# Patient Record
Sex: Female | Born: 1935 | Race: White | Hispanic: No | Smoking: Never smoker
Health system: Southern US, Community
[De-identification: ages and names within clinical notes are randomized; demographics above are authoritative.]

## PROBLEM LIST (undated history)

## (undated) DIAGNOSIS — Z87442 Personal history of urinary calculi: Secondary | ICD-10-CM

## (undated) DIAGNOSIS — R29898 Other symptoms and signs involving the musculoskeletal system: Secondary | ICD-10-CM

## (undated) DIAGNOSIS — H9193 Unspecified hearing loss, bilateral: Secondary | ICD-10-CM

## (undated) DIAGNOSIS — R2681 Unsteadiness on feet: Secondary | ICD-10-CM

## (undated) DIAGNOSIS — I639 Cerebral infarction, unspecified: Secondary | ICD-10-CM

## (undated) DIAGNOSIS — C44621 Squamous cell carcinoma of skin of unspecified upper limb, including shoulder: Secondary | ICD-10-CM

## (undated) DIAGNOSIS — S22070A Wedge compression fracture of T9-T10 vertebra, initial encounter for closed fracture: Secondary | ICD-10-CM

## (undated) DIAGNOSIS — I2510 Atherosclerotic heart disease of native coronary artery without angina pectoris: Secondary | ICD-10-CM

## (undated) DIAGNOSIS — K219 Gastro-esophageal reflux disease without esophagitis: Secondary | ICD-10-CM

## (undated) DIAGNOSIS — S72001A Fracture of unspecified part of neck of right femur, initial encounter for closed fracture: Secondary | ICD-10-CM

## (undated) DIAGNOSIS — M48061 Spinal stenosis, lumbar region without neurogenic claudication: Secondary | ICD-10-CM

## (undated) DIAGNOSIS — J309 Allergic rhinitis, unspecified: Secondary | ICD-10-CM

## (undated) DIAGNOSIS — N189 Chronic kidney disease, unspecified: Secondary | ICD-10-CM

## (undated) DIAGNOSIS — H353190 Nonexudative age-related macular degeneration, unspecified eye, stage unspecified: Secondary | ICD-10-CM

## (undated) DIAGNOSIS — I5030 Unspecified diastolic (congestive) heart failure: Secondary | ICD-10-CM

## (undated) DIAGNOSIS — R0689 Other abnormalities of breathing: Secondary | ICD-10-CM

## (undated) DIAGNOSIS — E785 Hyperlipidemia, unspecified: Secondary | ICD-10-CM

## (undated) DIAGNOSIS — Z8719 Personal history of other diseases of the digestive system: Secondary | ICD-10-CM

## (undated) DIAGNOSIS — R55 Syncope and collapse: Secondary | ICD-10-CM

## (undated) DIAGNOSIS — G250 Essential tremor: Secondary | ICD-10-CM

## (undated) DIAGNOSIS — Z8619 Personal history of other infectious and parasitic diseases: Secondary | ICD-10-CM

## (undated) DIAGNOSIS — B0230 Zoster ocular disease, unspecified: Secondary | ICD-10-CM

## (undated) DIAGNOSIS — I10 Essential (primary) hypertension: Secondary | ICD-10-CM

## (undated) HISTORY — DX: Other abnormalities of breathing: R06.89

## (undated) HISTORY — DX: Atherosclerotic heart disease of native coronary artery without angina pectoris: I25.10

## (undated) HISTORY — DX: Squamous cell carcinoma of skin of unspecified upper limb, including shoulder: C44.621

## (undated) HISTORY — DX: Gastro-esophageal reflux disease without esophagitis: K21.9

## (undated) HISTORY — PX: CORNEAL TRANSPLANT: SHX108

## (undated) HISTORY — DX: Essential (primary) hypertension: I10

## (undated) HISTORY — DX: Wedge compression fracture of t9-t10 vertebra, initial encounter for closed fracture: S22.070A

## (undated) HISTORY — DX: Personal history of urinary calculi: Z87.442

## (undated) HISTORY — DX: Fracture of unspecified part of neck of right femur, initial encounter for closed fracture: S72.001A

## (undated) HISTORY — DX: Essential tremor: G25.0

## (undated) HISTORY — DX: Unspecified diastolic (congestive) heart failure: I50.30

## (undated) HISTORY — DX: Syncope and collapse: R55

## (undated) HISTORY — DX: Cerebral infarction, unspecified: I63.9

## (undated) HISTORY — DX: Unsteadiness on feet: R26.81

## (undated) HISTORY — DX: Unspecified hearing loss, bilateral: H91.93

## (undated) HISTORY — DX: Hyperlipidemia, unspecified: E78.5

## (undated) HISTORY — DX: Allergic rhinitis, unspecified: J30.9

## (undated) HISTORY — DX: Nonexudative age-related macular degeneration, unspecified eye, stage unspecified: H35.3190

## (undated) HISTORY — DX: Spinal stenosis, lumbar region without neurogenic claudication: M48.061

## (undated) HISTORY — DX: Other symptoms and signs involving the musculoskeletal system: R29.898

## (undated) HISTORY — PX: CATARACT EXTRACTION: SUR2

## (undated) HISTORY — DX: Chronic kidney disease, unspecified: N18.9

## (undated) HISTORY — DX: Personal history of other infectious and parasitic diseases: Z86.19

## (undated) HISTORY — DX: Personal history of other diseases of the digestive system: Z87.19

## (undated) HISTORY — DX: Zoster ocular disease, unspecified: B02.30

---
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
## 1983-02-02 HISTORY — PX: CHOLECYSTECTOMY: SHX55

## 1997-12-23 ENCOUNTER — Other Ambulatory Visit: Admission: RE | Admit: 1997-12-23 | Discharge: 1997-12-23 | Payer: Self-pay | Admitting: Gynecology

## 1999-01-19 ENCOUNTER — Other Ambulatory Visit: Admission: RE | Admit: 1999-01-19 | Discharge: 1999-01-19 | Payer: Self-pay | Admitting: Gynecology

## 1999-01-27 ENCOUNTER — Encounter: Admission: RE | Admit: 1999-01-27 | Discharge: 1999-01-27 | Payer: Self-pay | Admitting: Gynecology

## 1999-01-27 ENCOUNTER — Encounter: Payer: Self-pay | Admitting: Gynecology

## 1999-02-02 HISTORY — PX: BREAST BIOPSY: SHX20

## 1999-03-09 ENCOUNTER — Encounter (INDEPENDENT_AMBULATORY_CARE_PROVIDER_SITE_OTHER): Payer: Self-pay | Admitting: Specialist

## 1999-03-09 ENCOUNTER — Ambulatory Visit (HOSPITAL_COMMUNITY): Admission: RE | Admit: 1999-03-09 | Discharge: 1999-03-09 | Payer: Self-pay | Admitting: Surgery

## 1999-03-09 ENCOUNTER — Encounter: Payer: Self-pay | Admitting: Surgery

## 2000-02-02 HISTORY — PX: CERVICAL FUSION: SHX112

## 2000-02-08 ENCOUNTER — Other Ambulatory Visit: Admission: RE | Admit: 2000-02-08 | Discharge: 2000-02-08 | Payer: Self-pay | Admitting: Gynecology

## 2000-04-22 ENCOUNTER — Encounter: Admission: RE | Admit: 2000-04-22 | Discharge: 2000-04-22 | Payer: Self-pay | Admitting: Gynecology

## 2000-04-22 ENCOUNTER — Encounter: Payer: Self-pay | Admitting: Gynecology

## 2000-08-25 ENCOUNTER — Encounter: Payer: Self-pay | Admitting: Neurosurgery

## 2000-08-25 ENCOUNTER — Inpatient Hospital Stay (HOSPITAL_COMMUNITY): Admission: RE | Admit: 2000-08-25 | Discharge: 2000-08-27 | Payer: Self-pay | Admitting: Neurosurgery

## 2000-09-01 ENCOUNTER — Encounter: Admission: RE | Admit: 2000-09-01 | Discharge: 2000-09-01 | Payer: Self-pay | Admitting: Neurosurgery

## 2000-09-01 ENCOUNTER — Encounter: Payer: Self-pay | Admitting: Neurosurgery

## 2001-02-15 ENCOUNTER — Other Ambulatory Visit: Admission: RE | Admit: 2001-02-15 | Discharge: 2001-02-15 | Payer: Self-pay | Admitting: Gynecology

## 2001-05-15 ENCOUNTER — Encounter: Admission: RE | Admit: 2001-05-15 | Discharge: 2001-05-15 | Payer: Self-pay | Admitting: Gynecology

## 2001-05-15 ENCOUNTER — Encounter: Payer: Self-pay | Admitting: Gynecology

## 2001-07-25 ENCOUNTER — Encounter: Payer: Self-pay | Admitting: Gynecology

## 2001-07-25 ENCOUNTER — Encounter: Admission: RE | Admit: 2001-07-25 | Discharge: 2001-07-25 | Payer: Self-pay | Admitting: Gynecology

## 2002-02-01 DIAGNOSIS — K219 Gastro-esophageal reflux disease without esophagitis: Secondary | ICD-10-CM

## 2002-02-01 HISTORY — PX: CORONARY ARTERY BYPASS GRAFT: SHX141

## 2002-02-01 HISTORY — DX: Gastro-esophageal reflux disease without esophagitis: K21.9

## 2002-03-12 ENCOUNTER — Encounter: Admission: RE | Admit: 2002-03-12 | Discharge: 2002-03-12 | Payer: Self-pay | Admitting: Gynecology

## 2002-03-12 ENCOUNTER — Encounter: Payer: Self-pay | Admitting: Gynecology

## 2002-05-02 ENCOUNTER — Ambulatory Visit (HOSPITAL_COMMUNITY): Admission: RE | Admit: 2002-05-02 | Discharge: 2002-05-02 | Payer: Self-pay | Admitting: Cardiology

## 2002-05-21 ENCOUNTER — Inpatient Hospital Stay (HOSPITAL_COMMUNITY): Admission: EM | Admit: 2002-05-21 | Discharge: 2002-05-22 | Payer: Self-pay | Admitting: Emergency Medicine

## 2002-05-21 ENCOUNTER — Encounter: Payer: Self-pay | Admitting: Cardiology

## 2002-08-20 ENCOUNTER — Encounter (HOSPITAL_COMMUNITY): Admission: RE | Admit: 2002-08-20 | Discharge: 2002-11-18 | Payer: Self-pay | Admitting: Cardiology

## 2002-09-12 ENCOUNTER — Encounter: Payer: Self-pay | Admitting: Endocrinology

## 2002-09-12 ENCOUNTER — Encounter: Admission: RE | Admit: 2002-09-12 | Discharge: 2002-09-12 | Payer: Self-pay | Admitting: Endocrinology

## 2003-01-04 ENCOUNTER — Ambulatory Visit (HOSPITAL_COMMUNITY): Admission: RE | Admit: 2003-01-04 | Discharge: 2003-01-05 | Payer: Self-pay | Admitting: Cardiology

## 2003-02-02 DIAGNOSIS — R55 Syncope and collapse: Secondary | ICD-10-CM

## 2003-02-02 HISTORY — PX: OTHER SURGICAL HISTORY: SHX169

## 2003-02-02 HISTORY — DX: Syncope and collapse: R55

## 2003-02-02 HISTORY — PX: PERCUTANEOUS CORONARY STENT INTERVENTION (PCI-S): SHX6016

## 2003-02-28 ENCOUNTER — Other Ambulatory Visit: Admission: RE | Admit: 2003-02-28 | Discharge: 2003-02-28 | Payer: Self-pay | Admitting: Gynecology

## 2003-03-01 ENCOUNTER — Ambulatory Visit (HOSPITAL_COMMUNITY): Admission: RE | Admit: 2003-03-01 | Discharge: 2003-03-01 | Payer: Self-pay | Admitting: Cardiology

## 2003-03-02 ENCOUNTER — Inpatient Hospital Stay (HOSPITAL_COMMUNITY): Admission: EM | Admit: 2003-03-02 | Discharge: 2003-03-05 | Payer: Self-pay | Admitting: Emergency Medicine

## 2003-03-13 ENCOUNTER — Encounter: Admission: RE | Admit: 2003-03-13 | Discharge: 2003-03-13 | Payer: Self-pay | Admitting: Gynecology

## 2003-04-04 ENCOUNTER — Encounter: Admission: RE | Admit: 2003-04-04 | Discharge: 2003-05-16 | Payer: Self-pay | Admitting: Neurology

## 2003-08-09 ENCOUNTER — Emergency Department (HOSPITAL_COMMUNITY): Admission: EM | Admit: 2003-08-09 | Discharge: 2003-08-09 | Payer: Self-pay | Admitting: Family Medicine

## 2003-10-10 ENCOUNTER — Emergency Department (HOSPITAL_COMMUNITY): Admission: EM | Admit: 2003-10-10 | Discharge: 2003-10-10 | Payer: Self-pay | Admitting: Family Medicine

## 2004-01-02 ENCOUNTER — Encounter: Admission: RE | Admit: 2004-01-02 | Discharge: 2004-01-02 | Payer: Self-pay | Admitting: Cardiology

## 2004-01-08 ENCOUNTER — Ambulatory Visit (HOSPITAL_COMMUNITY): Admission: RE | Admit: 2004-01-08 | Discharge: 2004-01-09 | Payer: Self-pay | Admitting: Cardiology

## 2004-02-20 ENCOUNTER — Emergency Department (HOSPITAL_COMMUNITY): Admission: EM | Admit: 2004-02-20 | Discharge: 2004-02-21 | Payer: Self-pay

## 2004-03-04 ENCOUNTER — Encounter (HOSPITAL_COMMUNITY): Admission: RE | Admit: 2004-03-04 | Discharge: 2004-06-02 | Payer: Self-pay | Admitting: Cardiology

## 2004-05-19 ENCOUNTER — Encounter: Admission: RE | Admit: 2004-05-19 | Discharge: 2004-05-19 | Payer: Self-pay | Admitting: Gynecology

## 2004-06-03 ENCOUNTER — Encounter (HOSPITAL_COMMUNITY): Admission: RE | Admit: 2004-06-03 | Discharge: 2004-09-01 | Payer: Self-pay | Admitting: Cardiology

## 2004-06-15 ENCOUNTER — Inpatient Hospital Stay (HOSPITAL_COMMUNITY): Admission: EM | Admit: 2004-06-15 | Discharge: 2004-06-16 | Payer: Self-pay | Admitting: Emergency Medicine

## 2004-09-02 ENCOUNTER — Encounter (HOSPITAL_COMMUNITY): Admission: RE | Admit: 2004-09-02 | Discharge: 2004-12-01 | Payer: Self-pay | Admitting: Cardiology

## 2004-10-11 ENCOUNTER — Emergency Department (HOSPITAL_COMMUNITY): Admission: EM | Admit: 2004-10-11 | Discharge: 2004-10-11 | Payer: Self-pay | Admitting: Family Medicine

## 2004-12-02 ENCOUNTER — Encounter (HOSPITAL_COMMUNITY): Admission: RE | Admit: 2004-12-02 | Discharge: 2005-03-02 | Payer: Self-pay | Admitting: Cardiology

## 2005-06-03 ENCOUNTER — Encounter: Admission: RE | Admit: 2005-06-03 | Discharge: 2005-06-03 | Payer: Self-pay | Admitting: Endocrinology

## 2005-09-11 ENCOUNTER — Emergency Department (HOSPITAL_COMMUNITY): Admission: EM | Admit: 2005-09-11 | Discharge: 2005-09-11 | Payer: Self-pay | Admitting: Family Medicine

## 2006-03-28 ENCOUNTER — Encounter: Admission: RE | Admit: 2006-03-28 | Discharge: 2006-03-28 | Payer: Self-pay | Admitting: Endocrinology

## 2006-04-11 ENCOUNTER — Encounter (INDEPENDENT_AMBULATORY_CARE_PROVIDER_SITE_OTHER): Payer: Self-pay | Admitting: *Deleted

## 2006-04-11 ENCOUNTER — Ambulatory Visit (HOSPITAL_BASED_OUTPATIENT_CLINIC_OR_DEPARTMENT_OTHER): Admission: RE | Admit: 2006-04-11 | Discharge: 2006-04-11 | Payer: Self-pay | Admitting: Surgery

## 2006-07-26 ENCOUNTER — Encounter: Admission: RE | Admit: 2006-07-26 | Discharge: 2006-07-26 | Payer: Self-pay | Admitting: Obstetrics and Gynecology

## 2006-09-07 ENCOUNTER — Encounter: Admission: RE | Admit: 2006-09-07 | Discharge: 2006-09-07 | Payer: Self-pay | Admitting: Neurosurgery

## 2006-10-02 ENCOUNTER — Encounter: Admission: RE | Admit: 2006-10-02 | Discharge: 2006-10-02 | Payer: Self-pay | Admitting: Neurosurgery

## 2007-02-02 DIAGNOSIS — B0230 Zoster ocular disease, unspecified: Secondary | ICD-10-CM

## 2007-02-02 HISTORY — DX: Zoster ocular disease, unspecified: B02.30

## 2007-02-02 HISTORY — PX: CARDIAC CATHETERIZATION: SHX172

## 2007-08-14 ENCOUNTER — Encounter: Admission: RE | Admit: 2007-08-14 | Discharge: 2007-08-14 | Payer: Self-pay | Admitting: Endocrinology

## 2007-08-22 ENCOUNTER — Encounter: Admission: RE | Admit: 2007-08-22 | Discharge: 2007-08-22 | Payer: Self-pay | Admitting: Endocrinology

## 2007-10-23 ENCOUNTER — Encounter: Payer: Self-pay | Admitting: Cardiovascular Disease

## 2008-02-02 HISTORY — PX: LUMBAR SPINE SURGERY: SHX701

## 2008-02-02 HISTORY — PX: CARDIOVASCULAR STRESS TEST: SHX262

## 2008-03-11 ENCOUNTER — Encounter: Admission: RE | Admit: 2008-03-11 | Discharge: 2008-03-11 | Payer: Self-pay | Admitting: Neurosurgery

## 2008-05-24 ENCOUNTER — Inpatient Hospital Stay (HOSPITAL_COMMUNITY): Admission: RE | Admit: 2008-05-24 | Discharge: 2008-05-28 | Payer: Self-pay | Admitting: Neurosurgery

## 2008-09-07 ENCOUNTER — Emergency Department: Payer: Self-pay | Admitting: Unknown Physician Specialty

## 2009-02-01 DIAGNOSIS — R0689 Other abnormalities of breathing: Secondary | ICD-10-CM

## 2009-02-01 HISTORY — DX: Other abnormalities of breathing: R06.89

## 2009-05-05 LAB — VITAMIN D 25 HYDROXY (VIT D DEFICIENCY, FRACTURES): Vit D, 25-Hydroxy: 39.1

## 2009-07-04 ENCOUNTER — Encounter: Admission: RE | Admit: 2009-07-04 | Discharge: 2009-07-04 | Payer: Self-pay | Admitting: Endocrinology

## 2009-09-10 ENCOUNTER — Encounter: Payer: Self-pay | Admitting: Cardiovascular Disease

## 2009-09-11 ENCOUNTER — Ambulatory Visit (HOSPITAL_COMMUNITY): Admission: RE | Admit: 2009-09-11 | Discharge: 2009-09-11 | Payer: Self-pay | Admitting: Cardiology

## 2009-09-11 ENCOUNTER — Ambulatory Visit: Payer: Self-pay | Admitting: Cardiology

## 2009-09-15 ENCOUNTER — Telehealth (INDEPENDENT_AMBULATORY_CARE_PROVIDER_SITE_OTHER): Payer: Self-pay | Admitting: *Deleted

## 2009-09-16 ENCOUNTER — Ambulatory Visit: Payer: Self-pay

## 2009-09-16 ENCOUNTER — Encounter: Payer: Self-pay | Admitting: Internal Medicine

## 2009-09-16 ENCOUNTER — Encounter (HOSPITAL_COMMUNITY): Admission: RE | Admit: 2009-09-16 | Discharge: 2009-10-20 | Payer: Self-pay | Admitting: Cardiology

## 2009-09-16 ENCOUNTER — Ambulatory Visit: Payer: Self-pay | Admitting: Internal Medicine

## 2009-09-16 ENCOUNTER — Encounter: Payer: Self-pay | Admitting: Cardiovascular Disease

## 2009-09-23 ENCOUNTER — Ambulatory Visit: Payer: Self-pay | Admitting: Cardiology

## 2009-10-03 ENCOUNTER — Ambulatory Visit: Payer: Self-pay | Admitting: Emergency Medicine

## 2009-10-03 DIAGNOSIS — E1121 Type 2 diabetes mellitus with diabetic nephropathy: Secondary | ICD-10-CM

## 2009-10-03 DIAGNOSIS — R0602 Shortness of breath: Secondary | ICD-10-CM

## 2009-10-03 DIAGNOSIS — I10 Essential (primary) hypertension: Secondary | ICD-10-CM | POA: Insufficient documentation

## 2009-10-09 ENCOUNTER — Encounter: Payer: Self-pay | Admitting: Emergency Medicine

## 2009-10-09 ENCOUNTER — Ambulatory Visit (HOSPITAL_COMMUNITY): Admission: RE | Admit: 2009-10-09 | Discharge: 2009-10-09 | Payer: Self-pay | Admitting: Emergency Medicine

## 2009-10-10 ENCOUNTER — Telehealth (INDEPENDENT_AMBULATORY_CARE_PROVIDER_SITE_OTHER): Payer: Self-pay | Admitting: *Deleted

## 2009-10-30 ENCOUNTER — Ambulatory Visit: Payer: Self-pay | Admitting: Emergency Medicine

## 2009-10-30 DIAGNOSIS — Z8673 Personal history of transient ischemic attack (TIA), and cerebral infarction without residual deficits: Secondary | ICD-10-CM

## 2009-11-03 ENCOUNTER — Encounter: Payer: Self-pay | Admitting: Emergency Medicine

## 2009-11-03 ENCOUNTER — Ambulatory Visit: Payer: Self-pay | Admitting: Cardiology

## 2009-11-03 LAB — HEMOGLOBIN A1C: Hgb A1c MFr Bld: 6.7 % — AB (ref 4.0–6.0)

## 2009-11-10 ENCOUNTER — Telehealth (INDEPENDENT_AMBULATORY_CARE_PROVIDER_SITE_OTHER): Payer: Self-pay | Admitting: *Deleted

## 2009-11-24 ENCOUNTER — Telehealth (INDEPENDENT_AMBULATORY_CARE_PROVIDER_SITE_OTHER): Payer: Self-pay | Admitting: *Deleted

## 2009-11-25 ENCOUNTER — Encounter: Payer: Self-pay | Admitting: Emergency Medicine

## 2009-12-03 ENCOUNTER — Encounter: Payer: Self-pay | Admitting: Emergency Medicine

## 2009-12-10 ENCOUNTER — Ambulatory Visit: Payer: Self-pay | Admitting: Cardiology

## 2009-12-10 ENCOUNTER — Encounter: Payer: Self-pay | Admitting: Cardiovascular Disease

## 2009-12-11 ENCOUNTER — Ambulatory Visit: Payer: Self-pay | Admitting: Emergency Medicine

## 2009-12-11 DIAGNOSIS — R0902 Hypoxemia: Secondary | ICD-10-CM

## 2009-12-16 ENCOUNTER — Encounter: Payer: Self-pay | Admitting: Emergency Medicine

## 2009-12-17 ENCOUNTER — Telehealth: Payer: Self-pay | Admitting: Emergency Medicine

## 2010-01-02 ENCOUNTER — Telehealth (INDEPENDENT_AMBULATORY_CARE_PROVIDER_SITE_OTHER): Payer: Self-pay | Admitting: *Deleted

## 2010-01-07 ENCOUNTER — Encounter: Payer: Self-pay | Admitting: Emergency Medicine

## 2010-01-07 LAB — CBC AND DIFFERENTIAL
Hemoglobin: 12 g/dL (ref 12.0–16.0)
MCV: 94.1
Platelets: 255 10*3/uL
WBC: 6.9

## 2010-01-07 LAB — COMPREHENSIVE METABOLIC PANEL
AST: 15 U/L
Albumin: 4
Alkaline Phosphatase: 40 U/L
Glucose: 92
Potassium, serum: 4
Total bilirubin, fluid: 0.6

## 2010-01-07 LAB — LIPID PANEL
Direct LDL: 55
HDL: 70 mg/dL (ref 35–70)

## 2010-01-29 ENCOUNTER — Telehealth (INDEPENDENT_AMBULATORY_CARE_PROVIDER_SITE_OTHER): Payer: Self-pay | Admitting: *Deleted

## 2010-02-10 ENCOUNTER — Encounter: Payer: Self-pay | Admitting: Emergency Medicine

## 2010-03-02 ENCOUNTER — Ambulatory Visit
Admission: RE | Admit: 2010-03-02 | Discharge: 2010-03-02 | Payer: Self-pay | Source: Home / Self Care | Attending: Cardiovascular Disease | Admitting: Cardiovascular Disease

## 2010-03-02 ENCOUNTER — Encounter: Payer: Self-pay | Admitting: Cardiovascular Disease

## 2010-03-02 DIAGNOSIS — E785 Hyperlipidemia, unspecified: Secondary | ICD-10-CM | POA: Insufficient documentation

## 2010-03-03 ENCOUNTER — Telehealth: Payer: Self-pay | Admitting: Emergency Medicine

## 2010-03-03 NOTE — Progress Notes (Signed)
Summary: liquid O2 rx  Phone Note From Other Clinic Call back at [phone]   Caller: casey[facility] Call For: byrum Summary of Call: Needs rx for liquid O2. Initial call taken by: Darletta Moll,  November 10, 2009 10:41 AM  Follow-up for Phone Call        called and spoke with [facility] from [facility]. They are requesting a rx from RB for Liquid o2 and portable o2, and nasal canulas.  Also requests liter flow documented in rx.  RB sent order to [facility] 10/30/2009 but this just stated for o2 set up.  Will forward message to RB to address.  Aundra Millet Reynolds LPN  November 10, 2009 10:59 AM   Additional Follow-up for Phone Call Additional follow up Details #1::        Order for liquid, portable O2 sent. Leslye Peer MD  November 11, 2009 12:48 PM  Additional Follow-up by: Leslye Peer MD,  November 11, 2009 12:48 PM    Additional Follow-up for Phone Call Additional follow up Details #2::    Order faxed to [facility] .Kandice Hams [facility]  November 11, 2009 2:17 PM  Follow-up by: Kandice Hams CMA,  November 11, 2009 2:17 PM

## 2010-03-03 NOTE — Progress Notes (Signed)
Summary: ONO results from [facility]--  Phone Note Call from Patient Call back at [facility] Phone [phone]   Caller: Patient Call For: Edder Bellanca Summary of Call: ONO results Initial call taken by: Tivis Ringer, CNA,  December 17, 2009 10:55 AM  Follow-up for Phone Call        Do we have these results available? Leslye Peer MD  December 18, 2009 12:56 PM   Pt has changed DME companies and is now with [facility]. I spoke with them and they are faxing ONO results to Dr. Delton Coombes.Michel Bickers [facility]  December 18, 2009 2:13 PM  Results receivedand placed in RB look at folder. Follow-up by: Michel Bickers CMA,  December 18, 2009 2:53 PM

## 2010-03-03 NOTE — Progress Notes (Signed)
Summary: appt w/ RB  Phone Note Call from Patient Call back at Home Phone [phone]   Caller: Patient Call For: byrum Summary of Call: pt called to make a f/u for results. i gave her next avail appt of 9/29. she wants to make sure that's not "too far out" for a f/u.  Initial call taken by: Tivis Ringer, CNA,  October 10, 2009 3:48 PM  Follow-up for Phone Call        Per last OV note from 9.2.11 with RB, instructions were for pt to f/u first available after tests to discuss results.    Called, spoke with pt.  Informed her RB is out of the office for the next 2 wks and returns the week of Sept 26 and per instructions he said f/u first available so this will not be too long to wait.  She verbalized understanding.   Follow-up by: Gweneth Dimitri RN,  October 10, 2009 3:54 PM

## 2010-03-03 NOTE — Progress Notes (Signed)
Summary: nuc pre procedure  Phone Note Outgoing Call   Call placed by: CHasspacher,RN Call placed to: Patient Action Taken: Phone Call Completed Reason for Call: Confirm/change Appt Summary of Call: .nuccall     Nuclear Med Background Indications for Stress Test: Evaluation for Ischemia, Graft Patency, Stent Patency   History: CABG, Heart Catheterization, Myocardial Perfusion Study, Stents  History Comments: 04 CABG x2 04 & 05 Stents int,lad,diag 06 Cath patent stents & patent SVG-Diag 03/10 MPS normal  Symptoms: Chest Pain, SOB    Nuclear Pre-Procedure Cardiac Risk Factors: Hypertension, IDDM Type 1, IDDM Type 2, Lipids

## 2010-03-03 NOTE — Letter (Signed)
Summary: Veverly Fells Altheimer MD  Veverly Fells Altheimer MD   Imported By: Lester Bloomfield 11/28/2009 07:37:24  _____________________________________________________________________  External Attachment:    Type:   Image     Comment:   External Document

## 2010-03-03 NOTE — Assessment & Plan Note (Signed)
Summary: dyspnea   Visit Type:  Follow-up Copy to:  Dr. Deborah Chalk Primary Provider/Referring Provider:  Dr. Joyce Gross  CC:  Followup dyspnea.  Pt states that her breathing is about the same- no better or worse.  No new complaints today.  Currently taking round of factive.Marland Kitchen  History of Present Illness: 77 yo woman, never smoker, CAD/CABG, HTN, DM, CVA, OA. Followed by Dr Deborah Chalk for her CAD, HTN.    Referred for evaluation of recent worsening in dyspnea. Has been evaluated with Lexiscan, showed mild progression of her CAD (no intervention recommended), also PFT's restriction vs mixed disease. In retrospect she reports slow progressive SOB, worse with exertion. Also talking. Sleeps sitting up because she feels a pressure in her chest when supine. Gets waked up at night. She snores some. Having hoarse voice, dyspnea with talking. Has nasal gtt, recent rx for sinusitis.   ROV 10/30/09 -- returns for dyspnea with restriction/mixed disease. CXR without infiltrates last visit. MIP and MEP performed, both decreased (40 - 46% pred). Her breathing is unchanged since last visit, doesn't believe she has the energy to do the work she wants to do. This seems to be related to weakness as opposed to overt dyspnea. FEV1 65% predicted.   Current Medications (verified): 1)  Fortamet 1000 Mg Xr24h-Tab (Metformin Hcl) .... Once Daily After Dinner 2)  Januvia 100 Mg Tabs (Sitagliptin Phosphate) .... Once Daily in Am 3)  Plavix 75 Mg Tabs (Clopidogrel Bisulfate) .... Once Daily 4)  Aciphex 20 Mg Tbec (Rabeprazole Sodium) .... Once Daily 5)  Lexapro 20 Mg Tabs (Escitalopram Oxalate) .... Once Daily 6)  Ranexa 1000 Mg Xr12h-Tab (Ranolazine) .Marland Kitchen.. 1 Two Times A Day 7)  Lipitor 20 Mg Tabs (Atorvastatin Calcium) .... Once Daily 8)  Zetia 10 Mg Tabs (Ezetimibe) .... Once Daily 9)  Antara 130 Mg Caps (Fenofibrate Micronized) .... Once Daily 10)  Lantus 100 Unit/ml Soln (Insulin Glargine) .... 20 Units in The Am 11)  Bayer  Aspirin 325 Mg Tabs (Aspirin) .Marland Kitchen.. 1 Once Daily 12)  Mirtazapine 15 Mg Tabs (Mirtazapine) .... Once Daily 13)  Vitamin D (Ergocalciferol) 50000 Unit Caps (Ergocalciferol) .... Once A Week 14)  Nitrostat 0.4 Mg Subl (Nitroglycerin) .... As Needed 15)  Isosorbide Mononitrate Cr 60 Mg Xr24h-Tab (Isosorbide Mononitrate) .Marland Kitchen.. 1 1/2 Two Times A Day 16)  Multivitamins  Tabs (Multiple Vitamin) .Marland Kitchen.. 1 Once Daily 17)  Flonase 50 Mcg/act Susp (Fluticasone Propionate) .... 2 Sprays Each Nostril Once Daily  Allergies (verified): 1)  ! Codeine 2)  ! * Actos 3)  ! Morphine  Vital Signs:  Patient profile:   77 year old female Weight:      156.50 pounds O2 Sat:      93 % on Room air Temp:     98.3 degrees F oral Pulse rate:   85 / minute BP sitting:   110 / 66  (left arm)  Vitals Entered By: Vernie Murders (October 30, 2009 3:10 PM)  O2 Flow:  Room air  Serial Vital Signs/Assessments:  Comments: 3:58 PM Ambulatory Pulse Oximetry  Resting; HR__83___    02 Sat__93%ra___  Lap1 (185 feet)   HR__93___   02 Sat__88%ra___ Lap2 (185 feet)   HR_____   02 Sat_____    Lap3 (185 feet)   HR_____   02 Sat_____  ___Test Completed without Difficulty _x__Test Stopped due to: decreased o2 sat- sat dropped to 88%ra and increased to 99% after being placed on 2lpm   By: Vernie Murders  Physical Exam  General:  pleasant overwt elderly woman, hoarse voice Head:  normocephalic and atraumatic Eyes:  conjunctiva and sclera clear Nose:  no deformity, discharge, inflammation, or lesions Mouth:  no deformity or lesions Neck:  no masses, thyromegaly, or abnormal cervical nodes, no stridor Lungs:  B basilar insp crackles Heart:  regular rate and rhythm, S1, S2 without murmurs, rubs, gallops, or clicks Abdomen:  not examined Msk:  no deformity or scoliosis noted with normal posture Extremities:  trace to 1+ LE edema Neurologic:  non-focal Skin:  intact without lesions or rashes Psych:  alert and  cooperative; normal mood and affect; normal attention span and concentration   Impression & Recommendations:  Problem # 1:  DYSPNEA (ICD-786.05)  Etiology unclear, CXR clear but PFTs suggest mixed restriction (? due to truncal obesity) and obstruction. MIP and MEP are effort dependent, but could consider neuromuscular weakness.  - walking oximetry today - trial SABA to see if she benefits - rov 6 weeks  Orders: Est. Patient Level IV ([address]109) Prescription Created Electronically [phone]) DME Referral (DME)  Problem # 2:  HYPERTENSION (ICD-401.9)  Problem # 3:  CAD (ICD-414.00)  Her updated medication list for this problem includes:    Plavix 75 Mg Tabs (Clopidogrel bisulfate) ..... Once daily    Ranexa 1000 Mg Xr12h-tab (Ranolazine) .Marland Kitchen... 1 two times a day    Bayer Aspirin 325 Mg Tabs (Aspirin) .Marland Kitchen... 1 once daily    Nitrostat 0.4 Mg Subl (Nitroglycerin) .Marland Kitchen... As needed    Isosorbide Mononitrate Cr 60 Mg Xr24h-tab (Isosorbide mononitrate) .Marland Kitchen... 1 1/2 two times a day  Problem # 4:  CVA (ICD-434.91)  Her updated medication list for this problem includes:    Plavix 75 Mg Tabs (Clopidogrel bisulfate) ..... Once daily    Bayer Aspirin 325 Mg Tabs (Aspirin) .Marland Kitchen... 1 once daily  Medications Added to Medication List This Visit: 1)  Ranexa 1000 Mg Xr12h-tab (Ranolazine) .Marland Kitchen.. 1 two times a day 2)  Bayer Aspirin 325 Mg Tabs (Aspirin) .Marland Kitchen.. 1 once daily 3)  Isosorbide Mononitrate Cr 60 Mg Xr24h-tab (Isosorbide mononitrate) .Marland Kitchen.. 1 1/2 two times a day 4)  Multivitamins Tabs (Multiple vitamin) .Marland Kitchen.. 1 once daily 5)  Flonase 50 Mcg/act Susp (Fluticasone propionate) .... 2 sprays each nostril once daily 6)  Ventolin Hfa 108 (90 Base) Mcg/act Aers (Albuterol sulfate) .... 2 puffs q4h as needed sob    Patient Instructions: 1)  Walking oximetry today showed that you may benefit from oxygen when you exert yourself.  2)  We will try an inhaled medication to see if this helps your breathing. Use  Ventolin 2 puffs up to every 4 hours if needed for shortness of breath.  3)  Follow up with Dr Delton Coombes in 6 weeks to review your symptoms Prescriptions: VENTOLIN HFA 108 (90 BASE) MCG/ACT AERS (ALBUTEROL SULFATE) 2 puffs q4h as needed SOB  #1 x 5   Entered and Authorized by:   Leslye Peer MD   Signed by:   Leslye Peer MD on 10/30/2009   Method used:   Electronically to        [id number] (retail)       [address]       [place], [place]  [address]       Ph: [phone]       Fax: [phone]   RxID:   [id number]

## 2010-03-03 NOTE — Assessment & Plan Note (Signed)
Summary: dyspnea   Visit Type:  Initial Consult Copy to:  Dr. Deborah Chalk  CC:  Pulmonary consult fo abnormal PFT's..  History of Present Illness: 77 yo woman, never smoker, CAD/CABG, HTN, DM, CVA, OA. Followed by Dr Deborah Chalk for her CAD, HTN.    Referred for evaluation of recent worsening in dyspnea. Has been evaluated with Lexiscan, showed mild progression of her CAD (no intervention recommended), also PFT's as below. In retrospect she reports slow progressive SOB, worse with exertion. Also talking. Sleeps sitting up because she feels a pressure in her chest when supine. Gets waked up at night. She snores some. Having hoarse voice, dyspnea with talking. Has nasal gtt, recent rx for sinusitis. No CXR yet.   Preventive Screening-Counseling & Management  Alcohol-Tobacco     Smoking Status: never  Allergies (verified): 1)  ! Codeine 2)  ! * Actos 3)  ! Morphine  Past History:  Past Medical History: Hypertension Diabetes, Type 2 Allergy problems sinus problems high cholesterol  Past Surgical History: Cholecystectomy discotomy cervical inffusion Heart Bypass Stents--2 3 cornial transplants  Family History: Heart disease--father Colon cancer--mother bladder cancer--brother  Social History: Widowed Patient never smoked.  children 2 Native of [place], has worked in setting exposed to 2nd hand smoke  Smoking Status:  never  Vital Signs:  Patient profile:   77 year old female Height:      64 inches Weight:      150 pounds O2 Sat:      90 % on Room air Temp:     97.6 degrees F oral Pulse rate:   83 / minute BP sitting:   124 / 80  (right arm) Cuff size:   regular  Vitals Entered By: Carver Fila (October 03, 2009 3:37 PM)  O2 Flow:  Room air CC: Pulmonary consult fo abnormal PFT's. Is Patient Diabetic? Yes Comments meds and allergies updated Phone number updated Carver Fila  October 03, 2009 3:43 PM    Physical Exam  General:  pleasant overwt elderly woman,  hoarse voice Head:  normocephalic and atraumatic Eyes:  conjunctiva and sclera clear Nose:  no deformity, discharge, inflammation, or lesions Mouth:  no deformity or lesions Neck:  no masses, thyromegaly, or abnormal cervical nodes, no stridor Lungs:  B basilar insp crackles Heart:  regular rate and rhythm, S1, S2 without murmurs, rubs, gallops, or clicks Abdomen:  not examined Msk:  no deformity or scoliosis noted with normal posture Extremities:  trace to 1+ LE edema Neurologic:  non-focal Skin:  intact without lesions or rashes Psych:  alert and cooperative; normal mood and affect; normal attention span and concentration   Pulmonary Function Test Date: 09/11/2009 Height (in.): 64 Gender: Female  Pre-Spirometry FVC    Value: 1.56 L/min   Pred: 2.64 L/min     % Pred: 59 % FEV1    Value: 1.28 L     Pred: 1.96 L     % Pred: 65 % FEV1/FVC  Value: 82 %     Pred: 74 %     % Pred: 111 % FEF 25-75  Value: 1.33 L/min   Pred: 1.44 L/min     % Pred: 92 %  Impression & Recommendations:  Problem # 1:  DYSPNEA (ICD-786.05) PFT's limited study but show restriction +/- superimposed obstruction. ? etiology - ILD, CHF, resp musc weakness. No reason for her to be obstructed - non-smoker. ? UA disease a contributor, but symptoms pre-date her voice complaints.  - CXR now,  r/o CHF, elevated HD, ILD, etc.  - Mip/MEP - rov to review  Medications Added to Medication List This Visit: 1)  Fortamet 1000 Mg Xr24h-tab (Metformin hcl) .... Once daily after dinner 2)  Januvia 100 Mg Tabs (Sitagliptin phosphate) .... Once daily in am 3)  Plavix 75 Mg Tabs (Clopidogrel bisulfate) .... Once daily 4)  Aciphex 20 Mg Tbec (Rabeprazole sodium) .... Once daily 5)  Lexapro 20 Mg Tabs (Escitalopram oxalate) .... Once daily 6)  Ranexa 1000 Mg Xr12h-tab (Ranolazine) .... Once daily 7)  Lipitor 20 Mg Tabs (Atorvastatin calcium) .... Once daily 8)  Zetia 10 Mg Tabs (Ezetimibe) .... Once daily 9)  Antara 130 Mg Caps  (Fenofibrate micronized) .... Once daily 10)  Lantus 100 Unit/ml Soln (Insulin glargine) .... 20 units in the am 11)  Aspirin 81 Mg Tabs (Aspirin) .... Once daily 12)  Mirtazapine 15 Mg Tabs (Mirtazapine) .... Once daily 13)  Vitamin D (ergocalciferol) 50000 Unit Caps (Ergocalciferol) .... Once a week 14)  Nitrostat 0.4 Mg Subl (Nitroglycerin) .... As needed 15)  Isosorbide Mononitrate Cr 60 Mg Xr24h-tab (Isosorbide mononitrate) .Marland Kitchen.. 1 1/2 once daily  Other Orders: Consultation Level IV (99244) T-2 View CXR (71020TC)  Patient Instructions: 1)  We will perform CXR today 2)  We will perform maximal inspiratory and expiratory pressures at [facility] 3)  Follow with Dr Delton Coombes next available after your testing to review.    Immunization History:  Influenza Immunization History:    Influenza:  historical (11/01/2008)  Pneumovax Immunization History:    Pneumovax:  historical (11/02/2007)   Appended Document: Orders Update    Clinical Lists Changes  Orders: Added new Referral order of Pulmonary Referral (Pulmonary) - Signed

## 2010-03-03 NOTE — Assessment & Plan Note (Signed)
Summary: Cardiology Nuclear Testing  Nuclear Med Background Indications for Stress Test: Evaluation for Ischemia, Graft Patency, Stent Patency   History: CABG, Heart Catheterization, Myocardial Perfusion Study, Stents  History Comments: 04 CABG x2 04 & 05 Stents int,lad,diag 06 Cath patent stents & patent SVG-Diag 03/10 MPS normal  Symptoms: Chest Pain, Chest Tightness, Chest Tightness with Exertion, Dizziness, DOE, Fatigue, Fatigue with Exertion, Light-Headedness, Nausea, Near Syncope, SOB  Symptoms Comments: Radiates between shoulderblades.   Nuclear Pre-Procedure Cardiac Risk Factors: CVA, Family History - CAD, Hypertension, IDDM Type 2, Lipids Caffeine/Decaff Intake: 1pm NPO After: 10:30 PM Lungs: Clear IV 0.9% NS with Angio Cath: 22g     IV Site: R hand IV Started by: Darrick Penna Cup Size 38DD     Height (in): 64 Weight (lb): 150 BMI: 25.84  Nuclear Med Study 1 or 2 day study:  1 day     Stress Test Type:  lexiscan Low Level Reading MD:  Arvilla Meres, MD     Referring MD:  Delfin Edis Resting Radionuclide:  Technetium 34m Tetrofosmin     Resting Radionuclide Dose:  10.6 mCi  Stress Radionuclide:  Technetium 7m Tetrofosmin     Stress Radionuclide Dose:  33 mCi   Stress Protocol      Max HR:  108 bpm     Predicted Max HR:  141 bpm  Max Systolic BP: 162 mm Hg     Percent Max HR:  76.60 %Rate Pressure Product:  [address]109  Lexiscan: 0.4 mg   Stress Test Technologist:  Irean Hong RN     Nuclear Technologist:  Domenic Polite CNMT  Rest Procedure  Myocardial perfusion imaging was performed at rest 45 minutes following the intravenous administration of Myoview Technetium 74m Tetrofosmin.  Stress Procedure  The patient received IV Lexiscan 0.4 mg over 15-seconds with concurrent low level exercise and then myoview was injected at 30-seconds while the patient continued walking one more minute.  There were no significant changes with lexiscan (baseline T wave  changes).  Quantitative spect images were obtained after a 45 minute delay.  QPS Raw Data Images:  Normal; no motion artifact; normal heart/lung ratio. Stress Images:  There is normal uptake in all areas. Rest Images:  Decreased uptake in the inferior wall and apex. Subtraction (SDS):  No evidence of ischemia. Transient Ischemic Dilatation:  1.22  (Normal <1.22)  Lung/Heart Ratio:  .25  (Normal <0.45)  Quantitative Gated Spect Images QGS EDV:  57 ml QGS ESV:  14 ml QGS EF:  76 % QGS cine images:  Normal  Findings Normal nuclear study      Overall Impression  Exercise Capacity: Lexiscan study with no exercise. Clinical Symptoms: Nausea, headache. ECG Impression: Baseline: NSR; No significant ST segment change with Lexiscan. Overall Impression: Normal stress nuclear study.

## 2010-03-03 NOTE — Assessment & Plan Note (Signed)
Summary: dyspnea, hypoxemia   Visit Type:  Follow-up Copy to:  Dr. Deborah Chalk Primary Provider/Referring Provider:  Dr. Joyce Gross  CC:  Dyspnea follow-up...the patient says her breathing and memory are better since using oxygen...no complaints today.  History of Present Illness: 77 yo woman, never smoker, CAD/CABG, HTN, DM, CVA, OA. Followed by Dr Deborah Chalk for her CAD, HTN.    Referred for evaluation of recent worsening in dyspnea. Has been evaluated with Lexiscan, showed mild progression of her CAD (no intervention recommended), also PFT's restriction vs mixed disease. In retrospect she reports slow progressive SOB, worse with exertion. Also talking. Sleeps sitting up because she feels a pressure in her chest when supine. Gets waked up at night. She snores some. Having hoarse voice, dyspnea with talking. Has nasal gtt, recent rx for sinusitis.   ROV 10/30/09 -- returns for dyspnea with restriction/mixed disease. CXR without infiltrates last visit. MIP and MEP performed, both decreased (40 - 46% pred). Her breathing is unchanged since last visit, doesn't believe she has the energy to do the work she wants to do. This seems to be related to weakness as opposed to overt dyspnea. FEV1 65% predicted.   ROV 12/11/09 -- follows up today for her dyspnea. PFT with mixed disease. We started O2 last time and she feels better. We also did trial of SABA to see if it helped - no real difference with the inhaler.   Current Medications (verified): 1)  Fortamet 1000 Mg Xr24h-Tab (Metformin Hcl) .... Once Daily After Dinner 2)  Januvia 100 Mg Tabs (Sitagliptin Phosphate) .... Once Daily in Am 3)  Plavix 75 Mg Tabs (Clopidogrel Bisulfate) .... Once Daily 4)  Aciphex 20 Mg Tbec (Rabeprazole Sodium) .... Once Daily 5)  Lexapro 20 Mg Tabs (Escitalopram Oxalate) .... Once Daily 6)  Ranexa 1000 Mg Xr12h-Tab (Ranolazine) .Marland Kitchen.. 1 Two Times A Day 7)  Lipitor 20 Mg Tabs (Atorvastatin Calcium) .... Once Daily 8)  Zetia 10  Mg Tabs (Ezetimibe) .... Once Daily 9)  Antara 130 Mg Caps (Fenofibrate Micronized) .... Once Daily 10)  Lantus 100 Unit/ml Soln (Insulin Glargine) .... 20 Units in The Am 11)  Bayer Aspirin 325 Mg Tabs (Aspirin) .Marland Kitchen.. 1 Once Daily 12)  Mirtazapine 15 Mg Tabs (Mirtazapine) .... Once Daily 13)  Vitamin D (Ergocalciferol) 50000 Unit Caps (Ergocalciferol) .... Once A Week 14)  Nitrostat 0.4 Mg Subl (Nitroglycerin) .... As Needed 15)  Isosorbide Mononitrate Cr 60 Mg Xr24h-Tab (Isosorbide Mononitrate) .Marland Kitchen.. 1 1/2 Two Times A Day 16)  Multivitamins  Tabs (Multiple Vitamin) .Marland Kitchen.. 1 Once Daily 17)  Flonase 50 Mcg/act Susp (Fluticasone Propionate) .... 2 Sprays Each Nostril Once Daily 18)  Ventolin Hfa 108 (90 Base) Mcg/act Aers (Albuterol Sulfate) .... 2 Puffs Q4h As Needed Sob 19)  Lunesta 1 Mg Tabs (Eszopiclone) .Marland Kitchen.. 1 By Mouth At Bedtime 20)  Prednisolone Sodium Phosphate 1 % Soln (Prednisolone Sodium Phosphate) .Marland Kitchen.. 1 Drop in Each Eye Two Times A Day 21)  Stool Softener 100 Mg Caps (Docusate Sodium) .... As Needed  Allergies (verified): 1)  ! Codeine 2)  ! * Actos 3)  ! Morphine  Vital Signs:  Patient profile:   77 year old female Height:      64 inches (162.56 cm) Weight:      157.50 pounds (71.59 kg) BMI:     27.13 O2 Sat:      97 % on 2 L/min pulsing Temp:     98.0 degrees F (36.67 degrees C) oral Pulse rate:  72 / minute BP sitting:   126 / 80  (left arm) Cuff size:   regular  Vitals Entered By: Michel Bickers CMA (December 11, 2009 2:13 PM)  O2 Sat at Rest %:  97 O2 Flow:  2 L/min pulsing CC: Dyspnea follow-up...the patient says her breathing and memory are better since using oxygen...no complaints today Comments Medications reviewed with patient Michel Bickers CMA  December 11, 2009 2:14 PM   Physical Exam  General:  pleasant overwt elderly woman, hoarse voice Head:  normocephalic and atraumatic Eyes:  conjunctiva and sclera clear Nose:  no deformity, discharge, inflammation,  or lesions Mouth:  no deformity or lesions Neck:  no masses, thyromegaly, or abnormal cervical nodes, no stridor Lungs:  soft B basilar insp crackles Heart:  regular rate and rhythm, S1, S2 without murmurs, rubs, gallops, or clicks Abdomen:  not examined Msk:  no deformity or scoliosis noted with normal posture Extremities:  trace to 1+ LE edema Neurologic:  non-focal Skin:  intact without lesions or rashes Psych:  alert and cooperative; normal mood and affect; normal attention span and concentration   Impression & Recommendations:  Problem # 1:  HYPOXEMIA (ICD-799.02)  With mixed disease, did not seem to benefit from SABA but definitely notices diference w O2 - continue O2 and get ONO - stop SABA - ROV 6 mo  Orders: Est. Patient Level IV (04540)  Medications Added to Medication List This Visit: 1)  Lunesta 1 Mg Tabs (Eszopiclone) .Marland Kitchen.. 1 by mouth at bedtime 2)  Prednisolone Sodium Phosphate 1 % Soln (Prednisolone sodium phosphate) .Marland Kitchen.. 1 drop in each eye two times a day 3)  Stool Softener 100 Mg Caps (Docusate sodium) .... As needed  Patient Instructions: 1)  Continue to wear your oxygen at all times.  2)  Stop using the albuterol inhaler.  3)  Follow up with Dr Delton Coombes in 6 months or as needed.    Immunization History:  Influenza Immunization History:    Influenza:  historical (10/12/2009)

## 2010-03-03 NOTE — Letter (Signed)
Summary: CMN/[facility]  CMN/[facility]   Imported By: Lester Bristol 12/10/2009 08:33:08  _____________________________________________________________________  External Attachment:    Type:   Image     Comment:   External Document

## 2010-03-03 NOTE — Progress Notes (Signed)
Summary: status ONO/ forms//FYI Lawson Fiscal  Phone Note From Other Clinic   Caller: harriett w/ [facility] Call For: byrum Summary of Call: checking on status of patients ONO forms faxed recently. call harriett at [phone] x [phone] Initial call taken by: Tivis Ringer, CNA,  January 02, 2010 10:41 AM  Follow-up for Phone Call        I looked for the forms in [place] office adn in [facility] look-at but could not locate them. I will forward to Alida to investigate. Carron Curie CMA  January 02, 2010 12:04 PM   Additional Follow-up for Phone Call Additional follow up Details #1::        Spoke with Lawson Fiscal per 12/17/09 OV note, she has in Dr Delton Coombes look at; he has not looked at yet, will notify [facility] @ [facility] .Kandice Hams CMA  January 05, 2010 8:50 AM

## 2010-03-03 NOTE — Progress Notes (Signed)
Summary:  over nite oximetry  Phone Note From Other Clinic Call back at [phone]   Caller: Patient Call For: byrum Caller: [facility] Call For: byrum Summary of Call: have questions about over nite oximetry. Initial call taken by: Rickard Patience,  November 24, 2009 10:09 AM  Follow-up for Phone Call        [facility]C [facility], was on hold for 4 minuites. WCB. Carron Curie CMA  November 24, 2009 12:07 PM   Additional Follow-up for Phone Call Additional follow up Details #1::        Spoke with Jonette Eva and advised that they need to do ONO for our records and not just for ins purposes. Additional Follow-up by: Vernie Murders,  November 24, 2009 1:03 PM

## 2010-03-04 DIAGNOSIS — I5030 Unspecified diastolic (congestive) heart failure: Secondary | ICD-10-CM

## 2010-03-04 HISTORY — DX: Unspecified diastolic (congestive) heart failure: I50.30

## 2010-03-05 NOTE — Procedures (Signed)
Summary: [employer]  [employer]   Imported By: Lester Chamisal 01/16/2010 12:16:29  _____________________________________________________________________  External Attachment:    Type:   Image     Comment:   External Document

## 2010-03-05 NOTE — Letter (Signed)
Summary: Veverly Fells Altheimer MD  Veverly Fells Altheimer MD   Imported By: Lester Noxon 01/16/2010 12:53:15  _____________________________________________________________________  External Attachment:    Type:   Image     Comment:   External Document

## 2010-03-05 NOTE — Progress Notes (Signed)
Summary: waiting on CMN//FYI   [facility] WILL REFAX TO YOUR ATTN  Phone Note From Other Clinic   Caller: HARRIETT W/ [facility] Call For: BYRUM Summary of Call: still waiting on CMN re: ONO. from last month. call [phone]. Tivis Ringer, CNA  January 29, 2010 2:05 PM Initial call taken by: Tivis Ringer, CNA,  January 29, 2010 2:06 PM  Follow-up for Phone Call        [facility] Yetta Barre RN  January 29, 2010 3:53 PM  Lawson Fiscal, per phone note from 01/02/10, this was in RB's look at.  Do you know if he has addressed this yet?  Follow-up by: Gweneth Dimitri RN,  January 29, 2010 3:54 PM  Additional Follow-up for Phone Call Additional follow up Details #1::        All CMN's are sent through Alida. I do not have any CMN's in RB's look at. Alida,  can you help locate or find out what they are needing? Thanks!Michel Bickers CMA  January 29, 2010 4:25 PM  Harriett w/ [facility] returned call from [facility]. [phone] x [phone]. Tivis Ringer, CNA  January 30, 2010 12:06 PM    Additional Follow-up for Phone Call Additional follow up Details #2::    I do not have, this was addressed on 01/05/10 (phone note  12/17/09 and 01/02/10)) Patient ONO was in RB look at  at that time. Per Lawson Fiscal they may have to refax because she has  not seen it.,  Will notify Harriet @ [facility]  ([facility])to refax to ATTN: LORI  TO TRIAGE FAX Follow-up by: Kandice Hams CMA,  January 30, 2010 8:48 AM  Additional Follow-up for Phone Call Additional follow up Details #3:: Details for Additional Follow-up Action Taken: Berton Mount at [facility] stated that matter is resolved and that she has faxed CMN to [place].   Spoke with [facility], Efraim Kaufmann (Berton Mount out of office until 10) in ref to this, they need a revised cmn signed  they are the company ordering the liquid 02 previous order  10/30/09 was under DRS ,  received a call from [facility]  to [facility] for liquid 02.  CMN redone  under [facility] to Dr byrum for  signaature.  Informed Efraim Kaufmann will fax as soon as we get signature .Kandice Hams [facility]  February 10, 2010 9:41 AM  Additional Follow-up by: Leonette Monarch,  January 30, 2010 10:17 AM  I spoke with Harriett @ [facility], CMN forms received but I do not have titration on O2 that was requested by Dr. Delton Coombes on 11/10/2009 and will need this to answer questions on CMN. Harriett is checking into this and will call me back.Michel Bickers [facility]  January 30, 2010 11:39 AM   Blinda Leatherwood, were you able to find out anything regarding the the titration study on oxygen for this patient? .They have never returned my phone calls.Michel Bickers CMA  February 09, 2010 5:03 PM    CMN filled out and to  Dr Delton Coombes folder for signature  see above note .Kandice Hams CMA  February 10, 2010 9:51 AM

## 2010-03-05 NOTE — Letter (Signed)
Summary: CMN for Oxygen/[facility]  CMN for Oxygen/[facility]   Imported By: Sherian Rein 02/17/2010 10:25:18  _____________________________________________________________________  External Attachment:    Type:   Image     Comment:   External Document

## 2010-03-05 NOTE — Letter (Signed)
Summary: CMN/[facility]  CMN/[facility]   Imported By: Lester Exeter 01/16/2010 12:18:17  _____________________________________________________________________  External Attachment:    Type:   Image     Comment:   External Document

## 2010-03-11 NOTE — Assessment & Plan Note (Signed)
Summary: NEW PT/SAB   Visit Type:  Follow-up Referring Provider:  Dr. Deborah Chalk Primary Provider:  Dr. Joyce Gross  CC:  c/o SOB. denies chest pain and palpitations..  History of Present Illness: Christine Blanchard is a very pleasant 77 year old woman with history of coronary artery disease, bypass surgery in 2004, hyperlipidemia, PCI 6 months after her bypass, repeat PCI one year later, diabetes, hypertension, spinal stenosis who currently lives in a nursing home in [place], who has chronic unsteady gait per her daughter with recent worsening of her shortness of breath over the past year and now on chronic oxygen who presents to establish care. Previously she was seen by Dr. Laverda Sorenson in [place].  She reports that she  was started 6 months ago  on oxygenand she has gone from oxygen with exertion to now wearing oxygen 24 hours a day. Notes from pulmonary indicates she has mixed lung disease. She has no smoking history. She denies having any recent echocardiogram though stress test from August 2011 shows no ischemia with normal LV function. she has not had any further chest pain. Her usual anginal symptoms include shortness of breath and back pain radiating around to her front. She has not had any of the back pain. She uses a walker and is able to make it  300 feet to her lunchroom.  she does report an episode last night where she got up to go to her bedroom and her legs were very weak and gave out underneath her. She tried to get up though her arms then seemed very weak and shaky. She had to crawl across the floor to her bedroom. Symptoms later resolved. She does report leg weakness secondary to her spinal stenosis though she is uncertain if this was the cause of her gait unsteadiness.  EKG shows normal sinus rhythm with rate 73 beats per minute, no significant ST-T wave changes, left axis deviation  Current Medications (verified): 1)  Fortamet 1000 Mg Xr24h-Tab (Metformin Hcl) .... Once Daily After  Dinner 2)  Januvia 100 Mg Tabs (Sitagliptin Phosphate) .... Once Daily in Am 3)  Plavix 75 Mg Tabs (Clopidogrel Bisulfate) .... Once Daily 4)  Aciphex 20 Mg Tbec (Rabeprazole Sodium) .... Once Daily 5)  Lexapro 20 Mg Tabs (Escitalopram Oxalate) .... Once Daily 6)  Ranexa 1000 Mg Xr12h-Tab (Ranolazine) .Marland Kitchen.. 1 Two Times A Day 7)  Lipitor 20 Mg Tabs (Atorvastatin Calcium) .... Once Daily 8)  Zetia 10 Mg Tabs (Ezetimibe) .... Once Daily 9)  Antara 130 Mg Caps (Fenofibrate Micronized) .... Once Daily 10)  Lantus 100 Unit/ml Soln (Insulin Glargine) .... 20 Units in The Am 11)  Bayer Aspirin 325 Mg Tabs (Aspirin) .Marland Kitchen.. 1 Once Daily 12)  Mirtazapine 15 Mg Tabs (Mirtazapine) .... Once Daily 13)  Vitamin D (Ergocalciferol) 50000 Unit Caps (Ergocalciferol) .... Once A Week 14)  Nitrostat 0.4 Mg Subl (Nitroglycerin) .... As Needed 15)  Isosorbide Mononitrate Cr 60 Mg Xr24h-Tab (Isosorbide Mononitrate) .Marland Kitchen.. 1 1/2 Two Times A Day 16)  Multivitamins  Tabs (Multiple Vitamin) .Marland Kitchen.. 1 Once Daily 17)  Flonase 50 Mcg/act Susp (Fluticasone Propionate) .... 2 Sprays Each Nostril Once Daily 18)  Lunesta 1 Mg Tabs (Eszopiclone) .Marland Kitchen.. 1 By Mouth At Bedtime 19)  Prednisolone Sodium Phosphate 1 % Soln (Prednisolone Sodium Phosphate) .Marland Kitchen.. 1 Drop in Each Eye Two Times A Day 20)  Stool Softener 100 Mg Caps (Docusate Sodium) .... As Needed  Allergies (verified): 1)  ! Codeine 2)  ! * Actos 3)  ! Morphine  Past  History:  Past Medical History: Last updated: 10/03/2009 Hypertension Diabetes, Type 2 Allergy problems sinus problems high cholesterol  Past Surgical History: Last updated: 10/03/2009 Cholecystectomy discotomy cervical inffusion Heart Bypass Stents--2 3 cornial transplants  Family History: Last updated: 10/03/2009 Heart disease--father Colon cancer--mother bladder cancer--brother  Social History: Last updated: 10/03/2009 Widowed Patient never smoked.  children 2 Native of [place], has worked  in setting exposed to 2nd hand smoke  Risk Factors: Smoking Status: never (10/03/2009)  Review of Systems       The patient complains of dyspnea on exertion.  The patient denies fever, weight loss, weight gain, vision loss, decreased hearing, hoarseness, chest pain, syncope, peripheral edema, prolonged cough, abdominal pain, incontinence, muscle weakness, depression, and enlarged lymph nodes.    Vital Signs:  Patient profile:   77 year old female Height:      64 inches Weight:      155.50 pounds BMI:     26.79 Pulse rate:   73 / minute BP sitting:   146 / 68  (left arm) Cuff size:   regular  Vitals Entered By: Lysbeth Galas CMA (March 02, 2010 2:25 PM)  Physical Exam  General:  Well developed, well nourished, in no acute distress. Head:  normocephalic and atraumatic Neck:  Neck supple, no JVD. No masses, thyromegaly or abnormal cervical nodes. Lungs:  Clear bilaterally to auscultation and percussion. Heart:  Non-displaced PMI, chest non-tender; regular rate and rhythm, S1, S2 with I/VI SEM RSB,  no rubs or gallops. Carotid upstroke normal, no bruit. Pedals normal pulses. No edema, no varicosities. Abdomen:  Bowel sounds positive; abdomen soft and non-tender without masses Msk:  Back normal, normal gait. Muscle strength and tone normal. Pulses:  pulses normal in all 4 extremities Extremities:  No clubbing or cyanosis. Neurologic:  Alert and oriented x 3. Skin:  Intact without lesions or rashes. Psych:  Normal affect.   Impression & Recommendations:  Problem # 1:  HYPOXEMIA (ICD-799.02) etiology of her hypoxia is uncertain. Pulmonary clinical exam is essentially benign. We have ordered an echocardiogram to evaluate cardiac function, valvular disease, right side pressures to exclude pulmonary hypertension.  Problem # 2:  CAD (ICD-414.00) Notes indicate a normal stress test in August 2011. No further testing will be performed at this time. Her shortness of breath is stable. If  this gets worse, we could perform a cardiac catheterization.  Her updated medication list for this problem includes:    Plavix 75 Mg Tabs (Clopidogrel bisulfate) ..... Once daily    Ranexa 1000 Mg Xr12h-tab (Ranolazine) .Marland Kitchen... 1 two times a day    Bayer Aspirin 325 Mg Tabs (Aspirin) .Marland Kitchen... 1 once daily    Nitrostat 0.4 Mg Subl (Nitroglycerin) .Marland Kitchen... As needed    Isosorbide Mononitrate Cr 60 Mg Xr24h-tab (Isosorbide mononitrate) .Marland Kitchen... 1 1/2 two times a day  Orders: EKG w/ Interpretation (93000) Echocardiogram (Echo)  Problem # 3:  HYPERTENSION (ICD-401.9) Blood pressure is relatively well controlled on her current medications.  Her updated medication list for this problem includes:    Bayer Aspirin 325 Mg Tabs (Aspirin) .Marland Kitchen... 1 once daily  Problem # 4:  HYPERLIPIDEMIA-MIXED (ICD-272.4) We will try to obtain her most recent lipid panels for our records.  Her updated medication list for this problem includes:    Lipitor 20 Mg Tabs (Atorvastatin calcium) ..... Once daily    Zetia 10 Mg Tabs (Ezetimibe) ..... Once daily    Antara 130 Mg Caps (Fenofibrate micronized) ..... Once daily  Patient Instructions:  1)  Your physician recommends that you schedule a follow-up appointment in: 6 months 2)  Your physician recommends that you continue on your current medications as directed. Please refer to the Current Medication list given to you today. 3)  Your physician has requested that you have an echocardiogram.  Echocardiography is a painless test that uses sound waves to create images of your heart. It provides your doctor with information about the size and shape of your heart and how well your heart's chambers and valves are working.  This procedure takes approximately one hour. There are no restrictions for this procedure.

## 2010-03-11 NOTE — Progress Notes (Signed)
Summary: O2-order sent  Phone Note Call from Patient Call back at Home Phone [phone]   Caller: Patient Call For: byrum Summary of Call: pt wants to go to a "pajama party"  on 2[place]18- to the beach with her lady friends. she has questions re: O2.  Initial call taken by: Tivis Ringer, CNA,  March 03, 2010 10:44 AM  Follow-up for Phone Call        Spoke with pt.  She is requesting a new portable o2 system.  She already has "the one that fits in her pocket book", but is concerned about running out of o2 too fast. Wants to know what RB would reccomend. Pls advise thanks Follow-up by: Vernie Murders,  March 03, 2010 12:52 PM  Additional Follow-up for Phone Call Additional follow up Details #1::        We can ask DME company to speak w her and try to meet whatever needs she has to travel there - may want portable concentrator? large tanks? I will send order for them to eval Leslye Peer MD  March 04, 2010 2:52 PM  Additional Follow-up by: Leslye Peer MD,  March 04, 2010 2:52 PM    Additional Follow-up for Phone Call Additional follow up Details #2::    ATC x 2- line was busy, WCB Vernie Murders  March 04, 2010 2:57 PM  pt aware order placed.Carron Curie CMA  March 04, 2010 3:07 PM

## 2010-03-11 NOTE — Letter (Signed)
Summary: Medical Record Release  Medical Record Release   Imported By: Harlon Flor 03/03/2010 12:47:37  _____________________________________________________________________  External Attachment:    Type:   Image     Comment:   External Document

## 2010-03-16 ENCOUNTER — Telehealth: Payer: Self-pay | Admitting: Cardiovascular Disease

## 2010-03-18 ENCOUNTER — Other Ambulatory Visit: Payer: Self-pay | Admitting: Dermatology

## 2010-03-25 NOTE — Progress Notes (Signed)
Summary: RX  Phone Note Refill Request Call back at Home Phone [phone] Message from:  Patient on March 16, 2010 9:27 AM  Refills Requested: Medication #1:  RANEXA 1000 MG XR12H-TAB 1 two times a day   Notes: [facility] [address] Initial call taken by: Harlon Flor,  March 16, 2010 9:28 AM    Prescriptions: RANEXA 1000 MG XR12H-TAB (RANOLAZINE) 1 two times a day  #60 x 3   Entered by:   Lysbeth Galas CMA   Authorized by:   Dossie Arbour MD   Signed by:   Lysbeth Galas CMA on 03/16/2010   Method used:   [facility]ronically to        [facility]  [address] [id number]       [address]       [place], [place]  [address]       Ph: [phone]       Fax: [phone]   RxID:   [id number]

## 2010-03-31 ENCOUNTER — Other Ambulatory Visit: Payer: Self-pay | Admitting: Cardiovascular Disease

## 2010-03-31 ENCOUNTER — Other Ambulatory Visit: Payer: Self-pay

## 2010-03-31 ENCOUNTER — Other Ambulatory Visit (INDEPENDENT_AMBULATORY_CARE_PROVIDER_SITE_OTHER): Payer: Medicare Other

## 2010-03-31 DIAGNOSIS — R011 Cardiac murmur, unspecified: Secondary | ICD-10-CM

## 2010-03-31 NOTE — Letter (Signed)
Summary: Patient Receipt of NPP  Patient Receipt of NPP   Imported By: Marylou Mccoy 03/26/2010 14:34:04  _____________________________________________________________________  External Attachment:    Type:   Image     Comment:   External Document

## 2010-04-02 ENCOUNTER — Telehealth: Payer: Self-pay | Admitting: Cardiovascular Disease

## 2010-04-07 ENCOUNTER — Encounter: Payer: Self-pay | Admitting: Internal Medicine

## 2010-04-09 NOTE — Progress Notes (Signed)
Summary: ECHO results  Phone Note Call from Patient Call back at Home Phone [phone]   Caller: SELF Call For: [facility] Summary of Call: Pt is calling for ECHO results. Initial call taken by: Harlon Flor,  April 02, 2010 9:56 AM

## 2010-04-09 NOTE — Letter (Signed)
Summary: [facility] Progress Note   [facility] Progress Note   Imported By: Roderic Ovens 03/30/2010 16:23:34  _____________________________________________________________________  External Attachment:    Type:   Image     Comment:   External Document

## 2010-04-17 ENCOUNTER — Ambulatory Visit: Payer: Medicare Other | Admitting: Cardiovascular Disease

## 2010-04-21 ENCOUNTER — Ambulatory Visit (INDEPENDENT_AMBULATORY_CARE_PROVIDER_SITE_OTHER): Payer: Medicare Other | Admitting: Cardiovascular Disease

## 2010-04-21 ENCOUNTER — Encounter: Payer: Self-pay | Admitting: Cardiovascular Disease

## 2010-04-21 VITALS — BP 106/68 | HR 86 | Ht 64.0 in | Wt 150.1 lb

## 2010-04-21 DIAGNOSIS — E785 Hyperlipidemia, unspecified: Secondary | ICD-10-CM

## 2010-04-21 DIAGNOSIS — I2510 Atherosclerotic heart disease of native coronary artery without angina pectoris: Secondary | ICD-10-CM

## 2010-04-21 DIAGNOSIS — I10 Essential (primary) hypertension: Secondary | ICD-10-CM

## 2010-04-21 DIAGNOSIS — R0602 Shortness of breath: Secondary | ICD-10-CM

## 2010-04-21 NOTE — Assessment & Plan Note (Signed)
Notes indicate a normal stress test in August 2011. No further testing will be performed at this time. Her shortness of breath is stable. If this gets worse, we could perform a cardiac catheterization.

## 2010-04-21 NOTE — Assessment & Plan Note (Signed)
Etiology of her shortness of breath is still uncertain. No pulmonary hypertension, normal systolic function. Likely noncardiac. We'll defer back to pulmonary.

## 2010-04-21 NOTE — Progress Notes (Signed)
   Patient ID: Christine Blanchard, female    DOB: 24-Jun-1928, 77 y.o.   MRN: [id number]  HPI Christine Blanchard is a very pleasant 77 year old woman with history of coronary artery disease, bypass surgery in 2004, hyperlipidemia, PCI 6 months after her bypass, repeat PCI one year later, diabetes, hypertension, spinal stenosis who currently lives in a nursing home in [place], who has chronic unsteady gait per her daughter with recent worsening of her shortness of breath over the past year and now on chronic oxygen who presents 77 routine followup.  She continues to require increasing amounts of oxygen. She is very short of breath without her nasal cannula oxygen with exertion. She denies any chest pain or lower extremity edema. She denies any significant cough. She believes that one of her medications is causing her shortness of breath. She does have regular followup with [facility]. Her usual angina includes back pain radiating around to her front. No recent back pain.  Echocardiogram performed February 2012 was essentially normal with mild TR, normal right ventricular systolic pressure  Stress test in August 2011 was normal with no ischemia, normal LV function.  EKG shows normal sinus rhythm with rate 73 beats per minute, no significant ST-T wave changes, left axis deviation   Review of Systems  Constitutional: Positive for activity change, appetite change and fatigue. Negative for fever and unexpected weight change.  HENT: Negative.   Eyes: Negative.   Respiratory: Positive for shortness of breath. Negative for apnea, cough, choking, chest tightness, wheezing and stridor.   Cardiovascular: Negative.   Gastrointestinal: Negative.   Musculoskeletal: Negative.   Skin: Negative.   Neurological: Negative.   Hematological: Negative.   Psychiatric/Behavioral: Negative.       Physical Exam  Nursing note and vitals reviewed. Constitutional: She is oriented to person, place, and time. She  appears well-developed and well-nourished.  HENT:  Head: Normocephalic.  Nose: Nose normal.  Mouth/Throat: Oropharynx is clear and moist.  Eyes: Conjunctivae are normal. Pupils are equal, round, and reactive to light.  Neck: Normal range of motion. Neck supple. No JVD present.  Cardiovascular: Normal rate, regular rhythm, normal heart sounds and intact distal pulses.  Exam reveals no gallop and no friction rub.   No murmur heard. Pulmonary/Chest: Effort normal. No respiratory distress. She has no wheezes. She has no rales. She exhibits no tenderness.       Rales at the bases R>L  Abdominal: Soft. Bowel sounds are normal. She exhibits no distension. There is no tenderness.  Musculoskeletal: Normal range of motion. She exhibits no edema and no tenderness.  Lymphadenopathy:    She has no cervical adenopathy.  Neurological: She is alert and oriented to person, place, and time. Coordination normal.  Skin: Skin is warm and dry. No rash noted. No erythema.  Psychiatric: She has a normal mood and affect. Her behavior is normal. Judgment and thought content normal.     Assessment and Plan

## 2010-04-21 NOTE — Assessment & Plan Note (Signed)
Continue current medication regimen. No changes made.

## 2010-04-21 NOTE — Assessment & Plan Note (Signed)
Blood pressure is well controlled on today's visit. No changes made to the medications. 

## 2010-04-21 NOTE — Patient Instructions (Addendum)
You will follow up with Dr. Mariah Milling in 6 months. We will call you with an appt at a later time.  Continue your current medications.

## 2010-04-24 ENCOUNTER — Encounter: Payer: Self-pay | Admitting: Internal Medicine

## 2010-04-24 ENCOUNTER — Ambulatory Visit (INDEPENDENT_AMBULATORY_CARE_PROVIDER_SITE_OTHER): Payer: Medicare Other | Admitting: Internal Medicine

## 2010-04-24 DIAGNOSIS — R0602 Shortness of breath: Secondary | ICD-10-CM

## 2010-04-24 MED ORDER — DOXYCYCLINE HYCLATE 100 MG PO CAPS: 100.0000 mg | ORAL_CAPSULE | Freq: Two times a day (BID) | ORAL | Status: AC

## 2010-04-24 MED ORDER — MOMETASONE FURO-FORMOTEROL FUM 100-5 MCG/ACT IN AERO: INHALATION_SPRAY | RESPIRATORY_TRACT | Status: DC

## 2010-04-24 NOTE — Patient Instructions (Signed)
Dulera 100 Take 2 puffs first thing in am and then another 2 puffs about 12 hours later.     Work on inhaler technique:  relax and gently blow all the way out then take a nice smooth deep breath back in, triggering the inhaler at same time you start breathing in.  Hold for up to 5 seconds if you can.  Rinse and gargle with water when done  Doxycycline 100 mg one twice daily before eat with glass of water x 7 days.     For cough, mucinex dm 2 every 12 hours  Return in one week to see Tammy our NP to make sure you're better and use dulera more effectively and if not she can try you on a different way of giving you this medication.

## 2010-04-24 NOTE — Progress Notes (Signed)
  Subjective:    Patient ID: Christine Blanchard, female    DOB: Apr 13, 1928, 77 y.o.   MRN: [id number]  HPI  Primary Provider/Referring Provider: Dr. Joyce Gross   CC: Dyspnea follow-up...the patient says her breathing and memory are better since using oxygen...no complaints today.   History of Present Illness:  77 yo woman, never smoker, CAD/CABG, HTN, DM, CVA, OA. Followed by Dr Deborah Chalk for her CAD, HTN.  Referred for evaluation of recent worsening in dyspnea. Has been evaluated with Lexiscan, showed mild progression of her CAD (no intervention recommended), also PFT's restriction vs mixed disease. In retrospect she reports slow progressive SOB, worse with exertion. Also talking. Sleeps sitting up because she feels a pressure in her chest when supine. Gets waked up at night. She snores some. Having hoarse voice, dyspnea with talking. Has nasal gtt, recent rx for sinusitis.  ROV 10/30/09 -- returns for dyspnea with restriction/mixed disease. CXR without infiltrates last visit. MIP and MEP performed, both decreased (40 - 46% pred). Her breathing is unchanged since last visit, doesn't believe she has the energy to do the work she wants to do. This seems to be related to weakness as opposed to overt dyspnea. FEV1 65% predicted.  ROV 12/11/09 -- follows up today for her dyspnea. PFT with mixed disease. We started O2 last time and she feels better. We also did trial of SABA to see if it helped - no real difference with the inhaler.  04/24/2010 ov cc acutely worse cough x 1 week productive minimal cream mucus and doe but not at rest, cough worse day than night.  .  Pt denies any significant sore throat, dysphagia, itching, sneezing,  nasal congestion or excess/ purulent secretions,  fever, chills, sweats, unintended wt loss, pleuritic or exertional cp, hempoptysis, orthopnea pnd or leg swelling.    Also denies any obvious fluctuation of symptoms with weather or environmental changes or other aggravating or  alleviating factors.         Review of Systems     Objective:   Physical Exam    Physical Exam  General: pleasant overwt elderly woman, hoarse voice  Head: normocephalic and atraumatic  Eyes: conjunctiva and sclera clear  Nose: no deformity, discharge, inflammation, or lesions  Mouth: no deformity or lesions  Neck: no masses, thyromegaly, or abnormal cervical nodes, no stridor  Lungs: soft B basilar insp crackles  Heart: regular rate and rhythm, S1, S2 without murmurs, rubs, gallops, or clicks  Abdomen: not examined  Msk: no deformity or scoliosis noted with normal posture  Extremities: trace to 1+ LE edema  Neurologic: non-focal  Skin: intact without lesions or rashes  Psych: alert and cooperative; normal mood and affect; normal attention span and concentratio    Assessment & Plan:

## 2010-04-25 ENCOUNTER — Encounter: Payer: Self-pay | Admitting: Internal Medicine

## 2010-04-25 NOTE — Assessment & Plan Note (Addendum)
Acute worsening in setting of discolored mucus is c/w asthmatic bronchitis component or certainly some airways issue that has been chronically difficult to manage.  The differential diagnosis of difficult to control airways disorders is extensive with no quick and easy answers but easy to remember because it consists of 12 A's,  Two Bs and one C: 1. Adherence, always a challenge and the leading suspect  > she is on a very complex regimen 2. Acid reflux disease, with the greater proportion of pulmonary patients with no overt heartburn symptoms, and no easy way to treat non-acid reflux 3. Ace inhibitor use not an issue here 4. Active sinus dz, best addressed by a sinus ct 5. Active smoking,  Usually sureptitious in this setting 6. Allergic diseases, usually with a hx dating back to childhood and more of a challenge in younger patients 7. Aspiration, a perennial problem in the elderly or other patients at risk 8. Allergic Bronchopulmonary Aspergillosis, associated with IgE's in the thousands 9. Alpha one Antitrypsin deficiency, a must screen in patients with chronic airflow obstruction syndromes out of proportion to smoking history. 10. Adverse effect of inhalers, especially DPI's and especially with poor inhaler technique 11 Anxiety, always a diagnosis of exclusion 12 Acquaintance of or Actual healthcare provider ( which can be very challenging) Two B's Bronchiectasis and Beta blocker effects One C Congestive heart failure > cards says no   In this case Adherence is the biggest issue and starts with  inability to use HFA effectively and also  understand that SABA treats the symptoms but doesn't get to the underlying problem (inflammation).  I used  the analogy of putting steroid cream on a rash to help explain the meaning of topical therapy and the need to get the drug to the target tissue.  .    The proper method of use, as well as anticipated side effects, of this metered-dose inhaler are  discussed and demonstrated to the patient. This improved to about 50%  But is not consistent.    If not improved at next ov, consider change to brovana/perfomist and budesonide trial to see if it better addresses both her short and longterm issues

## 2010-04-28 ENCOUNTER — Encounter: Payer: Self-pay | Admitting: Family Medicine

## 2010-04-28 ENCOUNTER — Ambulatory Visit (INDEPENDENT_AMBULATORY_CARE_PROVIDER_SITE_OTHER): Payer: Medicare Other | Admitting: Family Medicine

## 2010-04-28 DIAGNOSIS — I10 Essential (primary) hypertension: Secondary | ICD-10-CM

## 2010-04-28 DIAGNOSIS — E119 Type 2 diabetes mellitus without complications: Secondary | ICD-10-CM

## 2010-04-28 DIAGNOSIS — K5732 Diverticulitis of large intestine without perforation or abscess without bleeding: Secondary | ICD-10-CM

## 2010-04-28 DIAGNOSIS — R0902 Hypoxemia: Secondary | ICD-10-CM

## 2010-04-28 DIAGNOSIS — E785 Hyperlipidemia, unspecified: Secondary | ICD-10-CM

## 2010-04-28 DIAGNOSIS — I2510 Atherosclerotic heart disease of native coronary artery without angina pectoris: Secondary | ICD-10-CM

## 2010-04-28 DIAGNOSIS — K5792 Diverticulitis of intestine, part unspecified, without perforation or abscess without bleeding: Secondary | ICD-10-CM

## 2010-04-28 DIAGNOSIS — R0602 Shortness of breath: Secondary | ICD-10-CM

## 2010-04-28 DIAGNOSIS — I6350 Cerebral infarction due to unspecified occlusion or stenosis of unspecified cerebral artery: Secondary | ICD-10-CM

## 2010-04-28 NOTE — Assessment & Plan Note (Addendum)
Managed by endo, Dr. Leslie Dales. Await records and review.  Anticipate good control, goal would be around 7% a1c given age.Marland Kitchen

## 2010-04-28 NOTE — Assessment & Plan Note (Signed)
Elevated today, but first time meeting new doc.   Check next visit.

## 2010-04-28 NOTE — Assessment & Plan Note (Signed)
Good control per report.  Continue for now.  Await records.

## 2010-04-28 NOTE — Assessment & Plan Note (Addendum)
Continue ASA 325, plavix.  Has been on for years. Also on imdur and ranexa. Not needing nitro. Stress test August 2011 no ischemia, normal LV function.

## 2010-04-28 NOTE — Assessment & Plan Note (Signed)
Continue ASA 325, plavix.  Has been on for years.

## 2010-04-28 NOTE — Assessment & Plan Note (Addendum)
To f/u with pulm tomorrow for recheck.  Unsure etiology of dyspnea currently.  Denies h/o asthma, COPD, smoking.  Will continue to follow with them, appreciate their care. Echocardiogram performed February 2012 mild TR, normal right ventricular systolic pressure

## 2010-04-28 NOTE — Patient Instructions (Addendum)
I will await blood work and records from Dr. Leslie Dales and review. Keep appointment tomorrow with pulmonology. Return in 2-3 months for a physical. Great to meet you today.  Call us with questions.

## 2010-04-28 NOTE — Progress Notes (Signed)
Subjective:    Patient ID: Christine Blanchard, female    DOB: 1929-01-06, 77 y.o.   MRN: [id number]  HPI CC: new patient establish  No concerns today.  Previously saw Dr. Leslie Dales for DM.  Moved to [place] from [place], rec get primary care doc closer to home.  Referred by Dr. Mariah Milling.  Previously saw Dr. Deborah Chalk.    Cough and breathing issues - Placed on doxycycline and tessalon perls last week by pulm for breathing exac (sees Dr. Delton Coombes).  Not placed on steroids.  Started on [place].  Never smoked, no significant 2nd hand exposure.  Unclear where breathing difficulty has come from, f/u with pulm.  Recently noted hypoxia, needing suppl O2.  To return tomorrow for recheck and for further teaching on dulera use.  Uses O2 at 2L [place] daily.  No fevers/chills.  + productive cough but per patient improving compared to last week.  On weekly Vit D for 5 years now.  CAD - Plavix and ASA 325mg  daily since 2007.  S/p stents and CABG 1994  Had blood work done March 9th by Dr. Leslie Dales.  Good report, chol 140.  A1c last was ok per pt.  Awaiting records, should be faxed to me soon.  Preventative: No colonsocopy, not recommended (h/o diverticulitis). utd flu, pna, shingles, pap, mammo per pt.  Medications and allergies reviewed and updated as above. PMHx, SHx, SurgHx reviewed and updated as above.  Review of Systems  Constitutional: Positive for fatigue. Negative for fever, chills, activity change, appetite change and unexpected weight change.  HENT: Negative for hearing loss and neck pain.   Eyes: Negative for redness and visual disturbance.  Respiratory: Positive for cough and shortness of breath. Negative for wheezing.   Cardiovascular: Negative for chest pain, palpitations and leg swelling.  Gastrointestinal: Negative for nausea, vomiting, abdominal pain, diarrhea, constipation, blood in stool and abdominal distention.  Genitourinary: Negative for hematuria and difficulty urinating.  Musculoskeletal:  Positive for gait problem. Negative for myalgias and back pain.  Skin: Negative for rash.  Neurological: Negative for dizziness, seizures, syncope and headaches.  Psychiatric/Behavioral: Negative for dysphoric mood. The patient is not nervous/anxious.   o/w per HPI    Objective:   Physical Exam  Nursing note and vitals reviewed. Constitutional: She is oriented to person, place, and time. She appears well-developed and well-nourished. No distress.       On O2 by   HENT:  Head: Normocephalic and atraumatic.  Right Ear: External ear normal.  Left Ear: External ear normal.  Nose: Nose normal.  Mouth/Throat: Oropharynx is clear and moist.  Eyes: Conjunctivae and EOM are normal. Pupils are equal, round, and reactive to light.  Neck: Normal range of motion. Neck supple. No thyromegaly present.  Cardiovascular: Normal rate, regular rhythm, normal heart sounds and intact distal pulses.   Pulses:      Radial pulses are 2+ on the right side, and 2+ on the left side.  Pulmonary/Chest: Effort normal. Not tachypneic. No respiratory distress. She has no decreased breath sounds. She has no wheezes. She has no rhonchi. She has rales in the right lower field.       Crackles bibasilarly, L>R, minimal rales R.  Abdominal: Soft. Bowel sounds are normal. She exhibits no distension and no mass. There is no tenderness. There is no rebound and no guarding.  Musculoskeletal: Normal range of motion.  Lymphadenopathy:    She has no cervical adenopathy.  Neurological: She is alert and oriented to person, place, and  time.       CN grossly intact, station and gait intact  Skin: Skin is warm and dry.  Psychiatric: She has a normal mood and affect. Her behavior is normal. Judgment and thought content normal.      Assessment & Plan:

## 2010-05-04 ENCOUNTER — Encounter: Payer: Self-pay | Admitting: Emergency Medicine

## 2010-05-06 ENCOUNTER — Ambulatory Visit (INDEPENDENT_AMBULATORY_CARE_PROVIDER_SITE_OTHER): Payer: Medicare Other | Admitting: Adult Health

## 2010-05-06 ENCOUNTER — Encounter: Payer: Self-pay | Admitting: Adult Health

## 2010-05-06 VITALS — BP 116/70 | HR 84 | Temp 96.7°F | Wt 157.0 lb

## 2010-05-06 DIAGNOSIS — J45909 Unspecified asthma, uncomplicated: Secondary | ICD-10-CM

## 2010-05-06 MED ORDER — MOMETASONE FURO-FORMOTEROL FUM 100-5 MCG/ACT IN AERO: INHALATION_SPRAY | RESPIRATORY_TRACT | Status: DC

## 2010-05-06 NOTE — Progress Notes (Signed)
Subjective:    Patient ID: Christine Blanchard, female    DOB: 1928-06-06, 77 y.o.   MRN: [id number]  HPI 77 yo woman, never smoker, CAD/CABG, HTN, DM, CVA, OA. Followed by Dr Deborah Chalk for her CAD, HTN.  Referred for evaluation of recent worsening in dyspnea. Has been evaluated with Lexiscan, showed mild progression of her CAD (no intervention recommended), also PFT's restriction vs mixed disease. In retrospect she reports slow progressive SOB, worse with exertion. Also talking. Sleeps sitting up because she feels a pressure in her chest when supine. Gets waked up at night. She snores some. Having hoarse voice, dyspnea with talking. Has nasal gtt, recent rx for sinusitis.   ROV 10/30/09 -- returns for dyspnea with restriction/mixed disease. CXR without infiltrates last visit. MIP and MEP performed, both decreased (40 - 46% pred). Her breathing is unchanged since last visit, doesn't believe she has the energy to do the work she wants to do. This seems to be related to weakness as opposed to overt dyspnea. FEV1 65% predicted.   ROV 12/11/09 -- follows up today for her dyspnea. PFT with mixed disease. We started O2 last time and she feels better. We also did trial of SABA to see if it helped - no real difference with the inhaler.   04/24/2010 ov cc acutely worse cough x 1 week productive minimal cream mucus and doe but not at rest, cough worse day than night. Tx Doxy x 7 d, and Dulera   05/06/2010 Follow up OV--Returns for 2 week follow up. Last visit she had flare of cough/congestion. Tx for bronchitis with Doxycycline. She was given Elwin Sleight twice daily. She is feeling better with decreased dyspnea.  Still has some cough with white/cream mucus. Decreased cough though. Does not feel as bad. Feels Elwin Sleight is helping.  Energy level is slightly better. Appetitie is a little better.    Review of Systems Constitutional:   No  weight loss, night sweats,  Fevers, chills, fatigue, or  lassitude.  HEENT:   No  headaches,  Difficulty swallowing,  Tooth/dental problems, or  Sore throat,                No sneezing, itching, ear ache, nasal congestion, post nasal drip,   CV:  No chest pain,  Orthopnea, PND, swelling in lower extremities, anasarca, dizziness, palpitations, syncope.   GI  No heartburn, indigestion, abdominal pain, nausea, vomiting, diarrhea, change in bowel habits, loss of appetite, bloody stools.   Resp:    No wheezing.  No chest wall deformity  Skin: no rash or lesions.  GU: no dysuria, change in color of urine, no urgency or frequency.  No flank pain, no hematuria   MS:  No joint pain or swelling.  No decreased range of motion.  No back pain.  Psych:  No change in mood or affect. No depression or anxiety.  No memory loss.   Objective:   Physical Exam GEN: A/Ox3; pleasant , NAD, well nourished   HEENT:  [place]/AT,  EACs-clear, TMs-wnl, NOSE-clear, THROAT-clear, no lesions, no postnasal drip or exudate noted.   NECK:  Supple w/ fair ROM; no JVD; normal carotid impulses w/o bruits; no thyromegaly or nodules palpated; no lymphadenopathy.  RESP  Clear  P & A; w/o, wheezes/ rales/ or rhonchi.no accessory muscle use, no dullness to percussion  CARD:  RRR, no m/r/g  , no peripheral edema, pulses intact, no cyanosis or clubbing.  GI:   Soft & nt; nml bowel sounds; no organomegaly  or masses detected.  Musco: Warm bil, no deformities or joint swelling noted.   Neuro: alert, no focal deficits noted.    Skin: Warm, no lesions or rashes          Assessment & Plan:

## 2010-05-06 NOTE — Assessment & Plan Note (Signed)
Exacerbation -now resolving- she is improved  Plan  Contnue on Dulera 2 puffs Twice daily   May use Mucinex DM Twice daily  As needed

## 2010-05-06 NOTE — Patient Instructions (Signed)
Continue on Dulera 2 puffs Twice daily  -brush/rinse/gargle after use.  Mucinex DM Twice daily  As needed  Cough/congestion  follow up Dr. Delton Coombes in 4-6 weeks and As needed   Please contact office for sooner follow up if symptoms do not improve or worsen or seek emergency care

## 2010-05-13 LAB — GLUCOSE, CAPILLARY
Glucose-Capillary: 101 mg/dL — ABNORMAL HIGH (ref 70–99)
Glucose-Capillary: 106 mg/dL — ABNORMAL HIGH (ref 70–99)
Glucose-Capillary: 111 mg/dL — ABNORMAL HIGH (ref 70–99)
Glucose-Capillary: 116 mg/dL — ABNORMAL HIGH (ref 70–99)
Glucose-Capillary: 121 mg/dL — ABNORMAL HIGH (ref 70–99)
Glucose-Capillary: 122 mg/dL — ABNORMAL HIGH (ref 70–99)
Glucose-Capillary: 137 mg/dL — ABNORMAL HIGH (ref 70–99)
Glucose-Capillary: 142 mg/dL — ABNORMAL HIGH (ref 70–99)
Glucose-Capillary: 150 mg/dL — ABNORMAL HIGH (ref 70–99)
Glucose-Capillary: 173 mg/dL — ABNORMAL HIGH (ref 70–99)
Glucose-Capillary: 86 mg/dL (ref 70–99)

## 2010-05-13 LAB — PROTIME-INR
INR: 1 (ref 0.00–1.49)
Prothrombin Time: 13.4 seconds (ref 11.6–15.2)

## 2010-05-13 LAB — URINALYSIS, ROUTINE W REFLEX MICROSCOPIC
Protein, ur: NEGATIVE mg/dL
Specific Gravity, Urine: 1.021 (ref 1.005–1.030)
Urobilinogen, UA: 1 mg/dL (ref 0.0–1.0)

## 2010-05-13 LAB — COMPREHENSIVE METABOLIC PANEL
Albumin: 3.7 g/dL (ref 3.5–5.2)
BUN: 10 mg/dL (ref 6–23)
Calcium: 9.5 mg/dL (ref 8.4–10.5)
Creatinine, Ser: 0.87 mg/dL (ref 0.4–1.2)
Glucose, Bld: 99 mg/dL (ref 70–99)
Total Protein: 6.3 g/dL (ref 6.0–8.3)

## 2010-05-13 LAB — DIFFERENTIAL
Basophils Relative: 0 % (ref 0–1)
Lymphocytes Relative: 27 % (ref 12–46)
Lymphs Abs: 2.1 10*3/uL (ref 0.7–4.0)
Monocytes Relative: 14 % — ABNORMAL HIGH (ref 3–12)
Neutro Abs: 4.5 10*3/uL (ref 1.7–7.7)
Neutrophils Relative %: 57 % (ref 43–77)

## 2010-05-13 LAB — URINE MICROSCOPIC-ADD ON

## 2010-05-13 LAB — CBC
HCT: 40.6 % (ref 36.0–46.0)
Hemoglobin: 13.7 g/dL (ref 12.0–15.0)
MCHC: 33.7 g/dL (ref 30.0–36.0)
Platelets: 237 10*3/uL (ref 150–400)
RDW: 13.4 % (ref 11.5–15.5)

## 2010-05-19 ENCOUNTER — Telehealth: Payer: Self-pay | Admitting: Emergency Medicine

## 2010-05-19 NOTE — Telephone Encounter (Signed)
Called and spoke with pt.  Pt states she saw MW on 04-24-2010 and was started on Dulera.  (Also recently saw TP on 05-06-2010)  Pt states she received a letter from [facility] indicating a potential drug interaction between [facility] and Ranexa.  Pt states [facility] refused to refill her Elwin Sleight because of this.  Therefore pt is requesting the doctor's rec.  RB not in office this week.  Therefore will forward message to TP to address.  Pt is ok to wait until tomorrow for a call back.

## 2010-05-20 NOTE — Telephone Encounter (Signed)
Not sure why they can not be used together Please send to Dr. Sherene Sires  For his suggestions.  Ranexa is a cardiac drug for angina. Had not heard  That Christine Blanchard was an absolute  contraindicator for Ranexa

## 2010-05-20 NOTE — Telephone Encounter (Signed)
Dr. Wert please advise thanks 

## 2010-05-20 NOTE — Telephone Encounter (Signed)
Tolerating dulera just fine but not sure it's helping cough or breathing so ok for now to just take the sample she has 2 puffs every 12 hours as needed until she regroups with Dr Delton Coombes

## 2010-05-24 ENCOUNTER — Encounter: Payer: Self-pay | Admitting: Family Medicine

## 2010-05-26 ENCOUNTER — Encounter: Payer: Self-pay | Admitting: Family Medicine

## 2010-05-27 ENCOUNTER — Ambulatory Visit: Payer: Self-pay | Admitting: Internal Medicine

## 2010-06-03 ENCOUNTER — Telehealth: Payer: Self-pay | Admitting: Emergency Medicine

## 2010-06-03 NOTE — Telephone Encounter (Signed)
Spoke w/ pt and she states someone from [employer] [facility] will be contacting us about her portable oxygen and getting her changed to one that runs off battery. Pt states they will contact is in a couple of days. Will sign off message await [facility] call

## 2010-06-08 ENCOUNTER — Telehealth: Payer: Self-pay | Admitting: Emergency Medicine

## 2010-06-08 NOTE — Telephone Encounter (Signed)
Will forward to Lawson Fiscal to see if she has received fax. Please advise. Thanks  Carver Fila, CMA

## 2010-06-09 ENCOUNTER — Telehealth: Payer: Self-pay | Admitting: Emergency Medicine

## 2010-06-09 NOTE — Telephone Encounter (Signed)
lmomtcb x1 

## 2010-06-10 NOTE — Telephone Encounter (Signed)
Spoke w/ [facility] and they are going to refax order attn: Lawson Fiscal please advise. Thanks  Carver Fila, CMA

## 2010-06-11 ENCOUNTER — Ambulatory Visit (INDEPENDENT_AMBULATORY_CARE_PROVIDER_SITE_OTHER): Payer: Medicare Other | Admitting: Emergency Medicine

## 2010-06-11 ENCOUNTER — Telehealth: Payer: Self-pay | Admitting: Emergency Medicine

## 2010-06-11 ENCOUNTER — Encounter: Payer: Self-pay | Admitting: Emergency Medicine

## 2010-06-11 DIAGNOSIS — J302 Other seasonal allergic rhinitis: Secondary | ICD-10-CM | POA: Insufficient documentation

## 2010-06-11 DIAGNOSIS — J309 Allergic rhinitis, unspecified: Secondary | ICD-10-CM

## 2010-06-11 DIAGNOSIS — R0602 Shortness of breath: Secondary | ICD-10-CM

## 2010-06-11 MED ORDER — LORATADINE 10 MG PO TABS: 10.0000 mg | ORAL_TABLET | Freq: Every day | ORAL | Status: DC

## 2010-06-11 MED ORDER — ALBUTEROL 90 MCG/ACT IN AERS: 2.0000 | INHALATION_SPRAY | Freq: Four times a day (QID) | RESPIRATORY_TRACT | Status: DC | PRN

## 2010-06-11 NOTE — Patient Instructions (Addendum)
We will write a script for a portable oxygen concentrator Stop dulera Start  Ventolin 2 puffs as needed, up to every four hours for shortness of breath. We will teach you to use a spacer.  Continue your fluticasone nasal spray twice a day Start loratadine 10mg  (Claritin) daily Follow up with Dr Delton Coombes in 3 months or sooner if needed

## 2010-06-11 NOTE — Assessment & Plan Note (Signed)
Will add loratadine to fluticasone for her allergies

## 2010-06-11 NOTE — Telephone Encounter (Signed)
Form completed at pt OV today and faxed back to [phone].

## 2010-06-11 NOTE — Telephone Encounter (Signed)
Form was received and RB has this. He will complete form and we will fax back when it is ready. LM for Johnnette Barrios so she is aware. Will fax back as soon as form is ready.

## 2010-06-11 NOTE — Telephone Encounter (Signed)
Form completed and faxed back to [facility] at the number provided on the form [phone].

## 2010-06-11 NOTE — Assessment & Plan Note (Signed)
We will retry SABA with a spacer D/c dulera Continue O2 and gave script for portable concentrator Treat allergies

## 2010-06-11 NOTE — Progress Notes (Signed)
  Subjective:    Patient ID: Christine Blanchard, female    DOB: [birth year], 77 y.o.   MRN: [id number]  HPI 77 yo woman, never smoker, CAD/CABG, HTN, DM, CVA, OA. Followed by Dr Deborah Chalk for her CAD, HTN.  Initial eval 10/03/09 for slow progressive worsening dyspnea. Was evaluated with Lexiscan, showed mild progression of her CAD (no intervention recommended), also PFT's restriction vs mixed disease.  Sleeps sitting up because she feels a pressure in her chest when supine. Gets waked up at night. She snores some. Having hoarse voice, dyspnea with talking. Has nasal gtt, recent rx for sinusitis.   ROV 10/30/09 -- returns for dyspnea with restriction/mixed disease. CXR without infiltrates last visit. MIP and MEP performed, both decreased (40 - 46% pred). Her breathing is unchanged since last visit, doesn't believe she has the energy to do the work she wants to do. This seems to be related to weakness as opposed to overt dyspnea. FEV1 65% predicted.   ROV 12/11/09 -- follows up today for her dyspnea. PFT with mixed disease. We started O2 last time and she feels better. We also did trial of SABA to see if it helped - no real difference with the inhaler.   04/24/2010 ov cc acutely worse cough x 1 week productive minimal cream mucus and doe but not at rest, cough worse day than night.  Tx Doxy x 7 d, and Dulera   05/06/2010 Follow up OV--Returns for 2 week follow up. Last visit she had flare of cough/congestion. Tx for bronchitis with Doxycycline. She was given Elwin Sleight twice daily. She is feeling better with decreased dyspnea. Still has some cough with white/cream mucus. Decreased cough though. Does not feel as bad. Feels Elwin Sleight is helping. Energy level is slightly better. Appetitie is a little better.   06/11/10 -- follow up visit, mixed disease on PFT. As above, was seen for acute visit, complaining of cough + mucous prod + congestion in throat and chest. Was given doxy and then on Dulera - she thought the [employer]  might have helped her but she isn;t sure. She has been on and off of it, used it for increased congestion. She doesn't have a SABA at this time. Dr Sherene Sires documented that her HFA technique is poor, may explain her poor response to the med back in 11/2009. Still with cough, productive of colored mucous occasionally but not always - better than before treated with doxycycline. She has sx consistent with nasal allergies as well.    Review of Systems As per HPI    Objective:   Physical Exam Gen: Pleasant, well-nourished, in no distress,  normal affect, on O2  ENT: No lesions,  mouth clear,  oropharynx clear, no postnasal drip  Neck: No JVD, no TMG, no carotid bruits  Lungs: No use of accessory muscles, no dullness to percussion, clear without rales or rhonchi  Cardiovascular: RRR, heart sounds normal, no murmur or gallops, no peripheral edema  Musculoskeletal: No deformities, no cyanosis or clubbing  Neuro: alert, non focal  Skin: Warm, no lesions or rashes        Assessment & Plan:  DYSPNEA We will retry SABA with a spacer D/c dulera Continue O2 and gave script for portable concentrator Treat allergies  Allergic rhinitis, seasonal Will add loratadine to fluticasone for her allergies

## 2010-06-11 NOTE — Telephone Encounter (Signed)
Lawson Fiscal please advise if you received fax from [facility] as they were faxing it to triage number [phone] attn. Lori. Thanks  Carver Fila, CMA

## 2010-06-16 NOTE — Op Note (Signed)
NAMETHEADORA, NOYES                ACCOUNT NO.:  [id number]   MEDICAL RECORD NO.:  [id number]          PATIENT TYPE:  INP   LOCATION:  3041                         FACILITY:  [facility]   PHYSICIAN:  Payton Doughty, M.D.      DATE OF BIRTH:  10-07-1928   DATE OF PROCEDURE:  05/24/2008  DATE OF DISCHARGE:                               OPERATIVE REPORT   PREOPERATIVE DIAGNOSIS:  Spondylosis and spinal stenosis at L2-3 and L3-  4.   POSTOPERATIVE DIAGNOSIS:  Spondylosis and spinal stenosis at L2-3 and L3-  4.   OPERATIVE PROCEDURE:  Left-sided L2-3 and L3-4 laminotomy and  foraminotomy with undercutting and removal of the ligamentum flavum on  the right side.   SURGEON:  Payton Doughty, MD, Neurosurgery.   ANESTHESIA:  General endotracheal.   PREPARATION:  Prepped and draped with alcohol wipe.   COMPLICATIONS:  None.   NURSE ASSISTANT:  Bedelia Person, MD   DOCTOR ASSISTANT:  Hilda Lias, MD.   BODY OF TEXT:  This is a 77 year old lady with neurogenic claudication  secondary to spondylosis, taken to the operating room, smoothly  anesthetized and intubated, placed prone on the operating table.  Following shave, prep, and drape in the usual sterile fashion, skin was  infiltrated with 1% lidocaine with 1:100,000 epinephrine.  Skin was  incised from the bottom of L1 to the bottom of L3 and the lamina of L2  and L3 were exposed on the left side in the subperiosteal plane.  Intraoperative x-ray confirmed the correctness of level.  Having  confirmed the correctness of level, the high-speed drill was used to  drill out the left side of the lamina of L2 and L3 and to undercut the  spinous process over to the right side of spinal canal.  The ligamentum  flavum and the remaining bone on the left side were removed and then  reaching across the top of the dura to the other side the ligamentum  flavum from the right side was also removed.  This resulted in immediate  decompression of the spinal canal  particularly at L2-3 where the  ligament was quite robust and thick.  The spinal canal was carefully  palpated with the hook and found to be wide open from both sides.  Depo-  Medrol soaked fat was placed in the laminotomy defects.  Successive  layers of 0 Vicryl, 2-0 Vicryl, and 3-0 nylon were used to close.  Betadine and Telfa dressing was applied and made occlusive with OpSite.  The patient returned to the recovery room in good condition.           ______________________________  Payton Doughty, M.D.    MWR/MEDQ  D:  05/24/2008  T:  05/25/2008  Job:  [id number]

## 2010-06-16 NOTE — H&P (Signed)
Christine Blanchard, Christine Blanchard                ACCOUNT NO.:  [id number]   MEDICAL RECORD NO.:  [id number]          PATIENT TYPE:  INP   LOCATION:  3041                         FACILITY:  [facility]   PHYSICIAN:  Payton Doughty, M.D.      DATE OF BIRTH:  10/23/1928   DATE OF ADMISSION:  05/24/2008  DATE OF DISCHARGE:                              HISTORY & PHYSICAL   ADMITTING DIAGNOSIS:  Spinal stenosis at L2-L3, L3-L4 with neurogenic  claudication.   BODY OF TEXT:  This is a 77 year old right-handed white lady, who has  had increasing pain in her back and down her lower extremities.  She has  a bilateral lower extremity weakness when she has been up from bed.  An  MRI showed spinal stenosis at L2-L3 and L3-L4 and is admitted now for  decompressive laminectomy and foraminotomy.   MEDICAL HISTORY:  Remarkable for coronary artery disease.  She has had a  bypass and stent.   MEDICATIONS:  She is on Isordil and Plavix, Ranexa, Aciphex, Lexapro,  Januvia, Fortamet, Altace, Lipitor, Zetia, mirtazapine, Oxytrol, and  insulin.  She gets nauseated with codeine.   SOCIAL HISTORY:  She does not smoke.  Does not drink.  She is retired,  but very active, takes college courses at [employer].   FAMILY HISTORY:  Mother died of colon cancer, father died of diabetes  and heart disease.   REVIEW OF SYSTEMS:  Remarkable for glasses, cataract, chest pain,  hypertension, hypercholesteremia, shortness of breath, incontinence, arm  weakness, leg weakness, back pain, difficulty with coordination,  diabetes.  She had a transfusion with a bypass.   HEENT:  Within normal limits.  NECK:  She has a well healed scar with good range of motion.  CHEST:  Clear.  CARDIAC:  Regular rate and rhythm.  ABDOMEN:  Nontender.  No hepatosplenomegaly.  EXTREMITIES:  No clubbing or cyanosis.  Peripheral pulses are good.  GU:  Deferred.  NEUROLOGIC:  She is awake, alert, and oriented.  Pupils equal, round,  and reactive to light.  Remainder of  the cranial nerves are intact.  Motor exam shows 5/5 strength throughout the upper and lower extremities  until she gets up and walks about and then she is subjectively weak in  her legs, feels like they are giving out on her.  Lower extremities,  reflexes are absent.   MRI results has been reviewed above.   CLINICAL IMPRESSION:  Neurogenic claudication secondary to lumbar  spondylosis.   PLAN:  The plan is for going from the left side to undercut the spinous  process, remove all ligamentum flavum, but leave all the right-sided and  most of the left side bone.  The risks and benefits have been discussed  with her, and she wished to proceed.           ______________________________  Payton Doughty, M.D.     MWR/MEDQ  D:  05/24/2008  T:  05/25/2008  Job:  [id number]

## 2010-06-16 NOTE — Discharge Summary (Signed)
Christine, Blanchard                ACCOUNT NO.:  [id number]   MEDICAL RECORD NO.:  [id number]          PATIENT TYPE:  INP   LOCATION:  3041                         FACILITY:  [facility]   PHYSICIAN:  Payton Doughty, M.D.      DATE OF BIRTH:  [birth year]   DATE OF ADMISSION:  05/24/2008  DATE OF DISCHARGE:  05/28/2008                               DISCHARGE SUMMARY   ADMITTING DIAGNOSIS:  Spondylosis L2-3, L3-4.   DISCHARGE DIAGNOSIS:  Spondylosis L2-3, L3-4.   PROCEDURE:  L2-3, L3-4 laminotomy.   SURGEON:  Payton Doughty, MD, Neurosurgery.   ANESTHESIA:  General endotracheal.   COMPLICATIONS:  None.   DISCHARGE STATUS:  Alive and well by the techs.   A 77 year old right-handed white lady whose history and physical is  recounted in the chart.  She has had a history of neurogenic  claudication increased over the past year, spinal stenosis at 2-3 and 3-  4, is admitted for decompression.  Medical history is remarkable for  coronary artery disease, diabetes.  Minoxidil, Plavix, Ranexa, Aciphex,  Lexapro, Januvia, Fortamet, Altace, Lipitor, Zetia, mirtazapine,  Oxytrol, and insulin.  General exam is intact.  Neurologically, she was  intact until she had been up and about and she had weakness of the legs  and a classic pattern of neurogenic claudication.  She was admitted  after ascertainment of normal laboratory values and underwent a left-  sided decompression at 2-3 and 3-4 region across the dorsal part of the  thecal sac to remove the right-sided ligamentum flavum.  Postoperatively, she has done extremely well.  Her strength is full.  She had a day or two to recover from her incisional back pain.  Foley  was removed and she is eating and voiding normally now.  Strength is  full.  Incision is dry and well healing.  She is being discharged home  to the care of her family on Darvocet for pain.  Her followup will be in  the [facility] offices in about 10 days for suture removal.      ______________________________  Payton Doughty, M.D.     MWR/MEDQ  D:  05/28/2008  T:  05/28/2008  Job:  [id number]

## 2010-06-19 NOTE — Discharge Summary (Signed)
Christine Blanchard, Christine Blanchard                          ACCOUNT NO.:  [id number]   MEDICAL RECORD NO.:  [id number]                   PATIENT TYPE:  INP   LOCATION:  3732                                 FACILITY:  [facility]   PHYSICIAN:  Colleen Can. Deborah Chalk, M.D.            DATE OF BIRTH:  Feb 11, 1928   DATE OF ADMISSION:  05/21/2002  DATE OF DISCHARGE:  05/22/2002                                 DISCHARGE SUMMARY   PRIMARY DISCHARGE DIAGNOSIS:  Recurrent  angina with need for coronary  artery bypass grafting.   SECONDARY DISCHARGE DIAGNOSES:  1. Atherosclerotic cardiovascular disease with previous cardiac     catheterization on May 02, 2002, demonstrating normal right heart     pressures, normal left ventricular function, moderately severe     atherosclerosis predominantly in the left anterior descending system with     failure of medical therapy.  2. Hypertensive heart disease.  3. Diabetes since August of 2003.  4. Dyslipidemia.  5. History of menopause.  6. Stress incontinence.  7. Anxiety.  8. Macular degeneration.  9. Mild depression.  10.      History of osteoarthritis.  11.      History of benign essential tremor.   HISTORY OF PRESENT ILLNESS:  The patient is a 77 year old female who has  recently had cardiac catheterization due to intermittent episodes of chest  pain as well as spells of weakness.  We initially tried medical therapy,  however, she has continued to have multiple bouts of chest pain with  basically minimal exertion.  She has had no symptoms at rest.  She was seen  in the office on May 21, 2002, and was referred for admission for  stabilization of symptoms.   Please see the dictated history and physical for further patient  presentation and profile.   LABORATORY DATA ON ADMISSION:  Hematocrit 37, white count 8, platelets 154.  Chemistries were normal.  Glucose slightly elevated at 116.  B peptide 32.5.  Cardiac enzymes negative.   Chest x-ray is pending  final dictation.   HOSPITAL COURSE:  The patient was admitted.  She was placed on IV  nitroglycerin as well as IV heparin.  Her Plavix has been placed on hold.  Arrangements have been made for her to be transferred to [facility] where she wishes to undergo coronary artery bypass grafting per Dr.  Romona Curls.  She has continued to have recurrent mild episodes of chest pain  requiring increase in IV nitroglycerin as well as sublingual nitrate  therapy.  Cardiac enzymes have remained negative.  At this point in time the  patient is felt to be a stable candidate for transfer to [facility] for upcoming coronary artery bypass grafting.   CONDITION ON TRANSFER:  Stable.   SPECIAL INSTRUCTIONS:  The patient will be advised to follow up in our  office in approximately two to three weeks following  bypass surgery.  Otherwise care at this time will be deferred to Dr. Romona Curls at [facility].     Juanell Fairly C. Earl Gala, N.P.                 Colleen Can. Deborah Chalk, M.D.    LCO/MEDQ  D:  05/22/2002  T:  05/22/2002  Job:  [id number]   cc:   Veverly Fells. Altheimer, M.D.  [address]  [place]  [place] [address]  Fax: [phone]   Demetrios Loll, M.D.  [facility]  [place], [place]

## 2010-06-19 NOTE — Op Note (Signed)
[place]  Patient:    Christine Blanchard, Christine Blanchard                         MRN: [id number] Proc. Date: 08/25/00 Adm. Date:  08/25/00 Attending:  Payton Doughty, M.D.                           Operative Report  PREOPERATIVE DIAGNOSIS:  Herniated disk at C6-C7 on the right with a right C7 radiculopathy.  POSTOPERATIVE DIAGNOSIS:  Herniated disk at C6-C7 on the right with a right C7 radiculopathy.  OPERATION PERFORMED:  Anterior cervical diskectomy and fusion at C6-C7 with tether plate.  SURGEON:  Payton Doughty, M.D.  ANESTHESIA:  General endotracheal.  PREP:  Sterile Betadine prep and scrub with alcohol wipe.  COMPLICATIONS:  None.  ASSISTANT:  Tanya Nones. Jeral Fruit, M.D.  DESCRIPTION OF PROCEDURE:  The patient is a 77 year old right-handed white female with right C7 radiculopathy and herniated disk at 6-7.  The patient was taken to the operating room, smoothly anesthetized and intubated, and placed supine on the operating table in halter head traction with the neck extended. Following shave, prep and drape in the usual sterile fashion, the skin was incised in the midline to the medial border of the sternocleidomastoid muscle on the left side.  The platysma was identified, elevated, divided and undermined.  The sternocleidomastoid was identified and medial dissection revealed the carotid artery retracted laterally to the left, trachea and esophagus retracted laterally to the right, exposing the bones of the anterior cervical spine.  A marker was placed and intraoperative x-ray was obtained to confirm correctness of the level.  Having confirmed correctness of the level, diskectomy was carried out first under gross observation and then using the operating microscope.  The operating microscope was brought in and microdissection technique was used to remove the remaining disk and dissect the anterior epidural space removing the posterior longitudinal ligament.   In the right neural foramen, the nerve root was identified as well as herniated disk compressing the root.  This was removed without difficulty.  Following complete decompression of the nerve root, the wound was irrigated and hemostasis assured.  A 7 mm bone graft was fashioned from patellar allograft and tapped into place.  A 12 mm tether plate was then placed with 12 mm screws, two in C6 and two in C7.  Intraoperative x-ray showed good placement of screws, plate and bone graft.  The wound was once again irrigated and hemostasis assured.  The platysma was reapproximated with 3-0 Vicryl in interrupted fashion.  Subcutaneous tissues were reapproximated with 3-0 Vicryl in interrupted fashion.  Skin was closed with 4-0 Vicryl in a running subcuticular fashion.  Benzoin and Steri-Strips were placed and made occlusive with Telfa and OpSite.  The patient then returned to the recovery room in good condition. DD:  08/25/00 TD:  08/26/00 Job: [id number]

## 2010-06-19 NOTE — Discharge Summary (Signed)
Christine Blanchard, Christine Blanchard                ACCOUNT NO.:  [id number]   MEDICAL RECORD NO.:  [id number]          PATIENT TYPE:  OIB   LOCATION:  6527                         FACILITY:  [facility]   PHYSICIAN:  Colleen Can. Deborah Chalk, M.D.DATE OF BIRTH:  Jun 16, 1928   DATE OF ADMISSION:  01/08/2004  DATE OF DISCHARGE:  01/09/2004                                 DISCHARGE SUMMARY   PRIMARY DISCHARGE DIAGNOSIS:  Abnormal stress Cardiolite study with  recurrent episodes of exertional angina with now status post elective  cardiac catheterization as well as stent placement to a bifurcating left  anterior descending lesion with a 2.5 x 23 mm stent.   SECONDARY DISCHARGE DIAGNOSES:  1.  Known atherosclerotic cardiovascular disease.  Previous history of      bypass grafting in April 2004 with left internal mammary artery to the      left anterior descending and a saphenous vein graft to the diagonal.      She has had previous stent placement to the intermediate in December of      2004.  2.  Stable angina.  3.  Hypertension.  4.  Diabetes currently uncontrolled.  5.  Hyperlipidemia.  6.  Chronic anxiety.  7.  Osteoarthritis.  8.  Macular degeneration.  9.  Stress incontinence.  10. Previous stroke following catheterization of January 2005.  11. Recent fall with a negative CT scan, negative Holter monitor.   HISTORY OF PRESENT ILLNESS:  The patient is a 77 -year-old female who has  multiple medical problems.  She presents for elective repeat cardiac  catheterization.  She has had a longstanding history of ischemic heart  disease and has had bypass grafting in April 2004.  She had to undergo  repeat catheterization in December of 2004 for continued bouts of angina.  She subsequently had a stent placed to the intermediate.  She had a follow  up catheterization in January of 2005 which was complicated by  postprocedural stroke.  She has basically resolved from that standpoint.  She presents with recurrent  episodes of angina that are exertional in  nature.  She has had an abnormal stress Cardiolite study performed.  There  has also been concern for the possibility for congestive heart failure;  however, BNP has been normal and 2-D echocardiogram has basically been  satisfactory.  She subsequently was referred for repeat catheterization.   Please see the dictated History and Physical for further patient  presentation and profile.   LABORATORY DATA:  On admission her chemistries were normal except for a  glucose of 129.  PT and PTT were unremarkable.  BNP was 48.  CBC was  basically normal.   HOSPITAL COURSE:  The patient was admitted electively.  She underwent  cardiac catheterization on January 08, 2004.  That procedure was tolerated  well without any known complications.  Please see the full dictated report  for final findings.  Subsequent stent placement was performed to the LAD at  a bifurcating lesion.  The lesion was predilated with a 2.0 x 20 mm Quantum  Maverick balloon.  The ostium of the  diagonal had a 2.5 x 10 mm cutting  balloon angioplasty performed.  A subsequent stent, 2.5 x 23 mm CYPHER, was  placed in the proximal LAD with overall satisfactory result obtained.  Postprocedure she was transferred to 6500 and today, on January 09, 2004,  she is doing well without complaints.  Her a.m. labs are currently pending  but overall she is felt to be a stable candidate for discharge today.   DISCHARGE CONDITION:  Stable.   DISCHARGE MEDICATIONS:  She will resume all of her previous medicines that  she was taking before with no changes made at this time; however, we will  hold off on restarting her Fortamet until Saturday based on follow up  chemistry studies.  She will continue with her combination of aspirin and  Plavix indefinitely.   DISCHARGE ACTIVITIES:  Is to be progressive.   WOUND CARE:  She is to place a Band-Aid if needed with Neosporin to the  puncture site.  We will  have her see Dr. Roger Shelter on Friday, December  16th, at 9 a.m., certainly sooner if problems arise.       LC/MEDQ  D:  01/09/2004  T:  01/09/2004  Job:  [id number]   cc:   Veverly Fells. Altheimer, M.D.  [address], [address]  [place]  [place] [address]  Fax: [phone]

## 2010-06-19 NOTE — Cardiovascular Report (Signed)
NAME:  Christine Blanchard, KEREKES                          ACCOUNT NO.:  [id number]   MEDICAL RECORD NO.:  [id number]                   PATIENT TYPE:  OIB   LOCATION:  2852                                 FACILITY:  [facility]   PHYSICIAN:  Colleen Can. Deborah Chalk, M.D.            DATE OF BIRTH:  08/01/1928   DATE OF PROCEDURE:  DATE OF DISCHARGE:                              CARDIAC CATHETERIZATION   INDICATIONS:  The patient has diabetes by notes in August of 2003, with a  history of hypercholesterolemia and hypertension.  She was referred for  evaluation of intermittent chest discomfort, weakness, and shortness of  breath.  She had congestive heart failure related to Actos use.   PROCEDURE:  Left and right heart catheterization with selective coronary  angiography and left ventricular angiography.   SITE OF ENTRY:  Percutaneous right femoral artery, percutaneous right  femoral vein.   CATHETER:  A 7-French Swan-Ganz thermodilution catheter, 6-French 4-curved  Judkins right and left coronary catheters, 6-French pigtail  ventriculographic catheter.   CONTRAST:  Pure Omnipaque.   MEDICATIONS GIVEN PRIOR TO PROCEDURE:  Valium 10 mg p.o.   MEDICATIONS GIVEN DURING THE PROCEDURE:  Versed 2 mg IV.   COMMENTS:  The patient tolerated the procedure well.   HEMODYNAMIC DATA:  The right atrial pressure was A-wave of 5, V-wave of 3,  mean of 2.  RV was 20/1-3.  Pulmonary artery was 17/4-11.  Pulmonary  capillary wedge pressure A of 9, V of 7, mean of 6.  LV was 109/1-6.  Aortic  pressure was 114/59-84.   The aortic sat was 91%.  The pulmonary artery sat was 63%.  Fick cardiac  output was 3.5 with a cardiac index of 2.1.  Thermodilution cardiac output  was 4.8 with a cardiac index of 2.8.   The left ventricular angiogram was performed in the RAO position and  revealed overall cardiac size and silhouette were normal.  The global  ejection fraction was 60%.  Regional wall motion was normal.   CORONARY  ARTERIES:  The coronary arteries arise and distribute normally.  1. Left main coronary artery is normal.  2. Left anterior descending:  The left anterior descending is approximately     a 2.5 to 3-mm vessel proximally.  At the level of the first diagonal     vessel, the left anterior descending essentially bifurcates.  There is at     least 50% narrowing at this segment of bifurcation and then the area of     atherosclerosis continues on into the structural left anterior descending     for approximately 2 to 3 cm.  Just past the first septal perforating     branch,  this 50% narrowing is reduced to approximately 70 to 80%     narrowing.  This appears to be the most significant of the lesions.  The     distal left anterior descending is 2  mm in diameter.  The diagonal vessel     has disease in its origin.  It continues on to bifurcate and one of these     branches of bifurcation has a 50 to 70% narrowing.  3. Intermediate:  The intermediate is a small vessel.  It has diffuse     disease with a 70 to 80% focal narrowing.  It is a small vessel, being     less than 2 mm in diameter.  4. The left circumflex is a moderate-size to large vessel.  It has a large     posterolateral branch.  It has a 40 to 50% narrowing before the distal     bifurcations.  Otherwise, the vessel has irregularities.  5. Right coronary artery:  The right coronary artery is a dominant vessel.     There is a 40 to 50% narrowing proximally.  There is a posterior     descending vessel and posterolateral branch with irregularities, but no     significant disease.   OVERALL IMPRESSION:  1. Normal right heart pressures.  2. Normal left ventricular function.  3. Moderately severe atherosclerosis predominantly in the left anterior     descending system with segmental 70 to 80% narrowings in the left     anterior descending, post first septal perforator branch with 50 to 60%     narrowings at the bifurcation of the left  anterior descending, and a     large first diagonal system, and 70+% narrowing in a bifurcation branch     of this diagonal system, with 70 to 80% stenosis in a small intermediate     coronary.  4. Mild to moderate coronary atherosclerosis in the right coronary artery     and left circumflex sytem.   DISCUSSION:  At this point in time, I think the most appropriate action is  to try to manage the patient medically.  She is not a good candidate for  percutaneous intervention with the small vessels and bifurcation lesions.  She may eventually be a candidate for bypass surgery versus higher risk  angioplasty that would have an increased risk of restenosis.                                               Colleen Can. Deborah Chalk, M.D.    SNT/MEDQ  D:  05/02/2002  T:  05/02/2002  Job:  [id number]   cc:   Veverly Fells. Altheimer, M.D.  [address], [address]  [place]  [place] [address]  Fax: [phone]   Nichola Sizer  [facility]

## 2010-06-19 NOTE — Cardiovascular Report (Signed)
Christine Blanchard, Christine Blanchard                          ACCOUNT NO.:  [id number]   MEDICAL RECORD NO.:  [id number]                   PATIENT TYPE:  OIB   LOCATION:  6526                                 FACILITY:  [facility]   PHYSICIAN:  Colleen Can. Deborah Chalk, M.D.            DATE OF BIRTH:  July 31, 1928   DATE OF PROCEDURE:  01/04/2003  DATE OF DISCHARGE:                              CARDIAC CATHETERIZATION   HISTORY:  Christine Blanchard is a 77 year old female with previous coronary artery  bypass grafting at [facility] for persistent anginal pain.  She had a  saphenous vein graft to the diagonal system and a left internal mammary  graft to the LAD.  There was a small intermediate vessel that was left  unbypassed.  She was referred for catheterization after having recurrent  anginal pain.   PROCEDURE:  Left heart catheterization with selective coronary angiography,  left ventricular angiography, saphenous vein graft angiography x1,  angiography to the left internal mammary artery, stent placement to the  intermediate coronary.   TYPE AND SITE OF ENTRY:  Percutaneous, right femoral artery.   CATHETERS:  6-French 4 curved Judkins right and left coronary catheters, 6-  [place] internal mammary graft catheter, 6-French no-torque right coronary  catheter, 6-French JL4 guide, high-torque floppy guidewire, 2.0 x 20 mm  Maverick and subsequently a 2.5 x 23 mm Cypher stent.   CONTRAST MATERIAL:  Omnipaque.   MEDICATIONS GIVEN PRIOR TO THE PROCEDURE:  Valium 10 mg p.o.   MEDICATIONS GIVEN DURING THE PROCEDURE:  Versed 2 mg IV, heparin,  Integrilin, and IV nitroglycerin.   COMMENTS:  The patient tolerated the procedure well.   HEMODYNAMIC DATA:  1. The aortic pressure was 119/64.  2. LV was 129/6-17.  3. There was no aortic valve gradient present on pullback.   ANGIOGRAPHIC DATA:  1. Left main coronary artery is long and normal.  2. Left circumflex has irregularities, but no significant focal disease.  3. Intermediate coronary:  There is a moderate sized intermediate coronary.     Appeared to be 2.0 mm in size initially, but with 70-80% narrowing.  4. Left anterior descending has severe stenosis at the bifurcation of the     first diagonal vessel and continuation of the left anterior descending.     There is bidirectional filling of the diagonal vessel in particular and     to some extent into the left anterior descending proper.  5. Right coronary artery is a dominant vessel.  There are scattered     irregularities and small nicks of atherosclerosis noted but overall the     vessel is free of significant disease.  6. Saphenous vein graft to the diagonal vessel has some irregularity of the     vein.  There is a large valve in the mid portion of the body of the     graft.  The insertion site is satisfactory and there is  good distal     runoff.  7. Left internal mammary artery graft to the LAD is somewhat atretic but it     has a nice insertion site.  It is relatively small in diameter, but     appears to have satisfactory flow.   LEFT VENTRICULOGRAM:  Left ventricular angiogram is performed in the RAO  position.  Overall cardiac size and silhouette are normal.  Global left  ventricular function is normal.   ANGIOPLASTY PROCEDURE:  We turned our attention to the intermediate  coronary.  Using a JL4 guide and a high-torque floppy guidewire we were able  to position the wire across the stenosis.  We pre dilated with a 2.0 x 20 mm  Maverick balloon and then positioned a 2.5 x 23 mm Cypher stent across the  stenosis.  This was inflated to a maximum of 13 atmospheres.  The final  angiographic result was excellent with a smooth contour of the vessel and  excellent distal flow.   OVERALL IMPRESSION:  1. Normal left ventricular function.  2. Severe stenosis in the intermediate coronary artery with satisfactory     stent placement.  3. Severe stenosis in the left anterior descending with  patent saphenous     vein graft to the diagonal with patent left internal mammary to the     distal left anterior descending.  4. Mild irregularities of the left circumflex and right coronary arteries.   DISCUSSION:  Hopefully, this will leave Christine Blanchard angina free.  It is felt  that her left internal mammary artery graft will mature and be a larger  vessel in the future.                                               Colleen Can. Deborah Chalk, M.D.    SNT/MEDQ  D:  01/04/2003  T:  01/05/2003  Job:  [id number]

## 2010-06-19 NOTE — H&P (Signed)
NAME:  Christine, Blanchard                          ACCOUNT NO.:  [id number]   MEDICAL RECORD NO.:  [id number]                   PATIENT TYPE:  INP   LOCATION:  3001                                 FACILITY:  [facility]   PHYSICIAN:  Marlan Palau, M.D.               DATE OF BIRTH:  07-16-1928   DATE OF ADMISSION:  03/02/2003  DATE OF DISCHARGE:                                HISTORY & PHYSICAL   HISTORY OF PRESENT ILLNESS:  Christine Blanchard is a 77 year old right-handed  white female born 10-Aug-1928, with a history of coronary artery disease.  This patient has had a CABG procedure in April 2004. She claims she had a  carotid Doppler study that was unremarkable at that time. The patient had a  coronary artery stenting procedure done in or around the 3rd of December  2004. The patient returns for diagnostic catheterization of the heart that  was done yesterday. The patient apparently did well following  catheterization, was able to use both upper extremities well to feed herself  after the procedure. The patient however noted that within three hours after  the catheterization she began to have some clumsiness involving the right  hand with numb, tingly sensations involving that hand and some paresthesias  of the right face, some word finding  problems that developed. The patient  denied headache or visual field deficits. Maybe some gait instability was  noted. The patient initially thought that this was related to the Valium  that was given for the procedure. The patient, however, continued to have  her deficits overnight and into the morning today.  The patient was brought  in through the emergency room for an evaluation. CAT scan of the brain was  relatively unremarkable. Neurology was asked to see this patient for further  evaluation.   PAST MEDICAL HISTORY:  1. History of new onset right hand numbness, clumsiness. Rule out     subcortical left brain stroke.  2. Coronary artery disease,  status post catheterization on March 01, 2003.  3. Stent procedure around the 3rd of December 2004.  4. History of CABG procedure in April 2004.  5. Diabetes.  6. Hypertension.  7. Hypercholesterolemia.  8. Cervical spine surgery in the past by Dr. Wynonia Hazard.  9. Gallbladder surgery.  10.      Appendectomy.  11.      Diverticulosis.   MEDICATIONS:  1. Lipitor 10 mg daily.  2. Zetia 10 mg a day.  3. Glucophage, which he is not currently taking, 1000 mg a day.  4. Plavix 75 mg daily.  5. Toprol XL 25 mg a day.  6. Altace 5 mg a day.  7. Lexapro 10 mg a day.  8. Indapamide 1.25 mg a day.  9. Levsinex 0.375 mg a day.  10.      Sonata if needed for sleep.   ALLERGIES:  CODEINE.  She does not currently smoke or drink.   SOCIAL HISTORY:  This patient lives in the [place] area.  She is a widow. She has two children, both alive and well. The patient has  one son and one daughter.   FAMILY MEDICAL HISTORY:  Mother died with colon cancer. Father died with  hardening of the arteries. The patient has one sister with diabetes.  She  has a brother who died at age 61 with cancer and another brother died  following a motor vehicle accident at age 84.   REVIEW OF SYSTEMS:  Notable for no fever or chills. The patient has no  visual field changes, double vision, trouble swallowing, has had some word  finding problems, and paresthesias right face. Denies neck pain. Has had  some recent chest pains. Denies shortness of breath. Has had some slight  nausea today. No vomiting. No trouble controlling the bowel or bladder.  Slight gait imbalance is noted.   PHYSICAL EXAMINATION:  VITAL SIGNS: Blood pressure 109/72, heart rate 76,  respiratory rate 20, temperature afebrile.  GENERAL: This patient is a fairly well-developed white female who is alert  and cooperative at the time of examination.  HEENT: Head is atraumatic.  Pupils equal, round, and reactive to light. Disks are  flat bilaterally.  NECK: Supple. No carotid bruits noted.  RESPIRATORY: Clear.  CARDIAC: Regular rate and rhythm with no obvious murmurs or rubs noted.  ABDOMEN: Positive bowel sounds with no organomegaly or tenderness noted.  EXTREMITIES: Without significant edema.  NEUROLOGIC: Cranial nerves as above. Facial symmetry is present. The patient  notes some slight decrease in pinprick sensation in the right face compared  to the left. Has good strength to facial muscles and muscles of head, trunk,  and shoulder shrug bilaterally.  Again, visual field testing and double  simultaneous stimulation is symmetric and normal. Speech is well enunciated  and not aphasic.  Motor testing reveals trace weakness in the intrinsic  muscles of the right hand. The patient otherwise has good strength in all  fours. Clumsiness is noted with rapid alternating movements on both upper  extremities, but the right more so than the left.  The patient has fair heel-  to-shin and is able to ambulate without assistance. Poor tandem gait is  noted. Romberg is negative. No pronator drift is seen. Deep tendon reflexes  are relatively symmetric. Normal toes neutral bilaterally.   CT scan of the head again is unremarkable. Blood work is pending.   IMPRESSION:  New onset right upper extremity clumsiness paresthesias. Rule  out subcortical left brain cerebrovascular event.  The patient has minimal  deficits at this point, but reports new problems since catheterization  yesterday. The deficits did not occur during the catheterization but several  hours later.  Need to rule out intra- or extracranial stenosis of vessels  supplying the left brain, rule out dissection following catheterization.  Will see further workup at this point.   PLAN:  1. The patient will be admitted for evaluation.  2. MRI of the brain.  3. MRA of the intracranial and extracranial vessels.  4. Admission blood work.  5. Continue Plavix. 6. The  patient may require outpatient occupational therapy.  7. If the MRI studies are relatively unremarkable the patient may be     discharged on the Plavix.  Marlan Palau, M.D.    CKW/MEDQ  D:  03/02/2003  T:  03/02/2003  Job:  [id number]   cc:   Lyn Records III, M.D.  [address]  [place]  [place] [address]  Fax: [phone]   Veverly Fells. Altheimer, M.D.  [address]  [place]  [place] [address]  Fax: [phone]   [facility]  [address]

## 2010-06-19 NOTE — Cardiovascular Report (Signed)
Christine Blanchard, Christine Blanchard                ACCOUNT NO.:  [id number]   MEDICAL RECORD NO.:  [id number]          PATIENT TYPE:  INP   LOCATION:  4729                         FACILITY:  [facility]   PHYSICIAN:  Colleen Can. Deborah Chalk, M.D.DATE OF BIRTH:  [birth year]   DATE OF PROCEDURE:  06/16/2004  DATE OF DISCHARGE:                              CARDIAC CATHETERIZATION   HISTORY:  [name] is a 77 year old female with known coronary disease with  stent placed in the left anterior descending  in December 2005.  She  presents with recurrent chest pain and is referred for catheterization.   PROCEDURE:  Left heart catheterization with selective coronary angiography,  left ventriculography.   TYPE AND SITE OF ENTRY:  Percutaneous right femoral artery with Angioseal.   CATHETER:  6 French 4 curved Judkins right and left coronary catheters, 6  French pigtail ventriculographic catheter.   CONTRAST MATERIAL:  Omnipaque.   MEDICATIONS GIVEN PRIOR TO THE PROCEDURE:  Valium 5 mg p.o.   MEDICATIONS GIVEN DURING THE PROCEDURE:  Versed 2 mg IV, Ancef 1 gram IV.   COMMENTS:  The patient tolerated the procedure well.   HEMODYNAMIC DATA:  The aortic pressure was 137/69, LV pressure 138/10-22,  there is no aortic valve gradient noted on pull back.   ANGIOGRAPHIC DATA:  1. Left main coronary artery is normal.   Left circumflex had mild irregularities.  There was a large bifurcating  obtuse marginal vessel.   1. Left anterior descending was widely patent with a satisfactory stent      placement and satisfactory distal run off.  There were mild      irregularities distally.  There was adequate flow and competitive flow      into the diagonal system.   1. The intermediate coronary is patent, the stent is satisfactory with      adequate flow, there were  mild irregularities distally.   1. Right coronary artery has a 40% narrowing proximally and 20% in the mid      portion.  The distal run off is excellent.   1. Saphenous vein graft to the diagonal vessel has a valve in the body of      the graft.  However, the flow into the diagonal system and retrograde      flow into the bifurcating branch of the diagonal system is all      satisfactory with satisfactory flow.   1. 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   1. Left ventricular angiogram was performed in the RAO position.  The      overall cardiac size and silhouette are normal.  The global ejection      fraction is 60%.  IMPRESSION:  1. Normal left ventricular function.  2. Persistent patency of the stents in the left anterior descending  and      intermediate coronary with saphenous vein graft patent into the      bifurcating diagonal vessel with minimal atherosclerosis in the right      coronary artery and left circumflex systems.     DISCUSSION:  These findings would all suggest that [name] has an excellent  prognosis.  Hopefully, she will become asymptomatic.      SNT/MEDQ  D:  06/16/2004  T:  06/16/2004  Job:  [id number]   cc:   Veverly Fells. Altheimer, M.D.  [address], [address]  [place]  [place] [address]  Fax: [phone]

## 2010-06-19 NOTE — H&P (Signed)
NAME:  Christine Blanchard, Christine Blanchard                         ACCOUNT NO.:  [id number]   MEDICAL RECORD NO.:  [id number]                   PATIENT TYPE:  OIB   LOCATION:                                       FACILITY:  [facility]   PHYSICIAN:  Colleen Can. Deborah Chalk, M.D.            DATE OF BIRTH:  Mar 11, 1928   DATE OF ADMISSION:  01/04/2003  DATE OF DISCHARGE:                                HISTORY & PHYSICAL   CHIEF COMPLAINT:  Recurrent chest pain.   HISTORY OF PRESENT ILLNESS:  Christine Blanchard is a 77 year old white female who has  multiple medical problems.  She has had coronary artery bypass grafting x2  (off pump) at [facility] in April of 2004.  She initially did  well; however, has now begun to have recurrent episodes of chest pain,  requiring nitroglycerin.  She has pain between her shoulder blades which is  identical to her previous chest pain syndrome.  She has had fatigue as well.  She has not had shortness of breath.  She has had repeat stress testing  which was felt to be stable, however, clinically she has continued to have  episodes of intermittent chest discomfort.  She is now referred for elective  cardiac catheterization.   ALLERGIES:  CODEINE and ACTOS.   CURRENT MEDICATIONS:  1. Metformin ER 1000 mg a day.  2. Altace 5 daily.  3. Zetia 10 mg a day.  4. Aspirin daily.  5. Toprol XL 25 half tablet daily.  6. Estrace cream three times a week.  7. Diapamide 1.25 daily.  8. Remeron 15 mg half tablet daily.  9. Lipitor 10 daily.  10.      Lexapro 10 daily.   PAST MEDICAL HISTORY:  1. Atherosclerotic cardiovascular disease with original cardiac     catheterization in March of 2004; subsequent coronary artery bypass     grafting x2 (off pump) at [facility] with left internal mammary     artery to the LAD and the vein graft to the first diagonal.  2. Hypertension.  3. Diabetes.  4. Hyperlipidemia.  5. Stress incontinence.  6. Diverticulosis.  7. Anxiety.  8. Macular  degeneration.  9. Osteoarthritis.  10.      History of cervical surgery in July of 2002.   FAMILY HISTORY:  Both of her parents are deceased.  Father died at 40 with  heart disease and hypertension.  Mother died at 7 with hypertension and  colon cancer.   SOCIAL HISTORY:  She is a widow.  She lives at home alone.  She has had no  tobacco use and rare alcohol use.   REVIEW OF SYMPTOMS:  Basically as noted above and is otherwise unremarkable.   PHYSICAL EXAMINATION:  GENERAL APPEARANCE:  She is somewhat anxious but  currently in no acute distress.  VITAL SIGNS:  Blood pressure 134/82 sitting, 136/86 standing.  Heart rate 64  and  regular, respiratory rate 18, she is afebrile.  SKIN:  Warm and dry.  Color is unremarkable.  HEENT:  Unremarkable.  LUNGS:  Basically clear.  CARDIOVASCULAR:  Regular rhythm.  ABDOMEN:  Soft, positive bowel sounds, nontender.  EXTREMITIES:  No edema.  NEUROLOGIC:  Intact.  There are no gross focal deficits.   PERTINENT LABORATORY DATA:  Pending.   IMPRESSION:  1. Recurrent bouts of chest pain, questionable angina equivalent.  2. Recent coronary artery bypass grafting x2 (off pump) at [facility] in April of 2004.  3. Diabetes.  4. Hypertension.  5. Hyperlipidemia.  6. Anxiety and depression.   PLAN:  Will proceed on with repeat arteriograms.  The procedure has been  reviewed in full detail and she is willing to proceed on Friday, January 04, 2003.      Juanell Fairly C. Earl Gala, N.P.                 Colleen Can. Deborah Chalk, M.D.    LCO/MEDQ  D:  12/31/2002  T:  12/31/2002  Job:  [id number]   cc:   Veverly Fells. Altheimer, M.D.  [address], [address]  [place]  [place] [address]  Fax: [phone]

## 2010-06-19 NOTE — Discharge Summary (Signed)
NAMESYBIL, SHRADER                          ACCOUNT NO.:  [id number]   MEDICAL RECORD NO.:  [id number]                   PATIENT TYPE:  OIB   LOCATION:  2894                                 FACILITY:  [facility]   PHYSICIAN:  Colleen Can. Deborah Chalk, M.D.            DATE OF BIRTH:  1928-11-05   DATE OF ADMISSION:  01/04/2003  DATE OF DISCHARGE:                                 DISCHARGE SUMMARY   PRIMARY DISCHARGE DIAGNOSIS:  Recurrent episodes of chest pain responsive to  nitroglycerin with subsequent elective cardiac catheterization and stent  placement to the intermediate coronary vessel (2.5 x23 mm CYPHER stent).   SECONDARY DISCHARGE DIAGNOSES:  1. Previous coronary artery bypass grafting (off pump), in April 2004, with     left internal mammary to the LAD and vein graft to the first diagonal.  2. Hypertension.  3. Diabetes mellitus.  4. Hyperlipidemia.  5. Chronic anxiety.   HISTORY OF PRESENT ILLNESS:  The patient is a 77 year old female who has had  previous and recent coronary artery bypass grafting off pump  x2 at [facility] in April of 2004.  Initially, she had done well.  However, the  last several weeks, she has begun having recurrent episodes of chest pain  requiring nitroglycerin.  She is subsequently referred for cardiac  catheterization.   Please see the dictated history and physical for further patient  presentation and profile.   LABORATORY DATA:  CBC was normal.  PT and PTT were unremarkable.  Chemistries were satisfactory except for a glucose of 112.   HOSPITAL COURSE:  The patient was admitted electively in order to undergo  coronary angiography.  That procedure was performed without any known  complications.  LV function was normal.  Left main was normal.  The left  circumflex had irregularity.  There was a 70-80% sequential narrowing in the  intermediate, and subsequent PCI was performed with stent placement.  The  LAD had a 90% narrowing after the first  septal perforating branch with a  small but patent left internal mammary distally.  The first diagonal has a  70 to 80% narrowing with patent saphenous vein graft.  Post-procedure, she  was transferred to 6500 and plans are now made for her to be discharged in  the morning if deemed stable on the a.m. rounds.   DISCHARGE MEDICATIONS:  She will resume all of her previous medicines except  for the metformin which we will hold off until next week.  She is to have  laboratory work checked on Monday.  We will add back Plavix 75 mg  a day for the next six months.  Her activities should be light for the next  7-10 days.  She is to place an ice pack as needed to the groin.  Otherwise,  we will plan on seeing her in the office in two to three weeks, and  certainly sooner if problems arise.  Juanell Fairly C. Earl Gala, N.P.                 Colleen Can. Deborah Chalk, M.D.    LCO/MEDQ  D:  01/04/2003  T:  01/05/2003  Job:  [id number]   cc:   Veverly Fells. Altheimer, M.D.  [address]  [place]  [place] [address]  Fax: [phone]

## 2010-06-19 NOTE — Discharge Summary (Signed)
NAMEFLOREE, ZUNIGA                ACCOUNT NO.:  [id number]   MEDICAL RECORD NO.:  [id number]          PATIENT TYPE:  INP   LOCATION:  4729                         FACILITY:  [facility]   PHYSICIAN:  Colleen Can. Deborah Chalk, M.D.DATE OF BIRTH:  [birth year]   DATE OF ADMISSION:  06/15/2004  DATE OF DISCHARGE:  06/16/2004                                 DISCHARGE SUMMARY   PRIMARY DISCHARGE DIAGNOSIS:  Chest pain with subsequent negative cardiac  enzymes, with subsequent elective cardiac catheterization showing normal  left ventricular function, persistent patency of the stents previously  placed in the left anterior descending and intermediate coronary, with  saphenous vein graft patency into the bifurcating diagonal vessel, with  minimal atherosclerosis in the right coronary artery as well as left  circumflex systems.  These findings would suggest that Mrs. Ragan has an  excellent prognosis and she will continue with medical management.   SECONDARY DISCHARGE DIAGNOSES:  1. Extensive history of atherosclerotic cardiovascular disease.  She has      had previous off-pump coronary artery bypass grafting in April 2004      with left internal mammary to the left anterior descending and a vein      graft to the diagonal.  She does have a known atretic left internal      mammary graft.  2. Previous stent placement to the intermediate in December of 2004.  3. Previous catheterization, January of 2005, which results in a post-      procedural stroke.  4. History of stent placement to a bifurcating left anterior descending      lesion along with angioplasty in December of 2005.  5. Longstanding diabetes.  6. Orthostatic hypotension.  7. Hyperlipidemia.  8. Chronic anxiety.  9. Osteoarthritis.     HISTORY OF PRESENT ILLNESS:  The patient is a 77 year old female who has  multiple medical problems.  She presents with an episode of back pain, which  is her angina equivalent; it actually awoke her at  approximately 5:30 in the  morning.  She took nitroglycerin x3 with minimal relief.  She was instructed  to go to the emergency room, where she was subsequently seen and evaluated,  and admitted for further evaluation.   Please see dictated history and physical for further patient presentation  and profile.   LABORATORY DATA:  Cardiac markers were negative.  BUN and creatinine were  normal.  CBC was normal.   Twelve-lead electrocardiogram showed anterior T wave changes.   Chest x-ray was unremarkable.   HOSPITAL COURSE:  The patient was admitted electively.  She was placed on IV  nitroglycerin and IV heparin.  We proceeded on with catheterization the  following day; that procedure was tolerated well without any known  complications; the results are as noted above.  She will continue with  medical management.  Post procedure, she was transferred back to 4700 and  once her bedrest was complete and she was able to be up and ambulatory  without problem, she was felt to be a satisfactory candidate for discharge.   DISCHARGE CONDITION:  Stable.  DISCHARGE MEDICINES:  She will resume all of her previous medicines which  include:  1. Lipitor 10 mg daily.  2. Lexapro 10 mg a day.  3. Toprol-XL 50 mg daily .  4. Plavix 75 mg daily.  5. Zetia 10 mg daily.  6. Imdur 60 mg daily.  7. Aciphex daily.   She is not to restart her Fortamet until after blood work is drawn in our  office on Thursday, Jun 18, 2004.   FOLLOWUP:  We will see her back in the office in 1-2 weeks, certainly sooner  if problems arise.   WOUND CARE:  Groin management was given per written instructions and she is  to call for any problems in the interim.      LC/MEDQ  D:  06/17/2004  T:  06/17/2004  Job:  [id number]   cc:   Veverly Fells. Altheimer, M.D.  [address], [address]  [place]  [place] [address]  Fax: [phone]

## 2010-06-19 NOTE — H&P (Signed)
NAME:  Christine Blanchard, Christine Blanchard                         ACCOUNT NO.:  [id number]   MEDICAL RECORD NO.:  [id number]                   PATIENT TYPE:  OIB   LOCATION:                                       FACILITY:  [facility]   PHYSICIAN:  Colleen Can. Deborah Chalk, M.D.            DATE OF BIRTH:  02/24/1928   DATE OF ADMISSION:  03/01/2003  DATE OF DISCHARGE:                                HISTORY & PHYSICAL   CHIEF COMPLAINT:  Recurrent angina.   HISTORY OF PRESENT ILLNESS:  Christine Blanchard is a 77 year old white female who has  known coronary disease.  She had previous coronary artery bypass grafting  (ALF pump) in 4/04.  She had repeat cardiac catheterization due to  recurrence of the again six weeks ago and had stent placement to the  intermediate.  She continues to have ongoing bouts of chest pain that are  responsive to nitroglycerin.  She is now admitted for a repeat cardiac  catheterization.   PAST MEDICAL HISTORY:  1. Atherosclerotic cardiovascular disease with subsequent coronary artery     bypass grafting (ALF pump), 4/04, with left internal mammary to the LAD     with vein graft to the first diagonal.  Last catheterization was in 12/04     with stent placement to the intermediate (2.5 x 23-mm Cypher stent).  2. Hypertension.  3. Diabetes.  4. Hyperlipidemia.  5. Chronic anxiety.  6. Osteoarthritis.  7. Macular degeneration.  8. Stress incontinence.  9. History of cervical surgery in 7/02.   ALLERGIES:  CODEINE and ACTOS.   CURRENT MEDICINES:  1. Plavix 75 mg a day.  2. Imdur 60 mg a day.  3. Aciphex p.r.n..  4. Metformin 1000 mg a day.  5. Zetia 10 mg a day.  6. Altace 5 a day.  7. Aspirin daily.  8. Toprol XL 25 mg 1/2 tablet a day.  9. Remeron 15 mg daily.  10.      Indapamide 1.25 daily.  11.      Lipitor 10 a day.  12.      Lexapro 10 a day.   FAMILY HISTORY:  Unchanged.   SOCIAL HISTORY:  Unchanged.   REVIEW OF SYSTEMS:  As noted above and otherwise unremarkable.   PHYSICAL EXAMINATION:  GENERAL:  She is an anxious white female in no acute  distress.  VITAL SIGNS:  Blood pressure 110/66 sitting, 110/70 standing, heart rate 68  and regular, respirations 18.  She is afebrile.  SKIN:  Warm and dry.  Color is unremarkable.  LUNGS:  Basically clear.  HEART:  Regular rhythm.  ABDOMEN:  Nontender with positive bowel sounds and without masses.  EXTREMITIES:  Without edema.  NEUROLOGIC:  Intact.  There are no gross focal deficits.   PERTINENT LABORATORY DATA:  Pending.   OVERALL IMPRESSION:  1. Recurrent episodes of angina in the setting of known coronary disease  with previous stent placement six weeks go to the intermediate coronary     artery and previous history of bypass grafting (ALF pump) in 4/04.  2. Diabetes  3. Hypertension.  4. Hyperlipidemia   PLAN:  Will proceed on with repeat cardiac catheterization.  The procedure  has been reviewed in full detail, and she is willing to proceed on 03/01/03.      Juanell Fairly C. Earl Gala, N.P.                 Colleen Can. Deborah Chalk, M.D.    LCO/MEDQ  D:  02/18/2003  T:  02/18/2003  Job:  [id number]   cc:   Veverly Fells. Altheimer, M.D.  [address], [address]  [place]  [place] [address]  Fax: [phone]

## 2010-06-19 NOTE — Cardiovascular Report (Signed)
NAMEMAKAYLEE, SPIELBERG                ACCOUNT NO.:  [id number]   MEDICAL RECORD NO.:  [id number]          PATIENT TYPE:  OIB   LOCATION:  6527                         FACILITY:  [facility]   PHYSICIAN:  Colleen Can. Deborah Chalk, M.D.DATE OF BIRTH:  Apr 12, 1928   DATE OF PROCEDURE:  DATE OF DISCHARGE:  01/09/2004                              CARDIAC CATHETERIZATION   PROCEDURE:  Left heart catheterization with selective coronary angiography,  left ventricular angiography, stent placement in the left anterior  descending with angioplasty of the second diagonal vessel.   TYPE AND SITE OF ENTRY:  Percutaneous, right femoral artery with Angio-Seal.   CATHETERS:  6 French 4 curved Judkins right and left coronary catheters; 6  French pigtail ventriculographic catheter; internal mammary graft catheter;  left coronary bypass graft catheter.  For the intervention, we used an Engineer, manufacturing systems, 6 [place] FL-4 guide, initially a 2.0 x 20-mm  Quantum Maverick balloon, then a 2.5 x 23-mm Cypher, and subsequently a 3.0  x 8-mm Quantum Maverick balloon for post dilation.  On the first diagonal  vessel, we used a 2.0 x 20 Quantum Maverick, 2.5 x 10-mm cutting balloon and  then returned after the stent was placed for a 2.0 x 20-mm Maverick balloon  dilating it through the stent.  We did use a double-wire technique with two  Clinical cytogeneticist wires.   MEDICATIONS GIVEN DURING THE PROCEDURE:  Heparin, integrilin.   MEDICATIONS GIVEN PRIOR TO PROCEDURE:  Valium 5 mg p.o.   HEMODYNAMIC DATA:  1.  The aortic pressure was 135/62.  2.  LV was 125/0/16.  3.  There was no aortic valve gradient noted on pullback.   ANGIOGRAPHIC DATA:  1.  The left main coronary artery is normal.  2.  Left anterior descending:  Left anterior descending has a segmental      stenosis at the level of the first septal perforating branch and largest      diagonal vessel.  This large diagonal vessel bifurcates and is grafted   distally.  There is a severe stenosis at the ostium of the diagonal and      on a continuation branch that is not bypassed.  There is at least a      moderately severe narrowing.  The continuation of the left anterior      descending at this point in time is associated with at least a 70-80%      stenosis at the bifurcation of the diagonal as well as a 60-70% stenosis      beyond the septal perforating branch.  3.  Left circumflex:  Left circumflex is reasonably large vessel with      irregularities.  There is a posterior lateral branch without severe      obstructive disease, but there are moderate irregularities present.  4.  Intermediate:  There is a moderate size intermediate vessel that is      stented proximally.  It is widely patent with excellent distal flow.  5.      Right coronary artery:  The right coronary artery is a dominant  vessel.      It has irregularities with 20-30% narrowing proximally and a 20-30%      narrowing near the acute margin.  5.  Saphenous vein graft to the diagonal vessel is patent.  There is a large      valve in this graft.  The graft itself is somewhat oversized for the      vessel, but has good flow and good distal runoff.  6.  Left internal mammary graft to the LAD is patent proximally, but is      atretic distally.  Although is a flush injection, we really could not      see opacification of the vessels in a meaningful fashion distally.  It      was a satisfactory opacification.   Left ventricular angiogram was performed in  RAO projection.  Overall  cardiac size and silhouette are normal.  Global ejection fraction is 60%.  Regional wall motion is normal.   ANGIOPLASTY PROCEDURE:  We used a JL-4 guide throughout the case. Double  guide wire techniques were used with two Asahi Prowater wires passed into  the diagonal and into the continuation of the left anterior descending.  Initially, we dilated the diagonal vessel with 2.0 Maverick and then  dilated  the left anterior descending.  We returned after that with the 2.5 x 10-mm  cutting balloon into the diagonal vessel.  This was inflated to a maximum of  9 atmospheres.  We then returned with a 2.5 x 23-mm Cypher stent and  inflated it to a maximum of 10 atmospheres.  We then used a 3.0 x 8-mm  Quantum Maverick to predilate the proximal portion of this stent to ensure  adequate seating of the proximal left anterior descending which was much  larger than the distal left anterior descending.  We were able to maintain  the adequate flow in the diagonal vessel. We then went back through the  stent and dilated the stent struts with a 2.0 x 20-mm Quantum Maverick  balloon and the final angiographic result was felt to be excellent.   OVERALL IMPRESSION:  1.  Severe stenosis in the left anterior descending and diagonal system with      successful angioplasty with stent placed in the left anterior      descending.  2.  Atretic left internal mammary artery to the distal left anterior      descending.  3.  Mild coronary atherosclerosis otherwise.   DISCUSSION:  It is felt that this would address potential ischemic areas and  overall is felt to be a very satisfactory result.  It would be possible to  consider angioplasty of the diagonal vessel if needed, but that diagonal  does have satisfactory vein graft distally.       SNT/MEDQ  D:  01/10/2004  T:  01/11/2004  Job:  [id number]   cc:   Veverly Fells. Altheimer, M.D.  [address]  [place]  [place] [address]  Fax: [phone]

## 2010-06-19 NOTE — Op Note (Signed)
[facility]  Patient:    Christine Blanchard                        MRN: [id number] Proc. Date: 03/09/99 Adm. Date:  [id number] Attending:  Katha Cabal CC:         Veverly Fells. Altheimer, M.D.                           Operative Report  INDICATIONS:                  Christine Blanchard is a 77 year old lady who on recent mammogram and ultrasound was found to have some new microcalcifications in the upper outer quadrant of the left breast.  These were felt to have increased and it was felt that they should be removed.  SURGEON:                      Thornton Park. Daphine Deutscher, M.D.  ANESTHESIA:                   LMA.  DESCRIPTION OF PROCEDURE:     After [name], M.D. had performed his hysteroscopy, I prepped the left breast with Betadine and draped sterilely after trimming the wire that had been previously inserted in radiology, back to about two inches in length.  A curvilinear incision was made in the left upper outer quadrant after prepping and draping with Betadine.  I carried this down into the subcutaneous tissue.  I fixed the needle with a figure-of-eight suture of 4-0 Vicryl.  I used scissors to cut a cyllindrical biopsy trying to follow along the wire along its trajectory.  It went very deep, probably at least 9 cm deep.  I followed this down and tried to resect the tip and did so.  As I encountered some nodularity in the very depths of the breast, I tried to get around that.  This as a very fatty breast and some margins may not be as accurate as the actual surgical margins, but I got around and got the tip out.  This was sent for specimen mammogram.  While waiting, there was some breast tissue anterior to the wire, which I resected and sent for permanent sections.  The wound was irrigated.  There was some bleeding deep that was presented when her pressure came back up and this was then identified and controlled with a figure-of-eight  suture of 4-0 Vicryl. The wound was then irrigated with some lidocaine as well as some saline.  It was closed with 4-0 Vicryl subcutaneously and subcuticularly with Benzoin and Steri-Strips on the skin.  The patient will be given Tylox to take for pain and will be followed up in the office in two weeks. DD:  03/09/99 TD:  03/09/99 Job: [id number]

## 2010-06-19 NOTE — H&P (Signed)
[facility]  Patient:    Christine Blanchard, Christine Blanchard                         MRN: [id number] Adm. Date:  08/25/00 Attending:  Payton Doughty, M.D.                         History and Physical  ADMITTING DIAGNOSIS:  Herniated disk C6-7 on the right.  SERVICE:  Neurosurgery.  HISTORY OF PRESENT ILLNESS:  This is a 77 year old right-handed white lady who sneezed last Thursday, had the onset of neck and shoulder pain that has worsened over the past few days.  MRI shows a disk 6-7 to the right, was referred to me.  Left side is not affected.  She does not have a Lhermittes.  MEDICAL HISTORY:  Remarkable for hypertension and essential tremors.  MEDICATIONS: 1. Monopril 20 mg a day. 2. Prempro 0.625/2.5 mg a day. 3. Levsinex 0.375 mg a day. 4. Remeron 15 mg a day for essential tremor.  She uses Celebrex and Ultram on a p.r.n. basis.  PAST SURGICAL HISTORY:  She has had a cholecystectomy and appendectomy in the past.  ALLERGIES:  She is sensitive CODEINE drug products insofar as they cause her nausea.  FAMILY HISTORY:  Noncontributory.  REVIEW OF SYSTEMS:  Remarkable for neck pain, shoulder and arm pain.  SOCIAL HISTORY:  She does not smoke and does not drink.  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  PHYSICAL EXAMINATION:  HEENT:  Within normal limits.  NECK:  She has limited range of motion of her neck.  Reproduces her right shoulder and arm pain with turning her head toward the right.  Lying down and extending her neck also reproduces it.  CHEST:  Occasional wheeze.  CARDIAC:  Regular rate and rhythm with a 1/6 holosystolic murmur.  ABDOMEN:  Nontender, no hepatosplenomegaly.  EXTREMITIES:  Without clubbing or cyanosis.  GENITOURINARY:  Deferred.  PERIPHERAL PULSES:  Good.  NEUROLOGIC:  She is awake, alert, and oriented.  Her cranial nerves are intact.  Motor exam shows 5/5 strength throughout the upper extremities  save for the right triceps which is 4/5 at best.  She does not describe a sensory deficit.  There is tingling in her right C7 distribution.  Reflexes are 2 at the biceps, absent at the right triceps, 2 at the left, 1 at the brachioradialis bilaterally.  Lower extremity reflexes are nonpathologic.  LABORATORY DATA:  She comes accompanied with an MRI that demonstrates a disk at 6-7 eccentric to the right with compression right C7 neural foramen.  CLINICAL IMPRESSION:  Herniated disk C6-C7 with a right C7 radiculopathy.  PLAN:  Anterior cervical diskectomy and fusion.  We discussed epidural steroids but she wanted to proceed with the diskectomy; I think it is perfectly reasonable to do.  I told her she could get on the plane to [place] in a week.DD:  08/25/00 TD:  08/26/00 Job: [id number]

## 2010-06-19 NOTE — Discharge Summary (Signed)
NAME:  Christine Blanchard, Christine Blanchard                          ACCOUNT NO.:  [id number]   MEDICAL RECORD NO.:  [id number]                   PATIENT TYPE:  INP   LOCATION:  3001                                 FACILITY:  [facility]   PHYSICIAN:  Pramod P. Pearlean Brownie, MD                 DATE OF BIRTH:  04/09/1928   DATE OF ADMISSION:  03/02/2003  DATE OF DISCHARGE:  03/05/2003                                 DISCHARGE SUMMARY   ADMISSION DIAGNOSIS:  Stroke.   DISCHARGE DIAGNOSES:  1. Left frontal subcortical white matter infarction from arterial     embolization from cardiac catheterization.  2. Coronary artery disease.  3. Diabetes.  4. Hypertension.  5. Hyperlipidemia.   HISTORY OF PRESENT ILLNESS:  Kindly see Dr. Anne Hahn' excellent history and  physical dated March 02, 2003, for details. The patient is a 77 year old  right-handed lady who underwent right upper extremity clumsiness and  paresthesias following diagnostic cardiac catheterization on March 01, 2003. The patient did not notice her symptoms immediately after the  procedure, but an hour or so later, she noticed the right leg was heavy and  she had some difficulty  using her right hand. Her symptoms persisted and  she came to the hospital the next day. A noncontrast CT scan of the head was  performed which did not show any acute findings. She was admitted to the  stroke service for risk stratification workup.   HOSPITAL COURSE:  An MRI scan of the brain was subsequently obtained on  March 03, 2003, which showed a small area of acute infarction in the left  posterior frontal cortex and subcortical white matter. Age appropriate  atrophy and small vessel changes were also seen. An MRA of the intracranial  circulation revealed a focal branch occlusion of the left middle cerebral  artery in the posterior frontal cortex, which would explain her infarction.  There were mild, diffuse intracranial atherosclerotic changes involving  distal portions  of both middle cerebral and posterior cerebral arteries.  There was mild focal stenosis of the left internal __________ artery and the  siphon segment. An MRA of the neck revealed no significant carotid or  vertebral basilar disease.   The patient was continued on Plavix for secondary stroke prevention.  Telemetry monitoring did not reveal any cardiac arrhythmias. The laboratory  studies  revealed unremarkable blood chemistries except for minimally  elevated glucose of 210. The WBC count was unremarkable. Liver enzymes were  normal. Homocysteine was normal at 12.60.   The patient was seen in consultation by occupational therapy and physical  therapy and was felt safe to be discharged on March 05, 2003, with  arrangements from home health physical therapy and occupational therapy. She  was also seen in consultation  by cardiology, Dr. Deborah Chalk.   On the day of discharge the patient had minimum weakness of the right hand  intrinsic muscles. She was  able to bend her fingers, but had a weak grip.  She was asked to follow up with her primary care physician, Dr. Leslie Dales,  as needed and Dr. Deborah Chalk as needed. She will followup with Dr. Pearlean Brownie in the  office in 2 months.   DISCHARGE MEDICATIONS:  1. Plavix 75 mg every day.  2. Zetia 10 mg every day.  3. Zocor 20 mg every day.  4. Toprol XL 25 mg every day.  5. __________ 5 mg every day.  6. Lexapro 10 mg every day.  7. Indapamide 1.25 mg daily.  8. Hyoscyamine 0.375 mg daily.  9. Imdur 60 mg b.i.d.                                                Pramod P. Pearlean Brownie, MD    PPS/MEDQ  D:  03/05/2003  T:  03/05/2003  Job:  [id number]   cc:   Veverly Fells. Altheimer, M.D.  [address], [address]  [place]  [place] [address]  Fax: [phone]   Colleen Can. Deborah Chalk, M.D.  Fax: [phone]

## 2010-06-19 NOTE — H&P (Signed)
NAMEAUBRY, Christine Blanchard                          ACCOUNT NO.:  [id number]   MEDICAL RECORD NO.:  [id number]                   PATIENT TYPE:  INP   LOCATION:  1825                                 FACILITY:  [facility]   PHYSICIAN:  Colleen Can. Deborah Chalk, M.D.            DATE OF BIRTH:  1928-10-21   DATE OF ADMISSION:  DATE OF DISCHARGE:                                HISTORY & PHYSICAL   CHIEF COMPLAINT:  Recurrent angina.   HISTORY OF PRESENT ILLNESS:  The patient is a 77 year old white female who  has had recently documented significant LAD disease. She underwent cardiac  catheterization due to episodes of intermittent chest discomfort, spells of  weakness as well as shortness of breath. She underwent coronary angiography  on May 02, 2002. At that time angiography demonstrated normal right heart  pressures, normal left ventricular function, moderately severe  atherosclerosis, predominantly in the LAD system with segmental 70% to 80%  narrowing to the LAD, first septal perforator branch with a 50% to 60%  narrowings at the bifurcation of the LAD and a large first diagonal system,  and a 70+ percent narrowing of the bifurcation branch of this diagonal  system with a 70% to 80% stenosis and a small intermediate coronary. There  was mild to moderate coronary disease in the right coronary as well as the  circumflex.   At that point in time there were initial attempts to manage the patient  medically. She was placed on beta blocker therapy as well as Plavix. She has  been seen in the office on May 15, 2002, and has had recurrent bouts of  angina. Her medicines have been increased, however, the patient has  continued to have significant episodes of angina with just minimal activity,  i.e., even combing her hair.   She presented to the office on May 21, 2002, with multiple episodes of  angina, basically occurring  with just minimal activity. She has had no  symptoms at rest. She did wake  up this morning with shortness of breath. She  took 5 nitroglycerin this past Saturday, 3 on Sunday. In light of  her  ongoing symptoms and failure on medical therapy, she is now referred for  elective admission. She has had her cine angiograms taken to [facility] for consideration for coronary artery bypass surgery.   PAST MEDICAL HISTORY:  1. Atherosclerotic coronary artery disease with recent cardiac     catheterization with results as described above.  2. Hypertension.  3. Diabetes since August 2003.  4. Dyslipidemia, currently on Lipitor and Zetia.  5. Hypertensive heart disease.  6. History of menopause.  7. Stress incontinence with a history of cystocele and rectocele.  8. History of diverticulosis.  9. Macular degeneration.  10.      History of anxiety.  11.      Mild depression.  12.      History of  benign essential tremor.  13.      History of osteoarthritis.  14.      Previous surgery for ruptured cervical disk in July 2002.   ALLERGIES:  ACTOS CAUSES SHORTNESS OF BREATH AND WEAKNESS AS WELL AS  INTOLERANCE TO CODEINE.   CURRENT MEDICATIONS:  1. Lexapro 10 mg a day.  2. Lipitor 10 mg a day.  3. Monopril 20 mg a day.  4. Levsinex 0.375 daily.  5. Remeron 15 mg 1/2 tablet daily.  6. Depamide 1.25 daily.  7. Estrace cream three times a week.  8. Imdur 60 mg a day.  9. Toprol  XL 50 mg a day.  10.      Plavix 75 mg a day.  11.      Aspirin daily.  12.      She was most recently was added Zetia 10 mg.   FAMILY HISTORY:  Unchanged.   SOCIAL HISTORY:  Unchanged.   REVIEW OF SYSTEMS:  As noted above and is otherwise unremarkable.   PHYSICAL EXAMINATION:  GENERAL:  She is very pleasant, somewhat anxious.  VITAL SIGNS:  Blood pressure 130/60 sitting, 120/80 standing, heart rate 76  and regular, respirations 18, afebrile.  SKIN:  Warm and dry. Color is unremarkable.  NECK:  Supple, no JVD.  LUNGS:  Basically clear.  CARDIAC:  Regular rhythm without  murmur.  ABDOMEN:  Soft, positive bowel sounds, nontender with no organomegaly.  EXTREMITIES:  Without edema yet somewhat fleshy.  NEUROLOGIC:  Intact, no gross focal deficits.   LABORATORY DATA:  Pending.   IMPRESSION:  1. Multiple bouts of unstable angina in a setting  of known coronary artery     disease with failure of outpatient medical therapy.  2. Hypertension.  3. Hyperlipidemia.  4. Postmenopausal.  5. Depression/anxiety.   PLAN:  Will proceed on with admission to the hospital to try to stabilize  the patient medically. We will need to make a referral to [facility]  for her to undergo coronary artery bypass grafting.     Juanell Fairly C. Earl Gala, N.P.                 Colleen Can. Deborah Chalk, M.D.    LCO/MEDQ  D:  05/21/2002  T:  05/21/2002  Job:  [id number]   cc:   Veverly Fells. Altheimer, M.D.  [address], [address]  [place]  [place] [address]  Fax: [phone]   Dr. Bo Merino [facility]   Strawberry. Deborah Chalk, M.D.  [address], [address]  [place]  [place] [address]  Fax: [phone]

## 2010-06-19 NOTE — H&P (Signed)
NAMEJONNE, ROTE                ACCOUNT NO.:  [id number]   MEDICAL RECORD NO.:  [id number]          PATIENT TYPE:  OIB   LOCATION:                               FACILITY:  [facility]   PHYSICIAN:  Colleen Can. Deborah Chalk, M.D.DATE OF BIRTH:  1928/08/29   DATE OF ADMISSION:  01/08/2004  DATE OF DISCHARGE:                                HISTORY & PHYSICAL   CHIEF COMPLAINT:  Shortness of breath and recurrent angina.   HISTORY OF PRESENT ILLNESS:  Mrs. Cowher is a 77 year old white female who has  multiple medical problems. She presents for repeat elective cardiac  catheterization.  She has a long history of ischemic heart disease with  previous coronary artery bypass grafting performed at [facility].  She has had residual angina and has had stent placement  thereafter.  She had a followup catheterization due to recurrent chest pain  in January 2005 and unfortunately suffered a cerebrovascular accident at  that point in time.  She has basically done well from the stroke standpoint  and has recovered quite nicely.  Unfortunately, she continues to have  episodes of angina and shortness of breath.  Her discomfort is primarily  exertional in nature.  On Thanksgiving, she had more shortness of breath and  was concerned for the possibility of congestive heart failure. She has had  repeat 2-D echocardiogram as well as lab work which showed a normal BNP.   Unfortunately, she suffered a fall approximately one week ago when she was  getting up to go to the bathroom and fell and hit her head.  It is unsure if  she actually suffered a syncopal episode; however, she has had a CT scan  which was basically unremarkable.  She has also had recent Cardiolite study  performed on December 06, 2003.  This showed an ejection fraction of 81%.  The EKG was abnormal, but there was no frank ischemia noted with the  Cardiolite imaging.  In light of her ongoing symptomatology, she is now  referred for repeat  catheterization.   PAST MEDICAL HISTORY:  1.  Atherosclerotic cardiovascular disease with previous coronary artery      bypass grafting off pump April 2004 at [facility].  At      that time, she had left internal mammary artery to the LAD, vein graft      to the diagonal.  She underwent catheterization in December 2004 and had      a stent placed to the intermediate.  She had followup catheterization in      January 2005 which showed patent left internal mammary artery to the      LAD, patent saphenous vein graft to the diagonal, but with somewhat slow      flow in the vein graft with distal tapering in the LAD artery graft but      felt overall to probably be satisfactory.  There was moderate disease of      the right coronary and left circumflex systems.  There was severe      obstructive disease in the LAD  and does appear to be satisfactorily      bypassed.  The stent in the intermediate system is patent.  At that      point in time, her symptoms were felt to be out of proportion to      objective findings, and she would continue with medical therapy.  2.  Hypertension.  3.  Diabetes.  4.  Hyperlipidemia.  5.  Chronic anxiety.  6.  Osteoarthritis.  7.  Macular degeneration.  8.  Stress incontinence.  9.  Cervical surgery July 2002.  10. Recent fall with questionable syncope with negative CT scan.  11. History of stroke following catheterization in January 2005.   ALLERGIES:  1.  CODEINE.  2.  ACTOS.  3.  SULFA.  4.  __________.  5.  INDERAL LA.  6.  ATENOLOL.  7.  VICODIN.   CURRENT MEDICATIONS:  1.  Lasix 40 mg a day.  2.  Fortamet 100 mg a day.  3.  Nitroglycerin p.r.n.  4.  Aciphex 20 mg a day.  5.  Imdur 60 b.i.d.  6.  Zetia 10 mg a day.  7.  Altace 10 mg a day.  8.  Plavix 75 mg a day.  9.  Toprol XL 100 mg a day.  10. Lexapro 10 mg a day.  11. Lipitor 10 daily.  12. Norvasc 5 daily.   FAMILY HISTORY:  Unchanged per the prior record.   SOCIAL  HISTORY:  She is a widow.  She does live at home alone.  She has no  current tobacco use.   REVIEW OF SYSTEMS:  Basically as noted above.  Her chest pain syndrome is  exertional in nature and basically occurs between her shoulder blades.  She  has had progressive shortness of breath as well as lower extremity edema.  Since her fall approximately 7 to 10 days ago, she has not had recurrence of  that.   PHYSICAL EXAMINATION:  VITAL SIGNS:  Weight 152 pounds.  Blood pressure  130/70 sitting and standing, heart rate 68.  HEENT:  Unremarkable; however, there is a left upper outer quadrant visual  field defect, questionable old versus new.  NECK:  Supple.  There is no JVD.  LUNGS:  Reasonably clear.  HEART:  Regular rate and rhythm.  There is no murmur and no gallop.  ABDOMEN:  Soft.  Positive bowel sounds, nontender.  EXTREMITIES:  Without edema.  NEUROLOGIC:  No gross focal deficit.  Romberg in negative.   LABORATORY AND X-RAY DATA:  Pertinent labs are pending.   OVERALL IMPRESSION:  1.  Recurrent chest pain with need for followup catheterization.  2.  Know history of ischemic heart disease with previous bypass grafting      2004 and subsequent stent placement to the intermediate in 2004.  3.  Stroke occurring following catheterization January 2005.  4.  Anxiety and depression.  5.  Hypertension.  6.  Hyperlipidemia.  7.  Diabetes.   PLAN:  Will proceed on with repeat catheterization.  The procedure has been  reviewed in full detail with both her and her daughter, [name], including the  risks and benefits.  She is willing to proceed on.       ________________________________________  Sharlee Blew, N.P.  ___________________________________________  Colleen Can. Deborah Chalk, M.D.    LC/MEDQ  D:  01/06/2004  T:  01/06/2004  Job:  [id number]   cc:   Veverly Fells. Altheimer, M.D.  [address]  [place] [address]  Fax: [phone]   Pramod P. Pearlean Brownie, MD  Fax:  [phone]

## 2010-06-19 NOTE — H&P (Signed)
NAMESTEFAN, Blanchard                ACCOUNT NO.:  [id number]   MEDICAL RECORD NO.:  [id number]          PATIENT TYPE:  INP   LOCATION:  1823                         FACILITY:  [facility]   PHYSICIAN:  Colleen Can. Deborah Chalk, M.D.DATE OF BIRTH:  01-03-1929   DATE OF ADMISSION:  06/15/2004  DATE OF DISCHARGE:                                HISTORY & PHYSICAL   CHIEF COMPLAINT:  Chest pain.   HISTORY OF PRESENT ILLNESS:  [name] is a 77 year old female who has  multiple medical problems.  She has a known history of extensive  atherosclerotic cardiovascular disease and has had previous coronary artery  bypass grafting that dates back to 2004.  She has had a repeat  catheterization since that time with subsequent stent placement to the  intermediate, as well as to the left anterior descending/diagonal.  She has  also had a previous catheterization that resulted in a stroke in January  2005.  She presents to the  emergency room this morning after being awakened  at approximately 5 or 5:30 a.m. with pain between her shoulder blades that  is basically her angina equivalent.  She took nitroglycerin x3 approximately  10 minutes apart with really no relief.  She was able to subsequently get  up, and she took her morning dose of Imdur, with some improvement, but  certainly not a complete resolution.  She really had no other associated  symptoms.  She was subsequently seen and evaluated in the emergency room and  is admitted now for further evaluation.  She is currently pain-free.   PAST MEDICAL HISTORY:  1. Atherosclerotic cardiovascular disease.  She had previous off-pump      coronary artery bypass graft surgery in April 2004, at [facility] with the left internal mammary to the LAD and a vein graft to      the diagonal.  She had a previous catheterization in December 2004,      with stent placement to the intermediate.  She had a catheterization in      January 2005, which did  result in a post-procedural stroke.  She      underwent a catheterization again in December 2005, and subsequently      had stent placement to a bifurcating LAD lesion, along with an      angioplasty.  2. Longstanding diabetes mellitus.  3. Hypertension, but currently exhibiting orthostatic hypotension.  4. Hyperlipidemia.  5. Chronic anxiety.  6. Osteoarthritis.  7. Macular degeneration.  8. History of stress incontinence.  9. Previous history of fall with a negative CT scan in December 2005.     ALLERGIES:  CODEINE, ACTOS, SULFA, INDERAL, ATENOLOL AND VICODIN.   CURRENT MEDICATIONS:  1. Lipitor 10 mg daily.  2. Lexapro 10 mg daily.  3. Toprol XL 50 mg daily.  4. Plavix 75 mg daily.  5. Zetia 10 mg daily.  6. Imdur 60 mg daily.  7. Aciphex daily.  8. Fortamet 1000 mg daily.  9. __________ injections as needed.     NOTATION:  She has recently had her  Lisinopril dose discontinued, as well as  her Lasix due to orthostatic hypotension.   FAMILY HISTORY:  Father died at age 76, with heart disease and hypertension.  Mother died at age 74, with hypertension and colon cancer.   SOCIAL HISTORY:  She is a retired Lawyer.  She has had no tobacco  use and has rare alcohol use.   PAST SURGICAL HISTORY:  1. Cholecystectomy in 1980.  2. Anterior cervical diskectomy and fusion in 2003.  3. Lumpectomy on the left breast in 2002.  4. Childbirth x2.     REVIEW OF SYSTEMS:  Is basically as noted above.  She has had significant  problems with chronic dizziness with associated orthostatic hypotension  despite medication readjustments.  She continued to have chest pain syndrome  which is for her pain between her shoulder blades.  She has had no  recurrence of frank syncope.   PHYSICAL EXAMINATION:  GENERAL:  She is currently in no acute distress.  She  is somewhat anxious.  VITAL SIGNS:  Blood pressure 137/70, heart rate in the 60's, respirations  18, afebrile.  SKIN:   Warm and dry.  Color is unremarkable.  LUNGS:  Basically clear.  HEART:  A regular rhythm.  BREASTS:  The breasts are quite pendulous.  ABDOMEN:  Soft, positive bowel sounds.  EXTREMITIES:  Without edema.  Support stockings are in place bilaterally.  NEUROLOGIC:  Intact.  No gross focal deficits.   LABORATORY DATA:  Cardiac markers are negative.  BUN and creatinine are  normal.  CBC is within normal limits.   Electrocardiogram does show some anterior T-wave changes which is unchanged  from prior tracings.   A chest x-ray is pending.   OVERALL IMPRESSION:  1. Episode of recurrent chest pain.  2. Extensive history of atherosclerotic cardiovascular disease with      previous history of coronary artery bypass grafting in 2004, subsequent      repeat catheterizations x3, two of which involved further attempts at      revascularization with subsequent stent placement to the intermediate      and subsequent stent and angioplasty to a bifurcating left anterior      descending coronary artery lesion.  3. Post-procedural stroke.  4. Diabetes mellitus.  5. Orthostatic hypotension.  6. Chronic anxiety.  7. Hyperlipidemia.     PLAN:  We will proceed on with admission to the hospital.  She will be  placed on IV nitroglycerin and IV heparin.  She is currently pain-free at  the present time.  Will check cardiac enzymes and will proceed on with a  cardiac catheterization tomorrow afternoon.  The procedure has been reviewed  in full detail, including the risks and benefits, and she is willing to  proceed on.      LC/MEDQ  D:  06/15/2004  T:  06/15/2004  Job:  [id number]   cc:   Veverly Fells. Altheimer, M.D.  [address], [address]  [place]  [place] [address]  Fax: [phone]

## 2010-06-19 NOTE — Cardiovascular Report (Signed)
NAMEGERALDY, Christine Blanchard                          ACCOUNT NO.:  [id number]   MEDICAL RECORD NO.:  [id number]                   PATIENT TYPE:  OIB   LOCATION:  2888                                 FACILITY:  [facility]   PHYSICIAN:  Colleen Can. Deborah Chalk, M.D.            DATE OF BIRTH:  [birth year]   DATE OF PROCEDURE:  03/01/2003  DATE OF DISCHARGE:  03/01/2003                              CARDIAC CATHETERIZATION   INDICATIONS FOR PROCEDURE:  Christine Blanchard has had  previous coronary artery  bypass grafting in April of 2004. She had followup catheterization leading  to a stent in the intermediate coronary artery in December 2004. She has had  recurrent angina and now returns for followup catheterization.   PROCEDURE:  Left heart catheterization with selective coronary angiography,  left ventriculography, saphenous vein graft angiography  and angiography  of  the left internal mammary artery.  Type and site of entry:  Percutaneous right femoral artery with Perclose  curved catheter, 6 French 4 curved Judkins right and left coronary  catheters, 6 French pigtail catheter ventriculographic catheter, left  internal mammary graft catheter. Contrast material Omnipaque.   MEDICATIONS:  Medications given prior to the procedure, Valium 10 mg p.o. No  medications given during the procedure, but with Ancef 1 gm after  the  procedure.   COMMENTS:  The patient tolerated the procedure well.   HEMODYNAMIC DATA:  The aortic pressure was 118/63, LV 131/8 to 20. There was  no aortic valve gradient noted on pullback.   ANGIOGRAPHIC DATA:  1. Left main coronary artery is normal.  2. Left circumflex:  The left circumflex continued primarily as a     posterolateral branch. There was 50% to 60% narrowing in the distal  left     circumflex prior to the bifurcation. This does not  appear to be flow     obstructive in nature. The distal vessels are satisfactory.  3. Left anterior descending coronary artery:  The left  anterior descending     artery had complex plaque involving the 1st septal perforating branch,     the continuation of the left anterior descending coronary artery and  the     diagonal system. The diagonal vessel had severe  ostial stenosis. There     was severe stenosis in the left anterior descending coronary artery after     the 1st septal perforating branch. Bidirectional flow was present in both     vessels distally.  4. The intermediate coronary was widely patent with a stent with good flow     and no severe residual disease.  5. Right coronary artery:  The right coronary artery is a dominant vessel.     It has irregularities in its midportion no greater than 30% to 40% in the     more proximal segment and  at the  acute margin. The distal vessel is     free of  significant disease.  6. The saphenous vein graft to the diagonal vessel is widely patent. The     vein is dilated and there is a valve in the vein. There is satisfactory     flow, however, but it is somewhat sluggish. The diagonal vessel has a     tent where the vein graft is sewn in. There appears to be satisfactory     retrograde filling beyond the bifurcation of this vessel. The other  limb     of the bifurcation is felt to be probably be satisfactory.  7. The left internal mammary artery graft  to the LAD is patent and has     satisfactory flow. There is a question as to the small diameter of this     vessel, especially distally as it inserts into the LAD, but there does     appear to be satisfactory flow.  8. Left ventricular angiogram was performed in the RAO projection. Overall     cardiac  size and silhouette were normal. Left ventricular ejection     fraction is 55% to 60%. Regional wall motion is normal.   IMPRESSION:  1. Patent left internal mammary graft  to the left anterior descending     artery and patent saphenous vein graft to the diagonal vessel, but with     somewhat slow flow in the vein graft and  distal tapering in the left     anterior descending artery graft, but felt to be probably satisfactory.  2. Moderate atherosclerosis in the right coronary artery and left circumflex     systems.  3. Severe obstructive disease in the left anterior descending coronary     artery in what would appear to be satisfactory bypasses.  4. Persistent patency of the stent in the intermediate system.   DISCUSSION:  The symptoms are out of proportion to the objective findings.  The left internal mammary graft to the distal LAD is small, but appears to  be appropriate in size. There is severe stenosis in the more proximal LAD,  and that really is the only location that is severe  enough  to really  account for this type of symptoms that she is experiencing at a reasonably  low level of exercise. The bypassed diagonal vessel is probably  satisfactory. We will continue to try to manage her medically at this point  in time with consideration for percutaneous intervention of objective  testing would warrant.                                               Colleen Can. Deborah Chalk, M.D.    SNT/MEDQ  D:  03/01/2003  T:  03/03/2003  Job:  [id number]

## 2010-06-19 NOTE — Op Note (Signed)
NAMESHAYONA, HIBBITTS                ACCOUNT NO.:  [id number]   MEDICAL RECORD NO.:  [id number]          PATIENT TYPE:  AMB   LOCATION:  DSC                          FACILITY:  [facility]   PHYSICIAN:  Currie Paris, M.D.DATE OF BIRTH:  February 26, 1928   DATE OF PROCEDURE:  04/11/2006  DATE OF DISCHARGE:                               OPERATIVE REPORT   PREOPERATIVE DIAGNOSIS:  Right breast mass (2).   POSTOPERATIVE DIAGNOSIS:  Right breast mass (2).   OPERATION:  Excision right breast mass (2).   SURGEON:  Currie Paris, M.D.   ANESTHESIA:  Local.   CLINICAL HISTORY:  This is a 77 year old lady who recently was found to  have a small right breast mass in about the 1 o'clock position.  She had  noted that and was not well seen either by ultrasound or mammography.  On physical, I noted a second adjacent mass that felt similar that was  about 2 cm lateral to the first.  After discussion with the patient and  consultation with her cardiologist, we elected to remove this under  local anesthesia.   DESCRIPTION OF PROCEDURE:  The patient was seen in the minor procedure  room.  We confirmed the plans as removal of small breast mass, and we  identified and marked the exact masses.   The breast was then prepped with some Betadine, and timeout occurred.  I  infiltrated 1% Xylocaine as local.  I made a transverse incision,  identified the larger of the two which was about a centimeter, and  excised that sharply with scissors.  A small calcified artery had to be  clamped and tied.  The mass was completely excised.  The second smaller  mass was also removed with scissors.   A couple of bleeding points were clamped and tied with 4-0 Vicryl.  Once  everything was dry, the incision was closed in layers with 3-0 Vicryl, 4-  0 Monocryl subcuticular, plus Dermabond.   The patient tolerated the procedure well.  There were no operative  complications.  All counts were correct.      Currie Paris, M.D.  Electronically Signed    CJS/MEDQ  D:  04/11/2006  T:  04/11/2006  Job:  [id number]   cc:   Colleen Can. Deborah Chalk, M.D.  Veverly Fells. Altheimer, M.D.  [facility]

## 2010-06-22 ENCOUNTER — Telehealth: Payer: Self-pay | Admitting: Emergency Medicine

## 2010-06-22 NOTE — Telephone Encounter (Signed)
ATCx1 and line was busy. Carron Curie, CMA

## 2010-06-23 NOTE — Telephone Encounter (Signed)
[id number] 

## 2010-06-24 NOTE — Telephone Encounter (Signed)
The pt states she is wearing her O2 every night but because she sleeps on her side the nasal cannula is falling off and she wakes up confused because she's been without her oxygen. She is asking for suggestions regarding this problem. She has spoke with [facility] and they did not have any suggestions. Her new portable system with [employer] should arrive on Thursday, 06/25/2010. Pls advise.

## 2010-06-24 NOTE — Telephone Encounter (Signed)
Pt returned call

## 2010-07-03 NOTE — Telephone Encounter (Signed)
Not sure what to advise - sometimes medical tape is used to keep the [place] in place. She could try this

## 2010-07-03 NOTE — Telephone Encounter (Signed)
lmomtcb x1 

## 2010-07-06 NOTE — Telephone Encounter (Signed)
Called spoke with patient, advised of RB's recs.  Pt verbalized her understanding.  Pt states that she spoke with [employer] about this and was informed of a type of nasal cannula with "ridges in it" and that they would provide her with one.  I informed pt that I am unfamiliar with this type of cannula but to let us know when she gets it so that we may document.  Pt verbalized her understanding.

## 2010-07-06 NOTE — Telephone Encounter (Signed)
Patient called stated she was returning a call to triage from last week she can be reached at [phone].Christine Blanchard

## 2010-07-15 LAB — COMPREHENSIVE METABOLIC PANEL
ALT: 17 U/L (ref 7–35)
AST: 19 U/L
Albumin: 4.1
Alkaline Phosphatase: 40 U/L
Glucose: 88
Sodium: 139 mmol/L (ref 137–147)
Total bilirubin, fluid: 0.4

## 2010-07-15 LAB — MICROALBUMIN / CREATININE URINE RATIO: Microalb Creat Ratio: 9.85

## 2010-07-15 LAB — CBC
HCT: 36 %
MCV: 90.4 fL
platelet count: 201

## 2010-07-20 ENCOUNTER — Telehealth: Payer: Self-pay | Admitting: *Deleted

## 2010-07-20 NOTE — Telephone Encounter (Signed)
Opened in error

## 2010-07-22 ENCOUNTER — Telehealth: Payer: Self-pay | Admitting: Emergency Medicine

## 2010-07-22 NOTE — Telephone Encounter (Signed)
i see no CMN in pt's chart.  LM for Christine Blanchard x1.

## 2010-07-23 NOTE — Telephone Encounter (Signed)
[email or web address]  

## 2010-07-24 NOTE — Telephone Encounter (Signed)
Spoke with Gwen and notified no CMN on file. She states that she is going to fax one today for RB to fill out.

## 2010-07-29 ENCOUNTER — Encounter: Payer: Medicare Other | Admitting: Family Medicine

## 2010-07-30 ENCOUNTER — Telehealth: Payer: Self-pay | Admitting: Emergency Medicine

## 2010-07-30 NOTE — Telephone Encounter (Signed)
I checked with Lawson Fiscal to see if RB has received this form so we can correct it. She states has not seen this, RB has his lookat folder with him. Will forward to RB to check and make sure he has this and if not will have them resend. Please advise, thanks!

## 2010-08-02 DIAGNOSIS — H9193 Unspecified hearing loss, bilateral: Secondary | ICD-10-CM

## 2010-08-02 HISTORY — DX: Unspecified hearing loss, bilateral: H91.93

## 2010-08-04 ENCOUNTER — Telehealth: Payer: Self-pay | Admitting: Family Medicine

## 2010-08-04 ENCOUNTER — Ambulatory Visit (INDEPENDENT_AMBULATORY_CARE_PROVIDER_SITE_OTHER): Payer: Medicare Other | Admitting: Family Medicine

## 2010-08-04 ENCOUNTER — Encounter: Payer: Self-pay | Admitting: Family Medicine

## 2010-08-04 VITALS — BP 144/78 | HR 80 | Temp 98.4°F | Wt 154.0 lb

## 2010-08-04 DIAGNOSIS — I10 Essential (primary) hypertension: Secondary | ICD-10-CM

## 2010-08-04 DIAGNOSIS — E119 Type 2 diabetes mellitus without complications: Secondary | ICD-10-CM

## 2010-08-04 DIAGNOSIS — IMO0001 Reserved for inherently not codable concepts without codable children: Secondary | ICD-10-CM

## 2010-08-04 DIAGNOSIS — Z0000 Encounter for general adult medical examination without abnormal findings: Secondary | ICD-10-CM

## 2010-08-04 DIAGNOSIS — H9190 Unspecified hearing loss, unspecified ear: Secondary | ICD-10-CM

## 2010-08-04 NOTE — Telephone Encounter (Signed)
RB, have you received form yet? Please advise thanks!

## 2010-08-04 NOTE — Telephone Encounter (Signed)
Called # provided above - [id number] to have form refaxed

## 2010-08-04 NOTE — Progress Notes (Signed)
Subjective:    Patient ID: Christine Blanchard, female    DOB: October 02, 1928, 77 y.o.   MRN: [id number]  HPI CC: f/u, ? CPE  Here for medicare physical however did not fill out form.  Lives in [place].  DM - followed by Dr. Leslie Blanchard.  Brings copy of blood work with A1c 5.8%.  Recent decrease in lantus to 20u daily.  HTN - good control normally per patient.  Elevated today.  Only on imdur for this, usually not an issue.  HLD - on zetia, fenofibrate, lipitor.  Great control as evidenced by recent blood work done at Dr. Altheimer's office with LDL 56.  Vision problems - known macular degeneration, has had corneal transplants.  Followed by [facility].  Denies problem with vision but does have trouble reading small print. Hearing - went to audiologist 2 years ago, didn't think needed hearing aid.  Doesn't think has problem currently but reported positive on screening questionairre. Advanced directives - has notebook with information on advanced directives.  Has not filled out.  daughter Christine Blanchard knows pt wouldn't want extraordinary measures.  No HCPOA currently. Daughter and son live nearby [facility] and [place]). No recent falls. Dementia assessment normal.  Normal registration and recall, normal concentration. Refer to [employer].  Preventative: No colonsocopy, not recommended (h/o several diverticulitis).  Mother did die fom colon cancer.  No current problems with diverticulitis. UTD PNA, shingles, Hep B per patient. Pap - last had 3 years ago, Dr. Edward Blanchard (OBGYN).  Always monogamous relationship until husband died.  Never had abnormal.  Doesn't think would like to continue screening. Mammo - 1 year ago normal per patient.  No family or personal history of breast cancer.  Has had breast biopsies in past but all benign.  Doesn't think she would like to continue screening. Thinks had bone density scan recently (~5 years) and told normal.  Medications and allergies reviewed and updated in  chart.  Patient Active Problem List  Diagnoses  . DIABETES, TYPE 2  . HYPERTENSION  . CAD  . CVA  . DYSPNEA  . Hypoxemia  . HYPERLIPIDEMIA-MIXED  . Diverticulitis  . Asthmatic bronchitis  . Allergic rhinitis, seasonal  . Healthcare maintenance   Past Medical History  Diagnosis Date  . Hypertension   . Diabetes mellitus     followed by Dr Christine Blanchard  . Allergy   . Hyperlipidemia   . Sinus complaint   . History of chicken pox     has had shingles shot  . Diverticulitis   . History of kidney stones 1952  . Stroke     minimal R residual weakness, affects speech when tired  . Urinary incontinence     stress/urge, per D. Christine Blanchard/Christine Blanchard  . CAD (coronary artery disease)     s/p 4 cardiac caths  . Breathing difficulty 2011    on O2 since 10/2009  . Urinary tract infection     last 2011  . Syncope 2005    s/p w/u including CT scan and 24 hour Holter  . GERD (gastroesophageal reflux disease) 2004  . Macular degeneration   . Benign essential tremor     topro XL and remeron  . Herpes zoster ophthalmicus 2009    history   Past Surgical History  Procedure Date  . Cholecystectomy 1985  . Cervical fusion 2002  . Coronary artery bypass graft 2004  . Pci 2005    stents x 2  . Corneal transplant     x3  . Appendectomy 1948  .  Breast biopsy 2001    x2, both benign, last mammo 2011, last pap smear 2011  . Cardiac catheterization 2009    x5  . Cataract extraction     bilateral, corneal dystrophy  . Cardiovascular stress test 2010    nuclear, normal   History  Substance Use Topics  . Smoking status: Never Smoker   . Smokeless tobacco: Never Used  . Alcohol Use: No   Family History  Problem Relation Age of Onset  . Cancer Mother     colon  . Heart disease Father   . Cancer Brother     bladder   Allergies  Allergen Reactions  . Actos (Pioglitazone Hydrochloride) Shortness Of Breath    CHF  . Atenolol Other (See Comments)    lethargy  . Byetta Nausea Only   . Codeine     REACTION: vomiting  . Morphine     REACTION: nausea  . Pioglitazone     REACTION: heart failure  . Sulfa Antibiotics   . Vicodin (Hydrocodone-Acetaminophen) Nausea And Vomiting   Current Outpatient Prescriptions on File Prior to Visit  Medication Sig Dispense Refill  . albuterol (PROVENTIL,VENTOLIN) 90 MCG/ACT inhaler Inhale 2 puffs into the lungs every 6 (six) hours as needed for wheezing.  3 each  3  . aspirin 325 MG tablet Take 325 mg by mouth daily.        Marland Kitchen atorvastatin (LIPITOR) 20 MG tablet Take 20 mg by mouth Nightly.       . benzonatate (TESSALON) 100 MG capsule Take 100 mg by mouth 3 (three) times daily as needed.        Jennette Banker Sodium 30-100 MG CAPS as needed.        . clopidogrel (PLAVIX) 75 MG tablet Take 75 mg by mouth daily.        . ergocalciferol (VITAMIN D2) 50000 UNITS capsule Take 50,000 Units by mouth once a week.        . escitalopram (LEXAPRO) 20 MG tablet Take 20 mg by mouth daily.        . eszopiclone (LUNESTA) 1 MG TABS Take 1 mg by mouth at bedtime.       Marland Kitchen ezetimibe (ZETIA) 10 MG tablet Take 10 mg by mouth daily.        . fenofibrate micronized (ANTARA) 130 MG capsule Take 130 mg by mouth daily.        . fluticasone (FLONASE) 50 MCG/ACT nasal spray 2 sprays by Nasal route daily.        . insulin glargine (LANTUS) 100 UNIT/ML injection Inject 20 Units into the skin every morning.       . isosorbide mononitrate (IMDUR) 60 MG 24 hr tablet Take 90 mg by mouth 2 (two) times daily. 1 and 1/2 two times a day      . loratadine (CLARITIN) 10 MG tablet Take 1 tablet (10 mg total) by mouth daily.  90 tablet  3  . metformin (FORTAMET) 1000 MG (OSM) 24 hr tablet Take 1,000 mg by mouth daily after supper.        . mirtazapine (REMERON) 15 MG tablet Take 15 mg by mouth daily.        . Multiple Vitamin (MULTIVITAMIN) capsule Take 1 capsule by mouth daily.        . nitroGLYCERIN (NITROSTAT) 0.4 MG SL tablet Place 0.4 mg under the tongue as  needed.        . prednisoLONE sodium phosphate (INFLAMASE FORTE) 1 % ophthalmic  solution Place 1 drop into both eyes 2 (two) times daily.        . RABEprazole (ACIPHEX) 20 MG tablet Take 20 mg by mouth daily.        . ranolazine (RANEXA) 1000 MG SR tablet Take by mouth 2 (two) times daily.       . sitaGLIPtan (JANUVIA) 100 MG tablet Take 100 mg by mouth daily.        Marland Kitchen DISCONTD: Mometasone Furo-Formoterol Fum (DULERA) 100-5 MCG/ACT AERO Take 2 puffs first thing in am and then another 2 puffs about 12 hours later.     3 Inhaler  3   Review of Systems  Constitutional: Negative for fever, chills, activity change, appetite change, fatigue and unexpected weight change.  HENT: Negative for hearing loss and neck pain.   Eyes: Positive for visual disturbance.  Respiratory: Negative for cough, chest tightness, shortness of breath and wheezing.   Cardiovascular: Negative for chest pain, palpitations and leg swelling.  Gastrointestinal: Negative for nausea, vomiting, abdominal pain, diarrhea, constipation, blood in stool and abdominal distention.  Genitourinary: Negative for hematuria and difficulty urinating.  Musculoskeletal: Negative for myalgias and arthralgias.  Skin: Negative for rash.  Neurological: Positive for weakness (residual R side after CVA). Negative for dizziness, seizures, syncope and headaches.  Hematological: Does not bruise/bleed easily.  Psychiatric/Behavioral: Negative for dysphoric mood. The patient is not nervous/anxious.       Objective:   Physical Exam  Nursing note and vitals reviewed. Constitutional: She appears well-developed and well-nourished. No distress.  HENT:  Head: Normocephalic and atraumatic.  Mouth/Throat: Oropharynx is clear and moist. No oropharyngeal exudate.  Eyes: Conjunctivae and EOM are normal. Pupils are equal, round, and reactive to light. No scleral icterus.  Neck: Normal range of motion. Neck supple.  Cardiovascular: Normal rate, regular rhythm,  normal heart sounds and intact distal pulses.   No murmur heard. Pulmonary/Chest: Effort normal and breath sounds normal. No respiratory distress. She has no wheezes. She has no rales.  Abdominal: Soft.  Musculoskeletal: Normal range of motion. She exhibits no edema.  Skin: Skin is warm and dry. No rash noted.  Psychiatric: She has a normal mood and affect.          Assessment & Plan:

## 2010-08-04 NOTE — Patient Instructions (Addendum)
Return in 6 months or as needed.  Good to see you today.  Call us with questions. Keep appointments with Dr. Mariah Milling, Delton Coombes and Altheimer. Think about filling out forms for advanced directives and choosing health care power of attorney.  Let me know what you decide. I have provided you with a copy of your personalized plan for preventive services. Please take the time to review along with your updated medication list.

## 2010-08-04 NOTE — Assessment & Plan Note (Signed)
Hearing screen borderline passed, however screening questions answers positively.  States would like to have formal eval so will refer.

## 2010-08-04 NOTE — Telephone Encounter (Signed)
Please have [employer] refax form that Dr. Delton Coombes needs to sign or complete. Form is not in his look at.

## 2010-08-04 NOTE — Telephone Encounter (Signed)
Please notify hearing screen returned borderline, however given screening questions returned positive, I would like to set her up with formal hearing evaluation if pt amenable. Have placed order in chart.

## 2010-08-04 NOTE — Assessment & Plan Note (Addendum)
I have personally reviewed the Medicare Annual Wellness questionnaire and have noted 1. The patient's medical and social history 2. Their use of alcohol, tobacco or illicit drugs 3. Their current medications and supplements 4. The patient's functional ability including ADL's, fall risks, home safety risks and hearing or visual             impairment. 5. Diet and physical activities 6. Evidence for depression or mood disorders The patients weight, height, BMI and visual acuity have been recorded in the chart I have made referrals, counseling and provided education to the patient based review of the above and I have provided the pt with a written personalized care plan for preventive services. UTD immunizations, declines other preventative measures.

## 2010-08-04 NOTE — Assessment & Plan Note (Signed)
Good control as of last A1c 5.8%.  lantus has been decreased to 20u.  Followed by Altheimer.

## 2010-08-04 NOTE — Telephone Encounter (Signed)
[facility] WILL REFAX FORM

## 2010-08-04 NOTE — Assessment & Plan Note (Signed)
Elevated today.  If next visit remaining elevated, consider starting ACEI.  Reviewed Dr. Altheimer's recent note.

## 2010-08-06 ENCOUNTER — Encounter: Payer: Self-pay | Admitting: Family Medicine

## 2010-08-06 NOTE — Telephone Encounter (Signed)
Patient notified and is fine with going to see an audiologist. She prefers one in [place] as it's closer to her. I advised that Aram Beecham or Shirlee Limerick would be calling to get something scheduled with her. She verbalized understanding.

## 2010-08-07 ENCOUNTER — Telehealth: Payer: Self-pay | Admitting: Emergency Medicine

## 2010-08-07 NOTE — Telephone Encounter (Signed)
Form signed by Dr. Delton Coombes and faxed back to [employer] at [phone].

## 2010-08-07 NOTE — Telephone Encounter (Signed)
Form filled out today, planning to fax this pm

## 2010-08-07 NOTE — Telephone Encounter (Signed)
Called, spoke with pt.  She states recently she got a smaller o2 portable system bc the one she had weighed 10lbs which was too heavy.  The current system will only last 2 hours on 2lpm.  It will recharge in the car, airplane, and house but she is in church for at least 2 1/2 hours.  Pt would like to know if she could turn o2 down to 1lpm when she is resting so the tanks will last longer.  She is aware RB is out of office until Monday and ok with call back then.  RB, pls advise.  Thanks!

## 2010-08-10 NOTE — Telephone Encounter (Signed)
Pt states she answered her own so she doesn't need a return call.Christine Blanchard

## 2010-08-12 ENCOUNTER — Telehealth: Payer: Self-pay | Admitting: Emergency Medicine

## 2010-08-12 NOTE — Telephone Encounter (Signed)
lmomtcb x1 

## 2010-08-12 NOTE — Telephone Encounter (Signed)
RE: CMN FOR O2 - AT TOP OF SECTION A: CROSS THRU "2012" AND WRITE IN 2011- INITIAL AND DATE. THEN (NEXT TO IT IS THE REVISE: CHANGE THE MONTH AND DAY TO 06/11/10- INITIAL AND DATE. THEN FAX BACK TO ATTNDedra Skeens AT [phone]

## 2010-08-14 NOTE — Telephone Encounter (Signed)
[email or web address]  

## 2010-08-18 NOTE — Telephone Encounter (Signed)
I spoke with Denette at [employer] and she states the pt is due for a recertification for her oxygen. Pt has an appt on 09-23-10. So Denette states to correct the revised date from 06-11-10 to 10-31-2010. She states to hold the forms until the appr on 09-23-10 and fax the form with doctor initials and copy of OV note to [facility] a number given. Lawson Fiscal is aware. Form given to her. Carron Curie, CMA

## 2010-08-20 ENCOUNTER — Encounter: Payer: Self-pay | Admitting: Family Medicine

## 2010-09-08 ENCOUNTER — Telehealth: Payer: Self-pay | Admitting: Emergency Medicine

## 2010-09-08 NOTE — Telephone Encounter (Signed)
Lawson Fiscal please advise if you have received this fax. thanks

## 2010-09-08 NOTE — Telephone Encounter (Signed)
I do not have any forms from [employer] on this patient. They will need to refax the forms.

## 2010-09-09 NOTE — Telephone Encounter (Signed)
Forms re-faxed and placed in RB look at.

## 2010-09-09 NOTE — Telephone Encounter (Signed)
I spoke with [name] and she will refax the forms to the triage fax w/ attn: Lawson Fiscal.

## 2010-09-10 NOTE — Telephone Encounter (Signed)
Signed and returned to Lori 

## 2010-09-10 NOTE — Telephone Encounter (Signed)
Form faxed back to [employer] and placed in RB scan folder.

## 2010-09-11 ENCOUNTER — Telehealth: Payer: Self-pay | Admitting: Emergency Medicine

## 2010-09-11 NOTE — Telephone Encounter (Signed)
Called, spoke with [place].  States the date at the top of the CMN which is 07/22/2010 needs to be changed to 06/11/2010 bc this is the date the pt was actually tested.  She will fax the form that needs to be changed to triage.  When this is changed by RB, he will needs to cross out the wrong date and put his initials along with date that he made the correction beside this and then write in the correct date of 06/11/2010.  WIll hold message in triage until fax is received.

## 2010-09-14 NOTE — Telephone Encounter (Signed)
Forms have been corrected and faxed, then placed in RB scan folder.

## 2010-09-23 ENCOUNTER — Ambulatory Visit (INDEPENDENT_AMBULATORY_CARE_PROVIDER_SITE_OTHER): Payer: Medicare Other | Admitting: Emergency Medicine

## 2010-09-23 ENCOUNTER — Encounter: Payer: Self-pay | Admitting: Emergency Medicine

## 2010-09-23 DIAGNOSIS — R0602 Shortness of breath: Secondary | ICD-10-CM

## 2010-09-23 NOTE — Progress Notes (Signed)
Subjective:    Patient ID: Christine Blanchard, female    DOB: 12/10/1928, 77 y.o.   MRN: [id number]  HPI 77 yo woman, never smoker, CAD/CABG, HTN, DM, CVA, OA. Followed by Dr Deborah Chalk for her CAD, HTN.  Initial eval 10/03/09 for slow progressive worsening dyspnea. Was evaluated with Lexiscan, showed mild progression of her CAD (no intervention recommended), also PFT's restriction vs mixed disease.  Sleeps sitting up because she feels a pressure in her chest when supine. Gets waked up at night. She snores some. Having hoarse voice, dyspnea with talking. Has nasal gtt, recent rx for sinusitis.   ROV 10/30/09 -- returns for dyspnea with restriction/mixed disease. CXR without infiltrates last visit. MIP and MEP performed, both decreased (40 - 46% pred). Her breathing is unchanged since last visit, doesn't believe she has the energy to do the work she wants to do. This seems to be related to weakness as opposed to overt dyspnea. FEV1 65% predicted.   ROV 12/11/09 -- follows up today for her dyspnea. PFT with mixed disease. We started O2 last time and she feels better. We also did trial of SABA to see if it helped - no real difference with the inhaler.   04/24/2010 ov cc acutely worse cough x 1 week productive minimal cream mucus and doe but not at rest, cough worse day than night.  Tx Doxy x 7 d, and Dulera   05/06/2010 Follow up OV--Returns for 2 week follow up. Last visit she had flare of cough/congestion. Tx for bronchitis with Doxycycline. She was given Elwin Sleight twice daily. She is feeling better with decreased dyspnea. Still has some cough with white/cream mucus. Decreased cough though. Does not feel as bad. Feels Elwin Sleight is helping. Energy level is slightly better. Appetitie is a little better.   06/11/10 -- follow up visit, mixed disease on PFT. As above, was seen for acute visit, complaining of cough + mucous prod + congestion in throat and chest. Was given doxy and then on Dulera - she thought the [facility]  might have helped her but she isn;t sure. She has been on and off of it, used it for increased congestion. She doesn't have a SABA at this time. Dr Sherene Sires documented that her HFA technique is poor, may explain her poor response to the med back in 11/2009. Still with cough, productive of colored mucous occasionally but not always - better than before treated with doxycycline. She has sx consistent with nasal allergies as well.   ROV 09/23/10 -- mixed AFL and restriction. Allergies. Last time we stopped scheduled dulera (she was using intermittently) and went to albuterol + spacer. She is using ventolin about 5x a week. The spacer seems to be helping with delivery and effectiveness.  She has obtained a portable concentrator. Today she complains of low energy in the am when she wakes up, she feels better about an hour after she takes her meds.   Review of Systems As per HPI    Objective:   Physical Exam Gen: Pleasant, well-nourished, in no distress,  normal affect, on O2  ENT: No lesions,  mouth clear,  oropharynx clear, no postnasal drip  Neck: No JVD, no TMG, no carotid bruits  Lungs: No use of accessory muscles, no dullness to percussion, clear without rales or rhonchi  Cardiovascular: RRR, heart sounds normal, no murmur or gallops, no peripheral edema  Musculoskeletal: No deformities, no cyanosis or clubbing  Neuro: alert, non focal  Skin: Warm, no lesions or rashes  Assessment & Plan:  DYSPNEA Mixed disease - her HFA technique is better since getting a spacer, and she feels the med is more effective.  - continue ventolin prn - O2 at all times - rov 4 months

## 2010-09-23 NOTE — Assessment & Plan Note (Signed)
Mixed disease - her HFA technique is better since getting a spacer, and she feels the med is more effective.  - continue ventolin prn - O2 at all times - rov 4 months

## 2010-09-23 NOTE — Patient Instructions (Signed)
Please continue to use your oxygen at all times.  Use your Ventolin as needed through a spacer.  Stay on your fluticasone nose spray and your loratadine Follow up with Dr Delton Coombes in 4 months or sooner if you have any problems.

## 2010-09-24 ENCOUNTER — Telehealth: Payer: Self-pay | Admitting: Emergency Medicine

## 2010-09-24 NOTE — Telephone Encounter (Signed)
Will forward to Alida to see if she has the CMN. Please advise, thanks!

## 2010-09-25 ENCOUNTER — Telehealth: Payer: Self-pay | Admitting: Emergency Medicine

## 2010-09-25 NOTE — Telephone Encounter (Signed)
Spoke with [place] who says she did get the cmn signed but the date was not on it will fax back to [facility] fax # we will date and fax back .Kandice Hams

## 2010-09-25 NOTE — Telephone Encounter (Signed)
Called [employer], spoke with [employer].  States the CMN she has needs a correction - beside the date 06/11/2010 has RB's initials.  He needs to write in the date he put these initials there - this can be today's date.  She will refax this form to triage.  Also, states pt will need OV with RB prior to Sept 30, 2012 bc she is coming up for a recert on her o2.  Will need to document qualifying sats in this visit.  I attempted to call pt's home # but na and unable to leave message -- will await fax to triage and will try to call pt back later to schedule follow up with RB prior to 11/01/10.

## 2010-09-28 NOTE — Telephone Encounter (Signed)
Alida states that this was completed and faxed back. I spoke with the pt and set appt on 10-01-10 to have oxygen recert. Carron Curie, CMA

## 2010-09-29 ENCOUNTER — Telehealth: Payer: Self-pay | Admitting: Emergency Medicine

## 2010-09-29 NOTE — Telephone Encounter (Signed)
Spoke with Christine Blanchard  @ [employer] , who says the signed cmn that we have needs the length of need filled in  place of service needs to be filled in and  initialed and dated and corrected date needs to be which has Dr Tracie Harrier initial needs to be dated and 09/10/10 will be fine which is same date as signed cmn.  Will get Dr Delton Coombes to initial and date corrections and will fax back .Kandice Hams

## 2010-09-29 NOTE — Telephone Encounter (Signed)
Alida please advise. They state the cmn is still not correct. See previous messages as well. thanks

## 2010-10-01 ENCOUNTER — Ambulatory Visit (INDEPENDENT_AMBULATORY_CARE_PROVIDER_SITE_OTHER): Payer: Medicare Other | Admitting: Emergency Medicine

## 2010-10-01 ENCOUNTER — Encounter: Payer: Self-pay | Admitting: Emergency Medicine

## 2010-10-01 DIAGNOSIS — R0602 Shortness of breath: Secondary | ICD-10-CM

## 2010-10-01 NOTE — Assessment & Plan Note (Signed)
Mixed disease on PFT, doesn't really explain why she is hypoxemic. Her TTE does not show LV dysfxn or pulm HTN.  - continue albuterol prn w a spacer - continue o2 - rov 4 months.

## 2010-10-01 NOTE — Patient Instructions (Signed)
Please continue to use your oxygen at all times We will continue Ventolin as needed using a spacer.  Follow up in 4 months or sooner if you have any problems.

## 2010-10-01 NOTE — Progress Notes (Signed)
Subjective:    Patient ID: Christine Blanchard, female    DOB: 1928/11/07, 77 y.o.   MRN: [id number]  HPI 77 yo woman, never smoker, CAD/CABG, HTN, DM, CVA, OA. Followed by Dr Deborah Chalk for her CAD, HTN.  Initial eval 10/03/09 for slow progressive worsening dyspnea. Was evaluated with Lexiscan, showed mild progression of her CAD (no intervention recommended), also PFT's restriction vs mixed disease.  Sleeps sitting up because she feels a pressure in her chest when supine. Gets waked up at night. She snores some. Having hoarse voice, dyspnea with talking. Has nasal gtt, recent rx for sinusitis.   ROV 10/30/09 -- returns for dyspnea with restriction/mixed disease. CXR without infiltrates last visit. MIP and MEP performed, both decreased (40 - 46% pred). Her breathing is unchanged since last visit, doesn't believe she has the energy to do the work she wants to do. This seems to be related to weakness as opposed to overt dyspnea. FEV1 65% predicted.   ROV 12/11/09 -- follows up today for her dyspnea. PFT with mixed disease. We started O2 last time and she feels better. We also did trial of SABA to see if it helped - no real difference with the inhaler.   04/24/2010 ov cc acutely worse cough x 1 week productive minimal cream mucus and doe but not at rest, cough worse day than night.  Tx Doxy x 7 d, and Dulera   05/06/2010 Follow up OV--Returns for 2 week follow up. Last visit she had flare of cough/congestion. Tx for bronchitis with Doxycycline. She was given Elwin Sleight twice daily. She is feeling better with decreased dyspnea. Still has some cough with white/cream mucus. Decreased cough though. Does not feel as bad. Feels Elwin Sleight is helping. Energy level is slightly better. Appetitie is a little better.   06/11/10 -- follow up visit, mixed disease on PFT. As above, was seen for acute visit, complaining of cough + mucous prod + congestion in throat and chest. Was given doxy and then on Dulera - she thought the [facility]  might have helped her but she isn;t sure. She has been on and off of it, used it for increased congestion. She doesn't have a SABA at this time. Dr Sherene Sires documented that her HFA technique is poor, may explain her poor response to the med back in 11/2009. Still with cough, productive of colored mucous occasionally but not always - better than before treated with doxycycline. She has sx consistent with nasal allergies as well.   ROV 09/23/10 -- mixed AFL and restriction. Allergies. Last time we stopped scheduled dulera (she was using intermittently) and went to albuterol + spacer. She is using ventolin about 5x a week. The spacer seems to be helping with delivery and effectiveness.  She has obtained a portable concentrator. Today she complains of low energy in the am when she wakes up, she feels better about an hour after she takes her meds.   ROV 10/01/10 -- Mixed AFL and restriction, allergies, hypoxemia. Hx of CAD/CABG. She is here today to requalify for O2 - she desats and qualifies at rest.   Review of Systems As per HPI    Objective:   Physical Exam Gen: Pleasant, well-nourished, in no distress,  normal affect, on O2  ENT: No lesions,  mouth clear,  oropharynx clear, no postnasal drip  Neck: No JVD, no TMG, no carotid bruits  Lungs: No use of accessory muscles, no dullness to percussion, clear without rales or rhonchi  Cardiovascular: RRR, heart sounds  normal, no murmur or gallops, no peripheral edema  Musculoskeletal: No deformities, no cyanosis or clubbing  Neuro: alert, non focal  Skin: Warm, no lesions or rashes  TTE 03/2010 Study Conclusions  - Left ventricle: The cavity size was normal. Wall thickness was increased in a pattern of mild LVH. Systolic function was normal. The estimated ejection fraction was in the range of 65% to 70%. Wall motion consistent with post operative state. Doppler parameters are consistent with abnormal left ventricular relaxation (grade 1 diastolic  dysfunction). - Left atrium: The atrium was mildly dilated. - Right ventricle: Systolic function was normal. - Tricuspid valve: Mild regurgitation. - Pulmonary arteries: Systolic pressure was within the normal range. Impressions:  - Normal study.   Assessment & Plan:  DYSPNEA Mixed disease on PFT, doesn't really explain why she is hypoxemic. Her TTE does not show LV dysfxn or pulm HTN.  - continue albuterol prn w a spacer - continue o2 - rov 4 months.

## 2010-10-19 ENCOUNTER — Ambulatory Visit: Admitting: Cardiovascular Disease

## 2010-10-23 ENCOUNTER — Encounter: Payer: Self-pay | Admitting: Cardiovascular Disease

## 2010-10-26 ENCOUNTER — Ambulatory Visit: Admitting: Cardiovascular Disease

## 2010-10-29 ENCOUNTER — Ambulatory Visit (INDEPENDENT_AMBULATORY_CARE_PROVIDER_SITE_OTHER): Payer: Medicare Other | Admitting: Cardiovascular Disease

## 2010-10-29 ENCOUNTER — Encounter: Payer: Self-pay | Admitting: Cardiovascular Disease

## 2010-10-29 DIAGNOSIS — R079 Chest pain, unspecified: Secondary | ICD-10-CM

## 2010-10-29 DIAGNOSIS — I6350 Cerebral infarction due to unspecified occlusion or stenosis of unspecified cerebral artery: Secondary | ICD-10-CM

## 2010-10-29 DIAGNOSIS — I10 Essential (primary) hypertension: Secondary | ICD-10-CM

## 2010-10-29 DIAGNOSIS — I2510 Atherosclerotic heart disease of native coronary artery without angina pectoris: Secondary | ICD-10-CM

## 2010-10-29 DIAGNOSIS — E119 Type 2 diabetes mellitus without complications: Secondary | ICD-10-CM

## 2010-10-29 DIAGNOSIS — R0602 Shortness of breath: Secondary | ICD-10-CM

## 2010-10-29 DIAGNOSIS — E785 Hyperlipidemia, unspecified: Secondary | ICD-10-CM

## 2010-10-29 MED ORDER — ISOSORBIDE MONONITRATE ER 60 MG PO TB24: ORAL_TABLET | ORAL | Status: DC

## 2010-10-29 NOTE — Assessment & Plan Note (Signed)
Etiology of her shortness of breath is concerning for underlying pulmonary disease though cannot exclude worsening coronary disease. Given her history of stroke after cardiac catheterization in the past, we have ordered a cardiac CTA to evaluate her bypass grafts. If these appear to be patent and well visualized, we will likely be able to avoid a catheterization.

## 2010-10-29 NOTE — Assessment & Plan Note (Signed)
We have encouraged continued exercise, careful diet management in an effort to lose weight. 

## 2010-10-29 NOTE — Assessment & Plan Note (Signed)
History of CVA in the past following a cardiac catheterization per the patient. She reports Dr. Deborah Chalk stating she had significant diffuse aortic atherosclerosis at the last catheterization.

## 2010-10-29 NOTE — Patient Instructions (Signed)
You are doing well. No medication changes were made. We will schedule a cardiac CTA to look at the coronary arteries for your symptoms of shortness of breath and angina. Please call us if you have new issues that need to be addressed before your next appt.  We will call you for a follow up Appt. In 6 months

## 2010-10-29 NOTE — Assessment & Plan Note (Signed)
Cholesterol is at goal on the current lipid regimen. No changes to the medications were made.  

## 2010-10-29 NOTE — Progress Notes (Signed)
Patient ID: Christine Blanchard, female    DOB: 1928/04/21, 77 y.o.   MRN: [id number]  HPI Comments: Christine Blanchard is a very pleasant 77 year old woman with history of coronary artery disease, bypass surgery in 2004, hyperlipidemia, PCI 6 months after her bypass, repeat PCI one year later (She does report having a stroke after her cardiac catheterization), diabetes, hypertension, spinal stenosis who currently lives in a nursing home in [place], who has chronic unsteady gait  with worsening of her shortness of breath over the past year and now on chronic oxygen who presents for routine followup.  She continues to require increasing amounts of oxygen. She is very short of breath without her nasal cannula oxygen with exertion. She feels that her breathing is worse, she is weak which is similar symptoms to when she had a blockage and needed a stent. She also has worsening back pain radiating to her front which was her previous anginal equivalent. She is concerned that she might have a blockage that needs to be fixed though she does not particularly want a cardiac catheterization given the history of stroke following a catheterization.  Echocardiogram performed February 2012 was essentially normal with mild TR, normal right ventricular systolic pressure  Stress test in August 2011 was normal with no ischemia, normal LV function.  EKG shows Normal sinus rhythm with rate 83 beats per minute with poor R-wave progression through the anterior precordial leads, nonspecific T wave abnormality, prolonged QTC   Outpatient Encounter Prescriptions as of 10/29/2010  Medication Sig Dispense Refill  . albuterol (PROVENTIL,VENTOLIN) 90 MCG/ACT inhaler Inhale 2 puffs into the lungs every 6 (six) hours as needed for wheezing.  3 each  3  . aspirin 325 MG tablet Take 325 mg by mouth daily.        Marland Kitchen atorvastatin (LIPITOR) 20 MG tablet Take 20 mg by mouth Nightly.       . benzonatate (TESSALON) 100 MG capsule Take 100 mg by  mouth 3 (three) times daily as needed.        Jennette Banker Sodium 30-100 MG CAPS as needed.        . clopidogrel (PLAVIX) 75 MG tablet Take 75 mg by mouth daily.        . ergocalciferol (VITAMIN D2) 50000 UNITS capsule Take 50,000 Units by mouth once a week.        . escitalopram (LEXAPRO) 20 MG tablet Take 20 mg by mouth daily.        . eszopiclone (LUNESTA) 1 MG TABS Take 1 mg by mouth at bedtime.       Marland Kitchen ezetimibe (ZETIA) 10 MG tablet Take 10 mg by mouth daily.        . fenofibrate micronized (ANTARA) 130 MG capsule Take 130 mg by mouth daily.        . fluticasone (FLONASE) 50 MCG/ACT nasal spray 2 sprays by Nasal route daily.        . insulin glargine (LANTUS) 100 UNIT/ML injection Inject 20 Units into the skin every morning.       . isosorbide mononitrate (IMDUR) 60 MG 24 hr tablet 1 and 1/2 two times a day  270 tablet  3  . loratadine (CLARITIN) 10 MG tablet Take 1 tablet (10 mg total) by mouth daily.  90 tablet  3  . metformin (FORTAMET) 1000 MG (OSM) 24 hr tablet Take 1,000 mg by mouth daily after supper.        . mirtazapine (REMERON) 15 MG tablet Take  15 mg by mouth daily.        . Multiple Vitamin (MULTIVITAMIN) capsule Take 1 capsule by mouth daily.        . nitroGLYCERIN (NITROSTAT) 0.4 MG SL tablet Place 0.4 mg under the tongue as needed.        . prednisoLONE sodium phosphate (INFLAMASE FORTE) 1 % ophthalmic solution Place 1 drop into both eyes 2 (two) times daily.        . RABEprazole (ACIPHEX) 20 MG tablet Take 20 mg by mouth daily.        . ranolazine (RANEXA) 1000 MG SR tablet Take by mouth 2 (two) times daily.       . sitaGLIPtan (JANUVIA) 100 MG tablet Take 100 mg by mouth daily.           Review of Systems  Constitutional: Positive for fatigue. Negative for fever and unexpected weight change.  HENT: Negative.   Eyes: Negative.   Respiratory: Positive for shortness of breath. Negative for apnea, cough, choking, chest tightness, wheezing and stridor.     Cardiovascular: Positive for chest pain.  Gastrointestinal: Negative.   Musculoskeletal: Negative.   Skin: Negative.   Neurological: Negative.   Hematological: Negative.   Psychiatric/Behavioral: Negative.   All other systems reviewed and are negative.    BP 145/72  Pulse 67  Ht 5\' 5"  (1.651 m)  Wt 158 lb (71.668 kg)  BMI 26.29 kg/m2   Physical Exam  Nursing note and vitals reviewed. Constitutional: She is oriented to person, place, and time. She appears well-developed and well-nourished.  HENT:  Head: Normocephalic.  Nose: Nose normal.  Mouth/Throat: Oropharynx is clear and moist.  Eyes: Conjunctivae are normal. Pupils are equal, round, and reactive to light.  Neck: Normal range of motion. Neck supple. No JVD present.  Cardiovascular: Normal rate, regular rhythm, S1 normal, S2 normal and intact distal pulses.  Exam reveals no gallop and no friction rub.   Murmur heard.  Crescendo systolic murmur is present with a grade of 2/6  Pulmonary/Chest: Effort normal and breath sounds normal. No respiratory distress. She has no wheezes. She has no rales. She exhibits no tenderness.  Abdominal: Soft. Bowel sounds are normal. She exhibits no distension. There is no tenderness.  Musculoskeletal: Normal range of motion. She exhibits no edema and no tenderness.  Lymphadenopathy:    She has no cervical adenopathy.  Neurological: She is alert and oriented to person, place, and time. Coordination normal.  Skin: Skin is warm and dry. No rash noted. No erythema.  Psychiatric: She has a normal mood and affect. Her behavior is normal. Judgment and thought content normal.         Assessment and Plan

## 2010-10-29 NOTE — Assessment & Plan Note (Signed)
I'm concerned about underlying coronary disease and graft disease as a cause of her anginal symptoms, shortness of breath and fatigue. Cardiac CTA will be scheduled.

## 2010-10-29 NOTE — Assessment & Plan Note (Signed)
Blood pressure is well controlled on today's visit. No changes made to the medications. 

## 2010-11-02 ENCOUNTER — Encounter: Payer: Self-pay | Admitting: Family Medicine

## 2010-11-02 DIAGNOSIS — E559 Vitamin D deficiency, unspecified: Secondary | ICD-10-CM | POA: Insufficient documentation

## 2010-11-03 ENCOUNTER — Encounter: Payer: Self-pay | Admitting: Cardiovascular Disease

## 2010-11-04 ENCOUNTER — Telehealth: Payer: Self-pay | Admitting: Emergency Medicine

## 2010-11-04 ENCOUNTER — Telehealth: Payer: Self-pay | Admitting: *Deleted

## 2010-11-04 MED ORDER — METOPROLOL TARTRATE 50 MG PO TABS: ORAL_TABLET | ORAL | Status: DC

## 2010-11-04 NOTE — Telephone Encounter (Signed)
Pt was given instructions for cardiac CTA to hold Metformin 48 hrs prior and post procedure. Last dose to be 10/23, and will restart 10/29. Pt will hold other diabetes meds the AM of procedure. NPO after 10am and will take diabetes meds after she eats. Pt will take metoprolol tart 50mg  the PM prior and right before she leaves for procedure, notified pt Rx sent. Pt verbalized understanding and will call with any further questions.

## 2010-11-04 NOTE — Telephone Encounter (Signed)
LMTCB

## 2010-11-05 NOTE — Telephone Encounter (Signed)
Called and spoke with pt.  Pt states Dr. Mariah Milling has ordered a Cardiac CTA which is scheduled for 10/26.  Pt states she had a stroke previously when she had a heart cath done and therefore this is why Dr. Mariah Milling ordered the Cardiac CTA. PT is wanting to know if this test will benefit Dr. Delton Coombes and help to explain her dyspnea and hypoxemia from a pulm standpoint.  RB, please advise.  Thanks.

## 2010-11-17 NOTE — Telephone Encounter (Signed)
Dr. Delton Coombes - pls advise.  Thanks!

## 2010-11-24 ENCOUNTER — Telehealth: Payer: Self-pay | Admitting: *Deleted

## 2010-11-24 NOTE — Telephone Encounter (Signed)
Pt's daughter is very concerned with pt's driving back and forth to procedures in [place] (or driving in general) and has tried talking to other providers but pt does not want her to. Pt feels she can still drive. Daughter concerned due to pt's multiple medical concerns (on O2, DM complications...etc). Since pt has procedure in [place] this week (CTA) she called to notify MD of her concern with he multiple trips to [place]. I told daughter would forward information, but this probably needs to be evaluated by pt's PCP as well, and she understands that. Daughter does live out of town, but she will come with pt to her next ov to talk with Dr. Mariah Milling. Will forward msg in meantime, just FYI.

## 2010-11-26 NOTE — Telephone Encounter (Signed)
Paged Dr. Delton Coombes and no response, spoke with Lawson Fiscal and she advised to inform the pt to go ahead and have RB CC'd on the results to have scanned in her chart.   lmomtcb x 1.

## 2010-11-27 ENCOUNTER — Ambulatory Visit (HOSPITAL_COMMUNITY)
Admission: RE | Admit: 2010-11-27 | Discharge: 2010-11-27 | Disposition: A | Discharge status: Home / Self Care | Payer: Medicare Other | Source: Ambulatory Visit | Attending: Cardiovascular Disease | Admitting: Cardiovascular Disease

## 2010-11-27 ENCOUNTER — Other Ambulatory Visit: Payer: Self-pay | Admitting: Cardiovascular Disease

## 2010-11-27 DIAGNOSIS — R079 Chest pain, unspecified: Secondary | ICD-10-CM

## 2010-11-27 DIAGNOSIS — J4489 Other specified chronic obstructive pulmonary disease: Secondary | ICD-10-CM | POA: Insufficient documentation

## 2010-11-27 DIAGNOSIS — I25810 Atherosclerosis of coronary artery bypass graft(s) without angina pectoris: Secondary | ICD-10-CM | POA: Insufficient documentation

## 2010-11-27 DIAGNOSIS — I2584 Coronary atherosclerosis due to calcified coronary lesion: Secondary | ICD-10-CM | POA: Insufficient documentation

## 2010-11-27 DIAGNOSIS — R0602 Shortness of breath: Secondary | ICD-10-CM

## 2010-11-27 DIAGNOSIS — Z8673 Personal history of transient ischemic attack (TIA), and cerebral infarction without residual deficits: Secondary | ICD-10-CM | POA: Insufficient documentation

## 2010-11-27 DIAGNOSIS — I2510 Atherosclerotic heart disease of native coronary artery without angina pectoris: Secondary | ICD-10-CM

## 2010-11-27 DIAGNOSIS — Z9981 Dependence on supplemental oxygen: Secondary | ICD-10-CM | POA: Insufficient documentation

## 2010-11-27 DIAGNOSIS — J449 Chronic obstructive pulmonary disease, unspecified: Secondary | ICD-10-CM | POA: Insufficient documentation

## 2010-11-27 MED ORDER — IOHEXOL 350 MG/ML SOLN
80.0000 mL | Freq: Once | INTRAVENOUS | Status: AC | PRN
  Administered 2010-11-27: 80 mL via INTRAVENOUS

## 2010-11-27 NOTE — Telephone Encounter (Signed)
Spoke w/ pt & relayed previous message.  Christine Blanchard

## 2010-12-03 ENCOUNTER — Telehealth: Payer: Self-pay | Admitting: Emergency Medicine

## 2010-12-03 NOTE — Telephone Encounter (Signed)
Returning call can be reached at [phone].Christine Blanchard

## 2010-12-03 NOTE — Telephone Encounter (Signed)
LMTCB

## 2010-12-03 NOTE — Telephone Encounter (Signed)
Pt requested the heart scan she had done this week at [facility] be sent to dr byrum, dr Mariah Milling was the ordering physician and he has called her with the results but she wants to speak with dr byrum because she states his thoughts mean a lot to her. i did advise the pt any questions about this scan should go to the dr that ordered it but she still insisted i send this to dr byrum.

## 2010-12-04 ENCOUNTER — Encounter: Payer: Self-pay | Admitting: Cardiovascular Disease

## 2010-12-08 ENCOUNTER — Encounter: Payer: Self-pay | Admitting: Cardiovascular Disease

## 2010-12-08 ENCOUNTER — Ambulatory Visit (INDEPENDENT_AMBULATORY_CARE_PROVIDER_SITE_OTHER): Payer: Medicare Other | Admitting: Cardiovascular Disease

## 2010-12-08 DIAGNOSIS — R079 Chest pain, unspecified: Secondary | ICD-10-CM

## 2010-12-08 DIAGNOSIS — I2510 Atherosclerotic heart disease of native coronary artery without angina pectoris: Secondary | ICD-10-CM

## 2010-12-08 DIAGNOSIS — I10 Essential (primary) hypertension: Secondary | ICD-10-CM

## 2010-12-08 DIAGNOSIS — E119 Type 2 diabetes mellitus without complications: Secondary | ICD-10-CM

## 2010-12-08 DIAGNOSIS — R0602 Shortness of breath: Secondary | ICD-10-CM

## 2010-12-08 DIAGNOSIS — E785 Hyperlipidemia, unspecified: Secondary | ICD-10-CM

## 2010-12-08 MED ORDER — METOPROLOL TARTRATE 25 MG PO TABS: 25.0000 mg | ORAL_TABLET | Freq: Two times a day (BID) | ORAL | Status: DC

## 2010-12-08 NOTE — Patient Instructions (Signed)
  Please start metoprolol 1/2 tab twice a day Increase to one a day after a week  if blood pressure and heart rate is not too low. Take the ranexa and isosorbide earlier in the AM (could try late at night as well)  Please call us if you have new issues that need to be addressed before your next appt.  The office will contact you for a follow up Appt. In 6 months

## 2010-12-08 NOTE — Telephone Encounter (Signed)
Called pt - LMOM. Will call her back this week.

## 2010-12-09 ENCOUNTER — Encounter: Payer: Self-pay | Admitting: Family Medicine

## 2010-12-09 NOTE — Assessment & Plan Note (Addendum)
Severe CAD, occluded LIMA graft on CTA. After a long discussion with the patient and her daughter, we will manage her medically. We will start metopolol BID 12.5 mg with titration upwards as BP and heart rate tolerate. We have suggested that she take her imdur and ranexa earlier in the AM at 7 Am when she wakes. If she has increasing SOB on metoprolol, we could try bystolic.

## 2010-12-09 NOTE — Assessment & Plan Note (Signed)
Blood pressure is well controlled on today's visit. No changes made to the medications. 

## 2010-12-09 NOTE — Telephone Encounter (Signed)
Pt returned RB's call & can be reached at [phone] (lunch 11-12).  Christine Blanchard

## 2010-12-09 NOTE — Progress Notes (Signed)
Patient ID: Christine Blanchard, female    DOB: [birth year], 77 y.o.   MRN: [id number]  HPI Comments: Ms. Patricelli is a very pleasant 77 year old woman with history of coronary artery disease, bypass surgery in 2004, hyperlipidemia, PCI 6 months after her bypass, repeat PCI one year later (She does report having a stroke after her cardiac catheterization), diabetes, hypertension, spinal stenosis who currently lives in a nursing home in [place], who has chronic unsteady gait  with worsening of her shortness of breath over the past year and now on chronic oxygen who presents for routine followup.  On her last visit, she reported having increasing  short of breath  with exertion.  she reported feeling weak which was similar symptoms to when she had a blockage and needed a stent. She also had worsening back pain radiating to her front which was her previous anginal equivalent. She continues to feel weak and SOB but her daughter reports that her arthritis and underlying lung disease slows her more that her heart. Her legs have been very weak and she has found herself needing assistance to walk ("sometimes I am just too weak"). She does have some episodes of chest pain in the AM before taking her AM meds and takes a NTG.   A cardiac CTA was ordered that showed: Patent SVG to D1 with poor distal runoff,  Occluded LIMA to LAD.  Distal LAD never visualized,  RCA and circumflex patient with multiple 50% or less calcfic Lesions,  Poor distal runoff from stented IM/LAD and small D1 supplied by SVG  Echocardiogram performed February 2012 was essentially normal with mild TR, normal right ventricular systolic pressure  Stress test in August 2011 was normal with no ischemia, normal LV function.  EKG shows Normal sinus rhythm with rate 73 beats per minute with poor R-wave progression through the anterior precordial leads, nonspecific T wave abnormality   Outpatient Encounter Prescriptions as of 12/08/2010  Medication Sig  Dispense Refill  . albuterol (PROVENTIL,VENTOLIN) 90 MCG/ACT inhaler Inhale 2 puffs into the lungs every 6 (six) hours as needed for wheezing.  3 each  3  . aspirin 325 MG tablet Take 325 mg by mouth daily.        Marland Kitchen atorvastatin (LIPITOR) 20 MG tablet Take 20 mg by mouth Nightly.       . benzonatate (TESSALON) 100 MG capsule Take 100 mg by mouth 3 (three) times daily as needed.        Jennette Banker Sodium 30-100 MG CAPS as needed.        . clopidogrel (PLAVIX) 75 MG tablet Take 75 mg by mouth daily.        . ergocalciferol (VITAMIN D2) 50000 UNITS capsule Take 50,000 Units by mouth once a week.        . escitalopram (LEXAPRO) 20 MG tablet Take 20 mg by mouth daily.        . eszopiclone (LUNESTA) 1 MG TABS Take 1 mg by mouth at bedtime.       Marland Kitchen ezetimibe (ZETIA) 10 MG tablet Take 10 mg by mouth daily.        . fenofibrate micronized (ANTARA) 130 MG capsule Take 130 mg by mouth daily.        . fluticasone (FLONASE) 50 MCG/ACT nasal spray 2 sprays by Nasal route daily.        . insulin glargine (LANTUS) 100 UNIT/ML injection Inject 20 Units into the skin every morning.       Marland Kitchen  isosorbide mononitrate (IMDUR) 60 MG 24 hr tablet 1 and 1/2 two times a day  270 tablet  3  . loratadine (CLARITIN) 10 MG tablet Take 1 tablet (10 mg total) by mouth daily.  90 tablet  3  . metformin (FORTAMET) 1000 MG (OSM) 24 hr tablet Take 1,000 mg by mouth daily after supper.        . metoprolol (LOPRESSOR) 50 MG tablet Take one tablet evening before procedure and one tablet 1 hour prior to procedure only.  3 tablet  0  . mirtazapine (REMERON) 15 MG tablet Take 15 mg by mouth daily.        . Multiple Vitamin (MULTIVITAMIN) capsule Take 1 capsule by mouth daily.        . nitroGLYCERIN (NITROSTAT) 0.4 MG SL tablet Place 0.4 mg under the tongue as needed.        . prednisoLONE sodium phosphate (INFLAMASE FORTE) 1 % ophthalmic solution Place 1 drop into both eyes 2 (two) times daily.        . RABEprazole (ACIPHEX)  20 MG tablet Take 20 mg by mouth daily.        . ranolazine (RANEXA) 1000 MG SR tablet Take by mouth 2 (two) times daily.       . sitaGLIPtan (JANUVIA) 100 MG tablet Take 100 mg by mouth daily.        . metoprolol tartrate (LOPRESSOR) 25 MG tablet Take 1 tablet (25 mg total) by mouth 2 (two) times daily.  60 tablet  11      Review of Systems  Constitutional: Positive for fatigue. Negative for fever and unexpected weight change.  HENT: Negative.   Eyes: Negative.   Respiratory: Positive for shortness of breath. Negative for apnea, cough, choking, chest tightness, wheezing and stridor.   Cardiovascular: Positive for chest pain.  Gastrointestinal: Negative.   Musculoskeletal: Positive for myalgias and back pain.       Profound leg weakness  Skin: Negative.   Neurological: Negative.   Hematological: Negative.   Psychiatric/Behavioral: Negative.   All other systems reviewed and are negative.    BP 130/62  Pulse 73  Ht 5\' 5"  (1.651 m)  Wt 158 lb (71.668 kg)  BMI 26.29 kg/m2   Physical Exam  Nursing note and vitals reviewed. Constitutional: She is oriented to person, place, and time. She appears well-developed and well-nourished.  HENT:  Head: Normocephalic.  Nose: Nose normal.  Mouth/Throat: Oropharynx is clear and moist.  Eyes: Conjunctivae are normal. Pupils are equal, round, and reactive to light.  Neck: Normal range of motion. Neck supple. No JVD present.  Cardiovascular: Normal rate, regular rhythm, S1 normal, S2 normal and intact distal pulses.  Exam reveals no gallop and no friction rub.   Murmur heard.  Crescendo systolic murmur is present with a grade of 2/6  Pulmonary/Chest: Effort normal. No respiratory distress. She has decreased breath sounds. She has no wheezes. She has no rales. She exhibits no tenderness.  Abdominal: Soft. Bowel sounds are normal. She exhibits no distension. There is no tenderness.  Musculoskeletal: Normal range of motion. She exhibits no edema  and no tenderness.  Lymphadenopathy:    She has no cervical adenopathy.  Neurological: She is alert and oriented to person, place, and time. Coordination normal.  Skin: Skin is warm and dry. No rash noted. No erythema.  Psychiatric: She has a normal mood and affect. Her behavior is normal. Judgment and thought content normal.  Assessment and Plan

## 2010-12-09 NOTE — Assessment & Plan Note (Signed)
Cholesterol is at goal on the current lipid regimen. No changes to the medications were made.  

## 2010-12-09 NOTE — Telephone Encounter (Signed)
Pt aware RB will try to reach her this week. She said that Dr. Lewie Loron was going to speak with RB regarding her test results. Will hold msg in RB box so he may contact the pt.

## 2010-12-09 NOTE — Assessment & Plan Note (Signed)
She has underlying lung disease. SOB likely multifactorial, including underlying CAD, lund disease.. Medical management as above for CAD. Her leg weakness was a limiting factor today and she was a two person assist to get into a wheelchair.  She may benefit from PT and bike exercise as tolerated.

## 2010-12-09 NOTE — Assessment & Plan Note (Signed)
We have encouraged continued  careful diet management   

## 2010-12-11 ENCOUNTER — Telehealth: Payer: Self-pay | Admitting: Emergency Medicine

## 2010-12-11 NOTE — Telephone Encounter (Signed)
See attached note.

## 2010-12-11 NOTE — Telephone Encounter (Signed)
Spoke w patient, see other phone note

## 2010-12-11 NOTE — Telephone Encounter (Signed)
I spoke to pt by phone. Explained to her that I agree that she should and can start the metoprolol. Also confirmed with her that she is on enough oxygen as long as her SpO2 is > 90%. She is considering changing to Dr Kendrick Fries since he is closer in [place]. Thanks for keeping me updated.   Rob B

## 2010-12-11 NOTE — Telephone Encounter (Signed)
Message copied by Leslye Peer on Fri Dec 11, 2010  1:30 PM ------      Message from: Phoebe Sharps      Created: Wed Dec 09, 2010  9:14 PM       After a long discussion with Ms. Kasa, we are going to continue medical management of her CAD. She has an occluded LIMA to the LAD on cardiac CTA.              I was going to start low dose metoprolol tartarte 12.5 and possibly increase to 25 mg BID if tolerated. I wanted to check with you as she does have underlying lung disease.             Thx      Dossie Arbour

## 2010-12-16 ENCOUNTER — Encounter: Payer: Self-pay | Admitting: Family Medicine

## 2010-12-16 ENCOUNTER — Ambulatory Visit (INDEPENDENT_AMBULATORY_CARE_PROVIDER_SITE_OTHER): Payer: Medicare Other | Admitting: Family Medicine

## 2010-12-16 VITALS — BP 130/78 | HR 72 | Temp 97.9°F | Wt 159.2 lb

## 2010-12-16 DIAGNOSIS — R269 Unspecified abnormalities of gait and mobility: Secondary | ICD-10-CM

## 2010-12-16 DIAGNOSIS — R2681 Unsteadiness on feet: Secondary | ICD-10-CM

## 2010-12-16 NOTE — Progress Notes (Signed)
  Subjective:    Patient ID: Christine Blanchard, female    DOB: [birth year], 77 y.o.   MRN: [id number]  HPI CC: trouble walking  Christine Blanchard is a very pleasant 77 year old woman with h/o severe CAD s/p CABG in 2004, HLD, PCI 6 months after her bypass, repeat PCI one year later complicated by CVA with residual R weakness, T2DM, HTN, spinal stenosis who currently lives in independent living at [facility] in [place], also with chronic unsteady gait and worsening dyspnea now on chronic oxygen  H/o CVA after card cath.  S/p 5 caths.  Recently saw cardiology - heart scan showed blockages and small vessels throughout - occluded LIMA to the LAD on cardiac CTA.  Rec against surgery, restenting, even cath given h/o CVA, rec medical management.  Started on metoprolol bid.  Also prior on ranexa, imdur and nitro SL as well as other meds on med list.  Children giving her hard time, don't want her to do any exhertion.  Pt wakes up with chest pain.  Anginal equivalent is back discomfort.  Very limited in activities 2/2 chest and heart.  Has hired extra help - lady comes to house 1/2 day.  Pt currently driving (limits distance to 10mi around house, does not drive on interstate) but wonders if will be able to continue 2/2 cardiac status.  Presents today with concern about walking.  Lives in independent community at [facility].  Does take day trips.  Last day trip, felt legs buckle under her.  Legs feel weak, more recently.  Using loaned walker with seat which helps but finds it too heavy to take with her prolonged period.  For exercise, walks to dining room daily (~900 feet from home).  No leg cramps.  No muscle pains.  No knee pain.  Leg weakness happening 3x/wk.  Denies dizziness, blurred vision, headaches, LOC or presyncope.  dexa 2005 WNL.  Last fall 01/2010.  Review of Systems Per HPI    Objective:   Physical Exam  Nursing note and vitals reviewed. Constitutional: She appears well-developed and well-nourished. No distress.   HENT:  Head: Normocephalic and atraumatic.  Eyes: Conjunctivae and EOM are normal. Pupils are equal, round, and reactive to light. No scleral icterus.  Neck: Carotid bruit is not present.  Cardiovascular: Normal rate, regular rhythm, normal heart sounds and intact distal pulses.   No murmur heard. Pulmonary/Chest: Effort normal and breath sounds normal. No respiratory distress. She has no wheezes. She has no rales.  Musculoskeletal: Normal range of motion. She exhibits no edema.       No calf tenderness  Neurological: She is alert. She has normal strength and normal reflexes. No sensory deficit. Coordination and gait abnormal.  Reflex Scores:      Patellar reflexes are 2+ on the right side and 2+ on the left side.      Achilles reflexes are 2+ on the right side and 2+ on the left side.      Unstable romberg. Needs assistance to stand up. Unstable gait and stance even just with feet together, unable to do tandem stance.  Skin: Skin is warm and dry. No rash noted.  Psychiatric: She has a normal mood and affect.      Assessment & Plan:

## 2010-12-16 NOTE — Patient Instructions (Addendum)
Check with Dr. Mariah Milling about driving. Pass by [facility]'s office to shcedule physical therapy appointment. Good to see you today. Keep appointment in january

## 2010-12-16 NOTE — Assessment & Plan Note (Addendum)
Likely multifactorial, I do feel pt will benefit from ambulatory assistive device, likely rolling walker with seat.  Will refer to PT for further evaluation. pt does meet criteria for homebound status given physical limitation due to CAD and O2 dependence. Asked pt to check with cards re driving. No recent falls but unsteady on feet so is fall risk. DEXA 2005 WNL.  May recheck in next few years. Taking 50k IU weekly vit D, not calcium.

## 2010-12-17 ENCOUNTER — Telehealth: Payer: Self-pay | Admitting: *Deleted

## 2010-12-17 NOTE — Telephone Encounter (Signed)
Pt called today wanting Dr. Windell Hummingbird opinion whether she should be driving. Pt last f/u 11/6 post cardiac CTA showing probable blockage of one of her bypass grafts, which she is going to manage medically. I know per previous conversation with her daughter that daughter is very concerned with her mother driving with her dependence of O2, SOB and difficulty with ambulation. She recently saw pcp, Dr. Renee Ramus and he also referred pt to discuss her driving with cardiologist. Please advise.

## 2010-12-19 DIAGNOSIS — R42 Dizziness and giddiness: Secondary | ICD-10-CM | POA: Diagnosis not present

## 2010-12-19 DIAGNOSIS — I2510 Atherosclerotic heart disease of native coronary artery without angina pectoris: Secondary | ICD-10-CM | POA: Diagnosis not present

## 2010-12-19 DIAGNOSIS — IMO0001 Reserved for inherently not codable concepts without codable children: Secondary | ICD-10-CM | POA: Diagnosis not present

## 2010-12-19 DIAGNOSIS — E119 Type 2 diabetes mellitus without complications: Secondary | ICD-10-CM | POA: Diagnosis not present

## 2010-12-19 DIAGNOSIS — I69993 Ataxia following unspecified cerebrovascular disease: Secondary | ICD-10-CM | POA: Diagnosis not present

## 2010-12-19 DIAGNOSIS — I10 Essential (primary) hypertension: Secondary | ICD-10-CM | POA: Diagnosis not present

## 2010-12-20 NOTE — Telephone Encounter (Signed)
If ok with patient and daughter, would probably refrain from driving.

## 2010-12-21 ENCOUNTER — Telehealth: Payer: Self-pay | Admitting: *Deleted

## 2010-12-21 MED ORDER — METOPROLOL TARTRATE 25 MG PO TABS: 25.0000 mg | ORAL_TABLET | Freq: Two times a day (BID) | ORAL | Status: DC

## 2010-12-21 NOTE — Telephone Encounter (Signed)
Pt notified of below. She requests that we [facility]

## 2010-12-21 NOTE — Telephone Encounter (Signed)
Physical therapist called to let you know that he will be seeing the patient once a week for 2 weeks and then 2 times a week for 5 weeks.

## 2010-12-21 NOTE — Telephone Encounter (Signed)
Attempted to contact pt, LMOM TCB.  

## 2010-12-21 NOTE — Telephone Encounter (Signed)
Noted, agree. Thanks.

## 2010-12-22 DIAGNOSIS — I10 Essential (primary) hypertension: Secondary | ICD-10-CM | POA: Diagnosis not present

## 2010-12-22 DIAGNOSIS — I69993 Ataxia following unspecified cerebrovascular disease: Secondary | ICD-10-CM | POA: Diagnosis not present

## 2010-12-22 DIAGNOSIS — I2510 Atherosclerotic heart disease of native coronary artery without angina pectoris: Secondary | ICD-10-CM | POA: Diagnosis not present

## 2010-12-22 DIAGNOSIS — R42 Dizziness and giddiness: Secondary | ICD-10-CM | POA: Diagnosis not present

## 2010-12-22 DIAGNOSIS — E119 Type 2 diabetes mellitus without complications: Secondary | ICD-10-CM | POA: Diagnosis not present

## 2010-12-22 DIAGNOSIS — IMO0001 Reserved for inherently not codable concepts without codable children: Secondary | ICD-10-CM | POA: Diagnosis not present

## 2010-12-25 ENCOUNTER — Other Ambulatory Visit: Payer: Self-pay

## 2010-12-25 MED ORDER — METOPROLOL TARTRATE 25 MG PO TABS: 25.0000 mg | ORAL_TABLET | Freq: Two times a day (BID) | ORAL | Status: DC

## 2010-12-29 DIAGNOSIS — IMO0001 Reserved for inherently not codable concepts without codable children: Secondary | ICD-10-CM | POA: Diagnosis not present

## 2010-12-29 DIAGNOSIS — I10 Essential (primary) hypertension: Secondary | ICD-10-CM | POA: Diagnosis not present

## 2010-12-29 DIAGNOSIS — I69993 Ataxia following unspecified cerebrovascular disease: Secondary | ICD-10-CM | POA: Diagnosis not present

## 2010-12-29 DIAGNOSIS — I2510 Atherosclerotic heart disease of native coronary artery without angina pectoris: Secondary | ICD-10-CM | POA: Diagnosis not present

## 2010-12-29 DIAGNOSIS — R42 Dizziness and giddiness: Secondary | ICD-10-CM | POA: Diagnosis not present

## 2010-12-29 DIAGNOSIS — E119 Type 2 diabetes mellitus without complications: Secondary | ICD-10-CM | POA: Diagnosis not present

## 2010-12-31 DIAGNOSIS — I10 Essential (primary) hypertension: Secondary | ICD-10-CM | POA: Diagnosis not present

## 2010-12-31 DIAGNOSIS — E119 Type 2 diabetes mellitus without complications: Secondary | ICD-10-CM | POA: Diagnosis not present

## 2010-12-31 DIAGNOSIS — I69993 Ataxia following unspecified cerebrovascular disease: Secondary | ICD-10-CM | POA: Diagnosis not present

## 2010-12-31 DIAGNOSIS — R42 Dizziness and giddiness: Secondary | ICD-10-CM | POA: Diagnosis not present

## 2010-12-31 DIAGNOSIS — I2510 Atherosclerotic heart disease of native coronary artery without angina pectoris: Secondary | ICD-10-CM | POA: Diagnosis not present

## 2010-12-31 DIAGNOSIS — IMO0001 Reserved for inherently not codable concepts without codable children: Secondary | ICD-10-CM | POA: Diagnosis not present

## 2011-01-05 DIAGNOSIS — I2510 Atherosclerotic heart disease of native coronary artery without angina pectoris: Secondary | ICD-10-CM | POA: Diagnosis not present

## 2011-01-05 DIAGNOSIS — R42 Dizziness and giddiness: Secondary | ICD-10-CM | POA: Diagnosis not present

## 2011-01-05 DIAGNOSIS — IMO0001 Reserved for inherently not codable concepts without codable children: Secondary | ICD-10-CM | POA: Diagnosis not present

## 2011-01-05 DIAGNOSIS — I69993 Ataxia following unspecified cerebrovascular disease: Secondary | ICD-10-CM | POA: Diagnosis not present

## 2011-01-05 DIAGNOSIS — I10 Essential (primary) hypertension: Secondary | ICD-10-CM | POA: Diagnosis not present

## 2011-01-05 DIAGNOSIS — E119 Type 2 diabetes mellitus without complications: Secondary | ICD-10-CM | POA: Diagnosis not present

## 2011-01-07 DIAGNOSIS — I10 Essential (primary) hypertension: Secondary | ICD-10-CM | POA: Diagnosis not present

## 2011-01-07 DIAGNOSIS — IMO0001 Reserved for inherently not codable concepts without codable children: Secondary | ICD-10-CM | POA: Diagnosis not present

## 2011-01-07 DIAGNOSIS — I69993 Ataxia following unspecified cerebrovascular disease: Secondary | ICD-10-CM | POA: Diagnosis not present

## 2011-01-07 DIAGNOSIS — I2510 Atherosclerotic heart disease of native coronary artery without angina pectoris: Secondary | ICD-10-CM | POA: Diagnosis not present

## 2011-01-07 DIAGNOSIS — E119 Type 2 diabetes mellitus without complications: Secondary | ICD-10-CM | POA: Diagnosis not present

## 2011-01-07 DIAGNOSIS — R42 Dizziness and giddiness: Secondary | ICD-10-CM | POA: Diagnosis not present

## 2011-01-12 DIAGNOSIS — I69993 Ataxia following unspecified cerebrovascular disease: Secondary | ICD-10-CM | POA: Diagnosis not present

## 2011-01-12 DIAGNOSIS — E119 Type 2 diabetes mellitus without complications: Secondary | ICD-10-CM | POA: Diagnosis not present

## 2011-01-12 DIAGNOSIS — IMO0001 Reserved for inherently not codable concepts without codable children: Secondary | ICD-10-CM | POA: Diagnosis not present

## 2011-01-12 DIAGNOSIS — R42 Dizziness and giddiness: Secondary | ICD-10-CM | POA: Diagnosis not present

## 2011-01-12 DIAGNOSIS — I10 Essential (primary) hypertension: Secondary | ICD-10-CM | POA: Diagnosis not present

## 2011-01-12 DIAGNOSIS — I2510 Atherosclerotic heart disease of native coronary artery without angina pectoris: Secondary | ICD-10-CM | POA: Diagnosis not present

## 2011-01-13 LAB — LIPID PANEL
Cholesterol, Total: 140
Triglycerides: 91

## 2011-01-13 LAB — COMPREHENSIVE METABOLIC PANEL
ALT: 19 U/L (ref 7–35)
Alkaline Phosphatase: 40 U/L
Creat: 0.99
GFR, Est Non African American: 53.97
Glucose: 116
Sodium: 142 mmol/L (ref 137–147)
Total Bilirubin: 0.5 mg/dL

## 2011-01-13 LAB — CBC: MCV: 93.8 fL

## 2011-01-14 ENCOUNTER — Ambulatory Visit (INDEPENDENT_AMBULATORY_CARE_PROVIDER_SITE_OTHER): Payer: Medicare Other | Admitting: Pulmonary Disease

## 2011-01-14 ENCOUNTER — Encounter: Payer: Self-pay | Admitting: Pulmonary Disease

## 2011-01-14 ENCOUNTER — Telehealth: Payer: Self-pay | Admitting: Pulmonary Disease

## 2011-01-14 VITALS — BP 124/76 | HR 66 | Temp 97.6°F | Ht 65.0 in | Wt 160.0 lb

## 2011-01-14 DIAGNOSIS — R42 Dizziness and giddiness: Secondary | ICD-10-CM | POA: Diagnosis not present

## 2011-01-14 DIAGNOSIS — E119 Type 2 diabetes mellitus without complications: Secondary | ICD-10-CM | POA: Diagnosis not present

## 2011-01-14 DIAGNOSIS — M7989 Other specified soft tissue disorders: Secondary | ICD-10-CM

## 2011-01-14 DIAGNOSIS — I10 Essential (primary) hypertension: Secondary | ICD-10-CM | POA: Diagnosis not present

## 2011-01-14 DIAGNOSIS — IMO0001 Reserved for inherently not codable concepts without codable children: Secondary | ICD-10-CM | POA: Diagnosis not present

## 2011-01-14 DIAGNOSIS — I2510 Atherosclerotic heart disease of native coronary artery without angina pectoris: Secondary | ICD-10-CM | POA: Diagnosis not present

## 2011-01-14 DIAGNOSIS — I69993 Ataxia following unspecified cerebrovascular disease: Secondary | ICD-10-CM | POA: Diagnosis not present

## 2011-01-14 DIAGNOSIS — R0600 Dyspnea, unspecified: Secondary | ICD-10-CM

## 2011-01-14 DIAGNOSIS — R1310 Dysphagia, unspecified: Secondary | ICD-10-CM | POA: Insufficient documentation

## 2011-01-14 DIAGNOSIS — R0609 Other forms of dyspnea: Secondary | ICD-10-CM

## 2011-01-14 MED ORDER — FUROSEMIDE 40 MG PO TABS: 40.0000 mg | ORAL_TABLET | Freq: Every day | ORAL | Status: DC

## 2011-01-14 MED ORDER — FUROSEMIDE 40 MG PO TABS: ORAL_TABLET | ORAL | Status: DC

## 2011-01-14 MED ORDER — POTASSIUM CHLORIDE CRYS ER 20 MEQ PO TBCR: EXTENDED_RELEASE_TABLET | ORAL | Status: DC

## 2011-01-14 MED ORDER — POTASSIUM CHLORIDE ER 8 MEQ PO TBCR: 24.0000 meq | EXTENDED_RELEASE_TABLET | Freq: Every day | ORAL | Status: DC

## 2011-01-14 NOTE — Assessment & Plan Note (Signed)
Increased lately.  Though she has normal systolic function, it would not surprise me if she had some degree of diastolic dysfunction.  This could also be just venous insufficiency, but it has increased recently.  I will give her an Rx for lasix with potassium to take for a few days.  I have instructed him to not take it daily and to discuss with Dr. Mariah Milling and Sharen Hones

## 2011-01-14 NOTE — Progress Notes (Signed)
Subjective:    Patient ID: Christine Blanchard, female    DOB: 02-Feb-1928, 77 y.o.   MRN: [id number]  HPI This is a very pleasant 77 y/o female who has followed with Dr. Delton Coombes in the past for shortness of breath.  She notes that she has been short of breath for years, but recently only was evaluated by pulmonary a few years ago.  She notes dyspnea mostly with exertion, minimal wheezing and rare coughing.  She states that allergies in the spring make her breathing worse.  She has tried dulera and albuterol inhalers but these have not helped.  She has chronic angina treated by Dr. Jose Persia.  Recently she started on metoprolol for this in addition to Ranexa and she says that she has had less chest pain and her dyspnea is not any worse.  Prior PFT's have shown both restriction and obstruction.  MIP and MEP have been shown to be low.  She notes that she chokes on food often.  She has noticed increasing swelling in her ankles for the last few weeks.  She states that this is a new problem for her.  Past Medical History  Diagnosis Date  . Hypertension   . Diabetes mellitus 2003    followed by Dr Altheimer  . Allergy   . Hyperlipidemia   . History of chicken pox     has had shingles shot  . History of diverticulitis of colon 1991, 1992, 1994  . History of kidney stones 1952  . CVA (cerebral infarction)     minimal R residual weakness, affects speech when tired  . Urinary incontinence     stress/urge, per D. Tannenbaum/Lomax  . CAD (coronary artery disease)     severe, s/p 4 cardiac caths, minimally invasive CABG @[facility] , ICA stent 2004 then LAD stent 2005, medical management  . Breathing difficulty 2011    on O2 since 10/2009  . Urinary tract infection     last 2011  . Syncope 2005    s/p w/u including CT scan and 24 hour Holter  . GERD (gastroesophageal reflux disease) 2004  . Macular degeneration     s/p corneal implants  . Benign essential tremor     toprol XL and remeron  . Herpes zoster  ophthalmicus 2009    history  . CHF (congestive heart failure)     anticipated diastolic dysfunction  . Hearing loss of both ears 08/2010    audiology eval 08/2010 - mod sensorineural heaing loss, rec trial digital hearing aids and re eval yearly to monitor  . Vitamin D deficiency     on 50k units weekly     Family History  Problem Relation Age of Onset  . Cancer Mother     colon  . Heart disease Father   . Cancer Brother     bladder     History   Social History  . Marital Status: Widowed    Spouse Name: N/A    Number of Children: 2  . Years of Education: college   Occupational History  .     Social History Main Topics  . Smoking status: Never Smoker   . Smokeless tobacco: Never Used  . Alcohol Use: No  . Drug Use: No  . Sexually Active: Not on file   Other Topics Concern  . Not on file   Social History Narrative   Lives alone in independent retirement community, [facility] [place].  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 Actos (Pioglitazone Hydrochloride) Shortness Of Breath    CHF  . Atenolol Other (See Comments)    lethargy  . Byetta Nausea Only  . Codeine     REACTION: vomiting  . Lunesta Other (See Comments)    Nightmares  . Morphine     REACTION: nausea  . Sulfa Antibiotics   . Vicodin (Hydrocodone-Acetaminophen) Nausea And Vomiting     Outpatient Prescriptions Prior to Visit  Medication Sig Dispense Refill  . albuterol (PROVENTIL,VENTOLIN) 90 MCG/ACT inhaler Inhale 2 puffs into the lungs every 6 (six) hours as needed for wheezing.  3 each  3  . aspirin 325 MG tablet Take 325 mg by mouth daily.        Marland Kitchen atorvastatin (LIPITOR) 20 MG tablet Take 20 mg by mouth Nightly.       . benzonatate (TESSALON) 100 MG capsule Take 100 mg by mouth 3 (three) times daily as needed.        Jennette Banker Sodium 30-100 MG CAPS as needed.        . clopidogrel (PLAVIX)  75 MG tablet Take 75 mg by mouth daily.        . ergocalciferol (VITAMIN D2) 50000 UNITS capsule Take 50,000 Units by mouth once a week.        . escitalopram (LEXAPRO) 20 MG tablet Take 20 mg by mouth daily.        Marland Kitchen ezetimibe (ZETIA) 10 MG tablet Take 10 mg by mouth daily.        . fenofibrate micronized (ANTARA) 130 MG capsule Take 130 mg by mouth daily.        . fluticasone (FLONASE) 50 MCG/ACT nasal spray 2 sprays by Nasal route daily.        . insulin glargine (LANTUS) 100 UNIT/ML injection Inject 20 Units into the skin every morning.       . isosorbide mononitrate (IMDUR) 60 MG 24 hr tablet 1 and 1/2 two times a day  270 tablet  3  . loratadine (CLARITIN) 10 MG tablet Take 1 tablet (10 mg total) by mouth daily.  90 tablet  3  . LORazepam (ATIVAN) 0.5 MG tablet Take 0.5 mg by mouth at bedtime as needed.       . metformin (FORTAMET) 1000 MG (OSM) 24 hr tablet Take 1,000 mg by mouth daily after supper.        . metoprolol tartrate (LOPRESSOR) 25 MG tablet Take 1 tablet (25 mg total) by mouth 2 (two) times daily.  180 tablet  3  . mirtazapine (REMERON) 15 MG tablet Take 15 mg by mouth daily.        . Multiple Vitamin (MULTIVITAMIN) capsule Take 1 capsule by mouth daily.        . nitroGLYCERIN (NITROSTAT) 0.4 MG SL tablet Place 0.4 mg under the tongue as needed.        . prednisoLONE sodium phosphate (INFLAMASE FORTE) 1 % ophthalmic solution Place 1 drop into both eyes 2 (two) times daily.        . RABEprazole (ACIPHEX) 20 MG tablet Take 20 mg by mouth daily.        . ranolazine (RANEXA) 1000 MG SR tablet Take by mouth 2 (two) times daily.       . sitaGLIPtan (JANUVIA) 100 MG tablet Take 100 mg by mouth daily.           Review of Systems  Constitutional: Negative for fever, chills and unexpected weight change.  HENT: Negative for ear pain, nosebleeds, congestion, sore throat, rhinorrhea, sneezing, trouble swallowing, dental problem, voice change, postnasal drip and sinus pressure.   Eyes:  Negative for visual disturbance.  Respiratory: Positive for shortness of breath. Negative for cough and choking.   Cardiovascular: Positive for leg swelling. Negative for chest pain.  Gastrointestinal: Negative for vomiting, abdominal pain and diarrhea.  Genitourinary: Negative for difficulty urinating.  Musculoskeletal: Negative for arthralgias.  Skin: Negative for rash.  Neurological: Negative for tremors, syncope and headaches.  Hematological: Does not bruise/bleed easily.       Objective:   Physical Exam  Filed Vitals:   01/14/11 1418  BP: 124/76  Pulse: 66  Temp: 97.6 F (36.4 C)  TempSrc: Oral  Height: 5\' 5"  (1.651 m)  Weight: 160 lb (72.576 kg)  SpO2: 98%    Gen: well appearing, no acute distress HEENT: NCAT, PERRL, EOMi, OP clear, Neck: supple without masses PULM: Few scattered insp wheezes, insp crackles R base CV: RRR, slight systolic murmur, no JVD AB: BS+, soft, nontender, no hsm Ext: warm, no edema, no clubbing, no cyanosis Derm: no rash or skin breakdown Neuro: A&Ox4, CN II-XII intact, strength  Left 4+/5 in grip strength and flexion at elevated, also 4+/5 hip flexion.    Right 4/5 in grip strength and flexion at elevated, also 4/5 hip flexion.  2011 PFT's: FVC Value: 1.56 L/min Pred: 2.64 L/min % Pred: 59 %  FEV1 Value: 1.28 L Pred: 1.96 L % Pred: 65 %  FEV1/FVC Value: 82 % Pred: 74 % % Pred: 111 %  FEF 25-75 Value: 1.33 L/min Pred: 1.44 L/min % Pred: 92 %  MIP (- ) 40% pred, MEP 46% pred  11/2010 CTA cardiac (with lung windows, my read): R base peripheral/subpleural scarring, scattered GGO greatest in left lower     Assessment & Plan:   Dyspnea I think that Ms. Baca's dyspnea is multifactorial.  Objectively she is hypoxemic with exertion, has restriction and obstruction, has significant coronary disease with chronic angina, has mild scarring and scattered ground glass opacities on a recent CT chest, chokes on food, and has evidence of  diaphragm weakness on prior MIP and MEP testing.  I explained to her today that with all this going on there is no mystery as to why she is short of breath.  Further, I think that the hypoxemia is likely a direct consequence of all of the above.    I do think that her muscle weakness is the most unexplained piece of the puzzle at this point.  She is weak on physical exam and the decreased MIP and MEP are concerning.  She has seen a neurologist for this and I would like to review those records.  She may need further investigation.  DDx of weakness would include spinal stenosis vs. Neuropathy vs. Myopathy.  I think that we can help her today with treatment of her increasing leg edema as well as dysphagia and likely aspiration.  See below.  I see no reason to add more inhalers or other medications to her regimen otherwise.  I agree with pulmonary rehab.  Leg swelling Increased lately.  Though she has normal systolic function, it would not surprise me if she had some degree of diastolic dysfunction.  This could also be just venous insufficiency, but it has increased recently.  I will give her an Rx for lasix with potassium to take for a few days.  I  have instructed him to not take it daily and to discuss with Dr. Mariah Milling and Sharen Hones  Dysphagia She notes frequent dysphagia, has signs of possible aspiration on the recent CT angio of the heart (my read).    -send for modified barium swallow and speech eval    Updated Medication List Outpatient Encounter Prescriptions as of 01/14/2011  Medication Sig Dispense Refill  . albuterol (PROVENTIL,VENTOLIN) 90 MCG/ACT inhaler Inhale 2 puffs into the lungs every 6 (six) hours as needed for wheezing.  3 each  3  . aspirin 325 MG tablet Take 325 mg by mouth daily.        Marland Kitchen atorvastatin (LIPITOR) 20 MG tablet Take 20 mg by mouth Nightly.       . benzonatate (TESSALON) 100 MG capsule Take 100 mg by mouth 3 (three) times daily as needed.        Jennette Banker Sodium 30-100 MG CAPS as needed.        . clopidogrel (PLAVIX) 75 MG tablet Take 75 mg by mouth daily.        . ergocalciferol (VITAMIN D2) 50000 UNITS capsule Take 50,000 Units by mouth once a week.        . escitalopram (LEXAPRO) 20 MG tablet Take 20 mg by mouth daily.        Marland Kitchen ezetimibe (ZETIA) 10 MG tablet Take 10 mg by mouth daily.        . fenofibrate micronized (ANTARA) 130 MG capsule Take 130 mg by mouth daily.        . fluticasone (FLONASE) 50 MCG/ACT nasal spray 2 sprays by Nasal route daily.        . hyoscyamine (LEVSINEX) 0.375 MG 12 hr capsule Take 0.375 mg by mouth 2 (two) times daily as needed.        . insulin glargine (LANTUS) 100 UNIT/ML injection Inject 20 Units into the skin every morning.       . isosorbide mononitrate (IMDUR) 60 MG 24 hr tablet 1 and 1/2 two times a day  270 tablet  3  . loratadine (CLARITIN) 10 MG tablet Take 1 tablet (10 mg total) by mouth daily.  90 tablet  3  . LORazepam (ATIVAN) 0.5 MG tablet Take 0.5 mg by mouth at bedtime as needed.       . metformin (FORTAMET) 1000 MG (OSM) 24 hr tablet Take 1,000 mg by mouth daily after supper.        . metoprolol tartrate (LOPRESSOR) 25 MG tablet Take 1 tablet (25 mg total) by mouth 2 (two) times daily.  180 tablet  3  . mirtazapine (REMERON) 15 MG tablet Take 15 mg by mouth daily.        . Multiple Vitamin (MULTIVITAMIN) capsule Take 1 capsule by mouth daily.        . nitroGLYCERIN (NITROSTAT) 0.4 MG SL tablet Place 0.4 mg under the tongue as needed.        . prednisoLONE sodium phosphate (INFLAMASE FORTE) 1 % ophthalmic solution Place 1 drop into both eyes 2 (two) times daily.        . RABEprazole (ACIPHEX) 20 MG tablet Take 20 mg by mouth daily.        . ranolazine (RANEXA) 1000 MG SR tablet Take by mouth 2 (two) times daily.       . sitaGLIPtan (JANUVIA) 100 MG tablet Take 100 mg by mouth daily.        . furosemide (LASIX) 40 MG tablet Take 1 tablet (  40 mg total) by mouth daily.  30  tablet  0  . potassium chloride (KLOR-CON) 8 MEQ tablet Take 3 tablets (24 mEq total) by mouth daily.  30 tablet  0

## 2011-01-14 NOTE — Telephone Encounter (Signed)
rxs were sent to pharmacy

## 2011-01-14 NOTE — Assessment & Plan Note (Signed)
She notes frequent dysphagia, has signs of possible aspiration on the recent CT angio of the heart (my read).    -send for modified barium swallow and speech eval

## 2011-01-14 NOTE — Patient Instructions (Signed)
Take the lasix 40mg  daily with 20 mEq potassium for the next 3-4 days or until your ankle swelling improves.  Then stop taking it until you can follow up with Dr. Sharen Hones or Dr. Mariah Milling  We will get records from Dr. Loraine Leriche Roy's office (neurology)  We will refer you for a swallowing test and a speech therapist for evaluation of your swallowing.  Continue participating in physical therapy.

## 2011-01-14 NOTE — Assessment & Plan Note (Signed)
I think that Ms. Latulippe's dyspnea is multifactorial.  Objectively she is hypoxemic with exertion, has restriction and obstruction, has significant coronary disease with chronic angina, has mild scarring and scattered ground glass opacities on a recent CT chest, chokes on food, and has evidence of diaphragm weakness on prior MIP and MEP testing.  I explained to her today that with all this going on there is no mystery as to why she is short of breath.  Further, I think that the hypoxemia is likely a direct consequence of all of the above.    I do think that her muscle weakness is the most unexplained piece of the puzzle at this point.  She is weak on physical exam and the decreased MIP and MEP are concerning.  She has seen a neurologist for this and I would like to review those records.  She may need further investigation.  DDx of weakness would include spinal stenosis vs. Neuropathy vs. Myopathy.  I think that we can help her today with treatment of her increasing leg edema as well as dysphagia and likely aspiration.  See below.  I see no reason to add more inhalers or other medications to her regimen otherwise.  I agree with pulmonary rehab.

## 2011-01-15 ENCOUNTER — Telehealth: Payer: Self-pay | Admitting: Internal Medicine

## 2011-01-15 NOTE — Telephone Encounter (Signed)
Christine Blanchard her physical therapist called and stated her BP was 190/90 last night at 7 the patient felt fine and had no other symptoms but wanted you to know.

## 2011-01-17 NOTE — Telephone Encounter (Signed)
Noted.  Can we call for an update on how blood pressures have been running?  In office they have always been adequate (<140/90).  If she is not checking, ask her to start monitoring if able.

## 2011-01-18 NOTE — Telephone Encounter (Signed)
Spoke with patient. She has been monitoring BP all weekend and it has been < 130/80. I advised to continue monitoring it and if it is consistently higher than 140/90 to call me and let me know. Also advised that if at anytime she experiences HA,blurry vision,weakness or slurred speech to call 911. She verbalized understanding.

## 2011-01-20 ENCOUNTER — Telehealth: Payer: Self-pay | Admitting: Internal Medicine

## 2011-01-20 DIAGNOSIS — I2510 Atherosclerotic heart disease of native coronary artery without angina pectoris: Secondary | ICD-10-CM | POA: Diagnosis not present

## 2011-01-20 DIAGNOSIS — E119 Type 2 diabetes mellitus without complications: Secondary | ICD-10-CM | POA: Diagnosis not present

## 2011-01-20 DIAGNOSIS — R42 Dizziness and giddiness: Secondary | ICD-10-CM | POA: Diagnosis not present

## 2011-01-20 DIAGNOSIS — I69993 Ataxia following unspecified cerebrovascular disease: Secondary | ICD-10-CM | POA: Diagnosis not present

## 2011-01-20 DIAGNOSIS — IMO0001 Reserved for inherently not codable concepts without codable children: Secondary | ICD-10-CM | POA: Diagnosis not present

## 2011-01-20 DIAGNOSIS — I10 Essential (primary) hypertension: Secondary | ICD-10-CM | POA: Diagnosis not present

## 2011-01-20 NOTE — Telephone Encounter (Signed)
Thanks. Can we call and see what DPTV is??

## 2011-01-20 NOTE — Telephone Encounter (Signed)
Spoke with Trey Paula. He said BPPV, not DPTV. He did Epley's maneuvers on her and she responded very well. He is going to follow up with her tomorrow to make sure she is still improving.

## 2011-01-20 NOTE — Telephone Encounter (Signed)
Trey Paula the physical therapist call and stated patient's blood pressure was up last week but today when he first arrived it was 150/80 and after treatment and she rested her BP was 120/60.  She had sign and symptoms consistent with DPTV and after the treatment she responded very well.  Call with any questions.  Trey Paula [phone]

## 2011-01-21 DIAGNOSIS — I10 Essential (primary) hypertension: Secondary | ICD-10-CM | POA: Diagnosis not present

## 2011-01-21 DIAGNOSIS — R42 Dizziness and giddiness: Secondary | ICD-10-CM | POA: Diagnosis not present

## 2011-01-21 DIAGNOSIS — I69993 Ataxia following unspecified cerebrovascular disease: Secondary | ICD-10-CM | POA: Diagnosis not present

## 2011-01-21 DIAGNOSIS — I2510 Atherosclerotic heart disease of native coronary artery without angina pectoris: Secondary | ICD-10-CM | POA: Diagnosis not present

## 2011-01-21 DIAGNOSIS — E119 Type 2 diabetes mellitus without complications: Secondary | ICD-10-CM | POA: Diagnosis not present

## 2011-01-21 DIAGNOSIS — IMO0001 Reserved for inherently not codable concepts without codable children: Secondary | ICD-10-CM | POA: Diagnosis not present

## 2011-01-25 DIAGNOSIS — I69993 Ataxia following unspecified cerebrovascular disease: Secondary | ICD-10-CM | POA: Diagnosis not present

## 2011-01-25 DIAGNOSIS — R42 Dizziness and giddiness: Secondary | ICD-10-CM | POA: Diagnosis not present

## 2011-01-25 DIAGNOSIS — I2510 Atherosclerotic heart disease of native coronary artery without angina pectoris: Secondary | ICD-10-CM | POA: Diagnosis not present

## 2011-01-25 DIAGNOSIS — E119 Type 2 diabetes mellitus without complications: Secondary | ICD-10-CM | POA: Diagnosis not present

## 2011-01-25 DIAGNOSIS — I10 Essential (primary) hypertension: Secondary | ICD-10-CM | POA: Diagnosis not present

## 2011-01-25 DIAGNOSIS — IMO0001 Reserved for inherently not codable concepts without codable children: Secondary | ICD-10-CM | POA: Diagnosis not present

## 2011-01-28 DIAGNOSIS — I2510 Atherosclerotic heart disease of native coronary artery without angina pectoris: Secondary | ICD-10-CM | POA: Diagnosis not present

## 2011-01-28 DIAGNOSIS — I69993 Ataxia following unspecified cerebrovascular disease: Secondary | ICD-10-CM | POA: Diagnosis not present

## 2011-01-28 DIAGNOSIS — IMO0001 Reserved for inherently not codable concepts without codable children: Secondary | ICD-10-CM | POA: Diagnosis not present

## 2011-01-28 DIAGNOSIS — E119 Type 2 diabetes mellitus without complications: Secondary | ICD-10-CM | POA: Diagnosis not present

## 2011-01-28 DIAGNOSIS — R42 Dizziness and giddiness: Secondary | ICD-10-CM | POA: Diagnosis not present

## 2011-01-28 DIAGNOSIS — I10 Essential (primary) hypertension: Secondary | ICD-10-CM | POA: Diagnosis not present

## 2011-02-03 ENCOUNTER — Ambulatory Visit: Payer: Self-pay | Admitting: Pulmonary Disease

## 2011-02-03 DIAGNOSIS — E119 Type 2 diabetes mellitus without complications: Secondary | ICD-10-CM | POA: Diagnosis not present

## 2011-02-03 DIAGNOSIS — I69993 Ataxia following unspecified cerebrovascular disease: Secondary | ICD-10-CM | POA: Diagnosis not present

## 2011-02-03 DIAGNOSIS — R42 Dizziness and giddiness: Secondary | ICD-10-CM | POA: Diagnosis not present

## 2011-02-03 DIAGNOSIS — I10 Essential (primary) hypertension: Secondary | ICD-10-CM | POA: Diagnosis not present

## 2011-02-03 DIAGNOSIS — R1310 Dysphagia, unspecified: Secondary | ICD-10-CM | POA: Diagnosis not present

## 2011-02-03 DIAGNOSIS — I2510 Atherosclerotic heart disease of native coronary artery without angina pectoris: Secondary | ICD-10-CM | POA: Diagnosis not present

## 2011-02-03 DIAGNOSIS — IMO0001 Reserved for inherently not codable concepts without codable children: Secondary | ICD-10-CM | POA: Diagnosis not present

## 2011-02-04 ENCOUNTER — Encounter: Payer: Self-pay | Admitting: Family Medicine

## 2011-02-04 ENCOUNTER — Ambulatory Visit (INDEPENDENT_AMBULATORY_CARE_PROVIDER_SITE_OTHER): Payer: Medicare Other | Admitting: Family Medicine

## 2011-02-04 DIAGNOSIS — F329 Major depressive disorder, single episode, unspecified: Secondary | ICD-10-CM | POA: Diagnosis not present

## 2011-02-04 DIAGNOSIS — E119 Type 2 diabetes mellitus without complications: Secondary | ICD-10-CM | POA: Diagnosis not present

## 2011-02-04 DIAGNOSIS — I10 Essential (primary) hypertension: Secondary | ICD-10-CM

## 2011-02-04 DIAGNOSIS — I2510 Atherosclerotic heart disease of native coronary artery without angina pectoris: Secondary | ICD-10-CM | POA: Diagnosis not present

## 2011-02-04 DIAGNOSIS — G250 Essential tremor: Secondary | ICD-10-CM

## 2011-02-04 DIAGNOSIS — F3289 Other specified depressive episodes: Secondary | ICD-10-CM

## 2011-02-04 DIAGNOSIS — R1310 Dysphagia, unspecified: Secondary | ICD-10-CM

## 2011-02-04 MED ORDER — ESCITALOPRAM OXALATE 10 MG PO TABS: 10.0000 mg | ORAL_TABLET | Freq: Every day | ORAL | Status: DC

## 2011-02-04 NOTE — Patient Instructions (Addendum)
decrease lexapro to 10 mg daily Return to see me in 3 months, come in prior fasting for blood work. Check at home what potassium you take and call me. Good to see you today, call us with questions.

## 2011-02-04 NOTE — Progress Notes (Signed)
  Subjective:    Patient ID: Christine Blanchard, female    DOB: [birth year], 78 y.o.   MRN: [id number]  HPI CC: 6 mo f/u  Christine Blanchard is a very pleasant 78 year old woman with h/o severe CAD s/p CABG in 2004 rec medical management only, HLD, PCI 6 months after her bypass, repeat PCI one year later complicated by CVA with residual R weakness, T2DM, HTN, spinal stenosis who currently lives in independent living at [facility] in [place], also with chronic unsteady gait and worsening dyspnea now on chronic oxygen  Seen here 08/04/2010 for medicare wellness visit.  See note for further info.  Formal hearing eval - saw audiologist in [place], told had some hearing loss, but decided against using hearing aides (2/2 other issues that came up last few months).  Seen afterwads 12/2010 with gait imbalance - sent to physical therapy, who thought BPPV going on.  Treated with epley maneuver which has helped.  They recommended pt continue to use rolling walker with seat.  Walks daily to lunch about 630ft, sometimes finds needs to sit down to rest.  Dyspnea, chest discomfort varies.  Some days great, some days needs several nitro pills to help control.  Told doesn't need to call 911 when multiple used as not candidate for further interventions.  HTN - No HA, vision changes, leg swelling. Reports compliance with meds.  Did have one episode a few weeks back with elevated bp to 190/90s, has not happened since.  No change in stress at that time or salt intake.  Unsure why isolated elevated BP.  Followed by Dr. Leslie Dales for DM - has been stable for 9 mo.  Last A1c was 5.8%.  Asks if I can start following diabetes.  Vision screen - last done 08/2010.  Has f/u appt scheduled for 02/16/2011, seen every 6 mo. Lab Results  Component Value Date   HGBA1C 6.7* 11/03/2009   Had swallow function - normal.   Depression - Has been on lexapro 20mg  daily for 8 years.  Mood feels fine.  Denies sadness, depression.  Wold like to decrease dose.  Endo  has been hesitant to take off antidepressant.  Also on remeron nightly, but this is for essential tremor.  Pt brings me copy of blood work from Dr. Leslie Dales, reviewed in detail with her.  Review of Systems Per HPI    Objective:   Physical Exam  Nursing note and vitals reviewed. Constitutional: She appears well-developed and well-nourished. No distress.       On 2L O2 by [place]  HENT:  Head: Normocephalic and atraumatic.  Right Ear: External ear normal.  Left Ear: External ear normal.  Nose: Nose normal.  Mouth/Throat: Oropharynx is clear and moist. No oropharyngeal exudate.  Eyes: Conjunctivae and EOM are normal. Pupils are equal, round, and reactive to light. No scleral icterus.  Neck: Normal range of motion. Neck supple.  Cardiovascular: Normal rate, regular rhythm, normal heart sounds and intact distal pulses.   No murmur heard. Pulmonary/Chest: Effort normal and breath sounds normal. No respiratory distress. She has no wheezes. She has no rales.  Musculoskeletal: She exhibits no edema.  Lymphadenopathy:    She has no cervical adenopathy.  Skin: Skin is warm and dry. No rash noted.  Psychiatric: She has a normal mood and affect.       Assessment & Plan:

## 2011-02-05 ENCOUNTER — Encounter: Payer: Self-pay | Admitting: Family Medicine

## 2011-02-05 ENCOUNTER — Telehealth: Payer: Self-pay | Admitting: Internal Medicine

## 2011-02-05 DIAGNOSIS — F331 Major depressive disorder, recurrent, moderate: Secondary | ICD-10-CM | POA: Insufficient documentation

## 2011-02-05 DIAGNOSIS — G250 Essential tremor: Secondary | ICD-10-CM | POA: Insufficient documentation

## 2011-02-05 NOTE — Telephone Encounter (Signed)
Patient called and stated she is taking Klor-Con 20mg  when she takes the furosemide.

## 2011-02-05 NOTE — Assessment & Plan Note (Signed)
Chronic, severe CAD, continue medical management with close f/u.

## 2011-02-05 NOTE — Assessment & Plan Note (Signed)
No evidence of aspiration on recent MBS/speech eval.

## 2011-02-05 NOTE — Assessment & Plan Note (Addendum)
On lexapro for 8 yrs. Given recent events, I do think beneficial to keep pt on antidepressant (also on remeron nightly but for essential tremor) As doing so well from mood standpoint, and given pt wants to come off med, will decrease lexapro to 10mg  daily and reassess in several months.

## 2011-02-05 NOTE — Assessment & Plan Note (Signed)
BP Readings from Last 3 Encounters:  02/04/11 138/74  01/14/11 124/76  12/16/10 130/78  Systolic slightly elevated given hx DM, isolated elevated HTN to 190 systolic a few weeks back but overall has been stable. Continue current treatment.

## 2011-02-05 NOTE — Assessment & Plan Note (Addendum)
Chronic, stable. Asked her to check with Dr. Leslie Dales about having me follow DM.  Notified I feel comfortable doing this.  It would be much more convenient for patient.  Will ask to input labs into our chart. Has been followed q3 mo by endo in past.

## 2011-02-07 NOTE — Telephone Encounter (Signed)
Noted.  Changed in med list.

## 2011-02-09 ENCOUNTER — Encounter: Payer: Self-pay | Admitting: Family Medicine

## 2011-02-11 ENCOUNTER — Telehealth: Payer: Self-pay | Admitting: *Deleted

## 2011-02-11 DIAGNOSIS — I10 Essential (primary) hypertension: Secondary | ICD-10-CM | POA: Diagnosis not present

## 2011-02-11 DIAGNOSIS — E119 Type 2 diabetes mellitus without complications: Secondary | ICD-10-CM | POA: Diagnosis not present

## 2011-02-11 DIAGNOSIS — I2510 Atherosclerotic heart disease of native coronary artery without angina pectoris: Secondary | ICD-10-CM | POA: Diagnosis not present

## 2011-02-11 DIAGNOSIS — IMO0001 Reserved for inherently not codable concepts without codable children: Secondary | ICD-10-CM | POA: Diagnosis not present

## 2011-02-11 DIAGNOSIS — I69993 Ataxia following unspecified cerebrovascular disease: Secondary | ICD-10-CM | POA: Diagnosis not present

## 2011-02-11 DIAGNOSIS — R42 Dizziness and giddiness: Secondary | ICD-10-CM | POA: Diagnosis not present

## 2011-02-11 NOTE — Telephone Encounter (Signed)
Message copied by Christen Butter on Thu Feb 11, 2011  2:11 PM ------      Message from: Veto Kemps B      Created: Thu Feb 11, 2011  1:35 PM       Can you let Ms. Hott know that her Modified Barium Swallow was normal? thanks

## 2011-02-11 NOTE — Telephone Encounter (Signed)
Spoke with pt and notified her swallow test was normal. Pt verbalized understanding. She is asking when we needs to see her back here in clinic. I do not see where this was discussed at last ov on 12.13.12. Please advise when you would like to see her back. Thanks!

## 2011-02-12 ENCOUNTER — Telehealth: Payer: Self-pay | Admitting: Internal Medicine

## 2011-02-12 ENCOUNTER — Telehealth: Payer: Self-pay

## 2011-02-12 MED ORDER — METOPROLOL TARTRATE 25 MG PO TABS: 25.0000 mg | ORAL_TABLET | Freq: Two times a day (BID) | ORAL | Status: DC

## 2011-02-12 NOTE — Telephone Encounter (Signed)
Noted. Thanks.

## 2011-02-12 NOTE — Telephone Encounter (Signed)
Refill sent for metoprolol tart 25 mg take one tablet twice a day.  

## 2011-02-12 NOTE — Telephone Encounter (Signed)
Let's bring her back in February.  Thanks.

## 2011-02-12 NOTE — Telephone Encounter (Signed)
Iona Hansen PT from [facility] called and stated he discharged patient yesterday.  If you have any questions you can call him at:   [phone]

## 2011-02-18 ENCOUNTER — Telehealth: Payer: Self-pay | Admitting: Allergy

## 2011-02-18 DIAGNOSIS — Z961 Presence of intraocular lens: Secondary | ICD-10-CM | POA: Diagnosis not present

## 2011-02-18 DIAGNOSIS — H3530 Unspecified macular degeneration: Secondary | ICD-10-CM | POA: Diagnosis not present

## 2011-02-18 DIAGNOSIS — H18519 Endothelial corneal dystrophy, unspecified eye: Secondary | ICD-10-CM | POA: Diagnosis not present

## 2011-02-18 DIAGNOSIS — Z947 Corneal transplant status: Secondary | ICD-10-CM | POA: Diagnosis not present

## 2011-02-18 NOTE — Telephone Encounter (Signed)
[facility] REQUESTING RX FOR  LASIX 40 MG  TAKE 1 TABLET FOR NEXT 3 DAYS THEN 1 TABLET AS NEEDED FOR LEG SWELLING. #90 X4 Allergies  Allergen Reactions  . Actos (Pioglitazone Hydrochloride) Shortness Of Breath    CHF  . Atenolol Other (See Comments)    lethargy  . Byetta Nausea Only  . Codeine     REACTION: vomiting  . Lunesta Other (See Comments)    Nightmares  . Morphine     REACTION: nausea  . Sulfa Antibiotics   . Vicodin (Hydrocodone-Acetaminophen) Nausea And Vomiting    Dr Kendrick Fries is this ok to fill . Thank you

## 2011-02-19 ENCOUNTER — Telehealth: Payer: Self-pay

## 2011-02-19 MED ORDER — METOPROLOL TARTRATE 25 MG PO TABS: 25.0000 mg | ORAL_TABLET | Freq: Two times a day (BID) | ORAL | Status: DC

## 2011-02-19 NOTE — Telephone Encounter (Signed)
Refill sent for metoprolol.  

## 2011-02-19 NOTE — Telephone Encounter (Signed)
We should not refill that order.  I only gave her an Rx for this for a few days and I haven't seen her back to see how it is working.  I asked her to discuss with either PCP or cardiology and I don't know the results of those conversations.

## 2011-02-22 NOTE — Telephone Encounter (Signed)
sPOKE WITH [facility] AND ADVISED THEM THAT DR MCQUAID WOULD NOT FILL THIS THAT SHE NEEDED TO DISCUSS WITH PCP OR CARDIOLOGY

## 2011-02-23 ENCOUNTER — Other Ambulatory Visit: Payer: Self-pay | Admitting: *Deleted

## 2011-02-23 MED ORDER — POTASSIUM CHLORIDE CRYS ER 20 MEQ PO TBCR: EXTENDED_RELEASE_TABLET | ORAL | Status: DC

## 2011-02-25 ENCOUNTER — Telehealth: Payer: Self-pay | Admitting: Pulmonary Disease

## 2011-02-25 DIAGNOSIS — M7989 Other specified soft tissue disorders: Secondary | ICD-10-CM

## 2011-02-25 MED ORDER — POTASSIUM CHLORIDE CRYS ER 20 MEQ PO TBCR: EXTENDED_RELEASE_TABLET | ORAL | Status: DC

## 2011-02-25 MED ORDER — FUROSEMIDE 40 MG PO TABS: ORAL_TABLET | ORAL | Status: DC

## 2011-02-25 NOTE — Telephone Encounter (Signed)
I spoke with [facility] and gave verbal order for pt's lasix and klor-con since [facility] has filled this for pt before. Nothing further was needed

## 2011-03-10 ENCOUNTER — Encounter: Payer: Self-pay | Admitting: Pulmonary Disease

## 2011-03-10 ENCOUNTER — Ambulatory Visit (INDEPENDENT_AMBULATORY_CARE_PROVIDER_SITE_OTHER): Payer: Medicare Other | Admitting: Pulmonary Disease

## 2011-03-10 DIAGNOSIS — J9611 Chronic respiratory failure with hypoxia: Secondary | ICD-10-CM | POA: Insufficient documentation

## 2011-03-10 DIAGNOSIS — R1310 Dysphagia, unspecified: Secondary | ICD-10-CM

## 2011-03-10 DIAGNOSIS — J9610 Chronic respiratory failure, unspecified whether with hypoxia or hypercapnia: Secondary | ICD-10-CM

## 2011-03-10 DIAGNOSIS — R0602 Shortness of breath: Secondary | ICD-10-CM

## 2011-03-10 MED ORDER — LORATADINE 10 MG PO TABS: 10.0000 mg | ORAL_TABLET | Freq: Every day | ORAL | Status: DC

## 2011-03-10 NOTE — Assessment & Plan Note (Signed)
Continue using continuous O2 at 2L/min.

## 2011-03-10 NOTE — Patient Instructions (Signed)
We will refer you to pulmonary rehab if it is OK with Dr. Mariah Milling Continue taking your medications as prescribed. We will see you back in 3 months

## 2011-03-10 NOTE — Progress Notes (Signed)
Subjective:    Patient ID: Christine Blanchard, female    DOB: [birth year], 78 y.o.   MRN: [id number]  Synopsis: 78 y/o female with coronary artery disease and chronic angina who has been followed by [facility] in [facility] (by Dr. Delton Coombes) for years and [facility] in [place] since 02/2011 for chronic dyspnea and hypoxemic respiratory failure.  Objectively she has both restrictive and obstructive disease and low MIP and MEP.   HPI 03/10/11 ROV -- [name] reports to me that she is doing "great". She states that she just finished two months of physical therapy and feels better after doing so.  Her shortness of breath has been manageable lately.  She still notices that she chokes on some food, specifically "crumbly" foods like popcorn and cornbread.  Swelling has improved some.      Review of Systems  Constitutional: Negative for fever, chills and unexpected weight change.  HENT: Negative for ear pain, nosebleeds, congestion, sore throat, rhinorrhea, sneezing, trouble swallowing, dental problem, voice change, postnasal drip and sinus pressure.   Eyes: Negative for visual disturbance.  Respiratory: Positive for shortness of breath. Negative for cough and choking.   Cardiovascular: Positive for leg swelling. Negative for chest pain.  Gastrointestinal: Negative for vomiting, abdominal pain and diarrhea.  Genitourinary: Negative for difficulty urinating.  Musculoskeletal: Negative for arthralgias.  Skin: Negative for rash.  Neurological: Negative for tremors, syncope and headaches.  Hematological: Does not bruise/bleed easily.       Objective:   Physical Exam   Filed Vitals:   03/10/11 1406  BP: 110/74  Pulse: 66  Temp: 97.9 F (36.6 C)  TempSrc: Oral  Height: 5\' 5"  (1.651 m)  Weight: 160 lb (72.576 kg)  SpO2: 98%    Gen: well appearing, no acute distress HEENT: NCAT, PERRL, EOMi, OP clear, Neck: supple without masses PULM: CTA except for a few crackles in bases CV: RRR, slight  systolic murmur, no JVD AB: BS+, soft, nontender, no hsm Ext: warm, no edema, no clubbing, no cyanosis Derm: no rash or skin breakdown Neuro: A&Ox4, CN II-XII intact, MAEW  2011 PFT's: FVC Value: 1.56 L/min Pred: 2.64 L/min % Pred: 59 %  FEV1 Value: 1.28 L Pred: 1.96 L % Pred: 65 %  FEV1/FVC Value: 82 % Pred: 74 % % Pred: 111 %  FEF 25-75 Value: 1.33 L/min Pred: 1.44 L/min % Pred: 92 %  MIP (- ) 40% pred, MEP 46% pred  11/2010 CTA cardiac (with lung windows, my read): R base peripheral/subpleural scarring, scattered GGO greatest in left lower     Assessment & Plan:   DYSPNEA Multifactorial causes of dyspnea including restrictive and obstructive lung disease, chronic angina, deconditioning and likely some degree of chronic aspiration.  This has been a fairly stable interval for Ms. Menge.    She is deconditioned.  Plan: -recommend pulmonary rehab -continue other therapies  Dysphagia Recent barium swallow reportedly normal, though she tells me that she was told that she had some slow bolus motility during the study (not sure why this wasn't reported).  She has a good handle on what foods to avoid, so have advised her to continue to monitor diet.  Chronic hypoxemic respiratory failure Continue using continuous O2 at 2L/min.     Updated Medication List Outpatient Encounter Prescriptions as of 03/10/2011  Medication Sig Dispense Refill  . albuterol (PROVENTIL,VENTOLIN) 90 MCG/ACT inhaler Inhale 2 puffs into the lungs every 6 (six) hours as needed for wheezing.  3 each  3  .  aspirin 325 MG tablet Take 325 mg by mouth daily.        Marland Kitchen atorvastatin (LIPITOR) 20 MG tablet Take 20 mg by mouth Nightly.       Jennette Banker Sodium 30-100 MG CAPS as needed.        . clopidogrel (PLAVIX) 75 MG tablet Take 75 mg by mouth daily.        . ergocalciferol (VITAMIN D2) 50000 UNITS capsule Take 50,000 Units by mouth once a week.        . escitalopram (LEXAPRO) 10 MG tablet Take 1  tablet (10 mg total) by mouth daily.  90 tablet  1  . ezetimibe (ZETIA) 10 MG tablet Take 10 mg by mouth daily.        . fenofibrate micronized (ANTARA) 130 MG capsule Take 130 mg by mouth daily.        . fluticasone (FLONASE) 50 MCG/ACT nasal spray 2 sprays by Nasal route daily.        . furosemide (LASIX) 40 MG tablet 1 daily if needed for swelling in legs/ankles  90 tablet  1  . insulin glargine (LANTUS) 100 UNIT/ML injection Inject 20 Units into the skin every morning.       . isosorbide mononitrate (IMDUR) 60 MG 24 hr tablet 1 and 1/2 two times a day  270 tablet  3  . loratadine (CLARITIN) 10 MG tablet Take 1 tablet (10 mg total) by mouth daily.  90 tablet  3  . metformin (FORTAMET) 1000 MG (OSM) 24 hr tablet Take 1,000 mg by mouth daily after supper.        . metoprolol tartrate (LOPRESSOR) 25 MG tablet Take 1 tablet (25 mg total) by mouth 2 (two) times daily.  180 tablet  3  . mirtazapine (REMERON) 15 MG tablet Take 15 mg by mouth daily.        . Multiple Vitamin (MULTIVITAMIN) capsule Take 1 capsule by mouth daily.        . nitroGLYCERIN (NITROSTAT) 0.4 MG SL tablet Place 0.4 mg under the tongue as needed.        . potassium chloride SA (KLOR-CON M20) 20 MEQ tablet Take 1 tablet daily only when you take lasix  90 tablet  1  . prednisoLONE sodium phosphate (INFLAMASE FORTE) 1 % ophthalmic solution Place 1 drop into both eyes 2 (two) times daily.        . RABEprazole (ACIPHEX) 20 MG tablet Take 20 mg by mouth daily.        . ranolazine (RANEXA) 1000 MG SR tablet Take by mouth 2 (two) times daily.       . sitaGLIPtan (JANUVIA) 100 MG tablet Take 100 mg by mouth daily.        Marland Kitchen DISCONTD: loratadine (CLARITIN) 10 MG tablet Take 1 tablet (10 mg total) by mouth daily.  90 tablet  3

## 2011-03-10 NOTE — Assessment & Plan Note (Signed)
Recent barium swallow reportedly normal, though she tells me that she was told that she had some slow bolus motility during the study (not sure why this wasn't reported).  She has a good handle on what foods to avoid, so have advised her to continue to monitor diet.

## 2011-03-10 NOTE — Assessment & Plan Note (Signed)
Multifactorial causes of dyspnea including restrictive and obstructive lung disease, chronic angina, deconditioning and likely some degree of chronic aspiration.  This has been a fairly stable interval for Christine Blanchard.    She is deconditioned.  Plan: -recommend pulmonary rehab -continue other therapies

## 2011-03-16 ENCOUNTER — Telehealth: Payer: Self-pay | Admitting: Pulmonary Disease

## 2011-03-16 ENCOUNTER — Telehealth: Payer: Self-pay | Admitting: Cardiovascular Disease

## 2011-03-16 NOTE — Telephone Encounter (Signed)
Will forward to Dr. Gollan for review. 

## 2011-03-16 NOTE — Telephone Encounter (Signed)
Should be ok for her to participate

## 2011-03-16 NOTE — Telephone Encounter (Signed)
Per 2.6.13 ov note with DMQ  Patient Instructions     We will refer you to pulmonary rehab if it is OK with Dr. Mariah Milling  Continue taking your medications as prescribed.  We will see you back in 3 months    Called spoke with patient and informed her that DMQ wanted to wait for approval from her cardiologist Dr Mariah Milling.  Pt stated that she thought our office would contact him.  Pt stated that she will call Dr Windell Hummingbird office and have them contact this office in reference to pulm rehab.    Will sign off and forward to DM[facility].  Pt aware he is not in the office this week and is okay with this fact.

## 2011-03-16 NOTE — Telephone Encounter (Signed)
Pt wants to know if she can go to pulmonary rehab. Dr Kendrick Fries told her dr Mariah Milling would need to clear her from a cardiac standpoint.

## 2011-03-17 ENCOUNTER — Encounter: Payer: Self-pay | Admitting: Pulmonary Disease

## 2011-03-17 NOTE — Telephone Encounter (Signed)
Pt was notified.  

## 2011-03-19 ENCOUNTER — Telehealth: Payer: Self-pay | Admitting: Pulmonary Disease

## 2011-03-19 DIAGNOSIS — J9610 Chronic respiratory failure, unspecified whether with hypoxia or hypercapnia: Secondary | ICD-10-CM

## 2011-03-19 NOTE — Telephone Encounter (Signed)
I spoke with pt and notified will go ahead and send order to [facility] for pulmonary rehab at [facility] since this was okayed by Dr. Mariah Milling. Pt verbalized understanding and states nothing further needed.

## 2011-03-24 ENCOUNTER — Telehealth: Payer: Self-pay | Admitting: Pulmonary Disease

## 2011-03-24 NOTE — Telephone Encounter (Signed)
Spoke with Christine Blanchard with [facility].  Chronic Resp Failure is too broad and will not qualify as a dx code for Pulm Rehab.  Dyspnea will not qualify either.  Christine Blanchard is not sure is asthmatic bronchitis will work - he is sure that asthma alone or chronic bronchitis alone will work.  Also, CHF, interstitial lung disease, fibrosis, sleep apnea, lung cancer, scleroderma, PAH, morbid obesity, and bronchiectasis are common diagnosis that will be covered but these are not on pt's problem list.  Dr. Kendrick Fries, will asthma or chronic bronchitis work as a diagnosis for Pulmonary Rehab?  Please advise.  Thanks!

## 2011-03-24 NOTE — Telephone Encounter (Signed)
LMTCB; pts problem list shows asthmatic bronchitis and Dyspnea. If this will not cover it then we will need to speak with Dr. Kendrick Fries.

## 2011-03-29 NOTE — Telephone Encounter (Signed)
Chronic bronchitis will work, let's use that.

## 2011-03-30 NOTE — Telephone Encounter (Signed)
LMTCB for Christine Blanchard- ? If VO is okay or do we need to fax a whole new order

## 2011-03-30 NOTE — Telephone Encounter (Signed)
Spoke with Jeannett Senior. He states that new order will need to be sent. I have sent new order to [facility] for this.

## 2011-04-01 ENCOUNTER — Other Ambulatory Visit: Payer: Self-pay | Admitting: *Deleted

## 2011-04-01 MED ORDER — "INSULIN SYRINGE-NEEDLE U-100 31G X 5/16"" 1 ML MISC": Status: DC

## 2011-04-01 MED ORDER — ATORVASTATIN CALCIUM 20 MG PO TABS: 20.0000 mg | ORAL_TABLET | Freq: Every evening | ORAL | Status: DC

## 2011-04-01 MED ORDER — MIRTAZAPINE 15 MG PO TABS: 15.0000 mg | ORAL_TABLET | Freq: Every day | ORAL | Status: DC

## 2011-04-01 MED ORDER — ERGOCALCIFEROL 1.25 MG (50000 UT) PO CAPS: 50000.0000 [IU] | ORAL_CAPSULE | ORAL | Status: DC

## 2011-04-01 MED ORDER — EZETIMIBE 10 MG PO TABS: 10.0000 mg | ORAL_TABLET | Freq: Every day | ORAL | Status: DC

## 2011-04-02 ENCOUNTER — Telehealth: Payer: Self-pay | Admitting: Pulmonary Disease

## 2011-04-02 NOTE — Telephone Encounter (Signed)
I faxed her last PFT and ECG that we had in the system.

## 2011-04-06 ENCOUNTER — Encounter: Payer: Self-pay | Admitting: Pulmonary Disease

## 2011-04-06 DIAGNOSIS — J42 Unspecified chronic bronchitis: Secondary | ICD-10-CM | POA: Diagnosis not present

## 2011-04-06 DIAGNOSIS — Z5189 Encounter for other specified aftercare: Secondary | ICD-10-CM | POA: Diagnosis not present

## 2011-04-06 DIAGNOSIS — Z79899 Other long term (current) drug therapy: Secondary | ICD-10-CM | POA: Diagnosis not present

## 2011-04-07 ENCOUNTER — Other Ambulatory Visit: Payer: Self-pay | Admitting: Cardiovascular Disease

## 2011-04-07 MED ORDER — RANOLAZINE ER 1000 MG PO TB12: 1000.0000 mg | ORAL_TABLET | Freq: Two times a day (BID) | ORAL | Status: DC

## 2011-04-07 NOTE — Telephone Encounter (Signed)
Refill sent for ranexa  

## 2011-04-16 ENCOUNTER — Other Ambulatory Visit: Payer: Self-pay | Admitting: *Deleted

## 2011-04-16 MED ORDER — "PEN NEEDLES 5/16"" 31G X 8 MM MISC": Status: DC

## 2011-04-19 ENCOUNTER — Telehealth: Payer: Self-pay | Admitting: Pulmonary Disease

## 2011-04-19 NOTE — Telephone Encounter (Signed)
Noted.  Please call in an RX for furosemide 40mg  po daily x 10 pills with no refills to a local pharmacy of her choice.    I wrote her an Rx for lasix one other time and have requested that her PCP and/or cardiologist manage this.  However, I still keep getting calls regarding the lasix.  I will give her lasix this time around because it sounds like she needs it, but please stress to her that she needs to call her PCP or Cardiologist for future lasix dosing/refills.

## 2011-04-19 NOTE — Telephone Encounter (Signed)
Called spoke with patient, advised of Dr Ulyses Jarred recs regarding the lasix.  Pt stated that she has approx 20 tabs that were filled by [facility].  rx not sent per pt's request.  Pt advised to contact her PCP regarding how to take this medication.

## 2011-04-19 NOTE — Telephone Encounter (Signed)
Called, spoke with pt.  Pt states [facility] would not fill furosemide because it interacted with another medication.  She believes this interaction is with Renexa but not sure.  States her last does was 03/12/11 and reports a weight gain of 5 lb in 2 days and c/o legs being swollen.  She is requesting further recs on this today.    Called [facility], spoke with Aram Beecham who transferred me to [place].  Per Eileen Stanford, they declined the furosemide rx on January 29 because of pt's allergy to sulfa.  States they will fill this medication for pt and it will be covered; however, they first have to make sure it will be safe for pt considering her allergy to sulfa.  Dr. Kendrick Fries, pls advise.  Thank you.  Note: Per Eileen Stanford, if pt will need this medication today, it will need to be called into a local pharm.

## 2011-04-20 ENCOUNTER — Other Ambulatory Visit: Payer: Self-pay | Admitting: Family Medicine

## 2011-04-20 ENCOUNTER — Telehealth: Payer: Self-pay | Admitting: *Deleted

## 2011-04-20 DIAGNOSIS — E119 Type 2 diabetes mellitus without complications: Secondary | ICD-10-CM

## 2011-04-20 DIAGNOSIS — I10 Essential (primary) hypertension: Secondary | ICD-10-CM

## 2011-04-20 NOTE — Telephone Encounter (Signed)
plz notify moderate effect between lasix and remeron - may loewr blood pressure, but if has been taking both together and not more dizzy, BP stable, should do ok.  Will closely monitor this at next viist and may discuss then.

## 2011-04-20 NOTE — Telephone Encounter (Signed)
Patient called and stated that the pharmacist stated that she shouldn't be taking Lasix and Remeron together.  She is very concerned and wants to know what Dr. Sharen Hones thinks about this.  Please advise.

## 2011-04-21 DIAGNOSIS — H26499 Other secondary cataract, unspecified eye: Secondary | ICD-10-CM | POA: Diagnosis not present

## 2011-04-21 NOTE — Telephone Encounter (Signed)
Patient notified. She hasn't had any problems, but would like to discuss it in more detail at her visit. I advised to call if she had anymore questions or if she had any issues.

## 2011-04-23 ENCOUNTER — Telehealth: Payer: Self-pay | Admitting: Pulmonary Disease

## 2011-04-23 MED ORDER — LORATADINE 10 MG PO TABS: 10.0000 mg | ORAL_TABLET | Freq: Every day | ORAL | Status: DC

## 2011-04-23 NOTE — Telephone Encounter (Signed)
Rx for loratidine was sent to pharm per pt request. Spoke with pt and notified of this and she verbalized understanding and states nothing further needed.

## 2011-04-27 ENCOUNTER — Other Ambulatory Visit: Payer: Medicare Other

## 2011-04-28 ENCOUNTER — Other Ambulatory Visit (INDEPENDENT_AMBULATORY_CARE_PROVIDER_SITE_OTHER): Payer: Medicare Other

## 2011-04-28 DIAGNOSIS — E119 Type 2 diabetes mellitus without complications: Secondary | ICD-10-CM

## 2011-04-28 DIAGNOSIS — I10 Essential (primary) hypertension: Secondary | ICD-10-CM

## 2011-04-28 LAB — BASIC METABOLIC PANEL
CO2: 31 mEq/L (ref 19–32)
Chloride: 100 mEq/L (ref 96–112)
Glucose, Bld: 118 mg/dL — ABNORMAL HIGH (ref 70–99)
Potassium: 4.1 mEq/L (ref 3.5–5.1)
Sodium: 140 mEq/L (ref 135–145)

## 2011-04-28 LAB — MICROALBUMIN / CREATININE URINE RATIO: Microalb Creat Ratio: 0.7 mg/g (ref 0.0–30.0)

## 2011-05-03 ENCOUNTER — Encounter: Payer: Self-pay | Admitting: Pulmonary Disease

## 2011-05-03 DIAGNOSIS — J42 Unspecified chronic bronchitis: Secondary | ICD-10-CM | POA: Diagnosis not present

## 2011-05-03 DIAGNOSIS — Z79899 Other long term (current) drug therapy: Secondary | ICD-10-CM | POA: Diagnosis not present

## 2011-05-03 DIAGNOSIS — Z5189 Encounter for other specified aftercare: Secondary | ICD-10-CM | POA: Diagnosis not present

## 2011-05-06 ENCOUNTER — Ambulatory Visit: Payer: Medicare Other | Admitting: Family Medicine

## 2011-05-10 ENCOUNTER — Encounter: Payer: Self-pay | Admitting: Family Medicine

## 2011-05-10 ENCOUNTER — Ambulatory Visit (INDEPENDENT_AMBULATORY_CARE_PROVIDER_SITE_OTHER): Payer: Medicare Other | Admitting: Family Medicine

## 2011-05-10 VITALS — BP 140/72 | HR 80 | Temp 98.4°F | Wt 164.0 lb

## 2011-05-10 DIAGNOSIS — I519 Heart disease, unspecified: Secondary | ICD-10-CM | POA: Diagnosis not present

## 2011-05-10 DIAGNOSIS — G250 Essential tremor: Secondary | ICD-10-CM | POA: Diagnosis not present

## 2011-05-10 DIAGNOSIS — I10 Essential (primary) hypertension: Secondary | ICD-10-CM

## 2011-05-10 DIAGNOSIS — F329 Major depressive disorder, single episode, unspecified: Secondary | ICD-10-CM | POA: Diagnosis not present

## 2011-05-10 DIAGNOSIS — G252 Other specified forms of tremor: Secondary | ICD-10-CM

## 2011-05-10 DIAGNOSIS — E119 Type 2 diabetes mellitus without complications: Secondary | ICD-10-CM

## 2011-05-10 DIAGNOSIS — J9610 Chronic respiratory failure, unspecified whether with hypoxia or hypercapnia: Secondary | ICD-10-CM

## 2011-05-10 DIAGNOSIS — E785 Hyperlipidemia, unspecified: Secondary | ICD-10-CM

## 2011-05-10 DIAGNOSIS — J9611 Chronic respiratory failure with hypoxia: Secondary | ICD-10-CM

## 2011-05-10 DIAGNOSIS — I5189 Other ill-defined heart diseases: Secondary | ICD-10-CM

## 2011-05-10 DIAGNOSIS — M7989 Other specified soft tissue disorders: Secondary | ICD-10-CM

## 2011-05-10 NOTE — Assessment & Plan Note (Signed)
Chronic. Prior followed by endo. Recent deterioration as evidenced by A1c 5.9 -> 7.1%.  Pt endorses liberties with diabetic diet.  Discussed importance of dietary adherence. Recheck in 3 mo.  No changes today.

## 2011-05-10 NOTE — Assessment & Plan Note (Signed)
Mild LE edema today, lungs clear. Discussed use of lasix, ok to continue for now sparingly. No worsening dyspnea. Consider rpt echo down road.

## 2011-05-10 NOTE — Assessment & Plan Note (Signed)
Chronic. On lipitor, zetia, and antara. Continue meds for now. Good control.

## 2011-05-10 NOTE — Assessment & Plan Note (Signed)
No changes today.  bp elevated in office but pt states at home and at pulm rehab bp better controlled. BP Readings from Last 3 Encounters:  05/10/11 140/72  03/10/11 110/74  02/04/11 138/74  on metoprolol.  Consider change from metoprolol to carvedilol given hx DM and lung disease (combined A and B blockade).

## 2011-05-10 NOTE — Assessment & Plan Note (Signed)
Stable on 20mg  lexapro, continue. tried to titrate down, failed.

## 2011-05-10 NOTE — Assessment & Plan Note (Signed)
Currently undergoing pulm rehab. Restrictive and obstructive lung disease on last PFTs.  Decreased MEP, MIP

## 2011-05-10 NOTE — Assessment & Plan Note (Signed)
Known diastolic dysfunction. Ok to continue lasix PRN periph edema. Continue to monitor.

## 2011-05-10 NOTE — Progress Notes (Signed)
  Subjective:    Patient ID: Christine Blanchard, female    DOB: [birth year], 78 y.o.   MRN: [id number]  HPI CC: 95mo f/u DM  Christine Blanchard is a very pleasant 78 year old woman with h/o severe CAD s/p CABG in 2004 w/ PCI 6 months after her bypass, repeat PCI one year later complicated by CVA with residual R weakness, recently recommended medical management only, HLD, T2DM, HTN, spinal stenosis who currently lives in independent living at [facility] in [place], also with chronic unsteady gait and worsening dyspnea now on chronic oxygen.  Presents alone today.  Drives herself.   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  Not doing as well as would like to be doing.  DM - blood sugar dropped to 60 at pulm rehab x1.  This also happens at home, usually worse at night and once a month.  Tries to take bedtime snack to avoid lows such as yogurt, no lows in the last month.  Has had recent vision screen.  Checks sugars at home TID, over last month running high in AMs - 130s.  Notes has not been strictly following diabetic diet over last month.    HTN - BP at pulm rehab this morning 118/68.  Tends to run well there.  No HA, vision changes, CP/tightness, SOB. + leg swelling.  Taking more nitros recently.  Has had some discomfort in shoulder blades (anginal equivalent).  Known severe inoperable CAD.  H/o corneal transplant, found scar tissue, had laser surgery 2 wks ago at [facility].  Wt Readings from Last 3 Encounters:  05/10/11 164 lb (74.39 kg)  03/10/11 160 lb (72.576 kg)  02/04/11 160 lb 12 oz (72.916 kg)  has gained 2.5lbs in last few days at pulm rehab, Dr. Kendrick Fries recently started pt on lasix with potassium.  Pt was worried about remeron interaction with lasix, but discussed should do ok with this as long as no lows.  Trouble sleeping - no caffeine after 1pm.  Tried lunesta but caused bad dreams.  Took ativan per endo which helped when doubled up at night.  Takes maybe 1-2  nights/wk.  lexapro dropped to 10mg  daily but caused worsening depression so self increased back up to 20mg .  Stable on this dose.  Review of Systems Per HPI    Objective:   Physical Exam  Nursing note and vitals reviewed. Constitutional: She appears well-developed and well-nourished. No distress.  HENT:  Head: Normocephalic and atraumatic.  Mouth/Throat: Oropharynx is clear and moist. No oropharyngeal exudate.  Neck: Normal range of motion. Neck supple.  Cardiovascular: Normal rate, regular rhythm, normal heart sounds and intact distal pulses.   No murmur heard. Pulmonary/Chest: Effort normal and breath sounds normal. No respiratory distress. She has no wheezes. She has no rales.  Musculoskeletal: She exhibits edema (1+ pitting edema LLE).       Diabetic foot exam: Normal inspection No skin breakdown No calluses  Normal DP/PT pulses Normal sensation to light touch and monofilament Nails normal   Lymphadenopathy:    She has no cervical adenopathy.  Skin: Skin is warm and dry. No rash noted.  Psychiatric: She has a normal mood and affect.       Assessment & Plan:

## 2011-05-10 NOTE — Patient Instructions (Addendum)
Call me with dose of ativan you take. Good to see you today, return in 3 months, prior fasting for blood work. Keep eye on diet as your a1c has increased to 7%.  We will recheck it in 3 months.

## 2011-05-10 NOTE — Assessment & Plan Note (Signed)
Discussed remeron and lasix concerns - should do ok, continue both as needed, using lasix sparingly

## 2011-05-11 ENCOUNTER — Telehealth: Payer: Self-pay

## 2011-05-11 MED ORDER — LORAZEPAM 1 MG PO TABS: 1.0000 mg | ORAL_TABLET | Freq: Every evening | ORAL | Status: DC | PRN

## 2011-05-11 NOTE — Telephone Encounter (Signed)
Pt saw Dr Sharen Hones 05/10/11 and was to call back with mg of Ativan. Pt said she takes generic Lorazopam 0.5 mg taking two tabs by mouth at bedtime as needed for sleep. [facility] and can be reached at [phone].

## 2011-05-11 NOTE — Telephone Encounter (Signed)
May send in ativan 1mg  to take one nightly as needed.  Placed in chart. To use sparingly for sleep.

## 2011-05-12 NOTE — Telephone Encounter (Signed)
Message left for patient to call back and advise if she wants to wait for mail order to arrive or if she wants to pick up locally. Will wait to hear back from patient before calling Rx in.

## 2011-05-13 NOTE — Telephone Encounter (Signed)
PATIENT CAN WAIT WONT BE OUT FOR 2 WEEKS

## 2011-06-02 ENCOUNTER — Encounter: Payer: Self-pay | Admitting: Pulmonary Disease

## 2011-06-02 DIAGNOSIS — R0602 Shortness of breath: Secondary | ICD-10-CM | POA: Diagnosis not present

## 2011-06-02 DIAGNOSIS — J42 Unspecified chronic bronchitis: Secondary | ICD-10-CM | POA: Diagnosis not present

## 2011-06-02 DIAGNOSIS — Z5189 Encounter for other specified aftercare: Secondary | ICD-10-CM | POA: Diagnosis not present

## 2011-06-09 ENCOUNTER — Ambulatory Visit (INDEPENDENT_AMBULATORY_CARE_PROVIDER_SITE_OTHER): Payer: Medicare Other | Admitting: Cardiovascular Disease

## 2011-06-09 ENCOUNTER — Encounter: Payer: Self-pay | Admitting: Cardiovascular Disease

## 2011-06-09 VITALS — BP 138/79 | HR 70 | Ht 65.0 in | Wt 161.0 lb

## 2011-06-09 DIAGNOSIS — E119 Type 2 diabetes mellitus without complications: Secondary | ICD-10-CM

## 2011-06-09 DIAGNOSIS — M7989 Other specified soft tissue disorders: Secondary | ICD-10-CM | POA: Diagnosis not present

## 2011-06-09 DIAGNOSIS — I10 Essential (primary) hypertension: Secondary | ICD-10-CM | POA: Diagnosis not present

## 2011-06-09 DIAGNOSIS — I2510 Atherosclerotic heart disease of native coronary artery without angina pectoris: Secondary | ICD-10-CM

## 2011-06-09 DIAGNOSIS — R079 Chest pain, unspecified: Secondary | ICD-10-CM | POA: Diagnosis not present

## 2011-06-09 DIAGNOSIS — E785 Hyperlipidemia, unspecified: Secondary | ICD-10-CM

## 2011-06-09 DIAGNOSIS — R0602 Shortness of breath: Secondary | ICD-10-CM | POA: Diagnosis not present

## 2011-06-09 MED ORDER — FUROSEMIDE 40 MG PO TABS: ORAL_TABLET | ORAL | Status: DC

## 2011-06-09 NOTE — Assessment & Plan Note (Signed)
For her shortness of breath, we have suggested she take Lasix 3 times a week. She drinks half a gallon of iced tea per day and we have suggested she decrease her fluid intake. She eats out for lunch every day and we have suggested she watch her salt intake. Goal weight is 155 pounds which would be approximately 5 pound weight drop. I suggested we watch her blood work every several months.

## 2011-06-09 NOTE — Patient Instructions (Addendum)
Take lasix Tuesday, Thursday and Saturday Goal weight is 155lb Cut back on ice tea  Do not take lasix for weight less than 155 Take lasix with potassium 1/2 pill  Please call us if you have new issues that need to be addressed before your next appt.  Your physician wants you to follow-up in: 3 months.  You will receive a reminder letter in the mail two months in advance. If you don't receive a letter, please call our office to schedule the follow-up appointment.

## 2011-06-09 NOTE — Assessment & Plan Note (Signed)
Blood pressure is well controlled on today's visit. No changes made to the medications. 

## 2011-06-09 NOTE — Assessment & Plan Note (Signed)
Cholesterol is at goal on the current lipid regimen. No changes to the medications were made.  

## 2011-06-09 NOTE — Assessment & Plan Note (Signed)
She eats out for lunch every day. Have asked her to watch her diet and increase her walking as tolerated.

## 2011-06-09 NOTE — Progress Notes (Signed)
Patient ID: Christine Blanchard, female    DOB: 1928-09-05, 78 y.o.   MRN: [id number]  HPI Comments: Christine Blanchard is a very pleasant 78 year old woman with history of coronary artery disease, bypass surgery in 2004, hyperlipidemia, PCI 6 months after her bypass, repeat PCI one year later (She does report having a stroke after her cardiac catheterization), diabetes, hypertension, spinal stenosis who currently lives in a nursing home in [place], who has chronic unsteady gait  with worsening of her shortness of breath over the past year and  on chronic oxygen who presents for routine followup.  Previous cardiac CTA was ordered for atypical chest pain that showed: Patent SVG to D1 with poor distal runoff,  Occluded LIMA to LAD.  Distal LAD never visualized,  RCA and circumflex patient with multiple 50% or less calcfic Lesions,  Poor distal runoff from stented IM/LAD and small D1 supplied by SVG  She has been participating in physical therapy 3 times per week. Her weight has been slowly trending upwards. She has worsening edema, worsening shortness of breath. Difficulty sleeping at nighttime though uncertain if this is secondary to shortness of breath. She took Remeron x2 and reports having a better night sleep. She takes Lasix on a rare occasion and has only taken 17 pills since December 2012. Ideal weight for her she feels is in the mid to low 150 range. She is about 5 or 10 pounds from this. She is up 3 pounds from her last clinic visit  Echocardiogram performed February 2012 was essentially normal with mild TR, normal right ventricular systolic pressure Stress test in August 2011 was normal with no ischemia, normal LV function.  EKG shows Normal sinus rhythm with rate 70 beats per minute with poor R-wave progression through the anterior precordial leads, nonspecific T wave abnormality   Outpatient Encounter Prescriptions as of 06/09/2011  Medication Sig Dispense Refill  . albuterol (PROVENTIL,VENTOLIN) 90  MCG/ACT inhaler Inhale 2 puffs into the lungs every 6 (six) hours as needed for wheezing.  3 each  3  . aspirin 325 MG tablet Take 325 mg by mouth daily.        Marland Kitchen atorvastatin (LIPITOR) 20 MG tablet Take 1 tablet (20 mg total) by mouth Nightly.  90 tablet  3  . Casanthranol-Docusate Sodium 30-100 MG CAPS as needed.        . clopidogrel (PLAVIX) 75 MG tablet Take 75 mg by mouth daily.        . ergocalciferol (VITAMIN D2) 50000 UNITS capsule Take 1 capsule (50,000 Units total) by mouth once a week.  4 capsule  3  . escitalopram (LEXAPRO) 20 MG tablet Take 20 mg by mouth daily.      Marland Kitchen ezetimibe (ZETIA) 10 MG tablet Take 1 tablet (10 mg total) by mouth daily.  90 tablet  3  . fenofibrate micronized (ANTARA) 130 MG capsule Take 130 mg by mouth daily.        . fluticasone (FLONASE) 50 MCG/ACT nasal spray 2 sprays by Nasal route daily.        . furosemide (LASIX) 40 MG tablet 1 daily if needed for swelling in legs/ankles  30 tablet  6  . insulin glargine (LANTUS) 100 UNIT/ML injection Inject 20 Units into the skin every morning.       . Insulin Pen Needle (PEN NEEDLES 31GX5/16") 31G X 8 MM MISC Use to check blood sugar three times daily.  Dx:250.00  100 each  12  . isosorbide mononitrate (IMDUR)  60 MG 24 hr tablet 1 and 1/2 two times a day  270 tablet  3  . loratadine (CLARITIN) 10 MG tablet Take 1 tablet (10 mg total) by mouth daily.  90 tablet  1  . metformin (FORTAMET) 1000 MG (OSM) 24 hr tablet Take 1,000 mg by mouth daily after supper.        . metoprolol tartrate (LOPRESSOR) 25 MG tablet Take 1 tablet (25 mg total) by mouth 2 (two) times daily.  180 tablet  3  . mirtazapine (REMERON) 15 MG tablet Take 1 tablet (15 mg total) by mouth daily.  90 tablet  3  . Multiple Vitamin (MULTIVITAMIN) capsule Take 1 capsule by mouth daily.        . nitroGLYCERIN (NITROSTAT) 0.4 MG SL tablet Place 0.4 mg under the tongue as needed.        . potassium chloride (KLOR-CON) 20 MEQ packet Take 20 mEq by mouth daily.       . potassium chloride SA (KLOR-CON M20) 20 MEQ tablet Take 1 tablet daily only when you take lasix  90 tablet  1  . prednisoLONE sodium phosphate (INFLAMASE FORTE) 1 % ophthalmic solution Place 1 drop into both eyes 2 (two) times daily.        . RABEprazole (ACIPHEX) 20 MG tablet Take 20 mg by mouth daily.        . ranolazine (RANEXA) 1000 MG SR tablet Take 1 tablet (1,000 mg total) by mouth 2 (two) times daily.  180 tablet  3  . sitaGLIPtan (JANUVIA) 100 MG tablet Take 100 mg by mouth daily.        Marland Kitchen DISCONTD: furosemide (LASIX) 40 MG tablet 1 daily if needed for swelling in legs/ankles  90 tablet  1    Review of Systems  Constitutional: Negative for fever and unexpected weight change.  HENT: Negative.   Eyes: Negative.   Respiratory: Positive for shortness of breath. Negative for apnea, cough, choking, chest tightness, wheezing and stridor.   Gastrointestinal: Negative.   Musculoskeletal: Positive for gait problem.       Profound leg weakness  Skin: Negative.   Neurological: Negative.   Hematological: Negative.   Psychiatric/Behavioral: Negative.   All other systems reviewed and are negative.    BP 138/79  Pulse 70  Ht 5\' 5"  (1.651 m)  Wt 161 lb (73.029 kg)  BMI 26.79 kg/m2  Physical Exam  Nursing note and vitals reviewed. Constitutional: She is oriented to person, place, and time. She appears well-developed and well-nourished.  HENT:  Head: Normocephalic.  Nose: Nose normal.  Mouth/Throat: Oropharynx is clear and moist.  Eyes: Conjunctivae are normal. Pupils are equal, round, and reactive to light.  Neck: Normal range of motion. Neck supple. No JVD present.  Cardiovascular: Normal rate, regular rhythm, S1 normal, S2 normal and intact distal pulses.  Exam reveals no gallop and no friction rub.   Murmur heard.  Crescendo systolic murmur is present with a grade of 2/6       Trace edema  , worse on the left lower extremity than the right  Pulmonary/Chest: Effort normal.  No respiratory distress. She has decreased breath sounds. She has no wheezes. She has no rales. She exhibits no tenderness.  Abdominal: Soft. Bowel sounds are normal. She exhibits no distension. There is no tenderness.  Musculoskeletal: Normal range of motion. She exhibits edema. She exhibits no tenderness.  Lymphadenopathy:    She has no cervical adenopathy.  Neurological: She is alert and oriented  to person, place, and time. Coordination normal.  Skin: Skin is warm and dry. No rash noted. No erythema.  Psychiatric: She has a normal mood and affect. Her behavior is normal. Judgment and thought content normal.         Assessment and Plan

## 2011-06-09 NOTE — Assessment & Plan Note (Signed)
Cholesterol is perfect. No significant chest pain at this time. Previous cardiac CT showing severe diffuse disease.

## 2011-06-14 ENCOUNTER — Encounter: Payer: Self-pay | Admitting: Pulmonary Disease

## 2011-06-14 ENCOUNTER — Ambulatory Visit (INDEPENDENT_AMBULATORY_CARE_PROVIDER_SITE_OTHER): Payer: Medicare Other | Admitting: Pulmonary Disease

## 2011-06-14 VITALS — BP 124/62 | HR 75 | Temp 97.5°F | Ht 65.0 in | Wt 162.0 lb

## 2011-06-14 DIAGNOSIS — R0602 Shortness of breath: Secondary | ICD-10-CM

## 2011-06-14 DIAGNOSIS — J9610 Chronic respiratory failure, unspecified whether with hypoxia or hypercapnia: Secondary | ICD-10-CM | POA: Diagnosis not present

## 2011-06-14 DIAGNOSIS — J9611 Chronic respiratory failure with hypoxia: Secondary | ICD-10-CM

## 2011-06-14 NOTE — Assessment & Plan Note (Signed)
Improving slowly with pulmonary rehab.  There is not other reversible etiology that I can see at this point after a fairly extensive work up.  Best symptom management is really exercise to try to keep her strong and minimize dyspnea with ADL's.  She is to continue with pulmonary rehab and then transition to either a home exercise routine (at her ALF) or come back to [facility] [employer] exercise.

## 2011-06-14 NOTE — Patient Instructions (Signed)
Read and go over our sleep hygiene instructions Drink warm mild before going to bed Continue pulmonary rehab Continue using the oxygen We will see you back in 4-6 months.

## 2011-06-14 NOTE — Progress Notes (Signed)
Subjective:    Patient ID: Christine Blanchard, female    DOB: Jun 05, 1928, 78 y.o.   MRN: [id number]  Synopsis: 78 y/o female with coronary artery disease and chronic angina who has been followed by [facility] in [facility] (by Dr. Delton Coombes) for years and [facility] in [place] since 02/2011 for chronic dyspnea and hypoxemic respiratory failure.  Objectively she has both restrictive and obstructive disease and low MIP and MEP.   HPI 03/10/11 ROV -- Christine Blanchard reports to me that she is doing "great". She states that she just finished two months of physical therapy and feels better after doing so.  Her shortness of breath has been manageable lately.  She still notices that she chokes on some food, specifically "crumbly" foods like popcorn and cornbread.  Swelling has improved some.    06/14/11 ROV-- Christine Blanchard says that she likes pulmonary rehab and is able to exercise more than when she started.  She says that she really likes the staff there and says they are pushing her more. She feels physically stronger but is tired at home afterwards.  Her chronic angina is still limiting her somewhat.  She has days where she takes multiple nitroglyerin tabs.  She has not had fever, chills, or cough.  She is noting difficulty with sleeping.  Specifically she can't fall asleep after lying down until about 3AM.  She does not use caffeinated beverages other than some tea with lunch.  Her swelling has improved on her current dose of lasix.  Review of Systems  Constitutional: Negative for fever, chills and unexpected weight change.  Respiratory: Positive for shortness of breath. Negative for cough and choking.   Cardiovascular: Positive for leg swelling. Negative for chest pain.       Objective:   Physical Exam   Filed Vitals:   06/14/11 1430 06/14/11 1433  BP: 124/62   Pulse: 75   Temp: 97.5 F (36.4 C)   TempSrc: Oral   Height: 5\' 5"  (1.651 m)   Weight: 162 lb (73.483 kg)   SpO2: 95% 95%    Gen: well  appearing, no acute distress HEENT: NCAT, PERRL, EOMi, OP clear, Neck: supple without masses PULM: few crackles in bases, otherwise clear CV: RRR, slight systolic murmur, no JVD AB: BS+, soft, nontender, no hsm Ext: warm, trace ankle edema, no clubbing, no cyanosis   2011 PFT's: FVC Value: 1.56 L/min Pred: 2.64 L/min % Pred: 59 %  FEV1 Value: 1.28 L Pred: 1.96 L % Pred: 65 %  FEV1/FVC Value: 82 % Pred: 74 % % Pred: 111 %  FEF 25-75 Value: 1.33 L/min Pred: 1.44 L/min % Pred: 92 %  MIP (- ) 40% pred, MEP 46% pred  11/2010 CTA cardiac (with lung windows, my read): R base peripheral/subpleural scarring, scattered GGO greatest in left lower     Assessment & Plan:   Chronic hypoxemic respiratory failure Continue using the oxygen at 2 L/min continuously  DYSPNEA Improving slowly with pulmonary rehab.  There is not other reversible etiology that I can see at this point after a fairly extensive work up.  Best symptom management is really exercise to try to keep her strong and minimize dyspnea with ADL's.  She is to continue with pulmonary rehab and then transition to either a home exercise routine (at her ALF) or come back to [facility] [employer] exercise.     Updated Medication List Outpatient Encounter Prescriptions as of 06/14/2011  Medication Sig Dispense Refill  . albuterol (PROVENTIL,VENTOLIN)  90 MCG/ACT inhaler Inhale 2 puffs into the lungs every 6 (six) hours as needed for wheezing.  3 each  3  . aspirin 325 MG tablet Take 325 mg by mouth daily.        Marland Kitchen atorvastatin (LIPITOR) 20 MG tablet Take 1 tablet (20 mg total) by mouth Nightly.  90 tablet  3  . Casanthranol-Docusate Sodium 30-100 MG CAPS as needed.        . clopidogrel (PLAVIX) 75 MG tablet Take 75 mg by mouth daily.        . ergocalciferol (VITAMIN D2) 50000 UNITS capsule Take 1 capsule (50,000 Units total) by mouth once a week.  4 capsule  3  . escitalopram (LEXAPRO) 20 MG tablet Take 20 mg by mouth daily.       Marland Kitchen ezetimibe (ZETIA) 10 MG tablet Take 1 tablet (10 mg total) by mouth daily.  90 tablet  3  . fenofibrate micronized (ANTARA) 130 MG capsule Take 130 mg by mouth daily.        . fluticasone (FLONASE) 50 MCG/ACT nasal spray 2 sprays by Nasal route daily.        . furosemide (LASIX) 40 MG tablet 1 daily every tues, thurs, and saturday      . insulin glargine (LANTUS) 100 UNIT/ML injection Inject 20 Units into the skin every morning.       . Insulin Pen Needle (PEN NEEDLES 31GX5/16") 31G X 8 MM MISC Use to check blood sugar three times daily.  Dx:250.00  100 each  12  . isosorbide mononitrate (IMDUR) 60 MG 24 hr tablet 1 and 1/2 two times a day  270 tablet  3  . loratadine (CLARITIN) 10 MG tablet Take 1 tablet (10 mg total) by mouth daily.  90 tablet  1  . metformin (FORTAMET) 1000 MG (OSM) 24 hr tablet Take 1,000 mg by mouth daily after supper.        . metoprolol tartrate (LOPRESSOR) 25 MG tablet Take 1 tablet (25 mg total) by mouth 2 (two) times daily.  180 tablet  3  . mirtazapine (REMERON) 15 MG tablet Take 1 tablet (15 mg total) by mouth daily.  90 tablet  3  . Multiple Vitamin (MULTIVITAMIN) capsule Take 1 capsule by mouth daily.        . nitroGLYCERIN (NITROSTAT) 0.4 MG SL tablet Place 0.4 mg under the tongue as needed.        . potassium chloride SA (K-DUR,KLOR-CON) 20 MEQ tablet Take 1/2 tablet daily only when you take lasix      . prednisoLONE sodium phosphate (INFLAMASE FORTE) 1 % ophthalmic solution Place 1 drop into both eyes 2 (two) times daily.        . RABEprazole (ACIPHEX) 20 MG tablet Take 20 mg by mouth daily.        . ranolazine (RANEXA) 1000 MG SR tablet Take 1 tablet (1,000 mg total) by mouth 2 (two) times daily.  180 tablet  3  . sitaGLIPtan (JANUVIA) 100 MG tablet Take 100 mg by mouth daily.        Marland Kitchen DISCONTD: furosemide (LASIX) 40 MG tablet 1 daily if needed for swelling in legs/ankles  30 tablet  6  . DISCONTD: potassium chloride SA (KLOR-CON M20) 20 MEQ tablet Take 1  tablet daily only when you take lasix  90 tablet  1  . DISCONTD: potassium chloride (KLOR-CON) 20 MEQ packet Take 20 mEq by mouth daily.

## 2011-06-14 NOTE — Assessment & Plan Note (Signed)
Continue using the oxygen at 2 L/min continuously

## 2011-06-24 ENCOUNTER — Other Ambulatory Visit: Payer: Self-pay

## 2011-06-24 NOTE — Telephone Encounter (Signed)
Pt has not received Ativan from 05/11/11 phone note. Pt is out of med now and does not want med called to local pharmacy. Pt concerned about it being habit forming and request appt to discuss with Dr Reece Agar. Scheduled appt 06/25/11 at 2:45.

## 2011-06-25 ENCOUNTER — Encounter: Payer: Self-pay | Admitting: Family Medicine

## 2011-06-25 ENCOUNTER — Ambulatory Visit (INDEPENDENT_AMBULATORY_CARE_PROVIDER_SITE_OTHER): Payer: Medicare Other | Admitting: Family Medicine

## 2011-06-25 VITALS — BP 144/82 | HR 76 | Temp 99.6°F | Wt 159.8 lb

## 2011-06-25 DIAGNOSIS — F329 Major depressive disorder, single episode, unspecified: Secondary | ICD-10-CM

## 2011-06-25 DIAGNOSIS — G4700 Insomnia, unspecified: Secondary | ICD-10-CM | POA: Insufficient documentation

## 2011-06-25 MED ORDER — LORAZEPAM 2 MG PO TABS: 2.0000 mg | ORAL_TABLET | Freq: Every evening | ORAL | Status: AC | PRN

## 2011-06-25 MED ORDER — INSULIN GLARGINE 100 UNIT/ML ~~LOC~~ SOLN: 20.0000 [IU] | SUBCUTANEOUS | Status: DC

## 2011-06-25 MED ORDER — LORAZEPAM 1 MG PO TABS: 1.0000 mg | ORAL_TABLET | Freq: Every evening | ORAL | Status: DC | PRN

## 2011-06-25 MED ORDER — MIRTAZAPINE 15 MG PO TABS: 7.5000 mg | ORAL_TABLET | Freq: Every day | ORAL | Status: DC

## 2011-06-25 MED ORDER — CITALOPRAM HYDROBROMIDE 20 MG PO TABS: 20.0000 mg | ORAL_TABLET | Freq: Every day | ORAL | Status: DC

## 2011-06-25 NOTE — Progress Notes (Signed)
  Subjective:    Patient ID: Christine Blanchard, female    DOB: 14-Mar-1928, 78 y.o.   MRN: [id number]  HPI CC: insomnia  Trouble sleeping - for at least the last year.  No caffeine after 1pm. Tried lunesta but caused bad dreams. Took ativan 1mg  per endo which helped when doubled up at night. Takes maybe 1-2 nights/wk.  sleep maintenance insomnia.  Never tried trazodone.  Calm quiet dark environment.  Watches TV prior to bedtime, turner classic movies, none violent.  No radio in bedroom.  No daytime naps.  Reads in bed.  Has already been provided handout on sleep hygiene.  Depression - on lexapro 20mg  daily.  Very expensive $200 per year.  Would like to try cheaper medicine.    Requests refill of lantus.  States sugars have been elevated, but attributes to poor sleep.  On remeron for essential tremor.  Tried doubling med to see if would cause sedation  Review of Systems per HPI   Objective:   Physical Exam  Nursing note and vitals reviewed. Constitutional: She appears well-developed and well-nourished. No distress.       Nasal cannula in place  Psychiatric: She has a normal mood and affect.       Bright affect       Assessment & Plan:

## 2011-06-25 NOTE — Assessment & Plan Note (Signed)
Prior titration to 10mg  lexapro failed however pt interested in trying cheaper alternative that will be on formulary. Discussed celexa alternative, again may notice deterioration given prior experience and if that happens to restart lexapro and let me know. discussed may stop lexapro and start celexa right away.

## 2011-06-25 NOTE — Assessment & Plan Note (Signed)
Longstanding sleep maintenance insomnia, rec bed only for sleep, rec short trials of sleep and then to get up to avoid insomnia cycle. Discussed sleep hygiene measures. Discussed hesitance to regularly use ativan 2/2 resp depression. Did provide with PRN ativan, advised to use sparingly. Discussed inverse relationship between remeron and sedation - will decrease remeron to 7.5mg  and see if helps insomnia as well as still controls tremor adequately. If not improved, consider trazodone in place of ativan.

## 2011-06-25 NOTE — Patient Instructions (Signed)
Will change to celexa (citalopram) at 20mg  daily (highest dose we can go on this).  If depression getting worse, restart lexapro (escitalopram). Remember bedtime routine  Use bed for only sleeping.  If not falling asleep after 10-49min, get up and do something else then retry sleep later. Ativan refilled today 2mg  to use as needed for sleep. Decrease remeron to 7.5mg  at bedtime to see if improvement in sedation and control of tremors, both at once. We will see how sleep is doing next visit.

## 2011-07-01 ENCOUNTER — Telehealth: Payer: Self-pay | Admitting: *Deleted

## 2011-07-01 NOTE — Telephone Encounter (Signed)
Lantus clarification paperwork in your IN box

## 2011-07-02 NOTE — Telephone Encounter (Signed)
Clarified with patient that she uses the pen and she doesn't want to give it up!

## 2011-07-02 NOTE — Telephone Encounter (Signed)
Noted. Filled out accordingly and placed in Kim's box.

## 2011-07-02 NOTE — Telephone Encounter (Signed)
Can we double check with her which lantus she takes - vial or pen?

## 2011-07-03 ENCOUNTER — Encounter: Payer: Self-pay | Admitting: Pulmonary Disease

## 2011-07-03 DIAGNOSIS — Z5189 Encounter for other specified aftercare: Secondary | ICD-10-CM | POA: Diagnosis not present

## 2011-07-03 DIAGNOSIS — J40 Bronchitis, not specified as acute or chronic: Secondary | ICD-10-CM | POA: Diagnosis not present

## 2011-07-05 NOTE — Telephone Encounter (Signed)
Form faxed

## 2011-07-12 DIAGNOSIS — Z5189 Encounter for other specified aftercare: Secondary | ICD-10-CM | POA: Diagnosis not present

## 2011-07-12 DIAGNOSIS — J40 Bronchitis, not specified as acute or chronic: Secondary | ICD-10-CM | POA: Diagnosis not present

## 2011-07-16 ENCOUNTER — Encounter: Payer: Self-pay | Admitting: Pulmonary Disease

## 2011-07-22 ENCOUNTER — Telehealth: Payer: Self-pay | Admitting: Family Medicine

## 2011-07-22 ENCOUNTER — Ambulatory Visit (INDEPENDENT_AMBULATORY_CARE_PROVIDER_SITE_OTHER): Payer: Medicare Other | Admitting: Family Medicine

## 2011-07-22 ENCOUNTER — Ambulatory Visit (INDEPENDENT_AMBULATORY_CARE_PROVIDER_SITE_OTHER)
Admission: RE | Admit: 2011-07-22 | Discharge: 2011-07-22 | Disposition: A | Discharge status: Home / Self Care | Payer: Medicare Other | Source: Ambulatory Visit | Attending: Family Medicine | Admitting: Family Medicine

## 2011-07-22 ENCOUNTER — Encounter: Payer: Self-pay | Admitting: Family Medicine

## 2011-07-22 VITALS — BP 120/60 | HR 64 | Wt 157.0 lb

## 2011-07-22 DIAGNOSIS — W1800XA Striking against unspecified object with subsequent fall, initial encounter: Secondary | ICD-10-CM | POA: Insufficient documentation

## 2011-07-22 DIAGNOSIS — M549 Dorsalgia, unspecified: Secondary | ICD-10-CM

## 2011-07-22 DIAGNOSIS — W1809XA Striking against other object with subsequent fall, initial encounter: Secondary | ICD-10-CM

## 2011-07-22 DIAGNOSIS — R079 Chest pain, unspecified: Secondary | ICD-10-CM

## 2011-07-22 MED ORDER — TRAMADOL HCL 50 MG PO TABS: 50.0000 mg | ORAL_TABLET | Freq: Three times a day (TID) | ORAL | Status: AC | PRN

## 2011-07-22 MED ORDER — TRAMADOL HCL 50 MG PO TABS: 50.0000 mg | ORAL_TABLET | Freq: Three times a day (TID) | ORAL | Status: DC | PRN

## 2011-07-22 NOTE — Telephone Encounter (Signed)
Noted. Thanks. Very pleasant patient.

## 2011-07-22 NOTE — Patient Instructions (Addendum)
Nice to meet you. You probably fractured or bruised ribs. Continue tylenol and we are adding tramadol as needed. We will call you with your xray results.

## 2011-07-22 NOTE — Progress Notes (Signed)
  Subjective:    Patient ID: Christine Blanchard, female    DOB: 1928-07-08, 78 y.o.   MRN: [id number]  78 yo female pt of Dr. Reece Agar, new to me, here for fall.    She has a h/o severe CAD s/p CABG in 2004 w/ PCI 6 months after her bypass, repeat PCI one year later complicated by CVA with residual R weakness, recently recommended medical management only, HLD, T2DM, HTN, spinal stenosis who currently lives in independent living at [facility] in [place], also with chronic unsteady gait and worsening dyspnea now on chronic oxygen.  Presents alone today.  Drives herself.   Last week, she woke up in the middle of the night to use the bathroom. Tripped and Larey Seat struck her dresser and floor with her back and side . She is sore from the waist up to bra line on both sides of her back and bilateral ribs.  She is not on coumadin but she does take Plavix.  No LOC. No dizziness prior to fall, just lost her footing.  No worsening SOB- on continuous O2 per [place].  Does have bilateral rib pain when she moves at night or takes deep breaths.     Review of Systems Per HPI No CP     Objective:   Physical Exam  BP 120/60  Pulse 64  Wt 157 lb (71.215 kg) Constitutional: She appears well-developed and well-nourished. No distress.  Head: Normocephalic and atraumatic.  Mouth/Throat: Oropharynx is clear and moist. No oropharyngeal exudate.  Neck: Normal range of motion. Neck supple.  Cardiovascular: Normal rate, regular rhythm, normal heart sounds and intact distal pulses.   No murmur heard. Pulmonary/Chest: Effort normal and breath sounds normal. No respiratory distress. She has no wheezes. She has no rales.  TTP over bilateral inferior rib cages, some faint echymoses Musculoskeletal: Trace LE bilaterally      Assessment & Plan:   1. Fall against object  DG Chest 2 View   New- likely bruised or fractured ribs. Codeine makes her nauseated (on allergy list). Will get CXR given her h/o lung disease although  advised pt that often cannot see rib fx on cxr. Continue tylenol, add tramadol as needed for pain. She will let us know if symptoms worsen. The patient indicates understanding of these issues and agrees with the plan.

## 2011-07-22 NOTE — Telephone Encounter (Signed)
Caller: [name]/Patient; PCP: Eustaquio Boyden; CB#: [phone]; ; ; Call regarding Patient Christine Blanchard Last Week and Seems To Think She May Have Cracked Some Ribs; She fell getting out of bed and struck her dresser and floor . She is sore from the waist up to bra line on both sides of her back and bilateral ribs.  + short of breath but is already on oxygen.  She states it is sore with palpation and movement.  She has slept but it is very uncomfortable, wears 02.  Treating her self with Tylenol and ASA. Emergent s/sx ruled out Back Symptoms Protocol with exception of "Significant Trauma and has been mobile since injury but is having back pain " . See in 24 hours. Home care advised provided. Appt today at [facility] with Dr. Clifton Custard at 11:45 , 07/22/11.  Patient in agreement.

## 2011-07-23 ENCOUNTER — Telehealth: Payer: Self-pay | Admitting: *Deleted

## 2011-07-23 NOTE — Telephone Encounter (Signed)
[facility] is on your desk.  They are asking for clarification on tramadol script.

## 2011-07-23 NOTE — Telephone Encounter (Signed)
That was [facility] in error. Please disregard- was resent to local pharmacy.

## 2011-07-23 NOTE — Telephone Encounter (Signed)
Form faxed [facility] to [facility] with note to disregard script.

## 2011-07-28 ENCOUNTER — Encounter: Payer: Self-pay | Admitting: Cardiology

## 2011-08-04 ENCOUNTER — Other Ambulatory Visit: Payer: Self-pay | Admitting: Family Medicine

## 2011-08-04 DIAGNOSIS — E119 Type 2 diabetes mellitus without complications: Secondary | ICD-10-CM

## 2011-08-04 DIAGNOSIS — E785 Hyperlipidemia, unspecified: Secondary | ICD-10-CM

## 2011-08-09 ENCOUNTER — Other Ambulatory Visit (INDEPENDENT_AMBULATORY_CARE_PROVIDER_SITE_OTHER): Payer: Medicare Other

## 2011-08-09 ENCOUNTER — Other Ambulatory Visit: Payer: Self-pay | Admitting: Cardiovascular Disease

## 2011-08-09 DIAGNOSIS — E785 Hyperlipidemia, unspecified: Secondary | ICD-10-CM | POA: Diagnosis not present

## 2011-08-09 DIAGNOSIS — E119 Type 2 diabetes mellitus without complications: Secondary | ICD-10-CM | POA: Diagnosis not present

## 2011-08-09 LAB — BASIC METABOLIC PANEL
Calcium: 8.9 mg/dL (ref 8.4–10.5)
GFR: 55.88 mL/min — ABNORMAL LOW (ref >60.00)
Potassium: 4.3 mEq/L (ref 3.5–5.1)
Sodium: 142 mEq/L (ref 135–145)

## 2011-08-09 LAB — LIPID PANEL
HDL: 53.4 mg/dL (ref >39.00)
Total CHOL/HDL Ratio: 2
VLDL: 25.4 mg/dL (ref 0.0–40.0)

## 2011-08-09 MED ORDER — CLOPIDOGREL BISULFATE 75 MG PO TABS: 75.0000 mg | ORAL_TABLET | Freq: Every day | ORAL | Status: DC

## 2011-08-09 NOTE — Telephone Encounter (Signed)
Also needs 30 day sent [facility] on [address]. Pt will be out of meds before [facility] sends theirs out

## 2011-08-09 NOTE — Telephone Encounter (Signed)
[facility]rel [facility] and [facility] supply [facility].

## 2011-08-12 ENCOUNTER — Encounter: Payer: Self-pay | Admitting: Family Medicine

## 2011-08-12 ENCOUNTER — Ambulatory Visit (INDEPENDENT_AMBULATORY_CARE_PROVIDER_SITE_OTHER): Payer: Medicare Other | Admitting: Family Medicine

## 2011-08-12 VITALS — BP 122/64 | HR 62 | Temp 97.6°F | Ht 65.0 in | Wt 156.8 lb

## 2011-08-12 DIAGNOSIS — I10 Essential (primary) hypertension: Secondary | ICD-10-CM

## 2011-08-12 DIAGNOSIS — E119 Type 2 diabetes mellitus without complications: Secondary | ICD-10-CM | POA: Diagnosis not present

## 2011-08-12 MED ORDER — METFORMIN HCL ER 500 MG PO TB24: 1000.0000 mg | ORAL_TABLET | Freq: Every day | ORAL | Status: DC

## 2011-08-12 MED ORDER — RABEPRAZOLE SODIUM 20 MG PO TBEC: 20.0000 mg | DELAYED_RELEASE_TABLET | Freq: Every day | ORAL | Status: DC

## 2011-08-12 NOTE — Progress Notes (Signed)
  Subjective:    Patient ID: Christine Blanchard, female    DOB: 20-Oct-1928, 78 y.o.   MRN: [id number]  HPI CC: 3 mo f/u  Ms [name] presents today for follow up.  Requests refills of aciphex and change in fortamet to more generic.  DM - check sugars bid - running well according to patient.  Thinks eating too large servings.  States last exam was 04/2011.  Denies lows or hypoglycemic sxs.  Lowest sugar has been 80.  Highest sugar has been 180.  Wt Readings from Last 3 Encounters:  08/12/11 156 lb 12 oz (71.101 kg)  07/22/11 157 lb (71.215 kg)  06/25/11 159 lb 12 oz (72.462 kg)  Body mass index is 26.08 kg/(m^2).  Lab Results  Component Value Date   HGBA1C 7.2* 08/09/2011   HTN - compliant and stable with meds.   Review of Systems Per HPI    Objective:   Physical Exam  Nursing note and vitals reviewed. Constitutional: She appears well-developed and well-nourished. No distress.  HENT:  Head: Normocephalic and atraumatic.  Right Ear: External ear normal.  Left Ear: External ear normal.  Nose: Nose normal.  Mouth/Throat: Oropharynx is clear and moist. No oropharyngeal exudate.  Eyes: Conjunctivae and EOM are normal. Pupils are equal, round, and reactive to light. No scleral icterus.  Neck: Normal range of motion. Neck supple.  Cardiovascular: Normal rate, regular rhythm, normal heart sounds and intact distal pulses.   No murmur heard. Pulmonary/Chest: Effort normal and breath sounds normal. No respiratory distress. She has no wheezes. She has no rales.  Musculoskeletal: She exhibits no edema.       Right anterior shin scabbed, no significant drainage Diabetic foot exam: Normal inspection No skin breakdown No calluses  Normal DP/PT pulses Normal sensation to light tough and monofilament Nails normal  Lymphadenopathy:    She has no cervical adenopathy.  Skin: Skin is warm and dry. No rash noted.  Psychiatric: She has a normal mood and affect.       Assessment & Plan:

## 2011-08-12 NOTE — Patient Instructions (Addendum)
Good to see you today, call us with questions. I've refilled metformin and aciphex for 1 year. Sugar did creep up a bit - we will continue to watch sugars.  No changes today.  A1c was 7.2%. Return in 4 months for follow up, prior for blood work.

## 2011-08-12 NOTE — Assessment & Plan Note (Signed)
Chronic. A1c bumped up to 7.2%, however her goal is likely closer to <8% given comorbidities and recently endorsed low sugars (symptomatic with cbg 80). Continue meds for now (no changes), reassess in 3 mo.

## 2011-08-12 NOTE — Assessment & Plan Note (Signed)
Chronic, stable. Continue meds. 

## 2011-08-30 ENCOUNTER — Telehealth: Payer: Self-pay | Admitting: Family Medicine

## 2011-08-30 NOTE — Telephone Encounter (Signed)
Please notify I did receive her message regarding metoprolol and sugar control.  that is a good point.  We may discuss a change in med at next f/u visit if sugars stay elevated

## 2011-08-30 NOTE — Telephone Encounter (Signed)
Informed patient

## 2011-09-08 ENCOUNTER — Other Ambulatory Visit: Payer: Self-pay | Admitting: Dermatology

## 2011-09-08 DIAGNOSIS — D0460 Carcinoma in situ of skin of unspecified upper limb, including shoulder: Secondary | ICD-10-CM | POA: Diagnosis not present

## 2011-09-08 DIAGNOSIS — L570 Actinic keratosis: Secondary | ICD-10-CM | POA: Diagnosis not present

## 2011-09-08 DIAGNOSIS — L821 Other seborrheic keratosis: Secondary | ICD-10-CM | POA: Diagnosis not present

## 2011-09-08 DIAGNOSIS — C44621 Squamous cell carcinoma of skin of unspecified upper limb, including shoulder: Secondary | ICD-10-CM | POA: Diagnosis not present

## 2011-09-09 ENCOUNTER — Ambulatory Visit: Payer: Medicare Other | Admitting: Cardiovascular Disease

## 2011-09-10 ENCOUNTER — Ambulatory Visit (INDEPENDENT_AMBULATORY_CARE_PROVIDER_SITE_OTHER): Payer: Medicare Other | Admitting: Cardiovascular Disease

## 2011-09-10 ENCOUNTER — Encounter: Payer: Self-pay | Admitting: Cardiovascular Disease

## 2011-09-10 VITALS — BP 110/60 | HR 78 | Ht 65.0 in | Wt 155.8 lb

## 2011-09-10 DIAGNOSIS — I10 Essential (primary) hypertension: Secondary | ICD-10-CM

## 2011-09-10 DIAGNOSIS — I2510 Atherosclerotic heart disease of native coronary artery without angina pectoris: Secondary | ICD-10-CM

## 2011-09-10 DIAGNOSIS — R079 Chest pain, unspecified: Secondary | ICD-10-CM

## 2011-09-10 DIAGNOSIS — J9610 Chronic respiratory failure, unspecified whether with hypoxia or hypercapnia: Secondary | ICD-10-CM | POA: Diagnosis not present

## 2011-09-10 DIAGNOSIS — J9611 Chronic respiratory failure with hypoxia: Secondary | ICD-10-CM

## 2011-09-10 DIAGNOSIS — I5189 Other ill-defined heart diseases: Secondary | ICD-10-CM

## 2011-09-10 DIAGNOSIS — I519 Heart disease, unspecified: Secondary | ICD-10-CM | POA: Diagnosis not present

## 2011-09-10 NOTE — Progress Notes (Signed)
Patient ID: Christine Blanchard, female    DOB: May 08, 1928, 78 y.o.   MRN: [id number]  HPI Comments: Ms. Feimster is a very pleasant 78 year old woman with history of coronary artery disease, bypass surgery in 2004, hyperlipidemia, PCI 6 months after her bypass, repeat PCI one year later (She does report having a stroke after her cardiac catheterization), diabetes, hypertension, spinal stenosis who currently lives in a nursing home in [place], who has chronic unsteady gait  with worsening of her shortness of breath over the past year and  on chronic oxygen who presents for routine followup.  Previous cardiac CTA was ordered for atypical chest pain that showed: Patent SVG to D1 with poor distal runoff,  Occluded LIMA to LAD.  Distal LAD never visualized,  RCA and circumflex patient with multiple 50% or less calcfic Lesions,  Poor distal runoff from stented IM/LAD and small D1 supplied by SVG  She has completed physical therapy, pulmonary rehabilitation and physical therapy at home. Currently she does not do any therapy. She reports that her legs are weak at times. She describes these as "weak spells". Her edema has been stable, shortness of breath has been stable on oxygen. She does have a history of spinal stenosis with surgery on her back.  Echocardiogram performed February 2012 was essentially normal with mild TR, normal right ventricular systolic pressure Stress test in August 2011 was normal with no ischemia, normal LV function.  EKG shows Normal sinus rhythm with rate 78 beats per minute with poor R-wave progression through the anterior precordial leads, nonspecific T wave abnormality. No significant change from her prior EKG   Outpatient Encounter Prescriptions as of 09/10/2011  Medication Sig Dispense Refill  . albuterol (PROVENTIL,VENTOLIN) 90 MCG/ACT inhaler Inhale 2 puffs into the lungs every 6 (six) hours as needed for wheezing.  3 each  3  . aspirin 325 MG tablet Take 325 mg by mouth daily.         Marland Kitchen atorvastatin (LIPITOR) 20 MG tablet Take 1 tablet (20 mg total) by mouth Nightly.  90 tablet  3  . Casanthranol-Docusate Sodium 30-100 MG CAPS as needed.        . citalopram (CELEXA) 20 MG tablet Take 1 tablet (20 mg total) by mouth daily.  30 tablet  11  . clopidogrel (PLAVIX) 75 MG tablet Take 1 tablet (75 mg total) by mouth daily.  30 tablet  1  . ergocalciferol (VITAMIN D2) 50000 UNITS capsule Take 1 capsule (50,000 Units total) by mouth once a week.  4 capsule  3  . ezetimibe (ZETIA) 10 MG tablet Take 1 tablet (10 mg total) by mouth daily.  90 tablet  3  . fenofibrate micronized (ANTARA) 130 MG capsule Take 130 mg by mouth daily.        . fluticasone (FLONASE) 50 MCG/ACT nasal spray 2 sprays by Nasal route daily.        . furosemide (LASIX) 40 MG tablet 1 daily every tues, thurs, and saturday      . insulin glargine (LANTUS) 100 UNIT/ML injection Inject 20 Units into the skin every morning.  10 mL  11  . Insulin Pen Needle (PEN NEEDLES 31GX5/16") 31G X 8 MM MISC Use to check blood sugar daily.  Dx:250.00      . isosorbide mononitrate (IMDUR) 60 MG 24 hr tablet 1 and 1/2 two times a day  270 tablet  3  . loratadine (CLARITIN) 10 MG tablet Take 1 tablet (10 mg total) by mouth  daily.  90 tablet  1  . LORazepam (ATIVAN) 2 MG tablet Take 1 mg by mouth at bedtime as needed. insomnia      . metFORMIN (GLUCOPHAGE-XR) 500 MG 24 hr tablet Take 2 tablets (1,000 mg total) by mouth at bedtime.  180 tablet  3  . metoprolol tartrate (LOPRESSOR) 25 MG tablet Take 1 tablet (25 mg total) by mouth 2 (two) times daily.  180 tablet  3  . mirtazapine (REMERON) 15 MG tablet Take 7.5 mg by mouth at bedtime.      . Multiple Vitamin (MULTIVITAMIN) capsule Take 1 capsule by mouth daily.        . nitroGLYCERIN (NITROSTAT) 0.4 MG SL tablet Place 0.4 mg under the tongue as needed.        . potassium chloride SA (K-DUR,KLOR-CON) 20 MEQ tablet Take 1/2 tablet daily only when you take lasix      . prednisoLONE  sodium phosphate (INFLAMASE FORTE) 1 % ophthalmic solution Place 1 drop into both eyes 2 (two) times daily.        . RABEprazole (ACIPHEX) 20 MG tablet Take 1 tablet (20 mg total) by mouth daily.  90 tablet  3  . ranolazine (RANEXA) 1000 MG SR tablet Take 1 tablet (1,000 mg total) by mouth 2 (two) times daily.  180 tablet  3  . sitaGLIPtan (JANUVIA) 100 MG tablet Take 100 mg by mouth daily.        Marland Kitchen DISCONTD: Insulin Pen Needle (PEN NEEDLES 31GX5/16") 31G X 8 MM MISC Use to check blood sugar three times daily.  Dx:250.00  100 each  12    Review of Systems  Constitutional: Negative for fever and unexpected weight change.  HENT: Negative.   Eyes: Negative.   Respiratory: Positive for shortness of breath. Negative for apnea, cough, choking, chest tightness, wheezing and stridor.   Cardiovascular: Negative.   Gastrointestinal: Negative.   Musculoskeletal: Positive for gait problem.       Profound leg weakness  Skin: Negative.   Neurological: Positive for weakness.  Hematological: Negative.   Psychiatric/Behavioral: Negative.   All other systems reviewed and are negative.    BP 110/60  Pulse 78  Ht 5\' 5"  (1.651 m)  Wt 155 lb 12 oz (70.648 kg)  BMI 25.92 kg/m2  Physical Exam  Nursing note and vitals reviewed. Constitutional: She is oriented to person, place, and time. She appears well-developed and well-nourished.       On chronic oxygen nasal cannula  HENT:  Head: Normocephalic.  Nose: Nose normal.  Mouth/Throat: Oropharynx is clear and moist.  Eyes: Conjunctivae are normal. Pupils are equal, round, and reactive to light.  Neck: Normal range of motion. Neck supple. No JVD present.  Cardiovascular: Normal rate, regular rhythm, S1 normal, S2 normal and intact distal pulses.  Exam reveals no gallop and no friction rub.   Murmur heard.  Crescendo systolic murmur is present with a grade of 2/6       Trace edema  , worse on the left lower extremity than the right  Pulmonary/Chest:  Effort normal. No respiratory distress. She has decreased breath sounds. She has no wheezes. She has no rales. She exhibits no tenderness.  Abdominal: Soft. Bowel sounds are normal. She exhibits no distension. There is no tenderness.  Musculoskeletal: Normal range of motion. She exhibits edema. She exhibits no tenderness.  Lymphadenopathy:    She has no cervical adenopathy.  Neurological: She is alert and oriented to person, place, and time. Coordination  normal.  Skin: Skin is warm and dry. No rash noted. No erythema.  Psychiatric: She has a normal mood and affect. Her behavior is normal. Judgment and thought content normal.         Assessment and Plan

## 2011-09-10 NOTE — Assessment & Plan Note (Addendum)
No clear documentation of elevated right heart pressures. Renal function is essentially normal. I suggested if she has worsening shortness of breath, that she take extra diuretic to see if this helps.

## 2011-09-10 NOTE — Assessment & Plan Note (Signed)
Blood pressure is well controlled on today's visit. No changes made to the medications. 

## 2011-09-10 NOTE — Assessment & Plan Note (Signed)
She appears relatively stable. On chronic oxygen.

## 2011-09-10 NOTE — Patient Instructions (Addendum)
Decrease the isosorbide to 60 mg twice a day If you feel weak, eating something, sit down  Please take extra lasix for increasing shortness of breath Ok to take daily until breathing gets better  Please call us if you have new issues that need to be addressed before your next appt.  Your physician wants you to follow-up in: 6 months.  You will receive a reminder letter in the mail two months in advance. If you don't receive a letter, please call our office to schedule the follow-up appointment.

## 2011-09-10 NOTE — Assessment & Plan Note (Signed)
Severe three-vessel disease. Currently with no symptoms of angina. We'll need to watch for worsening shortness of breath.

## 2011-09-13 ENCOUNTER — Other Ambulatory Visit: Payer: Self-pay | Admitting: *Deleted

## 2011-09-13 MED ORDER — LORAZEPAM 2 MG PO TABS: 1.0000 mg | ORAL_TABLET | Freq: Every evening | ORAL | Status: DC | PRN

## 2011-09-13 NOTE — Telephone Encounter (Signed)
plz phone in. 

## 2011-09-13 NOTE — Telephone Encounter (Signed)
Rx called in as directed.   

## 2011-10-07 ENCOUNTER — Other Ambulatory Visit: Payer: Self-pay | Admitting: Family Medicine

## 2011-10-07 ENCOUNTER — Other Ambulatory Visit: Payer: Self-pay

## 2011-10-07 MED ORDER — CITALOPRAM HYDROBROMIDE 20 MG PO TABS: 20.0000 mg | ORAL_TABLET | Freq: Every day | ORAL | Status: DC

## 2011-10-07 NOTE — Telephone Encounter (Signed)
Pt [facility] instead of # 30. Pt notified while on phone done,.

## 2011-10-08 NOTE — Telephone Encounter (Signed)
Do you want patient to continue weekly supplement?

## 2011-11-03 ENCOUNTER — Ambulatory Visit (INDEPENDENT_AMBULATORY_CARE_PROVIDER_SITE_OTHER): Payer: Medicare Other | Admitting: Pulmonary Disease

## 2011-11-03 ENCOUNTER — Encounter: Payer: Self-pay | Admitting: Pulmonary Disease

## 2011-11-03 VITALS — BP 104/60 | HR 76 | Temp 98.1°F | Ht 65.0 in | Wt 154.1 lb

## 2011-11-03 DIAGNOSIS — R0602 Shortness of breath: Secondary | ICD-10-CM | POA: Diagnosis not present

## 2011-11-03 DIAGNOSIS — R5383 Other fatigue: Secondary | ICD-10-CM

## 2011-11-03 DIAGNOSIS — R5381 Other malaise: Secondary | ICD-10-CM | POA: Diagnosis not present

## 2011-11-03 DIAGNOSIS — Z23 Encounter for immunization: Secondary | ICD-10-CM | POA: Diagnosis not present

## 2011-11-03 DIAGNOSIS — J9611 Chronic respiratory failure with hypoxia: Secondary | ICD-10-CM

## 2011-11-03 DIAGNOSIS — J9610 Chronic respiratory failure, unspecified whether with hypoxia or hypercapnia: Secondary | ICD-10-CM

## 2011-11-03 LAB — CBC WITH DIFFERENTIAL/PLATELET
Basophils Relative: 0.3 % (ref 0.0–3.0)
Eosinophils Absolute: 0.1 10*3/uL (ref 0.0–0.7)
Eosinophils Relative: 1.9 % (ref 0.0–5.0)
Hemoglobin: 11.4 g/dL — ABNORMAL LOW (ref 12.0–15.0)
Lymphocytes Relative: 34.3 % (ref 12.0–46.0)
Monocytes Relative: 11.9 % (ref 3.0–12.0)
Neutrophils Relative %: 51.6 % (ref 43.0–77.0)
RBC: 3.93 Mil/uL (ref 3.87–5.11)
WBC: 7.1 10*3/uL (ref 4.5–10.5)

## 2011-11-03 MED ORDER — FLUTICASONE PROPIONATE 50 MCG/ACT NA SUSP: 2.0000 | Freq: Every day | NASAL | Status: DC

## 2011-11-03 NOTE — Assessment & Plan Note (Signed)
She uses and benefits from 2 L of oxygen continuously. She is to stay at this dose.

## 2011-11-03 NOTE — Assessment & Plan Note (Signed)
Mrs. Bonello does not show signs today of worsening hypoxemic respiratory failure and she does not look overtly volume overloaded. I think that the fatigue and dyspnea that she's been experiencing lately is related to her coronary disease. She states that it feels very similar to the symptoms she had prior to her bypass surgery.  There is really not a role for bronchodilators as she's never really responded to this.  I explained to her that I worry that some of her fatigue during the day and possibly shortness of breath could be related to obstructive sleep apnea and perhaps chronic hypercapnic respiratory failure secondary to her restrictive lung disease. At this point she would prefer not to undergo a sleep study or blood gas testing as she has "too many other things going on".  Plan: -We will check a CBC to look for anemia as a potential cause of her shortness of breath -Continue using oxygen as directed -Continue using cardiac meds as directed by Dr. Mariah Milling. -If she changes her mind we will order a sleep study

## 2011-11-03 NOTE — Progress Notes (Signed)
Subjective:    Patient ID: Christine Blanchard, female    DOB: [birth year], 78 y.o.   MRN: [id number]  Synopsis: 78 y/o female with coronary artery disease and chronic angina who has been followed by [facility] in [facility] (by Dr. Delton Coombes) for years and [facility] in [place] since 02/2011 for chronic dyspnea and hypoxemic respiratory failure.  Objectively she has both restrictive and obstructive disease and low MIP and MEP. She also likely has some degree of chronic aspiration.  She has severe coronary artery disease with chronic angina.  HPI 03/10/11 ROV -- Christine Blanchard reports to me that she is doing "great". She states that she just finished two months of physical therapy and feels better after doing so.  Her shortness of breath has been manageable lately.  She still notices that she chokes on some food, specifically "crumbly" foods like popcorn and cornbread.  Swelling has improved some.    06/14/11 ROV-- Christine Blanchard says that she likes pulmonary rehab and is able to exercise more than when she started.  She says that she really likes the staff there and says they are pushing her more. She feels physically stronger but is tired at home afterwards.  Her chronic angina is still limiting her somewhat.  She has days where she takes multiple nitroglyerin tabs.  She has not had fever, chills, or cough.  She is noting difficulty with sleeping.  Specifically she can't fall asleep after lying down until about 3AM.  She does not use caffeinated beverages other than some tea with lunch.  Her swelling has improved on her current dose of lasix.  11/03/11 ROV -- Christine Blanchard states that she had some breathing trouble a few weeks back when the weather was hot. Specifically she had itching eyes and sinus congestion. She took Zyrtec in addition to the Claritin and said that it made this better. While the sinus symptoms and itchy eyes have improved she states that she feels that she feels generally weaker than when I last saw her.  Specifically she says she has difficulty with activities of daily living around the house such as making her bed or bringing in groceries. She does not have a new cough. She does have dyspnea when performing these activities and associated chest pain. She states that her angina has been more frequent and longer lasting. She has been advised by Dr. Mariah Milling to continue taking nitrates for pain. She has not been swelling in her legs more.   Review of Systems  Constitutional: Negative for fever, chills and unexpected weight change.  Respiratory: Positive for shortness of breath. Negative for cough and choking.   Cardiovascular: Positive for chest pain. Negative for leg swelling.       Objective:   Physical Exam   Filed Vitals:   11/03/11 1338  BP: 104/60  Pulse: 76  Temp: 98.1 F (36.7 C)  TempSrc: Oral  Height: 5\' 5"  (1.651 m)  Weight: 154 lb 1.9 oz (69.908 kg)  SpO2: 97%    Gen: well appearing, no acute distress HEENT: NCAT, PERRL, EOMi, OP clear, Neck: supple without masses PULM: few crackles in bases, otherwise clear CV: RRR, slight systolic murmur, no JVD AB: BS+, soft, nontender, no hsm Ext: warm, trace ankle edema, no clubbing, no cyanosis   2011 PFT's: FVC Value: 1.56 L/min Pred: 2.64 L/min % Pred: 59 %  FEV1 Value: 1.28 L Pred: 1.96 L % Pred: 65 %  FEV1/FVC Value: 82 % Pred: 74 % % Pred: 111 %  FEF 25-75 Value: 1.33 L/min Pred: 1.44 L/min % Pred: 92 %  MIP (- ) 40% pred, MEP 46% pred  11/2010 CTA cardiac (with lung windows, my read): R base peripheral/subpleural scarring, scattered GGO greatest in left lower     Assessment & Plan:   DYSPNEA Christine Blanchard does not show signs today of worsening hypoxemic respiratory failure and she does not look overtly volume overloaded. I think that the fatigue and dyspnea that she's been experiencing lately is related to her coronary disease. She states that it feels very similar to the symptoms she had prior to her bypass  surgery.  There is really not a role for bronchodilators as she's never really responded to this.  I explained to her that I worry that some of her fatigue during the day and possibly shortness of breath could be related to obstructive sleep apnea and perhaps chronic hypercapnic respiratory failure secondary to her restrictive lung disease. At this point she would prefer not to undergo a sleep study or blood gas testing as she has "too many other things going on".  Plan: -We will check a CBC to look for anemia as a potential cause of her shortness of breath -Continue using oxygen as directed -Continue using cardiac meds as directed by Dr. Mariah Milling. -If she changes her mind we will order a sleep study  Chronic hypoxemic respiratory failure She uses and benefits from 2 L of oxygen continuously. She is to stay at this dose.    Updated Medication List Outpatient Encounter Prescriptions as of 11/03/2011  Medication Sig Dispense Refill  . albuterol (PROVENTIL,VENTOLIN) 90 MCG/ACT inhaler Inhale 2 puffs into the lungs every 6 (six) hours as needed for wheezing.  3 each  3  . aspirin 325 MG tablet Take 325 mg by mouth daily.        Marland Kitchen atorvastatin (LIPITOR) 20 MG tablet Take 1 tablet (20 mg total) by mouth Nightly.  90 tablet  3  . Casanthranol-Docusate Sodium 30-100 MG CAPS as needed.        . citalopram (CELEXA) 20 MG tablet Take 1 tablet (20 mg total) by mouth daily.  90 tablet  2  . clopidogrel (PLAVIX) 75 MG tablet Take 1 tablet (75 mg total) by mouth daily.  30 tablet  1  . ezetimibe (ZETIA) 10 MG tablet Take 1 tablet (10 mg total) by mouth daily.  90 tablet  3  . fenofibrate micronized (ANTARA) 130 MG capsule Take 130 mg by mouth daily.        . fluticasone (FLONASE) 50 MCG/ACT nasal spray 2 sprays by Nasal route daily.        . furosemide (LASIX) 40 MG tablet 1 daily every tues, thurs, and saturday      . insulin glargine (LANTUS) 100 UNIT/ML injection Inject 20 Units into the skin every  morning.  10 mL  11  . Insulin Pen Needle (PEN NEEDLES 31GX5/16") 31G X 8 MM MISC Use to check blood sugar daily.  Dx:250.00      . isosorbide mononitrate (IMDUR) 60 MG 24 hr tablet 1 tablet two times a day      . loratadine (CLARITIN) 10 MG tablet Take 1 tablet (10 mg total) by mouth daily.  90 tablet  1  . LORazepam (ATIVAN) 2 MG tablet Take 0.5 tablets (1 mg total) by mouth at bedtime as needed. insomnia  30 tablet  0  . metFORMIN (GLUCOPHAGE-XR) 500 MG 24 hr tablet Take 2 tablets (1,000 mg total) by  mouth at bedtime.  180 tablet  3  . metoprolol tartrate (LOPRESSOR) 25 MG tablet Take 1 tablet (25 mg total) by mouth 2 (two) times daily.  180 tablet  3  . mirtazapine (REMERON) 15 MG tablet Take 7.5 mg by mouth at bedtime.      . Multiple Vitamin (MULTIVITAMIN) capsule Take 1 capsule by mouth daily.        . nitroGLYCERIN (NITROSTAT) 0.4 MG SL tablet Place 0.4 mg under the tongue as needed.        . potassium chloride SA (K-DUR,KLOR-CON) 20 MEQ tablet Take 1/2 tablet daily only when you take lasix      . prednisoLONE sodium phosphate (INFLAMASE FORTE) 1 % ophthalmic solution Place 1 drop into both eyes 2 (two) times daily.        . RABEprazole (ACIPHEX) 20 MG tablet Take 1 tablet (20 mg total) by mouth daily.  90 tablet  3  . ranolazine (RANEXA) 1000 MG SR tablet Take 1 tablet (1,000 mg total) by mouth 2 (two) times daily.  180 tablet  3  . sitaGLIPtan (JANUVIA) 100 MG tablet Take 100 mg by mouth daily.        . Vitamin D, Ergocalciferol, (DRISDOL) 50000 UNITS CAPS TAKE 1 CAPSULE BY MOUTH ONCE A WEEK  13 capsule  3  . DISCONTD: isosorbide mononitrate (IMDUR) 60 MG 24 hr tablet 1 and 1/2 two times a day  270 tablet  3

## 2011-11-03 NOTE — Patient Instructions (Signed)
We will let you know if your hemoglobin level is low. We will give you a flu shot today Keep using your oxygen as needed We will see you back in 3-4 months or sooner if needed

## 2011-11-05 ENCOUNTER — Telehealth: Payer: Self-pay | Admitting: Pulmonary Disease

## 2011-11-05 NOTE — Telephone Encounter (Signed)
Pt returned call.  Holly D Pryor ° °

## 2011-11-05 NOTE — Telephone Encounter (Signed)
Notes Recorded by Lupita Leash, MD on 11/05/2011 at 9:57 AM L,  Please let Ms. Dollens know her CBC was OK  Thanks, B      ATC pt NA no option to leave VM wcb

## 2011-11-05 NOTE — Telephone Encounter (Signed)
I spoke with patient about results and she verbalized understanding and had no questions 

## 2011-11-08 ENCOUNTER — Telehealth: Payer: Self-pay | Admitting: Pulmonary Disease

## 2011-11-08 MED ORDER — LORATADINE 10 MG PO TABS: 10.0000 mg | ORAL_TABLET | Freq: Every day | ORAL | Status: DC

## 2011-11-08 NOTE — Telephone Encounter (Signed)
[facility] Nothing further was needed 

## 2011-11-11 ENCOUNTER — Other Ambulatory Visit: Payer: Self-pay | Admitting: *Deleted

## 2011-11-11 NOTE — Telephone Encounter (Signed)
Ok to refill? Mail order form in your IN box from [facility] for completion.

## 2011-11-12 MED ORDER — LORAZEPAM 2 MG PO TABS: 1.0000 mg | ORAL_TABLET | Freq: Every evening | ORAL | Status: DC | PRN

## 2011-11-12 NOTE — Telephone Encounter (Signed)
Form and Rx faxed

## 2011-11-12 NOTE — Telephone Encounter (Signed)
Printed and placed in Kim's box. 

## 2011-12-05 ENCOUNTER — Other Ambulatory Visit: Payer: Self-pay | Admitting: Family Medicine

## 2011-12-05 DIAGNOSIS — E119 Type 2 diabetes mellitus without complications: Secondary | ICD-10-CM

## 2011-12-06 ENCOUNTER — Other Ambulatory Visit: Payer: Self-pay | Admitting: Cardiovascular Disease

## 2011-12-06 MED ORDER — NITROGLYCERIN 0.4 MG SL SUBL: 0.4000 mg | SUBLINGUAL_TABLET | SUBLINGUAL | Status: DC | PRN

## 2011-12-06 NOTE — Telephone Encounter (Signed)
Refilled Nitroglycerin 

## 2011-12-08 ENCOUNTER — Other Ambulatory Visit (INDEPENDENT_AMBULATORY_CARE_PROVIDER_SITE_OTHER): Payer: Medicare Other

## 2011-12-08 DIAGNOSIS — E119 Type 2 diabetes mellitus without complications: Secondary | ICD-10-CM

## 2011-12-08 LAB — BASIC METABOLIC PANEL
CO2: 32 mEq/L (ref 19–32)
Chloride: 102 mEq/L (ref 96–112)
GFR: 58.5 mL/min — ABNORMAL LOW (ref >60.00)
Glucose, Bld: 92 mg/dL (ref 70–99)
Potassium: 4.4 mEq/L (ref 3.5–5.1)
Sodium: 141 mEq/L (ref 135–145)

## 2011-12-12 ENCOUNTER — Observation Stay: Payer: Self-pay | Admitting: Internal Medicine

## 2011-12-12 DIAGNOSIS — I200 Unstable angina: Secondary | ICD-10-CM | POA: Diagnosis not present

## 2011-12-12 DIAGNOSIS — J9610 Chronic respiratory failure, unspecified whether with hypoxia or hypercapnia: Secondary | ICD-10-CM | POA: Diagnosis not present

## 2011-12-12 DIAGNOSIS — I5033 Acute on chronic diastolic (congestive) heart failure: Secondary | ICD-10-CM | POA: Diagnosis not present

## 2011-12-12 DIAGNOSIS — Z8673 Personal history of transient ischemic attack (TIA), and cerebral infarction without residual deficits: Secondary | ICD-10-CM | POA: Diagnosis not present

## 2011-12-12 DIAGNOSIS — R5381 Other malaise: Secondary | ICD-10-CM | POA: Diagnosis not present

## 2011-12-12 DIAGNOSIS — Z951 Presence of aortocoronary bypass graft: Secondary | ICD-10-CM | POA: Diagnosis not present

## 2011-12-12 DIAGNOSIS — I2510 Atherosclerotic heart disease of native coronary artery without angina pectoris: Secondary | ICD-10-CM | POA: Diagnosis not present

## 2011-12-12 DIAGNOSIS — Z79899 Other long term (current) drug therapy: Secondary | ICD-10-CM | POA: Diagnosis not present

## 2011-12-12 DIAGNOSIS — J449 Chronic obstructive pulmonary disease, unspecified: Secondary | ICD-10-CM | POA: Diagnosis not present

## 2011-12-12 DIAGNOSIS — Z7902 Long term (current) use of antithrombotics/antiplatelets: Secondary | ICD-10-CM | POA: Diagnosis not present

## 2011-12-12 DIAGNOSIS — Z9861 Coronary angioplasty status: Secondary | ICD-10-CM | POA: Diagnosis not present

## 2011-12-12 DIAGNOSIS — I209 Angina pectoris, unspecified: Secondary | ICD-10-CM | POA: Diagnosis not present

## 2011-12-12 DIAGNOSIS — Z7982 Long term (current) use of aspirin: Secondary | ICD-10-CM | POA: Diagnosis not present

## 2011-12-12 DIAGNOSIS — I10 Essential (primary) hypertension: Secondary | ICD-10-CM | POA: Diagnosis not present

## 2011-12-12 DIAGNOSIS — E119 Type 2 diabetes mellitus without complications: Secondary | ICD-10-CM | POA: Diagnosis not present

## 2011-12-12 DIAGNOSIS — R0609 Other forms of dyspnea: Secondary | ICD-10-CM | POA: Diagnosis not present

## 2011-12-12 DIAGNOSIS — Z794 Long term (current) use of insulin: Secondary | ICD-10-CM | POA: Diagnosis not present

## 2011-12-12 DIAGNOSIS — I509 Heart failure, unspecified: Secondary | ICD-10-CM | POA: Diagnosis not present

## 2011-12-12 DIAGNOSIS — R0602 Shortness of breath: Secondary | ICD-10-CM | POA: Diagnosis not present

## 2011-12-12 DIAGNOSIS — E785 Hyperlipidemia, unspecified: Secondary | ICD-10-CM | POA: Diagnosis not present

## 2011-12-12 DIAGNOSIS — R079 Chest pain, unspecified: Secondary | ICD-10-CM | POA: Diagnosis not present

## 2011-12-12 DIAGNOSIS — R0789 Other chest pain: Secondary | ICD-10-CM | POA: Diagnosis not present

## 2011-12-12 LAB — BASIC METABOLIC PANEL
BUN: 16 mg/dL (ref 7–18)
Chloride: 102 mmol/L (ref 98–107)
Creatinine: 1.13 mg/dL (ref 0.60–1.30)
EGFR (African American): 53 — ABNORMAL LOW
EGFR (Non-African Amer.): 46 — ABNORMAL LOW
Glucose: 118 mg/dL — ABNORMAL HIGH (ref 65–99)
Osmolality: 276 (ref 275–301)

## 2011-12-12 LAB — PRO B NATRIURETIC PEPTIDE: B-Type Natriuretic Peptide: 136 pg/mL (ref 0–450)

## 2011-12-12 LAB — CBC
HCT: 37.8 % (ref 35.0–47.0)
HGB: 12.4 g/dL (ref 12.0–16.0)
MCH: 29.5 pg (ref 26.0–34.0)
MCHC: 32.7 g/dL (ref 32.0–36.0)
MCV: 90 fL (ref 80–100)
Platelet: 200 10*3/uL (ref 150–440)
RBC: 4.2 10*6/uL (ref 3.80–5.20)

## 2011-12-12 LAB — CK TOTAL AND CKMB (NOT AT ARMC): CK, Total: 42 U/L (ref 21–215)

## 2011-12-13 ENCOUNTER — Ambulatory Visit: Payer: Medicare Other | Admitting: Cardiovascular Disease

## 2011-12-13 DIAGNOSIS — R0989 Other specified symptoms and signs involving the circulatory and respiratory systems: Secondary | ICD-10-CM | POA: Diagnosis not present

## 2011-12-13 DIAGNOSIS — E119 Type 2 diabetes mellitus without complications: Secondary | ICD-10-CM | POA: Diagnosis not present

## 2011-12-13 DIAGNOSIS — I10 Essential (primary) hypertension: Secondary | ICD-10-CM | POA: Diagnosis not present

## 2011-12-13 DIAGNOSIS — I200 Unstable angina: Secondary | ICD-10-CM | POA: Diagnosis not present

## 2011-12-13 DIAGNOSIS — R0609 Other forms of dyspnea: Secondary | ICD-10-CM | POA: Diagnosis not present

## 2011-12-13 LAB — LIPID PANEL
Cholesterol: 146 mg/dL (ref 0–200)
HDL Cholesterol: 65 mg/dL — ABNORMAL HIGH (ref 40–60)
Ldl Cholesterol, Calc: 58 mg/dL (ref 0–100)
Triglycerides: 113 mg/dL (ref 0–200)

## 2011-12-13 LAB — CBC WITH DIFFERENTIAL/PLATELET
Basophil #: 0 10*3/uL (ref 0.0–0.1)
Basophil %: 0.5 %
Eosinophil #: 0.2 10*3/uL (ref 0.0–0.7)
HCT: 39.5 % (ref 35.0–47.0)
HGB: 13 g/dL (ref 12.0–16.0)
Lymphocyte %: 36.6 %
MCHC: 32.9 g/dL (ref 32.0–36.0)
Monocyte %: 11.6 %
Neutrophil #: 4.3 10*3/uL (ref 1.4–6.5)
Neutrophil %: 49.1 %
RBC: 4.38 10*6/uL (ref 3.80–5.20)
RDW: 14.4 % (ref 11.5–14.5)

## 2011-12-13 LAB — BASIC METABOLIC PANEL
BUN: 17 mg/dL (ref 7–18)
Calcium, Total: 9.5 mg/dL (ref 8.5–10.1)
EGFR (African American): 50 — ABNORMAL LOW
EGFR (Non-African Amer.): 43 — ABNORMAL LOW
Glucose: 100 mg/dL — ABNORMAL HIGH (ref 65–99)
Osmolality: 281 (ref 275–301)
Potassium: 3.8 mmol/L (ref 3.5–5.1)
Sodium: 140 mmol/L (ref 136–145)

## 2011-12-14 ENCOUNTER — Ambulatory Visit: Payer: Medicare Other | Admitting: Family Medicine

## 2011-12-15 ENCOUNTER — Telehealth: Payer: Self-pay

## 2011-12-15 ENCOUNTER — Other Ambulatory Visit: Payer: Self-pay

## 2011-12-15 MED ORDER — METOPROLOL TARTRATE 25 MG PO TABS: 25.0000 mg | ORAL_TABLET | Freq: Two times a day (BID) | ORAL | Status: DC

## 2011-12-15 MED ORDER — ISOSORBIDE MONONITRATE ER 60 MG PO TB24: 60.0000 mg | ORAL_TABLET | Freq: Two times a day (BID) | ORAL | Status: DC

## 2011-12-15 NOTE — Telephone Encounter (Signed)
Message copied by Marcelle Overlie on Wed Dec 15, 2011  1:49 PM ------      Message from: Festus Aloe      Created: Wed Dec 15, 2011  1:38 PM       ? TCM patient      Has a follow up appointment with Dr. Mariah Milling on December 17, 2011.

## 2011-12-15 NOTE — Telephone Encounter (Signed)
TCM call Pt was d/c from [facility] 11/11 after being admitted for CHF exacerbation She says she is feeling well, has been weighing her self daily and received a "booklet" re: CHF Confirms she has all meds she needs Says lasix was increased at D/C to 40 mg daily at d/c She confirms compliance Confirms appt with Dr. Mariah Milling 12/17/11 Denies worsening sob or edema

## 2011-12-16 ENCOUNTER — Encounter: Payer: Self-pay | Admitting: Family Medicine

## 2011-12-17 ENCOUNTER — Ambulatory Visit (INDEPENDENT_AMBULATORY_CARE_PROVIDER_SITE_OTHER): Payer: Medicare Other | Admitting: Cardiovascular Disease

## 2011-12-17 ENCOUNTER — Encounter: Payer: Self-pay | Admitting: Cardiovascular Disease

## 2011-12-17 VITALS — BP 112/74 | HR 70 | Ht 65.0 in | Wt 148.8 lb

## 2011-12-17 DIAGNOSIS — I5032 Chronic diastolic (congestive) heart failure: Secondary | ICD-10-CM

## 2011-12-17 DIAGNOSIS — I509 Heart failure, unspecified: Secondary | ICD-10-CM

## 2011-12-17 DIAGNOSIS — I2510 Atherosclerotic heart disease of native coronary artery without angina pectoris: Secondary | ICD-10-CM

## 2011-12-17 DIAGNOSIS — E785 Hyperlipidemia, unspecified: Secondary | ICD-10-CM

## 2011-12-17 DIAGNOSIS — R0602 Shortness of breath: Secondary | ICD-10-CM

## 2011-12-17 MED ORDER — FUROSEMIDE 40 MG PO TABS: 40.0000 mg | ORAL_TABLET | Freq: Every day | ORAL | Status: DC

## 2011-12-17 NOTE — Assessment & Plan Note (Signed)
She reports her edema and shortness rate has improved on the higher dose Lasix. Lasix dose has increased from 20 mg 3 days a week to 40 mg daily at the time of discharge. I'm concerned about dehydration in the future on a high dose daily as her echocardiogram did not suggest significantly elevated right ventricular systolic pressures. Granted in the setting of underlying lung disease, she may feel better when she is more on the "dry"side. We'll check basic metabolic panel early next week. Your creatinine has started to climb, we will decrease Lasix to 20 mg daily. For symptom relief, would probably try to run her creatinine slightly high.

## 2011-12-17 NOTE — Assessment & Plan Note (Signed)
Shortness of breath is less likely from cardiac issues given normal stress test, normal echocardiogram. Requiring 2-3 L of oxygen on a daily basis. For symptom relief, we will try moderately aggressive diuresis and run her prerenal. Echocardiogram with normal right ventricular systolic pressures suggesting no significant heart failure on recent hospital admission.

## 2011-12-17 NOTE — Progress Notes (Signed)
Patient ID: Christine Blanchard, female    DOB: 1928/04/30, 78 y.o.   MRN: [id number]  HPI Comments: Christine Blanchard is a very pleasant 78 year old woman with history of coronary artery disease, bypass surgery in 2004, hyperlipidemia, PCI 6 months after her bypass, repeat PCI one year later (She does report having a stroke after her cardiac catheterization), diabetes, hypertension, spinal stenosis who currently lives in a nursing home in [place], who has chronic unsteady gait  with worsening of her shortness of breath over the past year and  on chronic oxygen who presents for routine followup. Pulmonary workup has revealed restrictive lung disease.  Previous cardiac CTA was ordered for atypical chest pain that showed: Patent SVG to D1 with poor distal runoff,  Occluded LIMA to LAD.  Distal LAD never visualized,  RCA and circumflex patient with multiple 50% or less calcfic Lesions,  Poor distal runoff from stented IM/LAD and small D1 supplied by SVG  She was seen by Dr. Kendrick Fries pulmonary in October 2013 Recent hospital admission for shortness of breath last week. She had diuresis with Lasix. Echocardiogram and stress test to rule out cardiac cause of shortness of breath. Since her discharge, she has not had any further chest pain. Her breathing has been stable. She requires chronic oxygenation 2-3 L,   Echocardiogram was essentially normal with normal ejection fraction greater than 55%, diastolic dysfunction, normal right ventricular systolic pressures  Stress test showed no ischemia with ejection fraction greater than 55% In the hospital, total cholesterol 146, LDL 58, HDL 65  EKG shows Normal sinus rhythm with rate 70 beats per minute with poor R-wave progression through the anterior precordial leads, nonspecific T wave abnormality. No significant change from her prior EKG   Outpatient Encounter Prescriptions as of 12/17/2011  Medication Sig Dispense Refill  . albuterol (PROVENTIL,VENTOLIN) 90 MCG/ACT  inhaler Inhale 2 puffs into the lungs every 6 (six) hours as needed for wheezing.  3 each  3  . aspirin 325 MG tablet Take 325 mg by mouth daily.        Marland Kitchen atorvastatin (LIPITOR) 20 MG tablet Take 1 tablet (20 mg total) by mouth Nightly.  90 tablet  3  . Casanthranol-Docusate Sodium 30-100 MG CAPS as needed.        . citalopram (CELEXA) 20 MG tablet Take 1 tablet (20 mg total) by mouth daily.  90 tablet  2  . clopidogrel (PLAVIX) 75 MG tablet Take 1 tablet (75 mg total) by mouth daily.  30 tablet  1  . ezetimibe (ZETIA) 10 MG tablet Take 1 tablet (10 mg total) by mouth daily.  90 tablet  3  . fenofibrate micronized (ANTARA) 130 MG capsule Take 130 mg by mouth daily.        . fluticasone (FLONASE) 50 MCG/ACT nasal spray Place 2 sprays into the nose daily.  48 g  2  . furosemide (LASIX) 40 MG tablet Take 40 mg by mouth daily.       . insulin glargine (LANTUS) 100 UNIT/ML injection Inject 20 Units into the skin every morning.  10 mL  11  . Insulin Pen Needle (PEN NEEDLES 31GX5/16") 31G X 8 MM MISC Use to check blood sugar daily.  Dx:250.00      . isosorbide mononitrate (IMDUR) 60 MG 24 hr tablet Take 1 tablet (60 mg total) by mouth 2 (two) times daily. 1 tablet two times a day  180 tablet  3  . loratadine (CLARITIN) 10 MG tablet Take 1  tablet (10 mg total) by mouth daily.  90 tablet  1  . LORazepam (ATIVAN) 2 MG tablet Take 0.5 tablets (1 mg total) by mouth at bedtime as needed. insomnia  30 tablet  3  . metFORMIN (GLUCOPHAGE-XR) 500 MG 24 hr tablet Take 2 tablets (1,000 mg total) by mouth at bedtime.  180 tablet  3  . metoprolol tartrate (LOPRESSOR) 25 MG tablet Take 1 tablet (25 mg total) by mouth 2 (two) times daily.  180 tablet  3  . mirtazapine (REMERON) 15 MG tablet Take 7.5 mg by mouth at bedtime.      . Multiple Vitamin (MULTIVITAMIN) capsule Take 1 capsule by mouth daily.        . nitroGLYCERIN (NITROSTAT) 0.4 MG SL tablet Place 1 tablet (0.4 mg total) under the tongue as needed.  25 tablet   1  . potassium chloride SA (K-DUR,KLOR-CON) 20 MEQ tablet Take 1/2 tablet daily only when you take lasix      . prednisoLONE sodium phosphate (INFLAMASE FORTE) 1 % ophthalmic solution Place 1 drop into both eyes daily.       . RABEprazole (ACIPHEX) 20 MG tablet Take 1 tablet (20 mg total) by mouth daily.  90 tablet  3  . ranolazine (RANEXA) 1000 MG SR tablet Take 1 tablet (1,000 mg total) by mouth 2 (two) times daily.  180 tablet  3  . sitaGLIPtan (JANUVIA) 100 MG tablet Take 100 mg by mouth daily.        . Vitamin D, Ergocalciferol, (DRISDOL) 50000 UNITS CAPS TAKE 1 CAPSULE BY MOUTH ONCE A WEEK  13 capsule  3    Review of Systems  Constitutional: Negative for fever and unexpected weight change.  HENT: Negative.   Eyes: Negative.   Respiratory: Positive for shortness of breath. Negative for apnea, cough, choking, chest tightness, wheezing and stridor.   Cardiovascular: Negative.   Gastrointestinal: Negative.   Musculoskeletal: Positive for gait problem.       Profound leg weakness  Skin: Negative.   Neurological: Positive for weakness.  Hematological: Negative.   Psychiatric/Behavioral: Negative.   All other systems reviewed and are negative.    BP 112/74  Pulse 70  Ht 5\' 5"  (1.651 m)  Wt 148 lb 12 oz (67.473 kg)  BMI 24.75 kg/m2  Physical Exam  Nursing note and vitals reviewed. Constitutional: She is oriented to person, place, and time. She appears well-developed and well-nourished.       On chronic oxygen nasal cannula  HENT:  Head: Normocephalic.  Nose: Nose normal.  Mouth/Throat: Oropharynx is clear and moist.  Eyes: Conjunctivae normal are normal. Pupils are equal, round, and reactive to light.  Neck: Normal range of motion. Neck supple. No JVD present.  Cardiovascular: Normal rate, regular rhythm, S1 normal, S2 normal and intact distal pulses.  Exam reveals no gallop and no friction rub.   Murmur heard.  Crescendo systolic murmur is present with a grade of 2/6    Pulmonary/Chest: Effort normal. No respiratory distress. She has decreased breath sounds. She has no wheezes. She has no rales. She exhibits no tenderness.  Abdominal: Soft. Bowel sounds are normal. She exhibits no distension. There is no tenderness.  Musculoskeletal: Normal range of motion. She exhibits no tenderness.  Lymphadenopathy:    She has no cervical adenopathy.  Neurological: She is alert and oriented to person, place, and time. Coordination normal.  Skin: Skin is warm and dry. No rash noted. No erythema.  Psychiatric: She has a normal  mood and affect. Her behavior is normal. Judgment and thought content normal.         Assessment and Plan

## 2011-12-17 NOTE — Patient Instructions (Addendum)
You are doing well. No medication changes were made.  We will check your kidney function on Monday at [facility]  Please call us if you have new issues that need to be addressed before your next appt.  Your physician wants you to follow-up in: 3 months.  You will receive a reminder letter in the mail two months in advance. If you don't receive a letter, please call our office to schedule the follow-up appointment.

## 2011-12-17 NOTE — Assessment & Plan Note (Signed)
Recent negative stress test. Normal echocardiogram. If she has worsening shortness of breath or more chest pain, would need to discuss possible cardiac catheterization. She is not eager to do this as she did have complications on her previous catheterization (cva). Aggressive medical management at this time.

## 2011-12-17 NOTE — Assessment & Plan Note (Signed)
Cholesterol is at goal on the current lipid regimen. No changes to the medications were made.  

## 2011-12-20 ENCOUNTER — Ambulatory Visit (INDEPENDENT_AMBULATORY_CARE_PROVIDER_SITE_OTHER): Payer: Medicare Other | Admitting: Family Medicine

## 2011-12-20 ENCOUNTER — Encounter: Payer: Self-pay | Admitting: Family Medicine

## 2011-12-20 VITALS — BP 140/80 | HR 76 | Temp 98.2°F | Wt 149.2 lb

## 2011-12-20 DIAGNOSIS — I509 Heart failure, unspecified: Secondary | ICD-10-CM

## 2011-12-20 DIAGNOSIS — I5032 Chronic diastolic (congestive) heart failure: Secondary | ICD-10-CM | POA: Diagnosis not present

## 2011-12-20 DIAGNOSIS — E119 Type 2 diabetes mellitus without complications: Secondary | ICD-10-CM

## 2011-12-20 NOTE — Progress Notes (Signed)
Subjective:    Patient ID: Christine Blanchard, female    DOB: 13-Nov-1928, 78 y.o.   MRN: [id number]  HPI CC: hospital f/u  Presents with daughter, [name].  Mrs Kubiak is a pleasant 78 yo with h/o HTN, DM, h/o CVA after cath, severe CAD s/p multiple interventions now planned medical management, diastolic CHF who was recently admitted to [facility] 2/2 severe fatigue, significant angina leading to 30+ SL nitro use, and SOB.  Was thought to be in acute CHF exacerbation.  8lbs diuresed in hospital.  Advised to keep low sodium and fluid restriction to 6 cups of fluid daily.  Discharged on 40mg  lasix daily (prior on 20mg  MWF) and increased potassium dose.  Stress test during hospital WNL, echo with normal EF and diastolic dysfunction.  Brings log of weights - 142-145lbs with pajamas.  Since discharge, only had 1 anginal episode.  Improved SOB, no leg swelling.    DM - no low sugars.  No hypoglycemic sxs.  Reviewed A1c.  Well controlled.  tolerant of current regimen.    Past Medical History  Diagnosis Date  . Hypertension   . Diabetes mellitus 2003    previously saw Dr. Leslie Dales  . Allergy   . Hyperlipidemia   . History of chicken pox     has had shingles shot  . History of diverticulitis of colon 1991, 1992, 1994  . History of kidney stones 1952  . CVA (cerebral infarction)     minimal R residual weakness, affects speech when tired  . Urinary incontinence     stress/urge, per D. Tannenbaum/Lomax  . CAD (coronary artery disease)     severe, s/p 4 cardiac caths, minimally invasive CABG @[facility] , ICA stent 2004 then LAD stent 2005, only medical management recommended now  . Breathing difficulty 2011    on O2 since 10/2009  . Urinary tract infection     last 2011  . Syncope 2005    s/p w/u including CT scan and 24 hour Holter  . GERD (gastroesophageal reflux disease) 2004  . Macular degeneration     s/p corneal implants  . Benign essential tremor     toprol XL and remeron  . Herpes zoster  ophthalmicus 2009    history  . Hearing loss of both ears 08/2010    audiology eval 08/2010 - mod sensorineural heaing loss, rec trial digital hearing aids and re eval yearly to monitor  . Vitamin D deficiency     on 50k units weekly  . Diastolic CHF 03/2010    grade 1, normal EF, mildly dilated LA, normal PA pressures  . Fracture of rib      Review of Systems Per HPI    Objective:   Physical Exam  Nursing note and vitals reviewed. Constitutional: She appears well-developed and well-nourished. No distress.       [address] in place  HENT:  Head: Normocephalic and atraumatic.  Mouth/Throat: Oropharynx is clear and moist. No oropharyngeal exudate.  Eyes: Conjunctivae normal and EOM are normal. Pupils are equal, round, and reactive to light. No scleral icterus.  Neck: Normal range of motion.  Cardiovascular: Normal rate, regular rhythm, normal heart sounds and intact distal pulses.   No murmur heard. Pulmonary/Chest: Effort normal. No respiratory distress. She has no wheezes. She has no rales.       Bibasilar crackles L>R  Musculoskeletal: She exhibits edema (trace nonpitting edema).  Skin: Skin is dry. No rash noted.  Psychiatric: She has a normal mood and affect.  Assessment & Plan:

## 2011-12-20 NOTE — Assessment & Plan Note (Signed)
Recent hospitalization for acute diastolic CHF flare. Lasix increased to 40mg  daily.  Doing well since discharge. Checking BMP today to monitor potassium and creatinine. Discussed symptoms to monitor for CHF flare.

## 2011-12-20 NOTE — Assessment & Plan Note (Signed)
Chronic, improved with A1c 6.6%.  Continue meds. Anticipate goal <8% given comorbidities, however as tolerating current meds without hypoglycemia, continue.

## 2011-12-20 NOTE — Patient Instructions (Signed)
Blood work today. Goal sodium intake around 1.8gm/day. Good to see you today, I'm glad you're feeling better. Great job with sugars!

## 2011-12-21 LAB — BASIC METABOLIC PANEL
CO2: 36 mEq/L — ABNORMAL HIGH (ref 19–32)
Chloride: 94 mEq/L — ABNORMAL LOW (ref 96–112)
Creatinine, Ser: 1.2 mg/dL (ref 0.4–1.2)
Glucose, Bld: 103 mg/dL — ABNORMAL HIGH (ref 70–99)

## 2011-12-21 NOTE — Addendum Note (Signed)
Addended by: Alvina Chou on: 12/21/2011 08:14 AM   Modules accepted: Orders

## 2011-12-22 ENCOUNTER — Other Ambulatory Visit: Payer: Self-pay | Admitting: *Deleted

## 2011-12-22 ENCOUNTER — Telehealth: Payer: Self-pay | Admitting: Family Medicine

## 2011-12-22 DIAGNOSIS — I10 Essential (primary) hypertension: Secondary | ICD-10-CM

## 2011-12-22 MED ORDER — SITAGLIPTIN PHOSPHATE 100 MG PO TABS: 100.0000 mg | ORAL_TABLET | Freq: Every day | ORAL | Status: DC

## 2011-12-22 NOTE — Telephone Encounter (Signed)
Pt wants to know when pt should have next lab test done.Please advise.

## 2011-12-22 NOTE — Telephone Encounter (Signed)
Patient notified

## 2011-12-22 NOTE — Telephone Encounter (Signed)
plz notify I reviewed BMP - recommend decrease lasix to 20mg  daily, may continue potassium daily/. Kidneys slightly deteriorated (cr 1.2), potassium level was normal.

## 2011-12-23 NOTE — Telephone Encounter (Signed)
Recheck next Friday.  Placed order in chart.

## 2011-12-23 NOTE — Addendum Note (Signed)
Addended by: Eustaquio Boyden on: 12/23/2011 01:58 PM   Modules accepted: Orders

## 2011-12-23 NOTE — Telephone Encounter (Signed)
Patient notified and lab appt scheduled.  

## 2011-12-31 ENCOUNTER — Other Ambulatory Visit (INDEPENDENT_AMBULATORY_CARE_PROVIDER_SITE_OTHER): Payer: Medicare Other

## 2011-12-31 DIAGNOSIS — I10 Essential (primary) hypertension: Secondary | ICD-10-CM | POA: Diagnosis not present

## 2011-12-31 LAB — BASIC METABOLIC PANEL
BUN: 19 mg/dL (ref 6–23)
GFR: 53.38 mL/min — ABNORMAL LOW (ref >60.00)
Glucose, Bld: 133 mg/dL — ABNORMAL HIGH (ref 70–99)
Potassium: 4.3 mEq/L (ref 3.5–5.1)

## 2012-01-03 ENCOUNTER — Telehealth: Payer: Self-pay

## 2012-01-03 MED ORDER — ACCU-CHEK AVIVA PLUS W/DEVICE KIT: 1.0000 | PACK | Freq: Three times a day (TID) | Status: DC

## 2012-01-03 NOTE — Telephone Encounter (Signed)
Pt's glucose meter is not working. Pt has strips for Accu-chek Aviva.pt request Accu-chek Aviva glucose meter to [facility].Please advise.

## 2012-01-03 NOTE — Telephone Encounter (Signed)
Message copied by Loma Messing on Mon Jan 03, 2012 12:44 PM ------      Message from: Eustaquio Boyden      Created: Sun Jan 02, 2012  9:18 AM       plz notify blood work returned stable - kidney function improved.  How is SOB on new lasix dose?

## 2012-01-03 NOTE — Telephone Encounter (Signed)
Informed pt of lab results.  Asked pt how she was doing on new dose of lasix.  Pt replied that she is doing "about the same" but that she has made an appt to see Dr Sharen Hones tomorrow to follow up with this.

## 2012-01-03 NOTE — Telephone Encounter (Signed)
Left message that accucheck meter was called in.

## 2012-01-03 NOTE — Telephone Encounter (Signed)
plz notify I've sent in aviva accucheck glucose meter. Will see her tomorrow.

## 2012-01-04 ENCOUNTER — Encounter: Payer: Self-pay | Admitting: Family Medicine

## 2012-01-04 ENCOUNTER — Ambulatory Visit (INDEPENDENT_AMBULATORY_CARE_PROVIDER_SITE_OTHER): Payer: Medicare Other | Admitting: Family Medicine

## 2012-01-04 VITALS — BP 118/60 | HR 72 | Temp 98.3°F | Wt 148.0 lb

## 2012-01-04 DIAGNOSIS — I5032 Chronic diastolic (congestive) heart failure: Secondary | ICD-10-CM

## 2012-01-04 DIAGNOSIS — I509 Heart failure, unspecified: Secondary | ICD-10-CM | POA: Diagnosis not present

## 2012-01-04 DIAGNOSIS — W19XXXA Unspecified fall, initial encounter: Secondary | ICD-10-CM

## 2012-01-04 DIAGNOSIS — R269 Unspecified abnormalities of gait and mobility: Secondary | ICD-10-CM

## 2012-01-04 DIAGNOSIS — R2681 Unsteadiness on feet: Secondary | ICD-10-CM

## 2012-01-04 NOTE — Assessment & Plan Note (Addendum)
Anticipate continued multifactorial imbalance.  Provided with script for v-shaped 3 legged walker which is lighter and more easy to maneuver for her. I have also requested records from Dr. Temple Pacini workup as well to review. Latest vitamin D - 38 (2012). neurological exam nonfocal today although imbalance noted with romberg. If continued falls despite walker use, will further evaluate.  Trouble using cane 2/2 oxygen tank. Rolling walkers are too heavy for her to effectively handle/maneuver. Interested in V shaped walker (3 wheeled rollator)- states she has used a friend's and did very well with this as much more lightweight. Christine Blanchard requires the use of a walker in her home in order to get to the bathroom to groom and the kitchen to eat her meal in a reasonable amount of time and to prevent falls which could be life threatening. She is expected to need the walker for lifetime

## 2012-01-04 NOTE — Assessment & Plan Note (Addendum)
Last labwork I had asked her to decrease lasix to 20mg  daily 2/2 slight bump in creatinine, however now noting worsening pedal edema - will increase lasix to 20mg /40mg  alternating daily, rtc 3 wks for bmp check.

## 2012-01-04 NOTE — Patient Instructions (Addendum)
Sign release of records from [facility] (ph [phone], fax [phone]) to get old records from Dr. Channing Mutters. Let's increase lasix to 20mg  alternating with 40mg  every other day. Return in 3 weeks for blood work again to check kidneys. Prescription for v-shaped walker provided today. Good to see you , call us with questions. Come in 1 week prior to next appointment for blood work.

## 2012-01-04 NOTE — Progress Notes (Addendum)
Subjective:    Patient ID: Christine Blanchard, female    DOB: 1928-11-03, 78 y.o.   MRN: [id number]  HPI CC: trouble walking  Has had 2 falls last 2 sundays in a row when going to church.  Denies faint/lightheaded, weakness, vertigo, palpitations, LOC.  Denies HA after falls.  Easily loses balance.  Has hit head and arms.  On aspirin 325mg  daily.  Has 4 legged walker she has used in past, notes feels much more steady when walking with this.    Has changed to lighter O2 tank which helps.  Does well with rail.  Currently does not regularly use any ambulatory assistive device.  See prior notes for CHF details - last blood work I decreaesd lasix to 20mg  daily (from 40mg  daily) because Cr slightly bumped.  Pt notes some pedal edema developing on 20mg  lasix daily.  H/o lumbar spinal stenosis and severe spondylosis.  Denies significant lower back pain currently.  Prior saw Dr. Channing Mutters who told her she had some brainstem ischemia leading to dizziness.   Sent to PT 12/2010 for imbalance thought multifactorial.  Worked with Trey Paula therapist.  Completed 3 months of PT.  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   Past Medical History  Diagnosis Date  . Hypertension   . Diabetes mellitus 2003    previously saw Dr. Leslie Dales  . Allergy   . Hyperlipidemia   . History of chicken pox     has had shingles shot  . History of diverticulitis of colon 1991, 1992, 1994  . History of kidney stones 1952  . CVA (cerebral infarction)     minimal R residual weakness, affects speech when tired  . Urinary incontinence     stress/urge, per D. Tannenbaum/Lomax  . CAD (coronary artery disease)     severe, s/p 4 cardiac caths, minimally invasive CABG @[facility] , ICA stent 2004 then LAD stent 2005, only medical management recommended now  . Breathing difficulty 2011    on O2 since 10/2009  . Syncope 2005    s/p w/u including CT scan and 24 hour Holter  . GERD (gastroesophageal reflux disease) 2004  . Macular degeneration     s/p corneal implants  . Benign essential tremor     toprol XL and remeron  . Herpes zoster ophthalmicus 2009    history  . Hearing loss of both ears 08/2010    audiology eval 08/2010 - mod sensorineural heaing loss, rec trial digital hearing aids and re eval yearly to monitor  . Vitamin D deficiency     on 50k units weekly  . Diastolic CHF 03/2010    grade 1, normal EF, mildly dilated LA, normal PA pressures  . Lumbar spinal stenosis     with severe spondylosis    Review of Systems Per HPI    Objective:   Physical Exam  Nursing note and vitals reviewed. Constitutional: She is oriented to person, place, and time. She appears well-developed and well-nourished. No distress.  HENT:  Head: Normocephalic and atraumatic.  Mouth/Throat: Oropharynx is clear and moist. No oropharyngeal exudate.  Eyes: Conjunctivae normal and EOM are normal. Pupils are equal, round, and reactive to light. No scleral icterus.  Neck: Normal range of motion. Neck supple.  Cardiovascular: Normal rate, regular rhythm, normal heart sounds and intact distal pulses.   No murmur heard. Pulmonary/Chest: Effort normal and breath sounds normal. No respiratory distress. She has no wheezes. She has no rales.  Musculoskeletal: She exhibits edema (1+ pitting edema).  Neurological:  She is alert and oriented to person, place, and time. Coordination abnormal.       Normal FTN. Positive romberg - unsteady. No pronator drift.  Skin: Skin is warm and dry. No rash noted.  Psychiatric: She has a normal mood and affect.       Assessment & Plan:

## 2012-01-12 DIAGNOSIS — I5032 Chronic diastolic (congestive) heart failure: Secondary | ICD-10-CM

## 2012-01-12 DIAGNOSIS — I2510 Atherosclerotic heart disease of native coronary artery without angina pectoris: Secondary | ICD-10-CM | POA: Diagnosis not present

## 2012-01-12 DIAGNOSIS — R0602 Shortness of breath: Secondary | ICD-10-CM

## 2012-01-12 DIAGNOSIS — I509 Heart failure, unspecified: Secondary | ICD-10-CM | POA: Diagnosis not present

## 2012-01-12 DIAGNOSIS — E785 Hyperlipidemia, unspecified: Secondary | ICD-10-CM | POA: Diagnosis not present

## 2012-01-13 ENCOUNTER — Other Ambulatory Visit: Payer: Self-pay | Admitting: Family Medicine

## 2012-01-14 ENCOUNTER — Ambulatory Visit: Payer: Medicare Other | Admitting: Cardiovascular Disease

## 2012-01-17 DIAGNOSIS — L821 Other seborrheic keratosis: Secondary | ICD-10-CM | POA: Diagnosis not present

## 2012-01-17 DIAGNOSIS — L578 Other skin changes due to chronic exposure to nonionizing radiation: Secondary | ICD-10-CM | POA: Diagnosis not present

## 2012-01-17 DIAGNOSIS — L570 Actinic keratosis: Secondary | ICD-10-CM | POA: Diagnosis not present

## 2012-01-17 DIAGNOSIS — L820 Inflamed seborrheic keratosis: Secondary | ICD-10-CM | POA: Diagnosis not present

## 2012-01-17 DIAGNOSIS — Z85828 Personal history of other malignant neoplasm of skin: Secondary | ICD-10-CM | POA: Diagnosis not present

## 2012-01-18 ENCOUNTER — Telehealth: Payer: Self-pay | Admitting: Pulmonary Disease

## 2012-01-18 NOTE — Telephone Encounter (Signed)
According to Dr. Nicanor Alcon note from 12/3 the walker was ordered by his office for gait issues I have called and spoke with Berton Mount and notified to call Dr. Nicanor Alcon office for rx for walker She verbalized understanding and states nothing further needed

## 2012-01-19 ENCOUNTER — Telehealth: Payer: Self-pay

## 2012-01-19 NOTE — Telephone Encounter (Signed)
Christine Blanchard with [facility] left v/m requesting rx and written documentation for need  for rolator walker. Christine Blanchard request call back

## 2012-01-20 ENCOUNTER — Telehealth: Payer: Self-pay | Admitting: *Deleted

## 2012-01-20 NOTE — Telephone Encounter (Signed)
Printed and placed in Kim's box. 

## 2012-01-20 NOTE — Telephone Encounter (Signed)
Harriet from [facility] called back and said that per Medicare guidelines office note needs an addendum or there needs to be a letter stating that "patient requires the use of a walker in her home in order to get to the bathroom to groom and the kitchen to eat her meal in a reasonable amount of time and to prevent falls which could be life threatening. She is expected to need the walker for lifetime."  Otherwise medicare wont pay for it.

## 2012-01-20 NOTE — Telephone Encounter (Signed)
Faxed to [facility]

## 2012-01-20 NOTE — Telephone Encounter (Signed)
Done and printed and placed in Kim's box.

## 2012-01-20 NOTE — Telephone Encounter (Signed)
Paperwork faxed and Harriet notified.

## 2012-01-24 ENCOUNTER — Other Ambulatory Visit: Payer: Medicare Other

## 2012-01-24 ENCOUNTER — Ambulatory Visit (INDEPENDENT_AMBULATORY_CARE_PROVIDER_SITE_OTHER): Payer: Medicare Other

## 2012-01-24 DIAGNOSIS — Z79899 Other long term (current) drug therapy: Secondary | ICD-10-CM

## 2012-01-24 DIAGNOSIS — I2510 Atherosclerotic heart disease of native coronary artery without angina pectoris: Secondary | ICD-10-CM

## 2012-01-25 LAB — BASIC METABOLIC PANEL
GFR calc Af Amer: 54 mL/min/{1.73_m2} — ABNORMAL LOW (ref >59)
GFR calc non Af Amer: 47 mL/min/{1.73_m2} — ABNORMAL LOW (ref >59)
Glucose: 115 mg/dL — ABNORMAL HIGH (ref 65–99)
Potassium: 4.4 mmol/L (ref 3.5–5.2)

## 2012-01-27 ENCOUNTER — Other Ambulatory Visit: Payer: Self-pay

## 2012-01-27 MED ORDER — ACCU-CHEK SOFTCLIX LANCETS MISC: Status: DC

## 2012-01-27 NOTE — Telephone Encounter (Signed)
Pt request accu chek softclix lancets(pt has new meter and previous lancets do not fit new device.;pt also request # [phone] instead of 300)the patient advised refilled.

## 2012-01-28 ENCOUNTER — Telehealth: Payer: Self-pay

## 2012-01-28 NOTE — Telephone Encounter (Signed)
Patient notified and will bring in urine sample next week.

## 2012-01-28 NOTE — Telephone Encounter (Signed)
Advise her to keep up a good water intake -- I do not know if Dr Reece Agar will examine urine without seeing her  I will route to him, he is out of office today - unsure when he will respond-just let her know

## 2012-01-28 NOTE — Telephone Encounter (Signed)
Pt said for 2 months first morning urine is rust colored; then rest of day urine is light yellow.No odor, no burning or pain when urinates, no frequency or urgency,no abdominal or back pain and no fever. Pt said this morning 1st urine was darker rust colored than usual but now urine has cleared to light yellow. Pt does not want appt but wants to bring 1st morning urine specimen when Dr Sharen Hones returns 02/03/12.[facility].Please advise.(Pt request call back after 1 pm).

## 2012-01-28 NOTE — Telephone Encounter (Signed)
Ok to bring in morning specimen next week - to update [place] sooner if any new symptoms. If normal, doesn't need appt.  If abnormal, I would like her to come in to discuss at appointment.

## 2012-01-30 ENCOUNTER — Other Ambulatory Visit: Payer: Self-pay | Admitting: Cardiovascular Disease

## 2012-01-31 ENCOUNTER — Encounter: Payer: Self-pay | Admitting: Family Medicine

## 2012-02-01 ENCOUNTER — Telehealth (INDEPENDENT_AMBULATORY_CARE_PROVIDER_SITE_OTHER): Payer: Medicare Other | Admitting: *Deleted

## 2012-02-01 DIAGNOSIS — R82998 Other abnormal findings in urine: Secondary | ICD-10-CM | POA: Diagnosis not present

## 2012-02-01 DIAGNOSIS — R3989 Other symptoms and signs involving the genitourinary system: Secondary | ICD-10-CM

## 2012-02-01 LAB — POCT URINALYSIS DIPSTICK
Bilirubin, UA: NEGATIVE
Ketones, UA: NEGATIVE
pH, UA: 5

## 2012-02-01 NOTE — Telephone Encounter (Signed)
Patient dropped off urine sample. + for nitrites and leukocytes. Sent for culture and will schedule appt for abnormal urine results as previously discussed.

## 2012-02-01 NOTE — Telephone Encounter (Signed)
Noted.  Will await culture.  Main sxs is dark urine in am, no other urinary sxs.

## 2012-02-02 DIAGNOSIS — R29898 Other symptoms and signs involving the musculoskeletal system: Secondary | ICD-10-CM

## 2012-02-02 HISTORY — DX: Other symptoms and signs involving the musculoskeletal system: R29.898

## 2012-02-04 LAB — URINE CULTURE: Colony Count: >=100000

## 2012-02-07 ENCOUNTER — Ambulatory Visit (INDEPENDENT_AMBULATORY_CARE_PROVIDER_SITE_OTHER): Payer: Medicare Other | Admitting: Family Medicine

## 2012-02-07 ENCOUNTER — Encounter: Payer: Self-pay | Admitting: Family Medicine

## 2012-02-07 VITALS — BP 138/78 | HR 78 | Temp 97.6°F | Wt 151.8 lb

## 2012-02-07 DIAGNOSIS — R82998 Other abnormal findings in urine: Secondary | ICD-10-CM | POA: Diagnosis not present

## 2012-02-07 DIAGNOSIS — R8271 Bacteriuria: Secondary | ICD-10-CM

## 2012-02-07 NOTE — Assessment & Plan Note (Signed)
Given no urinary sxs, anticipate asxs bacteriuria. Discussed this. Will monitor for now. Pt knows to update me if any new urinary sxs to indicate infection. Encouraged drink water instead of tea.

## 2012-02-07 NOTE — Patient Instructions (Addendum)
Let's switch some of the tea to water. I think the urine culture is showing colonization, not infection.  We will monitor for now.  Let me know if urinary symptoms like burning urgency, abdominal pain or fevers/nausea.

## 2012-02-07 NOTE — Progress Notes (Signed)
  Subjective:    Patient ID: Christine Blanchard, female    DOB: 10/25/1928, 79 y.o.   MRN: [id number]  HPI CC: urinary sxs  Christine Blanchard is a pleasant 79 yo with h/o HTN, DM, h/o CVA after cath, severe CAD s/p multiple interventions now planned medical management, diastolic CHF.  Has been fluid restricted to 8 glasses of water daily.  Noted weight increase of 2 lbs in last few weeks.  Christine Blanchard presents today at my request after abnormal UA and UCx done for complaint of dark AM urine.  UA done negative for blood but was concerning for infection with trace leukocyte esterase and pos nitrites, spgr >1.030.  Started noticing darker urine first thing in am - started 2 weeks after latest hospital stay for CHF exac.    Denies dysuria, urgency or frequency (taking into account she's on lasix), flank pain, lower abd pain, fevers/chills, nausea/vomiting.  Has had longstanding mild urinary incontinence. Lab Results  Component Value Date   CREATININE 1.11* 01/24/2012   Wt Readings from Last 3 Encounters:  02/07/12 151 lb 12 oz (68.833 kg)  01/04/12 148 lb (67.132 kg)  12/20/11 149 lb 4 oz (67.699 kg)   BP Readings from Last 3 Encounters:  02/07/12 138/78  01/04/12 118/60  12/20/11 140/80    Component  Value   Color, UA  Amber   Clarity, UA  clear   Glucose, UA  Negative   Bilirubin, UA  Negative   Ketones, UA  Negative   Spec Grav, UA  >=1.030   Blood, UA  Negative   pH, UA  5.0   Protein, UA  Negative   Urobilinogen, UA  0.2   Nitrite, UA  Positive   Leukocytes, UA  Trace   Resulting Agency  [facility]    Specimen Collected: 02/01/12 12:41 PM  Last Resulted: 02/01/12 12:41 PM   Component  Value   Culture  ESCHERICHIA COLI   Colony Count  >=100,000 COLONIES/ML   Organism ID, Bacteria  ESCHERICHIA COLI   Resulting Agency  [facility]    Culture & Susceptibility     ESCHERICHIA COLI          Antibiotic  Sensitivity  Microscan  Status      AMPICILLIN  Resistant  >=32  Final    AMPICILLIN/SULBACTAM  Intermediate  16  Final      CEFAZOLIN  Sensitive  <=4  Final      CEFEPIME  Sensitive  <=1  Final      CEFOXITIN  Sensitive  <=4  Final      CEFTAZIDIME  Sensitive  <=1  Final      CEFTRIAXONE  Sensitive  <=1  Final      CIPROFLOXACIN  Sensitive  0.5  Final      GENTAMICIN  Sensitive  <=1  Final      IMIPENEM  Sensitive  <=0.25  Final      LEVOFLOXACIN  Sensitive  0.5  Final      NITROFURANTOIN  Sensitive  32  Final      PIP/TAZO  Sensitive  <=4  Final      TOBRAMYCIN  Sensitive  <=1  Final      TRIMETH/SULFA  Sensitive  <=20  Final               Review of Systems Per HPI    Objective:   Physical Exam Pleasant, NAD O2 in place.    Assessment & Plan:

## 2012-02-08 ENCOUNTER — Other Ambulatory Visit: Payer: Self-pay | Admitting: Family Medicine

## 2012-02-08 ENCOUNTER — Encounter: Payer: Self-pay | Admitting: Pulmonary Disease

## 2012-02-08 ENCOUNTER — Ambulatory Visit (INDEPENDENT_AMBULATORY_CARE_PROVIDER_SITE_OTHER): Payer: Medicare Other | Admitting: Pulmonary Disease

## 2012-02-08 VITALS — BP 100/62 | HR 65 | Temp 97.5°F | Ht 65.0 in | Wt 153.0 lb

## 2012-02-08 DIAGNOSIS — J9610 Chronic respiratory failure, unspecified whether with hypoxia or hypercapnia: Secondary | ICD-10-CM | POA: Diagnosis not present

## 2012-02-08 DIAGNOSIS — J309 Allergic rhinitis, unspecified: Secondary | ICD-10-CM

## 2012-02-08 DIAGNOSIS — J9611 Chronic respiratory failure with hypoxia: Secondary | ICD-10-CM

## 2012-02-08 DIAGNOSIS — I509 Heart failure, unspecified: Secondary | ICD-10-CM | POA: Diagnosis not present

## 2012-02-08 DIAGNOSIS — I5032 Chronic diastolic (congestive) heart failure: Secondary | ICD-10-CM

## 2012-02-08 DIAGNOSIS — J302 Other seasonal allergic rhinitis: Secondary | ICD-10-CM

## 2012-02-08 DIAGNOSIS — R0602 Shortness of breath: Secondary | ICD-10-CM

## 2012-02-08 NOTE — Assessment & Plan Note (Signed)
She continues to use and benefit from her 2L O2 continuously.  She is to continue using this.

## 2012-02-08 NOTE — Assessment & Plan Note (Signed)
Continue diuretic and BP and Na restriction regimen as noted by Dr. Renee Ramus and Dr. Mariah Milling.

## 2012-02-08 NOTE — Progress Notes (Signed)
Subjective:    Patient ID: Christine Blanchard, female    DOB: 18-Dec-1928, 79 y.o.   MRN: [id number]  Synopsis: 79 y/o female with coronary artery disease and chronic angina who has been followed by [facility] in [facility] (by Dr. Delton Coombes) for years and [facility] in [place] since 02/2011 for chronic dyspnea and hypoxemic respiratory failure.  Objectively she has both restrictive and obstructive disease and low MIP and MEP. She also likely has some degree of chronic aspiration.  She has severe coronary artery disease with chronic angina.  HPI 03/10/11 ROV -- Christine Blanchard reports to me that she is doing "great". She states that she just finished two months of physical therapy and feels better after doing so.  Her shortness of breath has been manageable lately.  She still notices that she chokes on some food, specifically "crumbly" foods like popcorn and cornbread.  Swelling has improved some.    06/14/11 ROV-- Christine Blanchard says that she likes pulmonary rehab and is able to exercise more than when she started.  She says that she really likes the staff there and says they are pushing her more. She feels physically stronger but is tired at home afterwards.  Her chronic angina is still limiting her somewhat.  She has days where she takes multiple nitroglyerin tabs.  She has not had fever, chills, or cough.  She is noting difficulty with sleeping.  Specifically she can't fall asleep after lying down until about 3AM.  She does not use caffeinated beverages other than some tea with lunch.  Her swelling has improved on her current dose of lasix.  11/03/11 ROV -- Christine Blanchard states that she had some breathing trouble a few weeks back when the weather was hot. Specifically she had itching eyes and sinus congestion. She took Zyrtec in addition to the Claritin and said that it made this better. While the sinus symptoms and itchy eyes have improved she states that she feels that she feels generally weaker than when I last saw her.  Specifically she says she has difficulty with activities of daily living around the house such as making her bed or bringing in groceries. She does not have a new cough. She does have dyspnea when performing these activities and associated chest pain. She states that her angina has been more frequent and longer lasting. She has been advised by Dr. Mariah Milling to continue taking nitrates for pain. She has not been swelling in her legs more.  02/08/2012 ROV -- Christine Blanchard was recently admitted to [facility] for a CHF exacerbation. It sounds like she stayed in the hospital for about 5 or 6 days and required IV diuretics. She had a stress test which was negative during that hospitalization but she states she has had multiple nuclear, chemical stress test in the past which have been negative despite her known severe coronary disease. She's had multiple sick family members lately including a son who has H1 N1 influenza. She was unable to see them around the holidays because of this. Since discharge from the hospital her leg swelling, and shortness of breath are much better. She is still having some frustration with her sinus medications in that in the evenings she feels that the Claritin "runs out".  Review of Systems  Constitutional: Negative for fever, chills and unexpected weight change.  Respiratory: Positive for shortness of breath. Negative for cough and choking.   Cardiovascular: Negative for chest pain and leg swelling.       Objective:  Physical Exam   Filed Vitals:   02/08/12 1339  BP: 100/62  Pulse: 65  Temp: 97.5 F (36.4 C)  TempSrc: Oral  Height: 5\' 5"  (1.651 m)  Weight: 153 lb (69.4 kg)  SpO2: 98%    Gen: well appearing, no acute distress HEENT: NCAT, PERRL, EOMi, OP clear, Neck: supple without masses PULM: few crackles in bases, otherwise clear CV: RRR, slight systolic murmur, no JVD AB: BS+, soft, nontender, no hsm Ext: warm, trace ankle edema, no clubbing, no cyanosis   2011  PFT's: FVC Value: 1.56 L/min Pred: 2.64 L/min % Pred: 59 %  FEV1 Value: 1.28 L Pred: 1.96 L % Pred: 65 %  FEV1/FVC Value: 82 % Pred: 74 % % Pred: 111 %  FEF 25-75 Value: 1.33 L/min Pred: 1.44 L/min % Pred: 92 %  MIP (- ) 40% pred, MEP 46% pred  11/2010 CTA cardiac (with lung windows, my read): R base peripheral/subpleural scarring, scattered GGO greatest in left lower     Assessment & Plan:   Chronic hypoxemic respiratory failure She continues to use and benefit from her 2L O2 continuously.  She is to continue using this.    Chronic diastolic CHF (congestive heart failure) Continue diuretic and BP and Na restriction regimen as noted by Dr. Renee Ramus and Dr. Mariah Milling.  DYSPNEA Improved with diuresis.  As noted previously, she and I agree that her flares of dyspnea are directly related to her CHF and volume overload.  She no doubt has some degree of scarring/fibrosis in the bases of her lungs (likley chronic aspiration, though I've never proven this) and likely some neuromuscular weakness but there is no evidence that this has progressed lately.  We should focus on her fluid, diuretic, and BP management as per Dr. Mariah Milling.  Allergic rhinitis, seasonal I advised that she try switching from Claritin to Zyrtec for a few days.  If this works then we can send an Rx (apparently her insurance wants this even though it is OTC?).  If that doesn't help, then try bid Allegra.    Updated Medication List Outpatient Encounter Prescriptions as of 02/08/2012  Medication Sig Dispense Refill  . ACCU-CHEK SOFTCLIX LANCETS lancets Check blood sugar three times a day and as directed. 250.00  200 each  3  . albuterol (PROVENTIL,VENTOLIN) 90 MCG/ACT inhaler Inhale 2 puffs into the lungs every 6 (six) hours as needed for wheezing.  3 each  3  . aspirin 325 MG tablet Take 325 mg by mouth daily.        Marland Kitchen atorvastatin (LIPITOR) 20 MG tablet TAKE 1 TABLET BY MOUTH NIGHTLY  90 tablet  2  . Blood Glucose  Monitoring Suppl (ACCU-CHEK AVIVA PLUS) W/DEVICE KIT Check blood sugar three times a day and as directed.250.00, insulin dependent      . Casanthranol-Docusate Sodium 30-100 MG CAPS as needed.        . citalopram (CELEXA) 20 MG tablet Take 1 tablet (20 mg total) by mouth daily.  90 tablet  2  . clopidogrel (PLAVIX) 75 MG tablet Take 1 tablet (75 mg total) by mouth daily.  30 tablet  1  . fenofibrate micronized (ANTARA) 130 MG capsule Take 130 mg by mouth daily.        . fluticasone (FLONASE) 50 MCG/ACT nasal spray Place 2 sprays into the nose daily.  48 g  2  . furosemide (LASIX) 40 MG tablet 20mg  alternating with 40mg       . insulin glargine (LANTUS) 100 UNIT/ML injection  Inject 20 Units into the skin every morning.  10 mL  11  . Insulin Pen Needle (PEN NEEDLES 31GX5/16") 31G X 8 MM MISC Use to check blood sugar daily.  Dx:250.00      . isosorbide mononitrate (IMDUR) 60 MG 24 hr tablet Take 60 mg by mouth 2 (two) times daily.      Marland Kitchen loratadine (CLARITIN) 10 MG tablet Take 1 tablet (10 mg total) by mouth daily.  90 tablet  1  . LORazepam (ATIVAN) 2 MG tablet Take 0.5 tablets (1 mg total) by mouth at bedtime as needed. insomnia  30 tablet  3  . metFORMIN (GLUCOPHAGE-XR) 500 MG 24 hr tablet Take 2 tablets (1,000 mg total) by mouth at bedtime.  180 tablet  3  . metoprolol tartrate (LOPRESSOR) 25 MG tablet Take 1 tablet (25 mg total) by mouth 2 (two) times daily.  180 tablet  3  . mirtazapine (REMERON) 15 MG tablet Take 7.5 mg by mouth at bedtime.      . Multiple Vitamin (MULTIVITAMIN) capsule Take 1 capsule by mouth daily.        . nitroGLYCERIN (NITROSTAT) 0.4 MG SL tablet Place 1 tablet (0.4 mg total) under the tongue as needed.  25 tablet  1  . potassium chloride SA (K-DUR,KLOR-CON) 20 MEQ tablet Take 10 mEq by mouth daily.       . prednisoLONE sodium phosphate (INFLAMASE FORTE) 1 % ophthalmic solution Place 1 drop into both eyes daily.       . RABEprazole (ACIPHEX) 20 MG tablet Take 1 tablet (20  mg total) by mouth daily.  90 tablet  3  . RANEXA 1000 MG SR tablet TAKE 1 TABLET BY MOUTH 2 TIMES A DAY  180 tablet  3  . sitaGLIPtin (JANUVIA) 100 MG tablet Take 1 tablet (100 mg total) by mouth daily.  90 tablet  1  . Vitamin D, Ergocalciferol, (DRISDOL) 50000 UNITS CAPS TAKE 1 CAPSULE BY MOUTH ONCE A WEEK  13 capsule  3  . ZETIA 10 MG tablet TAKE 1 TABLET BY MOUTH DAILY  90 tablet  2

## 2012-02-08 NOTE — Patient Instructions (Signed)
Try stopping the claritin and substitute with Zyrtec (cetirizine) for a few days.  If this works better for you we will send in a new Rx.  Keep using the oxygen as you are doing.

## 2012-02-08 NOTE — Assessment & Plan Note (Addendum)
Improved with diuresis.  As noted previously, she and I agree that her flares of dyspnea are directly related to her CHF and volume overload.  She no doubt has some degree of scarring/fibrosis in the bases of her lungs (likley chronic aspiration, though I've never proven this) and likely some neuromuscular weakness but there is no evidence that this has progressed lately.  We should focus on her fluid, diuretic, and BP management as per Dr. Mariah Milling.

## 2012-02-09 ENCOUNTER — Other Ambulatory Visit: Payer: Self-pay | Admitting: Family Medicine

## 2012-02-09 ENCOUNTER — Other Ambulatory Visit: Payer: Self-pay | Admitting: *Deleted

## 2012-02-09 ENCOUNTER — Other Ambulatory Visit: Payer: Self-pay | Admitting: Cardiovascular Disease

## 2012-02-09 MED ORDER — FENOFIBRATE MICRONIZED 130 MG PO CAPS: 130.0000 mg | ORAL_CAPSULE | Freq: Every day | ORAL | Status: DC

## 2012-02-09 NOTE — Telephone Encounter (Signed)
Refilled Isosorbide. 

## 2012-02-09 NOTE — Assessment & Plan Note (Signed)
I advised that she try switching from Claritin to Zyrtec for a few days.  If this works then we can send an Rx (apparently her insurance wants this even though it is OTC?).  If that doesn't help, then try bid Allegra.

## 2012-02-22 ENCOUNTER — Other Ambulatory Visit: Payer: Self-pay | Admitting: Pulmonary Disease

## 2012-02-22 MED ORDER — POTASSIUM CHLORIDE CRYS ER 20 MEQ PO TBCR: 10.0000 meq | EXTENDED_RELEASE_TABLET | Freq: Every day | ORAL | Status: DC

## 2012-03-02 ENCOUNTER — Telehealth: Payer: Self-pay | Admitting: Family Medicine

## 2012-03-02 NOTE — Telephone Encounter (Signed)
Christine Blanchard from [facility] is calling to let the office know she has faxed an order as well as mailed the hard copy of the order that needs Dr Reece Agar signature and to be sent back.  Please return the fax to [phone] and then mail back the hard copy once it is received and signed by MD.

## 2012-03-03 NOTE — Telephone Encounter (Signed)
filled and placed in Kim's box [id number]

## 2012-03-06 NOTE — Telephone Encounter (Signed)
Paperwork faxed and mailed as requested.

## 2012-03-22 ENCOUNTER — Encounter: Payer: Self-pay | Admitting: Cardiovascular Disease

## 2012-03-22 ENCOUNTER — Ambulatory Visit (INDEPENDENT_AMBULATORY_CARE_PROVIDER_SITE_OTHER): Payer: Medicare Other | Admitting: Cardiovascular Disease

## 2012-03-22 VITALS — BP 116/64 | HR 74 | Ht 65.0 in | Wt 149.0 lb

## 2012-03-22 DIAGNOSIS — I509 Heart failure, unspecified: Secondary | ICD-10-CM

## 2012-03-22 DIAGNOSIS — E119 Type 2 diabetes mellitus without complications: Secondary | ICD-10-CM

## 2012-03-22 DIAGNOSIS — E785 Hyperlipidemia, unspecified: Secondary | ICD-10-CM | POA: Diagnosis not present

## 2012-03-22 DIAGNOSIS — I2510 Atherosclerotic heart disease of native coronary artery without angina pectoris: Secondary | ICD-10-CM

## 2012-03-22 DIAGNOSIS — J9610 Chronic respiratory failure, unspecified whether with hypoxia or hypercapnia: Secondary | ICD-10-CM

## 2012-03-22 DIAGNOSIS — I5032 Chronic diastolic (congestive) heart failure: Secondary | ICD-10-CM | POA: Diagnosis not present

## 2012-03-22 DIAGNOSIS — R0602 Shortness of breath: Secondary | ICD-10-CM | POA: Diagnosis not present

## 2012-03-22 DIAGNOSIS — J9611 Chronic respiratory failure with hypoxia: Secondary | ICD-10-CM

## 2012-03-22 MED ORDER — NITROGLYCERIN 0.4 MG SL SUBL: 0.4000 mg | SUBLINGUAL_TABLET | SUBLINGUAL | Status: DC | PRN

## 2012-03-22 NOTE — Assessment & Plan Note (Signed)
Stress test several months ago showing no ischemia. Currently with no symptoms of angina. No further workup at this time. Continue current medication regimen.

## 2012-03-22 NOTE — Assessment & Plan Note (Signed)
She seems stable on chronic oxygen as long as her weight is stable. We have recommended extra diuretic for any worsening shortness of breath.

## 2012-03-22 NOTE — Progress Notes (Signed)
Patient ID: Christine Blanchard, female    DOB: [birth year], 79 y.o.   MRN: [id number]  HPI Comments: Christine Blanchard is a very pleasant 79 year old woman with history of coronary artery disease, bypass surgery in 2004, hyperlipidemia, PCI 6 months after her bypass, repeat PCI one year later (She does report having a stroke after her cardiac catheterization), diabetes, hypertension, spinal stenosis who currently lives in a nursing home in [place], who has chronic unsteady gait  with worsening of her shortness of breath over the past year and  on chronic oxygen who presents for routine followup. Pulmonary workup has revealed restrictive lung disease.  Overall she reports that she has been stable on Lasix 40 mg alternating with 20 mg every other day. She continues on oxygen. Most recent echocardiogram November 2013 showed normal ejection fraction. Weight has been stable at 148 pounds . At home and She has minimal ankle edema Hemoglobin A1c 6.6   cardiac CTA in the past for atypical chest pain that showed: Patent SVG to D1 with poor distal runoff,  Occluded LIMA to LAD.  Distal LAD never visualized,  RCA and circumflex patient with multiple 50% or less calcfic Lesions,  Poor distal runoff from stented IM/LAD and small D1 supplied by SVG  She was seen by Dr. Kendrick Fries pulmonary in October 2013  hospital admission for shortness of breath, had diuresis with Lasix. Echocardiogram and stress test to rule out cardiac cause of shortness of breath. Since her discharge, she has not had any further chest pain. Her breathing has been stable. She requires chronic oxygenation 2-3 L,   Echocardiogram was essentially normal with normal ejection fraction greater than 55%, diastolic dysfunction, normal right ventricular systolic pressures  Stress test showed no ischemia with ejection fraction greater than 55%   12/2011 In the hospital, total cholesterol 146, LDL 58, HDL 65  EKG shows Normal sinus rhythm with rate 74 beats per  minute with poor R-wave progression through the anterior precordial leads, nonspecific T wave abnormality. No significant change from her prior EKG   Outpatient Encounter Prescriptions as of 03/22/2012  Medication Sig Dispense Refill  . ACCU-CHEK SOFTCLIX LANCETS lancets Check blood sugar three times a day and as directed. 250.00  200 each  3  . albuterol (PROVENTIL,VENTOLIN) 90 MCG/ACT inhaler Inhale 2 puffs into the lungs every 6 (six) hours as needed for wheezing.  3 each  3  . aspirin 325 MG tablet Take 325 mg by mouth daily.        Marland Kitchen atorvastatin (LIPITOR) 20 MG tablet TAKE 1 TABLET BY MOUTH NIGHTLY  90 tablet  2  . B-D ULTRAFINE III SHORT PEN 31G X 8 MM MISC USE TO CHECK BLOOD SUGAR 3 TIMES DAILY  3 each  3  . Blood Glucose Monitoring Suppl (ACCU-CHEK AVIVA PLUS) W/DEVICE KIT Check blood sugar three times a day and as directed.250.00, insulin dependent      . Casanthranol-Docusate Sodium 30-100 MG CAPS as needed.        . citalopram (CELEXA) 20 MG tablet Take 1 tablet (20 mg total) by mouth daily.  90 tablet  2  . clopidogrel (PLAVIX) 75 MG tablet Take 1 tablet (75 mg total) by mouth daily.  30 tablet  1  . fenofibrate micronized (ANTARA) 130 MG capsule Take 1 capsule (130 mg total) by mouth daily.  90 capsule  1  . fluticasone (FLONASE) 50 MCG/ACT nasal spray Place 2 sprays into the nose daily.  48 g  2  .  furosemide (LASIX) 40 MG tablet 20mg  alternating with 40mg       . insulin glargine (LANTUS) 100 UNIT/ML injection Inject 20 Units into the skin every morning.  10 mL  11  . isosorbide mononitrate (IMDUR) 60 MG 24 hr tablet TAKE 1 AND 1/2 TABLETS 2 TIMES A DAY  270 tablet  2  . loratadine (CLARITIN) 10 MG tablet Take 1 tablet (10 mg total) by mouth daily.  90 tablet  1  . LORazepam (ATIVAN) 2 MG tablet Take 0.5 tablets (1 mg total) by mouth at bedtime as needed. insomnia  30 tablet  3  . metFORMIN (GLUCOPHAGE-XR) 500 MG 24 hr tablet Take 2 tablets (1,000 mg total) by mouth at bedtime.   180 tablet  3  . metoprolol tartrate (LOPRESSOR) 25 MG tablet Take 1 tablet (25 mg total) by mouth 2 (two) times daily.  180 tablet  3  . mirtazapine (REMERON) 15 MG tablet Take 7.5 mg by mouth at bedtime.      . Multiple Vitamin (MULTIVITAMIN) capsule Take 1 capsule by mouth daily.        . nitroGLYCERIN (NITROSTAT) 0.4 MG SL tablet Place 1 tablet (0.4 mg total) under the tongue as needed.  25 tablet  1  . potassium chloride SA (K-DUR,KLOR-CON) 20 MEQ tablet Take 0.5 tablets (10 mEq total) by mouth daily. Take 1 tablet daily only when you take lasix  90 tablet  1  . prednisoLONE sodium phosphate (INFLAMASE FORTE) 1 % ophthalmic solution Place 1 drop into both eyes daily.       . RABEprazole (ACIPHEX) 20 MG tablet Take 1 tablet (20 mg total) by mouth daily.  90 tablet  3  . RANEXA 1000 MG SR tablet TAKE 1 TABLET BY MOUTH 2 TIMES A DAY  180 tablet  3  . sitaGLIPtin (JANUVIA) 100 MG tablet Take 1 tablet (100 mg total) by mouth daily.  90 tablet  1  . Vitamin D, Ergocalciferol, (DRISDOL) 50000 UNITS CAPS TAKE 1 CAPSULE BY MOUTH ONCE A WEEK  13 capsule  3  . ZETIA 10 MG tablet TAKE 1 TABLET BY MOUTH DAILY  90 tablet  2   No facility-administered encounter medications on file as of 03/22/2012.    Review of Systems  Constitutional: Negative for fever and unexpected weight change.  HENT: Negative.   Eyes: Negative.   Respiratory: Positive for shortness of breath. Negative for apnea, cough, choking, chest tightness, wheezing and stridor.   Cardiovascular: Negative.   Gastrointestinal: Negative.   Musculoskeletal: Positive for gait problem.       Profound leg weakness  Skin: Negative.   Neurological: Positive for weakness.  Psychiatric/Behavioral: Negative.   All other systems reviewed and are negative.    BP 116/64  Pulse 74  Ht 5\' 5"  (1.651 m)  Wt 149 lb (67.586 kg)  BMI 24.79 kg/m2  Physical Exam  Nursing note and vitals reviewed. Constitutional: She is oriented to person, place, and  time. She appears well-developed and well-nourished.  On chronic oxygen nasal cannula  HENT:  Head: Normocephalic.  Nose: Nose normal.  Mouth/Throat: Oropharynx is clear and moist.  Eyes: Conjunctivae are normal. Pupils are equal, round, and reactive to light.  Neck: Normal range of motion. Neck supple. No JVD present.  Cardiovascular: Normal rate, regular rhythm, S1 normal, S2 normal and intact distal pulses.  Exam reveals no gallop and no friction rub.   Murmur heard.  Crescendo systolic murmur is present with a grade of 2/6  Pulmonary/Chest: Effort normal. No respiratory distress. She has decreased breath sounds. She has no wheezes. She has no rales. She exhibits no tenderness.  Abdominal: Soft. Bowel sounds are normal. She exhibits no distension. There is no tenderness.  Musculoskeletal: Normal range of motion. She exhibits no tenderness.  Lymphadenopathy:    She has no cervical adenopathy.  Neurological: She is alert and oriented to person, place, and time. Coordination normal.  Skin: Skin is warm and dry. No rash noted. No erythema.  Psychiatric: She has a normal mood and affect. Her behavior is normal. Judgment and thought content normal.    Assessment and Plan

## 2012-03-22 NOTE — Assessment & Plan Note (Signed)
Cholesterol is at goal on the current lipid regimen. No changes to the medications were made.  

## 2012-03-22 NOTE — Patient Instructions (Addendum)
You are doing well. No medication changes were made.  Take an extra lasix as needed for worsening shortness of breath or edema Keep your weight steady  Please call us if you have new issues that need to be addressed before your next appt.  Your physician wants you to follow-up in: 6 months.  You will receive a reminder letter in the mail two months in advance. If you don't receive a letter, please call our office to schedule the follow-up appointment.

## 2012-03-22 NOTE — Assessment & Plan Note (Signed)
Hemoglobin A1c 6.6. We have encouraged her to continue what she is doing.

## 2012-03-22 NOTE — Assessment & Plan Note (Signed)
Weight is stable. We have suggested she take extra Lasix as needed for weight gain or shortness of breath otherwise continue Lasix 40 mg alternating with 20 mg.

## 2012-03-24 ENCOUNTER — Encounter: Payer: Self-pay | Admitting: Cardiovascular Disease

## 2012-03-29 ENCOUNTER — Telehealth: Payer: Self-pay

## 2012-03-29 ENCOUNTER — Telehealth: Payer: Self-pay | Admitting: *Deleted

## 2012-03-29 ENCOUNTER — Encounter: Payer: Self-pay | Admitting: Family Medicine

## 2012-03-29 NOTE — Telephone Encounter (Signed)
Spoke with pt and she mentioned that Dr. Mariah Milling told her to take nitro as needed for angina. She mentioned that it is cheaper for her to get 90 day supply. Pt did not want me to send in refill for nitro until [facility] fax [place] request for refill. She just wanted [place] to know for future reference when requested.

## 2012-03-29 NOTE — Telephone Encounter (Signed)
Pt called stating that she just received her Nytrostat in the mail and it is not for 90 days. needs to be for 90 days. States [facility] will be faxing request.

## 2012-03-30 ENCOUNTER — Other Ambulatory Visit: Payer: Self-pay | Admitting: *Deleted

## 2012-03-30 MED ORDER — NITROGLYCERIN 0.4 MG SL SUBL: 0.4000 mg | SUBLINGUAL_TABLET | SUBLINGUAL | Status: DC | PRN

## 2012-03-30 NOTE — Telephone Encounter (Signed)
Refilled Nitrostat 90 day supply sent to [facility].

## 2012-04-01 ENCOUNTER — Encounter: Payer: Self-pay | Admitting: Pulmonary Disease

## 2012-04-03 MED ORDER — CETIRIZINE HCL 10 MG PO TABS: 10.0000 mg | ORAL_TABLET | Freq: Every day | ORAL | Status: DC

## 2012-04-03 NOTE — Telephone Encounter (Signed)
Per 1.7.14 ov w/ BQ:  Patient Instructions    Try stopping the claritin and substitute with Zyrtec (cetirizine) for a few days. If this works better for you we will send in a new Rx.  Keep using the oxygen as you are doing.    Claritin removed from pt's med list Rx for zyrtec sent to [facility] Pt notified via mychart Will sign and forward to BQ as FYI

## 2012-04-06 ENCOUNTER — Telehealth: Payer: Self-pay | Admitting: Pulmonary Disease

## 2012-04-06 NOTE — Telephone Encounter (Signed)
Spoke with pt She states that her portable o2 tank stopped working  She still has her concentrator, but this plugs in to the wall She is needing portable tank to go to a planned event tonight She uses [employer] and was told by them, that she could get a portable tank in the am I have called Synetta Fail with [employer] per Rhonda's advise, and she will call the pt and see what she can do She will call me back with an upate

## 2012-04-06 NOTE — Telephone Encounter (Signed)
Spoke with Synetta Fail again She and another sales rep are going to take the pt a tank tonight! Pt aware Nothing further needed

## 2012-04-09 ENCOUNTER — Other Ambulatory Visit: Payer: Self-pay | Admitting: Family Medicine

## 2012-04-09 DIAGNOSIS — E119 Type 2 diabetes mellitus without complications: Secondary | ICD-10-CM

## 2012-04-11 ENCOUNTER — Other Ambulatory Visit (INDEPENDENT_AMBULATORY_CARE_PROVIDER_SITE_OTHER): Payer: Medicare Other

## 2012-04-11 DIAGNOSIS — E119 Type 2 diabetes mellitus without complications: Secondary | ICD-10-CM

## 2012-04-11 LAB — BASIC METABOLIC PANEL
BUN: 20 mg/dL (ref 6–23)
CO2: 31 mEq/L (ref 19–32)
Calcium: 9.4 mg/dL (ref 8.4–10.5)
Creatinine, Ser: 1.1 mg/dL (ref 0.4–1.2)
Glucose, Bld: 118 mg/dL — ABNORMAL HIGH (ref 70–99)

## 2012-04-11 LAB — MICROALBUMIN / CREATININE URINE RATIO: Microalb, Ur: 1.3 mg/dL (ref 0.0–1.9)

## 2012-04-18 ENCOUNTER — Ambulatory Visit (INDEPENDENT_AMBULATORY_CARE_PROVIDER_SITE_OTHER): Payer: Medicare Other | Admitting: Family Medicine

## 2012-04-18 ENCOUNTER — Encounter: Payer: Self-pay | Admitting: Family Medicine

## 2012-04-18 VITALS — BP 112/64 | HR 64 | Temp 97.7°F

## 2012-04-18 DIAGNOSIS — G4700 Insomnia, unspecified: Secondary | ICD-10-CM

## 2012-04-18 DIAGNOSIS — E1129 Type 2 diabetes mellitus with other diabetic kidney complication: Secondary | ICD-10-CM

## 2012-04-18 DIAGNOSIS — E1121 Type 2 diabetes mellitus with diabetic nephropathy: Secondary | ICD-10-CM

## 2012-04-18 NOTE — Assessment & Plan Note (Signed)
Longstanding sleep initiation and maintenance insomnia. Discussed sleep hygiene. Doing well with low dose ativan - continue this. Consider trazodone in future.

## 2012-04-18 NOTE — Patient Instructions (Addendum)
Let's decrease lantus to 18 units nightly. Keep an eye on the blood sugars. Update me with any questions or concerns. Return in 4 months for follow up Good to see you today, call us with questions.

## 2012-04-18 NOTE — Assessment & Plan Note (Signed)
With mild neuropathy on exam today and evidence of mild renal insufficiency on blood work. Overall stable. However, endorses occasional low sugars - recommended decrease lantus to 18u daily, monitor other meds. Discussed I'm ok with her A1c creeping up past 7%, if this means no hypoglycemic episodes.

## 2012-04-18 NOTE — Progress Notes (Signed)
Subjective:    Patient ID: Christine Blanchard, female    DOB: 1929/01/09, 79 y.o.   MRN: [id number]  HPI CC: f/u  Presents with daughter, Christine Blanchard.   Mrs Shiffman is a pleasant 79 yo with h/o HTN, DM, h/o CVA after cath, severe CAD s/p multiple interventions now planned medical management, diastolic CHF.  Insomnia - controlled with ativan 1/2 tablet nightly.  Also on remeron 7.5mg  nightly.  Discussed sleep hygiene.  Does have bedtime routine.  DM - eye exam pending with [facility] in next few months.  Last visit around 07/2011. Some low blood sugars - without hypoglycemic sxs except for confusion.  Checks blood sugars tid.  Highest #s 198.  Lowest #s 69 (3 in last month). Compliant with meds.  Did not bring log today. Wt Readings from Last 3 Encounters:  03/22/12 149 lb (67.586 kg)  02/08/12 153 lb (69.4 kg)  02/07/12 151 lb 12 oz (68.833 kg)    Past Medical History  Diagnosis Date  . Hypertension   . Diabetes mellitus 2003    previously saw Dr. Leslie Dales  . Hyperlipidemia   . History of chicken pox     has had shingles shot  . History of diverticulitis of colon 1991, 1992, 1994  . History of kidney stones 1952  . CVA (cerebral infarction)     after a stent, minimal R residual weakness, affects speech when tired, ?mild dysequilibrium  . Urinary incontinence     stress/urge, per D. Tannenbaum/[place]  . CAD (coronary artery disease)     severe, s/p 4 cardiac caths, minimally invasive CABG @[facility] , ICA stent 2004 then LAD stent 2005, only medical management recommended now  . Breathing difficulty 2011    on O2 since 10/2009  . Syncope 2005    s/p w/u including CT scan and 24 hour Holter  . GERD (gastroesophageal reflux disease) 2004  . Macular degeneration     s/p corneal transplants  . Benign essential tremor     toprol XL and remeron  . Herpes zoster ophthalmicus 2009    history  . Hearing loss of both ears 08/2010    audiology eval 08/2010 - mod sensorineural heaing loss, rec trial digital  hearing aids and re eval yearly to monitor  . Vitamin D deficiency     on 50k units weekly  . Diastolic CHF 03/2010    grade 1, normal EF, mildly dilated LA, normal PA pressures  . Lumbar spinal stenosis     with severe spondylosis, s/p laminotomy  . Allergic rhinitis      Review of Systems Per HPI    Objective:   Physical Exam  Nursing note and vitals reviewed. Constitutional: She appears well-developed and well-nourished. No distress.  [place] on  HENT:  Head: Normocephalic and atraumatic.  Mouth/Throat: Oropharynx is clear and moist. No oropharyngeal exudate.  Eyes: Conjunctivae and EOM are normal. Pupils are equal, round, and reactive to light. No scleral icterus.  Neck: Normal range of motion. Neck supple.  Cardiovascular: Normal rate, regular rhythm, normal heart sounds and intact distal pulses.   No murmur heard. Pulmonary/Chest: Effort normal and breath sounds normal. No respiratory distress. She has no wheezes. She has no rales.  Musculoskeletal: She exhibits no edema.  Diabetic foot exam: Normal inspection No skin breakdown Calluses, esp on left distal toes Normal DP/PT pulses Normal sensation to light touch but decreased to monofilament Nails normal   Skin: Skin is warm and dry. No rash noted.  Psychiatric: She has  a normal mood and affect.  bright affect       Assessment & Plan:

## 2012-04-19 ENCOUNTER — Encounter: Payer: Self-pay | Admitting: Family Medicine

## 2012-04-19 MED ORDER — LORAZEPAM 2 MG PO TABS: 1.0000 mg | ORAL_TABLET | Freq: Every evening | ORAL | Status: DC | PRN

## 2012-04-19 NOTE — Telephone Encounter (Signed)
Rx faxed and patient notified.  

## 2012-04-19 NOTE — Telephone Encounter (Signed)
plz [facility]ep[facility].  Thanks.  Placed in Christine Blanchard's box

## 2012-04-21 ENCOUNTER — Other Ambulatory Visit: Payer: Self-pay | Admitting: Family Medicine

## 2012-05-02 ENCOUNTER — Encounter: Payer: Self-pay | Admitting: Pulmonary Disease

## 2012-05-02 ENCOUNTER — Encounter: Payer: Self-pay | Admitting: Family Medicine

## 2012-05-02 ENCOUNTER — Other Ambulatory Visit: Payer: Self-pay | Admitting: Family Medicine

## 2012-05-02 NOTE — Telephone Encounter (Signed)
Per pt's chart, Verlon Au helped pt on 3.6.14 As Leslie's name is listed on her employee badge, email sent to pt  Nothing further needed; will sign off

## 2012-05-03 ENCOUNTER — Other Ambulatory Visit: Payer: Self-pay | Admitting: Family Medicine

## 2012-05-03 MED ORDER — INSULIN PEN NEEDLE 31G X 8 MM MISC: Status: DC

## 2012-05-07 ENCOUNTER — Other Ambulatory Visit: Payer: Self-pay | Admitting: Family Medicine

## 2012-05-19 ENCOUNTER — Telehealth: Payer: Self-pay

## 2012-05-19 NOTE — Telephone Encounter (Signed)
Pt called and states her weight was 148 this a.m. States was 145 yesterday, please advise weight gain. She knows she needs to increase her Lasix, but needs advice on how much.

## 2012-05-19 NOTE — Telephone Encounter (Signed)
Pt reports weight gain of 3 pounds overnight Denies worsening sob, but admits to some edema Says she "overdid it" yesterday after having a bday celebration with 8 other women. Had some angina yesterday for which she has NTG and takes Ranexa Pain free today Main compliant is weight gain Taking lasix 20 mg alternating with 40 mg QOD. Today she took 40 mg lasix. I advised her to take 40 mg x 2 days and see if this helps weight gain I will call her back 4/21 to reassess Understanding verb

## 2012-05-22 ENCOUNTER — Telehealth: Payer: Self-pay

## 2012-05-22 NOTE — Telephone Encounter (Signed)
Assess weight gain

## 2012-05-22 NOTE — Telephone Encounter (Signed)
Please see below and advise. thanks 

## 2012-05-22 NOTE — Telephone Encounter (Signed)
I called to assess pt's weight since she has been taking lasix 40 mg daily over w/e She says weight has not decreased, edema is same, but she tells me she ate Easter lunch with family yesterday and had some ham.  She thinks this is a contributing factor. She also tells me she went to church Friday night for an [employer] service which was "very emotional", she became diaphoretic and had CP. She took NTG x 5 before the pain subsided She has not had any further chest pain in 3 days. I told her to keep taking the lasix 40 mg daily today and tomm and I will reassess fluid status Wednesday 05/24/12, but I am also going to make Dr. Mariah Milling aware of episode of CP and diaphoresis Understanding verb

## 2012-05-24 ENCOUNTER — Telehealth: Payer: Self-pay

## 2012-05-24 NOTE — Telephone Encounter (Signed)
I called pt to assess weight and CP She says weight is down 2 pounds, has only had 1 other episode of "light" chest pain; went to a funeral today She will decrease lasix back to original dose of 20 mg alternating with 40 mg QOD. She will let us know should she develop further chest discomfort./ she states "If I have chest pain like I had Friday night all the time,I would go to the hospital to get some relief" I encouraged her to do this if her CP returns

## 2012-05-24 NOTE — Telephone Encounter (Signed)
Assess weight and CP

## 2012-06-02 ENCOUNTER — Telehealth: Payer: Self-pay

## 2012-06-02 NOTE — Telephone Encounter (Signed)
Pt states he weight is up to 150. States she is supposed to call  When it goes up. Please call.

## 2012-06-02 NOTE — Telephone Encounter (Signed)
Pt reports weight up 3 pounds overnight and is associated with some LE edema taking lasix 40 mg alternating with 20 mg QOD Today she took 40 mg  Last BMP was WNL I advised ok to take an extra 20 mg lasix today then take 40 mg lasix QD over w/e and will reassess Monday 5/5 Understanding verb

## 2012-06-02 NOTE — Telephone Encounter (Signed)
fyi

## 2012-06-05 ENCOUNTER — Telehealth: Payer: Self-pay

## 2012-06-05 NOTE — Telephone Encounter (Signed)
Pt reports edema and weight have improved with extra lasix over w/e She will resume previous lasix dosing  Her main complaint is having "no energy" x 1 week I advised she come in for labs (BMP, etc) but she declines since she recently had this done with PCP in March She states ,"maybe I am just supposed to feel this way" She tells me it has been going on longer than 1 week, just gradually getting worse Denies any other symptoms  I told her I would make Dr. Mariah Milling aware of her symptoms and call her back with his advice Understanding verb

## 2012-06-05 NOTE — Telephone Encounter (Signed)
Assess weight/edema

## 2012-06-09 ENCOUNTER — Telehealth: Payer: Self-pay

## 2012-06-09 NOTE — Telephone Encounter (Signed)
Pt states her weight is up again - 150, should be 145, and her ankles are swelling. Pt is also SOB.

## 2012-06-09 NOTE — Telephone Encounter (Signed)
Pt reports sob, edema, and weight gain of 5 pounds.  Taking furosemide 40 mg alternating with 20 mg QOD.  Took 40 mg today. I advised ok to take an extra 20 mg lasix today then take 40 mg daily through w/e and should f/u with [place] Monday 5/12 to discuss why she is retaining fluid so frequently, etc.  Understanding verb

## 2012-06-12 ENCOUNTER — Encounter: Payer: Self-pay | Admitting: Physician Assistant

## 2012-06-12 ENCOUNTER — Ambulatory Visit (INDEPENDENT_AMBULATORY_CARE_PROVIDER_SITE_OTHER): Payer: Medicare Other | Admitting: Physician Assistant

## 2012-06-12 VITALS — BP 110/70 | HR 72 | Ht 65.0 in | Wt 148.0 lb

## 2012-06-12 DIAGNOSIS — E785 Hyperlipidemia, unspecified: Secondary | ICD-10-CM

## 2012-06-12 DIAGNOSIS — I10 Essential (primary) hypertension: Secondary | ICD-10-CM

## 2012-06-12 DIAGNOSIS — R0602 Shortness of breath: Secondary | ICD-10-CM

## 2012-06-12 DIAGNOSIS — I5032 Chronic diastolic (congestive) heart failure: Secondary | ICD-10-CM | POA: Diagnosis not present

## 2012-06-12 DIAGNOSIS — I2510 Atherosclerotic heart disease of native coronary artery without angina pectoris: Secondary | ICD-10-CM | POA: Diagnosis not present

## 2012-06-12 DIAGNOSIS — I509 Heart failure, unspecified: Secondary | ICD-10-CM

## 2012-06-12 MED ORDER — POTASSIUM CHLORIDE CRYS ER 20 MEQ PO TBCR: 10.0000 meq | EXTENDED_RELEASE_TABLET | Freq: Every day | ORAL | Status: DC

## 2012-06-12 MED ORDER — FUROSEMIDE 40 MG PO TABS: 40.0000 mg | ORAL_TABLET | Freq: Every day | ORAL | Status: DC

## 2012-06-12 NOTE — Assessment & Plan Note (Signed)
No subjective or objective indication of ischemia. Stable. Continue ASA, Plavix, BB, statin, Imdur, Ranexa, NTG SL PRN.

## 2012-06-12 NOTE — Assessment & Plan Note (Signed)
Well-controlled. Continue current antihypertensives.  

## 2012-06-12 NOTE — Progress Notes (Signed)
Patient ID: Christine Blanchard, female    DOB: [birth year], 79 y.o.   MRN: [id number]  HPI Comments: Christine Blanchard is a very pleasant 79 year old woman with history of CAD s/p CABG in 2004, PCI 6 months post-CABG, repeat PCI one year later (c/b CVA post-cath), chronic diastolic CHF, CKD (stage III), type 2 DM, hyperlipidemia, hypertension, spinal stenosis and chronic respiratory failure (O2 dependent, restrictive lung disease by pulmonary work-up). She resides in the independent portion of a ALF in [place].   Cardiac CTA 11/2010 atypical chest pain showed: Patent SVG to D1 with poor distal runoff,  Occluded LIMA to LAD.  Distal LAD never visualized,  RCA and circumflex patient with multiple 50% or less calcfic Lesions,  Poor distal runoff from stented IM/LAD and small Stress test showed no ischemia with ejection fraction greater than 55% in 12/2011  Echocardiogram 12/2011 was essentially normal with normal ejection fraction greater than 55%, diastolic dysfunction, normal right ventricular systolic pressures  Last seen by Dr. Mariah Milling 03/2012. Plan was to continue Lasix 20-40mg  QOD. This had been maintaining her baseline weight relatively well (145-148 lbs). Since then, she has noted progressively increased weight gain (150 lbs), abdominal distention and worsening SOB/DOE. She has periodically increased Lasix as a result with increased diuresis and improvement in these symptoms. She notes this has happened several times in a cyclical manner, and wonders if she should be on a higher dosing regimen. The most recent of these episodes occurred on Friday, and she was advised to increase her Lasix. Abdominal distention and shortness of breath have improved. Weigh today 148 lbs. She cooks 2/3 meals at home. Lunch is provided by the ALF, and the salt content of a particular meal may be unknown. She denies chest pain or arrhythmia. BP well-controlled at home. Does take Lasix near meals. She has been fairly active this  weekend, and tolerated this well without incident. No recent fevers, chills, new cough, urinary changes, diarrhea or active bleeding. Feels well overall today.   EKG today shows NSR, diffuse TW flattening, LAD, no ST changes.  BP 110/70.   Outpatient Encounter Prescriptions as of 06/12/2012  Medication Sig Dispense Refill  . ACCU-CHEK SOFTCLIX LANCETS lancets Check blood sugar three times a day and as directed. 250.00  200 each  3  . albuterol (PROVENTIL,VENTOLIN) 90 MCG/ACT inhaler Inhale 2 puffs into the lungs every 6 (six) hours as needed for wheezing.  3 each  3  . aspirin 325 MG tablet Take 325 mg by mouth daily.        Marland Kitchen atorvastatin (LIPITOR) 20 MG tablet TAKE 1 TABLET BY MOUTH NIGHTLY  90 tablet  2  . Blood Glucose Monitoring Suppl (ACCU-CHEK AVIVA PLUS) W/DEVICE KIT Check blood sugar three times a day and as directed.250.00, insulin dependent      . Casanthranol-Docusate Sodium 30-100 MG CAPS as needed.        . cetirizine (ZYRTEC ALLERGY) 10 MG tablet Take 1 tablet (10 mg total) by mouth daily.  90 tablet  3  . citalopram (CELEXA) 20 MG tablet TAKE 1 TABLET DAILY  90 tablet  3  . clopidogrel (PLAVIX) 75 MG tablet Take 1 tablet (75 mg total) by mouth daily.  30 tablet  1  . fenofibrate micronized (ANTARA) 130 MG capsule Take 1 capsule (130 mg total) by mouth daily.  90 capsule  1  . fluticasone (FLONASE) 50 MCG/ACT nasal spray Place 2 sprays into the nose daily.  48 g  2  .  furosemide (LASIX) 40 MG tablet 20mg  alternating with 40mg       . insulin glargine (LANTUS SOLOSTAR) 100 UNIT/ML injection Inject 0.18 mLs (18 Units total) into the skin daily.  2 pen  6  . Insulin Pen Needle (B-D ULTRAFINE III SHORT PEN) 31G X 8 MM MISC Use as directed  100 each  3  . isosorbide mononitrate (IMDUR) 60 MG 24 hr tablet       . JANUVIA 100 MG tablet TAKE 1 TABLET (100 MG TOTAL) DAILY  90 tablet  3  . LORazepam (ATIVAN) 2 MG tablet Take 0.5 tablets (1 mg total) by mouth at bedtime as needed. insomnia   45 tablet  3  . metFORMIN (GLUCOPHAGE-XR) 500 MG 24 hr tablet Take 2 tablets (1,000 mg total) by mouth at bedtime.  180 tablet  3  . metoprolol tartrate (LOPRESSOR) 25 MG tablet Take 1 tablet (25 mg total) by mouth 2 (two) times daily.  180 tablet  3  . mirtazapine (REMERON) 15 MG tablet Take 7.5 mg by mouth at bedtime.      . Multiple Vitamin (MULTIVITAMIN) capsule Take 1 capsule by mouth daily.        . nitroGLYCERIN (NITROSTAT) 0.4 MG SL tablet Place 1 tablet (0.4 mg total) under the tongue as needed.  75 tablet  3  . potassium chloride SA (K-DUR,KLOR-CON) 20 MEQ tablet Take 0.5 tablets (10 mEq total) by mouth daily. Take 1 tablet daily only when you take lasix  90 tablet  1  . prednisoLONE sodium phosphate (INFLAMASE FORTE) 1 % ophthalmic solution Place 1 drop into both eyes daily.       . RABEprazole (ACIPHEX) 20 MG tablet Take 1 tablet (20 mg total) by mouth daily.  90 tablet  3  . RANEXA 1000 MG SR tablet TAKE 1 TABLET BY MOUTH 2 TIMES A DAY  180 tablet  3  . Vitamin D, Ergocalciferol, (DRISDOL) 50000 UNITS CAPS TAKE 1 CAPSULE BY MOUTH ONCE A WEEK  13 capsule  3  . ZETIA 10 MG tablet TAKE 1 TABLET BY MOUTH DAILY  90 tablet  2   No facility-administered encounter medications on file as of 06/12/2012.    Review of Systems  Constitutional: Positive for fatigue and unexpected weight change. Negative for fever.  HENT: Negative.   Eyes: Negative.   Respiratory: Positive for shortness of breath. Negative for apnea, cough, choking, chest tightness, wheezing and stridor.   Cardiovascular: Negative.  Negative for chest pain, palpitations and leg swelling.  Gastrointestinal: Positive for abdominal distention.  Musculoskeletal: Positive for gait problem.       Profound leg weakness  Skin: Negative.   Neurological: Positive for weakness.  Psychiatric/Behavioral: Negative.   All other systems reviewed and are negative.    BP 110/70  Pulse 72  Ht 5\' 5"  (1.651 m)  Wt 148 lb (67.132 kg)  BMI  24.63 kg/m2  Physical Exam  Nursing note and vitals reviewed. Constitutional: She is oriented to person, place, and time. She appears well-developed and well-nourished.  On chronic oxygen nasal cannula  HENT:  Head: Normocephalic.  Nose: Nose normal.  Mouth/Throat: Oropharynx is clear and moist.  Eyes: Conjunctivae are normal. Pupils are equal, round, and reactive to light.  Neck: Normal range of motion. Neck supple. No JVD present.  Cardiovascular: Normal rate, regular rhythm, S1 normal, S2 normal and intact distal pulses.  Exam reveals no gallop and no friction rub.   Murmur heard.  Crescendo systolic murmur is present  with a grade of 2/6  Pulmonary/Chest: Effort normal. No respiratory distress. She has decreased breath sounds. She has no wheezes. She has rales. She exhibits no tenderness.  Abdominal: Soft. Bowel sounds are normal. She exhibits no distension. There is no tenderness.  Musculoskeletal: Normal range of motion. She exhibits no tenderness.  Trace bilateral pedal edema  Lymphadenopathy:    She has no cervical adenopathy.  Neurological: She is alert and oriented to person, place, and time. Coordination normal.  Skin: Skin is warm and dry. No rash noted. No erythema.  Psychiatric: She has a normal mood and affect. Her behavior is normal. Judgment and thought content normal.    Assessment and Plan

## 2012-06-12 NOTE — Patient Instructions (Addendum)
Please increase Lasix to 40mg  daily. Take one tablet of potassium daily. Continue to monitor your weights daily. Please restrict salt and fluid intact. Please wear compression stockings daily.   Please call the office for increased weight gain, shortness of breath or swelling. If these symptoms continue, we will consider switching to an alternative fluid pill called torsemide.   We will see you back in 2 weeks.

## 2012-06-12 NOTE — Assessment & Plan Note (Signed)
Suspect gradual, slow onset fluid accumulation is attributed to sub-optimal scheduled diuretic treatment. Will increase Lasix to 40mg  daily. Advised to take on an empty stomach to increase absorption. This may be a limiting factor, and if volume is not better controlled with this change, will need to consider switching to equivalent dose of torsemide. Another limiting factor is her CKD. Will need to be cautious with over-diuresis. Alternative contributing causes include occasional dietary indiscretion and over-exertion. We will see her back in 2 weeks to reassess volume and renal status. CHF management/monitoring revisited and discussed.

## 2012-06-12 NOTE — Assessment & Plan Note (Signed)
Continue statin, fibrate.

## 2012-06-13 ENCOUNTER — Encounter: Payer: Self-pay | Admitting: Pulmonary Disease

## 2012-06-13 ENCOUNTER — Ambulatory Visit (INDEPENDENT_AMBULATORY_CARE_PROVIDER_SITE_OTHER): Payer: Medicare Other | Admitting: Pulmonary Disease

## 2012-06-13 VITALS — BP 112/62 | HR 76 | Temp 98.2°F | Ht 65.0 in | Wt 149.0 lb

## 2012-06-13 DIAGNOSIS — J9611 Chronic respiratory failure with hypoxia: Secondary | ICD-10-CM

## 2012-06-13 DIAGNOSIS — R0602 Shortness of breath: Secondary | ICD-10-CM

## 2012-06-13 DIAGNOSIS — J9610 Chronic respiratory failure, unspecified whether with hypoxia or hypercapnia: Secondary | ICD-10-CM

## 2012-06-13 NOTE — Patient Instructions (Signed)
If you are still having fatigue after two weeks of lasix, then call us and we will order a sleep study for you  Keep using your medicines and oxygen as you are doing  We will see you back in 6 months unless we hear from you about the sleep study

## 2012-06-13 NOTE — Assessment & Plan Note (Signed)
I think the recent increase in dyspnea and fatigue is likely related to volume overload. However, given her restrictive lung disease, respiratory muscle weakness and CHF I question whether or not there is a component of obstructive sleep apnea contributing to her severe fatigue throughout the day. She has never had a sleep study. Unfortunately because she uses oxygen we cannot perform a home sleep study.  Plan: -I agree with increased Lasix per Dr. Mariah Milling -If no improvement with fatigue despite adequate diuresis would order a polysomnogram

## 2012-06-13 NOTE — Progress Notes (Signed)
Subjective:    Patient ID: Christine Blanchard, female    DOB: 1928/07/08, 79 y.o.   MRN: [id number]  Synopsis: 80 y/o female with coronary artery disease and chronic angina who has been followed by [facility] in [facility] (by Dr. Lamonte Sakai) for years and [facility] in [place] since 02/2011 for chronic dyspnea and hypoxemic respiratory failure.  Objectively she has both restrictive and obstructive disease and low MIP and MEP. She also likely has some degree of chronic aspiration.  She has severe coronary artery disease with chronic angina.  HPI 03/10/11 ROV -- Christine Blanchard reports to me that she is doing "great". She states that she just finished two months of physical therapy and feels better after doing so.  Her shortness of breath has been manageable lately.  She still notices that she chokes on some food, specifically "crumbly" foods like popcorn and cornbread.  Swelling has improved some.    06/14/11 ROV-- Christine Blanchard says that she likes pulmonary rehab and is able to exercise more than when she started.  She says that she really likes the staff there and says they are pushing her more. She feels physically stronger but is tired at home afterwards.  Her chronic angina is still limiting her somewhat.  She has days where she takes multiple nitroglyerin tabs.  She has not had fever, chills, or cough.  She is noting difficulty with sleeping.  Specifically she can't fall asleep after lying down until about 3AM.  She does not use caffeinated beverages other than some tea with lunch.  Her swelling has improved on her current dose of lasix.  11/03/11 ROV -- Christine Blanchard states that she had some breathing trouble a few weeks back when the weather was hot. Specifically she had itching eyes and sinus congestion. She took Zyrtec in addition to the Claritin and said that it made this better. While the sinus symptoms and itchy eyes have improved she states that she feels that she feels generally weaker than when I last saw her.  Specifically she says she has difficulty with activities of daily living around the house such as making her bed or bringing in groceries. She does not have a new cough. She does have dyspnea when performing these activities and associated chest pain. She states that her angina has been more frequent and longer lasting. She has been advised by Dr. Rockey Situ to continue taking nitrates for pain. She has not been swelling in her legs more.  02/08/2012 ROV -- Christine Blanchard was recently admitted to [facility] for a CHF exacerbation. It sounds like she stayed in the hospital for about 5 or 6 days and required IV diuretics. She had a stress test which was negative during that hospitalization but she states she has had multiple nuclear, chemical stress test in the past which have been negative despite her known severe coronary disease. She's had multiple sick family members lately including a son who has H1 N1 influenza. She was unable to see them around the holidays because of this. Since discharge from the hospital her leg swelling, and shortness of breath are much better. She is still having some frustration with her sinus medications in that in the evenings she feels that the Claritin "runs out".  06/13/12 ROV -- Christine Blanchard says that she has been quite fatigued lately. She takes an Ativan at night and sleeps well and wakes up feeling fairly well rested. However shortly after breakfast she starts feeling significant fatigue. She does not feel particularly sleepy and  she does not nap during the day. However she states that her energy is much worse than previously. Her cough is stable. Her sinus problems are well controlled on Zyrtec. She states that she has been having increasing swelling in her abdomen lately.  She is supposed to start taking Lasix daily as of yesterday.  Past Medical History  Diagnosis Date  . Hypertension   . Diabetes mellitus 2003    previously saw Dr. Leslie Dales  . Hyperlipidemia   . History of chicken pox      has had shingles shot  . History of diverticulitis of colon 1991, 1992, 1994  . History of kidney stones 1952  . CVA (cerebral infarction)     after a stent, minimal R residual weakness, affects speech when tired, ?mild dysequilibrium  . Urinary incontinence     stress/urge, per D. Tannenbaum/Lomax  . CAD (coronary artery disease)     severe, s/p 4 cardiac caths, minimally invasive CABG @[facility] , ICA stent 2004 then LAD stent 2005, only medical management recommended now  . Breathing difficulty 2011    on O2 since 10/2009  . Syncope 2005    s/p w/u including CT scan and 24 hour Holter  . GERD (gastroesophageal reflux disease) 2004  . Macular degeneration     s/p corneal transplants  . Benign essential tremor     toprol XL and remeron  . Herpes zoster ophthalmicus 2009    history  . Hearing loss of both ears 08/2010    audiology eval 08/2010 - mod sensorineural heaing loss, rec trial digital hearing aids and re eval yearly to monitor  . Vitamin D deficiency     on 50k units weekly  . Diastolic CHF 03/2010    grade 1, normal EF, mildly dilated LA, normal PA pressures  . Lumbar spinal stenosis     with severe spondylosis, s/p laminotomy  . Allergic rhinitis      Review of Systems  Constitutional: Negative for fever, chills and unexpected weight change.  Respiratory: Positive for shortness of breath. Negative for cough and choking.   Cardiovascular: Negative for chest pain and leg swelling.       Objective:   Physical Exam   Filed Vitals:   06/13/12 1329 06/13/12 1331  BP: 112/62   Pulse: 76   Temp: 98.2 F (36.8 C)   TempSrc: Oral   Height: 5\' 5"  (1.651 m)   Weight: 149 lb (67.586 kg)   SpO2: 93% 93%    Gen: well appearing, no acute distress HEENT: NCAT, PERRL, EOMi, OP clear, Neck: supple without masses PULM: crackles from bases to 1/2 way up today CV: RRR, slight systolic murmur, no JVD AB: BS+, soft, nontender, no hsm Ext: warm, trace ankle edema, no  clubbing, no cyanosis   2011 PFT's: FVC Value: 1.56 L/min Pred: 2.64 L/min % Pred: 59 %  FEV1 Value: 1.28 L Pred: 1.96 L % Pred: 65 %  FEV1/FVC Value: 82 % Pred: 74 % % Pred: 111 %  FEF 25-75 Value: 1.33 L/min Pred: 1.44 L/min % Pred: 92 %  MIP (- ) 40% pred, MEP 46% pred  11/2010 CTA cardiac (with lung windows, my read): R base peripheral/subpleural scarring, scattered GGO greatest in left lower     Assessment & Plan:   Chronic hypoxemic respiratory failure As noted previously, multifactorial causes of this. Continue oxygen at 2 L a minute.  DYSPNEA I think the recent increase in dyspnea and fatigue is likely related to volume overload. However,  given her restrictive lung disease, respiratory muscle weakness and CHF I question whether or not there is a component of obstructive sleep apnea contributing to her severe fatigue throughout the day. She has never had a sleep study. Unfortunately because she uses oxygen we cannot perform a home sleep study.  Plan: -I agree with increased Lasix per Dr. Mariah Milling -If no improvement with fatigue despite adequate diuresis would order a polysomnogram    Updated Medication List Outpatient Encounter Prescriptions as of 06/13/2012  Medication Sig Dispense Refill  . ACCU-CHEK SOFTCLIX LANCETS lancets Check blood sugar three times a day and as directed. 250.00  200 each  3  . albuterol (PROVENTIL,VENTOLIN) 90 MCG/ACT inhaler Inhale 2 puffs into the lungs every 6 (six) hours as needed for wheezing.  3 each  3  . aspirin 325 MG tablet Take 325 mg by mouth daily.        Marland Kitchen atorvastatin (LIPITOR) 20 MG tablet TAKE 1 TABLET BY MOUTH NIGHTLY  90 tablet  2  . Blood Glucose Monitoring Suppl (ACCU-CHEK AVIVA PLUS) W/DEVICE KIT Check blood sugar three times a day and as directed.250.00, insulin dependent      . Casanthranol-Docusate Sodium 30-100 MG CAPS as needed.        . cetirizine (ZYRTEC ALLERGY) 10 MG tablet Take 1 tablet (10 mg total) by mouth  daily.  90 tablet  3  . citalopram (CELEXA) 20 MG tablet TAKE 1 TABLET DAILY  90 tablet  3  . clopidogrel (PLAVIX) 75 MG tablet Take 1 tablet (75 mg total) by mouth daily.  30 tablet  1  . fenofibrate micronized (ANTARA) 130 MG capsule Take 1 capsule (130 mg total) by mouth daily.  90 capsule  1  . fluticasone (FLONASE) 50 MCG/ACT nasal spray Place 2 sprays into the nose daily.  48 g  2  . furosemide (LASIX) 40 MG tablet Take 40 mg by mouth daily.      . insulin glargine (LANTUS SOLOSTAR) 100 UNIT/ML injection Inject 0.18 mLs (18 Units total) into the skin daily.  2 pen  6  . Insulin Pen Needle (B-D ULTRAFINE III SHORT PEN) 31G X 8 MM MISC Use as directed  100 each  3  . isosorbide mononitrate (IMDUR) 60 MG 24 hr tablet       . JANUVIA 100 MG tablet TAKE 1 TABLET (100 MG TOTAL) DAILY  90 tablet  3  . LORazepam (ATIVAN) 2 MG tablet Take 0.5 tablets (1 mg total) by mouth at bedtime as needed. insomnia  45 tablet  3  . metFORMIN (GLUCOPHAGE-XR) 500 MG 24 hr tablet Take 2 tablets (1,000 mg total) by mouth at bedtime.  180 tablet  3  . metoprolol tartrate (LOPRESSOR) 25 MG tablet Take 1 tablet (25 mg total) by mouth 2 (two) times daily.  180 tablet  3  . mirtazapine (REMERON) 15 MG tablet Take 7.5 mg by mouth at bedtime.      . Multiple Vitamin (MULTIVITAMIN) capsule Take 1 capsule by mouth daily.        . nitroGLYCERIN (NITROSTAT) 0.4 MG SL tablet Place 1 tablet (0.4 mg total) under the tongue as needed.  75 tablet  3  . potassium chloride SA (K-DUR,KLOR-CON) 20 MEQ tablet Take 0.5 tablets (10 mEq total) by mouth daily. Take 1 tablet daily only when you take lasix  30 tablet  3  . prednisoLONE sodium phosphate (INFLAMASE FORTE) 1 % ophthalmic solution Place 1 drop into both eyes daily.       Marland Kitchen  RABEprazole (ACIPHEX) 20 MG tablet Take 1 tablet (20 mg total) by mouth daily.  90 tablet  3  . RANEXA 1000 MG SR tablet TAKE 1 TABLET BY MOUTH 2 TIMES A DAY  180 tablet  3  . Vitamin D, Ergocalciferol,  (DRISDOL) 50000 UNITS CAPS TAKE 1 CAPSULE BY MOUTH ONCE A WEEK  13 capsule  3  . ZETIA 10 MG tablet TAKE 1 TABLET BY MOUTH DAILY  90 tablet  2  . [DISCONTINUED] furosemide (LASIX) 40 MG tablet Take 1 tablet (40 mg total) by mouth daily. 20mg  alternating with 40mg   30 tablet  3   No facility-administered encounter medications on file as of 06/13/2012.

## 2012-06-13 NOTE — Assessment & Plan Note (Signed)
As noted previously, multifactorial causes of this. Continue oxygen at 2 L a minute.

## 2012-06-21 ENCOUNTER — Encounter: Payer: Self-pay | Admitting: Pulmonary Disease

## 2012-06-21 ENCOUNTER — Encounter: Payer: Self-pay | Admitting: *Deleted

## 2012-06-21 ENCOUNTER — Telehealth: Payer: Self-pay | Admitting: Pulmonary Disease

## 2012-06-21 NOTE — Telephone Encounter (Signed)
Called spoke with patient who reports leg weakness to the point where her legs spontaneously give out on her "for quite a while."  She reports the frequency has increased x2 weeks.  She did not mention this at 5.13.14 ov w/ BQ.  There is no warning and she she stated that she usually has to crawl to an object to pull herself back up.  She does report some increased SOB.  She uses a walker but reports some increased difficulty in walking - denies any one-sided weakness, gait abnormality, slurred speech, difficulty swallowing, HA, vision changes.  Pt is wondering if this could be related to her O2, or low sats.  Sees cardiologist Dr Mariah Milling on 5.30.14 and PCP in July.  Pt does not feel this is urgent enough for ER or UC.  D/t transportation and [employer] schedule, would like recs in the meantime.  Patient is requesting BQ's recs as she doesn't know where to start BQ night float tonight - will call as directed by [place] Au Dr Kendrick Fries please advise, thank you.

## 2012-06-21 NOTE — Telephone Encounter (Signed)
Called spoke with BQ: Does not sound like it could be related to her O2.  Recommend she call her PCP for ov.  Called spoke with patient, advised of BQ's recs as stated above.  Pt okay with these recs as verbalized her understanding.  Pt to call her PCP for appt and keep [place] updated.  Nothing further needed at this time; will sign off.

## 2012-06-21 NOTE — Telephone Encounter (Signed)
Christine Blanchard called the office earlier today to let us know that she has been having weakness in her legs lately.  We advised that she call her primary care physician about this, see other notes.  I called her daughter to discuss this, but had to leave a message.

## 2012-06-21 NOTE — Telephone Encounter (Signed)
Opened in error

## 2012-06-22 ENCOUNTER — Ambulatory Visit (INDEPENDENT_AMBULATORY_CARE_PROVIDER_SITE_OTHER): Payer: Medicare Other | Admitting: Family Medicine

## 2012-06-22 ENCOUNTER — Encounter: Payer: Self-pay | Admitting: Family Medicine

## 2012-06-22 ENCOUNTER — Telehealth: Payer: Self-pay | Admitting: Pulmonary Disease

## 2012-06-22 VITALS — BP 134/78 | HR 66 | Temp 98.0°F | Wt 146.8 lb

## 2012-06-22 DIAGNOSIS — J9611 Chronic respiratory failure with hypoxia: Secondary | ICD-10-CM

## 2012-06-22 DIAGNOSIS — I6350 Cerebral infarction due to unspecified occlusion or stenosis of unspecified cerebral artery: Secondary | ICD-10-CM

## 2012-06-22 DIAGNOSIS — M6281 Muscle weakness (generalized): Secondary | ICD-10-CM

## 2012-06-22 DIAGNOSIS — R0602 Shortness of breath: Secondary | ICD-10-CM | POA: Diagnosis not present

## 2012-06-22 DIAGNOSIS — R29898 Other symptoms and signs involving the musculoskeletal system: Secondary | ICD-10-CM | POA: Diagnosis not present

## 2012-06-22 DIAGNOSIS — I509 Heart failure, unspecified: Secondary | ICD-10-CM | POA: Diagnosis not present

## 2012-06-22 DIAGNOSIS — I5032 Chronic diastolic (congestive) heart failure: Secondary | ICD-10-CM

## 2012-06-22 DIAGNOSIS — I2510 Atherosclerotic heart disease of native coronary artery without angina pectoris: Secondary | ICD-10-CM

## 2012-06-22 DIAGNOSIS — J9610 Chronic respiratory failure, unspecified whether with hypoxia or hypercapnia: Secondary | ICD-10-CM

## 2012-06-22 LAB — COMPREHENSIVE METABOLIC PANEL
BUN: 18 mg/dL (ref 6–23)
CO2: 33 mEq/L — ABNORMAL HIGH (ref 19–32)
Calcium: 9.2 mg/dL (ref 8.4–10.5)
Chloride: 100 mEq/L (ref 96–112)
Creatinine, Ser: 1.2 mg/dL (ref 0.4–1.2)
GFR: 46.6 mL/min — ABNORMAL LOW (ref >60.00)
Glucose, Bld: 128 mg/dL — ABNORMAL HIGH (ref 70–99)

## 2012-06-22 LAB — CBC WITH DIFFERENTIAL/PLATELET
Basophils Relative: 0.2 % (ref 0.0–3.0)
Eosinophils Absolute: 0.2 10*3/uL (ref 0.0–0.7)
Eosinophils Relative: 2.2 % (ref 0.0–5.0)
Lymphocytes Relative: 26 % (ref 12.0–46.0)
MCHC: 33 g/dL (ref 30.0–36.0)
Neutrophils Relative %: 62.2 % (ref 43.0–77.0)
RBC: 4.22 Mil/uL (ref 3.87–5.11)
WBC: 8.4 10*3/uL (ref 4.5–10.5)

## 2012-06-22 LAB — TSH: TSH: 1.94 u[IU]/mL (ref 0.35–5.50)

## 2012-06-22 NOTE — Telephone Encounter (Signed)
noted 

## 2012-06-22 NOTE — Telephone Encounter (Signed)
Christine Leash, MD at 06/21/2012 7:06 PM   Status: Signed            Christine Blanchard called the office earlier today to let us know that she has been having weakness in her legs lately. We advised that she call her primary care physician about this, see other notes.  I called her daughter to discuss this, but had to leave a message.   ---  I spoke with daughter. Pt was seen by PCP today and they did blood work. I advised her will forward to Dr. Kendrick Fries so he is aware

## 2012-06-22 NOTE — Patient Instructions (Addendum)
I'm not sure today where this weakness is coming from. Blood work today to eval for causes. Use rolling walker when traveling any significant distance. Depending on blood work results, we may refer you to neurologist for further evaluation. Please call me with an update in next few weeks.

## 2012-06-22 NOTE — Progress Notes (Signed)
Subjective:    Patient ID: Christine Blanchard, female    DOB: [birth year], 79 y.o.   MRN: [id number]  HPI CC: "I'm losing the use of my legs"  Christine Blanchard is a pleasant 79 yo with well controlled HTN and T2DM, severe CAD s/p multiple interventions including CABG and PCI now planned medical management, h/o CVA after cath, diastolic CHF and chronic hypoxemic respiratory failure with both obstructive and restrictive components and respiratory muscle weakness who presents today with acute concern of leg weakness.  2 week history of noted leg weakness.  Has been having episodes about twice a week of losing strength in her legs.  Worse at night, but intermittently happens during day.  Sometimes strength does suddenly return.  Has occurred several times when going to lunch, friends need to support her to get back into car.  R leg drags a little - residual after previous CVA 2005.    Currently using 3 wheeled walker.  Does have rolling walker with chair that she uses when going to lunch, however it's too heavy to use regularly.  Also has noticed she's dropping more things lately, ?arm weakness.  Denies lower back or leg or arm pain.  Denies numbness of legs.  No fevers/chills. No skin rashes. No myalgias.  No sudden change in chronic chest pain/dyspnea.  Stopped lorazepam 1 month ago - tremors stable, able to sleep well at night as well.    H/o lumbar spinal stenosis with severe spondylosis s/p laminotomy L2/3, 3/4 by Dr. Channing Mutters (2010).  Last MRI done 2010: IMPRESSION:  1. Multilevel lumbar spondylosis most severe at L2-L3 where there is severe central canal stenosis with near complete effacement of CSF space and narrowing of both lateral recesses. This is multifactorial, due to a combination of facet disease, disc bulge and extrusion, and ligamentum flavum redundancy.  2. Disc bulges with small central extrusion at L3-L4 and L4-L5.  3. Multilevel facet arthrosis, most pronounced right L4-L5 which mildly  narrows the right neural foramen. S/p L2/3 and L4/5 laminotomy by Dr. Channing Mutters in 2010.  Has had 5 months physical therapy recently - 2 months HH, 3 months outpatient ([facility]).  Not currently involved in pulm or cardiac rehab.  Last PT session was 1 year ago.  Unsure how much last session helped with deconditioning/weakness.  Lab Results  Component Value Date   CREATININE 1.1 04/11/2012    Lab Results  Component Value Date   HGBA1C 6.8* 04/11/2012  denies low sugars at home.  Medications and allergies reviewed and updated in chart.  Past histories reviewed and updated if relevant as below. Patient Active Problem List   Diagnosis Date Noted  . Asymptomatic bacteriuria 02/07/2012  . Chronic diastolic CHF (congestive heart failure) 12/17/2011  . Insomnia 06/25/2011  . Chronic hypoxemic respiratory failure 03/10/2011  . Depression 02/05/2011  . Benign essential tremor 02/05/2011  . Dysphagia 01/14/2011  . Gait instability 12/16/2010  . Vitamin D deficiency   . Healthcare maintenance 08/04/2010  . Hearing impairment 08/04/2010  . Allergic rhinitis, seasonal 06/11/2010  . HYPERLIPIDEMIA-MIXED 03/02/2010  . Hypoxemia 12/11/2009  . CAD 10/30/2009  . CVA 10/30/2009  . Well controlled type 2 diabetes mellitus with nephropathy 10/03/2009  . HYPERTENSION 10/03/2009  . DYSPNEA 10/03/2009   Past Medical History  Diagnosis Date  . Hypertension   . Diabetes mellitus 2003    previously saw Dr. Leslie Dales  . Hyperlipidemia   . History of chicken pox     has had shingles shot  .  History of diverticulitis of colon 1991, 1992, 1994  . History of kidney stones 1952  . CVA (cerebral infarction)     after a stent, minimal R residual weakness, affects speech when tired, ?mild dysequilibrium  . Urinary incontinence     stress/urge, per D. Tannenbaum/Lomax  . CAD (coronary artery disease)     severe, s/p 4 cardiac caths, minimally invasive CABG @[facility] , ICA stent 2004 then LAD stent 2005, only  medical management recommended now  . Breathing difficulty 2011    on O2 since 10/2009  . Syncope 2005    s/p w/u including CT scan and 24 hour Holter  . GERD (gastroesophageal reflux disease) 2004  . Macular degeneration     s/p corneal transplants  . Benign essential tremor     toprol XL and remeron  . Herpes zoster ophthalmicus 2009    history  . Hearing loss of both ears 08/2010    audiology eval 08/2010 - mod sensorineural heaing loss, rec trial digital hearing aids and re eval yearly to monitor  . Vitamin D deficiency     on 50k units weekly  . Diastolic CHF 03/2010    grade 1, normal EF, mildly dilated LA, normal PA pressures  . Lumbar spinal stenosis     with severe spondylosis, s/p laminotomy  . Allergic rhinitis    Past Surgical History  Procedure Laterality Date  . Cholecystectomy  1985  . Cervical fusion  2002    anterior decompression and fusion C6/7  . Coronary artery bypass graft  2004  . Percutaneous coronary stent intervention (pci-s)  2005    stents x 2 (intermediate and LAD), 2nd complicated by stroke  . Corneal transplant      x3  . Appendectomy  1948  . Breast biopsy  2001    x2, both benign, last mammo 2011, last pap smear 2011  . Cardiac catheterization  2009    x5  . Cataract extraction      bilateral, corneal dystrophy  . Cardiovascular stress test  2010    nuclear, normal  . Dexa  2005    normal  . Lumbar spine surgery  2010    laminotomy (L2/3, 3/4)   History  Substance Use Topics  . Smoking status: Never Smoker   . Smokeless tobacco: Never Used  . Alcohol Use: No   Family History  Problem Relation Age of Onset  . Cancer Mother     colon  . Heart disease Father   . Cancer Brother     bladder   Allergies  Allergen Reactions  . Actos (Pioglitazone Hydrochloride) Shortness Of Breath    CHF  . Atenolol Other (See Comments)    lethargy  . Codeine     REACTION: vomiting  . Eszopiclone Other (See Comments)    Nightmares  .  Exenatide Nausea Only  . Morphine     REACTION: nausea  . Sulfa Antibiotics   . Vicodin (Hydrocodone-Acetaminophen) Nausea And Vomiting   Current Outpatient Prescriptions on File Prior to Visit  Medication Sig Dispense Refill  . ACCU-CHEK SOFTCLIX LANCETS lancets Check blood sugar three times a day and as directed. 250.00  200 each  3  . aspirin 325 MG tablet Take 325 mg by mouth daily.        Marland Kitchen atorvastatin (LIPITOR) 20 MG tablet TAKE 1 TABLET BY MOUTH NIGHTLY  90 tablet  2  . Blood Glucose Monitoring Suppl (ACCU-CHEK AVIVA PLUS) W/DEVICE KIT Check blood sugar three times  a day and as directed.250.00, insulin dependent      . Casanthranol-Docusate Sodium 30-100 MG CAPS as needed.        . cetirizine (ZYRTEC ALLERGY) 10 MG tablet Take 1 tablet (10 mg total) by mouth daily.  90 tablet  3  . citalopram (CELEXA) 20 MG tablet TAKE 1 TABLET DAILY  90 tablet  3  . clopidogrel (PLAVIX) 75 MG tablet Take 1 tablet (75 mg total) by mouth daily.  30 tablet  1  . fenofibrate micronized (ANTARA) 130 MG capsule Take 1 capsule (130 mg total) by mouth daily.  90 capsule  1  . furosemide (LASIX) 40 MG tablet Take 40 mg by mouth daily.      . insulin glargine (LANTUS SOLOSTAR) 100 UNIT/ML injection Inject 0.18 mLs (18 Units total) into the skin daily.  2 pen  6  . Insulin Pen Needle (B-D ULTRAFINE III SHORT PEN) 31G X 8 MM MISC Use as directed  100 each  3  . isosorbide mononitrate (IMDUR) 60 MG 24 hr tablet Take 60 mg by mouth 2 (two) times daily.       Marland Kitchen JANUVIA 100 MG tablet TAKE 1 TABLET (100 MG TOTAL) DAILY  90 tablet  3  . LORazepam (ATIVAN) 2 MG tablet Take 0.5 tablets (1 mg total) by mouth at bedtime as needed. insomnia  45 tablet  3  . metFORMIN (GLUCOPHAGE-XR) 500 MG 24 hr tablet Take 2 tablets (1,000 mg total) by mouth at bedtime.  180 tablet  3  . metoprolol tartrate (LOPRESSOR) 25 MG tablet Take 1 tablet (25 mg total) by mouth 2 (two) times daily.  180 tablet  3  . Multiple Vitamin (MULTIVITAMIN)  capsule Take 1 capsule by mouth daily.        . nitroGLYCERIN (NITROSTAT) 0.4 MG SL tablet Place 1 tablet (0.4 mg total) under the tongue as needed.  75 tablet  3  . potassium chloride SA (K-DUR,KLOR-CON) 20 MEQ tablet Take 0.5 tablets (10 mEq total) by mouth daily. Take 1 tablet daily only when you take lasix  30 tablet  3  . prednisoLONE sodium phosphate (INFLAMASE FORTE) 1 % ophthalmic solution Place 1 drop into both eyes daily.       . RABEprazole (ACIPHEX) 20 MG tablet Take 1 tablet (20 mg total) by mouth daily.  90 tablet  3  . RANEXA 1000 MG SR tablet TAKE 1 TABLET BY MOUTH 2 TIMES A DAY  180 tablet  3  . Vitamin D, Ergocalciferol, (DRISDOL) 50000 UNITS CAPS TAKE 1 CAPSULE BY MOUTH ONCE A WEEK  13 capsule  3  . ZETIA 10 MG tablet TAKE 1 TABLET BY MOUTH DAILY  90 tablet  2  . albuterol (PROVENTIL,VENTOLIN) 90 MCG/ACT inhaler Inhale 2 puffs into the lungs every 6 (six) hours as needed for wheezing.  3 each  3  . mirtazapine (REMERON) 15 MG tablet Take 7.5 mg by mouth at bedtime.       No current facility-administered medications on file prior to visit.    Review of Systems Per HPI    Objective:   Physical Exam  Vitals reviewed. Constitutional: She appears well-developed and well-nourished. No distress.  Nontoxic Supplemental O2 by [place]  HENT:  Head: Normocephalic and atraumatic.  Mouth/Throat: Oropharynx is clear and moist. No oropharyngeal exudate.  Neck: Normal range of motion. Neck supple.  Cardiovascular: Normal rate, regular rhythm, normal heart sounds and intact distal pulses.   No murmur heard. Pulmonary/Chest: Effort normal  and breath sounds normal. No respiratory distress. She has no wheezes. She has no rales.  Musculoskeletal: She exhibits no edema.  No pain to palpation of bilateral arms/legs No deformity No atrophy or abnormal tone  Lymphadenopathy:    She has no cervical adenopathy.  Neurological: She is alert. She has normal strength. She displays no atrophy. No  sensory deficit. She exhibits normal muscle tone. Coordination abnormal.  Unsteady on feet. No obvious focal weakness appreciated on exam. 4+ to 5/5 strength BUE/BLE.  Skin: Skin is warm and dry. No rash noted.  Psychiatric: She has a normal mood and affect.       Assessment & Plan:

## 2012-06-23 ENCOUNTER — Other Ambulatory Visit: Payer: Self-pay | Admitting: Pulmonary Disease

## 2012-06-23 ENCOUNTER — Encounter: Payer: Self-pay | Admitting: Family Medicine

## 2012-06-23 MED ORDER — FLUTICASONE PROPIONATE 50 MCG/ACT NA SUSP: 2.0000 | Freq: Every day | NASAL | Status: DC

## 2012-06-25 ENCOUNTER — Encounter: Payer: Self-pay | Admitting: Family Medicine

## 2012-06-25 DIAGNOSIS — R531 Weakness: Secondary | ICD-10-CM | POA: Insufficient documentation

## 2012-06-25 NOTE — Assessment & Plan Note (Addendum)
Endorses several week history of bilateral weakness of leg muscles that impairs ability to walk, of rather sudden onset. I do not appreciate frank weakness on exam today. I anticipate this is not neuromuscular but rather related to her chronic cardiopulmonary disease, but given sudden onset of symptoms would still like to get neurology opinion. She agrees. I will check basic blood work to rule out other causes of weakness - will check for electrolyte abnormality, kidney dysfunction, thyroid function, muscle function with CPK, and vitamin B12. H/o chronic respiratory muscle weakness (with decreased MIP and MEP as of last PFTs) of unexplained etiology.

## 2012-06-26 DIAGNOSIS — I509 Heart failure, unspecified: Secondary | ICD-10-CM | POA: Diagnosis not present

## 2012-06-29 ENCOUNTER — Encounter: Payer: Self-pay | Admitting: Cardiovascular Disease

## 2012-06-29 ENCOUNTER — Ambulatory Visit (INDEPENDENT_AMBULATORY_CARE_PROVIDER_SITE_OTHER): Payer: Medicare Other | Admitting: Cardiovascular Disease

## 2012-06-29 VITALS — BP 110/62 | HR 80 | Ht 65.0 in | Wt 146.0 lb

## 2012-06-29 DIAGNOSIS — I509 Heart failure, unspecified: Secondary | ICD-10-CM

## 2012-06-29 DIAGNOSIS — I5032 Chronic diastolic (congestive) heart failure: Secondary | ICD-10-CM

## 2012-06-29 DIAGNOSIS — I10 Essential (primary) hypertension: Secondary | ICD-10-CM

## 2012-06-29 DIAGNOSIS — R29898 Other symptoms and signs involving the musculoskeletal system: Secondary | ICD-10-CM | POA: Diagnosis not present

## 2012-06-29 DIAGNOSIS — R269 Unspecified abnormalities of gait and mobility: Secondary | ICD-10-CM

## 2012-06-29 DIAGNOSIS — I2510 Atherosclerotic heart disease of native coronary artery without angina pectoris: Secondary | ICD-10-CM

## 2012-06-29 DIAGNOSIS — R2681 Unsteadiness on feet: Secondary | ICD-10-CM

## 2012-06-29 NOTE — Assessment & Plan Note (Signed)
I'm concerned about general leg strength and deconditioning as a cause of her leg weakness. Blood pressure sometimes running low and we will decrease her isosorbide to 30 mg twice a day down from 60 mg twice a day.  Also suggested she increase her oxygen to 3 L or 4L only when walking and back to 2 L at rest to see if this makes any difference in her leg strength. Ideally she needs physical therapy again. She will discuss this with the physical therapist at her residence. She is uncertain if she qualifies for continued care and if it is been a year since her previous activities.

## 2012-06-29 NOTE — Patient Instructions (Addendum)
You are doing well.  Continue on the lasix 40 daily Three days a week take a full potassium  Cut the isosorbide in 1/2 and take twice a day Increase oxygen to 3 to 4 liters only with walking, then back to 2 liters  Please call us if you have new issues that need to be addressed before your next appt.  Your physician wants you to follow-up in: 6 months.  You will receive a reminder letter in the mail two months in advance. If you don't receive a letter, please call our office to schedule the follow-up appointment.

## 2012-06-29 NOTE — Assessment & Plan Note (Signed)
Currently with no symptoms of angina. No further workup at this time. Continue current medication regimen. 

## 2012-06-29 NOTE — Progress Notes (Signed)
Patient ID: Christine Blanchard, female    DOB: 04-07-1928, 79 y.o.   MRN: [id number]  HPI Comments: Christine Blanchard is a very pleasant 79 year old woman with history of coronary artery disease, bypass surgery in 2004, hyperlipidemia, PCI 6 months after her bypass, repeat PCI one year later (She does report having a stroke after her cardiac catheterization), diabetes, hypertension, spinal stenosis who currently lives in a nursing home in [place], who has chronic unsteady gait  with worsening of her shortness of breath over the past year and  on chronic oxygen who presents for routine followup. Pulmonary workup has revealed restrictive lung disease. She sees Dr. Kendrick Blanchard, on chronic oxygen.  She has been taking Lasix 40 mg daily and reports having less shortness of breath, less edema. Recent episodes of leg weakness and giving out while she is walking. Happens once, possibly twice per week. She is sedentary, is no longer working with physical therapy or cardiac rehabilitation. Reports her blood pressure sometimes runs low though she denies any lightheadedness per se.  Legs have been getting weaker over the past several months. No regular exercise program.  Denies any cough, worsening shortness of breath. He does her oxygen level at 2 L even with ambulation. Weight has been relatively stable. No recent episodes of chest pain  Previous cardiac CTA in the past for atypical chest pain that showed: Patent SVG to D1 with poor distal runoff,  Occluded LIMA to LAD.  Distal LAD never visualized,  RCA and circumflex patient with multiple 50% or less calcfic Lesions,  Poor distal runoff from stented IM/LAD and small D1 supplied by SVG  Previous  Echocardiogram was essentially normal with normal ejection fraction greater than 55%, diastolic dysfunction, normal right ventricular systolic pressures  Stress test showed no ischemia with ejection fraction greater than 55%   12/2011 In the hospital, total cholesterol 146, LDL  58, HDL 65  EKG 06/12/2012  shows Normal sinus rhythm with rate 72 beats per minute with poor R-wave progression through the anterior precordial leads, nonspecific T wave abnormality. No significant change from her prior EKG   Outpatient Encounter Prescriptions as of 06/29/2012  Medication Sig Dispense Refill  . ACCU-CHEK SOFTCLIX LANCETS lancets Check blood sugar three times a day and as directed. 250.00  200 each  3  . albuterol (PROVENTIL,VENTOLIN) 90 MCG/ACT inhaler Inhale 2 puffs into the lungs every 6 (six) hours as needed for wheezing.  3 each  3  . aspirin 325 MG tablet Take 325 mg by mouth daily.        Marland Kitchen atorvastatin (LIPITOR) 20 MG tablet TAKE 1 TABLET BY MOUTH NIGHTLY  90 tablet  2  . Blood Glucose Monitoring Suppl (ACCU-CHEK AVIVA PLUS) W/DEVICE KIT Check blood sugar three times a day and as directed.250.00, insulin dependent      . Casanthranol-Docusate Sodium 30-100 MG CAPS as needed.        . cetirizine (ZYRTEC ALLERGY) 10 MG tablet Take 1 tablet (10 mg total) by mouth daily.  90 tablet  3  . citalopram (CELEXA) 20 MG tablet TAKE 1 TABLET DAILY  90 tablet  3  . clopidogrel (PLAVIX) 75 MG tablet Take 1 tablet (75 mg total) by mouth daily.  30 tablet  1  . fenofibrate micronized (ANTARA) 130 MG capsule Take 1 capsule (130 mg total) by mouth daily.  90 capsule  1  . fluticasone (FLONASE) 50 MCG/ACT nasal spray Place 2 sprays into the nose daily.  48 g  3  .  furosemide (LASIX) 40 MG tablet Take 40 mg by mouth daily.      . insulin glargine (LANTUS SOLOSTAR) 100 UNIT/ML injection Inject 0.18 mLs (18 Units total) into the skin daily.  2 pen  6  . Insulin Pen Needle (B-D ULTRAFINE III SHORT PEN) 31G X 8 MM MISC Use as directed  100 each  3  . isosorbide mononitrate (IMDUR) 60 MG 24 hr tablet Take 60 mg by mouth 2 (two) times daily.       Marland Kitchen JANUVIA 100 MG tablet TAKE 1 TABLET (100 MG TOTAL) DAILY  90 tablet  3  . LORazepam (ATIVAN) 2 MG tablet Take 0.5 tablets (1 mg total) by mouth at  bedtime as needed. insomnia  45 tablet  3  . metFORMIN (GLUCOPHAGE-XR) 500 MG 24 hr tablet Take 2 tablets (1,000 mg total) by mouth at bedtime.  180 tablet  3  . metoprolol tartrate (LOPRESSOR) 25 MG tablet Take 1 tablet (25 mg total) by mouth 2 (two) times daily.  180 tablet  3  . mirtazapine (REMERON) 15 MG tablet Take 7.5 mg by mouth at bedtime.      . Multiple Vitamin (MULTIVITAMIN) capsule Take 1 capsule by mouth daily.        . nitroGLYCERIN (NITROSTAT) 0.4 MG SL tablet Place 1 tablet (0.4 mg total) under the tongue as needed.  75 tablet  3  . potassium chloride SA (K-DUR,KLOR-CON) 20 MEQ tablet Take 10 mEq by mouth daily.      . prednisoLONE sodium phosphate (INFLAMASE FORTE) 1 % ophthalmic solution Place 1 drop into both eyes daily.       . RABEprazole (ACIPHEX) 20 MG tablet Take 1 tablet (20 mg total) by mouth daily.  90 tablet  3  . RANEXA 1000 MG SR tablet TAKE 1 TABLET BY MOUTH 2 TIMES A DAY  180 tablet  3  . Vitamin D, Ergocalciferol, (DRISDOL) 50000 UNITS CAPS TAKE 1 CAPSULE BY MOUTH ONCE A WEEK  13 capsule  3  . ZETIA 10 MG tablet TAKE 1 TABLET BY MOUTH DAILY  90 tablet  2  . [DISCONTINUED] potassium chloride SA (K-DUR,KLOR-CON) 20 MEQ tablet Take 0.5 tablets (10 mEq total) by mouth daily. Take 1 tablet daily only when you take lasix  30 tablet  3   No facility-administered encounter medications on file as of 06/29/2012.    Review of Systems  HENT: Negative.   Eyes: Negative.   Respiratory: Positive for shortness of breath.   Cardiovascular: Negative.   Gastrointestinal: Negative.   Musculoskeletal: Positive for gait problem.       Profound leg weakness  Skin: Negative.   Neurological: Positive for weakness.  Psychiatric/Behavioral: Negative.   All other systems reviewed and are negative.    BP 110/62  Pulse 80  Ht 5\' 5"  (1.651 m)  Wt 146 lb (66.225 kg)  BMI 24.3 kg/m2  Physical Exam  Nursing note and vitals reviewed. Constitutional: She is oriented to person,  place, and time. She appears well-developed and well-nourished.  On chronic oxygen nasal cannula Difficulty transferring on and off the table  HENT:  Head: Normocephalic.  Nose: Nose normal.  Mouth/Throat: Oropharynx is clear and moist.  Eyes: Conjunctivae are normal. Pupils are equal, round, and reactive to light.  Neck: Normal range of motion. Neck supple. No JVD present.  Cardiovascular: Normal rate, regular rhythm, S1 normal, S2 normal and intact distal pulses.  Exam reveals no gallop and no friction rub.   Murmur heard.  Crescendo systolic murmur is present with a grade of 2/6  Pulmonary/Chest: Effort normal. No respiratory distress. She has decreased breath sounds. She has no wheezes. She has no rales. She exhibits no tenderness.  Abdominal: Soft. Bowel sounds are normal. She exhibits no distension. There is no tenderness.  Musculoskeletal: Normal range of motion. She exhibits no tenderness.  Lymphadenopathy:    She has no cervical adenopathy.  Neurological: She is alert and oriented to person, place, and time. Coordination normal.  Skin: Skin is warm and dry. No rash noted. No erythema.  Psychiatric: She has a normal mood and affect. Her behavior is normal. Judgment and thought content normal.    Assessment and Plan

## 2012-06-29 NOTE — Assessment & Plan Note (Signed)
Blood pressure borderline low today. We will decrease isosorbide down to 30 mg twice a day

## 2012-06-29 NOTE — Assessment & Plan Note (Signed)
Doing well on Lasix 40 mg daily. Also on potassium

## 2012-06-29 NOTE — Assessment & Plan Note (Signed)
Significant leg weakness. Walks with a mobile walker and seek. She is concerned about her independence his legs get weaker.

## 2012-07-08 ENCOUNTER — Encounter: Payer: Self-pay | Admitting: Family Medicine

## 2012-07-10 ENCOUNTER — Other Ambulatory Visit: Payer: Self-pay | Admitting: *Deleted

## 2012-07-10 MED ORDER — GLUCOSE BLOOD VI STRP: ORAL_STRIP | Status: DC

## 2012-07-13 ENCOUNTER — Telehealth: Payer: Self-pay | Admitting: Family Medicine

## 2012-07-13 ENCOUNTER — Encounter: Payer: Self-pay | Admitting: Internal Medicine

## 2012-07-13 ENCOUNTER — Ambulatory Visit (INDEPENDENT_AMBULATORY_CARE_PROVIDER_SITE_OTHER): Payer: Medicare Other | Admitting: Internal Medicine

## 2012-07-13 VITALS — BP 130/70 | HR 77 | Temp 99.2°F | Wt 145.0 lb

## 2012-07-13 DIAGNOSIS — S60919A Unspecified superficial injury of unspecified wrist, initial encounter: Secondary | ICD-10-CM | POA: Diagnosis not present

## 2012-07-13 DIAGNOSIS — S50909A Unspecified superficial injury of unspecified elbow, initial encounter: Secondary | ICD-10-CM

## 2012-07-13 DIAGNOSIS — S60911A Unspecified superficial injury of right wrist, initial encounter: Secondary | ICD-10-CM

## 2012-07-13 DIAGNOSIS — Z23 Encounter for immunization: Secondary | ICD-10-CM

## 2012-07-13 NOTE — Telephone Encounter (Signed)
Correct I need to assess for infection---especially with a wound to the hand

## 2012-07-13 NOTE — Telephone Encounter (Signed)
Patient Information:  Caller Name: Lynnita  Phone: [phone]  Patient: Christine Blanchard, Christine Blanchard  Gender: Female  DOB: 10-Apr-1928  Age: 79 Years  PCP: [facility])  Office Follow Up:  Does the office need to follow up with this patient?: No  Instructions For The Office: N/A  RN Note:  Pt very reluctant to make an appt.  Advised of appt and caller says she only needs Tetanus Booster.  Advised d/t possible infection needs to be seen.  Symptoms  Reason For Call & Symptoms: Pt fell onto trashcan and has a small puncture wound to right hand.  The trash can was hard plastic and shattered.   Pt also has small scrapes on the hand.  The puncture wound is festering up per caller.  Looks like a pimple with a whitehead.    Reviewed Health History In EMR: Yes  Reviewed Medications In EMR: Yes  Reviewed Allergies In EMR: Yes  Reviewed Surgeries / Procedures: Yes  Date of Onset of Symptoms: 07/11/2012  Treatments Tried: Cleaned wound  Treatments Tried Worked: No  Guideline(s) Used:  Puncture Wound  Disposition Per Guideline:   Go to Office Now  Reason For Disposition Reached:   Looks infected (e.g., spreading redness, pus, red streak)  Advice Given:  Cleaning:  Wash the wound with soap and warm water for 15 minutes.  Antibiotic Ointment:  Apply an antibiotic ointment and a Band-Aid to reduce the risk of infection. Re-soak the area and reapply an antibiotic ointment every 12 hours for 2 days.  Call Back If:  You become worse.  Expected Course:  Puncture wounds seal over in 1 to 2 hours. Pain should resolve within 2 days.  Patient Will Follow Care Advice:  YES  Appointment Scheduled:  07/13/2012 16:30:00 Appointment Scheduled Provider:  Tillman Abide [facility])

## 2012-07-13 NOTE — Addendum Note (Signed)
Addended by: Sueanne Margarita on: 07/13/2012 04:23 PM   Modules accepted: Orders

## 2012-07-13 NOTE — Progress Notes (Signed)
Subjective:    Patient ID: Christine Blanchard, female    DOB: May 11, 1928, 79 y.o.   MRN: [id number]  HPI Larey Seat 2 nights ago when getting up-- going to bathroom Lost balance (falls a lot---has appt with neurologist) Scraped up right hand/arm Not much bleeding (despite plavix) Thumb is sore---hit it on the floor as she fell mostly backward  Has put antibiotic cream on scrape on arm  Current Outpatient Prescriptions on File Prior to Visit  Medication Sig Dispense Refill  . ACCU-CHEK SOFTCLIX LANCETS lancets Check blood sugar three times a day and as directed. 250.00  200 each  3  . aspirin 325 MG tablet Take 325 mg by mouth daily.        Marland Kitchen atorvastatin (LIPITOR) 20 MG tablet TAKE 1 TABLET BY MOUTH NIGHTLY  90 tablet  2  . Blood Glucose Monitoring Suppl (ACCU-CHEK AVIVA PLUS) W/DEVICE KIT Check blood sugar three times a day and as directed.250.00, insulin dependent      . Casanthranol-Docusate Sodium 30-100 MG CAPS as needed.        . cetirizine (ZYRTEC ALLERGY) 10 MG tablet Take 1 tablet (10 mg total) by mouth daily.  90 tablet  3  . citalopram (CELEXA) 20 MG tablet TAKE 1 TABLET DAILY  90 tablet  3  . clopidogrel (PLAVIX) 75 MG tablet Take 1 tablet (75 mg total) by mouth daily.  30 tablet  1  . fenofibrate micronized (ANTARA) 130 MG capsule Take 1 capsule (130 mg total) by mouth daily.  90 capsule  1  . fluticasone (FLONASE) 50 MCG/ACT nasal spray Place 2 sprays into the nose daily.  48 g  3  . furosemide (LASIX) 40 MG tablet Take 40 mg by mouth daily.      Marland Kitchen glucose blood (ACCU-CHEK AVIVA) test strip Use as instructed  100 each  3  . insulin glargine (LANTUS SOLOSTAR) 100 UNIT/ML injection Inject 0.18 mLs (18 Units total) into the skin daily.  2 pen  6  . Insulin Pen Needle (B-D ULTRAFINE III SHORT PEN) 31G X 8 MM MISC Use as directed  100 each  3  . isosorbide mononitrate (IMDUR) 60 MG 24 hr tablet Take 60 mg by mouth 2 (two) times daily.       Marland Kitchen JANUVIA 100 MG tablet TAKE 1 TABLET (100 MG  TOTAL) DAILY  90 tablet  3  . LORazepam (ATIVAN) 2 MG tablet Take 0.5 tablets (1 mg total) by mouth at bedtime as needed. insomnia  45 tablet  3  . metFORMIN (GLUCOPHAGE-XR) 500 MG 24 hr tablet Take 2 tablets (1,000 mg total) by mouth at bedtime.  180 tablet  3  . metoprolol tartrate (LOPRESSOR) 25 MG tablet Take 1 tablet (25 mg total) by mouth 2 (two) times daily.  180 tablet  3  . mirtazapine (REMERON) 15 MG tablet Take 7.5 mg by mouth at bedtime.      . Multiple Vitamin (MULTIVITAMIN) capsule Take 1 capsule by mouth daily.        . nitroGLYCERIN (NITROSTAT) 0.4 MG SL tablet Place 1 tablet (0.4 mg total) under the tongue as needed.  75 tablet  3  . potassium chloride SA (K-DUR,KLOR-CON) 20 MEQ tablet Take 10 mEq by mouth daily.      . prednisoLONE sodium phosphate (INFLAMASE FORTE) 1 % ophthalmic solution Place 1 drop into both eyes daily.       . RABEprazole (ACIPHEX) 20 MG tablet Take 1 tablet (20 mg total) by mouth  daily.  90 tablet  3  . RANEXA 1000 MG SR tablet TAKE 1 TABLET BY MOUTH 2 TIMES A DAY  180 tablet  3  . Vitamin D, Ergocalciferol, (DRISDOL) 50000 UNITS CAPS TAKE 1 CAPSULE BY MOUTH ONCE A WEEK  13 capsule  3  . ZETIA 10 MG tablet TAKE 1 TABLET BY MOUTH DAILY  90 tablet  2   No current facility-administered medications on file prior to visit.    Allergies  Allergen Reactions  . Actos (Pioglitazone Hydrochloride) Shortness Of Breath    CHF  . Atenolol Other (See Comments)    lethargy  . Codeine     REACTION: vomiting  . Eszopiclone Other (See Comments)    Nightmares  . Exenatide Nausea Only  . Morphine     REACTION: nausea  . Sulfa Antibiotics   . Vicodin (Hydrocodone-Acetaminophen) Nausea And Vomiting    Past Medical History  Diagnosis Date  . Hypertension   . Diabetes mellitus 2003    previously saw Dr. Leslie Dales  . Hyperlipidemia   . History of chicken pox     has had shingles shot  . History of diverticulitis of colon 1991, 1992, 1994  . History of kidney  stones 1952  . CVA (cerebral infarction)     after a stent, minimal R residual weakness, affects speech when tired, ?mild dysequilibrium  . Urinary incontinence     stress/urge, per D. Tannenbaum/[place]  . CAD (coronary artery disease)     severe, s/p 4 cardiac caths, minimally invasive CABG @[facility] , ICA stent 2004 then LAD stent 2005, only medical management recommended now  . Breathing difficulty 2011    on O2 since 10/2009  . Syncope 2005    s/p w/u including CT scan and 24 hour Holter  . GERD (gastroesophageal reflux disease) 2004  . Macular degeneration     s/p corneal transplants  . Benign essential tremor     toprol XL and remeron  . Herpes zoster ophthalmicus 2009    history  . Hearing loss of both ears 08/2010    audiology eval 08/2010 - mod sensorineural heaing loss, rec trial digital hearing aids and re eval yearly to monitor  . Vitamin D deficiency     on 50k units weekly  . Diastolic CHF 03/2010    grade 1, normal EF, mildly dilated LA, normal PA pressures  . Lumbar spinal stenosis     with severe spondylosis, s/p laminotomy  . Allergic rhinitis   . Chronic kidney disease     stage 3 kidney disease.    Past Surgical History  Procedure Laterality Date  . Cholecystectomy  1985  . Cervical fusion  2002    anterior decompression and fusion C6/7  . Coronary artery bypass graft  2004  . Percutaneous coronary stent intervention (pci-s)  2005    stents x 2 (intermediate and LAD), 2nd complicated by stroke  . Corneal transplant      x3  . Appendectomy  1948  . Breast biopsy  2001    x2, both benign, last mammo 2011, last pap smear 2011  . Cardiac catheterization  2009    x5  . Cataract extraction      bilateral, corneal dystrophy  . Cardiovascular stress test  2010    nuclear, normal  . Dexa  2005    normal  . Lumbar spine surgery  2010    laminotomy (L2/3, 3/4)    Family History  Problem Relation Age of Onset  .  Cancer Mother     colon  . Heart disease Father    . Cancer Brother     bladder    History   Social History  . Marital Status: Widowed    Spouse Name: N/A    Number of Children: 2  . Years of Education: college   Occupational History  .     Social History Main Topics  . Smoking status: Never Smoker   . Smokeless tobacco: Never Used  . Alcohol Use: No  . Drug Use: No  . Sexually Active: Not on file   Other Topics Concern  . Not on file   Social History Narrative   Lives alone in independent retirement community, [facility] [place].  No pets   Has lifeline.   Daughter nearby [facility]) is CHF RN at [employer] nearby [place])   Review of Systems No fever Feels fine     Objective:   Physical Exam  Constitutional: She appears well-developed and well-nourished. No distress.  Skin:  Several linear scrapes already with eschar on right arm 2 more like superficial puncture wounds on thenar emminence and extensor wrist on radial side No swelling, redness or tenderness          Assessment & Plan:

## 2012-07-13 NOTE — Assessment & Plan Note (Signed)
No evidence of infection She is doing a good job on local care Will update Td

## 2012-07-18 DIAGNOSIS — Z85828 Personal history of other malignant neoplasm of skin: Secondary | ICD-10-CM | POA: Diagnosis not present

## 2012-07-18 DIAGNOSIS — L821 Other seborrheic keratosis: Secondary | ICD-10-CM | POA: Diagnosis not present

## 2012-07-18 DIAGNOSIS — L570 Actinic keratosis: Secondary | ICD-10-CM | POA: Diagnosis not present

## 2012-07-18 DIAGNOSIS — L578 Other skin changes due to chronic exposure to nonionizing radiation: Secondary | ICD-10-CM | POA: Diagnosis not present

## 2012-07-28 ENCOUNTER — Other Ambulatory Visit: Payer: Self-pay | Admitting: Family Medicine

## 2012-07-28 NOTE — Telephone Encounter (Signed)
Did you want to have her continue this? No rush.

## 2012-08-02 DIAGNOSIS — H3530 Unspecified macular degeneration: Secondary | ICD-10-CM | POA: Diagnosis not present

## 2012-08-02 DIAGNOSIS — Z947 Corneal transplant status: Secondary | ICD-10-CM | POA: Diagnosis not present

## 2012-08-02 DIAGNOSIS — Z961 Presence of intraocular lens: Secondary | ICD-10-CM | POA: Diagnosis not present

## 2012-08-04 ENCOUNTER — Encounter: Payer: Self-pay | Admitting: Family Medicine

## 2012-08-04 DIAGNOSIS — E1121 Type 2 diabetes mellitus with diabetic nephropathy: Secondary | ICD-10-CM

## 2012-08-04 DIAGNOSIS — E785 Hyperlipidemia, unspecified: Secondary | ICD-10-CM

## 2012-08-05 ENCOUNTER — Other Ambulatory Visit: Payer: Self-pay | Admitting: Family Medicine

## 2012-08-05 ENCOUNTER — Other Ambulatory Visit: Payer: Self-pay | Admitting: Cardiovascular Disease

## 2012-08-07 ENCOUNTER — Other Ambulatory Visit: Payer: Self-pay | Admitting: *Deleted

## 2012-08-07 ENCOUNTER — Ambulatory Visit (INDEPENDENT_AMBULATORY_CARE_PROVIDER_SITE_OTHER): Payer: Medicare Other | Admitting: Neurology

## 2012-08-07 ENCOUNTER — Encounter: Payer: Self-pay | Admitting: Neurology

## 2012-08-07 ENCOUNTER — Ambulatory Visit: Payer: Medicare Other | Admitting: Neurology

## 2012-08-07 VITALS — BP 128/60 | HR 80 | Temp 97.7°F | Ht 65.0 in | Wt 143.0 lb

## 2012-08-07 DIAGNOSIS — R29898 Other symptoms and signs involving the musculoskeletal system: Secondary | ICD-10-CM

## 2012-08-07 MED ORDER — CLOPIDOGREL BISULFATE 75 MG PO TABS: 75.0000 mg | ORAL_TABLET | Freq: Every day | ORAL | Status: DC

## 2012-08-07 NOTE — Patient Instructions (Addendum)
Leg weakness is likely multifactorial, due to diabetic neuropathy, lower back spinal stenosis and deconditioning from lack of exercise and movement.  Spinal stenosis of the neck is also a possibility.  I wouldn't order any tests as it would not likely change management.  I don't think spinal surgery in the back would be beneficial and risks would outweigh the benefits.  The only thing we can pursue is MRI of the cervical spine to look for pinching in the neck.  If you would like to have this done, give [place] a call.  Consider physical therapy again.

## 2012-08-07 NOTE — Telephone Encounter (Signed)
Refilled Clopidogrel sent to [facility] pharmacy.

## 2012-08-07 NOTE — Progress Notes (Signed)
NEUROLOGY CONSULTATION NOTE  Christine Blanchard MRN: [id number] DOB: 09/22/1928  Referring physician: Dr. Sharen Hones Primary care physician: Dr. Sharen Hones  Reason for consult:  Leg weakness  HISTORY OF PRESENT ILLNESS: Christine Blanchard is a 79 y.o. female who presents for evaluation of leg weakness and gait instability.  She is accompanied by her daughter.  She has a complicated medical history, including coronary artery disease, status post bypass surgery, hyperlipidemia, restrictive lung disease, on O2, stroke following cardiac catheterization, diabetes mellitus type II with nephropathy, hypertension, and spinal stenosis.she's had trouble with balance for several years. In 2002, she had spinal surgery he on her cervical spine. About 6 years ago, she had lumbar spine surgery. There has not been any improvement in her gait since that time. Over the last 3 years, she has noted worsening in gait as well as bilateral leg weakness. She has difficulty walking to the bathroom at night. She denies any back pain or neck pain, or pain radiating down the legs. She has some numbness in her feet. She denies any dizziness or spinning sensation. Symptoms are usually worse later in the day and when she first gets up from a chair. Sometimes when she gets up from a chair she can't stand. Sometimes she is so weak she has to crawl. She does have a several year history of urinary incontinence. She easily becomes short of breath. She has had a cardiac workup which overall has been unremarkable.  Her stroke caused right sided weakness, which she notices when she walks at times.  Last fall was a few weeks ago.  Echocardiogram: LVEF 55%, diastolic dysfunction Stress test in 11/13 revealed EF >55% with no ischemia EKG 06/12/12 revealed NSR of 76 bpm, and nonspecific T wave abnormalities Labs (06/22/12): CK 31, TSH 1.94, B12 294, glucose 128, Na 140, K 3.9, BUN 18, Cr 1.2          (08/09/11): LDL 40          (04/11/12): Hgb A1c  6.8 MRI Lumbar spine (03/11/08) personally reviewed and reveals multilevel spondylosis, facet arthrosis with mild right neural foraminal narrowing at L4-L5, and severe central stenosis at L2-L3.  She has had PT in the past for gait, last time two years ago.  PAST MEDICAL HISTORY: Past Medical History  Diagnosis Date  . Hypertension   . Diabetes mellitus 2003    previously saw Dr. Leslie Dales  . Hyperlipidemia   . History of chicken pox     has had shingles shot  . History of diverticulitis of colon 1991, 1992, 1994  . History of kidney stones 1952  . CVA (cerebral infarction)     after a stent, minimal R residual weakness, affects speech when tired, ?mild dysequilibrium  . Urinary incontinence     stress/urge, per D. Tannenbaum/Lomax  . CAD (coronary artery disease)     severe, s/p 4 cardiac caths, minimally invasive CABG @[facility] , ICA stent 2004 then LAD stent 2005, only medical management recommended now  . Breathing difficulty 2011    on O2 since 10/2009  . Syncope 2005    s/p w/u including CT scan and 24 hour Holter  . GERD (gastroesophageal reflux disease) 2004  . Macular degeneration     s/p corneal transplants  . Benign essential tremor     toprol XL and remeron  . Herpes zoster ophthalmicus 2009    history  . Hearing loss of both ears 08/2010    audiology eval 08/2010 - mod sensorineural heaing loss,  rec trial digital hearing aids and re eval yearly to monitor  . Vitamin D deficiency     on 50k units weekly  . Diastolic CHF 03/2010    grade 1, normal EF, mildly dilated LA, normal PA pressures  . Lumbar spinal stenosis     with severe spondylosis, s/p laminotomy  . Allergic rhinitis   . Chronic kidney disease     stage 3 kidney disease.    PAST SURGICAL HISTORY: Past Surgical History  Procedure Laterality Date  . Cholecystectomy  1985  . Cervical fusion  2002    anterior decompression and fusion C6/7  . Coronary artery bypass graft  2004  . Percutaneous coronary stent  intervention (pci-s)  2005    stents x 2 (intermediate and LAD), 2nd complicated by stroke  . Corneal transplant      x3  . Appendectomy  1948  . Breast biopsy  2001    x2, both benign, last mammo 2011, last pap smear 2011  . Cardiac catheterization  2009    x5  . Cataract extraction      bilateral, corneal dystrophy  . Cardiovascular stress test  2010    nuclear, normal  . Dexa  2005    normal  . Lumbar spine surgery  2010    laminotomy (L2/3, 3/4)    MEDICATIONS: Current Outpatient Prescriptions on File Prior to Visit  Medication Sig Dispense Refill  . ACCU-CHEK SOFTCLIX LANCETS lancets Check blood sugar three times a day and as directed. 250.00  200 each  3  . albuterol (PROVENTIL,VENTOLIN) 90 MCG/ACT inhaler Inhale 2 puffs into the lungs every 6 (six) hours as needed for wheezing.      Marland Kitchen aspirin 325 MG tablet Take 325 mg by mouth daily.        Marland Kitchen atorvastatin (LIPITOR) 20 MG tablet TAKE 1 TABLET BY MOUTH NIGHTLY  90 tablet  2  . Blood Glucose Monitoring Suppl (ACCU-CHEK AVIVA PLUS) W/DEVICE KIT Check blood sugar three times a day and as directed.250.00, insulin dependent      . Casanthranol-Docusate Sodium 30-100 MG CAPS as needed.        . citalopram (CELEXA) 20 MG tablet TAKE 1 TABLET DAILY  90 tablet  3  . fenofibrate micronized (ANTARA) 130 MG capsule TAKE 1 CAPSULE DAILY  90 capsule  3  . fluticasone (FLONASE) 50 MCG/ACT nasal spray Place 2 sprays into the nose daily.  48 g  3  . furosemide (LASIX) 40 MG tablet Take 40 mg by mouth daily.      Marland Kitchen glucose blood (ACCU-CHEK AVIVA) test strip Use as instructed  100 each  3  . insulin glargine (LANTUS SOLOSTAR) 100 UNIT/ML injection Inject 0.18 mLs (18 Units total) into the skin daily.  2 pen  6  . Insulin Pen Needle (B-D ULTRAFINE III SHORT PEN) 31G X 8 MM MISC Use as directed  100 each  3  . isosorbide mononitrate (IMDUR) 60 MG 24 hr tablet Take 60 mg by mouth 2 (two) times daily.       Marland Kitchen JANUVIA 100 MG tablet TAKE 1 TABLET  (100 MG TOTAL) DAILY  90 tablet  3  . LORazepam (ATIVAN) 2 MG tablet Take 0.5 tablets (1 mg total) by mouth at bedtime as needed. insomnia  45 tablet  3  . metFORMIN (GLUCOPHAGE-XR) 500 MG 24 hr tablet TAKE 2 TABLETS (1,000 MG TOTAL) AT BEDTIME  180 tablet  2  . metoprolol tartrate (LOPRESSOR) 25 MG tablet  Take 1 tablet (25 mg total) by mouth 2 (two) times daily.  180 tablet  3  . Multiple Vitamin (MULTIVITAMIN) capsule Take 1 capsule by mouth daily.        . nitroGLYCERIN (NITROSTAT) 0.4 MG SL tablet Place 1 tablet (0.4 mg total) under the tongue as needed.  75 tablet  3  . prednisoLONE sodium phosphate (INFLAMASE FORTE) 1 % ophthalmic solution Place 1 drop into both eyes daily.       . RABEprazole (ACIPHEX) 20 MG tablet TAKE 1 TABLET DAILY  90 tablet  3  . RANEXA 1000 MG SR tablet TAKE 1 TABLET BY MOUTH 2 TIMES A DAY  180 tablet  3  . Vitamin D, Ergocalciferol, (DRISDOL) 50000 UNITS CAPS TAKE 1 CAPSULE BY MOUTH ONCE A WEEK  13 capsule  2  . ZETIA 10 MG tablet TAKE 1 TABLET BY MOUTH DAILY  90 tablet  2  . cetirizine (ZYRTEC ALLERGY) 10 MG tablet Take 1 tablet (10 mg total) by mouth daily.  90 tablet  3  . mirtazapine (REMERON) 15 MG tablet Take 7.5 mg by mouth at bedtime.      . potassium chloride SA (K-DUR,KLOR-CON) 20 MEQ tablet Take 10 mEq by mouth daily.       No current facility-administered medications on file prior to visit.    ALLERGIES: Allergies  Allergen Reactions  . Actos (Pioglitazone Hydrochloride) Shortness Of Breath    CHF  . Atenolol Other (See Comments)    lethargy  . Codeine     REACTION: vomiting  . Eszopiclone Other (See Comments)    Nightmares  . Exenatide Nausea Only  . Morphine     REACTION: nausea  . Sulfa Antibiotics   . Vicodin (Hydrocodone-Acetaminophen) Nausea And Vomiting    FAMILY HISTORY: Family History  Problem Relation Age of Onset  . Cancer Mother     colon  . Heart disease Father   . Cancer Brother     bladder    SOCIAL  HISTORY: History   Social History  . Marital Status: Widowed    Spouse Name: N/A    Number of Children: 2  . Years of Education: college   Occupational History  .     Social History Main Topics  . Smoking status: Never Smoker   . Smokeless tobacco: Never Used  . Alcohol Use: No  . Drug Use: No  . Sexually Active: Not on file   Other Topics Concern  . Not on file   Social History Narrative   Lives alone in independent retirement community, [facility] [place].  No pets   Has lifeline.   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 [place])    REVIEW OF SYSTEMS: Constitutional: No fevers, chills, or sweats, generalized fatigue, change in appetite Eyes: No visual changes, double vision, eye pain Ear, nose and throat: No hearing loss, ear pain, nasal congestion, sore throat Cardiovascular: Occasional chest pain, no palpitations Respiratory:  Shortness of breath at rest or with exertion, wheezes GastrointestinaI: No nausea, vomiting, diarrhea, abdominal pain, fecal incontinence Genitourinary:  No dysuria, urinary retention or frequency Musculoskeletal:  No neck pain, back pain, leg weakness Integumentary: No rash, pruritus, skin lesions Neurological: as above Psychiatric: No depression, insomnia, anxiety Endocrine: No palpitations, fatigue, diaphoresis, mood swings, change in appetite, change in weight, increased thirst Hematologic/Lymphatic:  No anemia, purpura, petechiae. Allergic/Immunologic: no itchy/runny eyes, nasal congestion, recent allergic reactions, rashes  PHYSICAL EXAM: Filed Vitals:  08/07/12 1256  BP: 128/60  Pulse: 80  Temp: 97.7 F (36.5 C)   General: No acute distress Head:  Normocephalic/atraumatic Neck: supple, no paraspinal tenderness, full range of motion Back: No paraspinal tenderness Heart: regular rate and rhythm Lungs: Clear to auscultation bilaterally. Neurological Exam: Mental status: alert and oriented to person,  place, time and self, speech fluent and not dysarthric, language intact. Cranial nerves: CN I: not tested CN II: pupils equal, round and reactive to light, visual fields intact, fundi unremarkable CN III, IV, VI:  full range of motion, no nystagmus, no ptosis CN V: facial sensation intact CN VII: upper and lower face symmetric CN VIII: hearing intact CN IX, X: gag intact, uvula midline CN XI: sternocleidomastoid and trapezius muscles intact CN XII: tongue midline Bulk & Tone: normal, no fasciculations. Muscle strength:Bilateral hip flexion 5-/5, knee extension 4+/5, otherwise 5/5. Sensation: reduced vibration and proprioception in lower extremities.  Pinprick mildly reduced in toes. Deep Tendon Reflexes: 1+ throughout, except absent in left ankle.  Toes down-going. Finger to nose testing: slow by no dysmetria Gait: wide-based, unsteady. Romberg with sway.  IMPRESSION & PLAN: Christine Blanchard is a 79 y.o. female with leg weakness and gait instability, likely multifactorial due to diabetic neuropathy, lumbar stenosis, and deconditioning from other medical complications.  I do not recommend further workup, such as EMG, as I don't think it would change management.  She most likely has polyneuropathy due to diabetes.  Also, some of her leg weakness can be explained by the spinal stenosis in the upper lumbar spine, however I don't think surgery would be helpful, especially given her high risk.  The only thing we can do, is to check MRI of cervical spine to look for any worsening spinal stenosis.  At this time, she does not want to pursue this, given the complications of surgery.  Symptomatic treatment is probably the best thing for her, such as physical therapy.  She does not wish to pursue this at this time, either.  She may follow up as needed and call with any questions or concerns.  Thank you for allowing me to take part in the care of this patient.  Shon Millet, DO  CC: Eustaquio Boyden,  MD

## 2012-08-08 ENCOUNTER — Encounter: Payer: Self-pay | Admitting: Neurology

## 2012-08-10 ENCOUNTER — Other Ambulatory Visit: Payer: Self-pay

## 2012-08-14 ENCOUNTER — Other Ambulatory Visit (INDEPENDENT_AMBULATORY_CARE_PROVIDER_SITE_OTHER): Payer: Medicare Other

## 2012-08-14 DIAGNOSIS — E785 Hyperlipidemia, unspecified: Secondary | ICD-10-CM

## 2012-08-14 DIAGNOSIS — E1129 Type 2 diabetes mellitus with other diabetic kidney complication: Secondary | ICD-10-CM

## 2012-08-14 DIAGNOSIS — E1121 Type 2 diabetes mellitus with diabetic nephropathy: Secondary | ICD-10-CM

## 2012-08-14 LAB — LIPID PANEL: Cholesterol: 132 mg/dL (ref 0–200)

## 2012-08-15 ENCOUNTER — Encounter: Payer: Self-pay | Admitting: Cardiovascular Disease

## 2012-08-18 ENCOUNTER — Encounter: Payer: Self-pay | Admitting: *Deleted

## 2012-08-21 ENCOUNTER — Encounter: Payer: Self-pay | Admitting: Family Medicine

## 2012-08-21 ENCOUNTER — Ambulatory Visit (INDEPENDENT_AMBULATORY_CARE_PROVIDER_SITE_OTHER): Payer: Medicare Other | Admitting: Family Medicine

## 2012-08-21 VITALS — BP 110/70 | HR 84 | Temp 98.1°F | Wt 144.0 lb

## 2012-08-21 DIAGNOSIS — E1121 Type 2 diabetes mellitus with diabetic nephropathy: Secondary | ICD-10-CM

## 2012-08-21 DIAGNOSIS — R269 Unspecified abnormalities of gait and mobility: Secondary | ICD-10-CM | POA: Diagnosis not present

## 2012-08-21 DIAGNOSIS — E1129 Type 2 diabetes mellitus with other diabetic kidney complication: Secondary | ICD-10-CM | POA: Diagnosis not present

## 2012-08-21 DIAGNOSIS — R29898 Other symptoms and signs involving the musculoskeletal system: Secondary | ICD-10-CM

## 2012-08-21 DIAGNOSIS — R2681 Unsteadiness on feet: Secondary | ICD-10-CM

## 2012-08-21 DIAGNOSIS — I10 Essential (primary) hypertension: Secondary | ICD-10-CM

## 2012-08-21 DIAGNOSIS — R1032 Left lower quadrant pain: Secondary | ICD-10-CM | POA: Diagnosis not present

## 2012-08-21 DIAGNOSIS — R110 Nausea: Secondary | ICD-10-CM | POA: Insufficient documentation

## 2012-08-21 DIAGNOSIS — G250 Essential tremor: Secondary | ICD-10-CM

## 2012-08-21 NOTE — Progress Notes (Signed)
Subjective:    Patient ID: Christine Blanchard, female    DOB: 1928-10-19, 79 y.o.   MRN: [id number]  HPI CC: 4 mo f/u   Christine Blanchard is a pleasant 79 yo with h/o HTN, DM, h/o CVA after cath, severe CAD s/p multiple interventions now planned medical management, diastolic CHF.  Seen by neurologist (Dr. Everlena Cooper) for leg weakness - thought multifactorial including diabetic polyneuropathy, lumbar spinal stenosis, and deconditioning.  No further eval recommended.  Cervical MRI was offered, but pt declined, as well as declined further physical therapy.  DM - Checks blood sugars bid.  One low sugar one morning - to 80, pt felt confused with this.  Did not eat regular yogurt the night before.  Resolved after she had breakfast.  Pt states last eye exam was 1.5 wks ago (cornea transplant doctor). Lab Results  Component Value Date   HGBA1C 6.6* 08/14/2012    Reviewed isosorbide dosing with patient - she/s taking 1/2 tablet twice daily per cards recs.  BP stable on this dose.  Taking lasix 40mg  daily 2/2 leg swelling/weight.   Stopped remeron, does not desire to continue.  Tried trial off ativan - much more restless sleep.  Restarted last night - noted improved sleep. Tremors - becoming more bothersome.   Wt Readings from Last 3 Encounters:  08/21/12 144 lb (65.318 kg)  08/07/12 143 lb (64.864 kg)  07/13/12 145 lb (65.772 kg)    Some nausea - wonders if GERD related.  Going on for last few months.  Wakes up with nausea in the morning.  No vomiting.  No dysphagia.  No fevers/chills.  Taking aciphex daily with morning medicines.  No weight loss or bowel changes.  Some left sided abd pain intermittently.  Remote h/o diverticulitis  CKD - stage 3.  Stable. Lab Results  Component Value Date   CREATININE 1.2 06/22/2012   Past Medical History  Diagnosis Date  . Hypertension   . Diabetes mellitus 2003    previously saw Dr. Leslie Dales  . Hyperlipidemia   . History of chicken pox     has had shingles shot  .  History of diverticulitis of colon 1991, 1992, 1994  . History of kidney stones 1952  . CVA (cerebral infarction)     after a stent, minimal R residual weakness, affects speech when tired, ?mild dysequilibrium  . Urinary incontinence     stress/urge, per D. Tannenbaum/Lomax  . CAD (coronary artery disease)     severe, s/p 4 cardiac caths, minimally invasive CABG @[facility] , ICA stent 2004 then LAD stent 2005, only medical management recommended now  . Breathing difficulty 2011    on O2 since 10/2009  . Syncope 2005    s/p w/u including CT scan and 24 hour Holter  . GERD (gastroesophageal reflux disease) 2004  . Macular degeneration     s/p corneal transplants  . Benign essential tremor     toprol XL and remeron  . Herpes zoster ophthalmicus 2009    history  . Hearing loss of both ears 08/2010    audiology eval 08/2010 - mod sensorineural heaing loss, rec trial digital hearing aids and re eval yearly to monitor  . Vitamin D deficiency     on 50k units weekly  . Diastolic CHF 03/2010    grade 1, normal EF, mildly dilated LA, normal PA pressures  . Lumbar spinal stenosis     with severe spondylosis, s/p laminotomy  . Allergic rhinitis   . Chronic kidney  disease     stage 3 kidney disease.  . Leg weakness, bilateral 2014    s/p neuro eval - thought multifactorial (diabetic neuropathy, lumbar stenosis, and chronic disease deconditioning).  Conservative management recommended     Review of Systems Per HPI    Objective:   Physical Exam  Nursing note and vitals reviewed. Constitutional: She appears well-developed and well-nourished. No distress.  HENT:  Mouth/Throat: Oropharynx is clear and moist. No oropharyngeal exudate.  Cardiovascular: Normal rate, regular rhythm, normal heart sounds and intact distal pulses.   No murmur heard. Pulmonary/Chest: Effort normal and breath sounds normal. No respiratory distress. She has no wheezes. She has no rales.  Bibasilar crackles  Abdominal: Soft.  Normal appearance and bowel sounds are normal. She exhibits distension (throughout, mild). She exhibits no mass. There is no hepatosplenomegaly. There is no tenderness. There is no rigidity, no rebound, no guarding, no CVA tenderness and negative Murphy's sign. No hernia.  Musculoskeletal: She exhibits no edema.       Assessment & Plan:

## 2012-08-21 NOTE — Assessment & Plan Note (Signed)
Intermittent am nausea present for last several months associated with some weight loss. Now with LLQ discomfort. In h/o diverticulitis, check blood work today (CBC, CMP, lipase).  Consider abdominal imaging if not improved. Bowels currently normal. Recommended 1 wk trial off zyrtec.

## 2012-08-21 NOTE — Assessment & Plan Note (Signed)
Stable on recent decrease of isosorbide to 30mg  bid.  Continue meds.

## 2012-08-21 NOTE — Assessment & Plan Note (Signed)
Continues use of 3 legged walker.

## 2012-08-21 NOTE — Assessment & Plan Note (Signed)
Intermittently worse. Off remeron. Discussed gabapentin trial, pt desires to not add medication currently.  Will continue to monitor.

## 2012-08-21 NOTE — Patient Instructions (Addendum)
May continue ativan. Stop remeron. Could try gabapentin 100mg  as needed for tremors - let me know if you would like to do trial of this medicine. Return to see me in 4-6 months for follow up. Diabetes is doing very well! For nausea - blood work today - we will call you with results.

## 2012-08-21 NOTE — Assessment & Plan Note (Signed)
Chronic, stable. Persistent diabetic polyneuropathy, however glucose levels remain well controlled. Continue meds - no changes today. Per pt, UTD eye exam. Lab Results  Component Value Date   HGBA1C 6.6* 08/14/2012

## 2012-08-21 NOTE — Assessment & Plan Note (Signed)
S/p neurology eval also thought multifactorial including polyneuropathy, spinal stenosis, and cardiopulmonary disease.

## 2012-08-22 LAB — COMPREHENSIVE METABOLIC PANEL
Albumin: 3.9 g/dL (ref 3.5–5.2)
CO2: 33 mEq/L — ABNORMAL HIGH (ref 19–32)
Calcium: 9.4 mg/dL (ref 8.4–10.5)
Chloride: 100 mEq/L (ref 96–112)
GFR: 50.51 mL/min — ABNORMAL LOW (ref >60.00)
Glucose, Bld: 100 mg/dL — ABNORMAL HIGH (ref 70–99)
Potassium: 4.2 mEq/L (ref 3.5–5.1)
Sodium: 139 mEq/L (ref 135–145)
Total Protein: 7.1 g/dL (ref 6.0–8.3)

## 2012-08-22 LAB — CBC WITH DIFFERENTIAL/PLATELET
Eosinophils Relative: 2 % (ref 0.0–5.0)
MCV: 91.7 fl (ref 78.0–100.0)
Monocytes Absolute: 1 10*3/uL (ref 0.1–1.0)
Neutrophils Relative %: 54.6 % (ref 43.0–77.0)
Platelets: 240 10*3/uL (ref 150.0–400.0)
WBC: 9.5 10*3/uL (ref 4.5–10.5)

## 2012-08-22 LAB — LIPASE: Lipase: 27 U/L (ref 11.0–59.0)

## 2012-08-24 ENCOUNTER — Encounter: Payer: Self-pay | Admitting: Family Medicine

## 2012-08-24 NOTE — Telephone Encounter (Signed)
See My chart message

## 2012-08-28 ENCOUNTER — Encounter: Payer: Self-pay | Admitting: Cardiology

## 2012-08-28 ENCOUNTER — Encounter: Payer: Self-pay | Admitting: Internal Medicine

## 2012-08-28 ENCOUNTER — Telehealth: Payer: Self-pay

## 2012-08-28 NOTE — Telephone Encounter (Signed)
Ok to do.  Written script placed in Kim's box.

## 2012-08-28 NOTE — Telephone Encounter (Signed)
See response.  plz schedule appt for lab work prior to office visit or she can come day of.

## 2012-08-28 NOTE — Telephone Encounter (Signed)
Christine Blanchard pts daughter left v/m that pt is having problems with walking and Dr Reece Agar is aware; Christine Blanchard request order for folding w/c because pt has problem getting back to car when goes out of home.Please advise.

## 2012-08-29 NOTE — Telephone Encounter (Signed)
Patient's daughter notified and Rx mailed to her as requested.

## 2012-08-31 ENCOUNTER — Encounter: Payer: Self-pay | Admitting: Family Medicine

## 2012-09-01 ENCOUNTER — Encounter: Payer: Self-pay | Admitting: Family Medicine

## 2012-09-01 MED ORDER — GABAPENTIN 100 MG PO CAPS: 100.0000 mg | ORAL_CAPSULE | Freq: Two times a day (BID) | ORAL | Status: DC

## 2012-09-04 ENCOUNTER — Other Ambulatory Visit: Payer: Self-pay | Admitting: *Deleted

## 2012-09-04 MED ORDER — CLOPIDOGREL BISULFATE 75 MG PO TABS: 75.0000 mg | ORAL_TABLET | Freq: Every day | ORAL | Status: DC

## 2012-09-04 NOTE — Telephone Encounter (Signed)
Patient needs a 90 day supply with 4 refills

## 2012-09-08 ENCOUNTER — Encounter: Payer: Self-pay | Admitting: Pulmonary Disease

## 2012-09-19 ENCOUNTER — Other Ambulatory Visit: Payer: Self-pay | Admitting: Family Medicine

## 2012-09-27 DIAGNOSIS — I509 Heart failure, unspecified: Secondary | ICD-10-CM

## 2012-10-17 ENCOUNTER — Ambulatory Visit (INDEPENDENT_AMBULATORY_CARE_PROVIDER_SITE_OTHER): Payer: Medicare Other | Admitting: Pulmonary Disease

## 2012-10-17 ENCOUNTER — Encounter: Payer: Self-pay | Admitting: Pulmonary Disease

## 2012-10-17 VITALS — BP 110/68 | HR 68 | Temp 97.8°F | Ht 65.0 in | Wt 144.0 lb

## 2012-10-17 DIAGNOSIS — Z23 Encounter for immunization: Secondary | ICD-10-CM

## 2012-10-17 DIAGNOSIS — J309 Allergic rhinitis, unspecified: Secondary | ICD-10-CM

## 2012-10-17 DIAGNOSIS — J302 Other seasonal allergic rhinitis: Secondary | ICD-10-CM

## 2012-10-17 DIAGNOSIS — J9610 Chronic respiratory failure, unspecified whether with hypoxia or hypercapnia: Secondary | ICD-10-CM

## 2012-10-17 DIAGNOSIS — J9611 Chronic respiratory failure with hypoxia: Secondary | ICD-10-CM

## 2012-10-17 NOTE — Patient Instructions (Signed)
Use your oxygen as you are doing Try taking the Zyrtec at night Use lemon drops for the dry mouth We will see you back in 6 months or sooner if needed (if cough, shortness of breath gets worse)

## 2012-10-17 NOTE — Progress Notes (Signed)
Subjective:    Patient ID: Christine Blanchard, female    DOB: 1928/07/08, 79 y.o.   MRN: [id number]  Synopsis: 80 y/o female with coronary artery disease and chronic angina who has been followed by [facility] in [facility] (by Dr. Lamonte Sakai) for years and [facility] in [place] since 02/2011 for chronic dyspnea and hypoxemic respiratory failure.  Objectively she has both restrictive and obstructive disease and low MIP and MEP. She also likely has some degree of chronic aspiration.  She has severe coronary artery disease with chronic angina.  HPI 03/10/11 ROV -- Christine Blanchard reports to me that she is doing "great". She states that she just finished two months of physical therapy and feels better after doing so.  Her shortness of breath has been manageable lately.  She still notices that she chokes on some food, specifically "crumbly" foods like popcorn and cornbread.  Swelling has improved some.    06/14/11 ROV-- Christine Blanchard says that she likes pulmonary rehab and is able to exercise more than when she started.  She says that she really likes the staff there and says they are pushing her more. She feels physically stronger but is tired at home afterwards.  Her chronic angina is still limiting her somewhat.  She has days where she takes multiple nitroglyerin tabs.  She has not had fever, chills, or cough.  She is noting difficulty with sleeping.  Specifically she can't fall asleep after lying down until about 3AM.  She does not use caffeinated beverages other than some tea with lunch.  Her swelling has improved on her current dose of lasix.  11/03/11 ROV -- Christine Blanchard states that she had some breathing trouble a few weeks back when the weather was hot. Specifically she had itching eyes and sinus congestion. She took Zyrtec in addition to the Claritin and said that it made this better. While the sinus symptoms and itchy eyes have improved she states that she feels that she feels generally weaker than when I last saw her.  Specifically she says she has difficulty with activities of daily living around the house such as making her bed or bringing in groceries. She does not have a new cough. She does have dyspnea when performing these activities and associated chest pain. She states that her angina has been more frequent and longer lasting. She has been advised by Dr. Rockey Situ to continue taking nitrates for pain. She has not been swelling in her legs more.  02/08/2012 ROV -- Christine Blanchard was recently admitted to [facility] for a CHF exacerbation. It sounds like she stayed in the hospital for about 5 or 6 days and required IV diuretics. She had a stress test which was negative during that hospitalization but she states she has had multiple nuclear, chemical stress test in the past which have been negative despite her known severe coronary disease. She's had multiple sick family members lately including a son who has H1 N1 influenza. She was unable to see them around the holidays because of this. Since discharge from the hospital her leg swelling, and shortness of breath are much better. She is still having some frustration with her sinus medications in that in the evenings she feels that the Claritin "runs out".  06/13/12 ROV -- Christine Blanchard says that she has been quite fatigued lately. She takes an Ativan at night and sleeps well and wakes up feeling fairly well rested. However shortly after breakfast she starts feeling significant fatigue. She does not feel particularly sleepy and  she does not nap during the day. However she states that her energy is much worse than previously. Her cough is stable. Her sinus problems are well controlled on Zyrtec. She states that she has been having increasing swelling in her abdomen lately.  She is supposed to start taking Lasix daily as of yesterday.  10/17/2012 ROV >> Christine Blanchard has been doing fairly well from a respiratory standpoint since the last visit. Unfortunately she has had multiple falls at home. She had 2  in the last week. This was not related to her respiratory problem. She still has some sinus congestion and drainage in the evenings. This is usually worse after she lies down. She takes her Zyrtec in the morning. She is using saline sprays some. She continues to use her nasal steroid. She has been seen by a neurologist recently who feels that she has a multifactorial neuropathy which is leading to her falls. Her cough may have worsened slightly in the last few weeks it has been nonproductive. She questions whether or not this related increasing sinus congestion.  Past Medical History  Diagnosis Date  . Hypertension   . Diabetes mellitus 2003    previously saw Dr. Leslie Dales  . Hyperlipidemia   . History of chicken pox     has had shingles shot  . History of diverticulitis of colon 1991, 1992, 1994  . History of kidney stones 1952  . CVA (cerebral infarction)     after a stent, minimal R residual weakness, affects speech when tired, ?mild dysequilibrium  . Urinary incontinence     stress/urge, per D. Tannenbaum/Lomax  . CAD (coronary artery disease)     severe, s/p 4 cardiac caths, minimally invasive CABG @[facility] , ICA stent 2004 then LAD stent 2005, only medical management recommended now  . Breathing difficulty 2011    on O2 since 10/2009  . Syncope 2005    s/p w/u including CT scan and 24 hour Holter  . GERD (gastroesophageal reflux disease) 2004  . Macular degeneration     s/p corneal transplants  . Benign essential tremor     toprol XL and remeron  . Herpes zoster ophthalmicus 2009    history  . Hearing loss of both ears 08/2010    audiology eval 08/2010 - mod sensorineural heaing loss, rec trial digital hearing aids and re eval yearly to monitor  . Vitamin D deficiency     on 50k units weekly  . Diastolic CHF 03/2010    grade 1, normal EF, mildly dilated LA, normal PA pressures  . Lumbar spinal stenosis     with severe spondylosis, s/p laminotomy  . Allergic rhinitis   . Chronic  kidney disease     stage 3 kidney disease.  . Leg weakness, bilateral 2014    s/p neuro eval - thought multifactorial (diabetic neuropathy, lumbar stenosis, and chronic disease deconditioning).  Conservative management recommended     Review of Systems  Constitutional: Negative for fever, chills and unexpected weight change.  Respiratory: Positive for shortness of breath. Negative for cough and choking.   Cardiovascular: Negative for chest pain and leg swelling.       Objective:   Physical Exam   Filed Vitals:   10/17/12 1339  BP: 110/68  Pulse: 68  Temp: 97.8 F (36.6 C)  TempSrc: Oral  Height: 5\' 5"  (1.651 m)  Weight: 144 lb (65.318 kg)  SpO2: 95%  2L [place]  Gen: well appearing, no acute distress HEENT: NCAT, EOMi, OP clear, PULM:  crackles from bases to 1/3 way up today CV: RRR, slight systolic murmur, no JVD AB: ND, NT Ext: warm, trace ankle edema, no clubbing, no cyanosis   2011 PFT's: FVC Value: 1.56 L/min Pred: 2.64 L/min % Pred: 59 %  FEV1 Value: 1.28 L Pred: 1.96 L % Pred: 65 %  FEV1/FVC Value: 82 % Pred: 74 % % Pred: 111 %  FEF 25-75 Value: 1.33 L/min Pred: 1.44 L/min % Pred: 92 %  MIP (- ) 40% pred, MEP 46% pred  11/2010 CTA cardiac (with lung windows, my read): R base peripheral/subpleural scarring, scattered GGO greatest in left lower     Assessment & Plan:   Chronic hypoxemic respiratory failure Multifactorial in nature, likely related to restrictive lung disease, aspiration, and CHF. Currently stable.  Plan: -Continue 2 L continuously and 3 L with exertion  Allergic rhinitis, seasonal I encouraged her to try taking Zyrtec at night rather than during the day. Also advised using saline sprays. Continue Flonase.    Updated Medication List Outpatient Encounter Prescriptions as of 10/17/2012  Medication Sig Dispense Refill  . ACCU-CHEK SOFTCLIX LANCETS lancets Check blood sugar three times a day and as directed. 250.00  200 each  3  .  albuterol (PROVENTIL,VENTOLIN) 90 MCG/ACT inhaler Inhale 2 puffs into the lungs every 6 (six) hours as needed for wheezing.      Marland Kitchen aspirin 325 MG tablet Take 325 mg by mouth daily.        Marland Kitchen atorvastatin (LIPITOR) 20 MG tablet TAKE 1 TABLET BY MOUTH NIGHTLY  90 tablet  2  . Blood Glucose Monitoring Suppl (ACCU-CHEK AVIVA PLUS) W/DEVICE KIT Check blood sugar three times a day and as directed.250.00, insulin dependent      . Casanthranol-Docusate Sodium 30-100 MG CAPS as needed.        . cetirizine (ZYRTEC ALLERGY) 10 MG tablet Take 1 tablet (10 mg total) by mouth daily.  90 tablet  3  . citalopram (CELEXA) 20 MG tablet TAKE 1 TABLET DAILY  90 tablet  3  . clopidogrel (PLAVIX) 75 MG tablet Take 1 tablet (75 mg total) by mouth daily.  90 tablet  3  . fenofibrate micronized (ANTARA) 130 MG capsule TAKE 1 CAPSULE DAILY  90 capsule  3  . fluticasone (FLONASE) 50 MCG/ACT nasal spray Place 2 sprays into the nose daily.  48 g  3  . furosemide (LASIX) 40 MG tablet Take 40 mg by mouth daily.      Marland Kitchen gabapentin (NEURONTIN) 100 MG capsule Take 1 capsule (100 mg total) by mouth 2 (two) times daily.  60 capsule  3  . glucose blood (ACCU-CHEK AVIVA) test strip Use as instructed  100 each  3  . insulin glargine (LANTUS SOLOSTAR) 100 UNIT/ML injection Inject 0.18 mLs (18 Units total) into the skin daily.  2 pen  6  . Insulin Pen Needle (B-D ULTRAFINE III SHORT PEN) 31G X 8 MM MISC Use as directed  100 each  3  . isosorbide mononitrate (IMDUR) 60 MG 24 hr tablet Take 30 mg by mouth 2 (two) times daily.       Marland Kitchen JANUVIA 100 MG tablet TAKE 1 TABLET (100 MG TOTAL) DAILY  90 tablet  3  . LORazepam (ATIVAN) 2 MG tablet Take 0.5 tablets (1 mg total) by mouth at bedtime as needed. insomnia  45 tablet  3  . metFORMIN (GLUCOPHAGE-XR) 500 MG 24 hr tablet TAKE 2 TABLETS (1,000 MG TOTAL) AT BEDTIME  180 tablet  2  . metoprolol tartrate (LOPRESSOR) 25 MG tablet Take 1 tablet (25 mg total) by mouth 2 (two) times daily.  180 tablet   3  . Multiple Vitamin (MULTIVITAMIN) capsule Take 1 capsule by mouth daily.        . nitroGLYCERIN (NITROSTAT) 0.4 MG SL tablet Place 1 tablet (0.4 mg total) under the tongue as needed.  75 tablet  3  . potassium chloride SA (K-DUR,KLOR-CON) 20 MEQ tablet Take 10 mEq by mouth daily.      . prednisoLONE sodium phosphate (INFLAMASE FORTE) 1 % ophthalmic solution Place 1 drop into both eyes daily.       . RABEprazole (ACIPHEX) 20 MG tablet TAKE 1 TABLET DAILY  90 tablet  3  . RANEXA 1000 MG SR tablet TAKE 1 TABLET BY MOUTH 2 TIMES A DAY  180 tablet  3  . Vitamin D, Ergocalciferol, (DRISDOL) 50000 UNITS CAPS TAKE 1 CAPSULE BY MOUTH ONCE A WEEK  13 capsule  2  . ZETIA 10 MG tablet TAKE 1 TABLET DAILY  90 tablet  3   No facility-administered encounter medications on file as of 10/17/2012.

## 2012-10-17 NOTE — Assessment & Plan Note (Signed)
Multifactorial in nature, likely related to restrictive lung disease, aspiration, and CHF. Currently stable.  Plan: -Continue 2 L continuously and 3 L with exertion

## 2012-10-17 NOTE — Assessment & Plan Note (Signed)
I encouraged her to try taking Zyrtec at night rather than during the day. Also advised using saline sprays. Continue Flonase.

## 2012-10-22 ENCOUNTER — Encounter: Payer: Self-pay | Admitting: Family Medicine

## 2012-10-23 ENCOUNTER — Encounter: Payer: Self-pay | Admitting: Family Medicine

## 2012-10-24 ENCOUNTER — Other Ambulatory Visit: Payer: Self-pay | Admitting: *Deleted

## 2012-10-24 ENCOUNTER — Other Ambulatory Visit: Payer: Self-pay | Admitting: Cardiovascular Disease

## 2012-10-24 MED ORDER — METOPROLOL TARTRATE 25 MG PO TABS: 25.0000 mg | ORAL_TABLET | Freq: Two times a day (BID) | ORAL | Status: DC

## 2012-10-24 NOTE — Telephone Encounter (Signed)
[facility]

## 2012-11-02 ENCOUNTER — Encounter: Payer: Self-pay | Admitting: Family Medicine

## 2012-11-02 ENCOUNTER — Ambulatory Visit (INDEPENDENT_AMBULATORY_CARE_PROVIDER_SITE_OTHER): Payer: Medicare Other | Admitting: Family Medicine

## 2012-11-02 VITALS — BP 124/74 | HR 75 | Temp 97.7°F | Wt 143.2 lb

## 2012-11-02 DIAGNOSIS — R269 Unspecified abnormalities of gait and mobility: Secondary | ICD-10-CM | POA: Diagnosis not present

## 2012-11-02 DIAGNOSIS — R2681 Unsteadiness on feet: Secondary | ICD-10-CM

## 2012-11-02 MED ORDER — LORAZEPAM 1 MG PO TABS: 1.0000 mg | ORAL_TABLET | Freq: Every evening | ORAL | Status: DC | PRN

## 2012-11-02 NOTE — Progress Notes (Signed)
Subjective:    Patient ID: Christine Blanchard, female    DOB: Feb 25, 1928, 79 y.o.   MRN: [id number]  HPI CC: discuss ALF  [name] is a pleasant 79 yo with h/o HTN, DM, h/o CVA after cath, severe CAD s/p multiple interventions now planned medical management, diastolic CHF who presents to discuss transition to higher level of care.  Currently lives in [facility] [place] at independent living side in a 1 story house with 2 bedrooms and 2 bathrooms.  Receives 1 meal a day at [employer].  Has to be able to get to dining [place].  Some trouble with this, even when she uses her rolling walker with chair.  Has to take 2 breaks each time - sitting down to rest.  Some house care included. Increasing falls recently.  Family would like her to transition to ALF. ALF - would be [facility].  Sometimes forgets to eat at night - some lows from this.  Activities of Daily Living:     Bathing- partially dependent (getting in and out of shower)    Dressing- independent    Eating- independent    Toileting- independent     Transferring- partially dependent (depending on the chair)    Continence- dependent (uses depends regularly) Overall Assessment: Partially Dependent  Instrumental Activities of Daily Living:     Transportation- dependent    Meal/Food Preparation- partially dependent    Shopping Errands- dependent    Housekeeping/Chores- dependent    Money Management/Finances- partially dependent (DIL helps)    Medication Management- INdependent (uses ipad)    Ability to Use Telephone- [facility]- dependent Overall Assessment: Dependent   Past Medical History  Diagnosis Date  . Hypertension   . Diabetes mellitus 2003    previously saw Dr. Leslie Dales  . Hyperlipidemia   . History of chicken pox     has had shingles shot  . History of diverticulitis of colon 1991, 1992, 1994  . History of kidney stones 1952  . CVA (cerebral infarction)     after a stent, minimal R residual weakness,  affects speech when tired, ?mild dysequilibrium  . Urinary incontinence     stress/urge, per D. Tannenbaum/Lomax  . CAD (coronary artery disease)     severe, s/p 4 cardiac caths, minimally invasive CABG @[facility] , ICA stent 2004 then LAD stent 2005, only medical management recommended now  . Breathing difficulty 2011    on O2 since 10/2009  . Syncope 2005    s/p w/u including CT scan and 24 hour Holter  . GERD (gastroesophageal reflux disease) 2004  . Macular degeneration     s/p corneal transplants  . Benign essential tremor     toprol XL and remeron  . Herpes zoster ophthalmicus 2009    history  . Hearing loss of both ears 08/2010    audiology eval 08/2010 - mod sensorineural heaing loss, rec trial digital hearing aids and re eval yearly to monitor  . Vitamin D deficiency     on 50k units weekly  . Diastolic CHF 03/2010    grade 1, normal EF, mildly dilated LA, normal PA pressures  . Lumbar spinal stenosis     with severe spondylosis, s/p laminotomy  . Allergic rhinitis   . Chronic kidney disease     stage 3 kidney disease.  . Leg weakness, bilateral 2014    s/p neuro eval - thought multifactorial (diabetic neuropathy, lumbar stenosis, and chronic disease deconditioning).  Conservative management recommended  Review of Systems Per HPI    Objective:   Physical Exam  Nursing note and vitals reviewed. Constitutional: She appears well-developed and well-nourished. No distress.  Psychiatric: She has a normal mood and affect.       Assessment & Plan:

## 2012-11-02 NOTE — Patient Instructions (Addendum)
I agree that we need to work towards transition to assisted living. Send me any forms that may need to be filled out. Good to see you today!

## 2012-11-02 NOTE — Assessment & Plan Note (Signed)
Recurrent falls in multifactorial neuropathy leading to progressively worsening gait instability. Progressively worsening stamina. I did discuss with patient pros of assisted living. Partially dependent in ADLs, dependent in IADLs. I do agree with transition to higher level of care in assisted living at [facility].  Pt is not happy for transition recommended but agrees.

## 2012-11-03 ENCOUNTER — Telehealth: Payer: Self-pay | Admitting: Family Medicine

## 2012-11-03 MED ORDER — LORAZEPAM 1 MG PO TABS: 1.0000 mg | ORAL_TABLET | Freq: Every evening | ORAL | Status: DC | PRN

## 2012-11-03 NOTE — Telephone Encounter (Signed)
[facility]

## 2012-11-03 NOTE — Telephone Encounter (Signed)
Printed and placed in Kim's box.  [facility].

## 2012-11-20 ENCOUNTER — Encounter: Payer: Self-pay | Admitting: Family Medicine

## 2012-11-20 ENCOUNTER — Ambulatory Visit: Payer: Medicare Other | Admitting: Family Medicine

## 2012-11-20 ENCOUNTER — Telehealth: Payer: Self-pay | Admitting: Family Medicine

## 2012-11-20 ENCOUNTER — Ambulatory Visit (INDEPENDENT_AMBULATORY_CARE_PROVIDER_SITE_OTHER): Payer: Medicare Other | Admitting: Family Medicine

## 2012-11-20 ENCOUNTER — Ambulatory Visit: Payer: Self-pay | Admitting: Family Medicine

## 2012-11-20 VITALS — BP 134/82 | HR 72 | Temp 98.1°F | Wt 144.2 lb

## 2012-11-20 DIAGNOSIS — S0990XA Unspecified injury of head, initial encounter: Secondary | ICD-10-CM

## 2012-11-20 NOTE — Assessment & Plan Note (Signed)
Sustained after fall today -given location of left temporal region, will obtain head CT without contrast to eval for bleed as well as for temporal skull fracture Pt agrees with plan. - will call with results of imaging study

## 2012-11-20 NOTE — Patient Instructions (Signed)
I'd like to get CT of left side of head today - pass by Marion's office for referral. We will call you with results.

## 2012-11-20 NOTE — Telephone Encounter (Signed)
Please ensure no LOC. If not, ok to see in office, please schedule with provider with open slot (30 min if able with Dr. Para March),

## 2012-11-20 NOTE — Progress Notes (Signed)
  Subjective:    Patient ID: Christine Blanchard, female    DOB: 13-Aug-1928, 79 y.o.   MRN: [id number]  HPI CC: fall this morning  DOI: 11/20/2012  Fall occurred this morning - when she got out of bed.  Larey Seat forward and hit her left cheek.  Has iced cheek. Known h/o multifactorial dysequilibrium.    No LOC.  Left face aching.  Taking nothing for discomfort.  Currently 4/10 pain.  Continues working with long term care for possible transition to [facility] assisted living.  On plavix and aspirin.  Past Medical History  Diagnosis Date  . Hypertension   . Diabetes mellitus 2003    previously saw Dr. Leslie Dales  . Hyperlipidemia   . History of chicken pox     has had shingles shot  . History of diverticulitis of colon 1991, 1992, 1994  . History of kidney stones 1952  . CVA (cerebral infarction)     after a stent, minimal R residual weakness, affects speech when tired, ?mild dysequilibrium  . Urinary incontinence     stress/urge, per D. Tannenbaum/Lomax  . CAD (coronary artery disease)     severe, s/p 4 cardiac caths, minimally invasive CABG @[facility] , ICA stent 2004 then LAD stent 2005, only medical management recommended now  . Breathing difficulty 2011    on O2 since 10/2009  . Syncope 2005    s/p w/u including CT scan and 24 hour Holter  . GERD (gastroesophageal reflux disease) 2004  . Macular degeneration     s/p corneal transplants  . Benign essential tremor     toprol XL and remeron  . Herpes zoster ophthalmicus 2009    history  . Hearing loss of both ears 08/2010    audiology eval 08/2010 - mod sensorineural heaing loss, rec trial digital hearing aids and re eval yearly to monitor  . Vitamin D deficiency     on 50k units weekly  . Diastolic CHF 03/2010    grade 1, normal EF, mildly dilated LA, normal PA pressures  . Lumbar spinal stenosis     with severe spondylosis, s/p laminotomy  . Allergic rhinitis   . Chronic kidney disease     stage 3 kidney disease.  . Leg weakness,  bilateral 2014    s/p neuro eval - thought multifactorial (diabetic neuropathy, lumbar stenosis, and chronic disease deconditioning).  Conservative management recommended     Review of Systems Per HPI    Objective:   Physical Exam  Nursing note and vitals reviewed. Constitutional: She appears well-developed and well-nourished. No distress.  HENT:  Head: Normocephalic. Head is with abrasion and with contusion.    Left temporal region point tender to palpation. Small abrasion left upper cheekbone.  Bruising at superior cheek. Slightly diminished temporal artery on left. FROM of TMJ bilaterally without pain or trismus.        Assessment & Plan:

## 2012-11-20 NOTE — Telephone Encounter (Signed)
Patient Information:  Caller Name: Christine Blanchard  Phone: [phone]  Patient: Christine Blanchard, Christine Blanchard  Gender: Female  DOB: May 27, 1928  Age: 79 Years  PCP: Eustaquio [facility])  Office Follow Up:  Does the office need to follow up with this patient?: Yes  Instructions For The Office: PATIENT NEEDS APPT VS ED VISIT FOR FALL. PLEASE CONTACT  RN Note:  Patient is [id number] with health care aide at Bedside. She is on plavix.  She appears to be safe for office visit.  PLEASE CONTACT FOR EVALUATION.  Symptoms  Reason For Call & Symptoms: Patient states she stood up to go to the bathroom and she fell forward and lost her balance. Struck her upper left  cheek towards eye/ear area on a large porceline tree flower  pot. Onset today 08:30 am.  There is no bleeding. No LOC. She has bruising .small amount of swelling. Patient is Plavix. Health Care worker is at bedside  Reviewed Health History In EMR: Yes  Reviewed Medications In EMR: Yes  Reviewed Allergies In EMR: Yes  Reviewed Surgeries / Procedures: Yes  Date of Onset of Symptoms: 11/20/2012  Treatments Tried: ice  Treatments Tried Worked: Yes  Guideline(s) Used:  Face Injury  Disposition Per Guideline:   Go to ED Now (or to Office with PCP Approval)  Reason For Disposition Reached:   Taking Coumadin (warfarin), Pradaxa (dabigatran), or known bleeding disorder (e.g., thrombocytopenia)  Advice Given:  Reassurance - Direct Blow (Contusion, Bruise)  A direct blow to your face can cause a contusion. Contusion is the medical term for bruise.  Symptoms are mild pain, swelling, and/or bruising.  Here is some care advice that should help.  Apply a Cold Pack:  Apply a cold pack or an ice bag (wrapped in a moist towel) to the area for 20 minutes. Repeat in 1 hour, then every 4 hours while awake.  Continue this for the first 48 hours after an injury.  This will help decrease pain and swelling.  Call Back If:  Swelling or bruise becomes over 2  inches (5 cm).  You become worse.  Patient Will Follow Care Advice:  YES

## 2012-11-20 NOTE — Telephone Encounter (Signed)
Spoke with patient and confirmed no LOC. She will come in today for eval with Dr. Para March. Appt scheduled.

## 2012-11-28 ENCOUNTER — Telehealth: Payer: Self-pay

## 2012-11-28 NOTE — Telephone Encounter (Signed)
Dianne pts daughter left v/m; Graciella Belton has sent forms for completion for [facility] by [employer] come to Dr Reece Agar office by 10 AM on 11/29/12. Dianne wants to know if forms can be completed by 12/01/12. Dianne said pt has been on waiting list at [facility] and Graciella Belton was just notified room will be available this weekend. Dianne request cb.

## 2012-11-28 NOTE — Telephone Encounter (Signed)
plz notify we will fill forms then they arrive and call her when complete.

## 2012-11-28 NOTE — Telephone Encounter (Signed)
Message left notifying patient's daughter.  

## 2012-11-29 ENCOUNTER — Encounter: Payer: Self-pay | Admitting: Radiology

## 2012-11-30 ENCOUNTER — Telehealth: Payer: Self-pay | Admitting: *Deleted

## 2012-11-30 ENCOUNTER — Ambulatory Visit (INDEPENDENT_AMBULATORY_CARE_PROVIDER_SITE_OTHER): Payer: Medicare Other | Admitting: *Deleted

## 2012-11-30 DIAGNOSIS — Z0279 Encounter for issue of other medical certificate: Secondary | ICD-10-CM

## 2012-11-30 NOTE — Telephone Encounter (Signed)
Patient notified. I will let her know when complete.

## 2012-11-30 NOTE — Telephone Encounter (Signed)
Sorry will need more time to fill out.

## 2012-11-30 NOTE — Telephone Encounter (Signed)
Received FL2 paperwork. I filled out as much as I could for you. She is coming in today at 3 for PPD, (She is having it read at [facility]) and hopes to pick it up at that time if possible.

## 2012-12-01 ENCOUNTER — Telehealth: Payer: Self-pay

## 2012-12-01 NOTE — Telephone Encounter (Signed)
Patient notified and paperwork placed up front for pick up.  

## 2012-12-01 NOTE — Telephone Encounter (Signed)
Faxed as requested

## 2012-12-01 NOTE — Telephone Encounter (Signed)
Filled out and placed in Kim's box. plz fill out highlighted areas.

## 2012-12-01 NOTE — Telephone Encounter (Signed)
Signed and placed in Kim's box. 

## 2012-12-01 NOTE — Telephone Encounter (Signed)
Checking on status of fax sent for physician order for home health certification plan of care to verify got order and hard copy will come in the mail also to be returned to homecare providers.

## 2012-12-04 DIAGNOSIS — I509 Heart failure, unspecified: Secondary | ICD-10-CM | POA: Diagnosis not present

## 2012-12-05 ENCOUNTER — Other Ambulatory Visit: Payer: Self-pay

## 2012-12-05 MED ORDER — POTASSIUM CHLORIDE CRYS ER 20 MEQ PO TBCR: 10.0000 meq | EXTENDED_RELEASE_TABLET | Freq: Every day | ORAL | Status: DC

## 2012-12-05 NOTE — Telephone Encounter (Signed)
plz notify I've sent in potassium to local pharmacy x 1 month. Also - ok to change cbg checks to BID (fasting and prior to a meal - alternating meal), or prn feeling ill.  Lab Results  Component Value Date   HGBA1C 6.6* 08/14/2012

## 2012-12-05 NOTE — Telephone Encounter (Addendum)
Christine Blanchard pts daughter said Christine Blanchard needs a written order to ck pts BS three times a day. Christine Blanchard said pt request since BS has been stable to decrease cking BS twice a day if OK with Dr Reece Agar. Christine Blanchard also said Christine Blanchard has ordered pts potassium but will take 2-3 weeks to received and Christine Blanchard was requested to bring Potassium to [facility] for them to give to pt until med supply comes in from pharmacy. [facility].Christine Blanchard request cb when completed. Christine Blanchard phone # [phone] and fax # [phone].

## 2012-12-06 NOTE — Telephone Encounter (Signed)
Order faxed and patient's daughter notified.

## 2012-12-07 ENCOUNTER — Other Ambulatory Visit: Payer: Self-pay | Admitting: Cardiovascular Disease

## 2012-12-07 ENCOUNTER — Telehealth: Payer: Self-pay

## 2012-12-07 NOTE — Telephone Encounter (Signed)
Pt states she needs dr Mariah Milling permission to keep her nitroglycerin in her room, instead of at the nurses desk.

## 2012-12-08 NOTE — Telephone Encounter (Signed)
Okay for her to have nitroglycerin at the bedside given her severe diffuse CAD Would stress that she needs to be sitting down or in bed when she takes nitroglycerin as blood pressure typically runs low Would be better if she lets nurses know when she takes it as they can check on her

## 2012-12-11 NOTE — Telephone Encounter (Signed)
Spoke w/ pt.  She states that she will leave the nitro with the nurse, as they need to check on her once she takes it, she will just have them give it to her.  Pt then states that it is a "state law that I can't have it in my room, but I was hoping I could get around that."

## 2012-12-12 ENCOUNTER — Encounter: Payer: Self-pay | Admitting: Family Medicine

## 2012-12-15 ENCOUNTER — Telehealth: Payer: Self-pay | Admitting: Family Medicine

## 2012-12-15 NOTE — Telephone Encounter (Signed)
Filled and placed in Kim's box. 

## 2012-12-15 NOTE — Telephone Encounter (Signed)
See Mychart message. OK to have Lorazepam scheduled QHS?

## 2012-12-15 NOTE — Telephone Encounter (Signed)
Forms in your IN box from [facility] for completion.

## 2012-12-15 NOTE — Telephone Encounter (Signed)
I think you've already taken care of this.

## 2012-12-15 NOTE — Telephone Encounter (Signed)
Ok to do - have changed ativan to qhs and not prn Written script placed in Kim's box

## 2012-12-15 NOTE — Telephone Encounter (Signed)
6 month medication form dropped off.  Please call [phone] when complete

## 2012-12-18 NOTE — Telephone Encounter (Signed)
Forms faxed to [employer] at [facility].

## 2012-12-26 ENCOUNTER — Ambulatory Visit: Payer: Medicare Other | Admitting: Cardiovascular Disease

## 2012-12-27 ENCOUNTER — Ambulatory Visit (INDEPENDENT_AMBULATORY_CARE_PROVIDER_SITE_OTHER): Payer: Medicare Other | Admitting: Cardiovascular Disease

## 2012-12-27 ENCOUNTER — Encounter: Payer: Self-pay | Admitting: Cardiovascular Disease

## 2012-12-27 VITALS — BP 93/61 | HR 63 | Ht 65.0 in | Wt 142.5 lb

## 2012-12-27 DIAGNOSIS — R0602 Shortness of breath: Secondary | ICD-10-CM

## 2012-12-27 DIAGNOSIS — I10 Essential (primary) hypertension: Secondary | ICD-10-CM

## 2012-12-27 DIAGNOSIS — I25810 Atherosclerosis of coronary artery bypass graft(s) without angina pectoris: Secondary | ICD-10-CM | POA: Diagnosis not present

## 2012-12-27 DIAGNOSIS — R079 Chest pain, unspecified: Secondary | ICD-10-CM

## 2012-12-27 DIAGNOSIS — R29898 Other symptoms and signs involving the musculoskeletal system: Secondary | ICD-10-CM

## 2012-12-27 DIAGNOSIS — I5032 Chronic diastolic (congestive) heart failure: Secondary | ICD-10-CM | POA: Diagnosis not present

## 2012-12-27 DIAGNOSIS — I509 Heart failure, unspecified: Secondary | ICD-10-CM

## 2012-12-27 MED ORDER — ISOSORBIDE MONONITRATE 10 MG PO TABS: 10.0000 mg | ORAL_TABLET | Freq: Two times a day (BID) | ORAL | Status: DC

## 2012-12-27 NOTE — Patient Instructions (Signed)
You are doing well. Please decrease the aspirin down to 81  Mg x 2 daily Stop the isosorbide 1/2 pill twice a day Start isosorbide mononitrate 10 mg twice a day  Please call us if you have new issues that need to be addressed before your next appt.  Your physician wants you to follow-up in: 6 months.  You will receive a reminder letter in the mail two months in advance. If you don't receive a letter, please call our office to schedule the follow-up appointment.

## 2012-12-27 NOTE — Assessment & Plan Note (Signed)
Blood pressures running low. We will decrease her isosorbide down to 10 mg twice a day, down from 30 mg twice a day

## 2012-12-27 NOTE — Assessment & Plan Note (Signed)
Currently uses a walker. No recent falls 

## 2012-12-27 NOTE — Assessment & Plan Note (Signed)
Appears relatively stable. Followed by pulmonary. On chronic oxygen. Encouraged her to stay on her Lasix daily

## 2012-12-27 NOTE — Assessment & Plan Note (Signed)
Appears to be relatively euvolemic on today's visit. Continue Lasix daily

## 2012-12-27 NOTE — Assessment & Plan Note (Signed)
1 recent episode of chest tightness, relieved with nitroglycerin. We have suggested she have nitroglycerin by her bedside at all times

## 2012-12-27 NOTE — Progress Notes (Signed)
Patient ID: Christine Blanchard, female    DOB: 1929-01-27, 79 y.o.   MRN: [id number]  HPI Comments: Christine Blanchard is a very pleasant 79 year old woman with history of coronary artery disease, bypass surgery in 2004, hyperlipidemia, PCI 6 months after her bypass, repeat PCI one year later (She does report having a stroke after her cardiac catheterization), diabetes, hypertension, spinal stenosis who currently lives in a nursing home in [place], who has chronic unsteady gait  with worsening of her shortness of breath over the past year and  on chronic oxygen who presents for routine followup. Pulmonary workup has revealed restrictive lung disease. She sees Dr. Kendrick Fries, on chronic oxygen. She currently lives at [facility]	[place].  She has been taking Lasix 40 mg daily and reports having less shortness of breath, less edema. Blood pressure has been running low. Occasional lightheaded spells. No falls or syncope. Legs are weak, walks with a walker. No regular exercise program.  Denies any cough, worsening shortness of breath. He does her oxygen level at 2 L even with ambulation. Weight has been relatively stable. One episode of chest tightness. She's not particularly worried. It took some time for her to get her nitroglycerin from the nurses.  Previous cardiac CTA in the past for atypical chest pain that showed: Patent SVG to D1 with poor distal runoff,  Occluded LIMA to LAD.  Distal LAD never visualized,  RCA and circumflex patient with multiple 50% or less calcfic Lesions,  Poor distal runoff from stented IM/LAD and small D1 supplied by SVG  Previous  Echocardiogram was essentially normal with normal ejection fraction greater than 55%, diastolic dysfunction, normal right ventricular systolic pressures  Stress test showed no ischemia with ejection fraction greater than 55%   12/2011 In the hospital, total cholesterol 146, LDL 58, HDL 65  EKG 06/12/2012  shows Normal sinus rhythm with rate 63 beats per minute with  poor R-wave progression through the anterior precordial leads, nonspecific T wave abnormality. No significant change from her prior EKG   Outpatient Encounter Prescriptions as of 12/27/2012  Medication Sig  . ACCU-CHEK SOFTCLIX LANCETS lancets Check blood sugar three times a day and as directed. 250.00  . aspirin 325 MG tablet Take 325 mg by mouth daily.    Marland Kitchen atorvastatin (LIPITOR) 20 MG tablet TAKE 1 TABLET BY MOUTH NIGHTLY  . Blood Glucose Monitoring Suppl (ACCU-CHEK AVIVA PLUS) W/DEVICE KIT Check blood sugar three times a day and as directed.250.00, insulin dependent  . Casanthranol-Docusate Sodium 30-100 MG CAPS as needed.    . cetirizine (ZYRTEC ALLERGY) 10 MG tablet Take 1 tablet (10 mg total) by mouth daily.  . citalopram (CELEXA) 20 MG tablet TAKE 1 TABLET DAILY  . clopidogrel (PLAVIX) 75 MG tablet Take 1 tablet (75 mg total) by mouth daily.  . fenofibrate micronized (ANTARA) 130 MG capsule TAKE 1 CAPSULE DAILY  . fluticasone (FLONASE) 50 MCG/ACT nasal spray Place 2 sprays into the nose daily.  . furosemide (LASIX) 40 MG tablet Take 40 mg by mouth daily.  Marland Kitchen gabapentin (NEURONTIN) 100 MG capsule Take 1 capsule (100 mg total) by mouth 2 (two) times daily.  Marland Kitchen glucose blood (ACCU-CHEK AVIVA) test strip Use as instructed  . insulin glargine (LANTUS SOLOSTAR) 100 UNIT/ML injection Inject 0.18 mLs (18 Units total) into the skin daily.  . Insulin Pen Needle (B-D ULTRAFINE III SHORT PEN) 31G X 8 MM MISC Use as directed  . isosorbide mononitrate (IMDUR) 60 MG 24 hr tablet Take 30 mg by  mouth 2 (two) times daily.   Marland Kitchen JANUVIA 100 MG tablet TAKE 1 TABLET (100 MG TOTAL) DAILY  . LORazepam (ATIVAN) 1 MG tablet Take 1 tablet (1 mg total) by mouth at bedtime. insomnia  . metFORMIN (GLUCOPHAGE-XR) 500 MG 24 hr tablet TAKE 2 TABLETS (1,000 MG TOTAL) AT BEDTIME  . metoprolol tartrate (LOPRESSOR) 25 MG tablet Take 1 tablet (25 mg total) by mouth 2 (two) times daily.  . Multiple Vitamin (MULTIVITAMIN)  capsule Take 1 capsule by mouth daily.    . nitroGLYCERIN (NITROSTAT) 0.4 MG SL tablet Place 1 tablet (0.4 mg total) under the tongue as needed.  . potassium chloride SA (K-DUR,KLOR-CON) 20 MEQ tablet Take 0.5 tablets (10 mEq total) by mouth daily.  . prednisoLONE sodium phosphate (INFLAMASE FORTE) 1 % ophthalmic solution Place 1 drop into both eyes daily.   . RABEprazole (ACIPHEX) 20 MG tablet TAKE 1 TABLET DAILY  . RANEXA 1000 MG SR tablet TAKE 1 TABLET TWICE A DAY  . Vitamin D, Ergocalciferol, (DRISDOL) 50000 UNITS CAPS TAKE 1 CAPSULE BY MOUTH ONCE A WEEK  . ZETIA 10 MG tablet TAKE 1 TABLET DAILY    Review of Systems  HENT: Negative.   Eyes: Negative.   Respiratory: Positive for shortness of breath.   Cardiovascular: Negative.   Gastrointestinal: Negative.   Musculoskeletal: Positive for gait problem.       Profound leg weakness  Skin: Negative.   Neurological: Positive for weakness.  Psychiatric/Behavioral: Negative.   All other systems reviewed and are negative.    BP 93/61  Pulse 63  Ht 5\' 5"  (1.651 m)  Wt 142 lb 8 oz (64.638 kg)  BMI 23.71 kg/m2  Physical Exam  Nursing note and vitals reviewed. Constitutional: She is oriented to person, place, and time. She appears well-developed and well-nourished.  On chronic oxygen nasal cannula  HENT:  Head: Normocephalic.  Nose: Nose normal.  Mouth/Throat: Oropharynx is clear and moist.  Eyes: Conjunctivae are normal. Pupils are equal, round, and reactive to light.  Neck: Normal range of motion. Neck supple. No JVD present.  Cardiovascular: Normal rate, regular rhythm, S1 normal, S2 normal and intact distal pulses.  Exam reveals no gallop and no friction rub.   Murmur heard.  Crescendo systolic murmur is present with a grade of 2/6  Pulmonary/Chest: Effort normal. No respiratory distress. She has decreased breath sounds. She has no wheezes. She has no rales. She exhibits no tenderness.  Abdominal: Soft. Bowel sounds are  normal. She exhibits no distension. There is no tenderness.  Musculoskeletal: Normal range of motion. She exhibits no tenderness.  Lymphadenopathy:    She has no cervical adenopathy.  Neurological: She is alert and oriented to person, place, and time. Coordination normal.  Skin: Skin is warm and dry. No rash noted. No erythema.  Psychiatric: She has a normal mood and affect. Her behavior is normal. Judgment and thought content normal.    Assessment and Plan

## 2012-12-31 ENCOUNTER — Other Ambulatory Visit: Payer: Self-pay | Admitting: Family Medicine

## 2013-01-02 ENCOUNTER — Other Ambulatory Visit: Payer: Self-pay | Admitting: Family Medicine

## 2013-01-10 ENCOUNTER — Other Ambulatory Visit: Payer: Self-pay | Admitting: Family Medicine

## 2013-01-12 ENCOUNTER — Other Ambulatory Visit: Payer: Self-pay | Admitting: Family Medicine

## 2013-01-13 ENCOUNTER — Other Ambulatory Visit: Payer: Self-pay | Admitting: Family Medicine

## 2013-01-13 DIAGNOSIS — E1121 Type 2 diabetes mellitus with diabetic nephropathy: Secondary | ICD-10-CM

## 2013-01-15 ENCOUNTER — Other Ambulatory Visit (INDEPENDENT_AMBULATORY_CARE_PROVIDER_SITE_OTHER): Payer: Medicare Other

## 2013-01-15 DIAGNOSIS — E1129 Type 2 diabetes mellitus with other diabetic kidney complication: Secondary | ICD-10-CM

## 2013-01-15 DIAGNOSIS — E1121 Type 2 diabetes mellitus with diabetic nephropathy: Secondary | ICD-10-CM

## 2013-01-15 LAB — BASIC METABOLIC PANEL
CO2: 32 mEq/L (ref 19–32)
Chloride: 97 mEq/L (ref 96–112)
Creatinine, Ser: 1.1 mg/dL (ref 0.4–1.2)
Glucose, Bld: 89 mg/dL (ref 70–99)
Potassium: 4 mEq/L (ref 3.5–5.1)
Sodium: 138 mEq/L (ref 135–145)

## 2013-01-18 ENCOUNTER — Ambulatory Visit (INDEPENDENT_AMBULATORY_CARE_PROVIDER_SITE_OTHER): Payer: Medicare Other | Admitting: Family Medicine

## 2013-01-18 ENCOUNTER — Encounter: Payer: Self-pay | Admitting: Family Medicine

## 2013-01-18 VITALS — BP 112/66 | HR 69 | Temp 97.8°F | Wt 142.2 lb

## 2013-01-18 DIAGNOSIS — E1121 Type 2 diabetes mellitus with diabetic nephropathy: Secondary | ICD-10-CM

## 2013-01-18 DIAGNOSIS — N183 Chronic kidney disease, stage 3 unspecified: Secondary | ICD-10-CM

## 2013-01-18 DIAGNOSIS — E1129 Type 2 diabetes mellitus with other diabetic kidney complication: Secondary | ICD-10-CM

## 2013-01-18 NOTE — Progress Notes (Signed)
Subjective:    Patient ID: Christine Blanchard, female    DOB: 05/24/1928, 79 y.o.   MRN: [id number]  HPI CC: 5 mo f/u  Christine Blanchard is a pleasant 79 yo with h/o HTN, DM, h/o CVA after cath, severe CAD s/p multiple interventions now planned medical management, diastolic CHF. Presents today for 77mo f/u visit.  Recently isosorbide decreased to 10mg  twice daily  Doesn't notice significant difference in dosing. Long term care has gone through. Now living at [facility] - she is only able to have nitroglycerin at her bedside but not her ativan - this has been changed to scheduled.    DM - Checks blood sugars bid. Morning running 85, at lunch run 80s.  No lower sugars.  No significant high sugars.  Pt states last eye exam was 6 months ago (cornea transplant doctor).  Foot exam today.  Does know to eat bedtime snack at 10pm. Lab Results  Component Value Date   HGBA1C 6.2 01/15/2013    Past Medical History  Diagnosis Date  . Hypertension   . Diabetes mellitus 2003    previously saw Dr. Leslie Dales  . Hyperlipidemia   . History of chicken pox     has had shingles shot  . History of diverticulitis of colon 1991, 1992, 1994  . History of kidney stones 1952  . CVA (cerebral infarction)     after a stent, minimal R residual weakness, affects speech when tired, ?mild dysequilibrium  . Urinary incontinence     stress/urge, per D. Tannenbaum/[place]  . CAD (coronary artery disease)     severe, s/p 4 cardiac caths, minimally invasive CABG @[facility] , ICA stent 2004 then LAD stent 2005, only medical management recommended now  . Breathing difficulty 2011    on O2 since 10/2009  . Syncope 2005    s/p w/u including CT scan and 24 hour Holter  . GERD (gastroesophageal reflux disease) 2004  . Macular degeneration     s/p corneal transplants  . Benign essential tremor     toprol XL and remeron  . Herpes zoster ophthalmicus 2009    history  . Hearing loss of both ears 08/2010    audiology eval 08/2010 - mod  sensorineural heaing loss, rec trial digital hearing aids and re eval yearly to monitor  . Vitamin D deficiency     on 50k units weekly  . Diastolic CHF 03/2010    grade 1, normal EF, mildly dilated LA, normal PA pressures  . Lumbar spinal stenosis     with severe spondylosis, s/p laminotomy  . Allergic rhinitis   . Chronic kidney disease     stage 3 kidney disease.  . Leg weakness, bilateral 2014    s/p neuro eval - thought multifactorial (diabetic neuropathy, lumbar stenosis, and chronic disease deconditioning).  Conservative management recommended  . Gait instability      Review of Systems Per HPI    Objective:   Physical Exam  Nursing note and vitals reviewed. Constitutional: She appears well-developed and well-nourished. No distress.  HENT:  Head: Normocephalic and atraumatic.  Mouth/Throat: Oropharynx is clear and moist. No oropharyngeal exudate.  Eyes: Conjunctivae and EOM are normal. Pupils are equal, round, and reactive to light. No scleral icterus.  Neck: Normal range of motion. Neck supple.  Cardiovascular: Normal rate, regular rhythm, normal heart sounds and intact distal pulses.   No murmur heard. Pulmonary/Chest: Effort normal and breath sounds normal. No respiratory distress. She has no wheezes. She has no  rales.  Musculoskeletal: She exhibits no edema.  Diabetic foot exam: Normal inspection No skin breakdown No calluses  Normal DP/PT pulses Normal sensation to light touch and decreased to monofilament bilaterally Nails normal  Lymphadenopathy:    She has no cervical adenopathy.  Skin: Skin is warm and dry. No rash noted.  Psychiatric: She has a normal mood and affect.       Assessment & Plan:

## 2013-01-18 NOTE — Assessment & Plan Note (Signed)
Reviewed dx with patient.  Discussed avoiding NSAIDs and staying hydrated (which she already does)

## 2013-01-18 NOTE — Patient Instructions (Signed)
Let's decrease lantus to 16 units nightly. Continue other medicines as up to now.   I think you are doing well. Good to see you today, call us with quesitons. Return in 4 months for wellness visit.

## 2013-01-18 NOTE — Progress Notes (Signed)
Pre-visit discussion using our clinic review tool. No additional management support is needed unless otherwise documented below in the visit note.  

## 2013-01-18 NOTE — Assessment & Plan Note (Signed)
Chronic, stable. Some borderline low sugars so will decrease lantus to 16 u daily. H/o diabetic neuropathy and nephropathy.  Per pt UTD eye exam. Lab Results  Component Value Date   HGBA1C 6.2 01/15/2013

## 2013-01-22 ENCOUNTER — Ambulatory Visit: Payer: Medicare Other | Admitting: Family Medicine

## 2013-01-23 ENCOUNTER — Other Ambulatory Visit: Payer: Self-pay

## 2013-01-23 ENCOUNTER — Other Ambulatory Visit: Payer: Self-pay | Admitting: Cardiovascular Disease

## 2013-01-23 MED ORDER — ISOSORBIDE MONONITRATE 20 MG PO TABS: ORAL_TABLET | ORAL | Status: DC

## 2013-01-23 NOTE — Telephone Encounter (Signed)
Refill sent for isosorbide mono 20 mg take 1/2 tablet twice a day.

## 2013-01-23 NOTE — Telephone Encounter (Signed)
Requested Prescriptions   Signed Prescriptions Disp Refills  . isosorbide mononitrate (ISMO,MONOKET) 20 MG tablet 30 tablet 3    Sig: TAKE 1/2 TABLET (=10MG ) BY MOUTH TWICE A DAY    Authorizing Provider: Antonieta Iba    Ordering User: Kendrick Fries

## 2013-01-24 ENCOUNTER — Other Ambulatory Visit: Payer: Self-pay | Admitting: Family Medicine

## 2013-01-31 ENCOUNTER — Encounter: Payer: Self-pay | Admitting: Family Medicine

## 2013-02-01 DIAGNOSIS — S22070A Wedge compression fracture of T9-T10 vertebra, initial encounter for closed fracture: Secondary | ICD-10-CM

## 2013-02-01 HISTORY — DX: Wedge compression fracture of T9-T10 vertebra, initial encounter for closed fracture: S22.070A

## 2013-02-02 ENCOUNTER — Other Ambulatory Visit: Payer: Self-pay | Admitting: Family Medicine

## 2013-02-08 ENCOUNTER — Telehealth: Payer: Self-pay

## 2013-02-08 NOTE — Telephone Encounter (Signed)
Lilnda Busick at [facility] left v/m requesting pt to have 2nd step TB skin test. Pt had PPD on 11/30/12. Will pt need to have PPD done now and repeat in 2 weeks or is OK that the PPD scheduled this month would be 2nd step PPD for the PPD done in October.Please advise.

## 2013-02-08 NOTE — Telephone Encounter (Signed)
2nd test needs to be done 1-4 wks from initial test so will need 2nd test this time. Is it routine for Christine Blanchard to do 2 step PPD on all residents?

## 2013-02-09 NOTE — Telephone Encounter (Signed)
Spoke with Vaughan Basta. It is routine on new patients. Scheduled 2nd PPD.

## 2013-02-13 ENCOUNTER — Other Ambulatory Visit: Payer: Self-pay | Admitting: Family Medicine

## 2013-02-13 ENCOUNTER — Other Ambulatory Visit: Payer: Self-pay | Admitting: *Deleted

## 2013-02-13 MED ORDER — POTASSIUM CHLORIDE CRYS ER 10 MEQ PO TBCR: EXTENDED_RELEASE_TABLET | ORAL | Status: DC

## 2013-02-15 ENCOUNTER — Other Ambulatory Visit: Payer: Self-pay

## 2013-02-15 NOTE — Telephone Encounter (Signed)
Christine Blanchard with [name] said pt needs refill  On lorazepam called to [facility]. Christine Blanchard thought had been requested earlier in the week; do not see request in pts chart. Pt has one pill for tonight.Please advise.

## 2013-02-16 MED ORDER — LORAZEPAM 1 MG PO TABS: 1.0000 mg | ORAL_TABLET | Freq: Every day | ORAL | Status: DC

## 2013-02-16 NOTE — Telephone Encounter (Signed)
plz phone in to [employer] [facility]

## 2013-02-16 NOTE — Telephone Encounter (Signed)
Called in as directed. 

## 2013-02-16 NOTE — Telephone Encounter (Signed)
Melissa left v/m about lorazepam refill; spoke with Abigail Butts at [facility] and is taken care of. Spoke with Melissa and med issue taken care of.

## 2013-02-20 ENCOUNTER — Encounter: Payer: Self-pay | Admitting: Radiology

## 2013-02-21 ENCOUNTER — Ambulatory Visit (INDEPENDENT_AMBULATORY_CARE_PROVIDER_SITE_OTHER): Payer: Medicare Other

## 2013-02-21 DIAGNOSIS — Z111 Encounter for screening for respiratory tuberculosis: Secondary | ICD-10-CM | POA: Diagnosis not present

## 2013-02-21 NOTE — Progress Notes (Signed)
   Subjective:    Patient ID: Christine Blanchard, female    DOB: 1928-07-16, 80 y.o.   MRN: [id number]  HPI    Review of Systems     Objective:   Physical Exam        Assessment & Plan:  Dr Darnell Level had recommended pt have 2 PPD done within 4 week period for 2 step PPD. Linda at [facility] said state guidelines require 2 PPD one within a year with no time line in between. Christine Blanchard said today could be 2nd PPD with first PPD done 11/30/12. Dr Darnell Level said OK. Christine Blanchard will have nurse at [facility] read TB skin test on 02/23/13.

## 2013-03-12 ENCOUNTER — Telehealth: Payer: Self-pay | Admitting: *Deleted

## 2013-03-12 ENCOUNTER — Other Ambulatory Visit: Payer: Self-pay

## 2013-03-12 MED ORDER — CETIRIZINE HCL 10 MG PO TABS: 10.0000 mg | ORAL_TABLET | Freq: Every day | ORAL | Status: DC

## 2013-03-12 NOTE — Telephone Encounter (Signed)
Levada Dy from [facility] left message on my VM that patient had swelling in her feet. When I called back, Levada Dy was gone and [place] who I spoke with said she was unaware of any problems with Ms. Lundahl. I asked her to leave a message for Levada Dy to call me back tomorrow if there were still any issues. She said she would.

## 2013-03-13 NOTE — Telephone Encounter (Signed)
Spoke with Levada Dy. Weights have been consistent at 142-143 lbs. Spoke with Dr. Darnell Level and he advised to work on leg elevation and ask patient how she would feel about compression stockings. Levada Dy said she would check with patient and let me know.

## 2013-03-13 NOTE — Telephone Encounter (Signed)
Levada Dy returned my call and said that patient DID have bilateral feet and ankle swelling and had told Loma Sousa about it before she left yesterday, so she wasn't sure why Loma Sousa told me that she wasn't aware of it. Regardless, patient still has it today-not as bad as yesterday. She denies CP or SOB. She is taking her lasix as directed.

## 2013-03-13 NOTE — Telephone Encounter (Signed)
plz ensure elevating legs during the day. What have weights been recently?  If trending up, may increase lasix to extra 1/2 tablet at noon for 3 days then update if persistent swelling.  Lab Results  Component Value Date   CREATININE 1.1 01/15/2013   Wt Readings from Last 3 Encounters:  01/18/13 142 lb 4 oz (64.524 kg)  12/27/12 142 lb 8 oz (64.638 kg)  11/20/12 144 lb 4 oz (65.431 kg)

## 2013-03-14 ENCOUNTER — Telehealth: Payer: Self-pay

## 2013-03-14 ENCOUNTER — Emergency Department: Payer: Self-pay

## 2013-03-14 DIAGNOSIS — R059 Cough, unspecified: Secondary | ICD-10-CM | POA: Diagnosis not present

## 2013-03-14 DIAGNOSIS — R0789 Other chest pain: Secondary | ICD-10-CM | POA: Diagnosis not present

## 2013-03-14 DIAGNOSIS — R0602 Shortness of breath: Secondary | ICD-10-CM | POA: Diagnosis not present

## 2013-03-14 DIAGNOSIS — Z951 Presence of aortocoronary bypass graft: Secondary | ICD-10-CM | POA: Diagnosis not present

## 2013-03-14 DIAGNOSIS — R05 Cough: Secondary | ICD-10-CM | POA: Diagnosis not present

## 2013-03-14 DIAGNOSIS — Z79899 Other long term (current) drug therapy: Secondary | ICD-10-CM | POA: Diagnosis not present

## 2013-03-14 DIAGNOSIS — Z8673 Personal history of transient ischemic attack (TIA), and cerebral infarction without residual deficits: Secondary | ICD-10-CM | POA: Diagnosis not present

## 2013-03-14 DIAGNOSIS — Z9861 Coronary angioplasty status: Secondary | ICD-10-CM | POA: Diagnosis not present

## 2013-03-14 DIAGNOSIS — R079 Chest pain, unspecified: Secondary | ICD-10-CM | POA: Diagnosis not present

## 2013-03-14 DIAGNOSIS — R52 Pain, unspecified: Secondary | ICD-10-CM | POA: Diagnosis not present

## 2013-03-14 DIAGNOSIS — E119 Type 2 diabetes mellitus without complications: Secondary | ICD-10-CM | POA: Diagnosis not present

## 2013-03-14 LAB — BASIC METABOLIC PANEL
Anion Gap: 5 — ABNORMAL LOW (ref 7–16)
BUN: 14 mg/dL (ref 7–18)
CHLORIDE: 101 mmol/L (ref 98–107)
CO2: 32 mmol/L (ref 21–32)
Calcium, Total: 8.9 mg/dL (ref 8.5–10.1)
Creatinine: 1.07 mg/dL (ref 0.60–1.30)
EGFR (African American): 56 — ABNORMAL LOW
EGFR (Non-African Amer.): 48 — ABNORMAL LOW
GLUCOSE: 127 mg/dL — AB (ref 65–99)
OSMOLALITY: 278 (ref 275–301)
Potassium: 3.7 mmol/L (ref 3.5–5.1)
Sodium: 138 mmol/L (ref 136–145)

## 2013-03-14 LAB — CBC
HCT: 38 % (ref 35.0–47.0)
HGB: 12.4 g/dL
HGB: 12.4 g/dL (ref 12.0–16.0)
MCH: 29.9 pg (ref 26.0–34.0)
MCHC: 32.6 g/dL (ref 32.0–36.0)
MCV: 92 fL (ref 80–100)
Platelet: 205 10*3/uL (ref 150–440)
RBC: 4.14 10*6/uL (ref 3.80–5.20)
RDW: 14.2 % (ref 11.5–14.5)
WBC: 7.2
WBC: 7.2 10*3/uL (ref 3.6–11.0)
platelet count: 205

## 2013-03-14 LAB — COMPREHENSIVE METABOLIC PANEL
CREATININE: 1.07
Potassium: 3.7 mmol/L
SODIUM: 138 mmol/L (ref 137–147)

## 2013-03-14 LAB — TROPONIN I
Troponin-I: <0.02 ng/mL
Troponin-I: <0.02 ng/mL

## 2013-03-14 NOTE — Telephone Encounter (Signed)
Melissa with [name] said 1st shift called earlier with no cb (?Call a nurse). Melissa said pt started this AM with SOB, pt has had chest congestion, now tightness in chest and difficulty breathing. Swelling in lt leg as usual. Pt told Melissa she feels in distress with breathing. Now temp 99.9 (pt just took Tylenol) and BP 143/71 P 66. Pt does not want to go to ED for eval; request appt on 03/15/13.

## 2013-03-14 NOTE — Telephone Encounter (Signed)
Dr Glori Bickers said pt needs to be evaluated at ED. Pt said she would have to ask her daughter if she can take her; pt refused to go by ambulance. I called and spoke with [name], pts daughter at pt's request and [name] is at [facility] now working and she will call pt and discuss with pt what to do. I advised pt that [name] will call her and that Dr Marliss Coots recommendation is to be evaluated at ED today, not to wait for appt at office on 03/15/13. Pt voiced understanding.

## 2013-03-14 NOTE — Telephone Encounter (Signed)
Thanks for the update- I do feel she should go - will forward to pcp so he is aware -thanks

## 2013-03-15 ENCOUNTER — Encounter: Payer: Self-pay | Admitting: Family Medicine

## 2013-03-15 LAB — URINALYSIS, COMPLETE
BILIRUBIN, UR: NEGATIVE
BLOOD: NEGATIVE
Glucose,UR: NEGATIVE mg/dL (ref 0–75)
Ketone: NEGATIVE
NITRITE: POSITIVE
Ph: 6 (ref 4.5–8.0)
Protein: 30
SPECIFIC GRAVITY: 1.02 (ref 1.003–1.030)

## 2013-03-15 NOTE — Telephone Encounter (Addendum)
i was out of office yesterday afternoon. Difficult case due to extensive non-operative cardiac disease and lung disease. Can we call today for an update and have her come into office for appt today?

## 2013-03-15 NOTE — Telephone Encounter (Signed)
Spoke with patient's DIL and notified her that you were out of the office and that was why she was sent to ER because the covering MD wasn't as familiar with her as you were. She verbalized understanding and was just glad she was taken care of. She said she hasn't spoken with the patient today because it was so late when they got her back to [facility] she wanted to let her sleep. She said there wasn't a way to get her here today for follow up due to transportation and being too far away. I added her on tomorrow's schedule and she said she would try to get her here, but transportation may again be an issue. She would call and let me know for sure.

## 2013-03-16 ENCOUNTER — Encounter: Payer: Self-pay | Admitting: Family Medicine

## 2013-03-16 ENCOUNTER — Ambulatory Visit (INDEPENDENT_AMBULATORY_CARE_PROVIDER_SITE_OTHER): Payer: Medicare Other | Admitting: Family Medicine

## 2013-03-16 VITALS — BP 124/72 | HR 70 | Temp 98.2°F | Wt 143.2 lb

## 2013-03-16 DIAGNOSIS — J069 Acute upper respiratory infection, unspecified: Secondary | ICD-10-CM | POA: Insufficient documentation

## 2013-03-16 MED ORDER — AZITHROMYCIN 250 MG PO TABS: ORAL_TABLET | ORAL | Status: DC

## 2013-03-16 NOTE — Progress Notes (Signed)
Pre-visit discussion using our clinic review tool. No additional management support is needed unless otherwise documented below in the visit note.  

## 2013-03-16 NOTE — Patient Instructions (Signed)
I do want to treat you aggressively with antibiotic for current chest cold. I would recommend calling [name] to keep her informed if there's a planned trip to the ER.

## 2013-03-16 NOTE — Assessment & Plan Note (Signed)
Anticipate sxs precipitated by recent upper resp infection.  Cardiac status stable currently. Will treat aggressively with azithromycin. Did discuss would want to touch base with myself or with daughter [name] if any further planned intervention or trip to hospital.  Pt agrees.

## 2013-03-16 NOTE — Progress Notes (Signed)
BP 124/72  Pulse 70  Temp(Src) 98.2 F (36.8 C) (Oral)  Wt 143 lb 4 oz (64.978 kg)  SpO2 98%   CC: ER f/u  Subjective:    Patient ID: Christine Blanchard, female    DOB: 1928-09-21, 80 y.o.   MRN: [id number]  HPI: Christine Blanchard is a 80 y.o. female presenting on 03/16/2013 with Follow-up  Recently sent to ER 2/2 chest pain and dyspnea with cough.  I was not in office at that time.  ER records reviewed.  CBC, BMP, TnI x2 neg, and CXR ok.  UA likely contaminant. EKG unchanged with diffuse T wave flattening.    Sxs that led her to ER were increased fatigue, dyspnea, and left sided chest discomfort.  Pt endorses increased hoarseness over last few days and attributes sxs to chest cold.  This has been ongoing for last 4 days, started as chest cold, and now with sore throat.  Tmax 99.9.  + head congestion and cough this week that started to be productive last night.  Decreased appetite with some early satiety ongoing for last few months.  No dysphagia.  Increase anxiety recently - on celexa 33m daily. Pt thinks BMirantwith sending her to ER. She did have some increased pedal edema recently. Pt weighs daily, watches salt intake.  Weight very stable. Wt Readings from Last 3 Encounters:  03/16/13 143 lb 4 oz (64.978 kg)  01/18/13 142 lb 4 oz (64.524 kg)  12/27/12 142 lb 8 oz (64.638 kg)    Past Medical History  Diagnosis Date  . Hypertension   . Diabetes mellitus 2003    previously saw Dr. AElyse Hsu . Hyperlipidemia   . History of chicken pox     has had shingles shot  . History of diverticulitis of colon 1991, 1992, 1994  . History of kidney stones 1952  . CVA (cerebral infarction)     after a stent, minimal R residual weakness, affects speech when tired, ?mild dysequilibrium  . Urinary incontinence     stress/urge, per D. Tannenbaum/Lomax  . CAD (coronary artery disease)     severe, s/p 4 cardiac caths, minimally invasive CABG @[facility] , ICA stent 2004 then LAD stent  2005, MEDICAL MANAGEMENT ONLY, MAY USE REPEATED SL NITRO  . Breathing difficulty 2011    on O2 since 10/2009  . Syncope 2005    s/p w/u including CT scan and 24 hour Holter  . GERD (gastroesophageal reflux disease) 2004  . Macular degeneration     s/p corneal transplants  . Benign essential tremor     toprol XL and remeron  . Herpes zoster ophthalmicus 2009    history  . Hearing loss of both ears 08/2010    audiology eval 08/2010 - mod sensorineural heaing loss, rec trial digital hearing aids and re eval yearly to monitor  . Vitamin D deficiency     on 50k units weekly  . Diastolic CHF 27/4128   grade 1, normal EF, mildly dilated LA, normal PA pressures  . Lumbar spinal stenosis     with severe spondylosis, s/p laminotomy  . Allergic rhinitis   . Chronic kidney disease     stage 3 kidney disease.  . Leg weakness, bilateral 2014    s/p neuro eval - thought multifactorial (diabetic neuropathy, lumbar stenosis, and chronic disease deconditioning).  Conservative management recommended  . Gait instability      Relevant past medical, surgical, family and social history reviewed and updated.  Allergies and medications reviewed and updated. Current Outpatient Prescriptions on File Prior to Visit  Medication Sig  . ACCU-CHEK SOFTCLIX LANCETS lancets CHECK BLOOD SUGAR THREE TIMES A DAY AND AS DIRECTED  . aspirin EC 81 MG tablet Take 2 tablets (162 mg total) by mouth daily.  Marland Kitchen atorvastatin (LIPITOR) 20 MG tablet TAKE ONE TABLET BY MOUTH EACH DAY.  Marland Kitchen Blood Glucose Monitoring Suppl (ACCU-CHEK AVIVA PLUS) W/DEVICE KIT Check blood sugar three times a day and as directed.250.00, insulin dependent  . cetirizine (ZYRTEC ALLERGY) 10 MG tablet Take 1 tablet (10 mg total) by mouth daily.  . citalopram (CELEXA) 20 MG tablet TAKE 1 TABLET DAILY  . clopidogrel (PLAVIX) 75 MG tablet Take 1 tablet (75 mg total) by mouth daily.  . fenofibrate micronized (ANTARA) 130 MG capsule TAKE 1 CAPSULE DAILY  .  fluticasone (FLONASE) 50 MCG/ACT nasal spray Place 2 sprays into the nose daily.  . furosemide (LASIX) 40 MG tablet Take 40 mg by mouth daily.  Marland Kitchen gabapentin (NEURONTIN) 100 MG capsule TAKE 1 CAPSULE TWICE A DAY  . glucose blood (ACCU-CHEK AVIVA) test strip Use as instructed  . Insulin Pen Needle (B-D ULTRAFINE III SHORT PEN) 31G X 8 MM MISC Use as directed  . isosorbide mononitrate (ISMO,MONOKET) 20 MG tablet TAKE 1/2 TABLET (=10MG) BY MOUTH TWICE A DAY  . JANUVIA 100 MG tablet TAKE 1 TABLET (100 MG TOTAL) DAILY  . LORazepam (ATIVAN) 1 MG tablet Take 1 tablet (1 mg total) by mouth at bedtime. insomnia  . metFORMIN (GLUCOPHAGE-XR) 500 MG 24 hr tablet TAKE 2 TABLETS (1,000 MG TOTAL) AT BEDTIME  . Multiple Vitamin (THEREMS) TABS TAKE ONE TABLET BY MOUTH EACH DAY.  . nitroGLYCERIN (NITROSTAT) 0.4 MG SL tablet Place 1 tablet (0.4 mg total) under the tongue as needed.  . potassium chloride (KLOR-CON M10) 10 MEQ tablet TAKE 1 TABLET (10 MEQ TOTAL) BY MOUTH DAILY.  Marland Kitchen prednisoLONE sodium phosphate (INFLAMASE FORTE) 1 % ophthalmic solution Place 1 drop into both eyes daily.   Marland Kitchen RANEXA 1000 MG SR tablet TAKE 1 TABLET TWICE A DAY  . Vitamin D, Ergocalciferol, (DRISDOL) 50000 UNITS CAPS TAKE 1 CAPSULE BY MOUTH ONCE A WEEK  . ZETIA 10 MG tablet TAKE 1 TABLET DAILY  . Casanthranol-Docusate Sodium 30-100 MG CAPS as needed.    . metoprolol tartrate (LOPRESSOR) 25 MG tablet Take 1 tablet (25 mg total) by mouth 2 (two) times daily.  . Multiple Vitamin (MULTIVITAMIN) capsule Take 1 capsule by mouth daily.     No current facility-administered medications on file prior to visit.    Review of Systems Per HPI unless specifically indicated above    Objective:    BP 124/72  Pulse 70  Temp(Src) 98.2 F (36.8 C) (Oral)  Wt 143 lb 4 oz (64.978 kg)  SpO2 98%  Physical Exam  Nursing note and vitals reviewed. Constitutional: She appears well-developed and well-nourished. No distress.  2 L [place] today  HENT:    Mouth/Throat: Oropharynx is clear and moist.  Eyes: Conjunctivae and EOM are normal. Pupils are equal, round, and reactive to light.  Cardiovascular: Normal rate, regular rhythm, normal heart sounds and intact distal pulses.   No murmur heard. Pulmonary/Chest: Effort normal and breath sounds normal. No respiratory distress. She has no wheezes. She has no rales.  Slightly coarse bibasilarly  Musculoskeletal: She exhibits edema (tr pedal L>R).       Assessment & Plan:   Problem List Items Addressed This Visit  Acute upper respiratory infections of unspecified site - Primary     Anticipate sxs precipitated by recent upper resp infection.  Cardiac status stable currently. Will treat aggressively with azithromycin. Did discuss would want to touch base with myself or with daughter [name] if any further planned intervention or trip to hospital.  Pt agrees.    Relevant Medications      azithromycin (ZITHROMAX) tablet       Follow up plan: Return if symptoms worsen or fail to improve.

## 2013-03-19 ENCOUNTER — Other Ambulatory Visit: Payer: Self-pay

## 2013-03-19 ENCOUNTER — Other Ambulatory Visit: Payer: Self-pay | Admitting: Family Medicine

## 2013-03-19 MED ORDER — CETIRIZINE HCL 10 MG PO TABS: 10.0000 mg | ORAL_TABLET | Freq: Every day | ORAL | Status: DC

## 2013-03-21 ENCOUNTER — Ambulatory Visit: Payer: Medicare Other

## 2013-03-25 ENCOUNTER — Other Ambulatory Visit: Payer: Self-pay | Admitting: Family Medicine

## 2013-03-27 ENCOUNTER — Encounter: Payer: Self-pay | Admitting: *Deleted

## 2013-03-28 ENCOUNTER — Encounter: Payer: Self-pay | Admitting: Family Medicine

## 2013-04-02 ENCOUNTER — Other Ambulatory Visit: Payer: Self-pay | Admitting: Family Medicine

## 2013-04-02 ENCOUNTER — Telehealth: Payer: Self-pay

## 2013-04-02 NOTE — Telephone Encounter (Signed)
Left detailed message on voicemail of contact number listed and note forwarded to Kim to assure follow up.

## 2013-04-02 NOTE — Telephone Encounter (Signed)
Pt left v/m that pt had sent a mychart message to Dr Darnell Level (pt email on 03/28/13). Pt request mychart question answered.Please advise.

## 2013-04-02 NOTE — Telephone Encounter (Signed)
Please see this Mychart message. It was sent to Dr. Darnell Level on Friday when he wasn't here and he hasn't looked at it. The patient wants a repsone and I think needs one today.

## 2013-04-02 NOTE — Telephone Encounter (Signed)
Responded to patient. This was sent to Dr. Darnell Level on Friday when he was out of the country. He did not look at her message. I have sent it to Dr. Damita Dunnings for review.

## 2013-04-02 NOTE — Telephone Encounter (Signed)
Please call her.  Okay to hold lasix tonight and then add her on tomorrow if needed.  Thanks.

## 2013-04-03 NOTE — Telephone Encounter (Signed)
Incorrect phone number was listed in patient's chart.  Patient advised of instructions but patient says that she is much better now.  She had the stomach bug and now is much better and feeling well.  She is not concerned about dehydration.

## 2013-04-03 NOTE — Telephone Encounter (Signed)
Noted, thanks.  Routed to PCP.

## 2013-04-06 ENCOUNTER — Other Ambulatory Visit: Payer: Self-pay | Admitting: Family Medicine

## 2013-04-25 ENCOUNTER — Ambulatory Visit (INDEPENDENT_AMBULATORY_CARE_PROVIDER_SITE_OTHER): Payer: Medicare Other | Admitting: Pulmonary Disease

## 2013-04-25 ENCOUNTER — Ambulatory Visit
Admission: RE | Admit: 2013-04-25 | Discharge: 2013-04-25 | Disposition: A | Discharge status: Home / Self Care | Payer: Medicare Other | Source: Ambulatory Visit | Attending: Pulmonary Disease | Admitting: Pulmonary Disease

## 2013-04-25 ENCOUNTER — Encounter: Payer: Self-pay | Admitting: Pulmonary Disease

## 2013-04-25 VITALS — BP 118/64 | HR 75 | Ht 65.0 in | Wt 141.0 lb

## 2013-04-25 DIAGNOSIS — J9610 Chronic respiratory failure, unspecified whether with hypoxia or hypercapnia: Secondary | ICD-10-CM | POA: Diagnosis not present

## 2013-04-25 DIAGNOSIS — R0902 Hypoxemia: Secondary | ICD-10-CM

## 2013-04-25 DIAGNOSIS — R0602 Shortness of breath: Secondary | ICD-10-CM | POA: Diagnosis not present

## 2013-04-25 DIAGNOSIS — J9611 Chronic respiratory failure with hypoxia: Secondary | ICD-10-CM

## 2013-04-25 NOTE — Assessment & Plan Note (Signed)
She continues to use and benefit from her oxygen. We measured ambulatory oxygen saturation today on 2 L and that is adequate to maintain her oxygen saturation in the normal range.

## 2013-04-25 NOTE — Assessment & Plan Note (Signed)
As noted prior, this is a multifactorial problem. I do question recurrent aspiration. Because of the crackles on exam and increasing dyspnea  I will check a chest x-ray. However, considering that her oxygenation has not changed in the 18 months I have known her I suspect that the shortness of breath is coming more from deconditioning than a pulmonary problem.

## 2013-04-25 NOTE — Patient Instructions (Signed)
Keep using your oxygen as you are doing We will arrange a chest x-ray in 2 weeks with your blood work at Dr. Synthia Innocent office Follow up with [place] in 6 months

## 2013-04-25 NOTE — Progress Notes (Signed)
Subjective:    Patient ID: Christine Blanchard, female    DOB: 1928/07/08, 80 y.o.   MRN: [id number]  Synopsis: 80 y/o female with coronary artery disease and chronic angina who has been followed by [facility] in [facility] (by Dr. Lamonte Sakai) for years and [facility] in [place] since 02/2011 for chronic dyspnea and hypoxemic respiratory failure.  Objectively she has both restrictive and obstructive disease and low MIP and MEP. She also likely has some degree of chronic aspiration.  She has severe coronary artery disease with chronic angina.  HPI 03/10/11 ROV -- Christine Blanchard reports to me that she is doing "great". She states that she just finished two months of physical therapy and feels better after doing so.  Her shortness of breath has been manageable lately.  She still notices that she chokes on some food, specifically "crumbly" foods like popcorn and cornbread.  Swelling has improved some.    06/14/11 ROV-- Christine Blanchard says that she likes pulmonary rehab and is able to exercise more than when she started.  She says that she really likes the staff there and says they are pushing her more. She feels physically stronger but is tired at home afterwards.  Her chronic angina is still limiting her somewhat.  She has days where she takes multiple nitroglyerin tabs.  She has not had fever, chills, or cough.  She is noting difficulty with sleeping.  Specifically she can't fall asleep after lying down until about 3AM.  She does not use caffeinated beverages other than some tea with lunch.  Her swelling has improved on her current dose of lasix.  11/03/11 ROV -- Christine Blanchard states that she had some breathing trouble a few weeks back when the weather was hot. Specifically she had itching eyes and sinus congestion. She took Zyrtec in addition to the Claritin and said that it made this better. While the sinus symptoms and itchy eyes have improved she states that she feels that she feels generally weaker than when I last saw her.  Specifically she says she has difficulty with activities of daily living around the house such as making her bed or bringing in groceries. She does not have a new cough. She does have dyspnea when performing these activities and associated chest pain. She states that her angina has been more frequent and longer lasting. She has been advised by Dr. Rockey Situ to continue taking nitrates for pain. She has not been swelling in her legs more.  02/08/2012 ROV -- Christine Blanchard was recently admitted to [facility] for a CHF exacerbation. It sounds like she stayed in the hospital for about 5 or 6 days and required IV diuretics. She had a stress test which was negative during that hospitalization but she states she has had multiple nuclear, chemical stress test in the past which have been negative despite her known severe coronary disease. She's had multiple sick family members lately including a son who has H1 N1 influenza. She was unable to see them around the holidays because of this. Since discharge from the hospital her leg swelling, and shortness of breath are much better. She is still having some frustration with her sinus medications in that in the evenings she feels that the Claritin "runs out".  06/13/12 ROV -- Christine Blanchard says that she has been quite fatigued lately. She takes an Ativan at night and sleeps well and wakes up feeling fairly well rested. However shortly after breakfast she starts feeling significant fatigue. She does not feel particularly sleepy and  she does not nap during the day. However she states that her energy is much worse than previously. Her cough is stable. Her sinus problems are well controlled on Zyrtec. She states that she has been having increasing swelling in her abdomen lately.  She is supposed to start taking Lasix daily as of yesterday.  10/17/2012 ROV >> Christine Blanchard has been doing fairly well from a respiratory standpoint since the last visit. Unfortunately she has had multiple falls at home. She had 2  in the last week. This was not related to her respiratory problem. She still has some sinus congestion and drainage in the evenings. This is usually worse after she lies down. She takes her Zyrtec in the morning. She is using saline sprays some. She continues to use her nasal steroid. She has been seen by a neurologist recently who feels that she has a multifactorial neuropathy which is leading to her falls. Her cough may have worsened slightly in the last few weeks it has been nonproductive. She questions whether or not this related increasing sinus congestion.  04/25/2013 ROV >> Christine Blanchard moved in November to [place] assisted living facility.  She has begrudgingly moved. She's not too happy with the food, but overall she thinks it was the right decision.   She has been to neurology for evaluation of her spinal stenosis and her intermittent weakness.  She feels like she is generally weaker and has worse days than other. She gets out of breath more with walking.  She has been experiencing more allergy symptoms lately with sinus congestion.  She does not cough.  She recently had to complete a Zpack for a respiratory infection.  Past Medical History  Diagnosis Date  . Hypertension   . Diabetes mellitus 2003    previously saw Dr. Elyse Hsu  . Hyperlipidemia   . History of chicken pox     has had shingles shot  . History of diverticulitis of colon 1991, 1992, 1994  . History of kidney stones 1952  . CVA (cerebral infarction)     after a stent, minimal R residual weakness, affects speech when tired, ?mild dysequilibrium  . Urinary incontinence     stress/urge, per D. Tannenbaum/Lomax  . CAD (coronary artery disease)     severe, s/p 4 cardiac caths, minimally invasive CABG _0 , ICA stent 2004 then LAD stent 2005, MEDICAL MANAGEMENT ONLY, MAY USE REPEATED SL NITRO  . Breathing difficulty 2011    on O2 since 10/2009  . Syncope 2005    s/p w/u including CT scan and 24 hour Holter  . GERD  (gastroesophageal reflux disease) 2004  . Macular degeneration     s/p corneal transplants  . Benign essential tremor     toprol XL and remeron  . Herpes zoster ophthalmicus 2009    history  . Hearing loss of both ears 08/2010    audiology eval 08/2010 - mod sensorineural heaing loss, rec trial digital hearing aids and re eval yearly to monitor  . Vitamin D deficiency     on 50k units weekly  . Diastolic CHF 05/979    grade 1, normal EF, mildly dilated LA, normal PA pressures  . Lumbar spinal stenosis     with severe spondylosis, s/p laminotomy  . Allergic rhinitis   . Chronic kidney disease     stage 3 kidney disease.  . Leg weakness, bilateral 2014    s/p neuro eval - thought multifactorial (diabetic neuropathy, lumbar stenosis, and chronic disease deconditioning).  Conservative  management recommended  . Gait instability      Review of Systems  Constitutional: Negative for fever, chills and unexpected weight change.  Respiratory: Positive for shortness of breath. Negative for cough and choking.   Cardiovascular: Negative for chest pain and leg swelling.       Objective:   Physical Exam   Filed Vitals:   04/25/13 1025  BP: 118/64  Pulse: 75  Height: 5' 5" (1.651 m)  Weight: 141 lb (63.957 kg)  SpO2: 96%  2L [place]  04/25/2013 Ambulated 577fet on 2L [place] and O2 remained >94%  Gen: well appearing, no acute distress HEENT: NCAT, EOMi, OP clear, PULM: crackles in bases, about 1/3 up bilaterally CV: RRR, slight systolic murmur, no JVD AB: ND, NT Ext: warm, trace ankle edema, no clubbing, no cyanosis   2011 PFT's: FVC Value: 1.56 L/min Pred: 2.64 L/min % Pred: 59 %  FEV1 Value: 1.28 L Pred: 1.96 L % Pred: 65 %  FEV1/FVC Value: 82 % Pred: 74 % % Pred: 111 %  FEF 25-75 Value: 1.33 L/min Pred: 1.44 L/min % Pred: 92 %  MIP (-224mg) 40% pred, MEP 46% pred  11/2010 CTA cardiac (with lung windows, my read): R base peripheral/subpleural scarring, scattered GGO greatest in  left lower     Assessment & Plan:   Chronic hypoxemic respiratory failure She continues to use and benefit from her oxygen. We measured ambulatory oxygen saturation today on 2 L and that is adequate to maintain her oxygen saturation in the normal range.  DYSPNEA As noted prior, this is a multifactorial problem. I do question recurrent aspiration. Because of the crackles on exam and increasing dyspnea  I will check a chest x-ray. However, considering that her oxygenation has not changed in the 18 months I have known her I suspect that the shortness of breath is coming more from deconditioning than a pulmonary problem.    Updated Medication List Outpatient Encounter Prescriptions as of 04/25/2013  Medication Sig  . ACCU-CHEK SOFTCLIX LANCETS lancets CHECK BLOOD SUGAR THREE TIMES A DAY AND AS DIRECTED  . aspirin EC 81 MG tablet Take 2 tablets (162 mg total) by mouth daily.  . Marland Kitchentorvastatin (LIPITOR) 20 MG tablet TAKE 1 TABLET NIGHTLY  . Blood Glucose Monitoring Suppl (ACCU-CHEK AVIVA PLUS) W/DEVICE KIT Check blood sugar three times a day and as directed.250.00, insulin dependent  . Casanthranol-Docusate Sodium 30-100 MG CAPS as needed.    . cetirizine (ZYRTEC ALLERGY) 10 MG tablet Take 1 tablet (10 mg total) by mouth daily.  . citalopram (CELEXA) 20 MG tablet TAKE 1 TABLET DAILY  . clopidogrel (PLAVIX) 75 MG tablet Take 1 tablet (75 mg total) by mouth daily.  . fenofibrate micronized (ANTARA) 130 MG capsule TAKE 1 CAPSULE DAILY  . fluticasone (FLONASE) 50 MCG/ACT nasal spray Place 2 sprays into the nose daily.  . furosemide (LASIX) 40 MG tablet Take 40 mg by mouth daily.  . Marland Kitchenabapentin (NEURONTIN) 100 MG capsule TAKE 1 CAPSULE TWICE A DAY  . glucose blood (ACCU-CHEK AVIVA) test strip Use as instructed  . Insulin Glargine (LANTUS SOLOSTAR) 100 UNIT/ML Solostar Pen INJECT 16 UNITS (0.16ML) UNDER THE SKIN DAILY  . Insulin Pen Needle (B-D ULTRAFINE III SHORT PEN) 31G X 8 MM MISC Use as directed   . isosorbide mononitrate (ISMO,MONOKET) 20 MG tablet TAKE 1/2 TABLET (=10MG) BY MOUTH TWICE A DAY  . JANUVIA 100 MG tablet TAKE 1 TABLET DAILY  . LORazepam (ATIVAN) 1 MG tablet Take 1 tablet (  1 mg total) by mouth at bedtime. insomnia  . metFORMIN (GLUCOPHAGE-XR) 500 MG 24 hr tablet TAKE 2 TABLETS (1,000 MG TOTAL) AT BEDTIME  . metoprolol tartrate (LOPRESSOR) 25 MG tablet Take 1 tablet (25 mg total) by mouth 2 (two) times daily.  . Multiple Vitamin (MULTIVITAMIN) capsule Take 1 capsule by mouth daily.    . Multiple Vitamin (THEREMS) TABS TAKE ONE TABLET BY MOUTH EACH DAY.  . nitroGLYCERIN (NITROSTAT) 0.4 MG SL tablet Place 1 tablet (0.4 mg total) under the tongue as needed.  . potassium chloride (KLOR-CON M10) 10 MEQ tablet TAKE 1 TABLET (10 MEQ TOTAL) BY MOUTH DAILY.  Marland Kitchen prednisoLONE sodium phosphate (INFLAMASE FORTE) 1 % ophthalmic solution Place 1 drop into both eyes daily.   . RABEprazole (ACIPHEX) 20 MG tablet TAKE (1) TABLET BY MOUTH DAILY.DO NOT CRUSH  . RANEXA 1000 MG SR tablet TAKE 1 TABLET TWICE A DAY  . Vitamin D, Ergocalciferol, (DRISDOL) 50000 UNITS CAPS capsule TAKE 1 CAPSULE ONCE A WEEK  . ZETIA 10 MG tablet TAKE 1 TABLET DAILY  . [DISCONTINUED] azithromycin (ZITHROMAX Z-PAK) 250 MG tablet Two on day 1 followed by one daily for 4 days for total of 5 days, PO

## 2013-05-05 ENCOUNTER — Other Ambulatory Visit: Payer: Self-pay | Admitting: Family Medicine

## 2013-05-05 DIAGNOSIS — N183 Chronic kidney disease, stage 3 unspecified: Secondary | ICD-10-CM

## 2013-05-05 DIAGNOSIS — E1121 Type 2 diabetes mellitus with diabetic nephropathy: Secondary | ICD-10-CM

## 2013-05-05 DIAGNOSIS — E538 Deficiency of other specified B group vitamins: Secondary | ICD-10-CM

## 2013-05-05 DIAGNOSIS — E559 Vitamin D deficiency, unspecified: Secondary | ICD-10-CM

## 2013-05-05 DIAGNOSIS — E785 Hyperlipidemia, unspecified: Secondary | ICD-10-CM

## 2013-05-05 DIAGNOSIS — I10 Essential (primary) hypertension: Secondary | ICD-10-CM

## 2013-05-07 ENCOUNTER — Encounter: Payer: Self-pay | Admitting: Family Medicine

## 2013-05-07 NOTE — Telephone Encounter (Signed)
plz call pt - ok to schedule appt 4/13, but if any redness around cut or not healing well, or any worsening swelling recommend seen earlier this week.

## 2013-05-08 NOTE — Telephone Encounter (Signed)
Called pt and offered appt on 4/13, pt declined to schedule appt. Pt said that her foot isn't swollen, red or painful and the cut is little. I advise pt that if any redness, pain, or swelling or worsening sxs to call and schedule an appt., pt verbalized understanding

## 2013-05-09 ENCOUNTER — Ambulatory Visit (INDEPENDENT_AMBULATORY_CARE_PROVIDER_SITE_OTHER): Payer: Medicare Other | Admitting: Family Medicine

## 2013-05-09 ENCOUNTER — Encounter: Payer: Self-pay | Admitting: Family Medicine

## 2013-05-09 VITALS — BP 100/70 | HR 72 | Temp 98.0°F | Ht 65.0 in | Wt 141.2 lb

## 2013-05-09 DIAGNOSIS — E11621 Type 2 diabetes mellitus with foot ulcer: Secondary | ICD-10-CM

## 2013-05-09 DIAGNOSIS — I6350 Cerebral infarction due to unspecified occlusion or stenosis of unspecified cerebral artery: Secondary | ICD-10-CM | POA: Diagnosis not present

## 2013-05-09 DIAGNOSIS — R609 Edema, unspecified: Secondary | ICD-10-CM

## 2013-05-09 DIAGNOSIS — L97509 Non-pressure chronic ulcer of other part of unspecified foot with unspecified severity: Secondary | ICD-10-CM | POA: Diagnosis not present

## 2013-05-09 DIAGNOSIS — I25810 Atherosclerosis of coronary artery bypass graft(s) without angina pectoris: Secondary | ICD-10-CM

## 2013-05-09 DIAGNOSIS — I5032 Chronic diastolic (congestive) heart failure: Secondary | ICD-10-CM

## 2013-05-09 DIAGNOSIS — L97519 Non-pressure chronic ulcer of other part of right foot with unspecified severity: Principal | ICD-10-CM

## 2013-05-09 DIAGNOSIS — N058 Unspecified nephritic syndrome with other morphologic changes: Secondary | ICD-10-CM

## 2013-05-09 DIAGNOSIS — E1121 Type 2 diabetes mellitus with diabetic nephropathy: Secondary | ICD-10-CM

## 2013-05-09 DIAGNOSIS — E1169 Type 2 diabetes mellitus with other specified complication: Secondary | ICD-10-CM

## 2013-05-09 DIAGNOSIS — E1129 Type 2 diabetes mellitus with other diabetic kidney complication: Secondary | ICD-10-CM | POA: Diagnosis not present

## 2013-05-09 DIAGNOSIS — I509 Heart failure, unspecified: Secondary | ICD-10-CM

## 2013-05-09 DIAGNOSIS — R600 Localized edema: Secondary | ICD-10-CM

## 2013-05-09 NOTE — Progress Notes (Signed)
Pre visit review using our clinic review tool, if applicable. No additional management support is needed unless otherwise documented below in the visit note. 

## 2013-05-09 NOTE — Progress Notes (Signed)
Date:  05/09/2013   Name:  Christine Blanchard   DOB:  05/27/1928   MRN:  [id number]  Primary Physician:  Ria Bush, MD   Chief Complaint: Cut on Foot and Foot Swelling   Subjective:   History of Present Illness:  Christine Blanchard is a 80 y.o. pleasant patient who presents with the following:  Pleasant patient with multiple medical problems including DM under good control, CAD s/p CABG, CVA, chronic respiratory failure, CHF presents with a cut on the dorsum of her R 1st MTP that has progressed to the point of being a shallow ulcer. There is no surrounding redness, and it does not particularly hurt all that much. No known LE arterial disease.   Lab Results  Component Value Date   HGBA1C 6.2 01/15/2013    She also is worried about her swelling and ? If she has any current pulmonary edema.   Patient Active Problem List   Diagnosis Date Noted  . CKD (chronic kidney disease) stage 3, GFR 30-59 ml/min 01/18/2013  . CAD (coronary artery disease) of artery bypass graft 12/27/2012  . Head injury, unspecified 11/20/2012  . Nausea 08/21/2012  . Weakness of both legs 06/25/2012  . Asymptomatic bacteriuria 02/07/2012  . Chronic diastolic CHF (congestive heart failure) 12/17/2011  . Insomnia 06/25/2011  . Chronic hypoxemic respiratory failure 03/10/2011  . Depression 02/05/2011  . Benign essential tremor 02/05/2011  . Dysphagia 01/14/2011  . Gait instability 12/16/2010  . Vitamin D deficiency   . Healthcare maintenance 08/04/2010  . Hearing impairment 08/04/2010  . Allergic rhinitis, seasonal 06/11/2010  . HYPERLIPIDEMIA-MIXED 03/02/2010  . CAD 10/30/2009  . CVA 10/30/2009  . Well controlled type 2 diabetes mellitus with nephropathy 10/03/2009  . HYPERTENSION 10/03/2009  . DYSPNEA 10/03/2009    Past Medical History  Diagnosis Date  . Hypertension   . Diabetes mellitus 2003    previously saw Dr. Elyse Hsu  . Hyperlipidemia   . History of chicken pox     has had shingles shot   . History of diverticulitis of colon 1991, 1992, 1994  . History of kidney stones 1952  . CVA (cerebral infarction)     after a stent, minimal R residual weakness, affects speech when tired, ?mild dysequilibrium  . Urinary incontinence     stress/urge, per D. Tannenbaum/[place]  . CAD (coronary artery disease)     severe, s/p 4 cardiac caths, minimally invasive CABG @[facility] , ICA stent 2004 then LAD stent 2005, MEDICAL MANAGEMENT ONLY, MAY USE REPEATED SL NITRO  . Breathing difficulty 2011    on O2 since 10/2009  . Syncope 2005    s/p w/u including CT scan and 24 hour Holter  . GERD (gastroesophageal reflux disease) 2004  . Macular degeneration     s/p corneal transplants  . Benign essential tremor     toprol XL and remeron  . Herpes zoster ophthalmicus 2009    history  . Hearing loss of both ears 08/2010    audiology eval 08/2010 - mod sensorineural heaing loss, rec trial digital hearing aids and re eval yearly to monitor  . Vitamin D deficiency     on 50k units weekly  . Diastolic CHF 06/3297    grade 1, normal EF, mildly dilated LA, normal PA pressures  . Lumbar spinal stenosis     with severe spondylosis, s/p laminotomy  . Allergic rhinitis   . Chronic kidney disease     stage 3 kidney disease.  . Leg weakness, bilateral  2014    s/p neuro eval - thought multifactorial (diabetic neuropathy, lumbar stenosis, and chronic disease deconditioning).  Conservative management recommended  . Gait instability     Past Surgical History  Procedure Laterality Date  . Cholecystectomy  1985  . Cervical fusion  2002    anterior decompression and fusion C6/7  . Coronary artery bypass graft  2004  . Percutaneous coronary stent intervention (pci-s)  2005    stents x 2 (intermediate and LAD), 2nd complicated by stroke  . Corneal transplant      x3  . Appendectomy  1948  . Breast biopsy  2001    x2, both benign, last mammo 2011, last pap smear 2011  . Cardiac catheterization  2009    x5  .  Cataract extraction      bilateral, corneal dystrophy  . Cardiovascular stress test  2010    nuclear, normal  . Dexa  2005    normal  . Lumbar spine surgery  2010    laminotomy (L2/3, 3/4)    History   Social History  . Marital Status: Widowed    Spouse Name: N/A    Number of Children: 2  . Years of Education: college   Occupational History  .     Social History Main Topics  . Smoking status: Never Smoker   . Smokeless tobacco: Never Used  . Alcohol Use: No  . Drug Use: No  . Sexual Activity: Not on file   Other Topics Concern  . Not on file   Social History Narrative   Lives alone in independent retirement community, [facility] [place].  No pets   Has lifeline.   Daughter nearby [facility]) is CHF Therapist, sports at [employer] nearby [place])    Family History  Problem Relation Age of Onset  . Cancer Mother     colon  . Heart disease Father   . Cancer Brother     bladder    Allergies  Allergen Reactions  . Actos [Pioglitazone Hydrochloride] Shortness Of Breath    CHF  . Atenolol Other (See Comments)    lethargy  . Codeine     REACTION: vomiting  . Eszopiclone Other (See Comments)    Nightmares  . Exenatide Nausea Only  . Morphine     REACTION: nausea  . Sulfa Antibiotics   . Vicodin [Hydrocodone-Acetaminophen] Nausea And Vomiting    Medication list has been reviewed and updated.  Review of Systems:   GEN: No acute illnesses, no fevers, chills. GI: No n/v/d, eating normally Pulm: No SOB Interactive and getting along well at home.  Otherwise, ROS is as per the HPI.  Objective:   Physical Examination: BP 100/70  Pulse 72  Temp(Src) 98 F (36.7 C) (Oral)  Ht 5' 5"  (1.651 m)  Wt 141 lb 4 oz (64.071 kg)  BMI 23.51 kg/m2  SpO2 93%  Ideal Body Weight: Weight in (lb) to have BMI = 25: 149.9   GEN: WDWN, NAD, Non-toxic, A & O x 3 HEENT: Atraumatic, Normocephalic. Neck supple. No masses, No LAD. Ears and Nose: No external deformity. CV:  RRR, No M/G/R. No JVD. No thrill. No extra heart sounds. PULM: CTA B, no wheezes, crackles, rhonchi. No retractions. No resp. distress. No accessory muscle use. EXTR: 1+ LE edema NEURO Normal gait.  PSYCH: Normally interactive. Conversant. Not depressed or anxious appearing.  Calm demeanor.  SKIN: ulcerated lesion approximately the size of a dime on the dorsum of the  1st MTP on the right  Laboratory and Imaging Data: Recent Results (from the past 2160 hour(s))  CBC     Status: None   Collection Time    03/14/13 12:00 AM      Result Value Ref Range   WBC 7.2     HGB 12.4     platelet count 205    COMPREHENSIVE METABOLIC PANEL     Status: None   Collection Time    03/14/13 12:00 AM      Result Value Ref Range   Creat 1.07     Sodium 138  137 - 147 mmol/L   Potassium 3.7       Assessment & Plan:   Diabetic ulcer of right foot  Well controlled type 2 diabetes mellitus with nephropathy  CAD (coronary artery disease) of artery bypass graft  CVA  Chronic diastolic CHF (congestive heart failure)  Lower extremity edema  Discussed case with Dr. Darnell Level who will recheck next week.   I will have [name] assist with dressing changes, vaseline to wound with gauze dressing BID.  No pulmonary edema, mild LE edema. In frail 80 yo, I would make no changes.  Follow-up: Return in about 1 week (around 05/16/2013) for wound recheck, Dr. Danise Mina.  Signed,  Maud Deed. Daelyn Mozer, MD, [place] at [facility] [place] [address] Phone: [phone] Fax: [phone]  Patient's Medications  New Prescriptions   No medications on file  Previous Medications   ACCU-CHEK SOFTCLIX LANCETS LANCETS    CHECK BLOOD SUGAR THREE TIMES A DAY AND AS DIRECTED   ASPIRIN EC 81 MG TABLET    Take 2 tablets (162 mg total) by mouth daily.   ATORVASTATIN (LIPITOR) 20 MG TABLET    TAKE 1 TABLET NIGHTLY   BLOOD GLUCOSE MONITORING SUPPL (ACCU-CHEK AVIVA PLUS)  W/DEVICE KIT    Check blood sugar three times a day and as directed.250.00, insulin dependent   CASANTHRANOL-DOCUSATE SODIUM 30-100 MG CAPS    as needed.     CETIRIZINE (ZYRTEC ALLERGY) 10 MG TABLET    Take 1 tablet (10 mg total) by mouth daily.   CITALOPRAM (CELEXA) 20 MG TABLET    TAKE 1 TABLET DAILY   CLOPIDOGREL (PLAVIX) 75 MG TABLET    Take 1 tablet (75 mg total) by mouth daily.   FENOFIBRATE MICRONIZED (ANTARA) 130 MG CAPSULE    TAKE 1 CAPSULE DAILY   FLUTICASONE (FLONASE) 50 MCG/ACT NASAL SPRAY    Place 2 sprays into the nose daily.   FUROSEMIDE (LASIX) 40 MG TABLET    Take 40 mg by mouth daily.   GABAPENTIN (NEURONTIN) 100 MG CAPSULE    TAKE 1 CAPSULE TWICE A DAY   GLUCOSE BLOOD (ACCU-CHEK AVIVA) TEST STRIP    Use as instructed   INSULIN GLARGINE (LANTUS SOLOSTAR) 100 UNIT/ML SOLOSTAR PEN    INJECT 16 UNITS (0.16ML) UNDER THE SKIN DAILY   INSULIN PEN NEEDLE (B-D ULTRAFINE III SHORT PEN) 31G X 8 MM MISC    Use as directed   ISOSORBIDE MONONITRATE (ISMO,MONOKET) 20 MG TABLET    TAKE 1/2 TABLET (=10MG) BY MOUTH TWICE A DAY   JANUVIA 100 MG TABLET    TAKE 1 TABLET DAILY   LORAZEPAM (ATIVAN) 1 MG TABLET    Take 1 tablet (1 mg total) by mouth at bedtime. insomnia   METFORMIN (GLUCOPHAGE-XR) 500 MG 24 HR TABLET    TAKE 2 TABLETS (1,000 MG TOTAL) AT  BEDTIME   METOPROLOL TARTRATE (LOPRESSOR) 25 MG TABLET    Take 1 tablet (25 mg total) by mouth 2 (two) times daily.   MULTIPLE VITAMIN (MULTIVITAMIN) CAPSULE    Take 1 capsule by mouth daily.     MULTIPLE VITAMIN (THEREMS) TABS    TAKE ONE TABLET BY MOUTH EACH DAY.   NITROGLYCERIN (NITROSTAT) 0.4 MG SL TABLET    Place 1 tablet (0.4 mg total) under the tongue as needed.   POTASSIUM CHLORIDE (KLOR-CON M10) 10 MEQ TABLET    TAKE 1 TABLET (10 MEQ TOTAL) BY MOUTH DAILY.   PREDNISOLONE SODIUM PHOSPHATE (INFLAMASE FORTE) 1 % OPHTHALMIC SOLUTION    Place 1 drop into both eyes daily.    RABEPRAZOLE (ACIPHEX) 20 MG TABLET    TAKE (1) TABLET BY MOUTH DAILY.DO  NOT CRUSH   RANEXA 1000 MG SR TABLET    TAKE 1 TABLET TWICE A DAY   VITAMIN D, ERGOCALCIFEROL, (DRISDOL) 50000 UNITS CAPS CAPSULE    TAKE 1 CAPSULE ONCE A WEEK   ZETIA 10 MG TABLET    TAKE 1 TABLET DAILY  Modified Medications   No medications on file  Discontinued Medications   No medications on file

## 2013-05-14 ENCOUNTER — Other Ambulatory Visit: Payer: Medicare Other

## 2013-05-15 ENCOUNTER — Telehealth: Payer: Self-pay | Admitting: Pulmonary Disease

## 2013-05-15 ENCOUNTER — Other Ambulatory Visit (INDEPENDENT_AMBULATORY_CARE_PROVIDER_SITE_OTHER): Payer: Medicare Other

## 2013-05-15 ENCOUNTER — Ambulatory Visit (INDEPENDENT_AMBULATORY_CARE_PROVIDER_SITE_OTHER): Payer: Medicare Other | Admitting: Family Medicine

## 2013-05-15 ENCOUNTER — Encounter: Payer: Self-pay | Admitting: Family Medicine

## 2013-05-15 ENCOUNTER — Other Ambulatory Visit: Payer: Self-pay | Admitting: *Deleted

## 2013-05-15 ENCOUNTER — Ambulatory Visit (INDEPENDENT_AMBULATORY_CARE_PROVIDER_SITE_OTHER)
Admission: RE | Admit: 2013-05-15 | Discharge: 2013-05-15 | Disposition: A | Discharge status: Home / Self Care | Payer: Medicare Other | Source: Ambulatory Visit | Attending: Pulmonary Disease | Admitting: Pulmonary Disease

## 2013-05-15 VITALS — BP 122/78 | HR 64 | Temp 98.2°F | Wt 143.5 lb

## 2013-05-15 DIAGNOSIS — R0602 Shortness of breath: Secondary | ICD-10-CM

## 2013-05-15 DIAGNOSIS — I10 Essential (primary) hypertension: Secondary | ICD-10-CM

## 2013-05-15 DIAGNOSIS — I6350 Cerebral infarction due to unspecified occlusion or stenosis of unspecified cerebral artery: Secondary | ICD-10-CM

## 2013-05-15 DIAGNOSIS — E1129 Type 2 diabetes mellitus with other diabetic kidney complication: Secondary | ICD-10-CM | POA: Diagnosis not present

## 2013-05-15 DIAGNOSIS — IMO0002 Reserved for concepts with insufficient information to code with codable children: Secondary | ICD-10-CM

## 2013-05-15 DIAGNOSIS — N058 Unspecified nephritic syndrome with other morphologic changes: Secondary | ICD-10-CM

## 2013-05-15 DIAGNOSIS — N183 Chronic kidney disease, stage 3 unspecified: Secondary | ICD-10-CM | POA: Diagnosis not present

## 2013-05-15 DIAGNOSIS — E538 Deficiency of other specified B group vitamins: Secondary | ICD-10-CM

## 2013-05-15 DIAGNOSIS — E785 Hyperlipidemia, unspecified: Secondary | ICD-10-CM | POA: Diagnosis not present

## 2013-05-15 DIAGNOSIS — E559 Vitamin D deficiency, unspecified: Secondary | ICD-10-CM | POA: Diagnosis not present

## 2013-05-15 DIAGNOSIS — S90821A Blister (nonthermal), right foot, initial encounter: Secondary | ICD-10-CM | POA: Insufficient documentation

## 2013-05-15 DIAGNOSIS — E1121 Type 2 diabetes mellitus with diabetic nephropathy: Secondary | ICD-10-CM

## 2013-05-15 LAB — RENAL FUNCTION PANEL
Albumin: 3.7 g/dL (ref 3.5–5.2)
BUN: 17 mg/dL (ref 6–23)
CO2: 33 meq/L — AB (ref 19–32)
Calcium: 9.6 mg/dL (ref 8.4–10.5)
Chloride: 98 mEq/L (ref 96–112)
Creatinine, Ser: 1 mg/dL (ref 0.4–1.2)
GFR: 54.39 mL/min — ABNORMAL LOW (ref >60.00)
Glucose, Bld: 94 mg/dL (ref 70–99)
Phosphorus: 4.1 mg/dL (ref 2.3–4.6)
Potassium: 4.4 mEq/L (ref 3.5–5.1)
SODIUM: 139 meq/L (ref 135–145)

## 2013-05-15 LAB — LIPID PANEL
Cholesterol: 136 mg/dL (ref 0–200)
HDL: 58.8 mg/dL (ref >39.00)
LDL Cholesterol: 59 mg/dL (ref 0–99)
Total CHOL/HDL Ratio: 2
Triglycerides: 91 mg/dL (ref 0.0–149.0)
VLDL: 18.2 mg/dL (ref 0.0–40.0)

## 2013-05-15 LAB — MICROALBUMIN / CREATININE URINE RATIO
Creatinine,U: 52.2 mg/dL
Microalb Creat Ratio: 0.4 mg/g (ref 0.0–30.0)
Microalb, Ur: 0.2 mg/dL (ref 0.0–1.9)

## 2013-05-15 LAB — TSH: TSH: 2.05 u[IU]/mL (ref 0.35–5.50)

## 2013-05-15 LAB — HEMOGLOBIN A1C: Hgb A1c MFr Bld: 6.3 % (ref 4.6–6.5)

## 2013-05-15 LAB — VITAMIN B12: Vitamin B-12: 366 pg/mL (ref 211–911)

## 2013-05-15 MED ORDER — METOPROLOL TARTRATE 25 MG PO TABS: 25.0000 mg | ORAL_TABLET | Freq: Two times a day (BID) | ORAL | Status: DC

## 2013-05-15 MED ORDER — FUROSEMIDE 40 MG PO TABS: 40.0000 mg | ORAL_TABLET | Freq: Every day | ORAL | Status: DC

## 2013-05-15 MED ORDER — ATORVASTATIN CALCIUM 20 MG PO TABS: ORAL_TABLET | ORAL | Status: DC

## 2013-05-15 NOTE — Assessment & Plan Note (Signed)
Not impressive for current infection, slow healing noted. Change to once daily dressing changes with triple abx ointment. See pt instructions for plan. Red flags to return sooner discussed.

## 2013-05-15 NOTE — Telephone Encounter (Signed)
Notes Recorded by Juanito Doom, MD on 05/15/2013 at 2:35 PM A,  Please let her know that her lungs looked OK, but she had a possible vertebral compression fracture. Ask her if she has back pain. If so then she should see her PCP.  Thanks B --  I spoke with patient about results and she verbalized understanding and had no questions

## 2013-05-15 NOTE — Progress Notes (Signed)
Pre visit review using our clinic review tool, if applicable. No additional management support is needed unless otherwise documented below in the visit note. 

## 2013-05-15 NOTE — Patient Instructions (Signed)
Change dressings once a day 1-2 hours after you get ready in the morning to keep area open for a portion of the day. Then dress with triple antibiotic ointment or neosporin and cover with gauze. Let us know if fever or spreading redness. Otherwise return in ~2 weeks for a recheck.

## 2013-05-15 NOTE — Progress Notes (Signed)
BP 122/78  Pulse 64  Temp(Src) 98.2 F (36.8 C) (Oral)  Wt 143 lb 8 oz (65.091 kg)   CC: recheck wound  Subjective:    Patient ID: Christine Blanchard, female    DOB: 10-04-1928, 80 y.o.   MRN: [id number]  HPI: Christine Blanchard is a 80 y.o. female presenting on 05/15/2013 for Wound Check   Seen last week by Dr. Lorelei Pont for shallow ulcer after foot injury on R dorsal foot.  Reviewed his note. Slipped on shoes while going to breakfast - shoes caused small cut onto skin which turned into blister. Recommended [name] assist with dressing changes, vaseline to wound with gauze dressing BID.  She has been compliant with vaseline and dressing changes bid.   Feels spot healing well.  Denies pain or spreading redness.  Relevant past medical, surgical, family and social history reviewed and updated as indicated.  Allergies and medications reviewed and updated. Current Outpatient Prescriptions on File Prior to Visit  Medication Sig  . ACCU-CHEK SOFTCLIX LANCETS lancets CHECK BLOOD SUGAR THREE TIMES A DAY AND AS DIRECTED  . aspirin EC 81 MG tablet Take 2 tablets (162 mg total) by mouth daily.  Marland Kitchen atorvastatin (LIPITOR) 20 MG tablet TAKE 1 TABLET NIGHTLY  . Blood Glucose Monitoring Suppl (ACCU-CHEK AVIVA PLUS) W/DEVICE KIT Check blood sugar three times a day and as directed.250.00, insulin dependent  . Casanthranol-Docusate Sodium 30-100 MG CAPS as needed.    . cetirizine (ZYRTEC ALLERGY) 10 MG tablet Take 1 tablet (10 mg total) by mouth daily.  . citalopram (CELEXA) 20 MG tablet TAKE 1 TABLET DAILY  . clopidogrel (PLAVIX) 75 MG tablet Take 1 tablet (75 mg total) by mouth daily.  . fenofibrate micronized (ANTARA) 130 MG capsule TAKE 1 CAPSULE DAILY  . fluticasone (FLONASE) 50 MCG/ACT nasal spray Place 2 sprays into the nose daily.  . furosemide (LASIX) 40 MG tablet Take 40 mg by mouth daily.  Marland Kitchen gabapentin (NEURONTIN) 100 MG capsule TAKE 1 CAPSULE TWICE A DAY  . glucose blood (ACCU-CHEK AVIVA)  test strip Use as instructed  . Insulin Glargine (LANTUS SOLOSTAR) 100 UNIT/ML Solostar Pen INJECT 16 UNITS (0.16ML) UNDER THE SKIN DAILY  . Insulin Pen Needle (B-D ULTRAFINE III SHORT PEN) 31G X 8 MM MISC Use as directed  . isosorbide mononitrate (ISMO,MONOKET) 20 MG tablet TAKE 1/2 TABLET (=10MG) BY MOUTH TWICE A DAY  . JANUVIA 100 MG tablet TAKE 1 TABLET DAILY  . LORazepam (ATIVAN) 1 MG tablet Take 1 tablet (1 mg total) by mouth at bedtime. insomnia  . metFORMIN (GLUCOPHAGE-XR) 500 MG 24 hr tablet TAKE 2 TABLETS (1,000 MG TOTAL) AT BEDTIME  . metoprolol tartrate (LOPRESSOR) 25 MG tablet Take 1 tablet (25 mg total) by mouth 2 (two) times daily.  . Multiple Vitamin (MULTIVITAMIN) capsule Take 1 capsule by mouth daily.    . Multiple Vitamin (THEREMS) TABS TAKE ONE TABLET BY MOUTH EACH DAY.  . nitroGLYCERIN (NITROSTAT) 0.4 MG SL tablet Place 1 tablet (0.4 mg total) under the tongue as needed.  . potassium chloride (KLOR-CON M10) 10 MEQ tablet TAKE 1 TABLET (10 MEQ TOTAL) BY MOUTH DAILY.  Marland Kitchen prednisoLONE sodium phosphate (INFLAMASE FORTE) 1 % ophthalmic solution Place 1 drop into both eyes daily.   . RABEprazole (ACIPHEX) 20 MG tablet TAKE (1) TABLET BY MOUTH DAILY.DO NOT CRUSH  . RANEXA 1000 MG SR tablet TAKE 1 TABLET TWICE A DAY  . Vitamin D, Ergocalciferol, (DRISDOL) 50000 UNITS CAPS capsule TAKE 1  CAPSULE ONCE A WEEK  . ZETIA 10 MG tablet TAKE 1 TABLET DAILY   No current facility-administered medications on file prior to visit.    Review of Systems Per HPI unless specifically indicated above    Objective:    BP 122/78  Pulse 64  Temp(Src) 98.2 F (36.8 C) (Oral)  Wt 143 lb 8 oz (65.091 kg)  Physical Exam  Nursing note and vitals reviewed. Constitutional: She appears well-developed and well-nourished. No distress.  Skin: Skin is warm and dry. No rash noted.  Blistered lesion approximately 1cm x 0.8cm on the dorsum of the 1st MTP on the right, minimal erythema no drainage         Assessment & Plan:   Problem List Items Addressed This Visit   Blister of right foot without infection - Primary     Not impressive for current infection, slow healing noted. Change to once daily dressing changes with triple abx ointment. See pt instructions for plan. Red flags to return sooner discussed.        Follow up plan: Return if symptoms worsen or fail to improve.

## 2013-05-16 ENCOUNTER — Encounter: Payer: Self-pay | Admitting: Family Medicine

## 2013-05-16 LAB — VITAMIN D 25 HYDROXY (VIT D DEFICIENCY, FRACTURES): VIT D 25 HYDROXY: 48 ng/mL (ref 30–89)

## 2013-05-21 ENCOUNTER — Encounter: Payer: Medicare Other | Admitting: Family Medicine

## 2013-05-28 ENCOUNTER — Ambulatory Visit (INDEPENDENT_AMBULATORY_CARE_PROVIDER_SITE_OTHER): Payer: Medicare Other | Admitting: Family Medicine

## 2013-05-28 ENCOUNTER — Encounter: Payer: Self-pay | Admitting: Family Medicine

## 2013-05-28 ENCOUNTER — Telehealth: Payer: Self-pay

## 2013-05-28 VITALS — BP 112/66 | HR 76 | Temp 97.9°F | Ht 64.0 in | Wt 143.0 lb

## 2013-05-28 DIAGNOSIS — Z0000 Encounter for general adult medical examination without abnormal findings: Secondary | ICD-10-CM

## 2013-05-28 DIAGNOSIS — F32A Depression, unspecified: Secondary | ICD-10-CM

## 2013-05-28 DIAGNOSIS — E785 Hyperlipidemia, unspecified: Secondary | ICD-10-CM

## 2013-05-28 DIAGNOSIS — I2510 Atherosclerotic heart disease of native coronary artery without angina pectoris: Secondary | ICD-10-CM

## 2013-05-28 DIAGNOSIS — I10 Essential (primary) hypertension: Secondary | ICD-10-CM

## 2013-05-28 DIAGNOSIS — F3289 Other specified depressive episodes: Secondary | ICD-10-CM

## 2013-05-28 DIAGNOSIS — F329 Major depressive disorder, single episode, unspecified: Secondary | ICD-10-CM

## 2013-05-28 DIAGNOSIS — N058 Unspecified nephritic syndrome with other morphologic changes: Secondary | ICD-10-CM

## 2013-05-28 DIAGNOSIS — Z23 Encounter for immunization: Secondary | ICD-10-CM | POA: Diagnosis not present

## 2013-05-28 DIAGNOSIS — S22009A Unspecified fracture of unspecified thoracic vertebra, initial encounter for closed fracture: Secondary | ICD-10-CM

## 2013-05-28 DIAGNOSIS — E1121 Type 2 diabetes mellitus with diabetic nephropathy: Secondary | ICD-10-CM

## 2013-05-28 DIAGNOSIS — S22070A Wedge compression fracture of T9-T10 vertebra, initial encounter for closed fracture: Secondary | ICD-10-CM

## 2013-05-28 DIAGNOSIS — E1129 Type 2 diabetes mellitus with other diabetic kidney complication: Secondary | ICD-10-CM | POA: Diagnosis not present

## 2013-05-28 DIAGNOSIS — S90821A Blister (nonthermal), right foot, initial encounter: Secondary | ICD-10-CM

## 2013-05-28 DIAGNOSIS — IMO0002 Reserved for concepts with insufficient information to code with codable children: Secondary | ICD-10-CM

## 2013-05-28 NOTE — Assessment & Plan Note (Signed)
Chronic, stable. Continue meds. 

## 2013-05-28 NOTE — Patient Instructions (Signed)
prevnar today. Set up power of attorney. When feeling ill or poor appetite, ok to decrease lantus by 1/2 to 8 units daily to prevent low sugars. Ok to eat oatmeal cooking occasionally. Good to see you today, call us with questions. Return to see me in 4 months for follow up

## 2013-05-28 NOTE — Assessment & Plan Note (Signed)
Chronic, stable. Continue medical management.

## 2013-05-28 NOTE — Progress Notes (Signed)
Pre visit review using our clinic review tool, if applicable. No additional management support is needed unless otherwise documented below in the visit note. 

## 2013-05-28 NOTE — Assessment & Plan Note (Signed)
I have personally reviewed the Medicare Annual Wellness questionnaire and have noted 1. The patient's medical and social history 2. Their use of alcohol, tobacco or illicit drugs 3. Their current medications and supplements 4. The patient's functional ability including ADL's, fall risks, home safety risks and hearing or visual impairment. 5. Diet and physical activity 6. Evidence for depression or mood disorders The patients weight, height, BMI have been recorded in the chart.  Hearing and vision has been addressed. I have made referrals, counseling and provided education to the patient based review of the above and I have provided the pt with a written personalized care plan for preventive services. See scanned questionairre. Advanced directives discussed: pt will set up HCPOA for next physical.  Reviewed preventative protocols and updated unless pt declined. UTD immunizations - give prevnar today. Age out of all other preventative healthcare.

## 2013-05-28 NOTE — Assessment & Plan Note (Signed)
Skin fully closed today.

## 2013-05-28 NOTE — Assessment & Plan Note (Signed)
Found on CXR recently. DEXA 2005 WNL per patient, but she has been on prednisone since. Pt denies pain at back, denies further eval at this time. Reviewed calcium recommended daily. Vit D at goal currently.

## 2013-05-28 NOTE — Progress Notes (Addendum)
BP 112/66  Pulse 76  Temp(Src) 97.9 F (36.6 C) (Oral)  Ht 5' 4"  (1.626 m)  Wt 143 lb (64.864 kg)  BMI 24.53 kg/m2   CC: medicare wellness visit  Subjective:    Patient ID: Christine Blanchard, female    DOB: [birth year], 80 y.o.   MRN: [id number]  HPI: Christine Blanchard is a 80 y.o. female presenting on 05/28/2013 for Annual Exam   Christine Blanchard is a pleasant 80 yo with h/o HTN, DM, h/o CVA after cath, severe CAD s/p multiple interventions now planned medical management, diastolic CHF and chronic hypoxemic respiratory failure with both obstructive and restrictive components and respiratory muscle weakness who is oxygen dependent.  She presents today for medicare wellness visit  She was recently found to have possible new T10 compression fracture.  States had normal dexa ~2005.  Denies significant back pain.  High risk 2/2 steroid use and falls.  Discussed option of rpt dexa scan - declines for now.  Reviewed dietary calcium.  Drinks 8oz glass of milk every night.does eat cheese and yogurt regularly.  Blister R foot has fully closed. 1 stool a day on average. Voiding well, no incontinence.  Held lasix this morning because she was coming to doctor's office.  Vision problems - known macular degeneration, has had corneal transplants. Followed by [facility]. Denies problem with vision but does have trouble reading small print.  Overall stable. Hearing - failed hearing screen.  Notices some trouble hearing but pt declines further evaluation. Does not want hearing aides - "I have too much to wear on my ears" High fall risk - walks slowly and uses walker. Denies depression/anhedonia.  Difficulty coping with medical problems.  Stable on celexa - doesn't feel would be able to decrease celexa.   Lives alone at [place] [facility] [place].  No pets Has lifeline. Daughter nearby [facility]) is CHF RN at [employer] nearby [place])  Preventative: No colonsocopy, not recommended (h/o several diverticulitis).  Mother did die fom colon cancer. No current problems with diverticulitis.  Pap - last had 3 years ago, Dr. Quincy Simmonds (OBGYN). Always monogamous relationship until husband died. Never had abnormal. Aged out. Mammo - 2011 normal per patient. No family or personal history of breast cancer. Has had breast biopsies in past but all benign. Doesn't want to continue screening.  Flu 2014 Pneumovax 2009, prevnar today Td 2014 Shingles - 2011 per patient Has had Hep B per patient Thinks had bone density scan done around 2005 and told normal, declines repeat. Advanced directives - has notebook with information on advanced directives. Has not filled out. Daughter [name] knows pt wouldn't want extraordinary measures. No HCPOA yet - to set this up.   Relevant past medical, surgical, family and social history reviewed and updated as indicated.  Allergies and medications reviewed and updated. Current Outpatient Prescriptions on File Prior to Visit  Medication Sig  . ACCU-CHEK SOFTCLIX LANCETS lancets CHECK BLOOD SUGAR THREE TIMES A DAY AND AS DIRECTED  . aspirin EC 81 MG tablet Take 2 tablets (162 mg total) by mouth daily.  Marland Kitchen atorvastatin (LIPITOR) 20 MG tablet TAKE 1 TABLET NIGHTLY  . Blood Glucose Monitoring Suppl (ACCU-CHEK AVIVA PLUS) W/DEVICE KIT Check blood sugar three times a day and as directed.250.00, insulin dependent  . Casanthranol-Docusate Sodium 30-100 MG CAPS as needed.    . cetirizine (ZYRTEC ALLERGY) 10 MG tablet Take 1 tablet (10 mg total) by mouth daily.  . citalopram (CELEXA) 20 MG tablet TAKE  1 TABLET DAILY  . clopidogrel (PLAVIX) 75 MG tablet Take 1 tablet (75 mg total) by mouth daily.  . fenofibrate micronized (ANTARA) 130 MG capsule TAKE 1 CAPSULE DAILY  . fluticasone (FLONASE) 50 MCG/ACT nasal spray Place 2 sprays into the nose daily.  . furosemide (LASIX) 40 MG tablet Take 1 tablet (40 mg total) by mouth daily.  Marland Kitchen gabapentin (NEURONTIN) 100 MG capsule TAKE 1 CAPSULE TWICE A DAY  .  glucose blood (ACCU-CHEK AVIVA) test strip Use as instructed  . Insulin Glargine (LANTUS SOLOSTAR) 100 UNIT/ML Solostar Pen INJECT 16 UNITS (0.16ML) UNDER THE SKIN DAILY  . Insulin Pen Needle (B-D ULTRAFINE III SHORT PEN) 31G X 8 MM MISC Use as directed  . isosorbide mononitrate (ISMO,MONOKET) 20 MG tablet TAKE 1/2 TABLET (=10MG) BY MOUTH TWICE A DAY  . JANUVIA 100 MG tablet TAKE 1 TABLET DAILY  . LORazepam (ATIVAN) 1 MG tablet Take 1 tablet (1 mg total) by mouth at bedtime. insomnia  . metFORMIN (GLUCOPHAGE-XR) 500 MG 24 hr tablet TAKE 2 TABLETS (1,000 MG TOTAL) AT BEDTIME  . metoprolol tartrate (LOPRESSOR) 25 MG tablet Take 1 tablet (25 mg total) by mouth 2 (two) times daily.  . Multiple Vitamin (MULTIVITAMIN) capsule Take 1 capsule by mouth daily.    . Multiple Vitamin (THEREMS) TABS TAKE ONE TABLET BY MOUTH EACH DAY.  . nitroGLYCERIN (NITROSTAT) 0.4 MG SL tablet Place 1 tablet (0.4 mg total) under the tongue as needed.  . potassium chloride (KLOR-CON M10) 10 MEQ tablet TAKE 1 TABLET (10 MEQ TOTAL) BY MOUTH DAILY.  Marland Kitchen prednisoLONE sodium phosphate (INFLAMASE FORTE) 1 % ophthalmic solution Place 1 drop into both eyes daily.   . RABEprazole (ACIPHEX) 20 MG tablet TAKE (1) TABLET BY MOUTH DAILY.DO NOT CRUSH  . RANEXA 1000 MG SR tablet TAKE 1 TABLET TWICE A DAY  . Vitamin D, Ergocalciferol, (DRISDOL) 50000 UNITS CAPS capsule TAKE 1 CAPSULE ONCE A WEEK  . ZETIA 10 MG tablet TAKE 1 TABLET DAILY   No current facility-administered medications on file prior to visit.    Review of Systems Per HPI unless specifically indicated above    Objective:    BP 112/66  Pulse 76  Temp(Src) 97.9 F (36.6 C) (Oral)  Ht 5' 4"  (1.626 m)  Wt 143 lb (64.864 kg)  BMI 24.53 kg/m2  Physical Exam  Nursing note and vitals reviewed. Constitutional: She is oriented to person, place, and time. She appears well-developed and well-nourished. No distress.  HENT:  Head: Normocephalic and atraumatic.  Right Ear:  Hearing, tympanic membrane, external ear and ear canal normal.  Left Ear: Hearing, tympanic membrane, external ear and ear canal normal.  Nose: Nose normal.  Mouth/Throat: Uvula is midline, oropharynx is clear and moist and mucous membranes are normal. No oropharyngeal exudate, posterior oropharyngeal edema or posterior oropharyngeal erythema.  Eyes: Conjunctivae and EOM are normal. Pupils are equal, round, and reactive to light. No scleral icterus.  Neck: Normal range of motion. Neck supple. No thyromegaly present.  Cardiovascular: Normal rate, regular rhythm, normal heart sounds and intact distal pulses.   No murmur heard. Pulses:      Radial pulses are 2+ on the right side, and 2+ on the left side.  Pulmonary/Chest: Effort normal and breath sounds normal. No respiratory distress. She has no wheezes. She has no rales.  Slight bibasilar crackles Breast: deferred  Abdominal: Soft. Bowel sounds are normal. She exhibits no distension and no mass. There is no tenderness. There is no rebound  and no guarding.  Genitourinary:  GYN: deferred  Musculoskeletal: Normal range of motion. She exhibits edema (tr).  Diabetic foot exam: Normal inspection No skin breakdown No calluses  Normal DP pulses Normal sensation to light touch and monofilament Nails normal Blister dorsal R 1st MTP fully closed  Lymphadenopathy:    She has no cervical adenopathy.  Neurological: She is alert and oriented to person, place, and time.  CN grossly intact, station and gait intact Recall 3/3 Calculation serial 7s - 4/5  Skin: Skin is warm and dry. No rash noted.  Psychiatric: She has a normal mood and affect. Her behavior is normal. Judgment and thought content normal.   Results for orders placed in visit on 05/15/13  LIPID PANEL      Result Value Ref Range   Cholesterol 136  0 - 200 mg/dL   Triglycerides 91.0  0.0 - 149.0 mg/dL   HDL 58.80  >39.00 mg/dL   VLDL 18.2  0.0 - 40.0 mg/dL   LDL Cholesterol 59  0 -  99 mg/dL   Total CHOL/HDL Ratio 2    RENAL FUNCTION PANEL      Result Value Ref Range   Sodium 139  135 - 145 mEq/L   Potassium 4.4  3.5 - 5.1 mEq/L   Chloride 98  96 - 112 mEq/L   CO2 33 (*) 19 - 32 mEq/L   Calcium 9.6  8.4 - 10.5 mg/dL   Albumin 3.7  3.5 - 5.2 g/dL   BUN 17  6 - 23 mg/dL   Creatinine, Ser 1.0  0.4 - 1.2 mg/dL   Glucose, Bld 94  70 - 99 mg/dL   Phosphorus 4.1  2.3 - 4.6 mg/dL   GFR 54.39 (*) >60.00 mL/min  VITAMIN B12      Result Value Ref Range   Vitamin B-12 366  211 - 911 pg/mL  HEMOGLOBIN A1C      Result Value Ref Range   Hemoglobin A1C 6.3  4.6 - 6.5 %  TSH      Result Value Ref Range   TSH 2.05  0.35 - 5.50 uIU/mL  MICROALBUMIN / CREATININE URINE RATIO      Result Value Ref Range   Microalb, Ur 0.2  0.0 - 1.9 mg/dL   Creatinine,U 52.2     Microalb Creat Ratio 0.4  0.0 - 30.0 mg/g  VITAMIN D 25 HYDROXY      Result Value Ref Range   Vit D, 25-Hydroxy 48  30 - 89 ng/mL      Assessment & Plan:   Problem List Items Addressed This Visit   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 Continue meds.    HYPERTENSION     Chronic, stable. Continue meds.    CAD     Chronic, stable. Continue medical management.    HYPERLIPIDEMIA-MIXED     Chronic, stable. Continue lipitor, zetia and fibrate.    Medicare annual wellness visit, subsequent - Primary     I have personally reviewed the Medicare Annual Wellness questionnaire and have noted 1. The patient's medical and social history 2. Their use of alcohol, tobacco or illicit drugs 3. Their current medications and supplements 4. The patient's functional ability including ADL's, fall risks, home safety risks and hearing or visual impairment. 5. Diet and physical activity 6. Evidence for depression or mood disorders The patients weight, height, BMI have been recorded in the chart.  Hearing and vision has been  addressed. I have made referrals, counseling and provided  education to the patient based review of the above and I have provided the pt with a written personalized care plan for preventive services. See scanned questionairre. Advanced directives discussed: pt will set up HCPOA for next physical.  Reviewed preventative protocols and updated unless pt declined. UTD immunizations - give prevnar today. Age out of all other preventative healthcare.    Depression     Continue 45m celexa.  Pt does not think would tolerate decreased dose currently.    Wedge compression fracture of T10 vertebra     Found on CXR recently. DEXA 2005 WNL per patient, but she has been on prednisone since. Pt denies pain at back, denies further eval at this time. Reviewed calcium recommended daily. Vit D at goal currently.    RESOLVED: Blister of right foot without infection     Skin fully closed today.        Follow up plan: Return in about 4 months (around 09/27/2013), or as needed, for follow up visit.

## 2013-05-28 NOTE — Assessment & Plan Note (Signed)
Chronic, stable. Continue lipitor, zetia and fibrate.

## 2013-05-28 NOTE — Telephone Encounter (Signed)
Levada Dy with [name] left v/m; Levada Dy said pt was seen today;Dr G put order for sliding scale insulin, if pt feeling ill or decreased appetite decrease lantus to 8 units and [facility] cannot do sliding scale. Pt is presently taking 16 units of lantus in AM Angela request cb.

## 2013-05-28 NOTE — Assessment & Plan Note (Signed)
Continue 20mg  celexa.  Pt does not think would tolerate decreased dose currently.

## 2013-05-28 NOTE — Addendum Note (Signed)
Addended by: Royann Shivers A on: 05/28/2013 11:59 AM   Modules accepted: Orders

## 2013-05-29 ENCOUNTER — Encounter: Payer: Self-pay | Admitting: Family Medicine

## 2013-05-29 ENCOUNTER — Encounter: Payer: Self-pay | Admitting: Pulmonary Disease

## 2013-05-29 ENCOUNTER — Telehealth: Payer: Self-pay | Admitting: Family Medicine

## 2013-05-29 NOTE — Telephone Encounter (Signed)
Becky at [facility] notified.

## 2013-05-29 NOTE — Telephone Encounter (Signed)
Relevant patient education assigned to patient using Emmi. ° °

## 2013-05-29 NOTE — Telephone Encounter (Signed)
May cancel this order.

## 2013-05-30 MED ORDER — FLUTICASONE PROPIONATE 50 MCG/ACT NA SUSP: 2.0000 | Freq: Every day | NASAL | Status: DC

## 2013-05-30 NOTE — Addendum Note (Signed)
Addended by: Desmond Dike C on: 05/30/2013 08:41 AM   Modules accepted: Orders

## 2013-06-01 ENCOUNTER — Telehealth: Payer: Self-pay

## 2013-06-01 NOTE — Telephone Encounter (Signed)
Relevant patient education assigned to patient using Emmi. ° °

## 2013-06-04 ENCOUNTER — Other Ambulatory Visit: Payer: Self-pay | Admitting: Physician Assistant

## 2013-06-13 ENCOUNTER — Other Ambulatory Visit: Payer: Self-pay

## 2013-06-13 NOTE — Telephone Encounter (Signed)
plz check on this - I believe hardcopy was sent in on Monday?

## 2013-06-13 NOTE — Telephone Encounter (Signed)
Christine Blanchard with [name] request [facility].Please advise.

## 2013-06-14 MED ORDER — LORAZEPAM 1 MG PO TABS: 1.0000 mg | ORAL_TABLET | Freq: Every day | ORAL | Status: DC

## 2013-06-14 NOTE — Telephone Encounter (Signed)
Rx faxed to [facility] with note to over-night ship to patient per request.

## 2013-06-14 NOTE — Telephone Encounter (Signed)
printed and placed in Kims' box. 

## 2013-06-14 NOTE — Telephone Encounter (Signed)
Spoke with Christine Blanchard. They received Rx, but it also needed to go to [facility]. I explained that their written request didn't say that. She asked that I call it in to [facility] for patient and per patient have it over-nighted. Rx will have to be faxed because [facility] will only take written scripts for controlled substances. Will need another script since the original was shredded.

## 2013-06-21 ENCOUNTER — Other Ambulatory Visit: Payer: Self-pay | Admitting: *Deleted

## 2013-06-21 MED ORDER — POTASSIUM CHLORIDE CRYS ER 10 MEQ PO TBCR: EXTENDED_RELEASE_TABLET | ORAL | Status: DC

## 2013-06-25 ENCOUNTER — Other Ambulatory Visit: Payer: Self-pay | Admitting: Family Medicine

## 2013-06-26 ENCOUNTER — Encounter: Payer: Self-pay | Admitting: Cardiovascular Disease

## 2013-06-26 ENCOUNTER — Ambulatory Visit (INDEPENDENT_AMBULATORY_CARE_PROVIDER_SITE_OTHER): Payer: Medicare Other | Admitting: Cardiovascular Disease

## 2013-06-26 VITALS — BP 100/60 | HR 71 | Ht 65.0 in | Wt 143.2 lb

## 2013-06-26 DIAGNOSIS — I5032 Chronic diastolic (congestive) heart failure: Secondary | ICD-10-CM

## 2013-06-26 DIAGNOSIS — I10 Essential (primary) hypertension: Secondary | ICD-10-CM | POA: Diagnosis not present

## 2013-06-26 DIAGNOSIS — N183 Chronic kidney disease, stage 3 unspecified: Secondary | ICD-10-CM

## 2013-06-26 DIAGNOSIS — R269 Unspecified abnormalities of gait and mobility: Secondary | ICD-10-CM

## 2013-06-26 DIAGNOSIS — J9610 Chronic respiratory failure, unspecified whether with hypoxia or hypercapnia: Secondary | ICD-10-CM

## 2013-06-26 DIAGNOSIS — R0902 Hypoxemia: Secondary | ICD-10-CM

## 2013-06-26 DIAGNOSIS — R079 Chest pain, unspecified: Secondary | ICD-10-CM

## 2013-06-26 DIAGNOSIS — J9611 Chronic respiratory failure with hypoxia: Secondary | ICD-10-CM

## 2013-06-26 DIAGNOSIS — I2510 Atherosclerotic heart disease of native coronary artery without angina pectoris: Secondary | ICD-10-CM | POA: Diagnosis not present

## 2013-06-26 DIAGNOSIS — I25810 Atherosclerosis of coronary artery bypass graft(s) without angina pectoris: Secondary | ICD-10-CM | POA: Diagnosis not present

## 2013-06-26 DIAGNOSIS — E785 Hyperlipidemia, unspecified: Secondary | ICD-10-CM

## 2013-06-26 DIAGNOSIS — I509 Heart failure, unspecified: Secondary | ICD-10-CM

## 2013-06-26 DIAGNOSIS — R2681 Unsteadiness on feet: Secondary | ICD-10-CM

## 2013-06-26 NOTE — Assessment & Plan Note (Signed)
Encouraged her to stay on her Lipitor 

## 2013-06-26 NOTE — Assessment & Plan Note (Signed)
Renal function has been stable on Lasix daily. Given her worsening leg edema, we'll try to advance her diuretic regimen very slowly

## 2013-06-26 NOTE — Progress Notes (Signed)
Patient ID: Christine Blanchard, female    DOB: 08/13/1928, 80 y.o.   MRN: [id number]  HPI Comments: Christine Blanchard is a very pleasant 80 year old woman with history of coronary artery disease, bypass surgery in 2004, hyperlipidemia, PCI 6 months after her bypass, repeat PCI one year later (She does report having a stroke after her cardiac catheterization), diabetes, hypertension, spinal stenosis who currently lives in a nursing home in [place], who has chronic unsteady gait  with chronic shortness of breath  on oxygen who presents for routine followup.  Pulmonary workup has revealed restrictive lung disease.  She currently lives at [facility].  She has been taking Lasix 40 mg daily . She reports having increasing lower extremity edema.  She reports having spells of weakness where her legs give out, arms start flailing, has a mouth tremor. She has to be helped to a wheelchair to ambulate. Then symptoms seemed to resolve after a short period of time. She denies any significant lightheadedness to these periods.  In the past she has had lightheaded spells. She typically walks with a walker  Blood pressure has been running low.  No falls or syncope.  No regular exercise program.  Denies any cough, worsening shortness of breath. He does her oxygen level at 2 L even with ambulation. No recent episodes of chest pain.  Previous cardiac CTA in the past for atypical chest pain that showed: Patent SVG to D1 with poor distal runoff,  Occluded LIMA to LAD.  Distal LAD never visualized,  RCA and circumflex patient with multiple 50% or less calcfic Lesions,  Poor distal runoff from stented IM/LAD and small D1 supplied by SVG  Previous  Echocardiogram was essentially normal with normal ejection fraction greater than 10%, diastolic dysfunction, normal right ventricular systolic pressures  Stress test showed no ischemia with ejection fraction greater than 55%   12/2011 In the hospital, total cholesterol 146, LDL 58, HDL  65  EKG shows normal sinus rhythm with rate 71 beats per minute, nonspecific T wave abnormality anterolateral leads, poor R-wave progression through the anterior precordial leads   Outpatient Encounter Prescriptions as of 06/26/2013  Medication Sig  . ACCU-CHEK SOFTCLIX LANCETS lancets CHECK BLOOD SUGAR THREE TIMES A DAY AND AS DIRECTED  . aspirin EC 81 MG tablet Take 2 tablets (162 mg total) by mouth daily.  Marland Kitchen atorvastatin (LIPITOR) 20 MG tablet TAKE 1 TABLET NIGHTLY  . Blood Glucose Monitoring Suppl (ACCU-CHEK AVIVA PLUS) W/DEVICE KIT Check blood sugar three times a day and as directed.250.00, insulin dependent  . Casanthranol-Docusate Sodium 30-100 MG CAPS as needed.    . cetirizine (ZYRTEC ALLERGY) 10 MG tablet Take 1 tablet (10 mg total) by mouth daily.  . citalopram (CELEXA) 20 MG tablet TAKE 1 TABLET DAILY  . clopidogrel (PLAVIX) 75 MG tablet Take 1 tablet (75 mg total) by mouth daily.  . fenofibrate micronized (ANTARA) 130 MG capsule TAKE 1 CAPSULE DAILY  . fluticasone (FLONASE) 50 MCG/ACT nasal spray Place 2 sprays into both nostrils daily.  . furosemide (LASIX) 40 MG tablet TAKE 1 TABLET DAILY  . gabapentin (NEURONTIN) 100 MG capsule TAKE 1 CAPSULE TWICE A DAY  . glucose blood (ACCU-CHEK AVIVA) test strip Use as instructed  . Insulin Glargine (LANTUS SOLOSTAR) 100 UNIT/ML Solostar Pen INJECT 16 UNITS (0.16ML) UNDER THE SKIN DAILY  . Insulin Pen Needle (B-D ULTRAFINE III SHORT PEN) 31G X 8 MM MISC Use as directed  . isosorbide mononitrate (ISMO,MONOKET) 20 MG tablet TAKE 1/2 TABLET (=  10MG) BY MOUTH TWICE A DAY  . JANUVIA 100 MG tablet TAKE 1 TABLET DAILY  . LORazepam (ATIVAN) 1 MG tablet Take 1 tablet (1 mg total) by mouth at bedtime. insomnia  . metFORMIN (GLUCOPHAGE-XR) 500 MG 24 hr tablet TAKE 2 TABLETS (1,000 MG TOTAL) AT BEDTIME  . metoprolol tartrate (LOPRESSOR) 25 MG tablet Take 1 tablet (25 mg total) by mouth 2 (two) times daily.  . Multiple Vitamin (MULTIVITAMIN) capsule  Take 1 capsule by mouth daily.    . Multiple Vitamin (THEREMS) TABS TAKE ONE TABLET BY MOUTH EACH DAY.  . nitroGLYCERIN (NITROSTAT) 0.4 MG SL tablet Place 1 tablet (0.4 mg total) under the tongue as needed.  . potassium chloride (KLOR-CON M10) 10 MEQ tablet TAKE 1 TABLET (10 MEQ TOTAL) BY MOUTH DAILY.  Marland Kitchen prednisoLONE sodium phosphate (INFLAMASE FORTE) 1 % ophthalmic solution Place 1 drop into both eyes daily.   . RABEprazole (ACIPHEX) 20 MG tablet TAKE 1 TABLET DAILY  . RANEXA 1000 MG SR tablet TAKE 1 TABLET TWICE A DAY  . Vitamin D, Ergocalciferol, (DRISDOL) 50000 UNITS CAPS capsule TAKE 1 CAPSULE ONCE A WEEK  . ZETIA 10 MG tablet TAKE 1 TABLET DAILY    Review of Systems  HENT: Negative.   Eyes: Negative.   Cardiovascular: Positive for leg swelling.  Gastrointestinal: Negative.   Endocrine: Negative.   Musculoskeletal: Positive for gait problem.       Profound leg weakness  Skin: Negative.   Allergic/Immunologic: Negative.   Neurological: Positive for weakness.  Hematological: Negative.   Psychiatric/Behavioral: Negative.   All other systems reviewed and are negative.   BP 100/60  Pulse 71  Ht 5' 5"  (1.651 m)  Wt 143 lb 4 oz (64.978 kg)  BMI 23.84 kg/m2  Physical Exam  Nursing note and vitals reviewed. Constitutional: She is oriented to person, place, and time. She appears well-developed and well-nourished.  On chronic oxygen nasal cannula  HENT:  Head: Normocephalic.  Nose: Nose normal.  Mouth/Throat: Oropharynx is clear and moist.  Eyes: Conjunctivae are normal. Pupils are equal, round, and reactive to light.  Neck: Normal range of motion. Neck supple. No JVD present.  Cardiovascular: Normal rate, regular rhythm, S1 normal, S2 normal and intact distal pulses.  Exam reveals no gallop and no friction rub.   Murmur heard.  Crescendo systolic murmur is present with a grade of 2/6  Pulmonary/Chest: Effort normal. No respiratory distress. She has decreased breath sounds.  She has no wheezes. She has no rales. She exhibits no tenderness.  Abdominal: Soft. Bowel sounds are normal. She exhibits no distension. There is no tenderness.  Musculoskeletal: Normal range of motion. She exhibits no tenderness.  Lymphadenopathy:    She has no cervical adenopathy.  Neurological: She is alert and oriented to person, place, and time. Coordination normal.  Skin: Skin is warm and dry. No rash noted. No erythema.  Psychiatric: She has a normal mood and affect. Her behavior is normal. Judgment and thought content normal.    Assessment and Plan

## 2013-06-26 NOTE — Assessment & Plan Note (Signed)
We will hold the isosorbide to avoid hypotensive episodes. Legs are weak at baseline

## 2013-06-26 NOTE — Assessment & Plan Note (Signed)
Currently with no symptoms of angina. No further workup at this time. Continue current medication regimen. 

## 2013-06-26 NOTE — Assessment & Plan Note (Signed)
Her respiratory status seems to be well compensated. We'll add extra Lasix as above 3 days per week in the afternoon

## 2013-06-26 NOTE — Assessment & Plan Note (Signed)
Blood pressures running low. I'm concerned about her episodes of weakness of the arms and legs with mouth tremor. Unable to exclude hypotension as a cause of these episodes. We will hold her isosorbide. Blood pressure on my check today was 95 systolic sitting

## 2013-06-26 NOTE — Patient Instructions (Signed)
Blood pressure is low, Please hold the isosorbide  Please give extra lasix 40 mg after lunch three days per week Ok to give lasix after lunch PRN for worsening leg swelling, ABD swelling, weight gain 4 pounds  Please call us if you have new issues that need to be addressed before your next appt.  Your physician wants you to follow-up in: 3 months.

## 2013-06-26 NOTE — Assessment & Plan Note (Signed)
Edema notable on exam today. We have recommended she take extra Lasix in the afternoon at least 3 days per week. Recommended she closely monitor her fluid intake

## 2013-06-28 ENCOUNTER — Encounter: Payer: Self-pay | Admitting: Family Medicine

## 2013-06-28 NOTE — Telephone Encounter (Signed)
See above. plz call to reschedule for 1 mo sooner with me.Marland Kitchen

## 2013-07-03 NOTE — Telephone Encounter (Signed)
Appt rescheduled

## 2013-07-13 ENCOUNTER — Other Ambulatory Visit: Payer: Self-pay | Admitting: Family Medicine

## 2013-07-13 ENCOUNTER — Other Ambulatory Visit: Payer: Self-pay | Admitting: Cardiovascular Disease

## 2013-07-14 ENCOUNTER — Encounter: Payer: Self-pay | Admitting: Family Medicine

## 2013-07-16 NOTE — Telephone Encounter (Signed)
lmtcb

## 2013-07-17 ENCOUNTER — Ambulatory Visit: Payer: Medicare Other | Admitting: Family Medicine

## 2013-07-17 NOTE — Telephone Encounter (Signed)
plz call to schedule f/u visit tomorrow (Tuesday) - may schedule at 12:45pm if pt able.

## 2013-07-17 NOTE — Telephone Encounter (Signed)
Message left for patient to return my call.  

## 2013-07-18 ENCOUNTER — Ambulatory Visit (INDEPENDENT_AMBULATORY_CARE_PROVIDER_SITE_OTHER): Payer: Medicare Other | Admitting: Family Medicine

## 2013-07-18 ENCOUNTER — Encounter: Payer: Self-pay | Admitting: Family Medicine

## 2013-07-18 VITALS — BP 112/70 | HR 80 | Temp 98.5°F | Wt 141.8 lb

## 2013-07-18 DIAGNOSIS — R1032 Left lower quadrant pain: Secondary | ICD-10-CM | POA: Diagnosis not present

## 2013-07-18 DIAGNOSIS — R109 Unspecified abdominal pain: Secondary | ICD-10-CM | POA: Insufficient documentation

## 2013-07-18 DIAGNOSIS — I2510 Atherosclerotic heart disease of native coronary artery without angina pectoris: Secondary | ICD-10-CM

## 2013-07-18 LAB — COMPREHENSIVE METABOLIC PANEL
ALT: 18 U/L (ref 0–35)
AST: 25 U/L (ref 0–37)
Albumin: 4 g/dL (ref 3.5–5.2)
Alkaline Phosphatase: 36 U/L — ABNORMAL LOW (ref 39–117)
BUN: 20 mg/dL (ref 6–23)
CHLORIDE: 96 meq/L (ref 96–112)
CO2: 35 mEq/L — ABNORMAL HIGH (ref 19–32)
CREATININE: 1 mg/dL (ref 0.4–1.2)
Calcium: 9.5 mg/dL (ref 8.4–10.5)
GFR: 53.77 mL/min — ABNORMAL LOW (ref >60.00)
Glucose, Bld: 110 mg/dL — ABNORMAL HIGH (ref 70–99)
Potassium: 3.6 mEq/L (ref 3.5–5.1)
Sodium: 138 mEq/L (ref 135–145)
Total Bilirubin: 0.5 mg/dL (ref 0.2–1.2)
Total Protein: 7.1 g/dL (ref 6.0–8.3)

## 2013-07-18 LAB — CBC WITH DIFFERENTIAL/PLATELET
BASOS PCT: 0.4 % (ref 0.0–3.0)
Basophils Absolute: 0 10*3/uL (ref 0.0–0.1)
Eosinophils Absolute: 0.3 10*3/uL (ref 0.0–0.7)
Eosinophils Relative: 3.9 % (ref 0.0–5.0)
HCT: 38.3 % (ref 36.0–46.0)
HEMOGLOBIN: 12.3 g/dL (ref 12.0–15.0)
LYMPHS PCT: 26.7 % (ref 12.0–46.0)
Lymphs Abs: 2.1 10*3/uL (ref 0.7–4.0)
MCHC: 32 g/dL (ref 30.0–36.0)
MCV: 89.8 fl (ref 78.0–100.0)
Monocytes Absolute: 1 10*3/uL (ref 0.1–1.0)
Monocytes Relative: 13.3 % — ABNORMAL HIGH (ref 3.0–12.0)
NEUTROS ABS: 4.4 10*3/uL (ref 1.4–7.7)
Neutrophils Relative %: 55.7 % (ref 43.0–77.0)
Platelets: 236 10*3/uL (ref 150.0–400.0)
RBC: 4.26 Mil/uL (ref 3.87–5.11)
RDW: 15 % (ref 11.5–15.5)
WBC: 7.9 10*3/uL (ref 4.0–10.5)

## 2013-07-18 MED ORDER — AMOXICILLIN-POT CLAVULANATE 875-125 MG PO TABS: 1.0000 | ORAL_TABLET | Freq: Two times a day (BID) | ORAL | Status: AC

## 2013-07-18 NOTE — Assessment & Plan Note (Signed)
Anticipate mild diverticulitis flare as she is improving off abx and with diet changes. Check CMP, CBC today. Provided with WASP for augmentin 10d course Discussed reasons to fill augmentin course . Clear liquid diet for 1 day then low fiber diet for 1 week then advance as tolerated.

## 2013-07-18 NOTE — Progress Notes (Signed)
Pre visit review using our clinic review tool, if applicable. No additional management support is needed unless otherwise documented below in the visit note. 

## 2013-07-18 NOTE — Patient Instructions (Signed)
I think you have diverticulitis flare - mild as it may be improving with diet changes. Continue low fiber diet, maybe even clear liquids. If persistent chills at night, or any fever or worsening abdominal pain or changing bowels, start antibiotic provided today (augmentin 10 day course). Let us know or seek urgent care if any worsening or not improving as expected.

## 2013-07-18 NOTE — Progress Notes (Signed)
BP 112/70  Pulse 80  Temp(Src) 98.5 F (36.9 C) (Oral)  Wt 141 lb 12 oz (64.297 kg)   CC: abd pain  Subjective:    Patient ID: Christine Blanchard, female    DOB: Mar 28, 1928, 80 y.o.   MRN: [id number]  HPI: Christine Blanchard is a 80 y.o. female presenting on 07/18/2013 for Abdominal Pain   Pt emailed me with concern for diverticulitis given worsening L lower quadrant abd pain. Present for the past week. For last 2 nights waking up with damp PJ top. Some bowel changes - constipation last 2 days. Normal flatus.  She had been eating more nuts for heart health.  She has cut this out as well as bacon in am and roughage. Denies dysuria or urinary symptoms.  She has history of diverticulitis (20+ years ago, not an issue since adjusting diet). She has been treating with 2 tylenol daily in am which helped.  Past Medical History  Diagnosis Date  . Hypertension   . Diabetes mellitus 2003    previously saw Dr. Elyse Hsu  . Hyperlipidemia   . History of chicken pox     has had shingles shot  . History of diverticulitis of colon 1991, 1992, 1994  . History of kidney stones 1952  . CVA (cerebral infarction)     after a stent, minimal R residual weakness, affects speech when tired, ?mild dysequilibrium  . Urinary incontinence     stress/urge, per D. Tannenbaum/[place]  . CAD (coronary artery disease)     severe, s/p 4 cardiac caths, minimally invasive CABG @[facility] , ICA stent 2004 then LAD stent 2005, MEDICAL MANAGEMENT ONLY, MAY USE REPEATED SL NITRO  . Breathing difficulty 2011    on O2 since 10/2009  . Syncope 2005    s/p w/u including CT scan and 24 hour Holter  . GERD (gastroesophageal reflux disease) 2004  . Macular degeneration     s/p corneal transplants  . Benign essential tremor     toprol XL and remeron  . Herpes zoster ophthalmicus 2009    history  . Hearing loss of both ears 08/2010    audiology eval 08/2010 - mod sensorineural heaing loss, rec trial digital hearing aids and re eval  yearly to monitor  . Vitamin D deficiency     on 50k units weekly  . Diastolic CHF 10/6293    grade 1, normal EF, mildly dilated LA, normal PA pressures  . Lumbar spinal stenosis     with severe spondylosis, s/p laminotomy  . Allergic rhinitis   . Chronic kidney disease     stage 3 kidney disease.  . Leg weakness, bilateral 2014    s/p neuro eval - thought multifactorial (diabetic neuropathy, lumbar stenosis, and chronic disease deconditioning).  Conservative management recommended  . Gait instability   . Wedge compression fracture of T10 vertebra 2015    by CXR    Relevant past medical, surgical, family and social history reviewed and updated as indicated.  Allergies and medications reviewed and updated. Current Outpatient Prescriptions on File Prior to Visit  Medication Sig  . ACCU-CHEK AVIVA PLUS test strip USE AS DIRECTED  . ACCU-CHEK SOFTCLIX LANCETS lancets CHECK BLOOD SUGAR THREE TIMES A DAY AND AS DIRECTED  . aspirin EC 81 MG tablet Take 2 tablets (162 mg total) by mouth daily.  Marland Kitchen atorvastatin (LIPITOR) 20 MG tablet TAKE 1 TABLET NIGHTLY  . B-D ULTRAFINE III SHORT PEN 31G X 8 MM MISC USE AS DIRECTED  .  Blood Glucose Monitoring Suppl (ACCU-CHEK AVIVA PLUS) W/DEVICE KIT Check blood sugar three times a day and as directed.250.00, insulin dependent  . Casanthranol-Docusate Sodium 30-100 MG CAPS as needed.    . cetirizine (ZYRTEC ALLERGY) 10 MG tablet Take 1 tablet (10 mg total) by mouth daily.  . citalopram (CELEXA) 20 MG tablet TAKE 1 TABLET DAILY  . clopidogrel (PLAVIX) 75 MG tablet Take 1 tablet (75 mg total) by mouth daily.  Marland Kitchen dimenhyDRINATE (DRAMAMINE) 50 MG tablet Take 50 mg by mouth at bedtime as needed.  . fenofibrate micronized (ANTARA) 130 MG capsule TAKE 1 CAPSULE DAILY  . fluticasone (FLONASE) 50 MCG/ACT nasal spray Place 2 sprays into both nostrils daily.  Marland Kitchen gabapentin (NEURONTIN) 100 MG capsule TAKE 1 CAPSULE TWICE A DAY  . Insulin Glargine (LANTUS SOLOSTAR) 100  UNIT/ML Solostar Pen INJECT 16 UNITS (0.16ML) UNDER THE SKIN DAILY  . JANUVIA 100 MG tablet TAKE 1 TABLET DAILY  . LORazepam (ATIVAN) 1 MG tablet Take 1 tablet (1 mg total) by mouth at bedtime. insomnia  . metFORMIN (GLUCOPHAGE-XR) 500 MG 24 hr tablet TAKE 2 TABLETS (1,000 MG TOTAL) AT BEDTIME  . metoprolol tartrate (LOPRESSOR) 25 MG tablet Take 1 tablet (25 mg total) by mouth 2 (two) times daily.  . Multiple Vitamin (MULTIVITAMIN) capsule Take 1 capsule by mouth daily.    . Multiple Vitamin (THEREMS) TABS TAKE ONE TABLET BY MOUTH EACH DAY.  Marland Kitchen NITROSTAT 0.4 MG SL tablet DISSOLVE 1 TABLET UNDER THE TONGUE AS NEEDED  . potassium chloride (KLOR-CON M10) 10 MEQ tablet TAKE 1 TABLET (10 MEQ TOTAL) BY MOUTH DAILY.  Marland Kitchen prednisoLONE sodium phosphate (INFLAMASE FORTE) 1 % ophthalmic solution Place 1 drop into both eyes daily.   . RABEprazole (ACIPHEX) 20 MG tablet TAKE 1 TABLET DAILY  . RANEXA 1000 MG SR tablet TAKE 1 TABLET TWICE A DAY  . Vitamin D, Ergocalciferol, (DRISDOL) 50000 UNITS CAPS capsule TAKE 1 CAPSULE ONCE A WEEK  . ZETIA 10 MG tablet TAKE 1 TABLET DAILY  . senna-docusate (SENOKOT-S) 8.6-50 MG per tablet Take 1 tablet by mouth as needed for mild constipation.   No current facility-administered medications on file prior to visit.    Review of Systems Per HPI unless specifically indicated above    Objective:    BP 112/70  Pulse 80  Temp(Src) 98.5 F (36.9 C) (Oral)  Wt 141 lb 12 oz (64.297 kg)  Physical Exam  Nursing note and vitals reviewed. Constitutional: She appears well-developed and well-nourished. No distress.  Abdominal: Soft. Normal appearance and bowel sounds are normal. She exhibits no distension and no mass. There is tenderness in the suprapubic area and left lower quadrant. There is no rigidity, no rebound, no guarding, no CVA tenderness and negative Murphy's sign.   Results for orders placed in visit on 05/15/13  LIPID PANEL      Result Value Ref Range    Cholesterol 136  0 - 200 mg/dL   Triglycerides 91.0  0.0 - 149.0 mg/dL   HDL 58.80  >39.00 mg/dL   VLDL 18.2  0.0 - 40.0 mg/dL   LDL Cholesterol 59  0 - 99 mg/dL   Total CHOL/HDL Ratio 2    RENAL FUNCTION PANEL      Result Value Ref Range   Sodium 139  135 - 145 mEq/L   Potassium 4.4  3.5 - 5.1 mEq/L   Chloride 98  96 - 112 mEq/L   CO2 33 (*) 19 - 32 mEq/L   Calcium  9.6  8.4 - 10.5 mg/dL   Albumin 3.7  3.5 - 5.2 g/dL   BUN 17  6 - 23 mg/dL   Creatinine, Ser 1.0  0.4 - 1.2 mg/dL   Glucose, Bld 94  70 - 99 mg/dL   Phosphorus 4.1  2.3 - 4.6 mg/dL   GFR 54.39 (*) >60.00 mL/min  VITAMIN B12      Result Value Ref Range   Vitamin B-12 366  211 - 911 pg/mL  HEMOGLOBIN A1C      Result Value Ref Range   Hemoglobin A1C 6.3  4.6 - 6.5 %  TSH      Result Value Ref Range   TSH 2.05  0.35 - 5.50 uIU/mL  MICROALBUMIN / CREATININE URINE RATIO      Result Value Ref Range   Microalb, Ur 0.2  0.0 - 1.9 mg/dL   Creatinine,U 52.2     Microalb Creat Ratio 0.4  0.0 - 30.0 mg/g  VITAMIN D 25 HYDROXY      Result Value Ref Range   Vit D, 25-Hydroxy 48  30 - 89 ng/mL      Assessment & Plan:   Problem List Items Addressed This Visit   LLQ abdominal pain - Primary     Anticipate mild diverticulitis flare as she is improving off abx and with diet changes. Check CMP, CBC today. Provided with WASP for augmentin 10d course Discussed reasons to fill augmentin course . Clear liquid diet for 1 day then low fiber diet for 1 week then advance as tolerated.    Relevant Medications      amoxicillin-clavulanate (AUGMENTIN) tablet 875-125 mg   Other Relevant Orders      CBC with Differential      Comprehensive metabolic panel       Follow up plan: Return if symptoms worsen or fail to improve.

## 2013-07-19 ENCOUNTER — Ambulatory Visit: Payer: Medicare Other | Admitting: Family Medicine

## 2013-07-30 ENCOUNTER — Telehealth: Payer: Self-pay | Admitting: *Deleted

## 2013-07-30 NOTE — Telephone Encounter (Signed)
Called Linda back at [facility] and was advised to disregard the phone call because she called the wrong doctor's office.

## 2013-08-01 ENCOUNTER — Telehealth: Payer: Self-pay | Admitting: Cardiovascular Disease

## 2013-08-01 ENCOUNTER — Other Ambulatory Visit: Payer: Self-pay | Admitting: Cardiovascular Disease

## 2013-08-01 NOTE — Telephone Encounter (Signed)
Trying to find about a hold for patient  Christine Blanchard number  [phone]

## 2013-08-03 ENCOUNTER — Other Ambulatory Visit: Payer: Self-pay | Admitting: Family Medicine

## 2013-08-03 ENCOUNTER — Other Ambulatory Visit: Payer: Self-pay | Admitting: Cardiovascular Disease

## 2013-08-03 ENCOUNTER — Other Ambulatory Visit: Payer: Self-pay | Admitting: Physician Assistant

## 2013-08-06 ENCOUNTER — Telehealth: Payer: Self-pay | Admitting: *Deleted

## 2013-08-06 NOTE — Telephone Encounter (Signed)
Please call any med tech at [place] [facility] regarding Isosorbide on hold. Is she to continue this or keep holding it. Please call to give new orders.

## 2013-08-06 NOTE — Telephone Encounter (Signed)
Per pt's covering nurse Levada Dy just signed off), Farhiya Rosten will need order faxed over to d/c isosorbide, as last ov instructed pt to "hold" with no indication of how long.

## 2013-08-16 ENCOUNTER — Other Ambulatory Visit: Payer: Self-pay

## 2013-08-16 MED ORDER — CLOPIDOGREL BISULFATE 75 MG PO TABS: ORAL_TABLET | ORAL | Status: DC

## 2013-08-17 ENCOUNTER — Telehealth: Payer: Self-pay

## 2013-08-17 NOTE — Telephone Encounter (Signed)
Patient is needing her potassium refilled. She is taking Lasix 40 mg one tablet daily and Lasix 80 mg on Tuesday, Thursday & Sat at noon daily. Please advise what dose of Potassium she needs and send a new Rx to [facility] mail order. Please contact patient ASAP!

## 2013-08-20 ENCOUNTER — Other Ambulatory Visit: Payer: Self-pay | Admitting: *Deleted

## 2013-08-20 MED ORDER — CLOPIDOGREL BISULFATE 75 MG PO TABS: ORAL_TABLET | ORAL | Status: DC

## 2013-08-21 ENCOUNTER — Encounter: Payer: Self-pay | Admitting: Family Medicine

## 2013-08-21 MED ORDER — POTASSIUM CHLORIDE CRYS ER 20 MEQ PO TBCR
EXTENDED_RELEASE_TABLET | ORAL | Status: DC
Start: 2013-08-21 — End: 2014-02-11

## 2013-08-21 NOTE — Telephone Encounter (Signed)
Would refill 20 mEq daily, #90

## 2013-08-21 NOTE — Telephone Encounter (Signed)
See Mychart message from patient.

## 2013-08-21 NOTE — Telephone Encounter (Signed)
Left message for pt to call back  °

## 2013-08-22 ENCOUNTER — Other Ambulatory Visit: Payer: Self-pay | Admitting: Family Medicine

## 2013-08-22 NOTE — Telephone Encounter (Signed)
Spoke w/ pt.  Advised pt that I have sent refill for her potassium.

## 2013-08-28 ENCOUNTER — Telehealth: Payer: Self-pay

## 2013-08-28 NOTE — Telephone Encounter (Signed)
Christine Blanchard pt's daughter left v/m; pt has appt on 08/29/13 and Christine Blanchard wants Dr Darnell Level to ck out possible concerns that pt may have electrolyte imbalance such as hypernatremia due to med(Lasix) increased by cardiologist or reaction to gabapentin. Christine Blanchard wants these concerns investigated.Pt having once a month where limbs are weak and pt is wiped out for the rest of the day. These episodes are increasing. [name] will not be coming to appt; pt does not like for her to come to the appts. Christine Blanchard does not need cb; [name] will talk with pt after appt.

## 2013-08-28 NOTE — Telephone Encounter (Signed)
Noted  

## 2013-08-29 ENCOUNTER — Ambulatory Visit (INDEPENDENT_AMBULATORY_CARE_PROVIDER_SITE_OTHER): Payer: Medicare Other | Admitting: Family Medicine

## 2013-08-29 ENCOUNTER — Other Ambulatory Visit: Payer: Self-pay | Admitting: *Deleted

## 2013-08-29 ENCOUNTER — Encounter: Payer: Self-pay | Admitting: Family Medicine

## 2013-08-29 ENCOUNTER — Other Ambulatory Visit: Payer: Self-pay | Admitting: Family Medicine

## 2013-08-29 VITALS — BP 108/60 | HR 74 | Temp 97.6°F | Wt 137.2 lb

## 2013-08-29 DIAGNOSIS — R251 Tremor, unspecified: Secondary | ICD-10-CM | POA: Insufficient documentation

## 2013-08-29 DIAGNOSIS — N058 Unspecified nephritic syndrome with other morphologic changes: Secondary | ICD-10-CM

## 2013-08-29 DIAGNOSIS — E1129 Type 2 diabetes mellitus with other diabetic kidney complication: Secondary | ICD-10-CM | POA: Diagnosis not present

## 2013-08-29 DIAGNOSIS — IMO0001 Reserved for inherently not codable concepts without codable children: Secondary | ICD-10-CM

## 2013-08-29 DIAGNOSIS — R259 Unspecified abnormal involuntary movements: Secondary | ICD-10-CM

## 2013-08-29 DIAGNOSIS — I2510 Atherosclerotic heart disease of native coronary artery without angina pectoris: Secondary | ICD-10-CM

## 2013-08-29 DIAGNOSIS — E1121 Type 2 diabetes mellitus with diabetic nephropathy: Secondary | ICD-10-CM

## 2013-08-29 LAB — HEMOGLOBIN A1C: HEMOGLOBIN A1C: 6.2 % (ref 4.6–6.5)

## 2013-08-29 LAB — COMPREHENSIVE METABOLIC PANEL
ALT: 22 U/L (ref 0–35)
AST: 29 U/L (ref 0–37)
Albumin: 3.8 g/dL (ref 3.5–5.2)
Alkaline Phosphatase: 44 U/L (ref 39–117)
BILIRUBIN TOTAL: 0.8 mg/dL (ref 0.2–1.2)
BUN: 18 mg/dL (ref 6–23)
CALCIUM: 9.7 mg/dL (ref 8.4–10.5)
CO2: 35 meq/L — AB (ref 19–32)
Chloride: 95 mEq/L — ABNORMAL LOW (ref 96–112)
Creatinine, Ser: 1.1 mg/dL (ref 0.4–1.2)
GFR: 49.35 mL/min — AB (ref >60.00)
Glucose, Bld: 96 mg/dL (ref 70–99)
Potassium: 4 mEq/L (ref 3.5–5.1)
SODIUM: 137 meq/L (ref 135–145)
TOTAL PROTEIN: 7.3 g/dL (ref 6.0–8.3)

## 2013-08-29 MED ORDER — FREESTYLE LITE DEVI: Status: DC

## 2013-08-29 MED ORDER — SITAGLIPTIN PHOSPHATE 50 MG PO TABS: ORAL_TABLET | ORAL | Status: DC

## 2013-08-29 MED ORDER — FREESTYLE LANCETS MISC: Status: DC

## 2013-08-29 MED ORDER — GLUCOSE BLOOD VI STRP: ORAL_STRIP | Status: DC

## 2013-08-29 MED ORDER — INSULIN GLARGINE 100 UNIT/ML SOLOSTAR PEN: 14.0000 [IU] | PEN_INJECTOR | Freq: Every day | SUBCUTANEOUS | Status: DC

## 2013-08-29 NOTE — Assessment & Plan Note (Signed)
Unclear etiology of shaking spells described. Check labwork including CMP today given recent increase in lasix to 40mg  daily with extra 40mg  three times a week. Has only been receiving potassium 1 tablet daily. ?med related - will stop gabapentin and decrease celexa to 10mg  daily. See below for other changes to be recommended.

## 2013-08-29 NOTE — Progress Notes (Signed)
BP 108/60  Pulse 74  Temp(Src) 97.6 F (36.4 C) (Oral)  Wt 137 lb 4 oz (62.256 kg)  SpO2 90%   CC: f/u visit  Subjective:    Patient ID: Christine Blanchard, female    DOB: [birth year], 80 y.o.   MRN: [id number]  HPI: Christine Blanchard is a 80 y.o. female presenting on 08/29/2013 for Follow-up   Christine Blanchard presents today to discuss concern over episodes where she starts shaking uncontrollably in both her arms and legs. Progressively worse - more frequent and longer lasting. After these episodes she stays very tired and sleeps rest of day. Next morning feels better. This tends to happen after over exertion. Sugars have not been low during these episodes. Ongoing episodes for last 6 months.   No LOC. No confusion. No pain. No fevers/chills.  Episodes not associated with chest discomfort or worse dyspnea or cough.   H/o essential tremor. On gabapentin for last 6 months. Daughter Christine Blanchard worried about electrolyte imbalance (recent increase in lasix dose) vs gabapentin reaction.   Lab Results  Component Value Date   HGBA1C 6.3 05/15/2013    Relevant past medical, surgical, family and social history reviewed and updated as indicated.  Allergies and medications reviewed and updated. Current Outpatient Prescriptions on File Prior to Visit  Medication Sig  . ACCU-CHEK AVIVA PLUS test strip USE AS DIRECTED  . ACCU-CHEK SOFTCLIX LANCETS lancets CHECK BLOOD SUGAR THREE TIMES A DAY AND AS DIRECTED  . aspirin EC 81 MG tablet Take 2 tablets (162 mg total) by mouth daily.  Marland Kitchen atorvastatin (LIPITOR) 20 MG tablet TAKE 1 TABLET NIGHTLY  . B-D ULTRAFINE III SHORT PEN 31G X 8 MM MISC USE AS DIRECTED  . Blood Glucose Monitoring Suppl (ACCU-CHEK AVIVA PLUS) W/DEVICE KIT Check blood sugar three times a day and as directed.250.00, insulin dependent  . Casanthranol-Docusate Sodium 30-100 MG CAPS as needed.    . cetirizine (ZYRTEC ALLERGY) 10 MG tablet Take 1 tablet (10 mg total) by mouth daily.  . clopidogrel  (PLAVIX) 75 MG tablet TAKE 1 TABLET DAILY  . dimenhyDRINATE (DRAMAMINE) 50 MG tablet Take 50 mg by mouth at bedtime as needed.  . fenofibrate micronized (ANTARA) 130 MG capsule TAKE 1 CAPSULE DAILY  . fluticasone (FLONASE) 50 MCG/ACT nasal spray Place 2 sprays into both nostrils daily.  . furosemide (LASIX) 40 MG tablet TAKE 1 TABLET DAILY with extra on Tu, Th, Sat  . Insulin Glargine (LANTUS SOLOSTAR) 100 UNIT/ML Solostar Pen INJECT 16 UNITS (0.16ML) UNDER THE SKIN DAILY  . JANUVIA 100 MG tablet TAKE 1 TABLET DAILY  . LORazepam (ATIVAN) 1 MG tablet Take 1 tablet (1 mg total) by mouth at bedtime. insomnia  . metFORMIN (GLUCOPHAGE-XR) 500 MG 24 hr tablet TAKE 2 TABLETS (1,000 MG TOTAL) AT BEDTIME  . metoprolol tartrate (LOPRESSOR) 25 MG tablet Take 1 tablet (25 mg total) by mouth 2 (two) times daily.  . Multiple Vitamin (MULTIVITAMIN) capsule Take 1 capsule by mouth daily.    . Multiple Vitamin (THEREMS) TABS TAKE ONE TABLET BY MOUTH EACH DAY.  Marland Kitchen NITROSTAT 0.4 MG SL tablet DISSOLVE 1 TABLET UNDER THE TONGUE AS NEEDED  . prednisoLONE sodium phosphate (INFLAMASE FORTE) 1 % ophthalmic solution Place 1 drop into both eyes daily.   . RABEprazole (ACIPHEX) 20 MG tablet TAKE 1 TABLET DAILY  . RANEXA 1000 MG SR tablet TAKE 1 TABLET TWICE A DAY  . senna-docusate (SENOKOT-S) 8.6-50 MG per tablet Take 1 tablet by mouth as needed  for mild constipation.  . Vitamin D, Ergocalciferol, (DRISDOL) 50000 UNITS CAPS capsule TAKE 1 CAPSULE ONCE A WEEK  . ZETIA 10 MG tablet TAKE 1 TABLET DAILY  . isosorbide dinitrate (ISORDIL) 20 MG tablet Take 10 mg by mouth 2 (two) times daily.  . potassium chloride SA (KLOR-CON M20) 20 MEQ tablet TAKE 1/2 (ONE-HALF) TABLET DAILY. TAKE 1 TABLET DAILY ONLY WHEN YOU TAKE LASIX   No current facility-administered medications on file prior to visit.    Review of Systems Per HPI unless specifically indicated above    Objective:    BP 108/60  Pulse 74  Temp(Src) 97.6 F (36.4  C) (Oral)  Wt 137 lb 4 oz (62.256 kg)  SpO2 90%  Physical Exam  Nursing note and vitals reviewed. Constitutional: She appears well-developed and well-nourished. No distress.  HENT:  Mouth/Throat: Oropharynx is clear and moist. No oropharyngeal exudate.  Cardiovascular: Normal rate, regular rhythm, normal heart sounds and intact distal pulses.   No murmur heard. Pulmonary/Chest: Effort normal and breath sounds normal. No respiratory distress. She has no wheezes. She has no rales.  Crackles bibasilarly  Musculoskeletal: She exhibits no edema.  Neurological:  No resting tremor, slight postural tremor, some intention tremor with FTN testing No masked fascies Walks unsteady with rollator  Skin: Skin is warm and dry. No rash noted.   Results for orders placed in visit on 07/18/13  CBC WITH DIFFERENTIAL      Result Value Ref Range   WBC 7.9  4.0 - 10.5 K/uL   RBC 4.26  3.87 - 5.11 Mil/uL   Hemoglobin 12.3  12.0 - 15.0 g/dL   HCT 38.3  36.0 - 46.0 %   MCV 89.8  78.0 - 100.0 fl   MCHC 32.0  30.0 - 36.0 g/dL   RDW 15.0  11.5 - 15.5 %   Platelets 236.0  150.0 - 400.0 K/uL   Neutrophils Relative % 55.7  43.0 - 77.0 %   Lymphocytes Relative 26.7  12.0 - 46.0 %   Monocytes Relative 13.3 (*) 3.0 - 12.0 %   Eosinophils Relative 3.9  0.0 - 5.0 %   Basophils Relative 0.4  0.0 - 3.0 %   Neutro Abs 4.4  1.4 - 7.7 K/uL   Lymphs Abs 2.1  0.7 - 4.0 K/uL   Monocytes Absolute 1.0  0.1 - 1.0 K/uL   Eosinophils Absolute 0.3  0.0 - 0.7 K/uL   Basophils Absolute 0.0  0.0 - 0.1 K/uL  COMPREHENSIVE METABOLIC PANEL      Result Value Ref Range   Sodium 138  135 - 145 mEq/L   Potassium 3.6  3.5 - 5.1 mEq/L   Chloride 96  96 - 112 mEq/L   CO2 35 (*) 19 - 32 mEq/L   Glucose, Bld 110 (*) 70 - 99 mg/dL   BUN 20  6 - 23 mg/dL   Creatinine, Ser 1.0  0.4 - 1.2 mg/dL   Total Bilirubin 0.5  0.2 - 1.2 mg/dL   Alkaline Phosphatase 36 (*) 39 - 117 U/L   AST 25  0 - 37 U/L   ALT 18  0 - 35 U/L   Total Protein  7.1  6.0 - 8.3 g/dL   Albumin 4.0  3.5 - 5.2 g/dL   Calcium 9.5  8.4 - 10.5 mg/dL   GFR 53.77 (*) >60.00 mL/min      Assessment & Plan:   Problem List Items Addressed This Visit   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    Relevant Orders      Comprehensive metabolic panel      Hemoglobin A1c   Shaking spells - Primary     Unclear etiology of shaking spells described. Check labwork including CMP today given recent increase in lasix to 80m daily with extra 431mthree times a week. Has only been receiving potassium 1 tablet daily. ?med related - will stop gabapentin and decrease celexa to 1067maily. See below for other changes to be recommended.        Follow up plan: Return in about 3 months (around 11/29/2013), or if symptoms worsen or fail to improve, for follow up visit.

## 2013-08-29 NOTE — Progress Notes (Signed)
Pre visit review using our clinic review tool, if applicable. No additional management support is needed unless otherwise documented below in the visit note. 

## 2013-08-29 NOTE — Patient Instructions (Signed)
Let's check blood work today. We will discuss possible change in cholesterol and diabetes medications. Stop gabapentin. Decrease celexa to 10mg  daily. We will call you with blood work results.

## 2013-08-29 NOTE — Assessment & Plan Note (Signed)
Check A1c today - if persistently elevated, will recommend decreased med dosing.

## 2013-09-05 ENCOUNTER — Encounter: Payer: Self-pay | Admitting: Cardiovascular Disease

## 2013-09-05 ENCOUNTER — Ambulatory Visit (INDEPENDENT_AMBULATORY_CARE_PROVIDER_SITE_OTHER): Payer: Medicare Other | Admitting: Cardiovascular Disease

## 2013-09-05 VITALS — BP 110/62 | HR 71 | Ht 65.0 in | Wt 134.8 lb

## 2013-09-05 DIAGNOSIS — I2510 Atherosclerotic heart disease of native coronary artery without angina pectoris: Secondary | ICD-10-CM

## 2013-09-05 DIAGNOSIS — R0602 Shortness of breath: Secondary | ICD-10-CM

## 2013-09-05 DIAGNOSIS — I25810 Atherosclerosis of coronary artery bypass graft(s) without angina pectoris: Secondary | ICD-10-CM

## 2013-09-05 DIAGNOSIS — R110 Nausea: Secondary | ICD-10-CM

## 2013-09-05 DIAGNOSIS — I10 Essential (primary) hypertension: Secondary | ICD-10-CM

## 2013-09-05 DIAGNOSIS — E785 Hyperlipidemia, unspecified: Secondary | ICD-10-CM

## 2013-09-05 DIAGNOSIS — I509 Heart failure, unspecified: Secondary | ICD-10-CM

## 2013-09-05 DIAGNOSIS — I5032 Chronic diastolic (congestive) heart failure: Secondary | ICD-10-CM

## 2013-09-05 DIAGNOSIS — R0789 Other chest pain: Secondary | ICD-10-CM | POA: Diagnosis not present

## 2013-09-05 NOTE — Assessment & Plan Note (Signed)
Recent nausea and anorexia. 9 pound weight loss since her last clinic visit. She's not eating well. I'm concerned about constipation. Abdomen is firm. Not consistent with ascites. Recommended she restart her stool softeners every day. We'll also put in a order for MiraLAX daily when necessary. We'll hold the afternoon Lasix. Recommended that she drink more fluids

## 2013-09-05 NOTE — Assessment & Plan Note (Signed)
Recommended she stay on her Lipitor 

## 2013-09-05 NOTE — Assessment & Plan Note (Signed)
Blood pressures running low. Daughter is requesting [place] to restart Isordil. We'll wait until blood pressure improves with better hydration

## 2013-09-05 NOTE — Patient Instructions (Signed)
Please take the afternoon lasix on Tu/Th/Sat only as needed for leg edema, weight greater than 140 pounds  Please start stool softener 2 tabs per day  Please start miralex daily PRN for constipation  Please call us if you have new issues that need to be addressed before your next appt.  Your physician wants you to follow-up in: 3 weeks

## 2013-09-05 NOTE — Assessment & Plan Note (Signed)
Shortness of breath likely multifactorial. At this point it does not seem that she has acute on chronic diastolic CHF. If anything she appears mildly dehydrated. Suspect she has general deconditioning, as well as her underlying lung disease

## 2013-09-05 NOTE — Assessment & Plan Note (Signed)
Etiology of her shortness of breath is unclear. Unable to rule out ischemia. Very hesitant to proceed with catheterization given prior stroke.

## 2013-09-05 NOTE — Progress Notes (Signed)
Patient ID: Christine Blanchard, female    DOB: [birth year], 80 y.o.   MRN: [id number]  HPI Comments: Christine Blanchard is a very pleasant 80 year old woman with history of coronary artery disease, bypass surgery in 2004, hyperlipidemia, PCI 6 months after her bypass, repeat PCI one year later (She does report having a stroke after her cardiac catheterization), diabetes, hypertension, spinal stenosis,  who currently lives in a nursing home in [place], who has chronic unsteady gait  with chronic shortness of breath on oxygen who presents for routine followup.  Pulmonary workup has revealed restrictive lung disease.  She currently lives at [facility]. She's had a prior cardiac CTA as detailed below  In followup today, she reports that she is feeling more short of breath, symptoms are worse with walking. Abdomen is distended, weight has dropped from 143 pounds down to 134 pounds. She is not having regular bowel movements. Chronic nausea. Recent hospitalization for diverticulitis per the patient. She is no longer taking Isordil. Previously was on stool softeners, does not taking this on a regular basis. She denies having any significant lower extremity swelling.  Reports her blood pressure is well controlled at home, even low. She continues on Lasix 40 mg every morning, extra 40 mg on Tuesday, Thursday, Saturday afternoon No recent chest pain.  No falls or syncope.  No regular exercise program.  Denies any cough. He does her oxygen level at 2 L even with ambulation. No recent episodes of chest pain.  Previous cardiac CTA in the past for atypical chest pain that showed: Patent SVG to D1 with poor distal runoff,  Occluded LIMA to LAD.  Distal LAD never visualized,  RCA and circumflex patient with multiple 50% or less calcfic Lesions,  Poor distal runoff from stented IM/LAD and small D1 supplied by SVG  Previous  Echocardiogram was essentially normal with normal ejection fraction greater than 14%, diastolic  dysfunction, normal right ventricular systolic pressures  Stress test showed no ischemia with ejection fraction greater than 55%   12/2011 In the hospital, total cholesterol 146, LDL 58, HDL 65  EKG shows normal sinus rhythm with rate 71 beats per minute, nonspecific T wave abnormality anterolateral leads, poor R-wave progression through the anterior precordial leads   Outpatient Encounter Prescriptions as of 78/06/2013  Medication Sig  . aspirin EC 81 MG tablet Take 2 tablets (162 mg total) by mouth daily.  Marland Kitchen atorvastatin (LIPITOR) 20 MG tablet TAKE 1 TABLET NIGHTLY  . B-D ULTRAFINE III SHORT PEN 31G X 8 MM MISC USE AS DIRECTED  . Blood Glucose Monitoring Suppl (FREESTYLE LITE) DEVI Use as directed to check sugar Dx:250.40  . Casanthranol-Docusate Sodium 30-100 MG CAPS as needed.    . cetirizine (ZYRTEC ALLERGY) 10 MG tablet Take 1 tablet (10 mg total) by mouth daily.  . citalopram (CELEXA) 10 MG tablet Take 1 tablet (10 mg total) by mouth daily.  . clopidogrel (PLAVIX) 75 MG tablet TAKE 1 TABLET DAILY  . dimenhyDRINATE (DRAMAMINE) 50 MG tablet Take 50 mg by mouth at bedtime as needed.  . fenofibrate micronized (ANTARA) 130 MG capsule TAKE 1 CAPSULE DAILY  . fluticasone (FLONASE) 50 MCG/ACT nasal spray Place 2 sprays into both nostrils daily.  . furosemide (LASIX) 40 MG tablet TAKE 1 TABLET DAILY with extra on Tu, Th, Sat  . glucose blood (FREESTYLE LITE) test strip Use to check sugar three times daily Dx: 250.40  . Insulin Glargine (LANTUS) 100 UNIT/ML Solostar Pen Inject 16 Units into the skin  daily.  . Lancets (FREESTYLE) lancets Use to check sugar three times daily Dx: 250.40  . LORazepam (ATIVAN) 1 MG tablet Take 1 tablet (1 mg total) by mouth at bedtime. insomnia  . metFORMIN (GLUCOPHAGE-XR) 500 MG 24 hr tablet TAKE 2 TABLETS (1,000 MG TOTAL) AT BEDTIME  . metoprolol tartrate (LOPRESSOR) 25 MG tablet Take 1 tablet (25 mg total) by mouth 2 (two) times daily.  . Multiple Vitamin  (MULTIVITAMIN) capsule Take 1 capsule by mouth daily.    Marland Kitchen NITROSTAT 0.4 MG SL tablet DISSOLVE 1 TABLET UNDER THE TONGUE AS NEEDED  . potassium chloride SA (KLOR-CON M20) 20 MEQ tablet TAKE 1/2 (ONE-HALF) TABLET DAILY. TAKE 1 TABLET DAILY ONLY WHEN YOU TAKE LASIX  . prednisoLONE sodium phosphate (INFLAMASE FORTE) 1 % ophthalmic solution Place 1 drop into both eyes daily.   . RABEprazole (ACIPHEX) 20 MG tablet TAKE 1 TABLET DAILY  . RANEXA 1000 MG SR tablet TAKE 1 TABLET TWICE A DAY  . senna-docusate (SENOKOT-S) 8.6-50 MG per tablet Take 1 tablet by mouth as needed for mild constipation.  . sitaGLIPtin (JANUVIA) 100 MG tablet Take 100 mg by mouth daily.  . Vitamin D, Ergocalciferol, (DRISDOL) 50000 UNITS CAPS capsule TAKE 1 CAPSULE ONCE A WEEK  . ZETIA 10 MG tablet TAKE 1 TABLET DAILY    Review of Systems  HENT: Negative.   Eyes: Negative.   Respiratory: Positive for shortness of breath.   Cardiovascular: Negative.   Gastrointestinal: Negative.   Endocrine: Negative.   Musculoskeletal: Positive for gait problem.       Profound leg weakness  Skin: Negative.   Allergic/Immunologic: Negative.   Neurological: Positive for weakness.  Hematological: Negative.   Psychiatric/Behavioral: Negative.   All other systems reviewed and are negative.   BP 110/62  Pulse 71  Ht 5\' 5"  (1.651 m)  Wt 134 lb 12 oz (61.122 kg)  BMI 22.42 kg/m2  Physical Exam  Nursing note and vitals reviewed. Constitutional: She is oriented to person, place, and time. She appears well-developed and well-nourished.  On chronic oxygen nasal cannula  HENT:  Head: Normocephalic.  Nose: Nose normal.  Mouth/Throat: Oropharynx is clear and moist.  Eyes: Conjunctivae are normal. Pupils are equal, round, and reactive to light.  Neck: Normal range of motion. Neck supple. No JVD present.  Cardiovascular: Normal rate, regular rhythm, S1 normal, S2 normal and intact distal pulses.  Exam reveals no gallop and no friction rub.    Murmur heard.  Crescendo systolic murmur is present with a grade of 2/6  Pulmonary/Chest: Effort normal. No respiratory distress. She has decreased breath sounds. She has no wheezes. She has no rales. She exhibits no tenderness.  Abdominal: Soft. Bowel sounds are normal. She exhibits no distension. There is no tenderness.  Musculoskeletal: Normal range of motion. She exhibits no edema and no tenderness.  Lymphadenopathy:    She has no cervical adenopathy.  Neurological: She is alert and oriented to person, place, and time. Coordination normal.  Skin: Skin is warm and dry. No rash noted. No erythema.  Psychiatric: She has a normal mood and affect. Her behavior is normal. Judgment and thought content normal.    Assessment and Plan

## 2013-09-05 NOTE — Assessment & Plan Note (Addendum)
Recent lab work and clinical presentation did not suggest fluid overload. If anything lab work suggest mild dehydration. She has no lower extremity edema. Weight is down 9 pounds. We will hold the Lasix in the afternoon, continue 40 mg every morning. Extra Lasix could be given for worsening leg edema or weight gain above 140 pounds

## 2013-09-06 ENCOUNTER — Telehealth: Payer: Self-pay | Admitting: *Deleted

## 2013-09-06 NOTE — Telephone Encounter (Signed)
Please call [facility]) Rx written for lasix with no strength. Also, a stool softner with no brand name.

## 2013-09-06 NOTE — Telephone Encounter (Signed)
Spoke w/ [facility].  Advised her that the prescriptions that pt was given yesterday were instructions meant for the staff at pt's nursing facility. She verbalizes understanding and will make sure they get these back.

## 2013-09-11 ENCOUNTER — Telehealth: Payer: Self-pay

## 2013-09-11 NOTE — Telephone Encounter (Signed)
Nurse with [name] needs a return call regarding her medication, her stool softener. Please call.

## 2013-09-11 NOTE — Telephone Encounter (Signed)
Levada Dy, RN states that pt reports that 2 stool softeners are too much for her and she would like this decreased to one prn.  Advised her that this if fine. She would like order faxed to [phone].

## 2013-09-12 ENCOUNTER — Other Ambulatory Visit: Payer: Self-pay | Admitting: Cardiovascular Disease

## 2013-09-12 ENCOUNTER — Encounter: Payer: Self-pay | Admitting: Family Medicine

## 2013-09-12 DIAGNOSIS — E1121 Type 2 diabetes mellitus with diabetic nephropathy: Secondary | ICD-10-CM

## 2013-09-12 DIAGNOSIS — Z947 Corneal transplant status: Secondary | ICD-10-CM

## 2013-09-12 NOTE — Telephone Encounter (Signed)
Referral to [facility]. H/o corneal transplants Past Medical History  Diagnosis Date  . Hypertension   . Diabetes mellitus 2003    previously saw Dr. Elyse Hsu  . Hyperlipidemia   . History of chicken pox     has had shingles shot  . History of diverticulitis of colon 1991, 1992, 1994  . History of kidney stones 1952  . CVA (cerebral infarction)     after a stent, minimal R residual weakness, affects speech when tired, ?mild dysequilibrium  . Urinary incontinence     stress/urge, per D. Tannenbaum/[place]  . CAD (coronary artery disease)     severe, s/p 4 cardiac caths, minimally invasive CABG @[facility] , ICA stent 2004 then LAD stent 2005, MEDICAL MANAGEMENT ONLY, MAY USE REPEATED SL NITRO  . Breathing difficulty 2011    on O2 since 10/2009  . Syncope 2005    s/p w/u including CT scan and 24 hour Holter  . GERD (gastroesophageal reflux disease) 2004  . Macular degeneration     s/p corneal transplants  . Benign essential tremor     toprol XL and remeron  . Herpes zoster ophthalmicus 2009    history  . Hearing loss of both ears 08/2010    audiology eval 08/2010 - mod sensorineural heaing loss, rec trial digital hearing aids and re eval yearly to monitor  . Vitamin D deficiency     on 50k units weekly  . Diastolic CHF 10/9355    grade 1, normal EF, mildly dilated LA, normal PA pressures  . Lumbar spinal stenosis     with severe spondylosis, s/p laminotomy  . Allergic rhinitis   . Chronic kidney disease     stage 3 kidney disease.  . Leg weakness, bilateral 2014    s/p neuro eval - thought multifactorial (diabetic neuropathy, lumbar stenosis, and chronic disease deconditioning).  Conservative management recommended  . Gait instability   . Wedge compression fracture of T10 vertebra 2015    by CXR   Past Surgical History  Procedure Laterality Date  . Cholecystectomy  1985  . Cervical fusion  2002    anterior decompression and fusion C6/7  . Coronary artery bypass graft   2004  . Percutaneous coronary stent intervention (pci-s)  2005    stents x 2 (intermediate and LAD), 2nd complicated by stroke  . Corneal transplant      x3  . Appendectomy  1948  . Breast biopsy  2001    x2, both benign, last mammo 2011, last pap smear 2011  . Cardiac catheterization  2009    x5  . Cataract extraction      bilateral, corneal dystrophy  . Cardiovascular stress test  2010    nuclear, normal  . Dexa  2005    normal  . Lumbar spine surgery  2010    laminotomy (L2/3, 3/4)

## 2013-09-18 ENCOUNTER — Telehealth: Payer: Self-pay | Admitting: Family Medicine

## 2013-09-18 NOTE — Telephone Encounter (Signed)
Can we increase fish in diet? I suggest increasing januvia back to 100mg  daily - then update me with effect over next 2 weeks.

## 2013-09-18 NOTE — Telephone Encounter (Signed)
Pt request call back about prescriptions. She states [facility] has double orders for medications and will not fill until clarified.

## 2013-09-18 NOTE — Telephone Encounter (Signed)
Sugars are running higher than normal occasionally (they are running over 250) since her diverticulitis flare and change in [place]. She is having a hard time finding good protein that she can eat with her kidneys for lunch and dinner since she is limited on foods. She is eating chicken, [place], peanut butter, cottage cheese.  Any other suggestions? (I have spoken with her about the medication matter and wanted to see if you were going to change anything before I called [facility])

## 2013-09-19 ENCOUNTER — Other Ambulatory Visit: Payer: Self-pay | Admitting: *Deleted

## 2013-09-19 MED ORDER — CITALOPRAM HYDROBROMIDE 10 MG PO TABS: 10.0000 mg | ORAL_TABLET | Freq: Every day | ORAL | Status: DC

## 2013-09-19 MED ORDER — SITAGLIPTIN PHOSPHATE 100 MG PO TABS: 100.0000 mg | ORAL_TABLET | Freq: Every day | ORAL | Status: DC

## 2013-09-19 NOTE — Telephone Encounter (Signed)
Patient notified and will try to increase increase fish in diet. With having her meals prepared at the ALF, she is pretty much "at their mercy" as she described it, but she will try to get some canned salmon to keep in her room for snacks. Meds taken care of.

## 2013-09-26 ENCOUNTER — Ambulatory Visit: Payer: Medicare Other | Admitting: Cardiovascular Disease

## 2013-09-27 ENCOUNTER — Ambulatory Visit: Payer: Medicare Other | Admitting: Family Medicine

## 2013-09-28 ENCOUNTER — Encounter: Payer: Self-pay | Admitting: Cardiovascular Disease

## 2013-09-28 ENCOUNTER — Ambulatory Visit (INDEPENDENT_AMBULATORY_CARE_PROVIDER_SITE_OTHER): Payer: Medicare Other | Admitting: Cardiovascular Disease

## 2013-09-28 VITALS — BP 120/70 | HR 71 | Ht 64.0 in | Wt 136.5 lb

## 2013-09-28 DIAGNOSIS — I10 Essential (primary) hypertension: Secondary | ICD-10-CM

## 2013-09-28 DIAGNOSIS — I5032 Chronic diastolic (congestive) heart failure: Secondary | ICD-10-CM

## 2013-09-28 DIAGNOSIS — R0789 Other chest pain: Secondary | ICD-10-CM

## 2013-09-28 DIAGNOSIS — I25810 Atherosclerosis of coronary artery bypass graft(s) without angina pectoris: Secondary | ICD-10-CM | POA: Diagnosis not present

## 2013-09-28 DIAGNOSIS — R0602 Shortness of breath: Secondary | ICD-10-CM

## 2013-09-28 DIAGNOSIS — E785 Hyperlipidemia, unspecified: Secondary | ICD-10-CM

## 2013-09-28 DIAGNOSIS — I509 Heart failure, unspecified: Secondary | ICD-10-CM

## 2013-09-28 DIAGNOSIS — I2510 Atherosclerotic heart disease of native coronary artery without angina pectoris: Secondary | ICD-10-CM | POA: Diagnosis not present

## 2013-09-28 DIAGNOSIS — IMO0001 Reserved for inherently not codable concepts without codable children: Secondary | ICD-10-CM

## 2013-09-28 DIAGNOSIS — R259 Unspecified abnormal involuntary movements: Secondary | ICD-10-CM

## 2013-09-28 NOTE — Assessment & Plan Note (Signed)
Currently with no symptoms of angina. No further workup at this time. Continue current medication regimen. 

## 2013-09-28 NOTE — Assessment & Plan Note (Signed)
We have suggested she stay on her Lasix 40 mg daily.  Would add extra Lasix as needed for worsening leg swelling, weight above 140 pounds or worsening shortness of breath

## 2013-09-28 NOTE — Assessment & Plan Note (Signed)
Blood pressure is well controlled on today's visit. No changes made to the medications. 

## 2013-09-28 NOTE — Assessment & Plan Note (Signed)
Relatively stable shortness of breath, on 2 L nasal cannula.

## 2013-09-28 NOTE — Assessment & Plan Note (Signed)
She reports that shaking spells seem to have significantly improved after recent medication changes by Dr. Danise Mina. She is very happy

## 2013-09-28 NOTE — Progress Notes (Signed)
Patient ID: Christine Blanchard, female    DOB: Jan 14, 1929, 80 y.o.   MRN: [id number]  HPI Comments: Christine Blanchard is a very pleasant 9 -year-old woman with history of coronary artery disease, bypass surgery in 2004, hyperlipidemia, PCI 6 months after her bypass, repeat PCI one year later (She does report having a stroke after her cardiac catheterization), diabetes, hypertension, spinal stenosis,  who currently lives in a nursing home in [place], who has chronic unsteady gait  with chronic shortness of breath on oxygen who presents for routine followup.  Pulmonary workup has revealed restrictive lung disease.  She currently lives at [facility]. She's had a prior cardiac CTA as detailed below  In followup today, she reports that her breathing is relatively stable. On her last clinic visit, we changed her diuretic and she continues to take Lasix daily but is not taking afternoon Lasix on a regular basis, only for weight gain, or worsening lower extremity edema. She does report having some ankle swelling noted more at the end of the day but she has not been taking extra Lasix in the afternoon. She does not like to take extra Lasix. She has been drinking less fluids. Weight is up 2 pounds from her prior clinic visit No recent chest pain.  Chronic nausea. Previous hospitalization for diverticulitis per the patient.  Previously was on stool softeners, does not taking this on a regular basis.   No falls or syncope.  No regular exercise program.  Denies any cough. He does her oxygen level at 2 L even with ambulation.  Previous cardiac CTA in the past for atypical chest pain that showed: Patent SVG to D1 with poor distal runoff,  Occluded LIMA to LAD.  Distal LAD never visualized,  RCA and circumflex patient with multiple 50% or less calcfic Lesions,  Poor distal runoff from stented IM/LAD and small D1 supplied by SVG  Previous  Echocardiogram was essentially normal with normal ejection fraction greater than  97%, diastolic dysfunction, normal right ventricular systolic pressures  Stress test showed no ischemia with ejection fraction greater than 55%   12/2011 In the hospital, total cholesterol 146, LDL 58, HDL 65  EKG shows normal sinus rhythm with rate 71 beats per minute, nonspecific T wave abnormality anterolateral leads, poor R-wave progression through the anterior precordial leads   Outpatient Encounter Prescriptions as of 09/28/2013  Medication Sig  . aspirin EC 81 MG tablet Take 2 tablets (162 mg total) by mouth daily.  Marland Kitchen atorvastatin (LIPITOR) 20 MG tablet TAKE 1 TABLET NIGHTLY  . B-D ULTRAFINE III SHORT PEN 31G X 8 MM MISC USE AS DIRECTED  . Blood Glucose Monitoring Suppl (FREESTYLE LITE) DEVI Use as directed to check sugar Dx:250.40  . Casanthranol-Docusate Sodium 30-100 MG CAPS as needed.    . cetirizine (ZYRTEC ALLERGY) 10 MG tablet Take 1 tablet (10 mg total) by mouth daily.  . citalopram (CELEXA) 10 MG tablet Take 1 tablet (10 mg total) by mouth daily.  . clopidogrel (PLAVIX) 75 MG tablet TAKE 1 TABLET DAILY  . dimenhyDRINATE (DRAMAMINE) 50 MG tablet Take 50 mg by mouth at bedtime as needed.  . fenofibrate micronized (ANTARA) 130 MG capsule TAKE 1 CAPSULE DAILY  . fluticasone (FLONASE) 50 MCG/ACT nasal spray Place 2 sprays into both nostrils daily.  . furosemide (LASIX) 40 MG tablet TAKE 1 TABLET DAILY with extra on Tu, Th, Sat  . glucose blood (FREESTYLE LITE) test strip Use to check sugar three times daily Dx: 250.40  .  Insulin Glargine (LANTUS) 100 UNIT/ML Solostar Pen Inject 16 Units into the skin daily.  . Lancets (FREESTYLE) lancets Use to check sugar three times daily Dx: 250.40  . LORazepam (ATIVAN) 1 MG tablet Take 1 tablet (1 mg total) by mouth at bedtime. insomnia  . metFORMIN (GLUCOPHAGE-XR) 500 MG 24 hr tablet TAKE 2 TABLETS (1,000 MG TOTAL) AT BEDTIME  . metoprolol tartrate (LOPRESSOR) 25 MG tablet Take 1 tablet (25 mg total) by mouth 2 (two) times daily.  .  metoprolol tartrate (LOPRESSOR) 25 MG tablet TAKE 1 TABLET TWO TIMES DAILY  . Multiple Vitamin (MULTIVITAMIN) capsule Take 1 capsule by mouth daily.    Marland Kitchen NITROSTAT 0.4 MG SL tablet DISSOLVE 1 TABLET UNDER THE TONGUE AS NEEDED  . potassium chloride SA (KLOR-CON M20) 20 MEQ tablet TAKE 1/2 (ONE-HALF) TABLET DAILY. TAKE 1 TABLET DAILY ONLY WHEN YOU TAKE LASIX  . prednisoLONE sodium phosphate (INFLAMASE FORTE) 1 % ophthalmic solution Place 1 drop into both eyes daily.   . RABEprazole (ACIPHEX) 20 MG tablet TAKE 1 TABLET DAILY  . RANEXA 1000 MG SR tablet TAKE 1 TABLET TWICE A DAY  . senna-docusate (SENOKOT-S) 8.6-50 MG per tablet Take 1 tablet by mouth as needed for mild constipation.  . sitaGLIPtin (JANUVIA) 100 MG tablet Take 1 tablet (100 mg total) by mouth daily.  . Vitamin D, Ergocalciferol, (DRISDOL) 50000 UNITS CAPS capsule TAKE 1 CAPSULE ONCE A WEEK  . ZETIA 10 MG tablet TAKE 1 TABLET DAILY    Review of Systems  HENT: Negative.   Eyes: Negative.   Respiratory: Positive for shortness of breath.   Cardiovascular: Negative.   Gastrointestinal: Negative.   Endocrine: Negative.   Musculoskeletal: Positive for gait problem.       Profound leg weakness  Skin: Negative.   Allergic/Immunologic: Negative.   Neurological: Positive for weakness.  Hematological: Negative.   Psychiatric/Behavioral: Negative.   All other systems reviewed and are negative.   BP 120/70  Pulse 71  Ht 5\' 4"  (1.626 m)  Wt 136 lb 8 oz (61.916 kg)  BMI 23.42 kg/m2  Physical Exam  Nursing note and vitals reviewed. Constitutional: She is oriented to person, place, and time. She appears well-developed and well-nourished.  On chronic oxygen nasal cannula  HENT:  Head: Normocephalic.  Nose: Nose normal.  Mouth/Throat: Oropharynx is clear and moist.  Eyes: Conjunctivae are normal. Pupils are equal, round, and reactive to light.  Neck: Normal range of motion. Neck supple. No JVD present.  Cardiovascular: Normal  rate, regular rhythm, S1 normal, S2 normal and intact distal pulses.  Exam reveals no gallop and no friction rub.   Murmur heard.  Crescendo systolic murmur is present with a grade of 2/6  Pulmonary/Chest: Effort normal. No respiratory distress. She has decreased breath sounds. She has no wheezes. She has no rales. She exhibits no tenderness.  Abdominal: Soft. Bowel sounds are normal. She exhibits no distension. There is no tenderness.  Musculoskeletal: Normal range of motion. She exhibits no edema and no tenderness.  Lymphadenopathy:    She has no cervical adenopathy.  Neurological: She is alert and oriented to person, place, and time. Coordination normal.  Skin: Skin is warm and dry. No rash noted. No erythema.  Psychiatric: She has a normal mood and affect. Her behavior is normal. Judgment and thought content normal.    Assessment and Plan

## 2013-09-28 NOTE — Patient Instructions (Signed)
You are doing well. No medication changes were made.  Continue your current medications Take extra lasix for worsening leg swelling, shortness of  breath, or weight >140 pounds  Please call us if you have new issues that need to be addressed before your next appt.  Your physician wants you to follow-up in: 3 months.  You will receive a reminder letter in the mail two months in advance. If you don't receive a letter, please call our office to schedule the follow-up appointment.

## 2013-09-28 NOTE — Assessment & Plan Note (Signed)
Cholesterol is at goal on the current lipid regimen. No changes to the medications were made.  

## 2013-10-04 ENCOUNTER — Encounter: Payer: Self-pay | Admitting: Pulmonary Disease

## 2013-10-12 ENCOUNTER — Other Ambulatory Visit: Payer: Self-pay

## 2013-10-12 DIAGNOSIS — H353190 Nonexudative age-related macular degeneration, unspecified eye, stage unspecified: Secondary | ICD-10-CM | POA: Diagnosis not present

## 2013-10-12 LAB — HM DIABETES EYE EXAM

## 2013-10-12 MED ORDER — LORAZEPAM 1 MG PO TABS: 1.0000 mg | ORAL_TABLET | Freq: Every day | ORAL | Status: DC

## 2013-10-12 NOTE — Telephone Encounter (Signed)
Melissa with [name] said pt only has 2 lorazepam left and request [facility] so pt will not be without med. Melissa said med tech was supposed to order but did not get done. Melissa request cb to verify has been taken care of today.

## 2013-10-12 NOTE — Telephone Encounter (Signed)
Plz send in as needed. Printed and placed in Kim's box.

## 2013-10-12 NOTE — Telephone Encounter (Signed)
Rx faxed and [facility] notified.

## 2013-10-15 LAB — HM DIABETES EYE EXAM

## 2013-10-17 ENCOUNTER — Ambulatory Visit (INDEPENDENT_AMBULATORY_CARE_PROVIDER_SITE_OTHER): Payer: Medicare Other | Admitting: Pulmonary Disease

## 2013-10-17 ENCOUNTER — Telehealth: Payer: Self-pay | Admitting: Pulmonary Disease

## 2013-10-17 ENCOUNTER — Encounter: Payer: Self-pay | Admitting: Pulmonary Disease

## 2013-10-17 VITALS — BP 122/64 | HR 73 | Ht 65.0 in | Wt 140.0 lb

## 2013-10-17 DIAGNOSIS — J9611 Chronic respiratory failure with hypoxia: Secondary | ICD-10-CM

## 2013-10-17 DIAGNOSIS — I2510 Atherosclerotic heart disease of native coronary artery without angina pectoris: Secondary | ICD-10-CM

## 2013-10-17 DIAGNOSIS — I5022 Chronic systolic (congestive) heart failure: Secondary | ICD-10-CM

## 2013-10-17 DIAGNOSIS — I5032 Chronic diastolic (congestive) heart failure: Secondary | ICD-10-CM | POA: Diagnosis not present

## 2013-10-17 DIAGNOSIS — J9610 Chronic respiratory failure, unspecified whether with hypoxia or hypercapnia: Secondary | ICD-10-CM | POA: Diagnosis not present

## 2013-10-17 DIAGNOSIS — I509 Heart failure, unspecified: Secondary | ICD-10-CM

## 2013-10-17 DIAGNOSIS — R0902 Hypoxemia: Secondary | ICD-10-CM

## 2013-10-17 NOTE — Patient Instructions (Addendum)
Take an extra dose of lasix every day until your weight is down to 135 pounds Keep using your oxygen 24 hours a day We will see you back here as needed

## 2013-10-17 NOTE — Progress Notes (Signed)
Subjective:    Patient ID: Christine Blanchard, female    DOB: 1928/06/25, 80 y.o.   MRN: [id number]  Synopsis: 80 y/o female with coronary artery disease and chronic angina who has been followed by [facility] in [facility] (by Dr. Lamonte Sakai) for years and [facility] in [place] since 02/2011 for chronic dyspnea and hypoxemic respiratory failure.  Objectively she has both restrictive and obstructive disease and low MIP and MEP. She also likely has some degree of chronic aspiration.  She has severe coronary artery disease with chronic angina.  HPI  10/17/2013 ROV> Christine Blanchard says that she has ben aching all over for the last few days.  She has had more trouble breathing at night and feels like "a rabbit" is lying on her chest.  She sleeps with her head propped up and sometimes gets in the recliner which helps.  This started about a week ago. She has been coughing more with a dry cough, no mucus production, no fevers or chills.  She notes night sweats sometimes at night as well.  Her blood pressure had been running fairly low at [employer] lately. Her weight today is 140lbs which is high for her.  She has noticed more ankle swelling lately.    Past Medical History  Diagnosis Date  . Hypertension   . Diabetes mellitus 2003    previously saw Dr. Elyse Hsu  . Hyperlipidemia   . History of chicken pox     has had shingles shot  . History of diverticulitis of colon 1991, 1992, 1994  . History of kidney stones 1952  . CVA (cerebral infarction)     after a stent, minimal R residual weakness, affects speech when tired, ?mild dysequilibrium  . Urinary incontinence     stress/urge, per D. Tannenbaum/[place]  . CAD (coronary artery disease)     severe, s/p 4 cardiac caths, minimally invasive CABG @[facility] , ICA stent 2004 then LAD stent 2005, MEDICAL MANAGEMENT ONLY, MAY USE REPEATED SL NITRO  . Breathing difficulty 2011    on O2 since 10/2009  . Syncope 2005    s/p w/u including CT scan and 24 hour Holter  .  GERD (gastroesophageal reflux disease) 2004  . Macular degeneration     s/p corneal transplants  . Benign essential tremor     toprol XL and remeron  . Herpes zoster ophthalmicus 2009    history  . Hearing loss of both ears 08/2010    audiology eval 08/2010 - mod sensorineural heaing loss, rec trial digital hearing aids and re eval yearly to monitor  . Vitamin D deficiency     on 50k units weekly  . Diastolic CHF 09/1015    grade 1, normal EF, mildly dilated LA, normal PA pressures  . Lumbar spinal stenosis     with severe spondylosis, s/p laminotomy  . Allergic rhinitis   . Chronic kidney disease     stage 3 kidney disease.  . Leg weakness, bilateral 2014    s/p neuro eval - thought multifactorial (diabetic neuropathy, lumbar stenosis, and chronic disease deconditioning).  Conservative management recommended  . Gait instability   . Wedge compression fracture of T10 vertebra 2015    by CXR     Review of Systems  Constitutional: Negative for fever, chills and unexpected weight change.  Respiratory: Positive for shortness of breath. Negative for cough and choking.   Cardiovascular: Negative for chest pain and leg swelling.       Objective:   Physical Exam  Filed Vitals:   10/17/13 0913  BP: 122/64  Pulse: 73  Height: 5\' 5"  (1.651 m)  Weight: 140 lb (63.504 kg)  SpO2: 98%  2L [place]  04/25/2013 Ambulated 563feet on 2L [place] and O2 remained >94%  Gen: well appearing, no acute distress HEENT: NCAT, EOMi, OP clear, PULM: crackles in bases, about 1/3 up bilaterally CV: RRR, slight systolic murmur, no JVD AB: ND, NT Ext: warm, significant pitting ankle edema, no clubbing, no cyanosis   2011 PFT's: FVC Value: 1.56 L/min Pred: 2.64 L/min % Pred: 59 %  FEV1 Value: 1.28 L Pred: 1.96 L % Pred: 65 %  FEV1/FVC Value: 82 % Pred: 74 % % Pred: 111 %  FEF 25-75 Value: 1.33 L/min Pred: 1.44 L/min % Pred: 92 %  MIP (-30mmHg) 40% pred, MEP 46% pred  11/2010 CTA cardiac (with lung  windows, my read): R base peripheral/subpleural scarring, scattered GGO greatest in left lower     Assessment & Plan:   Chronic hypoxemic respiratory failure This is multifactorial in nature. Some restrictive lung disease, some chronic aspiration, some CHF from time to time. There has never been convincing evidence of severe interstitial lung disease. Further, her oxygen needs have not worsened over the years.  Continue 2 L continuously.  Because she does not have evidence of a severe progressive lung disease I have asked that she follow up with me on an as-needed basis.  Chronic diastolic CHF (congestive heart failure) She is currently volume overloaded on physical exam today. Her legs are swollen and she has crackles in the bases of her lungs. Further, her weight is up to 140 pounds.  Plan: -Increase Lasix to 40 mg twice a day until her weight is down to 135 pounds -Check basic metabolic panel today for potassium level    Updated Medication List Outpatient Encounter Prescriptions as of 10/17/2013  Medication Sig  . aspirin EC 81 MG tablet Take 2 tablets (162 mg total) by mouth daily.  Marland Kitchen atorvastatin (LIPITOR) 20 MG tablet TAKE 1 TABLET NIGHTLY  . B-D ULTRAFINE III SHORT PEN 31G X 8 MM MISC USE AS DIRECTED  . Blood Glucose Monitoring Suppl (FREESTYLE LITE) DEVI Use as directed to check sugar Dx:250.40  . Casanthranol-Docusate Sodium 30-100 MG CAPS as needed.    . cetirizine (ZYRTEC ALLERGY) 10 MG tablet Take 1 tablet (10 mg total) by mouth daily.  . citalopram (CELEXA) 10 MG tablet Take 1 tablet (10 mg total) by mouth daily.  . clopidogrel (PLAVIX) 75 MG tablet TAKE 1 TABLET DAILY  . dimenhyDRINATE (DRAMAMINE) 50 MG tablet Take 50 mg by mouth at bedtime as needed.  . fenofibrate micronized (ANTARA) 130 MG capsule TAKE 1 CAPSULE DAILY  . fluticasone (FLONASE) 50 MCG/ACT nasal spray Place 2 sprays into both nostrils daily.  . furosemide (LASIX) 40 MG tablet TAKE 1 TABLET DAILY with  extra on Tu, Th, Sat  . glucose blood (FREESTYLE LITE) test strip Use to check sugar three times daily Dx: 250.40  . Insulin Glargine (LANTUS) 100 UNIT/ML Solostar Pen Inject 16 Units into the skin daily.  . Lancets (FREESTYLE) lancets Use to check sugar three times daily Dx: 250.40  . LORazepam (ATIVAN) 1 MG tablet Take 1 tablet (1 mg total) by mouth at bedtime. insomnia  . metFORMIN (GLUCOPHAGE-XR) 500 MG 24 hr tablet TAKE 2 TABLETS (1,000 MG TOTAL) AT BEDTIME  . metoprolol tartrate (LOPRESSOR) 25 MG tablet Take 1 tablet (25 mg total) by mouth 2 (two) times daily.  Marland Kitchen  metoprolol tartrate (LOPRESSOR) 25 MG tablet TAKE 1 TABLET TWO TIMES DAILY  . Multiple Vitamin (MULTIVITAMIN) capsule Take 1 capsule by mouth daily.    Marland Kitchen NITROSTAT 0.4 MG SL tablet DISSOLVE 1 TABLET UNDER THE TONGUE AS NEEDED  . potassium chloride SA (KLOR-CON M20) 20 MEQ tablet TAKE 1/2 (ONE-HALF) TABLET DAILY. TAKE 1 TABLET DAILY ONLY WHEN YOU TAKE LASIX  . prednisoLONE sodium phosphate (INFLAMASE FORTE) 1 % ophthalmic solution Place 1 drop into both eyes daily.   . RABEprazole (ACIPHEX) 20 MG tablet TAKE 1 TABLET DAILY  . RANEXA 1000 MG SR tablet TAKE 1 TABLET TWICE A DAY  . senna-docusate (SENOKOT-S) 8.6-50 MG per tablet Take 1 tablet by mouth as needed for mild constipation.  . sitaGLIPtin (JANUVIA) 100 MG tablet Take 1 tablet (100 mg total) by mouth daily.  . Vitamin D, Ergocalciferol, (DRISDOL) 50000 UNITS CAPS capsule TAKE 1 CAPSULE ONCE A WEEK  . ZETIA 10 MG tablet TAKE 1 TABLET DAILY

## 2013-10-17 NOTE — Assessment & Plan Note (Signed)
She is currently volume overloaded on physical exam today. Her legs are swollen and she has crackles in the bases of her lungs. Further, her weight is up to 140 pounds.  Plan: -Increase Lasix to 40 mg twice a day until her weight is down to 135 pounds -Check basic metabolic panel today for potassium level

## 2013-10-17 NOTE — Telephone Encounter (Signed)
Spoke with Lorell and notified that the lasix should be doubled until she gets down to 135 lb She verbalized understanding  Nothing further needed

## 2013-10-17 NOTE — Assessment & Plan Note (Signed)
This is multifactorial in nature. Some restrictive lung disease, some chronic aspiration, some CHF from time to time. There has never been convincing evidence of severe interstitial lung disease. Further, her oxygen needs have not worsened over the years.  Continue 2 L continuously.  Because she does not have evidence of a severe progressive lung disease I have asked that she follow up with me on an as-needed basis.

## 2013-10-18 LAB — BASIC METABOLIC PANEL
BUN: 14 mg/dL (ref 6–23)
CALCIUM: 9.1 mg/dL (ref 8.4–10.5)
CO2: 30 mEq/L (ref 19–32)
Chloride: 101 mEq/L (ref 96–112)
Creatinine, Ser: 1.1 mg/dL (ref 0.4–1.2)
GFR: 50.37 mL/min — AB (ref >60.00)
Glucose, Bld: 135 mg/dL — ABNORMAL HIGH (ref 70–99)
Potassium: 3.9 mEq/L (ref 3.5–5.1)
SODIUM: 139 meq/L (ref 135–145)

## 2013-10-18 NOTE — Progress Notes (Signed)
Quick Note:  LMOM TCB x1. ______ 

## 2013-10-19 ENCOUNTER — Telehealth: Payer: Self-pay | Admitting: Pulmonary Disease

## 2013-10-19 NOTE — Telephone Encounter (Signed)
Notes Recorded by Juanito Doom, MD on 10/18/2013 at 3:34 PM Christine Blanchard,  Please her know bloodwork was normal  Thanks, [employer] with pt and notified of results per Dr. Lake Bells. Pt verbalized understanding and denied any questions.

## 2013-10-19 NOTE — Progress Notes (Signed)
Quick Note:  Spoke with pt and notified of results per Dr. McQuaid. Pt verbalized understanding and denied any questions.  ______ 

## 2013-10-22 ENCOUNTER — Encounter: Payer: Self-pay | Admitting: Family Medicine

## 2013-10-26 ENCOUNTER — Other Ambulatory Visit: Payer: Self-pay | Admitting: Cardiovascular Disease

## 2013-10-29 ENCOUNTER — Encounter: Payer: Self-pay | Admitting: Family Medicine

## 2013-10-29 ENCOUNTER — Other Ambulatory Visit: Payer: Self-pay | Admitting: Family Medicine

## 2013-11-02 ENCOUNTER — Other Ambulatory Visit: Payer: Self-pay

## 2013-11-02 MED ORDER — FLUTICASONE PROPIONATE 50 MCG/ACT NA SUSP: 2.0000 | Freq: Every day | NASAL | Status: DC

## 2013-11-06 ENCOUNTER — Other Ambulatory Visit: Payer: Self-pay | Admitting: *Deleted

## 2013-11-14 ENCOUNTER — Other Ambulatory Visit: Payer: Self-pay | Admitting: Cardiovascular Disease

## 2013-11-14 NOTE — Telephone Encounter (Signed)
lmtcb

## 2013-11-14 NOTE — Telephone Encounter (Signed)
Lmtcb. Isosorbide is not on medication list also looks like was told to hold ov 06/2013.

## 2013-11-29 ENCOUNTER — Other Ambulatory Visit: Payer: Self-pay | Admitting: Family Medicine

## 2013-11-29 ENCOUNTER — Telehealth: Payer: Self-pay

## 2013-11-29 ENCOUNTER — Telehealth: Payer: Self-pay | Admitting: *Deleted

## 2013-11-29 ENCOUNTER — Ambulatory Visit (INDEPENDENT_AMBULATORY_CARE_PROVIDER_SITE_OTHER): Payer: Medicare Other | Admitting: Family Medicine

## 2013-11-29 ENCOUNTER — Encounter: Payer: Self-pay | Admitting: Family Medicine

## 2013-11-29 VITALS — BP 134/72 | HR 72 | Temp 97.7°F | Wt 139.8 lb

## 2013-11-29 DIAGNOSIS — F329 Major depressive disorder, single episode, unspecified: Secondary | ICD-10-CM

## 2013-11-29 DIAGNOSIS — F32A Depression, unspecified: Secondary | ICD-10-CM

## 2013-11-29 DIAGNOSIS — E1121 Type 2 diabetes mellitus with diabetic nephropathy: Secondary | ICD-10-CM | POA: Diagnosis not present

## 2013-11-29 DIAGNOSIS — I5032 Chronic diastolic (congestive) heart failure: Secondary | ICD-10-CM

## 2013-11-29 DIAGNOSIS — I10 Essential (primary) hypertension: Secondary | ICD-10-CM

## 2013-11-29 DIAGNOSIS — Z23 Encounter for immunization: Secondary | ICD-10-CM

## 2013-11-29 DIAGNOSIS — I2510 Atherosclerotic heart disease of native coronary artery without angina pectoris: Secondary | ICD-10-CM | POA: Diagnosis not present

## 2013-11-29 DIAGNOSIS — G4700 Insomnia, unspecified: Secondary | ICD-10-CM

## 2013-11-29 MED ORDER — TRAZODONE HCL 50 MG PO TABS: 25.0000 mg | ORAL_TABLET | Freq: Every evening | ORAL | Status: DC | PRN

## 2013-11-29 MED ORDER — DOCUSATE SODIUM 100 MG PO CAPS: 100.0000 mg | ORAL_CAPSULE | Freq: Every day | ORAL | Status: DC

## 2013-11-29 NOTE — Addendum Note (Signed)
Addended by: Marchia Bond on: 11/29/2013 04:17 PM   Modules accepted: Orders

## 2013-11-29 NOTE — Assessment & Plan Note (Signed)
Mild volume overload - tr pedal edema + L lung base crackles. rec extra lasix 40mg  x 2 days. Pt agrees with plan.

## 2013-11-29 NOTE — Assessment & Plan Note (Signed)
Well-controlled on current regimen. ?

## 2013-11-29 NOTE — Telephone Encounter (Signed)
Christine Blanchard spoke with them regarding this.

## 2013-11-29 NOTE — Assessment & Plan Note (Signed)
Not due for A1c yet. Reviewed log she brings from NH - overall stable readings. rtc 3 mo f/u visit.

## 2013-11-29 NOTE — Progress Notes (Signed)
Pre visit review using our clinic review tool, if applicable. No additional management support is needed unless otherwise documented below in the visit note. 

## 2013-11-29 NOTE — Progress Notes (Signed)
BP 134/72  Pulse 72  Temp(Src) 97.7 F (36.5 C) (Oral)  Wt 139 lb 12 oz (63.39 kg)   CC: 38mo f/u visit Subjective:    Patient ID: Christine Blanchard, female    DOB: [birth year], 80 y.o.   MRN: [id number]  HPI: PREZLEY QADIR is a 80 y.o. female presenting on 11/29/2013 for Follow-up   Released from pulm care. Recent recurrent shaking spells - we decreased celexa to 1/2 tablet daily (5mg ) - but pt never tried this. Worried depression may worsen. Less severe. Having small spells at night time - teeth shake, feels little pruritic pricks on skin. Did go to granddaughter's wedding and had a grand time.   Insomnia - more trouble recently despite lorazepam 1mg  nightly. Now not helping. Appetite ok.  DM - regularly does check sugars twice daily every morning.  90-110s fasting. Compliant with antihyperglycemic regimen which includes: metformin, lantus 16u and januvia 100mg  daily.  Denies low sugars or hypoglycemic symptoms.  Denies paresthesias. Last diabetic eye exam unsure.  Pneumovax: done.  Prevnar: done.  Lab Results  Component Value Date   HGBA1C 6.2 08/29/2013    Wt Readings from Last 3 Encounters:  11/29/13 139 lb 12 oz (63.39 kg)  10/17/13 140 lb (63.504 kg)  09/28/13 136 lb 8 oz (61.916 kg)  dry weight = 140lbs. On lasix 40mg  daily.  Relevant past medical, surgical, family and social history reviewed and updated as indicated.  Allergies and medications reviewed and updated. Current Outpatient Prescriptions on File Prior to Visit  Medication Sig  . aspirin EC 81 MG tablet Take 2 tablets (162 mg total) by mouth daily.  Marland Kitchen atorvastatin (LIPITOR) 20 MG tablet TAKE 1 TABLET NIGHTLY  . B-D ULTRAFINE III SHORT PEN 31G X 8 MM MISC USE AS DIRECTED  . Blood Glucose Monitoring Suppl (FREESTYLE LITE) DEVI Use as directed to check sugar Dx:250.40  . Casanthranol-Docusate Sodium 30-100 MG CAPS as needed.    . cetirizine (ZYRTEC ALLERGY) 10 MG tablet Take 1 tablet (10 mg total) by mouth daily.    . citalopram (CELEXA) 10 MG tablet Take 1 tablet (10 mg total) by mouth daily.  . clopidogrel (PLAVIX) 75 MG tablet TAKE 1 TABLET DAILY  . fenofibrate micronized (ANTARA) 130 MG capsule TAKE 1 CAPSULE DAILY  . fluticasone (FLONASE) 50 MCG/ACT nasal spray Place 2 sprays into both nostrils daily.  . furosemide (LASIX) 40 MG tablet TAKE 1 TABLET DAILY with extra on Tu, Th, Sat  . glucose blood (FREESTYLE LITE) test strip Use to check sugar three times daily Dx: 250.40  . Insulin Glargine (LANTUS) 100 UNIT/ML Solostar Pen Inject 16 Units into the skin daily.  . Lancets (FREESTYLE) lancets Use to check sugar three times daily Dx: 250.40  . LORazepam (ATIVAN) 1 MG tablet Take 1 tablet (1 mg total) by mouth at bedtime. insomnia  . metFORMIN (GLUCOPHAGE-XR) 500 MG 24 hr tablet TAKE 2 TABLETS (1,000 MG TOTAL) AT BEDTIME  . metoprolol tartrate (LOPRESSOR) 25 MG tablet TAKE 1 TABLET TWO TIMES DAILY  . Multiple Vitamin (MULTIVITAMIN) capsule Take 1 capsule by mouth daily.    Marland Kitchen NITROSTAT 0.4 MG SL tablet DISSOLVE 1 TABLET UNDER THE TONGUE AS NEEDED  . potassium chloride SA (KLOR-CON M20) 20 MEQ tablet TAKE 1/2 (ONE-HALF) TABLET DAILY. TAKE 1 TABLET DAILY ONLY WHEN YOU TAKE LASIX  . prednisoLONE sodium phosphate (INFLAMASE FORTE) 1 % ophthalmic solution Place 1 drop into both eyes daily.   . RABEprazole (ACIPHEX) 20 MG  tablet TAKE 1 TABLET DAILY  . RANEXA 1000 MG SR tablet TAKE 1 TABLET TWICE A DAY  . Vitamin D, Ergocalciferol, (DRISDOL) 50000 UNITS CAPS capsule TAKE 1 CAPSULE ONCE A WEEK  . ZETIA 10 MG tablet TAKE 1 TABLET DAILY  . dimenhyDRINATE (DRAMAMINE) 50 MG tablet Take 50 mg by mouth at bedtime as needed.  . senna-docusate (SENOKOT-S) 8.6-50 MG per tablet Take 1 tablet by mouth as needed for mild constipation.   No current facility-administered medications on file prior to visit.    Review of Systems Per HPI unless specifically indicated above    Objective:    BP 134/72  Pulse 72   Temp(Src) 97.7 F (36.5 C) (Oral)  Wt 139 lb 12 oz (63.39 kg)  Physical Exam  Nursing note and vitals reviewed. Constitutional: She appears well-developed and well-nourished. No distress.  With [place] in place at 2L  HENT:  Mouth/Throat: Oropharynx is clear and moist. No oropharyngeal exudate.  Neck: Normal range of motion. Neck supple.  Cardiovascular: Normal rate, regular rhythm, normal heart sounds and intact distal pulses.   No murmur heard. Pulmonary/Chest: Effort normal and breath sounds normal. No respiratory distress. She has no wheezes. She has no rales.  L bibasilar crackles  Musculoskeletal: She exhibits edema (tr pedal edema).  Compression stockings in place  Skin: Skin is warm and dry. No rash noted.   Results for orders placed in visit on 10/25/13  HM DIABETES EYE EXAM      Result Value Ref Range   HM Diabetic Eye Exam    No Retinopathy      Assessment & Plan:   Problem List Items Addressed This Visit   Well controlled type 2 diabetes mellitus with nephropathy - Primary     Not due for A1c yet. Reviewed log she brings from NH - overall stable readings. rtc 3 mo f/u visit.    Insomnia     Lorazepam not as effective as it was - anticipate dependence. Did not increase benzo. Instead, recommend trial of trazodone 25mg  nightly with option to increase to 50mg  nightly.    Essential hypertension     Well controlled on current regimen.    Depression     Continue celexa 10mg  daily, add on trazodone 1/2 tablet at night time.    Relevant Medications      traZODone (DESYREL) tablet   Chronic diastolic CHF (congestive heart failure)     Mild volume overload - tr pedal edema + L lung base crackles. rec extra lasix 40mg  x 2 days. Pt agrees with plan.     Other Visit Diagnoses   Need for influenza vaccination        Relevant Orders       Flu Vaccine QUAD 36+ mos PF IM (Fluarix Quad PF) (Completed)        Follow up plan: Return in about 3 months (around 03/01/2014), or  as needed, for follow up visit.

## 2013-11-29 NOTE — Assessment & Plan Note (Signed)
Continue celexa 10mg  daily, add on trazodone 1/2 tablet at night time.

## 2013-11-29 NOTE — Telephone Encounter (Signed)
[facility] left v/m requesting cb for verification of stool softener to be used. Estill Bamberg at [employer] request cb.

## 2013-11-29 NOTE — Patient Instructions (Addendum)
Ok to check sugars twice daily or as needed - check fasting and 2 hours after any meal. May try trazodone 25mg  nightly for sleep along with lorazepam nightly. Flu shot done today. Take 1 extra lasix pill for next 2 days. Return in 3 months for follow up.  Good to see you today, call us with questions.

## 2013-11-29 NOTE — Assessment & Plan Note (Signed)
Lorazepam not as effective as it was - anticipate dependence. Did not increase benzo. Instead, recommend trial of trazodone 25mg  nightly with option to increase to 50mg  nightly.

## 2013-11-29 NOTE — Telephone Encounter (Signed)
Form for diabetic testing supplies in your IN box for completion. 

## 2013-11-30 NOTE — Telephone Encounter (Signed)
Filled and placed in my out box. 

## 2013-12-31 ENCOUNTER — Ambulatory Visit (INDEPENDENT_AMBULATORY_CARE_PROVIDER_SITE_OTHER): Payer: Medicare Other | Admitting: Cardiovascular Disease

## 2013-12-31 ENCOUNTER — Encounter: Payer: Self-pay | Admitting: Cardiovascular Disease

## 2013-12-31 VITALS — BP 110/68 | HR 72 | Ht 65.0 in | Wt 140.5 lb

## 2013-12-31 DIAGNOSIS — I25810 Atherosclerosis of coronary artery bypass graft(s) without angina pectoris: Secondary | ICD-10-CM

## 2013-12-31 DIAGNOSIS — R0602 Shortness of breath: Secondary | ICD-10-CM

## 2013-12-31 DIAGNOSIS — J9611 Chronic respiratory failure with hypoxia: Secondary | ICD-10-CM | POA: Diagnosis not present

## 2013-12-31 DIAGNOSIS — I2510 Atherosclerotic heart disease of native coronary artery without angina pectoris: Secondary | ICD-10-CM

## 2013-12-31 DIAGNOSIS — I10 Essential (primary) hypertension: Secondary | ICD-10-CM

## 2013-12-31 DIAGNOSIS — E785 Hyperlipidemia, unspecified: Secondary | ICD-10-CM | POA: Diagnosis not present

## 2013-12-31 DIAGNOSIS — I5032 Chronic diastolic (congestive) heart failure: Secondary | ICD-10-CM

## 2013-12-31 NOTE — Assessment & Plan Note (Signed)
Encouraged her to stay on her Lipitor and zetia. Goal LDL less than 70

## 2013-12-31 NOTE — Patient Instructions (Signed)
For shortness of breath, Please continue the lasix 40 mg in the morning  Also take lasix 40 mg at 2 pm on Monday, Wednesday and Friday Hold the afternoon lasix for weight less than 135 pounds Please weight daily  Please call if weight does not trend down We would change your diuretic medication  Please call us if you have new issues that need to be addressed before your next appt.  Your physician wants you to follow-up in: 3 months.  You will receive a reminder letter in the mail two months in advance. If you don't receive a letter, please call our office to schedule the follow-up appointment.

## 2013-12-31 NOTE — Progress Notes (Signed)
Patient ID: Christine Blanchard, female    DOB: 04-13-1928, 80 y.o.   MRN: [id number]  HPI Comments: Christine Blanchard is a very pleasant 34 -year-old woman with history of coronary artery disease, bypass surgery in 2004, hyperlipidemia, PCI 6 months after her bypass, repeat PCI one year later (She does report having a stroke after her cardiac catheterization), diabetes, hypertension, spinal stenosis,  who currently lives in a nursing home in [place], who has chronic unsteady gait  with chronic shortness of breath on oxygen who presents for routine followup of her shortness of breath. Previous Pulmonary workup has revealed restrictive lung disease.  She currently lives at [facility]. She's had a prior cardiac CTA as detailed below  In followup today, she reports that her breathing is getting worse. Her weight has trended upwards from 136 in August 2015 up to 140.8 pounds on today's visit, approximately 5 pounds. At home she reports her weight is 138 pounds. She has worsening lower extremity edema. She is taking Lasix 40 mg daily. She reports that nobody is weighing her at the nursing home. She weighs herself. She's not been given extra Lasix in the afternoon has recommended No recent chest pain.  She does drink significant fluids in the daytime, recently trying to decrease her intake  EKG on today's visit shows normal sinus rhythm with rate 72 bpm, nonspecific ST abnormality  Other past medical issues/history Chronic nausea. Previous hospitalization for diverticulitis per the patient.  Previously was on stool softeners, does not taking this on a regular basis.  No falls or syncope.  No regular exercise program.  Denies any cough. On oxygen, level at 2 L even with ambulation.  Previous cardiac CTA in the past for atypical chest pain that showed: Patent SVG to D1 with poor distal runoff,  Occluded LIMA to LAD.  Distal LAD never visualized,  RCA and circumflex patient with multiple 50% or less calcfic  Lesions,  Poor distal runoff from stented IM/LAD and small D1 supplied by SVG  Previous  Echocardiogram was essentially normal with normal ejection fraction greater than 67%, diastolic dysfunction, normal right ventricular systolic pressures  Stress test showed no ischemia with ejection fraction greater than 55%   12/2011 In the hospital, total cholesterol 146, LDL 58, HDL 65   Outpatient Encounter Prescriptions as of 12/31/2013  Medication Sig  . aspirin EC 81 MG tablet Take 2 tablets (162 mg total) by mouth daily.  Marland Kitchen atorvastatin (LIPITOR) 20 MG tablet TAKE 1 TABLET NIGHTLY  . B-D ULTRAFINE III SHORT PEN 31G X 8 MM MISC USE AS DIRECTED  . Blood Glucose Monitoring Suppl (FREESTYLE LITE) DEVI Use as directed to check sugar Dx:250.40  . Casanthranol-Docusate Sodium 30-100 MG CAPS as needed.    . cetirizine (ZYRTEC ALLERGY) 10 MG tablet Take 1 tablet (10 mg total) by mouth daily.  . citalopram (CELEXA) 10 MG tablet Take 1 tablet (10 mg total) by mouth daily.  . clopidogrel (PLAVIX) 75 MG tablet TAKE 1 TABLET DAILY  . dimenhyDRINATE (DRAMAMINE) 50 MG tablet Take 50 mg by mouth at bedtime as needed.  . docusate sodium (COLACE) 100 MG capsule Take 1 capsule (100 mg total) by mouth daily.  . fenofibrate micronized (ANTARA) 130 MG capsule TAKE 1 CAPSULE DAILY  . fluticasone (FLONASE) 50 MCG/ACT nasal spray Place 2 sprays into both nostrils daily.  . furosemide (LASIX) 40 MG tablet TAKE 1 TABLET DAILY  . glucose blood (FREESTYLE LITE) test strip Use to check sugar three times  daily Dx: 250.40  . Insulin Glargine (LANTUS) 100 UNIT/ML Solostar Pen Inject 16 Units into the skin daily.  Marland Kitchen JANUVIA 100 MG tablet TAKE 1 TABLET DAILY  . Lancets (FREESTYLE) lancets Use to check sugar three times daily Dx: 250.40  . LORazepam (ATIVAN) 1 MG tablet Take 1 tablet (1 mg total) by mouth at bedtime. insomnia  . metFORMIN (GLUCOPHAGE-XR) 500 MG 24 hr tablet TAKE 2 TABLETS (1,000 MG TOTAL) AT BEDTIME  .  metoprolol tartrate (LOPRESSOR) 25 MG tablet TAKE 1 TABLET TWO TIMES DAILY  . Multiple Vitamin (MULTIVITAMIN) capsule Take 1 capsule by mouth daily.    Marland Kitchen NITROSTAT 0.4 MG SL tablet DISSOLVE 1 TABLET UNDER THE TONGUE AS NEEDED  . potassium chloride SA (KLOR-CON M20) 20 MEQ tablet TAKE 1/2 (ONE-HALF) TABLET DAILY. TAKE 1 TABLET DAILY ONLY WHEN YOU TAKE LASIX  . prednisoLONE sodium phosphate (INFLAMASE FORTE) 1 % ophthalmic solution Place 1 drop into both eyes daily.   . RABEprazole (ACIPHEX) 20 MG tablet TAKE 1 TABLET DAILY  . RANEXA 1000 MG SR tablet TAKE 1 TABLET TWICE A DAY  . senna-docusate (SENOKOT-S) 8.6-50 MG per tablet Take 1 tablet by mouth as needed for mild constipation.  . traZODone (DESYREL) 50 MG tablet Take 0.5-1 tablets (25-50 mg total) by mouth at bedtime as needed for sleep.  . Vitamin D, Ergocalciferol, (DRISDOL) 50000 UNITS CAPS capsule TAKE 1 CAPSULE ONCE A WEEK  . ZETIA 10 MG tablet TAKE 1 TABLET DAILY   Social history  reports that she has never smoked. She has never used smokeless tobacco. She reports that she does not drink alcohol or use illicit drugs.  Review of Systems  Constitutional: Negative.   Respiratory: Positive for shortness of breath.   Cardiovascular: Positive for leg swelling.  Gastrointestinal: Negative.   Musculoskeletal: Negative.   Neurological: Negative.   Hematological: Negative.   Psychiatric/Behavioral: Negative.   All other systems reviewed and are negative.   BP 110/68 mmHg  Pulse 72  Ht 5\' 5"  (1.651 m)  Wt 140 lb 8 oz (63.73 kg)  BMI 23.38 kg/m2  Physical Exam  Constitutional: She is oriented to person, place, and time. She appears well-developed and well-nourished.  HENT:  Head: Normocephalic.  Nose: Nose normal.  Mouth/Throat: Oropharynx is clear and moist.  Eyes: Conjunctivae are normal. Pupils are equal, round, and reactive to light.  Neck: Normal range of motion. Neck supple. No JVD present.  Cardiovascular: Normal rate,  regular rhythm, S1 normal, S2 normal and intact distal pulses.  Exam reveals no gallop and no friction rub.   Murmur heard.  Crescendo systolic murmur is present with a grade of 2/6  Pulmonary/Chest: Effort normal. No respiratory distress. She has decreased breath sounds. She has no wheezes. She has rales. She exhibits no tenderness.  Abdominal: Soft. Bowel sounds are normal. She exhibits no distension. There is no tenderness.  Musculoskeletal: Normal range of motion. She exhibits no edema or tenderness.  Lymphadenopathy:    She has no cervical adenopathy.  Neurological: She is alert and oriented to person, place, and time. Coordination normal.  Skin: Skin is warm and dry. No rash noted. No erythema.  Psychiatric: She has a normal mood and affect. Her behavior is normal. Judgment and thought content normal.    Assessment and Plan  Nursing note and vitals reviewed.

## 2013-12-31 NOTE — Assessment & Plan Note (Signed)
5 pound weight gain likely secondary to diastolic CHF. Recommended she take additional Lasix 40 mg 3 days per week in the afternoon in addition to her morning dose on a regular basis. She will monitor her weight closely and if weight does not start trending downwards, we have recommended that she call our office. Also recommended that she decrease her fluid intake

## 2013-12-31 NOTE — Assessment & Plan Note (Signed)
Currently with no symptoms of angina. No further workup at this time. Continue current medication regimen. 

## 2013-12-31 NOTE — Assessment & Plan Note (Signed)
No symptoms concerning for angina. No further testing at this time

## 2013-12-31 NOTE — Assessment & Plan Note (Signed)
She is stable on her oxygen, 2 L nasal cannula

## 2013-12-31 NOTE — Assessment & Plan Note (Signed)
As before, multifactorial from underlying lung disease, diastolic CHF. Weight gain likely from fluid retention on today's visit with worsening symptoms. Lasix changes as above

## 2013-12-31 NOTE — Assessment & Plan Note (Signed)
Blood pressure is well controlled on today's visit. No changes made to the medications. 

## 2014-01-05 ENCOUNTER — Encounter: Payer: Self-pay | Admitting: Family Medicine

## 2014-01-08 MED ORDER — FREESTYLE SYSTEM KIT: 1.0000 | PACK | Status: DC | PRN

## 2014-01-16 ENCOUNTER — Telehealth: Payer: Self-pay

## 2014-01-16 NOTE — Telephone Encounter (Signed)
Spoke w/ pt.  She reports that her wt at last ov on 12/31/13 was 140. Reports her wt has not changed, 139.8 today. Reports she has been following directions from last ov:  "Also take lasix 40 mg at 2 pm on Monday, Wednesday and Friday Hold the afternoon lasix for weight less than 135 pounds Please weight daily  Please call if weight does not trend down We would change your diuretic medication"  Reports that she "tries" to limit salt, she has been limiting her fluids to 6 cups per day, she keeps her feet elevated when she can and she has been wearing her compression hose daily.  She states that she feels horrible and that her nurses suggested the lasix is not working for her anymore.  Please advise.  Thank you.

## 2014-01-16 NOTE — Telephone Encounter (Signed)
Pt states her weight is 139. States she thinks she needs a medication adjustment.States she feels "horrible".

## 2014-01-18 ENCOUNTER — Encounter: Payer: Self-pay | Admitting: Cardiovascular Disease

## 2014-01-18 NOTE — Telephone Encounter (Signed)
Left detailed message on pt's vm w/ Dr. Donivan Scull recommendation.  Also sent MyChart message. Asked pt to call on Monday if her sx have not improved.

## 2014-01-18 NOTE — Telephone Encounter (Signed)
Recommend she start Lasix 40 mg twice a day If she continues to have no improvement We could change her to torsemide 20 mg twice a day next week

## 2014-01-22 ENCOUNTER — Encounter: Payer: Self-pay | Admitting: Cardiovascular Disease

## 2014-01-22 MED ORDER — TORSEMIDE 20 MG PO TABS: 40.0000 mg | ORAL_TABLET | Freq: Every day | ORAL | Status: DC

## 2014-01-22 NOTE — Telephone Encounter (Signed)
Asked pt to call back w/ any questions or concerns.

## 2014-01-22 NOTE — Telephone Encounter (Signed)
Spoke w/ Dr. Rockey Situ.  He recommends that pt start out w/ torsemide 40 mg QAM and if wt comes down and pt feels better.  Left detailed message on pt's vm that I am sending ths

## 2014-01-22 NOTE — Addendum Note (Signed)
Addended by: Dede Query R on: 01/22/2014 05:30 PM   Modules accepted: Orders

## 2014-01-22 NOTE — Telephone Encounter (Signed)
Received MyChart message from pt:  Christine Blanchard , I am sending this on a Monday nite. I have been following your instruction on. Every thing except the salt. .have little control over that. Still feeling lousy. Weight has only gone down 1 lb. since I was in your office.. With the holidays coming up do 1 just keeping on with the Lazix 2 times a day?Bad time to be bothering you folks.. Sorry    ----- Message -----

## 2014-01-27 ENCOUNTER — Encounter: Payer: Self-pay | Admitting: Cardiovascular Disease

## 2014-01-29 ENCOUNTER — Other Ambulatory Visit: Payer: Self-pay

## 2014-01-29 MED ORDER — TORSEMIDE 20 MG PO TABS: 40.0000 mg | ORAL_TABLET | ORAL | Status: DC

## 2014-01-30 ENCOUNTER — Other Ambulatory Visit: Payer: Self-pay

## 2014-01-30 ENCOUNTER — Other Ambulatory Visit: Payer: Self-pay | Admitting: *Deleted

## 2014-01-30 ENCOUNTER — Encounter: Payer: Self-pay | Admitting: Family Medicine

## 2014-01-30 MED ORDER — TRAZODONE HCL 50 MG PO TABS: 25.0000 mg | ORAL_TABLET | Freq: Every evening | ORAL | Status: DC | PRN

## 2014-01-30 MED ORDER — TORSEMIDE 20 MG PO TABS: 40.0000 mg | ORAL_TABLET | ORAL | Status: DC

## 2014-01-30 MED ORDER — LORAZEPAM 1 MG PO TABS: 1.0000 mg | ORAL_TABLET | Freq: Every day | ORAL | Status: DC

## 2014-01-30 MED ORDER — FUROSEMIDE 40 MG PO TABS: 40.0000 mg | ORAL_TABLET | Freq: Every day | ORAL | Status: DC

## 2014-01-30 NOTE — Telephone Encounter (Signed)
plz phone in. 

## 2014-01-30 NOTE — Telephone Encounter (Signed)
Ok to refill 

## 2014-01-30 NOTE — Telephone Encounter (Signed)
Refill sent for furosemide  

## 2014-01-30 NOTE — Telephone Encounter (Signed)
Refill sent for 90 day supply. 

## 2014-01-30 NOTE — Telephone Encounter (Signed)
Rx called in as directed.   

## 2014-02-05 ENCOUNTER — Other Ambulatory Visit: Payer: Self-pay | Admitting: Family Medicine

## 2014-02-05 NOTE — Telephone Encounter (Signed)
Do you want patient to continue this? If so, will need refill. Thanks!

## 2014-02-11 ENCOUNTER — Other Ambulatory Visit: Payer: Self-pay | Admitting: Family Medicine

## 2014-02-11 ENCOUNTER — Ambulatory Visit (INDEPENDENT_AMBULATORY_CARE_PROVIDER_SITE_OTHER): Payer: Medicare Other | Admitting: Cardiovascular Disease

## 2014-02-11 ENCOUNTER — Telehealth: Payer: Self-pay

## 2014-02-11 ENCOUNTER — Encounter: Payer: Self-pay | Admitting: Cardiovascular Disease

## 2014-02-11 VITALS — BP 118/60 | HR 70 | Ht 65.0 in | Wt 139.5 lb

## 2014-02-11 DIAGNOSIS — R0602 Shortness of breath: Secondary | ICD-10-CM

## 2014-02-11 DIAGNOSIS — I25810 Atherosclerosis of coronary artery bypass graft(s) without angina pectoris: Secondary | ICD-10-CM

## 2014-02-11 DIAGNOSIS — R0789 Other chest pain: Secondary | ICD-10-CM

## 2014-02-11 DIAGNOSIS — I10 Essential (primary) hypertension: Secondary | ICD-10-CM

## 2014-02-11 DIAGNOSIS — E785 Hyperlipidemia, unspecified: Secondary | ICD-10-CM

## 2014-02-11 DIAGNOSIS — I5032 Chronic diastolic (congestive) heart failure: Secondary | ICD-10-CM

## 2014-02-11 DIAGNOSIS — N183 Chronic kidney disease, stage 3 unspecified: Secondary | ICD-10-CM

## 2014-02-11 MED ORDER — TORSEMIDE 20 MG PO TABS: 40.0000 mg | ORAL_TABLET | Freq: Two times a day (BID) | ORAL | Status: DC

## 2014-02-11 NOTE — Assessment & Plan Note (Signed)
Suggested she continue with her Lipitor and fenofibrate

## 2014-02-11 NOTE — Telephone Encounter (Signed)
Spoke w/ Cristie Hem, RN at [facility].  She asks that pt's AVS be revised to exclude "as needed" for pt's torsemide, as they cannot give meds on a prn basis. Advised her that I will fax revised AVS to her at [phone].

## 2014-02-11 NOTE — Patient Instructions (Addendum)
You are doing well.  Please increase the torsemide up to 40 mg twice a day as needed for leg swelling and weight greater than 135 pounds If leg swelling is better, and shortness of breath is better, hold the afternoon torsemide  Please call us if you have new issues that need to be addressed before your next appt.  Your physician wants you to follow-up in: 3 months.  You will receive a reminder letter in the mail two months in advance. If you don't receive a letter, please call our office to schedule the follow-up appointment.

## 2014-02-11 NOTE — Telephone Encounter (Signed)
Nurse with [facility] called, has a question regarding pt Torsemide. Please call.

## 2014-02-11 NOTE — Assessment & Plan Note (Signed)
Currently with no symptoms of angina. No further workup at this time. Continue current medication regimen. 

## 2014-02-11 NOTE — Progress Notes (Signed)
Patient ID: Christine Blanchard, female    DOB: 01-Jul-1928, 81 y.o.   MRN: [id number]  HPI Comments: Christine Blanchard is a very pleasant 37 -year-old woman with history of coronary artery disease, bypass surgery in 2004, hyperlipidemia, PCI 6 months after her bypass, repeat PCI one year later (She does report having a stroke after her cardiac catheterization), diabetes, hypertension, spinal stenosis,  who currently lives in a nursing home in [place], who has chronic unsteady gait  with chronic shortness of breath on oxygen who presents for routine followup of her shortness of breath. Previous Pulmonary workup has revealed restrictive lung disease.  She currently lives at [facility]. She's had a prior cardiac CTA as detailed below  Since her last clinic visit, we changed her from Lasix to torsemide 40 mg in the morning On this regimen she has had only 1 pound weight loss and continues to have leg edema, feels bloated, shortness of breath consistent with diastolic CHF. She is requesting more aggressive diuresis. She does drink significant fluids in the daytime but has been trying to cut back. Previously took Lasix in the morning, had not been receiving Lasix in the afternoon and after she tried this, did not notice any improvement She wears compression hose daily She feels her dry weight is 135 pounds  EKG on today's visit shows normal sinus rhythm with rate 70 bpm, nonspecific ST abnormality  Other past medical issues/history Chronic nausea. Previous hospitalization for diverticulitis per the patient.  Previously was on stool softeners, does not taking this on a regular basis.  No falls or syncope.  No regular exercise program.  Denies any cough. On oxygen, level at 2 L even with ambulation.  Previous cardiac CTA in the past for atypical chest pain that showed: Patent SVG to D1 with poor distal runoff,  Occluded LIMA to LAD.  Distal LAD never visualized,  RCA and circumflex patient with multiple 50% or  less calcfic Lesions,  Poor distal runoff from stented IM/LAD and small D1 supplied by SVG  Previous  Echocardiogram was essentially normal with normal ejection fraction greater than 71%, diastolic dysfunction, normal right ventricular systolic pressures  Stress test showed no ischemia with ejection fraction greater than 55%   12/2011 In the hospital, total cholesterol 146, LDL 58, HDL 65  Allergies  Allergen Reactions  . Actos [Pioglitazone Hydrochloride] Shortness Of Breath    CHF  . Atenolol Other (See Comments)    lethargy  . Codeine     REACTION: vomiting  . Eszopiclone Other (See Comments)    Nightmares  . Exenatide Nausea Only  . Morphine     REACTION: nausea  . Sulfa Antibiotics   . Vicodin [Hydrocodone-Acetaminophen] Nausea And Vomiting    Outpatient Encounter Prescriptions as of 02/11/2014  Medication Sig  . aspirin EC 81 MG tablet Take 2 tablets (162 mg total) by mouth daily.  Marland Kitchen atorvastatin (LIPITOR) 20 MG tablet TAKE 1 TABLET NIGHTLY  . atorvastatin (LIPITOR) 20 MG tablet TAKE 1 TABLET NIGHTLY  . B-D ULTRAFINE III SHORT PEN 31G X 8 MM MISC USE AS DIRECTED  . Casanthranol-Docusate Sodium 30-100 MG CAPS as needed.    . cetirizine (ZYRTEC ALLERGY) 10 MG tablet Take 1 tablet (10 mg total) by mouth daily.  . citalopram (CELEXA) 10 MG tablet Take 1 tablet (10 mg total) by mouth daily.  . clopidogrel (PLAVIX) 75 MG tablet TAKE 1 TABLET DAILY  . dimenhyDRINATE (DRAMAMINE) 50 MG tablet Take 50 mg by mouth at  bedtime as needed.  . docusate sodium (COLACE) 100 MG capsule Take 1 capsule (100 mg total) by mouth daily.  . fenofibrate micronized (ANTARA) 130 MG capsule TAKE 1 CAPSULE DAILY  . fluticasone (FLONASE) 50 MCG/ACT nasal spray Place 2 sprays into both nostrils daily.  Marland Kitchen glucose blood (FREESTYLE LITE) test strip Use to check sugar three times daily Dx: 250.40  . glucose monitoring kit (FREESTYLE) monitoring kit 1 each by Does not apply route as needed for other.  .  Insulin Glargine (LANTUS) 100 UNIT/ML Solostar Pen Inject 16 Units into the skin daily.  Marland Kitchen JANUVIA 100 MG tablet TAKE 1 TABLET DAILY  . Lancets (FREESTYLE) lancets Use to check sugar three times daily Dx: 250.40  . LORazepam (ATIVAN) 1 MG tablet Take 1 tablet (1 mg total) by mouth at bedtime. insomnia  . metFORMIN (GLUCOPHAGE-XR) 500 MG 24 hr tablet TAKE 2 TABLETS AT BEDTIME  . metoprolol tartrate (LOPRESSOR) 25 MG tablet TAKE 1 TABLET TWO TIMES DAILY  . Multiple Vitamin (MULTIVITAMIN) capsule Take 1 capsule by mouth daily.    Marland Kitchen NITROSTAT 0.4 MG SL tablet DISSOLVE 1 TABLET UNDER THE TONGUE AS NEEDED  . potassium chloride SA (K-DUR,KLOR-CON) 20 MEQ tablet Take 20 mEq by mouth daily.  . prednisoLONE sodium phosphate (INFLAMASE FORTE) 1 % ophthalmic solution Place 1 drop into both eyes daily.   . RABEprazole (ACIPHEX) 20 MG tablet TAKE 1 TABLET DAILY  . RANEXA 1000 MG SR tablet TAKE 1 TABLET TWICE A DAY  . senna-docusate (SENOKOT-S) 8.6-50 MG per tablet Take 1 tablet by mouth as needed for mild constipation.  . torsemide (DEMADEX) 20 MG tablet Take 2 tablets (40 mg total) by mouth 2 (two) times daily.  . traZODone (DESYREL) 50 MG tablet Take 0.5-1 tablets (25-50 mg total) by mouth at bedtime as needed for sleep.  . Vitamin D, Ergocalciferol, (DRISDOL) 50000 UNITS CAPS capsule TAKE 1 CAPSULE ONCE EVERY WEEK  . ZETIA 10 MG tablet TAKE 1 TABLET DAILY  . [DISCONTINUED] torsemide (DEMADEX) 20 MG tablet Take 2 tablets (40 mg total) by mouth every morning.  . [DISCONTINUED] furosemide (LASIX) 40 MG tablet Take 1 tablet (40 mg total) by mouth daily. (Patient not taking: Reported on 02/11/2014)  . [DISCONTINUED] potassium chloride SA (KLOR-CON M20) 20 MEQ tablet TAKE 1/2 (ONE-HALF) TABLET DAILY. TAKE 1 TABLET DAILY ONLY WHEN YOU TAKE LASIX (Patient not taking: Reported on 02/11/2014)    Past Medical History  Diagnosis Date  . Hypertension   . Diabetes mellitus 2003    previously saw Dr. Elyse Hsu  .  Hyperlipidemia   . History of chicken pox     has had shingles shot  . History of diverticulitis of colon 1991, 1992, 1994  . History of kidney stones 1952  . CVA (cerebral infarction)     after a stent, minimal R residual weakness, affects speech when tired, ?mild dysequilibrium  . Urinary incontinence     stress/urge, per D. Tannenbaum/[place]  . CAD (coronary artery disease)     severe, s/p 4 cardiac caths, minimally invasive CABG @[facility] , ICA stent 2004 then LAD stent 2005, MEDICAL MANAGEMENT ONLY, MAY USE REPEATED SL NITRO  . Breathing difficulty 2011    on O2 since 10/2009  . Syncope 2005    s/p w/u including CT scan and 24 hour Holter  . GERD (gastroesophageal reflux disease) 2004  . Dry senile macular degeneration     s/p corneal transplants ( [facility])  . Benign essential tremor  toprol XL and remeron  . Herpes zoster ophthalmicus 2009    history  . Hearing loss of both ears 08/2010    audiology eval 08/2010 - mod sensorineural heaing loss, rec trial digital hearing aids and re eval yearly to monitor  . Vitamin D deficiency     on 50k units weekly  . Diastolic CHF 03/5636    grade 1, normal EF, mildly dilated LA, normal PA pressures  . Lumbar spinal stenosis     with severe spondylosis, s/p laminotomy  . Allergic rhinitis   . Chronic kidney disease     stage 3 kidney disease.  . Leg weakness, bilateral 2014    s/p neuro eval - thought multifactorial (diabetic neuropathy, lumbar stenosis, and chronic disease deconditioning).  Conservative management recommended  . Gait instability   . Wedge compression fracture of T10 vertebra 2015    by CXR    Past Surgical History  Procedure Laterality Date  . Cholecystectomy  1985  . Cervical fusion  2002    anterior decompression and fusion C6/7  . Coronary artery bypass graft  2004  . Percutaneous coronary stent intervention (pci-s)  2005    stents x 2 (intermediate and LAD), 2nd complicated by stroke  . Corneal transplant       x3  . Appendectomy  1948  . Breast biopsy  2001    x2, both benign, last mammo 2011, last pap smear 2011  . Cardiac catheterization  2009    x5  . Cataract extraction      bilateral, corneal dystrophy  . Cardiovascular stress test  2010    nuclear, normal  . Dexa  2005    normal  . Lumbar spine surgery  2010    laminotomy (L2/3, 3/4)    Social History  reports that she has never smoked. She has never used smokeless tobacco. She reports that she does not drink alcohol or use illicit drugs.  Family History family history includes Cancer in her brother and mother; Heart disease in her father.   Review of Systems  Constitutional: Negative.   Respiratory: Positive for shortness of breath.   Cardiovascular: Positive for leg swelling.  Gastrointestinal: Negative.   Musculoskeletal: Negative.   Neurological: Negative.   Hematological: Negative.   Psychiatric/Behavioral: Negative.   All other systems reviewed and are negative.   BP 118/60 mmHg  Pulse 70  Ht 5' 5"  (1.651 m)  Wt 139 lb 8 oz (63.277 kg)  BMI 23.21 kg/m2  Physical Exam  Constitutional: She is oriented to person, place, and time. She appears well-developed and well-nourished.  HENT:  Head: Normocephalic.  Nose: Nose normal.  Mouth/Throat: Oropharynx is clear and moist.  Eyes: Conjunctivae are normal. Pupils are equal, round, and reactive to light.  Neck: Normal range of motion. Neck supple. No JVD present.  Cardiovascular: Normal rate, regular rhythm, S1 normal, S2 normal and intact distal pulses.  Exam reveals no gallop and no friction rub.   Murmur heard.  Crescendo systolic murmur is present with a grade of 2/6  Pulmonary/Chest: Effort normal. No respiratory distress. She has decreased breath sounds. She has no wheezes. She has rales. She exhibits no tenderness.  Abdominal: Soft. Bowel sounds are normal. She exhibits no distension. There is no tenderness.  Musculoskeletal: Normal range of motion. She  exhibits no edema or tenderness.  Lymphadenopathy:    She has no cervical adenopathy.  Neurological: She is alert and oriented to person, place, and time. Coordination normal.  Skin:  Skin is warm and dry. No rash noted. No erythema.  Psychiatric: She has a normal mood and affect. Her behavior is normal. Judgment and thought content normal.    Assessment and Plan  Nursing note and vitals reviewed.

## 2014-02-11 NOTE — Assessment & Plan Note (Signed)
Blood pressure is well controlled on today's visit. No changes made to the medications. 

## 2014-02-11 NOTE — Assessment & Plan Note (Signed)
She reports continued fluid retention. We will increase her torsemide up to 40 mg twice a day for several days then every other day, continuing on 40 mg daily at baseline. If no improvement, could consider repeat echocardiogram, possible ischemia workup. I suspect much of her shortness of breath is secondary to underlying lung disease.

## 2014-02-11 NOTE — Assessment & Plan Note (Signed)
We'll have to proceed with aggressive diuresis carefully. She has follow-up with primary care at the end of the month. Would recommend repeat BMP at that time. If there is a climb in her creatinine, we'll need to back down on the diuretics, likely back to torsemide 40 mg daily

## 2014-02-11 NOTE — Assessment & Plan Note (Signed)
Shortness of breath likely multifactorial though predominantly from restrictive and obstructive lung disease, deconditioning. Some component of chronic diastolic CHF. We'll continue aggressive diuresis with close monitoring of her weight and her lab work

## 2014-02-18 ENCOUNTER — Other Ambulatory Visit: Payer: Self-pay | Admitting: *Deleted

## 2014-02-18 MED ORDER — INSULIN PEN NEEDLE 30G X 5 MM MISC: Status: DC

## 2014-02-20 ENCOUNTER — Encounter: Payer: Self-pay | Admitting: Family Medicine

## 2014-02-23 ENCOUNTER — Other Ambulatory Visit: Payer: Self-pay | Admitting: Family Medicine

## 2014-03-01 ENCOUNTER — Ambulatory Visit (INDEPENDENT_AMBULATORY_CARE_PROVIDER_SITE_OTHER): Payer: Medicare Other | Admitting: Family Medicine

## 2014-03-01 ENCOUNTER — Encounter: Payer: Self-pay | Admitting: Family Medicine

## 2014-03-01 VITALS — BP 116/60 | HR 76 | Temp 97.4°F | Wt 140.0 lb

## 2014-03-01 DIAGNOSIS — I10 Essential (primary) hypertension: Secondary | ICD-10-CM | POA: Diagnosis not present

## 2014-03-01 DIAGNOSIS — F329 Major depressive disorder, single episode, unspecified: Secondary | ICD-10-CM

## 2014-03-01 DIAGNOSIS — F32A Depression, unspecified: Secondary | ICD-10-CM

## 2014-03-01 DIAGNOSIS — I5032 Chronic diastolic (congestive) heart failure: Secondary | ICD-10-CM

## 2014-03-01 DIAGNOSIS — IMO0001 Reserved for inherently not codable concepts without codable children: Secondary | ICD-10-CM

## 2014-03-01 DIAGNOSIS — I25810 Atherosclerosis of coronary artery bypass graft(s) without angina pectoris: Secondary | ICD-10-CM | POA: Diagnosis not present

## 2014-03-01 DIAGNOSIS — J9611 Chronic respiratory failure with hypoxia: Secondary | ICD-10-CM

## 2014-03-01 DIAGNOSIS — N183 Chronic kidney disease, stage 3 unspecified: Secondary | ICD-10-CM

## 2014-03-01 DIAGNOSIS — I2510 Atherosclerotic heart disease of native coronary artery without angina pectoris: Secondary | ICD-10-CM | POA: Diagnosis not present

## 2014-03-01 DIAGNOSIS — E1121 Type 2 diabetes mellitus with diabetic nephropathy: Secondary | ICD-10-CM

## 2014-03-01 DIAGNOSIS — G4700 Insomnia, unspecified: Secondary | ICD-10-CM | POA: Diagnosis not present

## 2014-03-01 DIAGNOSIS — R251 Tremor, unspecified: Secondary | ICD-10-CM

## 2014-03-01 LAB — RENAL FUNCTION PANEL
Albumin: 4.1 g/dL (ref 3.5–5.2)
BUN: 20 mg/dL (ref 6–23)
CALCIUM: 9.7 mg/dL (ref 8.4–10.5)
CO2: 32 mEq/L (ref 19–32)
Chloride: 99 mEq/L (ref 96–112)
Creatinine, Ser: 1.13 mg/dL (ref 0.40–1.20)
GFR: 48.78 mL/min — ABNORMAL LOW (ref >60.00)
Glucose, Bld: 100 mg/dL — ABNORMAL HIGH (ref 70–99)
PHOSPHORUS: 3.5 mg/dL (ref 2.3–4.6)
Potassium: 3.9 mEq/L (ref 3.5–5.1)
SODIUM: 139 meq/L (ref 135–145)

## 2014-03-01 LAB — HEMOGLOBIN A1C: Hgb A1c MFr Bld: 6.5 % (ref 4.6–6.5)

## 2014-03-01 NOTE — Assessment & Plan Note (Signed)
Denies angina.

## 2014-03-01 NOTE — Patient Instructions (Signed)
Good to see you today. continue meds as prescribed. We will call you with labwork results and plan on potassium and diabetes medications. Foot exam today.

## 2014-03-01 NOTE — Assessment & Plan Note (Signed)
Would consider increased trazodone to 50mg  nightly.

## 2014-03-01 NOTE — Progress Notes (Signed)
BP 116/60 mmHg  Pulse 76  Temp(Src) 97.4 F (36.3 C) (Oral)  Wt 140 lb (63.504 kg)   CC: 3 mo f/u visit  Subjective:    Patient ID: Christine Blanchard, female    DOB: 05-06-1928, 81 y.o.   MRN: [id number]  HPI: Christine Blanchard is a 81 y.o. female presenting on 03/01/2014 for Follow-up   Had a shaking spell last night. Didn't rest well last night. Thinks this was due to only sleeping 1 hour the night before.   Insomnia - last visit we started trazodone nightly prn along with lorazepam 42m dose. Pt taking 258mnightly. Intermittent trouble sleeping despite meds.  Diastolic CHF s/p bypass and PCI - recently furosemide changed to torsemide 2059m tablets bid, down to 46m71min am only when weight dropped to 135lb. She notices she breathes easier when weight closer to 135lbs which may be her ideal dry weight. If torsemide stops working may need IV diuretic.   DM - regularly does check sugars twice daily every morning. 80s-140 fasting, up to 200s post prandial. Compliant with antihyperglycemic regimen which includes: metformin XR 1000mg15mbedtime, lantus 16u and januvia 100mg 75my. Denies low sugars or hypoglycemic symptoms. Denies paresthesias. Last diabetic eye exam unsure. Pneumovax: done. Prevnar: done.  Lab Results  Component Value Date   HGBA1C 6.2 08/29/2013   Diabetic Foot Exam - Simple   Simple Foot Form  Diabetic Foot exam was performed with the following findings:  Yes 03/01/2014 11:20 AM  Visual Inspection  No deformities, no ulcerations, no other skin breakdown bilaterally:  Yes  Sensation Testing  See comments:  Yes  Pulse Check  See comments:  Yes  Comments  Diminished pulses bilaterally Diminished sensation to monofilament testing bilateral soles      Relevant past medical, surgical, family and social history reviewed and updated as indicated. Interim medical history since our last visit reviewed. Allergies and medications reviewed and updated. Current  Outpatient Prescriptions on File Prior to Visit  Medication Sig  . atorvastatin (LIPITOR) 20 MG tablet TAKE 1 TABLET NIGHTLY  . Casanthranol-Docusate Sodium 30-100 MG CAPS as needed.    . cetirizine (ZYRTEC ALLERGY) 10 MG tablet Take 1 tablet (10 mg total) by mouth daily.  . citalopram (CELEXA) 10 MG tablet Take 1 tablet (10 mg total) by mouth daily.  . clopidogrel (PLAVIX) 75 MG tablet TAKE 1 TABLET DAILY  . docusate sodium (COLACE) 100 MG capsule Take 1 capsule (100 mg total) by mouth daily.  . fenofibrate micronized (ANTARA) 130 MG capsule TAKE 1 CAPSULE DAILY  . fluticasone (FLONASE) 50 MCG/ACT nasal spray Place 2 sprays into both nostrils daily.  . glucMarland Kitchense blood (FREESTYLE LITE) test strip Use to check sugar three times daily Dx: 250.40  . glucose monitoring kit (FREESTYLE) monitoring kit 1 each by Does not apply route as needed for other.  . Insulin Glargine (LANTUS) 100 UNIT/ML Solostar Pen Inject 16 Units into the skin daily.  . Insulin Pen Needle 30G X 5 MM MISC Use as directed to inject insulin  . JANUVIA 100 MG tablet TAKE 1 TABLET DAILY  . Lancets (FREESTYLE) lancets Use to check sugar three times daily Dx: 250.40  . LORazepam (ATIVAN) 1 MG tablet Take 1 tablet (1 mg total) by mouth at bedtime. insomnia  . metFORMIN (GLUCOPHAGE-XR) 500 MG 24 hr tablet TAKE 2 TABLETS AT BEDTIME  . metoprolol tartrate (LOPRESSOR) 25 MG tablet TAKE 1 TABLET TWO TIMES DAILY  . Multiple Vitamin (  MULTIVITAMIN) capsule Take 1 capsule by mouth daily.    Marland Kitchen NITROSTAT 0.4 MG SL tablet DISSOLVE 1 TABLET UNDER THE TONGUE AS NEEDED  . prednisoLONE sodium phosphate (INFLAMASE FORTE) 1 % ophthalmic solution Place 1 drop into both eyes daily.   . RABEprazole (ACIPHEX) 20 MG tablet TAKE 1 TABLET DAILY  . RANEXA 1000 MG SR tablet TAKE 1 TABLET TWICE A DAY  . torsemide (DEMADEX) 20 MG tablet Take 2 tablets (40 mg total) by mouth 2 (two) times daily.  . traZODone (DESYREL) 50 MG tablet Take 0.5-1 tablets (25-50 mg  total) by mouth at bedtime as needed for sleep.  . Vitamin D, Ergocalciferol, (DRISDOL) 50000 UNITS CAPS capsule TAKE 1 CAPSULE ONCE EVERY WEEK  . ZETIA 10 MG tablet TAKE 1 TABLET DAILY  . dimenhyDRINATE (DRAMAMINE) 50 MG tablet Take 50 mg by mouth at bedtime as needed.  . potassium chloride SA (K-DUR,KLOR-CON) 20 MEQ tablet Take 20 mEq by mouth daily.  Marland Kitchen senna-docusate (SENOKOT-S) 8.6-50 MG per tablet Take 1 tablet by mouth as needed for mild constipation.   No current facility-administered medications on file prior to visit.    Review of Systems Per HPI unless specifically indicated above     Objective:    BP 116/60 mmHg  Pulse 76  Temp(Src) 97.4 F (36.3 C) (Oral)  Wt 140 lb (63.504 kg)  Wt Readings from Last 3 Encounters:  03/01/14 140 lb (63.504 kg)  02/11/14 139 lb 8 oz (63.277 kg)  12/31/13 140 lb 8 oz (63.73 kg)    Physical Exam  Constitutional: She appears well-developed and well-nourished. No distress.  Elderly uses 3 wheeled rolling walker and O2 by [place]  HENT:  Head: Normocephalic and atraumatic.  Right Ear: External ear normal.  Left Ear: External ear normal.  Nose: Nose normal.  Mouth/Throat: Oropharynx is clear and moist. No oropharyngeal exudate.  Eyes: Conjunctivae and EOM are normal. Pupils are equal, round, and reactive to light. No scleral icterus.  Neck: Normal range of motion. Neck supple.  Cardiovascular: Normal rate, regular rhythm, normal heart sounds and intact distal pulses.   No murmur heard. Pulmonary/Chest: Effort normal and breath sounds normal. No respiratory distress. She has no wheezes. She has no rales.  Bibasilar crackles  Musculoskeletal: She exhibits edema (trace).  See HPI for foot exam if done  Lymphadenopathy:    She has no cervical adenopathy.  Skin: Skin is warm and dry. No rash noted.  Psychiatric: She has a normal mood and affect.  Nursing note and vitals reviewed.  Results for orders placed or performed in visit on 10/25/13    HM DIABETES EYE EXAM  Result Value Ref Range   HM Diabetic Eye Exam  No Retinopathy      Assessment & Plan:   Problem List Items Addressed This Visit    Well controlled type 2 diabetes mellitus with nephropathy - Primary    Endorses slight uptick in glycemic levels. Will check A1c and if >8% then change meds accordingly, likely would increase lantus again.      Relevant Medications   aspirin 81 MG tablet   Other Relevant Orders   Renal function panel   Hemoglobin A1c   Shaking spells    Recent spell last night attributed to previous poor night's sleep.      Insomnia    Would consider increased trazodone to $RemoveBefo'50mg'VpqEimohoQB$  nightly.      Essential hypertension    Chronic, stable. Continue regimen.      Relevant  Medications   aspirin 81 MG tablet   Depression    Continued difficulty adjusting to limitations due to her disease process.      Coronary artery disease    Denies angina.      Relevant Medications   aspirin 81 MG tablet   CKD (chronic kidney disease) stage 3, GFR 30-59 ml/min    Check Cr today. Now on torsemide, once daily 68m for last week.      Relevant Orders   Renal function panel   Chronic hypoxemic respiratory failure    Continue continuous O2 at 2L/min via [place].      Chronic diastolic CHF (congestive heart failure)    Furosemide lost effectiveness.  Now on torsemide 498mbid until weight reaches 135lb goal, then down to 4073maily. She has been on once daily dosing for last week - and has noted weight gain again. Advised to return to bid dosing. Check renal panel and dose potassium supplementation accordingly      Relevant Medications   aspirin 81 MG tablet       Follow up plan: Return in about 3 months (around 05/31/2014), or as needed, for medicare wellness visit.

## 2014-03-01 NOTE — Assessment & Plan Note (Signed)
Continued difficulty adjusting to limitations due to her disease process.

## 2014-03-01 NOTE — Assessment & Plan Note (Signed)
Furosemide lost effectiveness.  Now on torsemide 40mg  bid until weight reaches 135lb goal, then down to 40mg  daily. She has been on once daily dosing for last week - and has noted weight gain again. Advised to return to bid dosing. Check renal panel and dose potassium supplementation accordingly

## 2014-03-01 NOTE — Assessment & Plan Note (Signed)
Endorses slight uptick in glycemic levels. Will check A1c and if >8% then change meds accordingly, likely would increase lantus again.

## 2014-03-01 NOTE — Assessment & Plan Note (Signed)
Chronic, stable. Continue regimen. 

## 2014-03-01 NOTE — Assessment & Plan Note (Signed)
Recent spell last night attributed to previous poor night's sleep.

## 2014-03-01 NOTE — Progress Notes (Signed)
Pre visit review using our clinic review tool, if applicable. No additional management support is needed unless otherwise documented below in the visit note. 

## 2014-03-01 NOTE — Assessment & Plan Note (Signed)
Continue continuous O2 at 2L/min via [place].

## 2014-03-01 NOTE — Assessment & Plan Note (Addendum)
Check Cr today. Now on torsemide, once daily 40mg  for last week.

## 2014-03-03 ENCOUNTER — Other Ambulatory Visit: Payer: Self-pay | Admitting: Family Medicine

## 2014-03-04 DIAGNOSIS — S72001A Fracture of unspecified part of neck of right femur, initial encounter for closed fracture: Secondary | ICD-10-CM

## 2014-03-04 HISTORY — DX: Fracture of unspecified part of neck of right femur, initial encounter for closed fracture: S72.001A

## 2014-03-04 HISTORY — PX: HIP FRACTURE SURGERY: SHX118

## 2014-03-13 ENCOUNTER — Telehealth: Payer: Self-pay | Admitting: Family Medicine

## 2014-03-13 ENCOUNTER — Emergency Department: Payer: Self-pay | Admitting: Student

## 2014-03-13 DIAGNOSIS — M25551 Pain in right hip: Secondary | ICD-10-CM | POA: Diagnosis not present

## 2014-03-13 DIAGNOSIS — W1830XA Fall on same level, unspecified, initial encounter: Secondary | ICD-10-CM | POA: Diagnosis not present

## 2014-03-13 DIAGNOSIS — Z0389 Encounter for observation for other suspected diseases and conditions ruled out: Secondary | ICD-10-CM | POA: Diagnosis not present

## 2014-03-13 DIAGNOSIS — I509 Heart failure, unspecified: Secondary | ICD-10-CM | POA: Diagnosis not present

## 2014-03-13 DIAGNOSIS — S129XXA Fracture of neck, unspecified, initial encounter: Secondary | ICD-10-CM | POA: Diagnosis not present

## 2014-03-13 DIAGNOSIS — S72001A Fracture of unspecified part of neck of right femur, initial encounter for closed fracture: Secondary | ICD-10-CM | POA: Diagnosis not present

## 2014-03-13 DIAGNOSIS — I5030 Unspecified diastolic (congestive) heart failure: Secondary | ICD-10-CM | POA: Diagnosis not present

## 2014-03-13 DIAGNOSIS — J9610 Chronic respiratory failure, unspecified whether with hypoxia or hypercapnia: Secondary | ICD-10-CM | POA: Diagnosis not present

## 2014-03-13 DIAGNOSIS — R102 Pelvic and perineal pain: Secondary | ICD-10-CM | POA: Diagnosis not present

## 2014-03-13 DIAGNOSIS — I2510 Atherosclerotic heart disease of native coronary artery without angina pectoris: Secondary | ICD-10-CM | POA: Diagnosis not present

## 2014-03-13 DIAGNOSIS — W19XXXA Unspecified fall, initial encounter: Secondary | ICD-10-CM | POA: Diagnosis not present

## 2014-03-13 DIAGNOSIS — S72051A Unspecified fracture of head of right femur, initial encounter for closed fracture: Secondary | ICD-10-CM | POA: Diagnosis not present

## 2014-03-13 DIAGNOSIS — R0602 Shortness of breath: Secondary | ICD-10-CM | POA: Diagnosis not present

## 2014-03-13 DIAGNOSIS — S8991XA Unspecified injury of right lower leg, initial encounter: Secondary | ICD-10-CM | POA: Diagnosis not present

## 2014-03-13 DIAGNOSIS — Z794 Long term (current) use of insulin: Secondary | ICD-10-CM | POA: Diagnosis not present

## 2014-03-13 DIAGNOSIS — S0990XA Unspecified injury of head, initial encounter: Secondary | ICD-10-CM | POA: Diagnosis not present

## 2014-03-13 DIAGNOSIS — Z7901 Long term (current) use of anticoagulants: Secondary | ICD-10-CM | POA: Diagnosis not present

## 2014-03-13 DIAGNOSIS — S72011A Unspecified intracapsular fracture of right femur, initial encounter for closed fracture: Secondary | ICD-10-CM | POA: Diagnosis not present

## 2014-03-13 DIAGNOSIS — M25561 Pain in right knee: Secondary | ICD-10-CM | POA: Diagnosis not present

## 2014-03-13 DIAGNOSIS — F329 Major depressive disorder, single episode, unspecified: Secondary | ICD-10-CM | POA: Diagnosis not present

## 2014-03-13 DIAGNOSIS — R9431 Abnormal electrocardiogram [ECG] [EKG]: Secondary | ICD-10-CM | POA: Diagnosis not present

## 2014-03-13 DIAGNOSIS — Z7952 Long term (current) use of systemic steroids: Secondary | ICD-10-CM | POA: Diagnosis not present

## 2014-03-13 DIAGNOSIS — Z7982 Long term (current) use of aspirin: Secondary | ICD-10-CM | POA: Diagnosis not present

## 2014-03-13 DIAGNOSIS — T148 Other injury of unspecified body region: Secondary | ICD-10-CM | POA: Diagnosis not present

## 2014-03-13 DIAGNOSIS — E119 Type 2 diabetes mellitus without complications: Secondary | ICD-10-CM | POA: Diagnosis not present

## 2014-03-13 DIAGNOSIS — I700 Atherosclerosis of aorta: Secondary | ICD-10-CM | POA: Diagnosis not present

## 2014-03-13 DIAGNOSIS — Z79899 Other long term (current) drug therapy: Secondary | ICD-10-CM | POA: Diagnosis not present

## 2014-03-13 NOTE — Telephone Encounter (Signed)
[place] Patient Name: Christine Blanchard DOB: Sep 21, 1928 Initial Comment Caller states she has fallen and injured her R hip and wants to know what she can do. Caller states she wants to know what the symptoms of a broken hip is. Caller states she was headed to the bathroom and lost her balance. Nurse Assessment Nurse: Martyn Ehrich RN, Felicia Date/Time (Eastern Time): 03/13/2014 5:13:48 PM Confirm and document reason for call. If symptomatic, describe symptoms. ---PT fell and hurt her R hip today after she fell 20 min ago. Lost her balance Has the patient traveled out of the country within the last 30 days? ---No Does the patient require triage? ---Yes Related visit to physician within the last 2 weeks? ---No Does the PT have any chronic conditions? (i.e. diabetes, asthma, etc.) ---Yes List chronic conditions. ---DM and heart condition - CHF Guidelines Guideline Title Affirmed Question Affirmed Notes Hip Injury Sounds like a serious injury to the triager Final Disposition User Go to ED Now Martyn Ehrich, RN, Solmon Ice   Comments **Caller was transferred from the Receptionist at [facility] - caller told her she does not want to make an appt to be seen today or go to the ER. Caller is now saying she can make an appt but has no way to get there today.**   after talking with nurse agreed to call her dtr to be taken to ER

## 2014-03-14 DIAGNOSIS — Z9861 Coronary angioplasty status: Secondary | ICD-10-CM | POA: Diagnosis not present

## 2014-03-14 DIAGNOSIS — R262 Difficulty in walking, not elsewhere classified: Secondary | ICD-10-CM | POA: Diagnosis not present

## 2014-03-14 DIAGNOSIS — S72011A Unspecified intracapsular fracture of right femur, initial encounter for closed fracture: Secondary | ICD-10-CM | POA: Diagnosis present

## 2014-03-14 DIAGNOSIS — Z9981 Dependence on supplemental oxygen: Secondary | ICD-10-CM | POA: Diagnosis not present

## 2014-03-14 DIAGNOSIS — S72001A Fracture of unspecified part of neck of right femur, initial encounter for closed fracture: Secondary | ICD-10-CM | POA: Diagnosis not present

## 2014-03-14 DIAGNOSIS — E785 Hyperlipidemia, unspecified: Secondary | ICD-10-CM | POA: Diagnosis present

## 2014-03-14 DIAGNOSIS — S72009A Fracture of unspecified part of neck of unspecified femur, initial encounter for closed fracture: Secondary | ICD-10-CM | POA: Diagnosis not present

## 2014-03-14 DIAGNOSIS — I509 Heart failure, unspecified: Secondary | ICD-10-CM | POA: Diagnosis not present

## 2014-03-14 DIAGNOSIS — W19XXXA Unspecified fall, initial encounter: Secondary | ICD-10-CM | POA: Diagnosis not present

## 2014-03-14 DIAGNOSIS — Z66 Do not resuscitate: Secondary | ICD-10-CM | POA: Diagnosis present

## 2014-03-14 DIAGNOSIS — I5022 Chronic systolic (congestive) heart failure: Secondary | ICD-10-CM | POA: Diagnosis not present

## 2014-03-14 DIAGNOSIS — J9610 Chronic respiratory failure, unspecified whether with hypoxia or hypercapnia: Secondary | ICD-10-CM | POA: Diagnosis not present

## 2014-03-14 DIAGNOSIS — Z951 Presence of aortocoronary bypass graft: Secondary | ICD-10-CM | POA: Diagnosis not present

## 2014-03-14 DIAGNOSIS — K219 Gastro-esophageal reflux disease without esophagitis: Secondary | ICD-10-CM | POA: Diagnosis present

## 2014-03-14 DIAGNOSIS — F329 Major depressive disorder, single episode, unspecified: Secondary | ICD-10-CM | POA: Diagnosis present

## 2014-03-14 DIAGNOSIS — I10 Essential (primary) hypertension: Secondary | ICD-10-CM | POA: Diagnosis not present

## 2014-03-14 DIAGNOSIS — Z8249 Family history of ischemic heart disease and other diseases of the circulatory system: Secondary | ICD-10-CM | POA: Diagnosis not present

## 2014-03-14 DIAGNOSIS — M6250 Muscle wasting and atrophy, not elsewhere classified, unspecified site: Secondary | ICD-10-CM | POA: Diagnosis not present

## 2014-03-14 DIAGNOSIS — Z794 Long term (current) use of insulin: Secondary | ICD-10-CM | POA: Diagnosis not present

## 2014-03-14 DIAGNOSIS — E119 Type 2 diabetes mellitus without complications: Secondary | ICD-10-CM | POA: Diagnosis present

## 2014-03-14 DIAGNOSIS — E118 Type 2 diabetes mellitus with unspecified complications: Secondary | ICD-10-CM | POA: Diagnosis not present

## 2014-03-14 DIAGNOSIS — M6281 Muscle weakness (generalized): Secondary | ICD-10-CM | POA: Diagnosis not present

## 2014-03-14 DIAGNOSIS — Z7982 Long term (current) use of aspirin: Secondary | ICD-10-CM | POA: Diagnosis not present

## 2014-03-14 DIAGNOSIS — N189 Chronic kidney disease, unspecified: Secondary | ICD-10-CM | POA: Diagnosis present

## 2014-03-14 DIAGNOSIS — I5030 Unspecified diastolic (congestive) heart failure: Secondary | ICD-10-CM | POA: Diagnosis present

## 2014-03-14 DIAGNOSIS — R296 Repeated falls: Secondary | ICD-10-CM | POA: Diagnosis not present

## 2014-03-14 DIAGNOSIS — Z8673 Personal history of transient ischemic attack (TIA), and cerebral infarction without residual deficits: Secondary | ICD-10-CM | POA: Diagnosis not present

## 2014-03-14 DIAGNOSIS — I2510 Atherosclerotic heart disease of native coronary artery without angina pectoris: Secondary | ICD-10-CM | POA: Diagnosis not present

## 2014-03-14 DIAGNOSIS — S7291XD Unspecified fracture of right femur, subsequent encounter for closed fracture with routine healing: Secondary | ICD-10-CM | POA: Diagnosis not present

## 2014-03-14 DIAGNOSIS — T148 Other injury of unspecified body region: Secondary | ICD-10-CM | POA: Diagnosis not present

## 2014-03-14 DIAGNOSIS — J984 Other disorders of lung: Secondary | ICD-10-CM | POA: Diagnosis present

## 2014-03-14 DIAGNOSIS — I129 Hypertensive chronic kidney disease with stage 1 through stage 4 chronic kidney disease, or unspecified chronic kidney disease: Secondary | ICD-10-CM | POA: Diagnosis present

## 2014-03-14 NOTE — Telephone Encounter (Signed)
Noted. plz call today for an update - I presume she went to [facility].

## 2014-03-14 NOTE — Telephone Encounter (Signed)
Message left for patient to return my call.  

## 2014-03-15 NOTE — Telephone Encounter (Signed)
Great - thanks

## 2014-03-15 NOTE — Telephone Encounter (Signed)
Received H&P - admitted at [facility] by Dr Sherrie George with closed R hip fracture, evaluated by ortho and recommended surgical fixation. Was cleared by hospitalist to undergo orthopedic surgery. Plz call daughter - if she's already had surgery would see how she's doing.  If she hasn't yet had surgery, I'd want to get her cardiologist's opinion on surgery prior to undergoing as she is high risk patient. Will route to Dr Rockey Situ as Juluis Rainier.

## 2014-03-15 NOTE — Telephone Encounter (Signed)
Spoke with daughter and patient had surgery yesterday. She came through fine with just pins in the femoral head because everything remained in alignment. She will hopefully be discharged in 3-5 days with 2-4 weeks of rehab. Routed to Dr. Rockey Situ as well.

## 2014-03-16 ENCOUNTER — Encounter: Payer: Self-pay | Admitting: Family Medicine

## 2014-03-18 DIAGNOSIS — S7291XD Unspecified fracture of right femur, subsequent encounter for closed fracture with routine healing: Secondary | ICD-10-CM | POA: Diagnosis not present

## 2014-03-18 DIAGNOSIS — Z955 Presence of coronary angioplasty implant and graft: Secondary | ICD-10-CM | POA: Diagnosis not present

## 2014-03-18 DIAGNOSIS — I10 Essential (primary) hypertension: Secondary | ICD-10-CM | POA: Diagnosis not present

## 2014-03-18 DIAGNOSIS — M6250 Muscle wasting and atrophy, not elsewhere classified, unspecified site: Secondary | ICD-10-CM | POA: Diagnosis not present

## 2014-03-18 DIAGNOSIS — J9610 Chronic respiratory failure, unspecified whether with hypoxia or hypercapnia: Secondary | ICD-10-CM | POA: Diagnosis not present

## 2014-03-18 DIAGNOSIS — S72011G Unspecified intracapsular fracture of right femur, subsequent encounter for closed fracture with delayed healing: Secondary | ICD-10-CM | POA: Diagnosis not present

## 2014-03-18 DIAGNOSIS — Z794 Long term (current) use of insulin: Secondary | ICD-10-CM | POA: Diagnosis not present

## 2014-03-18 DIAGNOSIS — E119 Type 2 diabetes mellitus without complications: Secondary | ICD-10-CM | POA: Diagnosis not present

## 2014-03-18 DIAGNOSIS — R262 Difficulty in walking, not elsewhere classified: Secondary | ICD-10-CM | POA: Diagnosis not present

## 2014-03-18 DIAGNOSIS — D62 Acute posthemorrhagic anemia: Secondary | ICD-10-CM | POA: Diagnosis not present

## 2014-03-18 DIAGNOSIS — R269 Unspecified abnormalities of gait and mobility: Secondary | ICD-10-CM | POA: Diagnosis not present

## 2014-03-18 DIAGNOSIS — Z79891 Long term (current) use of opiate analgesic: Secondary | ICD-10-CM | POA: Diagnosis not present

## 2014-03-18 DIAGNOSIS — I5022 Chronic systolic (congestive) heart failure: Secondary | ICD-10-CM | POA: Diagnosis not present

## 2014-03-18 DIAGNOSIS — S72009A Fracture of unspecified part of neck of unspecified femur, initial encounter for closed fracture: Secondary | ICD-10-CM | POA: Diagnosis not present

## 2014-03-18 DIAGNOSIS — I2510 Atherosclerotic heart disease of native coronary artery without angina pectoris: Secondary | ICD-10-CM | POA: Diagnosis not present

## 2014-03-18 DIAGNOSIS — M6281 Muscle weakness (generalized): Secondary | ICD-10-CM | POA: Diagnosis not present

## 2014-03-18 DIAGNOSIS — M25551 Pain in right hip: Secondary | ICD-10-CM | POA: Diagnosis not present

## 2014-03-18 DIAGNOSIS — Z7982 Long term (current) use of aspirin: Secondary | ICD-10-CM | POA: Diagnosis not present

## 2014-03-18 DIAGNOSIS — I509 Heart failure, unspecified: Secondary | ICD-10-CM | POA: Diagnosis not present

## 2014-03-18 DIAGNOSIS — R110 Nausea: Secondary | ICD-10-CM | POA: Diagnosis not present

## 2014-03-18 DIAGNOSIS — Z885 Allergy status to narcotic agent status: Secondary | ICD-10-CM | POA: Diagnosis not present

## 2014-03-18 DIAGNOSIS — S72001A Fracture of unspecified part of neck of right femur, initial encounter for closed fracture: Secondary | ICD-10-CM | POA: Diagnosis not present

## 2014-03-18 DIAGNOSIS — R202 Paresthesia of skin: Secondary | ICD-10-CM | POA: Diagnosis not present

## 2014-03-18 DIAGNOSIS — Z7401 Bed confinement status: Secondary | ICD-10-CM | POA: Diagnosis not present

## 2014-03-18 DIAGNOSIS — Z7901 Long term (current) use of anticoagulants: Secondary | ICD-10-CM | POA: Diagnosis not present

## 2014-03-18 DIAGNOSIS — G8918 Other acute postprocedural pain: Secondary | ICD-10-CM | POA: Diagnosis not present

## 2014-03-18 DIAGNOSIS — R296 Repeated falls: Secondary | ICD-10-CM | POA: Diagnosis not present

## 2014-03-18 DIAGNOSIS — S72001D Fracture of unspecified part of neck of right femur, subsequent encounter for closed fracture with routine healing: Secondary | ICD-10-CM | POA: Diagnosis not present

## 2014-03-21 ENCOUNTER — Encounter: Payer: Self-pay | Admitting: Family Medicine

## 2014-03-21 DIAGNOSIS — G8918 Other acute postprocedural pain: Secondary | ICD-10-CM | POA: Diagnosis not present

## 2014-03-21 DIAGNOSIS — S72001A Fracture of unspecified part of neck of right femur, initial encounter for closed fracture: Secondary | ICD-10-CM | POA: Diagnosis not present

## 2014-03-21 DIAGNOSIS — D62 Acute posthemorrhagic anemia: Secondary | ICD-10-CM | POA: Diagnosis not present

## 2014-03-21 DIAGNOSIS — E119 Type 2 diabetes mellitus without complications: Secondary | ICD-10-CM | POA: Diagnosis not present

## 2014-03-22 DIAGNOSIS — Z79891 Long term (current) use of opiate analgesic: Secondary | ICD-10-CM | POA: Diagnosis not present

## 2014-03-22 DIAGNOSIS — R202 Paresthesia of skin: Secondary | ICD-10-CM | POA: Diagnosis not present

## 2014-03-22 DIAGNOSIS — I509 Heart failure, unspecified: Secondary | ICD-10-CM | POA: Diagnosis not present

## 2014-03-22 DIAGNOSIS — Z955 Presence of coronary angioplasty implant and graft: Secondary | ICD-10-CM | POA: Diagnosis not present

## 2014-03-22 DIAGNOSIS — I2510 Atherosclerotic heart disease of native coronary artery without angina pectoris: Secondary | ICD-10-CM | POA: Diagnosis not present

## 2014-03-22 DIAGNOSIS — E119 Type 2 diabetes mellitus without complications: Secondary | ICD-10-CM | POA: Diagnosis not present

## 2014-03-22 DIAGNOSIS — Z885 Allergy status to narcotic agent status: Secondary | ICD-10-CM | POA: Diagnosis not present

## 2014-03-22 DIAGNOSIS — S72011G Unspecified intracapsular fracture of right femur, subsequent encounter for closed fracture with delayed healing: Secondary | ICD-10-CM | POA: Diagnosis not present

## 2014-03-22 DIAGNOSIS — M25551 Pain in right hip: Secondary | ICD-10-CM | POA: Diagnosis not present

## 2014-03-26 ENCOUNTER — Telehealth: Payer: Self-pay | Admitting: Family Medicine

## 2014-03-26 DIAGNOSIS — S72001D Fracture of unspecified part of neck of right femur, subsequent encounter for closed fracture with routine healing: Secondary | ICD-10-CM | POA: Diagnosis not present

## 2014-03-26 DIAGNOSIS — G8918 Other acute postprocedural pain: Secondary | ICD-10-CM | POA: Diagnosis not present

## 2014-03-26 DIAGNOSIS — Z7689 Persons encountering health services in other specified circumstances: Secondary | ICD-10-CM

## 2014-03-26 DIAGNOSIS — R110 Nausea: Secondary | ICD-10-CM | POA: Diagnosis not present

## 2014-03-26 NOTE — Telephone Encounter (Signed)
Yes plz. Thanks.

## 2014-03-26 NOTE — Telephone Encounter (Signed)
[employer] faxed over FMLA paperwork for pt daughter Nicole Cella. Please review, complete and sign and return to [place].  Forms in Dr. Darnell Level Inbox

## 2014-03-26 NOTE — Telephone Encounter (Signed)
Pt was discharged from [facility] 03/18/14.   Dr. Darnell Level- Would you like $20 simple form charge?

## 2014-03-26 NOTE — Telephone Encounter (Signed)
Filled and in my out box Needs discharge from hospital date filled out (highlighted in packet)

## 2014-04-01 ENCOUNTER — Ambulatory Visit: Payer: Medicare Other | Admitting: Cardiovascular Disease

## 2014-04-11 DIAGNOSIS — R269 Unspecified abnormalities of gait and mobility: Secondary | ICD-10-CM | POA: Diagnosis not present

## 2014-04-11 DIAGNOSIS — I509 Heart failure, unspecified: Secondary | ICD-10-CM | POA: Diagnosis not present

## 2014-04-11 DIAGNOSIS — M25551 Pain in right hip: Secondary | ICD-10-CM | POA: Diagnosis not present

## 2014-04-11 DIAGNOSIS — I2510 Atherosclerotic heart disease of native coronary artery without angina pectoris: Secondary | ICD-10-CM | POA: Diagnosis not present

## 2014-04-12 ENCOUNTER — Telehealth: Payer: Self-pay | Admitting: Family Medicine

## 2014-04-12 ENCOUNTER — Other Ambulatory Visit: Payer: Self-pay

## 2014-04-12 MED ORDER — LORAZEPAM 1 MG PO TABS: 1.0000 mg | ORAL_TABLET | Freq: Every day | ORAL | Status: DC

## 2014-04-12 MED ORDER — TRAMADOL HCL 50 MG PO TABS: 50.0000 mg | ORAL_TABLET | Freq: Three times a day (TID) | ORAL | Status: DC | PRN

## 2014-04-12 NOTE — Telephone Encounter (Signed)
Larrelle with [name] said pt just returned to [facility] and needs Lorazepam rx hard copy sent to [facility]. Also request new rx for Tramadol HCL 50 mg with instructions 1 tab q 8 h while awake; Tramadol was prescribed by Dr Maudie Flakes while at rehab. Pt had fx hip. Pt has f/u appt on 04/25/14 with Dr Danise Mina. Ryan said request needs to be done today so pt will have med this weekend.Please advise.

## 2014-04-12 NOTE — Telephone Encounter (Signed)
printed and in Kim's box. 

## 2014-04-12 NOTE — Telephone Encounter (Signed)
Rx's faxed to [facility].

## 2014-04-15 DIAGNOSIS — J9610 Chronic respiratory failure, unspecified whether with hypoxia or hypercapnia: Secondary | ICD-10-CM | POA: Diagnosis not present

## 2014-04-15 DIAGNOSIS — I2510 Atherosclerotic heart disease of native coronary artery without angina pectoris: Secondary | ICD-10-CM | POA: Diagnosis not present

## 2014-04-15 DIAGNOSIS — R2689 Other abnormalities of gait and mobility: Secondary | ICD-10-CM | POA: Diagnosis not present

## 2014-04-15 DIAGNOSIS — E119 Type 2 diabetes mellitus without complications: Secondary | ICD-10-CM | POA: Diagnosis not present

## 2014-04-15 DIAGNOSIS — I5032 Chronic diastolic (congestive) heart failure: Secondary | ICD-10-CM | POA: Diagnosis not present

## 2014-04-15 DIAGNOSIS — S72001D Fracture of unspecified part of neck of right femur, subsequent encounter for closed fracture with routine healing: Secondary | ICD-10-CM | POA: Diagnosis not present

## 2014-04-16 ENCOUNTER — Telehealth: Payer: Self-pay

## 2014-04-16 NOTE — Telephone Encounter (Signed)
Ok to provide verbal order for all below.

## 2014-04-16 NOTE — Telephone Encounter (Signed)
Message left notifying Christine Blanchard.

## 2014-04-16 NOTE — Telephone Encounter (Signed)
Mickel Baas with [facility] left v/m requesting verbal orders for home health PT 3 x a week for 2 weeks and 2 x a week for 6 weeks due to hip pinning. Also request skilled nurse visit; duoderm on rt buttocks and wants nurse to access to see what type wound or pressure ulcer is involved. Also request home health OT.Please advise.

## 2014-04-17 ENCOUNTER — Telehealth: Payer: Self-pay | Admitting: *Deleted

## 2014-04-17 DIAGNOSIS — E119 Type 2 diabetes mellitus without complications: Secondary | ICD-10-CM | POA: Diagnosis not present

## 2014-04-17 DIAGNOSIS — R2689 Other abnormalities of gait and mobility: Secondary | ICD-10-CM | POA: Diagnosis not present

## 2014-04-17 DIAGNOSIS — I2510 Atherosclerotic heart disease of native coronary artery without angina pectoris: Secondary | ICD-10-CM | POA: Diagnosis not present

## 2014-04-17 DIAGNOSIS — I5032 Chronic diastolic (congestive) heart failure: Secondary | ICD-10-CM | POA: Diagnosis not present

## 2014-04-17 DIAGNOSIS — S72001D Fracture of unspecified part of neck of right femur, subsequent encounter for closed fracture with routine healing: Secondary | ICD-10-CM | POA: Diagnosis not present

## 2014-04-17 DIAGNOSIS — J9610 Chronic respiratory failure, unspecified whether with hypoxia or hypercapnia: Secondary | ICD-10-CM | POA: Diagnosis not present

## 2014-04-17 NOTE — Telephone Encounter (Signed)
error 

## 2014-04-17 NOTE — Telephone Encounter (Deleted)
Form from [facility] in your IN box for completion.

## 2014-04-17 NOTE — Telephone Encounter (Signed)
Form from [facility] in your IN box for completion.

## 2014-04-18 DIAGNOSIS — R2689 Other abnormalities of gait and mobility: Secondary | ICD-10-CM | POA: Diagnosis not present

## 2014-04-18 DIAGNOSIS — E119 Type 2 diabetes mellitus without complications: Secondary | ICD-10-CM | POA: Diagnosis not present

## 2014-04-18 DIAGNOSIS — I5032 Chronic diastolic (congestive) heart failure: Secondary | ICD-10-CM | POA: Diagnosis not present

## 2014-04-18 DIAGNOSIS — I2510 Atherosclerotic heart disease of native coronary artery without angina pectoris: Secondary | ICD-10-CM | POA: Diagnosis not present

## 2014-04-18 DIAGNOSIS — J9610 Chronic respiratory failure, unspecified whether with hypoxia or hypercapnia: Secondary | ICD-10-CM | POA: Diagnosis not present

## 2014-04-18 DIAGNOSIS — S72001D Fracture of unspecified part of neck of right femur, subsequent encounter for closed fracture with routine healing: Secondary | ICD-10-CM | POA: Diagnosis not present

## 2014-04-19 ENCOUNTER — Telehealth: Payer: Self-pay | Admitting: Family Medicine

## 2014-04-19 DIAGNOSIS — S72001D Fracture of unspecified part of neck of right femur, subsequent encounter for closed fracture with routine healing: Secondary | ICD-10-CM | POA: Diagnosis not present

## 2014-04-19 DIAGNOSIS — I2510 Atherosclerotic heart disease of native coronary artery without angina pectoris: Secondary | ICD-10-CM | POA: Diagnosis not present

## 2014-04-19 DIAGNOSIS — R2689 Other abnormalities of gait and mobility: Secondary | ICD-10-CM | POA: Diagnosis not present

## 2014-04-19 DIAGNOSIS — E119 Type 2 diabetes mellitus without complications: Secondary | ICD-10-CM | POA: Diagnosis not present

## 2014-04-19 DIAGNOSIS — J9610 Chronic respiratory failure, unspecified whether with hypoxia or hypercapnia: Secondary | ICD-10-CM | POA: Diagnosis not present

## 2014-04-19 DIAGNOSIS — I5032 Chronic diastolic (congestive) heart failure: Secondary | ICD-10-CM | POA: Diagnosis not present

## 2014-04-19 NOTE — Telephone Encounter (Signed)
Filled and placed in my out box. Consider d/c trazodone.

## 2014-04-19 NOTE — Telephone Encounter (Signed)
Ester called she evaluated ms [place] Ester is going to see her 2 week 2 1 week 1  For OT

## 2014-04-19 NOTE — Telephone Encounter (Signed)
Opened in error

## 2014-04-20 ENCOUNTER — Other Ambulatory Visit: Payer: Self-pay | Admitting: Family Medicine

## 2014-04-22 DIAGNOSIS — E119 Type 2 diabetes mellitus without complications: Secondary | ICD-10-CM | POA: Diagnosis not present

## 2014-04-22 DIAGNOSIS — I2510 Atherosclerotic heart disease of native coronary artery without angina pectoris: Secondary | ICD-10-CM | POA: Diagnosis not present

## 2014-04-22 DIAGNOSIS — S72001D Fracture of unspecified part of neck of right femur, subsequent encounter for closed fracture with routine healing: Secondary | ICD-10-CM | POA: Diagnosis not present

## 2014-04-22 DIAGNOSIS — J9610 Chronic respiratory failure, unspecified whether with hypoxia or hypercapnia: Secondary | ICD-10-CM | POA: Diagnosis not present

## 2014-04-22 DIAGNOSIS — I5032 Chronic diastolic (congestive) heart failure: Secondary | ICD-10-CM | POA: Diagnosis not present

## 2014-04-22 DIAGNOSIS — R2689 Other abnormalities of gait and mobility: Secondary | ICD-10-CM | POA: Diagnosis not present

## 2014-04-23 ENCOUNTER — Telehealth: Payer: Self-pay | Admitting: *Deleted

## 2014-04-23 DIAGNOSIS — I2510 Atherosclerotic heart disease of native coronary artery without angina pectoris: Secondary | ICD-10-CM | POA: Diagnosis not present

## 2014-04-23 DIAGNOSIS — J9610 Chronic respiratory failure, unspecified whether with hypoxia or hypercapnia: Secondary | ICD-10-CM | POA: Diagnosis not present

## 2014-04-23 DIAGNOSIS — R2689 Other abnormalities of gait and mobility: Secondary | ICD-10-CM | POA: Diagnosis not present

## 2014-04-23 DIAGNOSIS — I5032 Chronic diastolic (congestive) heart failure: Secondary | ICD-10-CM | POA: Diagnosis not present

## 2014-04-23 DIAGNOSIS — S72001D Fracture of unspecified part of neck of right femur, subsequent encounter for closed fracture with routine healing: Secondary | ICD-10-CM | POA: Diagnosis not present

## 2014-04-23 DIAGNOSIS — E119 Type 2 diabetes mellitus without complications: Secondary | ICD-10-CM | POA: Diagnosis not present

## 2014-04-23 NOTE — Telephone Encounter (Signed)
[name] OT with [facility] called and wanted to let you know that patient's BP was 95/60 and she was nauseated. I spoke with patient and she said the nausea isn't anything new and Dr. Rockey Situ believed it was stemming from her cardiac issues. She denied dizziness, CP, HA or any other complaint. She did endorse not eating as well and losing weight (down to 124 lbs) along with just not bouncing back from her surgery as fast as she thought she should, but good fluid intake. She has a follow up scheduled for 04/25/14. She stopped her pain meds yesterday with no change in the nausea. Jamin Humphries can be reached at [phone] if any changes need to be made. Sherlynn Stalls can be reached at [phone].

## 2014-04-24 ENCOUNTER — Telehealth: Payer: Self-pay | Admitting: *Deleted

## 2014-04-24 DIAGNOSIS — J9610 Chronic respiratory failure, unspecified whether with hypoxia or hypercapnia: Secondary | ICD-10-CM | POA: Diagnosis not present

## 2014-04-24 DIAGNOSIS — I5032 Chronic diastolic (congestive) heart failure: Secondary | ICD-10-CM | POA: Diagnosis not present

## 2014-04-24 DIAGNOSIS — R2689 Other abnormalities of gait and mobility: Secondary | ICD-10-CM | POA: Diagnosis not present

## 2014-04-24 DIAGNOSIS — S72001D Fracture of unspecified part of neck of right femur, subsequent encounter for closed fracture with routine healing: Secondary | ICD-10-CM | POA: Diagnosis not present

## 2014-04-24 DIAGNOSIS — I2510 Atherosclerotic heart disease of native coronary artery without angina pectoris: Secondary | ICD-10-CM | POA: Diagnosis not present

## 2014-04-24 DIAGNOSIS — E119 Type 2 diabetes mellitus without complications: Secondary | ICD-10-CM | POA: Diagnosis not present

## 2014-04-24 NOTE — Telephone Encounter (Signed)
error 

## 2014-04-24 NOTE — Telephone Encounter (Signed)
Will see then. If bp remains low may need to lower torsemide or metoprolol.

## 2014-04-25 ENCOUNTER — Encounter: Payer: Self-pay | Admitting: Family Medicine

## 2014-04-25 ENCOUNTER — Ambulatory Visit (INDEPENDENT_AMBULATORY_CARE_PROVIDER_SITE_OTHER): Payer: Medicare Other | Admitting: Family Medicine

## 2014-04-25 VITALS — BP 112/62 | HR 75 | Temp 97.7°F | Resp 16 | Wt 117.4 lb

## 2014-04-25 DIAGNOSIS — E785 Hyperlipidemia, unspecified: Secondary | ICD-10-CM | POA: Diagnosis not present

## 2014-04-25 DIAGNOSIS — S72001D Fracture of unspecified part of neck of right femur, subsequent encounter for closed fracture with routine healing: Secondary | ICD-10-CM | POA: Diagnosis not present

## 2014-04-25 DIAGNOSIS — J9610 Chronic respiratory failure, unspecified whether with hypoxia or hypercapnia: Secondary | ICD-10-CM | POA: Diagnosis not present

## 2014-04-25 DIAGNOSIS — E1121 Type 2 diabetes mellitus with diabetic nephropathy: Secondary | ICD-10-CM

## 2014-04-25 DIAGNOSIS — I10 Essential (primary) hypertension: Secondary | ICD-10-CM | POA: Diagnosis not present

## 2014-04-25 DIAGNOSIS — R2689 Other abnormalities of gait and mobility: Secondary | ICD-10-CM | POA: Diagnosis not present

## 2014-04-25 DIAGNOSIS — I25810 Atherosclerosis of coronary artery bypass graft(s) without angina pectoris: Secondary | ICD-10-CM

## 2014-04-25 DIAGNOSIS — R110 Nausea: Secondary | ICD-10-CM | POA: Diagnosis not present

## 2014-04-25 DIAGNOSIS — I5032 Chronic diastolic (congestive) heart failure: Secondary | ICD-10-CM

## 2014-04-25 DIAGNOSIS — I2510 Atherosclerotic heart disease of native coronary artery without angina pectoris: Secondary | ICD-10-CM | POA: Diagnosis not present

## 2014-04-25 DIAGNOSIS — E119 Type 2 diabetes mellitus without complications: Secondary | ICD-10-CM | POA: Diagnosis not present

## 2014-04-25 LAB — CBC WITH DIFFERENTIAL/PLATELET
BASOS ABS: 0 10*3/uL (ref 0.0–0.1)
Basophils Relative: 0.4 % (ref 0.0–3.0)
EOS ABS: 0.2 10*3/uL (ref 0.0–0.7)
Eosinophils Relative: 2.3 % (ref 0.0–5.0)
HCT: 36.6 % (ref 36.0–46.0)
Hemoglobin: 12 g/dL (ref 12.0–15.0)
Lymphocytes Relative: 31 % (ref 12.0–46.0)
Lymphs Abs: 2.7 10*3/uL (ref 0.7–4.0)
MCHC: 32.7 g/dL (ref 30.0–36.0)
MCV: 91.3 fl (ref 78.0–100.0)
Monocytes Absolute: 1.1 10*3/uL — ABNORMAL HIGH (ref 0.1–1.0)
Monocytes Relative: 12.3 % — ABNORMAL HIGH (ref 3.0–12.0)
NEUTROS PCT: 54 % (ref 43.0–77.0)
Neutro Abs: 4.6 10*3/uL (ref 1.4–7.7)
Platelets: 339 10*3/uL (ref 150.0–400.0)
RBC: 4.01 Mil/uL (ref 3.87–5.11)
RDW: 16.5 % — AB (ref 11.5–15.5)
WBC: 8.6 10*3/uL (ref 4.0–10.5)

## 2014-04-25 LAB — COMPREHENSIVE METABOLIC PANEL
ALK PHOS: 70 U/L (ref 39–117)
ALT: 17 U/L (ref 0–35)
AST: 29 U/L (ref 0–37)
Albumin: 4 g/dL (ref 3.5–5.2)
BILIRUBIN TOTAL: 0.5 mg/dL (ref 0.2–1.2)
BUN: 14 mg/dL (ref 6–23)
CO2: 33 mEq/L — ABNORMAL HIGH (ref 19–32)
Calcium: 10.1 mg/dL (ref 8.4–10.5)
Chloride: 98 mEq/L (ref 96–112)
Creatinine, Ser: 0.85 mg/dL (ref 0.40–1.20)
GFR: 67.73 mL/min (ref >60.00)
GLUCOSE: 104 mg/dL — AB (ref 70–99)
Potassium: 3.6 mEq/L (ref 3.5–5.1)
SODIUM: 138 meq/L (ref 135–145)
TOTAL PROTEIN: 7.6 g/dL (ref 6.0–8.3)

## 2014-04-25 LAB — LDL CHOLESTEROL, DIRECT: Direct LDL: 78 mg/dL

## 2014-04-25 LAB — TSH: TSH: 1.5 u[IU]/mL (ref 0.35–4.50)

## 2014-04-25 MED ORDER — SITAGLIPTIN PHOSPHATE 50 MG PO TABS: 50.0000 mg | ORAL_TABLET | Freq: Every day | ORAL | Status: DC

## 2014-04-25 MED ORDER — NITROGLYCERIN 0.4 MG SL SUBL: SUBLINGUAL_TABLET | SUBLINGUAL | Status: DC

## 2014-04-25 MED ORDER — ONDANSETRON HCL 4 MG PO TABS: 4.0000 mg | ORAL_TABLET | Freq: Three times a day (TID) | ORAL | Status: DC | PRN

## 2014-04-25 NOTE — Assessment & Plan Note (Addendum)
Actually seems relatively dry but faint crackles heard in lungs so no changes made to torsemide. Encouraged liberalizing fluid intake.

## 2014-04-25 NOTE — Assessment & Plan Note (Signed)
Persistent ongoing nausea with weight loss. Per pt and daughter, ongoing since 12/2013, but weight loss marked since hip surgery - anticipate partly related to FTT after hip surgery.  Also possibly med related - will slowly peel back some of her meds. Today decrease metformin and januvia doses. Check CMP, CBC, and TSH today. Will also provide pt zofran prn nausea.

## 2014-04-25 NOTE — Patient Instructions (Addendum)
We will order nitroglycerin SL to use as needed.  Let's decrease januvia to 50mg  daily - I have sent this to your pharmacy. Let's decrease metformin to 500mg  XR once daily. Continue torsemide 40mg  daily as well as potassium 41mEq  Blood work today and pass by [facility] or [place] to schedule abdominal ultrasound.  zofran 4mg  for nausea as needed.

## 2014-04-25 NOTE — Progress Notes (Signed)
BP 112/62 mmHg  Pulse 75  Temp(Src) 97.7 F (36.5 C) (Oral)  Resp 16  Wt 117 lb 6.4 oz (53.252 kg)  SpO2 97%   CC: rehab f/u visit  Subjective:    Patient ID: Christine Blanchard, female    DOB: 12-15-1928, 81 y.o.   MRN: [id number]  HPI: Christine Blanchard is a 81 y.o. female presenting on 04/25/2014 for Hospitalization Follow-up   Pt hospitalized 2/10-15/2016 after closed R hip fracture s/p CRPP of femoral neck fracture on 2/11.  Torsemide was changed to furosemide.   Weight loss noted. Stays nauseated - worsening. This started prior to Thanksgiving. Was prescribed zofran in rehab which helped. Mild RLQ abd discomfort with stooling. No other abdominal pain. No vomiting, no diarrhea. No dysphagia. Endorses early satiety.   Was in [place] for 1 month. Returned to [facility] since last week.   No recent chest pain. Stable dyspnea. No significant leg swelling.  Has cards appt April 5th  Lab Results  Component Value Date   HGBA1C 6.5 03/01/2014    Relevant past medical, surgical, family and social history reviewed and updated as indicated. Interim medical history since our last visit reviewed. Allergies and medications reviewed and updated. Current Outpatient Prescriptions on File Prior to Visit  Medication Sig  . aspirin 81 MG tablet Take 81 mg by mouth daily.   Marland Kitchen atorvastatin (LIPITOR) 20 MG tablet TAKE 1 TABLET NIGHTLY  . cetirizine (ZYRTEC ALLERGY) 10 MG tablet Take 1 tablet (10 mg total) by mouth daily.  . citalopram (CELEXA) 10 MG tablet Take 1 tablet (10 mg total) by mouth daily.  . clopidogrel (PLAVIX) 75 MG tablet TAKE 1 TABLET DAILY  . docusate sodium (COLACE) 100 MG capsule Take 1 capsule (100 mg total) by mouth daily.  . fenofibrate micronized (ANTARA) 130 MG capsule TAKE 1 CAPSULE DAILY  . fluticasone (FLONASE) 50 MCG/ACT nasal spray Place 2 sprays into both nostrils daily.  Marland Kitchen glucose blood (FREESTYLE LITE) test strip Use to check sugar three times daily Dx:  250.40  . glucose monitoring kit (FREESTYLE) monitoring kit 1 each by Does not apply route as needed for other.  . Insulin Pen Needle 30G X 5 MM MISC Use as directed to inject insulin  . Lancets (FREESTYLE) lancets Use to check sugar three times daily Dx: 250.40  . LORazepam (ATIVAN) 1 MG tablet Take 1 tablet (1 mg total) by mouth at bedtime. insomnia  . metoprolol tartrate (LOPRESSOR) 25 MG tablet TAKE 1 TABLET TWO TIMES DAILY  . Multiple Vitamin (MULTIVITAMIN) capsule Take 1 capsule by mouth daily.    . prednisoLONE sodium phosphate (INFLAMASE FORTE) 1 % ophthalmic solution Place 1 drop into both eyes daily.   . RABEprazole (ACIPHEX) 20 MG tablet TAKE 1 TABLET DAILY  . RANEXA 1000 MG SR tablet TAKE 1 TABLET TWICE A DAY  . sodium fluoride (FLUORISHIELD) 1.1 % GEL dental gel Place 1 application onto teeth 3 (three) times daily after meals.  Marland Kitchen ZETIA 10 MG tablet TAKE 1 TABLET DAILY  . Casanthranol-Docusate Sodium 30-100 MG CAPS as needed.    . dimenhyDRINATE (DRAMAMINE) 50 MG tablet Take 50 mg by mouth at bedtime as needed.  . senna-docusate (SENOKOT-S) 8.6-50 MG per tablet Take 1 tablet by mouth as needed for mild constipation.  . Vitamin D, Ergocalciferol, (DRISDOL) 50000 UNITS CAPS capsule TAKE 1 CAPSULE ONCE EVERY WEEK (Patient not taking: Reported on 04/25/2014)   No current facility-administered medications on file prior to visit.  Review of Systems Per HPI unless specifically indicated above     Objective:    BP 112/62 mmHg  Pulse 75  Temp(Src) 97.7 F (36.5 C) (Oral)  Resp 16  Wt 117 lb 6.4 oz (53.252 kg)  SpO2 97%  Wt Readings from Last 3 Encounters:  04/25/14 117 lb 6.4 oz (53.252 kg)  03/01/14 140 lb (63.504 kg)  02/11/14 139 lb 8 oz (63.277 kg)    Physical Exam  Constitutional:  Frail elderly in wheelchair with supplemental O2 by [place]  HENT:  Dry mm  Neck: Normal range of motion. Neck supple.  Cardiovascular: Normal rate, regular rhythm, normal heart sounds and  intact distal pulses.   No murmur heard. Pulmonary/Chest: Effort normal and breath sounds normal. No respiratory distress. She has no wheezes. She has no rales.  Faint LLL crackles  Abdominal: Soft. Normal appearance and bowel sounds are normal. She exhibits no distension and no mass. There is no hepatosplenomegaly. There is no tenderness. There is no rigidity, no rebound, no guarding, no CVA tenderness and negative Murphy's sign.  Musculoskeletal: She exhibits no edema.  Compression stockings in place  Skin: Skin is warm and dry. No rash noted.  Psychiatric: She has a normal mood and affect.  Nursing note and vitals reviewed.  Results for orders placed or performed in visit on 03/01/14  Renal function panel  Result Value Ref Range   Sodium 139 135 - 145 mEq/L   Potassium 3.9 3.5 - 5.1 mEq/L   Chloride 99 96 - 112 mEq/L   CO2 32 19 - 32 mEq/L   Calcium 9.7 8.4 - 10.5 mg/dL   Albumin 4.1 3.5 - 5.2 g/dL   BUN 20 6 - 23 mg/dL   Creatinine, Ser 1.13 0.40 - 1.20 mg/dL   Glucose, Bld 100 (H) 70 - 99 mg/dL   Phosphorus 3.5 2.3 - 4.6 mg/dL   GFR 48.78 (L) >60.00 mL/min  Hemoglobin A1c  Result Value Ref Range   Hgb A1c MFr Bld 6.5 4.6 - 6.5 %      Assessment & Plan:   Problem List Items Addressed This Visit    Well controlled type 2 diabetes mellitus with nephropathy    Goal for her is <8%. She has consistently had very tight glycemic control - would tend to liberalize control in hopes of improved malaise/nausea - therefore will slowly decrease metformin and januvia dosing. Pt and daughter agree with plan.      Relevant Medications   Insulin Glargine (LANTUS) 100 UNIT/ML Solostar Pen   sitaGLIPtin (JANUVIA) tablet   metFORMIN (GLUCOPHAGE-XR) 24 hr tablet   Nausea - Primary    Persistent ongoing nausea with weight loss. Per pt and daughter, ongoing since 12/2013, but weight loss marked since hip surgery - anticipate partly related to FTT after hip surgery.  Also possibly med related  - will slowly peel back some of her meds. Today decrease metformin and januvia doses. Check CMP, CBC, and TSH today. Will also provide pt zofran prn nausea.      Relevant Orders   Comprehensive metabolic panel   CBC with Differential/Platelet   US Abdomen Complete   Hyperlipidemia    On fibrate, lipitor and zetia. Consider titration off some antihyperlipidemics if nausea persists.  Check CMP and dLDL today.      Relevant Medications   torsemide (DEMADEX) tablet   nitroGLYCERIN (NITROSTAT) SL tablet   Other Relevant Orders   LDL Cholesterol, Direct   Essential hypertension   Relevant Medications  torsemide (DEMADEX) tablet   nitroGLYCERIN (NITROSTAT) SL tablet   Other Relevant Orders   TSH   Chronic diastolic CHF (congestive heart failure)    Actually seems relatively dry but faint crackles heard in lungs so no changes made to torsemide. Encouraged liberalizing fluid intake.      Relevant Medications   torsemide (DEMADEX) tablet   nitroGLYCERIN (NITROSTAT) SL tablet       Follow up plan: Return as needed.

## 2014-04-25 NOTE — Assessment & Plan Note (Addendum)
On fibrate, lipitor and zetia. Consider titration off some antihyperlipidemics if nausea persists.  Check CMP and dLDL today.

## 2014-04-25 NOTE — Assessment & Plan Note (Signed)
Goal for her is <8%. She has consistently had very tight glycemic control - would tend to liberalize control in hopes of improved malaise/nausea - therefore will slowly decrease metformin and januvia dosing. Pt and daughter agree with plan.

## 2014-04-26 DIAGNOSIS — I2510 Atherosclerotic heart disease of native coronary artery without angina pectoris: Secondary | ICD-10-CM | POA: Diagnosis not present

## 2014-04-26 DIAGNOSIS — S72001D Fracture of unspecified part of neck of right femur, subsequent encounter for closed fracture with routine healing: Secondary | ICD-10-CM | POA: Diagnosis not present

## 2014-04-26 DIAGNOSIS — R2689 Other abnormalities of gait and mobility: Secondary | ICD-10-CM | POA: Diagnosis not present

## 2014-04-26 DIAGNOSIS — I5032 Chronic diastolic (congestive) heart failure: Secondary | ICD-10-CM | POA: Diagnosis not present

## 2014-04-26 DIAGNOSIS — J9610 Chronic respiratory failure, unspecified whether with hypoxia or hypercapnia: Secondary | ICD-10-CM | POA: Diagnosis not present

## 2014-04-26 DIAGNOSIS — E119 Type 2 diabetes mellitus without complications: Secondary | ICD-10-CM | POA: Diagnosis not present

## 2014-04-29 ENCOUNTER — Ambulatory Visit: Payer: Self-pay | Admitting: Family Medicine

## 2014-04-29 ENCOUNTER — Encounter: Payer: Self-pay | Admitting: Family Medicine

## 2014-04-29 DIAGNOSIS — J9610 Chronic respiratory failure, unspecified whether with hypoxia or hypercapnia: Secondary | ICD-10-CM | POA: Diagnosis not present

## 2014-04-29 DIAGNOSIS — I2510 Atherosclerotic heart disease of native coronary artery without angina pectoris: Secondary | ICD-10-CM | POA: Diagnosis not present

## 2014-04-29 DIAGNOSIS — R2689 Other abnormalities of gait and mobility: Secondary | ICD-10-CM | POA: Diagnosis not present

## 2014-04-29 DIAGNOSIS — Z9049 Acquired absence of other specified parts of digestive tract: Secondary | ICD-10-CM | POA: Diagnosis not present

## 2014-04-29 DIAGNOSIS — S72001D Fracture of unspecified part of neck of right femur, subsequent encounter for closed fracture with routine healing: Secondary | ICD-10-CM | POA: Diagnosis not present

## 2014-04-29 DIAGNOSIS — I5032 Chronic diastolic (congestive) heart failure: Secondary | ICD-10-CM | POA: Diagnosis not present

## 2014-04-29 DIAGNOSIS — R634 Abnormal weight loss: Secondary | ICD-10-CM | POA: Diagnosis not present

## 2014-04-29 DIAGNOSIS — R110 Nausea: Secondary | ICD-10-CM | POA: Diagnosis not present

## 2014-04-29 DIAGNOSIS — R1011 Right upper quadrant pain: Secondary | ICD-10-CM | POA: Diagnosis not present

## 2014-04-29 DIAGNOSIS — E119 Type 2 diabetes mellitus without complications: Secondary | ICD-10-CM | POA: Diagnosis not present

## 2014-04-30 ENCOUNTER — Other Ambulatory Visit: Payer: Self-pay

## 2014-04-30 ENCOUNTER — Encounter: Payer: Self-pay | Admitting: Family Medicine

## 2014-04-30 DIAGNOSIS — S72001D Fracture of unspecified part of neck of right femur, subsequent encounter for closed fracture with routine healing: Secondary | ICD-10-CM | POA: Diagnosis not present

## 2014-04-30 DIAGNOSIS — I5032 Chronic diastolic (congestive) heart failure: Secondary | ICD-10-CM | POA: Diagnosis not present

## 2014-04-30 DIAGNOSIS — J9610 Chronic respiratory failure, unspecified whether with hypoxia or hypercapnia: Secondary | ICD-10-CM | POA: Diagnosis not present

## 2014-04-30 DIAGNOSIS — E119 Type 2 diabetes mellitus without complications: Secondary | ICD-10-CM | POA: Diagnosis not present

## 2014-04-30 DIAGNOSIS — R2689 Other abnormalities of gait and mobility: Secondary | ICD-10-CM | POA: Diagnosis not present

## 2014-04-30 DIAGNOSIS — I2510 Atherosclerotic heart disease of native coronary artery without angina pectoris: Secondary | ICD-10-CM | POA: Diagnosis not present

## 2014-04-30 MED ORDER — CETIRIZINE HCL 10 MG PO TABS: 10.0000 mg | ORAL_TABLET | Freq: Every day | ORAL | Status: DC

## 2014-05-01 DIAGNOSIS — E119 Type 2 diabetes mellitus without complications: Secondary | ICD-10-CM | POA: Diagnosis not present

## 2014-05-01 DIAGNOSIS — S72001D Fracture of unspecified part of neck of right femur, subsequent encounter for closed fracture with routine healing: Secondary | ICD-10-CM | POA: Diagnosis not present

## 2014-05-01 DIAGNOSIS — J9610 Chronic respiratory failure, unspecified whether with hypoxia or hypercapnia: Secondary | ICD-10-CM | POA: Diagnosis not present

## 2014-05-01 DIAGNOSIS — R2689 Other abnormalities of gait and mobility: Secondary | ICD-10-CM | POA: Diagnosis not present

## 2014-05-01 DIAGNOSIS — I2510 Atherosclerotic heart disease of native coronary artery without angina pectoris: Secondary | ICD-10-CM | POA: Diagnosis not present

## 2014-05-01 DIAGNOSIS — I5032 Chronic diastolic (congestive) heart failure: Secondary | ICD-10-CM | POA: Diagnosis not present

## 2014-05-02 DIAGNOSIS — E119 Type 2 diabetes mellitus without complications: Secondary | ICD-10-CM | POA: Diagnosis not present

## 2014-05-02 DIAGNOSIS — I5032 Chronic diastolic (congestive) heart failure: Secondary | ICD-10-CM | POA: Diagnosis not present

## 2014-05-02 DIAGNOSIS — I2510 Atherosclerotic heart disease of native coronary artery without angina pectoris: Secondary | ICD-10-CM | POA: Diagnosis not present

## 2014-05-02 DIAGNOSIS — R2689 Other abnormalities of gait and mobility: Secondary | ICD-10-CM | POA: Diagnosis not present

## 2014-05-02 DIAGNOSIS — S72001D Fracture of unspecified part of neck of right femur, subsequent encounter for closed fracture with routine healing: Secondary | ICD-10-CM | POA: Diagnosis not present

## 2014-05-02 DIAGNOSIS — J9610 Chronic respiratory failure, unspecified whether with hypoxia or hypercapnia: Secondary | ICD-10-CM | POA: Diagnosis not present

## 2014-05-03 ENCOUNTER — Ambulatory Visit: Payer: Medicare Other | Admitting: Cardiovascular Disease

## 2014-05-03 ENCOUNTER — Encounter: Payer: Self-pay | Admitting: Family Medicine

## 2014-05-03 ENCOUNTER — Other Ambulatory Visit: Payer: Self-pay | Admitting: *Deleted

## 2014-05-03 ENCOUNTER — Telehealth: Payer: Self-pay | Admitting: Family Medicine

## 2014-05-03 DIAGNOSIS — E119 Type 2 diabetes mellitus without complications: Secondary | ICD-10-CM | POA: Diagnosis not present

## 2014-05-03 DIAGNOSIS — I5032 Chronic diastolic (congestive) heart failure: Secondary | ICD-10-CM | POA: Diagnosis not present

## 2014-05-03 DIAGNOSIS — S72001D Fracture of unspecified part of neck of right femur, subsequent encounter for closed fracture with routine healing: Secondary | ICD-10-CM | POA: Diagnosis not present

## 2014-05-03 DIAGNOSIS — J9610 Chronic respiratory failure, unspecified whether with hypoxia or hypercapnia: Secondary | ICD-10-CM | POA: Diagnosis not present

## 2014-05-03 DIAGNOSIS — I2510 Atherosclerotic heart disease of native coronary artery without angina pectoris: Secondary | ICD-10-CM | POA: Diagnosis not present

## 2014-05-03 DIAGNOSIS — R2689 Other abnormalities of gait and mobility: Secondary | ICD-10-CM | POA: Diagnosis not present

## 2014-05-03 MED ORDER — TRAMADOL HCL 50 MG PO TABS: 50.0000 mg | ORAL_TABLET | Freq: Every day | ORAL | Status: DC | PRN

## 2014-05-03 NOTE — Telephone Encounter (Signed)
printed and given to [place].

## 2014-05-03 NOTE — Telephone Encounter (Signed)
Pt is out of her Tramadol 50 mg pills as of tomorrow.  [facility] needs a hard copy to fill this for the patient.  Please call [phone] to address this with [facility].

## 2014-05-03 NOTE — Telephone Encounter (Signed)
Rx faxed to [facility].  Wren Pryce advised but says they need a hard copy but the person I spoke with says she doesn't know how they usually get the hard copy.  Rx faxed and kept at my desk for further instructions on Monday.

## 2014-05-05 NOTE — Telephone Encounter (Signed)
plz call Christine Blanchard to give order to change cbg's to fasting in am daily (stop 8pm check).

## 2014-05-06 DIAGNOSIS — J9610 Chronic respiratory failure, unspecified whether with hypoxia or hypercapnia: Secondary | ICD-10-CM | POA: Diagnosis not present

## 2014-05-06 DIAGNOSIS — I5032 Chronic diastolic (congestive) heart failure: Secondary | ICD-10-CM | POA: Diagnosis not present

## 2014-05-06 DIAGNOSIS — R2689 Other abnormalities of gait and mobility: Secondary | ICD-10-CM | POA: Diagnosis not present

## 2014-05-06 DIAGNOSIS — S72001D Fracture of unspecified part of neck of right femur, subsequent encounter for closed fracture with routine healing: Secondary | ICD-10-CM | POA: Diagnosis not present

## 2014-05-06 DIAGNOSIS — E119 Type 2 diabetes mellitus without complications: Secondary | ICD-10-CM | POA: Diagnosis not present

## 2014-05-06 DIAGNOSIS — I2510 Atherosclerotic heart disease of native coronary artery without angina pectoris: Secondary | ICD-10-CM | POA: Diagnosis not present

## 2014-05-07 ENCOUNTER — Encounter: Payer: Self-pay | Admitting: Cardiovascular Disease

## 2014-05-07 ENCOUNTER — Ambulatory Visit (INDEPENDENT_AMBULATORY_CARE_PROVIDER_SITE_OTHER): Payer: Medicare Other | Admitting: Cardiovascular Disease

## 2014-05-07 VITALS — BP 120/70 | HR 75 | Ht 64.0 in | Wt 126.5 lb

## 2014-05-07 DIAGNOSIS — R110 Nausea: Secondary | ICD-10-CM

## 2014-05-07 DIAGNOSIS — R2689 Other abnormalities of gait and mobility: Secondary | ICD-10-CM | POA: Diagnosis not present

## 2014-05-07 DIAGNOSIS — R059 Cough, unspecified: Secondary | ICD-10-CM

## 2014-05-07 DIAGNOSIS — I10 Essential (primary) hypertension: Secondary | ICD-10-CM

## 2014-05-07 DIAGNOSIS — R05 Cough: Secondary | ICD-10-CM

## 2014-05-07 DIAGNOSIS — I25810 Atherosclerosis of coronary artery bypass graft(s) without angina pectoris: Secondary | ICD-10-CM | POA: Diagnosis not present

## 2014-05-07 DIAGNOSIS — S72001D Fracture of unspecified part of neck of right femur, subsequent encounter for closed fracture with routine healing: Secondary | ICD-10-CM | POA: Diagnosis not present

## 2014-05-07 DIAGNOSIS — J9610 Chronic respiratory failure, unspecified whether with hypoxia or hypercapnia: Secondary | ICD-10-CM | POA: Diagnosis not present

## 2014-05-07 DIAGNOSIS — I2510 Atherosclerotic heart disease of native coronary artery without angina pectoris: Secondary | ICD-10-CM

## 2014-05-07 DIAGNOSIS — I5032 Chronic diastolic (congestive) heart failure: Secondary | ICD-10-CM

## 2014-05-07 DIAGNOSIS — E119 Type 2 diabetes mellitus without complications: Secondary | ICD-10-CM | POA: Diagnosis not present

## 2014-05-07 NOTE — Assessment & Plan Note (Signed)
We have recommended she take Zofran 4 mg one hour before lunch and dinner. Zofran in the morning when necessary. This is what she would prefer as she has less nausea in the morning, able to eat some breakfast. Unable to exclude medication side effect. We will stop the zetia, ranexa and fenofibrate for now to see if symptoms will improve

## 2014-05-07 NOTE — Assessment & Plan Note (Signed)
Blood pressure is well controlled on today's visit. No changes made to the medications. 

## 2014-05-07 NOTE — Patient Instructions (Addendum)
You are doing well.  Please hold the ranexa, fenofibrate, zetia  Please take zofran 4 mg one hour before lunch and dinner for nausea Take zofran 4 mg one hour before breakfast as needed (as patient wishes)  Please add extra torsemide 40 mg at 2 pm on Monday, Wednesday and Friday, (for increasing leg swelling) in addition to torsemide 40 mg QAM  Please call us if you have new issues that need to be addressed before your next appt.  Your physician wants you to follow-up in: 3 months.  You will receive a reminder letter in the mail two months in advance. If you don't receive a letter, please call our office to schedule the follow-up appointment.

## 2014-05-07 NOTE — Telephone Encounter (Signed)
Order faxed.

## 2014-05-07 NOTE — Progress Notes (Signed)
Patient ID: WILL SCHIER, female    DOB: October 04, 1928, 81 y.o.   MRN: [id number]  HPI Comments: Ms. Fazzino is a very pleasant 81 -year-old woman with history of coronary artery disease, bypass surgery in 2004, hyperlipidemia, PCI 6 months after her bypass, repeat PCI one year later (She does report having a stroke after her cardiac catheterization), diabetes, hypertension, spinal stenosis,  who currently lives in a nursing home in [place], who has chronic unsteady gait , history of falls,  with chronic shortness of breath on oxygen who presents for routine followup of her shortness of breath, chronic diastolic CHF. Previous Pulmonary workup has revealed restrictive lung disease.  She currently lives at [facility].  In follow-up today, she had a mechanical fall February 2016 with hip fracture. Initially sent to [facility], transferred to [facility] at the request of family where she underwent hip repair with several screws.  Transfer back to rehabilitation. She is not full weightbearing at this time. Partial weightbearing with a walker. Gait continues to be unsteady. She reports that she is drinking significant fluids, has had worsening leg edema recently. Also reports having some sinus congestion, cough. Continues on 2 L nasal cannula oxygen, 3 L with walking Biggest complaint is chronic nausea which she reports is all of the time. Anorexia has been going on since November 2015 with significant weight loss since her prior clinic visit. She reports that Zofran is helping. She does not need it much before breakfast but needs it before lunch and dinner as she has significant nausea at this times  EKG on today's visit shows normal sinus rhythm with rate 75 bpm, nonspecific ST and T wave abnormality in the anterior precordial leads, inferior leads  Other past medical issues/history Chronic nausea. Previous hospitalization for diverticulitis per the patient.  Previously was on stool softeners, does not taking this  on a regular basis.  No falls or syncope.  No regular exercise program.  Denies any cough. On oxygen, level at 2 L even with ambulation.  Previous cardiac CTA in the past for atypical chest pain that showed: Patent SVG to D1 with poor distal runoff,  Occluded LIMA to LAD.  Distal LAD never visualized,  RCA and circumflex patient with multiple 50% or less calcfic Lesions,  Poor distal runoff from stented IM/LAD and small D1 supplied by SVG  Previous  Echocardiogram was essentially normal with normal ejection fraction greater than 34%, diastolic dysfunction, normal right ventricular systolic pressures  Stress test showed no ischemia with ejection fraction greater than 55%   12/2011 In the hospital, total cholesterol 146, LDL 58, HDL 65  Allergies  Allergen Reactions  . Actos [Pioglitazone Hydrochloride] Shortness Of Breath    CHF  . Atenolol Other (See Comments)    lethargy  . Codeine     REACTION: vomiting  . Eszopiclone Other (See Comments)    Nightmares  . Exenatide Nausea Only  . Morphine     REACTION: nausea  . Sulfa Antibiotics   . Vicodin [Hydrocodone-Acetaminophen] Nausea And Vomiting    Outpatient Encounter Prescriptions as of 05/07/2014  Medication Sig  . aspirin 81 MG tablet Take 81 mg by mouth daily.   Marland Kitchen atorvastatin (LIPITOR) 20 MG tablet TAKE 1 TABLET NIGHTLY  . Casanthranol-Docusate Sodium 30-100 MG CAPS as needed.    . cetirizine (ZYRTEC ALLERGY) 10 MG tablet Take 1 tablet (10 mg total) by mouth daily.  . citalopram (CELEXA) 10 MG tablet Take 1 tablet (10 mg total) by mouth  daily.  . clopidogrel (PLAVIX) 75 MG tablet TAKE 1 TABLET DAILY  . dimenhyDRINATE (DRAMAMINE) 50 MG tablet Take 50 mg by mouth at bedtime as needed.  . docusate sodium (COLACE) 100 MG capsule Take 1 capsule (100 mg total) by mouth daily.  . fluticasone (FLONASE) 50 MCG/ACT nasal spray Place 2 sprays into both nostrils daily.  Marland Kitchen glucose blood (FREESTYLE LITE) test strip Use to check sugar three  times daily Dx: 250.40  . glucose monitoring kit (FREESTYLE) monitoring kit 1 each by Does not apply route as needed for other.  . Insulin Glargine (LANTUS) 100 UNIT/ML Solostar Pen Inject 10 Units into the skin daily.  . Insulin Pen Needle 30G X 5 MM MISC Use as directed to inject insulin  . Lancets (FREESTYLE) lancets Use to check sugar three times daily Dx: 250.40  . LORazepam (ATIVAN) 1 MG tablet Take 1 tablet (1 mg total) by mouth at bedtime. insomnia  . metFORMIN (GLUCOPHAGE-XR) 500 MG 24 hr tablet Take 1 tablet (500 mg total) by mouth at bedtime.  . metoprolol tartrate (LOPRESSOR) 25 MG tablet TAKE 1 TABLET TWO TIMES DAILY  . Multiple Vitamin (MULTIVITAMIN) capsule Take 1 capsule by mouth daily.    . nitroGLYCERIN (NITROSTAT) 0.4 MG SL tablet DISSOLVE 1 TABLET UNDER THE TONGUE AS NEEDED  . ondansetron (ZOFRAN) 4 MG tablet Take 1 tablet (4 mg total) by mouth every 8 (eight) hours as needed for nausea or vomiting.  . potassium chloride SA (K-DUR,KLOR-CON) 20 MEQ tablet take 71mq once daily  . prednisoLONE sodium phosphate (INFLAMASE FORTE) 1 % ophthalmic solution Place 1 drop into both eyes daily.   . RABEprazole (ACIPHEX) 20 MG tablet TAKE 1 TABLET DAILY  . senna-docusate (SENOKOT-S) 8.6-50 MG per tablet Take 1 tablet by mouth as needed for mild constipation.  . sitaGLIPtin (JANUVIA) 50 MG tablet Take 1 tablet (50 mg total) by mouth daily.  . sodium fluoride (FLUORISHIELD) 1.1 % GEL dental gel Place 1 application onto teeth 3 (three) times daily after meals.  . torsemide (DEMADEX) 20 MG tablet Take 2 tablets (40 mg total) by mouth daily.  . traMADol (ULTRAM) 50 MG tablet Take 1 tablet (50 mg total) by mouth daily as needed.  . traZODone (DESYREL) 50 MG tablet Take 0.5 tablets (25 mg total) by mouth at bedtime as needed for sleep.  . Vitamin D, Ergocalciferol, (DRISDOL) 50000 UNITS CAPS capsule TAKE 1 CAPSULE ONCE EVERY WEEK  . [DISCONTINUED] fenofibrate micronized (ANTARA) 130 MG capsule  TAKE 1 CAPSULE DAILY  . [DISCONTINUED] RANEXA 1000 MG SR tablet TAKE 1 TABLET TWICE A DAY  . [DISCONTINUED] ZETIA 10 MG tablet TAKE 1 TABLET DAILY    Past Medical History  Diagnosis Date  . Hypertension   . Diabetes mellitus 2003    previously saw Dr. AElyse Hsu . Hyperlipidemia   . History of chicken pox     has had shingles shot  . History of diverticulitis of colon 1991, 1992, 1994  . History of kidney stones 1952  . CVA (cerebral infarction)     after a stent, minimal R residual weakness, affects speech when tired, ?mild dysequilibrium  . Urinary incontinence     stress/urge, per D. Tannenbaum/L[place]  . CAD (coronary artery disease)     severe, s/p 4 cardiac caths, minimally invasive CABG @[facility] , ICA stent 2004 then LAD stent 2005, MEDICAL MANAGEMENT ONLY, MAY USE REPEATED SL NITRO  . Breathing difficulty 2011    on O2 since 10/2009  . Syncope  2005    s/p w/u including CT scan and 24 hour Holter  . GERD (gastroesophageal reflux disease) 2004  . Dry senile macular degeneration     s/p corneal transplants ([facility])  . Benign essential tremor     toprol XL and remeron  . Herpes zoster ophthalmicus 2009    history  . Hearing loss of both ears 08/2010    audiology eval 08/2010 - mod sensorineural heaing loss, rec trial digital hearing aids and re eval yearly to monitor  . Vitamin D deficiency     on 50k units weekly  . Diastolic CHF 10/3568    grade 1, normal EF, mildly dilated LA, normal PA pressures  . Lumbar spinal stenosis     with severe spondylosis, s/p laminotomy  . Allergic rhinitis   . Chronic kidney disease     stage 3 kidney disease.  . Leg weakness, bilateral 2014    s/p neuro eval - thought multifactorial (diabetic neuropathy, lumbar stenosis, and chronic disease deconditioning).  Conservative management recommended  . Gait instability   . Wedge compression fracture of T10 vertebra 2015    by CXR  . Fracture of femoral neck, right 03/2014    s/p surgery     Past Surgical History  Procedure Laterality Date  . Cholecystectomy  1985  . Cervical fusion  2002    anterior decompression and fusion C6/7  . Coronary artery bypass graft  2004  . Percutaneous coronary stent intervention (pci-s)  2005    stents x 2 (intermediate and LAD), 2nd complicated by stroke  . Corneal transplant      x3  . Appendectomy  1948  . Breast biopsy  2001    x2, both benign, last mammo 2011, last pap smear 2011  . Cardiac catheterization  2009    x5  . Cataract extraction      bilateral, corneal dystrophy  . Cardiovascular stress test  2010    nuclear, normal  . Dexa  2005    normal  . Lumbar spine surgery  2010    laminotomy (L2/3, 3/4)  . Hip fracture surgery Right 03/2014    closed reduction percutaneous pinning of R femoral neck fracture ([facility], [facility])    Social History  reports that she has never smoked. She has never used smokeless tobacco. She reports that she does not drink alcohol or use illicit drugs.  Family History family history includes Cancer in her brother and mother; Heart disease in her father.   Review of Systems  Constitutional: Negative.   Respiratory: Positive for shortness of breath.   Cardiovascular: Positive for leg swelling.  Gastrointestinal: Negative.   Musculoskeletal: Positive for gait problem.  Neurological: Negative.   Hematological: Negative.   Psychiatric/Behavioral: Negative.   All other systems reviewed and are negative.   BP 120/70 mmHg  Pulse 75  Ht 5' 4"  (1.626 m)  Wt 126 lb 8 oz (57.38 kg)  BMI 21.70 kg/m2  Physical Exam  Constitutional: She is oriented to person, place, and time. She appears well-developed and well-nourished.  HENT:  Head: Normocephalic.  Nose: Nose normal.  Mouth/Throat: Oropharynx is clear and moist.  Eyes: Conjunctivae are normal. Pupils are equal, round, and reactive to light.  Neck: Normal range of motion. Neck supple. No JVD present.  Cardiovascular: Normal rate, regular  rhythm, S1 normal, S2 normal and intact distal pulses.  Exam reveals no gallop and no friction rub.   Murmur heard.  Crescendo systolic murmur is present with  a grade of 2/6  Trace pitting edema around the ankles, low shins  Pulmonary/Chest: Effort normal. No respiratory distress. She has decreased breath sounds. She has no wheezes. She has rales. She exhibits no tenderness.  Abdominal: Soft. Bowel sounds are normal. She exhibits no distension. There is no tenderness.  Musculoskeletal: Normal range of motion. She exhibits no edema or tenderness.  Lymphadenopathy:    She has no cervical adenopathy.  Neurological: She is alert and oriented to person, place, and time. Coordination normal.  Skin: Skin is warm and dry. No rash noted. No erythema.  Psychiatric: She has a normal mood and affect. Her behavior is normal. Judgment and thought content normal.    Assessment and Plan  Nursing note and vitals reviewed.

## 2014-05-07 NOTE — Assessment & Plan Note (Signed)
She has significant fluid intake, worsening leg edema per the patient and daughter. Recommended she add extra torsemide 40 mg in the afternoon on Monday Wednesday and Friday. Try to cut back on her fluids. She reports having a dry mouth all the time.

## 2014-05-07 NOTE — Assessment & Plan Note (Signed)
Significant deep cough, sounds thick on today's visit. Recommended to the patient and daughter that if cough gets worse, that they call our office or primary care she may need antibiotic for bronchitis.

## 2014-05-07 NOTE — Assessment & Plan Note (Signed)
Currently with no symptoms of angina. No further workup at this time. Continue current medication regimen. 

## 2014-05-08 ENCOUNTER — Telehealth: Payer: Self-pay

## 2014-05-08 DIAGNOSIS — R2689 Other abnormalities of gait and mobility: Secondary | ICD-10-CM | POA: Diagnosis not present

## 2014-05-08 DIAGNOSIS — S72001D Fracture of unspecified part of neck of right femur, subsequent encounter for closed fracture with routine healing: Secondary | ICD-10-CM | POA: Diagnosis not present

## 2014-05-08 DIAGNOSIS — I5032 Chronic diastolic (congestive) heart failure: Secondary | ICD-10-CM | POA: Diagnosis not present

## 2014-05-08 DIAGNOSIS — I2510 Atherosclerotic heart disease of native coronary artery without angina pectoris: Secondary | ICD-10-CM | POA: Diagnosis not present

## 2014-05-08 DIAGNOSIS — J9610 Chronic respiratory failure, unspecified whether with hypoxia or hypercapnia: Secondary | ICD-10-CM | POA: Diagnosis not present

## 2014-05-08 DIAGNOSIS — E119 Type 2 diabetes mellitus without complications: Secondary | ICD-10-CM | POA: Diagnosis not present

## 2014-05-08 NOTE — Telephone Encounter (Signed)
Spoke w/ Charlsie Quest.  Clarified that pt is to discontinue ranexa, fenofibrate and zetia.  She is appreciative and will call back w/ any questions or concerns.

## 2014-05-08 NOTE — Telephone Encounter (Signed)
Nurse needs clarification on how long to hold pt meds. Please advise

## 2014-05-09 DIAGNOSIS — E119 Type 2 diabetes mellitus without complications: Secondary | ICD-10-CM | POA: Diagnosis not present

## 2014-05-09 DIAGNOSIS — J9610 Chronic respiratory failure, unspecified whether with hypoxia or hypercapnia: Secondary | ICD-10-CM | POA: Diagnosis not present

## 2014-05-09 DIAGNOSIS — S72001D Fracture of unspecified part of neck of right femur, subsequent encounter for closed fracture with routine healing: Secondary | ICD-10-CM | POA: Diagnosis not present

## 2014-05-09 DIAGNOSIS — I2510 Atherosclerotic heart disease of native coronary artery without angina pectoris: Secondary | ICD-10-CM | POA: Diagnosis not present

## 2014-05-09 DIAGNOSIS — R2689 Other abnormalities of gait and mobility: Secondary | ICD-10-CM | POA: Diagnosis not present

## 2014-05-09 DIAGNOSIS — I5032 Chronic diastolic (congestive) heart failure: Secondary | ICD-10-CM | POA: Diagnosis not present

## 2014-05-10 DIAGNOSIS — S72001A Fracture of unspecified part of neck of right femur, initial encounter for closed fracture: Secondary | ICD-10-CM | POA: Diagnosis not present

## 2014-05-10 DIAGNOSIS — S72011D Unspecified intracapsular fracture of right femur, subsequent encounter for closed fracture with routine healing: Secondary | ICD-10-CM | POA: Diagnosis not present

## 2014-05-13 ENCOUNTER — Other Ambulatory Visit: Payer: Self-pay | Admitting: Family Medicine

## 2014-05-14 DIAGNOSIS — I5032 Chronic diastolic (congestive) heart failure: Secondary | ICD-10-CM | POA: Diagnosis not present

## 2014-05-14 DIAGNOSIS — E119 Type 2 diabetes mellitus without complications: Secondary | ICD-10-CM | POA: Diagnosis not present

## 2014-05-14 DIAGNOSIS — I2510 Atherosclerotic heart disease of native coronary artery without angina pectoris: Secondary | ICD-10-CM | POA: Diagnosis not present

## 2014-05-14 DIAGNOSIS — S72001D Fracture of unspecified part of neck of right femur, subsequent encounter for closed fracture with routine healing: Secondary | ICD-10-CM | POA: Diagnosis not present

## 2014-05-14 DIAGNOSIS — R2689 Other abnormalities of gait and mobility: Secondary | ICD-10-CM | POA: Diagnosis not present

## 2014-05-14 DIAGNOSIS — J9610 Chronic respiratory failure, unspecified whether with hypoxia or hypercapnia: Secondary | ICD-10-CM | POA: Diagnosis not present

## 2014-05-15 ENCOUNTER — Telehealth: Payer: Self-pay | Admitting: Family Medicine

## 2014-05-15 DIAGNOSIS — S72001D Fracture of unspecified part of neck of right femur, subsequent encounter for closed fracture with routine healing: Secondary | ICD-10-CM | POA: Diagnosis not present

## 2014-05-15 DIAGNOSIS — I2510 Atherosclerotic heart disease of native coronary artery without angina pectoris: Secondary | ICD-10-CM | POA: Diagnosis not present

## 2014-05-15 DIAGNOSIS — I5032 Chronic diastolic (congestive) heart failure: Secondary | ICD-10-CM | POA: Diagnosis not present

## 2014-05-15 DIAGNOSIS — J9610 Chronic respiratory failure, unspecified whether with hypoxia or hypercapnia: Secondary | ICD-10-CM | POA: Diagnosis not present

## 2014-05-15 DIAGNOSIS — R2689 Other abnormalities of gait and mobility: Secondary | ICD-10-CM | POA: Diagnosis not present

## 2014-05-15 DIAGNOSIS — E119 Type 2 diabetes mellitus without complications: Secondary | ICD-10-CM | POA: Diagnosis not present

## 2014-05-15 NOTE — Telephone Encounter (Signed)
Cannot treat with abx over the phone without eval  If her assisted living can do a ua there - go forward and get [place] a result Otherwise arrange visit when she can have transportation or if severe symptoms call EMS

## 2014-05-15 NOTE — Telephone Encounter (Signed)
[place] Patient Name: Christine Blanchard DOB: 11/14/1928 Initial Comment Caller states she has a bladder infection, chills and achy bones. Hoping for something to be called in. Nurse Assessment Nurse: Martyn Ehrich RN, Felicia Date/Time (Eastern Time): 05/15/2014 4:03:07 PM Confirm and document reason for call. If symptomatic, describe symptoms. ---PT is chilling with achy bones. When she goes to the bathroom near the end of it the bladder contracts and it is very uncomfortable - and in urethra. No fever. Night sweats. They took her temp at assisted living - no fever. Has the patient traveled out of the country within the last 30 days? ---No Does the patient require triage? ---Yes Related visit to physician within the last 2 weeks? ---NoDoes the PT have any chronic conditions? (i.e. diabetes, asthma, etc.) ---Yes List chronic conditions. ---DM, heart issues, on oxygen related to her heart for 4 yrs and has CHF (hers is 3 1/2 out of 5). Loosing wt bc nauseated since Nov, lasix damaged kidneys a little Guidelines Guideline Title Affirmed Question Affirmed Notes Urination Pain - Female Side (flank) or lower back pain present Final Disposition User See Physician within 4 Hours (or PCP triage) Martyn Ehrich, RN, Felicia Comments CALLER REFUSED TO GET APPOINTMENT OR GO TO [facility] - SHE IS AT ASSISTED LIVING AND THEY ONLY TRANSPORT PEOPLE IN THE FIRST HALF OF DAY TO MD. Marlana Salvage WANTED TO Palmer (GUITERREZ' NURSE) TOLD HER WILL SEND THIS MESSAGE TO THE OFFICE AND SOMEONE WILL CALL HER Told caller they don't order antx over the phone Spoke with Rena LPN in the office and conveyed her symptomsand transporation issues. She said - send the telephone record and she will give it to MD who is still here - not her MD bc he is not There Call Id: [id number]

## 2014-05-15 NOTE — Telephone Encounter (Signed)
Patient notified as instructed by telephone and verbalized understanding. Patient requested an appointment with Dr. Danise Mina tomorrow 05/16/14. Appointment scheduled.

## 2014-05-15 NOTE — Telephone Encounter (Signed)
Thank you. Will see tomorrow.  

## 2014-05-16 ENCOUNTER — Ambulatory Visit (INDEPENDENT_AMBULATORY_CARE_PROVIDER_SITE_OTHER): Payer: Medicare Other | Admitting: Family Medicine

## 2014-05-16 ENCOUNTER — Encounter: Payer: Self-pay | Admitting: Family Medicine

## 2014-05-16 VITALS — BP 100/52 | HR 68 | Temp 97.3°F | Wt 123.1 lb

## 2014-05-16 DIAGNOSIS — N3289 Other specified disorders of bladder: Secondary | ICD-10-CM | POA: Insufficient documentation

## 2014-05-16 DIAGNOSIS — R110 Nausea: Secondary | ICD-10-CM

## 2014-05-16 DIAGNOSIS — I25810 Atherosclerosis of coronary artery bypass graft(s) without angina pectoris: Secondary | ICD-10-CM | POA: Diagnosis not present

## 2014-05-16 DIAGNOSIS — R300 Dysuria: Secondary | ICD-10-CM

## 2014-05-16 LAB — POCT URINALYSIS DIPSTICK
Bilirubin, UA: NEGATIVE
Blood, UA: NEGATIVE
Glucose, UA: NEGATIVE
KETONES UA: NEGATIVE
Leukocytes, UA: NEGATIVE
Nitrite, UA: POSITIVE
PROTEIN UA: NEGATIVE
Spec Grav, UA: 1.025
Urobilinogen, UA: 0.2
pH, UA: 5

## 2014-05-16 MED ORDER — PHENAZOPYRIDINE HCL 100 MG PO TABS: 100.0000 mg | ORAL_TABLET | Freq: Two times a day (BID) | ORAL | Status: DC | PRN

## 2014-05-16 NOTE — Progress Notes (Signed)
Pre visit review using our clinic review tool, if applicable. No additional management support is needed unless otherwise documented below in the visit note. 

## 2014-05-16 NOTE — Assessment & Plan Note (Signed)
Persistent nausea despite significant med changes. Check UCx today.

## 2014-05-16 NOTE — Assessment & Plan Note (Signed)
UA with nitrates but micro benign. Send culture. In interim may use pyridium prn, avoid bladder irritants like caffeine. Will notify pt of results when they arrive.

## 2014-05-16 NOTE — Progress Notes (Signed)
BP 100/52 mmHg  Pulse 68  Temp(Src) 97.3 F (36.3 C) (Oral)  Wt 123 lb 1.9 oz (55.847 kg)   CC: ?UTI  Subjective:    Patient ID: Christine Blanchard, female    DOB: 19-Sep-1928, 81 y.o.   MRN: [id number]  HPI: Christine Blanchard is a 81 y.o. female presenting on 05/16/2014 for Urinary Tract Infection   See yesterday's phone note - chills, body and bone pains, night sweats, bladder spasm at end of stream. Staying nauseated.   No fevers, vomiting, flank pain, suprapubic abdominal pain, dysuria, urgency or frequency.   BP Readings from Last 3 Encounters:  05/16/14 100/52  05/07/14 120/70  04/25/14 112/62    Relevant past medical, surgical, family and social history reviewed and updated as indicated. Interim medical history since our last visit reviewed. Allergies and medications reviewed and updated. Current Outpatient Prescriptions on File Prior to Visit  Medication Sig  . aspirin 81 MG tablet Take 81 mg by mouth daily.   Marland Kitchen atorvastatin (LIPITOR) 20 MG tablet TAKE 1 TABLET NIGHTLY  . cetirizine (ZYRTEC ALLERGY) 10 MG tablet Take 1 tablet (10 mg total) by mouth daily.  . citalopram (CELEXA) 10 MG tablet TAKE 1 TABLET DAILY  . clopidogrel (PLAVIX) 75 MG tablet TAKE 1 TABLET DAILY  . dimenhyDRINATE (DRAMAMINE) 50 MG tablet Take 50 mg by mouth at bedtime as needed.  . docusate sodium (COLACE) 100 MG capsule Take 1 capsule (100 mg total) by mouth daily.  . fluticasone (FLONASE) 50 MCG/ACT nasal spray Place 2 sprays into both nostrils daily.  Marland Kitchen glucose blood (FREESTYLE LITE) test strip Use to check sugar three times daily Dx: 250.40  . glucose monitoring kit (FREESTYLE) monitoring kit 1 each by Does not apply route as needed for other.  . Insulin Glargine (LANTUS) 100 UNIT/ML Solostar Pen Inject 10 Units into the skin daily.  . Insulin Pen Needle 30G X 5 MM MISC Use as directed to inject insulin  . Lancets (FREESTYLE) lancets Use to check sugar three times daily Dx: 250.40  . LORazepam  (ATIVAN) 1 MG tablet Take 1 tablet (1 mg total) by mouth at bedtime. insomnia  . metFORMIN (GLUCOPHAGE-XR) 500 MG 24 hr tablet Take 1 tablet (500 mg total) by mouth at bedtime.  . metoprolol tartrate (LOPRESSOR) 25 MG tablet TAKE 1 TABLET TWO TIMES DAILY  . Multiple Vitamin (MULTIVITAMIN) capsule Take 1 capsule by mouth daily.    . nitroGLYCERIN (NITROSTAT) 0.4 MG SL tablet DISSOLVE 1 TABLET UNDER THE TONGUE AS NEEDED  . ondansetron (ZOFRAN) 4 MG tablet Take 1 tablet (4 mg total) by mouth every 8 (eight) hours as needed for nausea or vomiting.  . potassium chloride SA (K-DUR,KLOR-CON) 20 MEQ tablet take 53mq once daily  . prednisoLONE sodium phosphate (INFLAMASE FORTE) 1 % ophthalmic solution Place 1 drop into both eyes daily.   . RABEprazole (ACIPHEX) 20 MG tablet TAKE 1 TABLET DAILY  . sitaGLIPtin (JANUVIA) 50 MG tablet Take 1 tablet (50 mg total) by mouth daily.  . sodium fluoride (FLUORISHIELD) 1.1 % GEL dental gel Place 1 application onto teeth 3 (three) times daily after meals.  . torsemide (DEMADEX) 20 MG tablet Take 2 tablets (40 mg total) by mouth daily.  . traMADol (ULTRAM) 50 MG tablet Take 1 tablet (50 mg total) by mouth daily as needed.  . traZODone (DESYREL) 50 MG tablet Take 0.5 tablets (25 mg total) by mouth at bedtime as needed for sleep.  .Sarajane MarekSodium 30-100 MG  CAPS as needed.    . senna-docusate (SENOKOT-S) 8.6-50 MG per tablet Take 1 tablet by mouth as needed for mild constipation.  . Vitamin D, Ergocalciferol, (DRISDOL) 50000 UNITS CAPS capsule TAKE 1 CAPSULE ONCE EVERY WEEK (Patient not taking: Reported on 05/16/2014)   No current facility-administered medications on file prior to visit.    Review of Systems Per HPI unless specifically indicated above     Objective:    BP 100/52 mmHg  Pulse 68  Temp(Src) 97.3 F (36.3 C) (Oral)  Wt 123 lb 1.9 oz (55.847 kg)  Wt Readings from Last 3 Encounters:  05/16/14 123 lb 1.9 oz (55.847 kg)  05/07/14 126  lb 8 oz (57.38 kg)  04/25/14 117 lb 6.4 oz (53.252 kg)    Physical Exam  Constitutional: She appears well-developed and well-nourished. No distress.  Abdominal: Soft. Normal appearance and bowel sounds are normal. She exhibits no distension and no mass. There is no hepatosplenomegaly. There is tenderness (mild) in the right lower quadrant. There is no rigidity, no rebound, no guarding, no CVA tenderness and negative Murphy's sign.  Musculoskeletal: She exhibits no edema.  Skin: Skin is warm and dry. No rash noted.  Psychiatric: She has a normal mood and affect.  Nursing note and vitals reviewed.  Results for orders placed or performed in visit on 05/16/14  POCT Urinalysis Dipstick  Result Value Ref Range   Color, UA Yellow    Clarity, UA Clear    Glucose, UA Negative    Bilirubin, UA Negative    Ketones, UA Negative    Spec Grav, UA 1.025    Blood, UA Negative    pH, UA 5.0    Protein, UA Negative    Urobilinogen, UA 0.2    Nitrite, UA Positive    Leukocytes, UA Negative    Lab Results  Component Value Date   CREATININE 0.85 04/25/2014      Assessment & Plan:   Problem List Items Addressed This Visit    Nausea    Persistent nausea despite significant med changes. Check UCx today.      Bladder spasm - Primary    UA with nitrates but micro benign. Send culture. In interim may use pyridium prn, avoid bladder irritants like caffeine. Will notify pt of results when they arrive.      Relevant Orders   Urine culture    Other Visit Diagnoses    Dysuria        Relevant Orders    POCT Urinalysis Dipstick (Completed)    Urine culture        Follow up plan: Return if symptoms worsen or fail to improve.

## 2014-05-16 NOTE — Patient Instructions (Signed)
Urine overall looking ok today. Urine culture sent and we will call you with results. In interim, may use pyridium up to twice daily as needed for bladder spasm. Avoid bladder irritants like caffeine, acidic foods, spicy foods, artificial sweeteners, and carbonated beverages.

## 2014-05-19 LAB — URINE CULTURE: Colony Count: >=100000

## 2014-05-20 DIAGNOSIS — I5032 Chronic diastolic (congestive) heart failure: Secondary | ICD-10-CM | POA: Diagnosis not present

## 2014-05-20 DIAGNOSIS — J9610 Chronic respiratory failure, unspecified whether with hypoxia or hypercapnia: Secondary | ICD-10-CM | POA: Diagnosis not present

## 2014-05-20 DIAGNOSIS — E119 Type 2 diabetes mellitus without complications: Secondary | ICD-10-CM | POA: Diagnosis not present

## 2014-05-20 DIAGNOSIS — S72001D Fracture of unspecified part of neck of right femur, subsequent encounter for closed fracture with routine healing: Secondary | ICD-10-CM | POA: Diagnosis not present

## 2014-05-20 DIAGNOSIS — I2510 Atherosclerotic heart disease of native coronary artery without angina pectoris: Secondary | ICD-10-CM | POA: Diagnosis not present

## 2014-05-20 DIAGNOSIS — R2689 Other abnormalities of gait and mobility: Secondary | ICD-10-CM | POA: Diagnosis not present

## 2014-05-21 ENCOUNTER — Other Ambulatory Visit: Payer: Self-pay | Admitting: Family Medicine

## 2014-05-21 MED ORDER — CIPROFLOXACIN HCL 250 MG PO TABS: ORAL_TABLET | ORAL | Status: DC

## 2014-05-21 NOTE — H&P (Signed)
PATIENT NAME:  Christine Blanchard, Christine Blanchard MR#:  [id number] DATE OF BIRTH:  Jul 23, 1928  DATE OF ADMISSION:  12/12/2011  PRIMARY CARE PHYSICIAN: Dr. Shella Spearing at [place]: Dr. Rockey Situ   PULMONARY: Dr. Jeral Fruit [facility]    REQUESTING PHYSICIAN: Dr. Benjaman Lobe   CHIEF COMPLAINT: Chest pain and shortness of breath.   HISTORY OF PRESENT ILLNESS: The patient is an 81 year old female with a known history of coronary artery disease status post coronary artery bypass graft about eight years ago at [facility], hypertension, diabetes, and history of stroke who is being admitted for acute on chronic unstable angina. The patient has known history of chronic angina but has been getting worse over the last couple of weeks. She was at one of the music plays 10 days ago when she was having a lot of pain, took about 20 sublingual nitroglycerin to relieve some of her pain. Since last Thursday she has been feeling more short of breath, noticed some swelling in the leg, also had some intermittent nausea. She denies any fever or cough. She has a pulse oxygenation meter at home where she noted her heart rate to be in the 40's and 50's and oxygen saturation in the low 90's on 2 liters oxygen. Her family insisted for her to come to the Emergency Department as she was getting worse and could not bear her chest pain. While in the ED she still has about 5 to 6 out of 10 pain and is being admitted for further evaluation and management.   PAST MEDICAL HISTORY:  1. Hypertension.  2. Hyperlipidemia.  3. Depression.  4. History of stroke.  5. Diabetes.  6. Coronary artery disease, status post coronary artery bypass graft about eight years ago at [facility].  7. Endovascular graft placement.    PAST SURGICAL HISTORY:  1. Coronary artery bypass graft about eight years ago.  2. Stenting x2.  3. Right eye cornea implant.   ALLERGIES: Actos, atenolol, Byetta, codeine, eszopiclone, exenatide, Lunesta, morphine, sulfa.     SOCIAL HISTORY: She lives at [facility] in [place]. No smoking or alcohol use.   FAMILY HISTORY: Father with heart disease, diabetes, died at the age of 62.   REVIEW OF SYSTEMS: CONSTITUTIONAL: Positive for fatigue and weakness. No weight loss or weight gain. No fever. EYES: No blurred or double vision. She does have a history of corneal implant which has failed. No pain or redness. ENT: No tinnitus or ear pain. She does have decreased hearing and wears hearing aid which she has forgotten now at home. RESPIRATORY: No cough, wheezing, or hemoptysis. Known for chronic COPD, follows with Dr. Lake Bells and wears 2 liters oxygen chronically 24/7. CARDIOVASCULAR: Positive for chest pain. No orthopnea. Positive for edema and dyspnea on exertion. GI: Positive for nausea. No vomiting or diarrhea. No abdominal pain. GU: No dysuria or hematuria. ENDOCRINE: No polyuria or nocturia. HEMATOLOGY: No anemia or easy bruising. SKIN: No rash or lesion. MUSCULOSKELETAL: No arthritis or muscle cramp. NEUROLOGIC: No tingling, numbness. Positive for weakness. Uses a walker at home when she walks outside home. PSYCHIATRIC: No history of anxiety. Positive for depression.   PHYSICAL EXAMINATION:   VITAL SIGNS: Temperature 97.9, heart rate 66 per minute, respirations 22 per minute, blood pressure 170/77 mmHg. She is saturating 95% on 2 liters oxygen via nasal cannula.   GENERAL: The patient is an 81 year old female lying in the bed comfortably without any acute distress.   EYES: Pupils equal, round, and reactive to light  and accommodation. No scleral icterus. Extraocular muscles are intact.   HEENT: Head atraumatic, normocephalic. Oropharynx and nasopharynx clear.   NECK: Supple. No jugular venous distention. No thyroid enlargement or tenderness.   LUNGS: Clear to auscultation bilaterally. No wheezing, rales, rhonchi, or crepitation.   CARDIOVASCULAR: S1, S2 normal. No murmurs, rubs, or gallops.    ABDOMEN: Soft, nontender, nondistended. Bowel sounds present. No organomegaly or mass.   EXTREMITIES: Trace pedal edema. No cyanosis or clubbing.   NEUROLOGIC: Nonfocal examination. Cranial nerves II through XII intact. Muscle strength 5/5. Extremity sensation intact.   PSYCHIATRIC: The patient is alert and oriented to time, place, and person x3.   SKIN: She has old healed scars from cardiac bypass on the mid chest. Also has some vein stripping in her lower extremities which is also well healed scar. No other rash or lesion seen. No ulcers.   LABORATORY PANEL: Normal BMP. Normal first set of cardiac enzymes. Normal CBC.   Chest x-ray while in the ED showed bilateral diffuse interstitial thickening likely representing interstitial edema versus interstitial pneumonitis.   EKG shows normal sinus rhythm, nonspecific ST-T changes, prolonged QT of 440 ms.   IMPRESSION AND PLAN:  1. Unstable angina, acute on chronic. Will start her on nitroglycerin. Continue aspirin, Plavix, beta-blocker, and statin. Will consult Cardiology. Hold off full dose anticoagulation at this time. Will order Myoview for the morning. She may need cardiac cath, although the patient states that she cannot get cath as she had a stroke during previous cath. Will leave the decision up to Cardiology. 2. Exertional dyspnea likely due to underlying COPD but cannot rule out new onset CHF. Will get 2-D echocardiogram and consult Cardiology. Will continue Lasix as per her family member who is at the bedside. The patient has had an echocardiogram in the past and had a normal ejection fraction, although I do not have any records. 3. Diabetes mellitus. Will continue insulin Lantus. Hold her metformin due to possible need for IV dye for Myoview and/or cardiac cath. Start her on sliding scale insulin. Order diabetic diet.  4. Hypertension. Will continue home medication. Monitor her blood pressure. Adjust medication as needed.  5. Coronary  artery disease, status post coronary artery bypass graft and stenting. Continue aspirin, Plavix, statin. Consult Cardiology and order Myoview for the morning.  6. Depression. Will continue Celexa at this time.  7. Hyperlipidemia. Will continue statin. Check lipid profile. Will also continue Zetia along with fenofibrate.  8. Chronic obstructive pulmonary disease. Will continue Flonase and order DuoNeb nebulizer breathing treatment.   CODE STATUS: FULL CODE.   TOTAL TIME TAKING CARE OF THIS PATIENT: 55 minutes.   I have discussed the case with Dr. Angelena Form at [facility] who will pass on the information to Dr. Rockey Situ to see her in the morning.   ____________________________ Lucina Mellow. Manuella Ghazi, MD vss:drc D: 12/12/2011 22:41:46 ET T: 12/13/2011 07:43:07 ET JOB#: [id number]  cc: Artelia Game S. Manuella Ghazi, MD, <Dictator> Ria Bush, MD Juanito Doom, MD Minna Merritts, MD Lucina Mellow [facility] MD ELECTRONICALLY SIGNED 12/14/2011 20:29

## 2014-05-21 NOTE — Discharge Summary (Signed)
PATIENT NAME:  Christine Blanchard, Christine Blanchard MR#:  [id number] DATE OF BIRTH:  05/20/1928  DATE OF ADMISSION:  12/12/2011 DATE OF DISCHARGE:  12/13/2011  PRIMARY CARE PHYSICIAN: Dr. Danise Mina   PRIMARY CARDIOLOGIST: Dr. Rockey Situ    DISCHARGE DIAGNOSES:  1. Acute on chronic diastolic congestive heart failure.  2. Atypical chest pain.  3. Chronic respiratory failure.   CONSULT: Dr. Fletcher Anon of Cardiology    IMAGING STUDIES: CT scan stress test which showed no reversible ischemia.   ADMITTING HISTORY AND PHYSICAL: Please see detailed history and physical dictated by Dr. Manuella Ghazi. In brief, this is an 81 year old female patient with history of known coronary artery disease who presented to the Emergency Room complaining of angina and shortness of breath. The patient was found to be short of breath in CHF, was admitted to the hospitalist service for further work-up along with being scheduled for a stress test and consulting Cardiology, Dr. Fletcher Anon.   HOSPITAL COURSE: The patient had a 2-D echocardiogram which showed diastolic CHF after which her Lasix has been changed from every other day to once a day at the time of discharge. Her other medications have been continued as per Dr. Fletcher Anon. For chest pain, her stress test was negative. She was discharged home to follow-up with her cardiologist. The patient has also been given counseling to reduce her fluid intake, daily fluids less than 2 liters, along with low sodium and low cholesterol diet.   On the day of discharge, the patient was found to have temperature of 97.9, pulse 84, blood pressure 115/75, saturating 96% on 2 liters oxygen with lungs showing no crackles on exam and was discharged home in a stable condition.    DISCHARGE MEDICATIONS:  1. Lasix 40 mg oral once a day.  2. Prednisolone eyedrops one drop in each affected eye once a day.  3. Potassium chloride 20 mEq oral once a day.  4. Nitrostat 0.4 mg sublingual as needed for chest pain.  5. Remeron 15 mg half a  tablet oral once a day at bedtime.  6. Lorazepam 2 mg oral once a day at bedtime.  7. Fenofibrate 130 mg oral once a day.  8. Citalopram 20 mg oral once a day.  9. Sitagliptin 100 mg oral once a day.  10. Ranolazine 1000 mg oral 2 times a day.  11. Rabeprazole 20 mg oral once a day.  12. Multivitamin 1 tablet oral once a day.  13. Metoprolol tartrate 25 mg oral 2 times a day.  14. Metformin 1000 mg oral once a day.  15. Loratadine 10 mg oral once a day.  16. Isosorbide mononitrate 60 mg oral once a day.  17. Lantus 20 units subcutaneous once a day.  18. Fluticasone two sprays nasally once a day.  19. Ezetimibe 10 mg oral once a day.  20. Ergocalciferol 50,000 international units once a week.  21. Plavix 75 mg oral once a day.  22. Atorvastatin 20 mg oral once a day.  23. Aspirin 325 mg oral once a day.  24. Ventolin HFA 2 puffs inhaled every six hours as needed for wheezing.   DISCHARGE INSTRUCTIONS:  1. Follow-up with Dr. Rockey Situ in one week.  2. The patient was discharged home on a diabetic cardiac diet.  3. Activity as tolerated.  4. Continue oxygen 2 liters at home.  5. The patient is to call her doctor or return to the Emergency Room if she has any worsening of her symptoms.  6. The patient was discharged on  less than 2 liters a day of fluids and low salt diet.       TIME SPENT: Time spent today on the day of discharge in coordination of care was 35 minutes.   ____________________________ Leia Alf Maela Takeda, MD srs:drc D: 12/14/2011 14:58:57 ET T: 12/15/2011 10:36:19 ET JOB#: [id number]  cc: Alveta Heimlich R. Askari Kinley, MD, <Dictator> Ria Bush, MD [place] MD ELECTRONICALLY SIGNED 12/18/2011 9:00

## 2014-05-21 NOTE — Consult Note (Signed)
PATIENT NAME:  Christine Blanchard, Christine Blanchard MR#:  [id number] DATE OF BIRTH:  [birth year]  DATE OF CONSULTATION:  12/13/2011  REFERRING PHYSICIAN:  Dr. Manuella Ghazi CONSULTING PHYSICIAN:  Rogue Jury A. Fletcher Anon, MD  PRIMARY CARE PHYSICIAN: Dr. Danise Mina  PRIMARY CARDIOLOGIST: Dr. Rockey Situ  REASON FOR CONSULTATION: Chest pain.   HISTORY OF PRESENT ILLNESS: This is an 81 year old [place] female with known history of coronary artery disease status post coronary artery bypass graft surgery in 2004 with multiple PCIs the following few years after that due to graft failures. She has also known hyperlipidemia, diabetes, and hypertension. Most recent cardiac catheterization was complicated by a stroke. The patient has known history of chronic angina. She underwent CTA of the coronary arteries last year which showed patent saphenous vein graft to first diagonal, occluded LIMA to LAD. The distal LAD was not well visualized. The native RCA and circumflex had multiple moderate lesions. The patient is being treated medically for angina. However, her symptoms have been worsening over the last month with frequent use of sublingual nitroglycerin. She complains of exertional fatigue and chest tightness, which has been associated with difficulty breathing.   PAST MEDICAL HISTORY:  1. Coronary artery disease as outlined above.  2. Hypertension.  3. Hyperlipidemia.  4. Depression.  5. History of stroke after cardiac catheterization.  6. Type 2 diabetes.  7. Endovascular graft placement.   ALLERGIES: Actos, atenolol, Byetta, codeine, Lunesta, morphine, sulfa, and other drugs reviewed in her chart.   SOCIAL HISTORY: She lives at [facility] in [place].  She denies any smoking or alcohol use.   FAMILY HISTORY: Remarkable for coronary artery disease, which was not premature.   HOME MEDICATIONS:  1. Albuterol.  2. Aspirin 325 daily.  3. Atorvastatin 20 mg daily.  4. Celexa 20 mg daily.  5. Plavix 75 mg daily.  6. Vitamin D.   7. Zetia 10 mg daily.  8. Fenofibrate 130 mg once daily.  9. Flonase.  10. Lasix 40 mg daily.  11. Insulin glargine.  12. Imdur 60 mg daily.  13. Loratadine.  14. Lorazepam 2 mg as needed.  15. Metformin 500 mg once daily.  16. Metoprolol tartrate 25 mg twice daily.  17. Nitrostat sublingual as needed.  18. Ranexa 1000 mg twice daily.  19. Januvia.  REVIEW OF SYSTEMS: A 10-point review of systems was performed. It is negative other than what is mentioned in the history of present illness.   PHYSICAL EXAMINATION:  GENERAL: The patient appears to be at her stated age and in no acute distress.   VITAL SIGNS: Temperature 98.3, pulse 62, respiratory rate 18, blood pressure 119/69, oxygen saturation is 94% on 2 liters nasal cannula.   HEENT: Normocephalic, atraumatic.   NECK: No jugular venous distention or carotid bruits.   RESPIRATORY: Normal respiratory effort with no use of accessory muscles. Auscultation reveals normal breath sounds.   CARDIOVASCULAR: Normal PMI. Normal S1 and S2 with no gallops or murmurs.   ABDOMEN: Benign, nontender, and nondistended.   EXTREMITIES: No clubbing, cyanosis, or edema.   SKIN: Warm and dry with no rash.   PSYCHIATRIC: She is alert, oriented times three with normal mood and affect.   LABORATORY, DIAGNOSTIC, AND RADIOLOGICAL DATA: Her renal function showed a creatinine of 1.18 with a BUN of 17. Cardiac enzymes are negative. CBC is normal. ECG showed normal sinus rhythm with no significant ST changes.   IMPRESSION:  1. Accelerated angina.  2. Coronary artery disease status post CABG in 2004 with multiple PCIs after  that.  3. History of previous stroke after cardiac catheterization.  4. Significant exertional dyspnea.   RECOMMENDATIONS: The patient has accelerated angina which is requiring frequent use of sublingual nitroglycerin. She has known complicated cardiac history. Due to her worsening symptoms, I agree with pharmacologic nuclear  stress test to evaluate the degree of ischemia and to see if an intervention is possible. She is already on maximal medical therapy. An echo also will be helpful to evaluate her dyspnea. Further cardiac recommendations to follow after the above testing. Of note, the patient is very hesitant about undergoing another heart catheterization if needed due to previous complications.   ____________________________ Mertie Clause Fletcher Anon, MD maa:bjt D: 12/13/2011 08:59:45 ET T: 12/13/2011 10:02:18 ET JOB#: [id number]  cc: Rogue Jury A. Fletcher Anon, MD, <Dictator> Minna Merritts, MD Ria Bush, MD [place] MD ELECTRONICALLY SIGNED 12/27/2011 8:45

## 2014-05-21 NOTE — Consult Note (Signed)
Brief Consult Note: Diagnosis: accelerated angina, known CAD s/p CABG with multiple PCIs after. Most recent cath was complicated by a stroke.   Patient was seen by consultant.   Consult note dictated.   Comments: Agree with a nuclear stress test today to evaluate burden of ischemia. I discussed this with patient. She is very hesitant to undergo cardiac cath if needed.  Electronic Signatures: Kathlyn Sacramento (MD)  (Signed 11-Nov-13 08:46)  Authored: Brief Consult Note   Last Updated: 11-Nov-13 08:46 by Kathlyn Sacramento (MD)

## 2014-05-22 DIAGNOSIS — E119 Type 2 diabetes mellitus without complications: Secondary | ICD-10-CM | POA: Diagnosis not present

## 2014-05-22 DIAGNOSIS — I5032 Chronic diastolic (congestive) heart failure: Secondary | ICD-10-CM | POA: Diagnosis not present

## 2014-05-22 DIAGNOSIS — J9610 Chronic respiratory failure, unspecified whether with hypoxia or hypercapnia: Secondary | ICD-10-CM | POA: Diagnosis not present

## 2014-05-22 DIAGNOSIS — I2510 Atherosclerotic heart disease of native coronary artery without angina pectoris: Secondary | ICD-10-CM | POA: Diagnosis not present

## 2014-05-22 DIAGNOSIS — R2689 Other abnormalities of gait and mobility: Secondary | ICD-10-CM | POA: Diagnosis not present

## 2014-05-22 DIAGNOSIS — S72001D Fracture of unspecified part of neck of right femur, subsequent encounter for closed fracture with routine healing: Secondary | ICD-10-CM | POA: Diagnosis not present

## 2014-05-28 DIAGNOSIS — I5032 Chronic diastolic (congestive) heart failure: Secondary | ICD-10-CM | POA: Diagnosis not present

## 2014-05-28 DIAGNOSIS — S72001D Fracture of unspecified part of neck of right femur, subsequent encounter for closed fracture with routine healing: Secondary | ICD-10-CM | POA: Diagnosis not present

## 2014-05-28 DIAGNOSIS — I2510 Atherosclerotic heart disease of native coronary artery without angina pectoris: Secondary | ICD-10-CM | POA: Diagnosis not present

## 2014-05-28 DIAGNOSIS — E119 Type 2 diabetes mellitus without complications: Secondary | ICD-10-CM | POA: Diagnosis not present

## 2014-05-28 DIAGNOSIS — J9610 Chronic respiratory failure, unspecified whether with hypoxia or hypercapnia: Secondary | ICD-10-CM | POA: Diagnosis not present

## 2014-05-28 DIAGNOSIS — R2689 Other abnormalities of gait and mobility: Secondary | ICD-10-CM | POA: Diagnosis not present

## 2014-05-29 DIAGNOSIS — S72001D Fracture of unspecified part of neck of right femur, subsequent encounter for closed fracture with routine healing: Secondary | ICD-10-CM | POA: Diagnosis not present

## 2014-05-29 DIAGNOSIS — E119 Type 2 diabetes mellitus without complications: Secondary | ICD-10-CM | POA: Diagnosis not present

## 2014-05-29 DIAGNOSIS — I5032 Chronic diastolic (congestive) heart failure: Secondary | ICD-10-CM | POA: Diagnosis not present

## 2014-05-29 DIAGNOSIS — I2510 Atherosclerotic heart disease of native coronary artery without angina pectoris: Secondary | ICD-10-CM | POA: Diagnosis not present

## 2014-05-29 DIAGNOSIS — J9610 Chronic respiratory failure, unspecified whether with hypoxia or hypercapnia: Secondary | ICD-10-CM | POA: Diagnosis not present

## 2014-05-29 DIAGNOSIS — R2689 Other abnormalities of gait and mobility: Secondary | ICD-10-CM | POA: Diagnosis not present

## 2014-06-03 DIAGNOSIS — I5032 Chronic diastolic (congestive) heart failure: Secondary | ICD-10-CM | POA: Diagnosis not present

## 2014-06-03 DIAGNOSIS — I2510 Atherosclerotic heart disease of native coronary artery without angina pectoris: Secondary | ICD-10-CM | POA: Diagnosis not present

## 2014-06-03 DIAGNOSIS — E119 Type 2 diabetes mellitus without complications: Secondary | ICD-10-CM | POA: Diagnosis not present

## 2014-06-03 DIAGNOSIS — R2689 Other abnormalities of gait and mobility: Secondary | ICD-10-CM | POA: Diagnosis not present

## 2014-06-03 DIAGNOSIS — S72001D Fracture of unspecified part of neck of right femur, subsequent encounter for closed fracture with routine healing: Secondary | ICD-10-CM | POA: Diagnosis not present

## 2014-06-03 DIAGNOSIS — J9610 Chronic respiratory failure, unspecified whether with hypoxia or hypercapnia: Secondary | ICD-10-CM | POA: Diagnosis not present

## 2014-06-04 ENCOUNTER — Telehealth: Payer: Self-pay | Admitting: *Deleted

## 2014-06-04 ENCOUNTER — Ambulatory Visit (INDEPENDENT_AMBULATORY_CARE_PROVIDER_SITE_OTHER): Payer: Medicare Other | Admitting: Family Medicine

## 2014-06-04 ENCOUNTER — Encounter: Payer: Self-pay | Admitting: Family Medicine

## 2014-06-04 ENCOUNTER — Encounter: Payer: Medicare Other | Admitting: Family Medicine

## 2014-06-04 VITALS — BP 116/64 | HR 72 | Temp 98.3°F | Ht 63.5 in | Wt 125.8 lb

## 2014-06-04 DIAGNOSIS — I5032 Chronic diastolic (congestive) heart failure: Secondary | ICD-10-CM

## 2014-06-04 DIAGNOSIS — E785 Hyperlipidemia, unspecified: Secondary | ICD-10-CM | POA: Diagnosis not present

## 2014-06-04 DIAGNOSIS — F331 Major depressive disorder, recurrent, moderate: Secondary | ICD-10-CM

## 2014-06-04 DIAGNOSIS — M898X9 Other specified disorders of bone, unspecified site: Secondary | ICD-10-CM

## 2014-06-04 DIAGNOSIS — I10 Essential (primary) hypertension: Secondary | ICD-10-CM

## 2014-06-04 DIAGNOSIS — Z7189 Other specified counseling: Secondary | ICD-10-CM

## 2014-06-04 DIAGNOSIS — N183 Chronic kidney disease, stage 3 unspecified: Secondary | ICD-10-CM

## 2014-06-04 DIAGNOSIS — M899 Disorder of bone, unspecified: Secondary | ICD-10-CM

## 2014-06-04 DIAGNOSIS — R110 Nausea: Secondary | ICD-10-CM

## 2014-06-04 DIAGNOSIS — I25810 Atherosclerosis of coronary artery bypass graft(s) without angina pectoris: Secondary | ICD-10-CM

## 2014-06-04 DIAGNOSIS — Z0000 Encounter for general adult medical examination without abnormal findings: Secondary | ICD-10-CM

## 2014-06-04 DIAGNOSIS — I25119 Atherosclerotic heart disease of native coronary artery with unspecified angina pectoris: Secondary | ICD-10-CM | POA: Diagnosis not present

## 2014-06-04 DIAGNOSIS — Z66 Do not resuscitate: Secondary | ICD-10-CM

## 2014-06-04 DIAGNOSIS — E1121 Type 2 diabetes mellitus with diabetic nephropathy: Secondary | ICD-10-CM | POA: Diagnosis not present

## 2014-06-04 DIAGNOSIS — L299 Pruritus, unspecified: Secondary | ICD-10-CM

## 2014-06-04 LAB — RENAL FUNCTION PANEL
Albumin: 3.9 g/dL (ref 3.5–5.2)
BUN: 8 mg/dL (ref 6–23)
CHLORIDE: 100 meq/L (ref 96–112)
CO2: 36 mEq/L — ABNORMAL HIGH (ref 19–32)
Calcium: 10.3 mg/dL (ref 8.4–10.5)
Creatinine, Ser: 0.66 mg/dL (ref 0.40–1.20)
GFR: 90.68 mL/min (ref >60.00)
GLUCOSE: 112 mg/dL — AB (ref 70–99)
PHOSPHORUS: 4.1 mg/dL (ref 2.3–4.6)
POTASSIUM: 4 meq/L (ref 3.5–5.1)
Sodium: 141 mEq/L (ref 135–145)

## 2014-06-04 LAB — LIPID PANEL
CHOL/HDL RATIO: 3
Cholesterol: 163 mg/dL (ref 0–200)
HDL: 48.5 mg/dL (ref >39.00)
LDL Cholesterol: 75 mg/dL (ref 0–99)
NONHDL: 114.5
Triglycerides: 197 mg/dL — ABNORMAL HIGH (ref 0.0–149.0)
VLDL: 39.4 mg/dL (ref 0.0–40.0)

## 2014-06-04 LAB — HEMOGLOBIN A1C: Hgb A1c MFr Bld: 6.2 % (ref 4.6–6.5)

## 2014-06-04 MED ORDER — FLUCONAZOLE 150 MG PO TABS: 150.0000 mg | ORAL_TABLET | Freq: Once | ORAL | Status: DC

## 2014-06-04 MED ORDER — DIPHENHYDRAMINE HCL 25 MG PO CAPS: 25.0000 mg | ORAL_CAPSULE | Freq: Three times a day (TID) | ORAL | Status: DC | PRN

## 2014-06-04 MED ORDER — DIPHENHYDRAMINE HCL 25 MG PO CAPS: 25.0000 mg | ORAL_CAPSULE | Freq: Four times a day (QID) | ORAL | Status: DC | PRN

## 2014-06-04 NOTE — Telephone Encounter (Signed)
recommend TID PRN. Sent new sig to pharmacy

## 2014-06-04 NOTE — Progress Notes (Signed)
Pre visit review using our clinic review tool, if applicable. No additional management support is needed unless otherwise documented below in the visit note. 

## 2014-06-04 NOTE — Assessment & Plan Note (Signed)
Now off fibrate and zetia, only on lipitor for last several months - check FLP today.

## 2014-06-04 NOTE — Assessment & Plan Note (Signed)
So far labwork unrevealing

## 2014-06-04 NOTE — Assessment & Plan Note (Signed)
Without rash. rec continued moisturiizing Trial benadryl prn.

## 2014-06-04 NOTE — Assessment & Plan Note (Signed)
Asymptomatic. Now off ranexa.

## 2014-06-04 NOTE — Assessment & Plan Note (Addendum)
Few crackles on exam without pedal edema - will continue extra torsemide MWF. Check Cr and K today.

## 2014-06-04 NOTE — Assessment & Plan Note (Signed)
Advanced directives - has at home. Daughter [name] knows pt wouldn't want extraordinary measures. Thinks son would be HCPOA - completed this 02/2014. Discussed DNR, pt desires this. DNR form filled out and provided to patient.

## 2014-06-04 NOTE — Assessment & Plan Note (Signed)
We have been peeling back on medications - currently only on low dose metformin and januvia. Check A1c and microalbumin today - pt unable to give urine today, will complete at [facility].

## 2014-06-04 NOTE — Patient Instructions (Addendum)
Blood work today - cholesterol, A1c, microalbumin. Diflucan sent to pharmacy for yeast infection Try benadryl for itching and update me with effect. Return as needed or in 4 months for follow up visit.

## 2014-06-04 NOTE — Progress Notes (Signed)
BP 116/64 mmHg  Pulse 72  Temp(Src) 98.3 F (36.8 C) (Oral)  Ht 5' 3.5" (1.613 m)  Wt 125 lb 12 oz (57.04 kg)  BMI 21.92 kg/m2   CC: medicare wellness visit  Subjective:    Patient ID: Christine Blanchard, female    DOB: 28-Nov-1928, 81 y.o.   MRN: [id number]  HPI: Christine Blanchard is a 82 y.o. female presenting on 06/04/2014 for Annual Exam   Christine Blanchard is a pleasant 81 yo with h/o HTN, DM, h/o CVA after cath, severe CAD s/p multiple interventions now planned medical management, diastolic CHF and chronic hypoxemic respiratory failure with both obstructive and restrictive components and respiratory muscle weakness who is oxygen dependent. She presents today for medicare wellness visit  She did recently have hip fracture after fall at home s/p hip fixation at [facility].   Recent Ecoli UTI treated with renally dosed cipro. She has had yeast infection after cipro.  Significant pruritis ongoing over last 3 months.  Vision problems - known macular degeneration, has had corneal transplants. Followed by [facility]. Denies problem with vision but does have trouble reading small print. Overall stable. Hearing - failed hearing screen on R>L.declines further evaluation. Does not want hearing aides  High fall risk - walks slowly and uses walker. Has had hip fracture this past year.  Denies depression/anhedonia. Difficulty coping with medical problems. Stable on celexa - doesn't feel would be able to decrease celexa. PHQ9 = 6  Preventative: No colonsocopy, not recommended (h/o several diverticulitis). Mother did die fom colon cancer. No current problems with diverticulitis.  Pap - last had 3 years ago, Dr. Quincy Simmonds (OBGYN). Always monogamous relationship until husband died. Never had abnormal. Aged out. Mammo - 2011 normal per patient. No family or personal history of breast cancer. Has had breast biopsies in past but all benign. Doesn't want to continue screening.  Flu 11/2013 Pneumovax 2009, prevnar today Td  2014 Shingles - 2011 per patient Has had Hep B per patient Thinks had bone density scan done around 2005 and told normal, declines repeat. Advanced directives - has at home. Daughter [name] knows pt wouldn't want extraordinary measures. Thinks son would be HCPOA - completed this 02/2014. Discussed DNR, pt desires this. DNR form filled out and provided to patient.  Lives alone at [facility] [address]. No pets Has lifeline. Daughter nearby [facility]) is CHF RN at [employer] nearby [place])  Relevant past medical, surgical, family and social history reviewed and updated as indicated. Interim medical history since our last visit reviewed. Allergies and medications reviewed and updated. Current Outpatient Prescriptions on File Prior to Visit  Medication Sig  . aspirin 81 MG tablet Take 81 mg by mouth daily.   Marland Kitchen atorvastatin (LIPITOR) 20 MG tablet TAKE 1 TABLET NIGHTLY  . Casanthranol-Docusate Sodium 30-100 MG CAPS as needed.    . cetirizine (ZYRTEC ALLERGY) 10 MG tablet Take 1 tablet (10 mg total) by mouth daily.  . citalopram (CELEXA) 10 MG tablet TAKE 1 TABLET DAILY  . clopidogrel (PLAVIX) 75 MG tablet TAKE 1 TABLET DAILY  . dimenhyDRINATE (DRAMAMINE) 50 MG tablet Take 50 mg by mouth at bedtime as needed.  . docusate sodium (COLACE) 100 MG capsule Take 1 capsule (100 mg total) by mouth daily.  . fluticasone (FLONASE) 50 MCG/ACT nasal spray Place 2 sprays into both nostrils daily.  Marland Kitchen glucose blood (FREESTYLE LITE) test strip Use to check sugar three times daily Dx: 250.40  . glucose monitoring kit (  FREESTYLE) monitoring kit 1 each by Does not apply route as needed for other.  . Insulin Glargine (LANTUS) 100 UNIT/ML Solostar Pen Inject 10 Units into the skin daily.  . Insulin Pen Needle 30G X 5 MM MISC Use as directed to inject insulin  . Lancets (FREESTYLE) lancets Use to check sugar three times daily Dx: 250.40  . LORazepam (ATIVAN) 1 MG tablet Take 1 tablet (1 mg total) by mouth at  bedtime. insomnia  . metFORMIN (GLUCOPHAGE-XR) 500 MG 24 hr tablet Take 1 tablet (500 mg total) by mouth at bedtime.  . metoprolol tartrate (LOPRESSOR) 25 MG tablet TAKE 1 TABLET TWO TIMES DAILY  . Multiple Vitamin (MULTIVITAMIN) capsule Take 1 capsule by mouth daily.    . nitroGLYCERIN (NITROSTAT) 0.4 MG SL tablet DISSOLVE 1 TABLET UNDER THE TONGUE AS NEEDED  . ondansetron (ZOFRAN) 4 MG tablet Take 1 tablet (4 mg total) by mouth every 8 (eight) hours as needed for nausea or vomiting.  . phenazopyridine (PYRIDIUM) 100 MG tablet Take 1 tablet (100 mg total) by mouth 2 (two) times daily as needed for pain.  . potassium chloride SA (K-DUR,KLOR-CON) 20 MEQ tablet take 11mq once daily  . prednisoLONE sodium phosphate (INFLAMASE FORTE) 1 % ophthalmic solution Place 1 drop into both eyes daily.   . RABEprazole (ACIPHEX) 20 MG tablet TAKE 1 TABLET DAILY  . senna-docusate (SENOKOT-S) 8.6-50 MG per tablet Take 1 tablet by mouth as needed for mild constipation.  . sitaGLIPtin (JANUVIA) 50 MG tablet Take 1 tablet (50 mg total) by mouth daily.  . sodium fluoride (FLUORISHIELD) 1.1 % GEL dental gel Place 1 application onto teeth 3 (three) times daily after meals.  . torsemide (DEMADEX) 20 MG tablet Take 2 tablets (40 mg total) by mouth daily.  . traMADol (ULTRAM) 50 MG tablet Take 1 tablet (50 mg total) by mouth daily as needed.  . traZODone (DESYREL) 50 MG tablet Take 0.5 tablets (25 mg total) by mouth at bedtime as needed for sleep.  . Vitamin D, Ergocalciferol, (DRISDOL) 50000 UNITS CAPS capsule TAKE 1 CAPSULE ONCE EVERY WEEK   No current facility-administered medications on file prior to visit.    Review of Systems Per HPI unless specifically indicated above     Objective:    BP 116/64 mmHg  Pulse 72  Temp(Src) 98.3 F (36.8 C) (Oral)  Ht 5' 3.5" (1.613 m)  Wt 125 lb 12 oz (57.04 kg)  BMI 21.92 kg/m2  Wt Readings from Last 3 Encounters:  06/04/14 125 lb 12 oz (57.04 kg)  05/16/14 123 lb  1.9 oz (55.847 kg)  05/07/14 126 lb 8 oz (57.38 kg)    Physical Exam  Constitutional: She is oriented to person, place, and time. She appears well-developed and well-nourished. No distress.  HENT:  Head: Normocephalic and atraumatic.  Right Ear: Hearing, tympanic membrane, external ear and ear canal normal.  Left Ear: Hearing, tympanic membrane, external ear and ear canal normal.  Nose: Nose normal.  Mouth/Throat: Uvula is midline, oropharynx is clear and moist and mucous membranes are normal. No oropharyngeal exudate, posterior oropharyngeal edema or posterior oropharyngeal erythema.  Eyes: Conjunctivae and EOM are normal. Pupils are equal, round, and reactive to light. No scleral icterus.  Neck: Normal range of motion. Neck supple.  Cardiovascular: Normal rate, regular rhythm, normal heart sounds and intact distal pulses.   No murmur heard. Pulses:      Radial pulses are 2+ on the right side, and 2+ on the left side.  Pulmonary/Chest: Effort normal and breath sounds normal. No respiratory distress. She has no wheezes. She has no rales.  Abdominal: Soft. Bowel sounds are normal. She exhibits no distension and no mass. There is no tenderness. There is no rebound and no guarding.  Musculoskeletal: Normal range of motion. She exhibits no edema.  Lymphadenopathy:    She has no cervical adenopathy.  Neurological: She is alert and oriented to person, place, and time.  CN grossly intact, station and gait intact Recall 2/3 , 3/3 with cue Calculation 5/5 serial 3s  Skin: Skin is warm and dry. No rash noted.  Psychiatric: She has a normal mood and affect. Her behavior is normal. Judgment and thought content normal.  Nursing note and vitals reviewed.  Results for orders placed or performed in visit on 06/04/14  Lipid panel  Result Value Ref Range   Cholesterol 163 0 - 200 mg/dL   Triglycerides 197.0 (H) 0.0 - 149.0 mg/dL   HDL 48.50 >39.00 mg/dL   VLDL 39.4 0.0 - 40.0 mg/dL   LDL  Cholesterol 75 0 - 99 mg/dL   Total CHOL/HDL Ratio 3    NonHDL 114.50   Hemoglobin A1c  Result Value Ref Range   Hgb A1c MFr Bld 6.2 4.6 - 6.5 %  Renal function panel  Result Value Ref Range   Sodium 141 135 - 145 mEq/L   Potassium 4.0 3.5 - 5.1 mEq/L   Chloride 100 96 - 112 mEq/L   CO2 36 (H) 19 - 32 mEq/L   Calcium 10.3 8.4 - 10.5 mg/dL   Albumin 3.9 3.5 - 5.2 g/dL   BUN 8 6 - 23 mg/dL   Creatinine, Ser 0.66 0.40 - 1.20 mg/dL   Glucose, Bld 112 (H) 70 - 99 mg/dL   Phosphorus 4.1 2.3 - 4.6 mg/dL   GFR 90.68 >60.00 mL/min      Assessment & Plan:  For yeast infection after recent abx - diflucan sent to pharmacy.  Problem List Items Addressed This Visit    Well controlled type 2 diabetes mellitus with nephropathy    We have been peeling back on medications - currently only on low dose metformin and januvia. Check A1c and microalbumin today - pt unable to give urine today, will complete at [facility].      Relevant Orders   Hemoglobin A1c (Completed)   Pruritic condition    Without rash. rec continued moisturiizing Trial benadryl prn.      Nausea    Persistent trouble, labwork unrevealing.       Medicare annual wellness visit, subsequent - Primary    I have personally reviewed the Medicare Annual Wellness questionnaire and have noted 1. The patient's medical and social history 2. Their use of alcohol, tobacco or illicit drugs 3. Their current medications and supplements 4. The patient's functional ability including ADL's, fall risks, home safety risks and hearing or visual impairment. 5. Diet and physical activity 6. Evidence for depression or mood disorders The patients weight, height, BMI have been recorded in the chart.  Hearing and vision has been addressed. I have made referrals, counseling and provided education to the patient based review of the above and I have provided the pt with a written personalized care plan for preventive services. Provider list updated  - see scanned questionairre. Reviewed preventative protocols and updated unless pt declined.       MDD (major depressive disorder), recurrent episode, moderate    Feels she's doing well on current celexa dose, but  does not wish to decrease.      Hyperlipidemia    Now off fibrate and zetia, only on lipitor for last several months - check FLP today.      Relevant Orders   Lipid panel (Completed)   Essential hypertension    Chronic, stable. Continue regimen.      DNR (do not resuscitate)    Discussed with patient. DNR form filled out.      Coronary artery disease    Asymptomatic. Now off ranexa.      CKD (chronic kidney disease) stage 3, GFR 30-59 ml/min    recheck Cr today.      Relevant Orders   Renal function panel (Completed)   Chronic diastolic CHF (congestive heart failure)    Few crackles on exam without pedal edema - will continue extra torsemide MWF. Check Cr and K today.      Bone pain    So far labwork unrevealing      Advanced care planning/counseling discussion    Advanced directives - has at home. Daughter [name] knows pt wouldn't want extraordinary measures. Thinks son would be HCPOA - completed this 02/2014. Discussed DNR, pt desires this. DNR form filled out and provided to patient.          Follow up plan: Return in about 4 months (around 10/05/2014), or as needed, for follow up visit.

## 2014-06-04 NOTE — Assessment & Plan Note (Signed)
recheck Cr today.

## 2014-06-04 NOTE — Assessment & Plan Note (Signed)

## 2014-06-04 NOTE — Assessment & Plan Note (Addendum)
Feels she's doing well on current celexa dose, but does not wish to decrease.

## 2014-06-04 NOTE — Assessment & Plan Note (Signed)
Chronic, stable. Continue regimen. 

## 2014-06-04 NOTE — Telephone Encounter (Signed)
[facility] calling stating that they received e-script for benadryl 1 tablet every 6 hours prn and then Christine Blanchard received the hand written physician communication and benadryl was written for 1 tablet every 8 hours prn. Please advise to [facility] the correct instructions.

## 2014-06-04 NOTE — Assessment & Plan Note (Signed)
Persistent trouble, labwork unrevealing.

## 2014-06-05 DIAGNOSIS — E1121 Type 2 diabetes mellitus with diabetic nephropathy: Secondary | ICD-10-CM | POA: Diagnosis not present

## 2014-06-05 NOTE — Assessment & Plan Note (Signed)
Discussed with patient. DNR form filled out.

## 2014-06-06 ENCOUNTER — Encounter: Payer: Self-pay | Admitting: Family Medicine

## 2014-06-07 DIAGNOSIS — I5032 Chronic diastolic (congestive) heart failure: Secondary | ICD-10-CM | POA: Diagnosis not present

## 2014-06-07 DIAGNOSIS — R2689 Other abnormalities of gait and mobility: Secondary | ICD-10-CM | POA: Diagnosis not present

## 2014-06-07 DIAGNOSIS — E119 Type 2 diabetes mellitus without complications: Secondary | ICD-10-CM | POA: Diagnosis not present

## 2014-06-07 DIAGNOSIS — I2510 Atherosclerotic heart disease of native coronary artery without angina pectoris: Secondary | ICD-10-CM | POA: Diagnosis not present

## 2014-06-07 DIAGNOSIS — J9610 Chronic respiratory failure, unspecified whether with hypoxia or hypercapnia: Secondary | ICD-10-CM | POA: Diagnosis not present

## 2014-06-07 DIAGNOSIS — S72001D Fracture of unspecified part of neck of right femur, subsequent encounter for closed fracture with routine healing: Secondary | ICD-10-CM | POA: Diagnosis not present

## 2014-06-14 ENCOUNTER — Other Ambulatory Visit: Payer: Self-pay | Admitting: Family Medicine

## 2014-06-14 MED ORDER — TRAMADOL HCL 50 MG PO TABS: 50.0000 mg | ORAL_TABLET | Freq: Every day | ORAL | Status: DC | PRN

## 2014-06-17 ENCOUNTER — Other Ambulatory Visit: Payer: Self-pay | Admitting: Family Medicine

## 2014-06-17 ENCOUNTER — Other Ambulatory Visit: Payer: Self-pay | Admitting: *Deleted

## 2014-06-17 NOTE — Telephone Encounter (Signed)
I printed script last Friday and placed in [place] box.

## 2014-06-17 NOTE — Telephone Encounter (Signed)
Becky at [facility] left a voicemail requesting a hard copy of a script for Tramadol. Jacqlyn Larsen stated that patient only has one left. Pharmacy[facility]

## 2014-06-17 NOTE — Telephone Encounter (Signed)
Rx sent to the pharmacy by e-script.//AB/CMA 

## 2014-06-18 NOTE — Telephone Encounter (Signed)
Spoke to Boswell at [facility] and she requested a copy of the script be faxed to her and she will fax it to [facility].  Script faxed to [facility] as requested.

## 2014-06-22 ENCOUNTER — Other Ambulatory Visit: Payer: Self-pay | Admitting: Cardiovascular Disease

## 2014-06-23 ENCOUNTER — Encounter: Payer: Self-pay | Admitting: Cardiovascular Disease

## 2014-07-01 ENCOUNTER — Other Ambulatory Visit: Payer: Self-pay | Admitting: Family Medicine

## 2014-07-02 ENCOUNTER — Telehealth: Payer: Self-pay

## 2014-07-02 NOTE — Telephone Encounter (Signed)
Nurse with [name] needs to discuss pt Christine Blanchard. Please call.

## 2014-07-02 NOTE — Telephone Encounter (Signed)
Spoke w/ Vaughan Basta. Clarified that pt is to d/c ranexa, fenofibrate, and zetia.

## 2014-07-09 ENCOUNTER — Other Ambulatory Visit: Payer: Self-pay

## 2014-07-09 ENCOUNTER — Telehealth: Payer: Self-pay

## 2014-07-09 DIAGNOSIS — E119 Type 2 diabetes mellitus without complications: Secondary | ICD-10-CM

## 2014-07-09 MED ORDER — TRAMADOL HCL 50 MG PO TABS: 50.0000 mg | ORAL_TABLET | Freq: Four times a day (QID) | ORAL | Status: DC | PRN

## 2014-07-09 NOTE — Telephone Encounter (Signed)
Rx faxed to [facility].

## 2014-07-09 NOTE — Telephone Encounter (Signed)
Pt left v/m; pt needs pedicure and [facility] does not cut toenails due to pt being diabetic and pt request referral to get toenails cut.Please advise.

## 2014-07-09 NOTE — Telephone Encounter (Signed)
Printed and in Kim's box. May phone in or send via fax whichever is needed.

## 2014-07-09 NOTE — Telephone Encounter (Signed)
Spoke with Christine Blanchard at [facility] requesting rx for tramadol and lorazepam to [facility] and [facility]. Spoke with Estill Bamberg at [facility] and refill for lorazepam is available already at [facility] and Estill Bamberg will send to [facility] today since pt is out of med. Becky notified and I will send refill request to Dr Darnell Level for refill tramadol; Christine Blanchard said pt still takes prn for hip pain.Please advise.

## 2014-07-10 ENCOUNTER — Other Ambulatory Visit: Payer: Self-pay | Admitting: Cardiovascular Disease

## 2014-07-10 NOTE — Telephone Encounter (Signed)
Referral placed.

## 2014-07-12 NOTE — Telephone Encounter (Signed)
LVM for pt to c/b re: referral

## 2014-07-15 ENCOUNTER — Telehealth: Payer: Self-pay | Admitting: *Deleted

## 2014-07-15 NOTE — Telephone Encounter (Signed)
[facility] calling wanting to clarify medication that was sent in. direction on Clopidogrel they have to instructions Just needs to know  Please call by Wednesday.

## 2014-07-16 NOTE — Telephone Encounter (Signed)
Spoke with pharmacist and they will Dispense Klor Con 20 meq. Klor Con Instructions have been clarified with pharmacist.

## 2014-07-21 ENCOUNTER — Other Ambulatory Visit: Payer: Self-pay | Admitting: Family Medicine

## 2014-07-22 ENCOUNTER — Telehealth: Payer: Self-pay | Admitting: *Deleted

## 2014-07-22 NOTE — Telephone Encounter (Signed)
Spoke w/ pt.  She reports that her Ranexa was d/c'd 2/2 nausea. She states that her anginal sx resumed shortly after this change. Denies SOB, n/v or sweating.  Reports that she takes nitro to control chest pain & tightness, but states that she has become dependent on it.  Advised pt to proceed to ED if her sx become emergent and she verbalizes understanding.  Please advise.  Thank you.

## 2014-07-22 NOTE — Telephone Encounter (Signed)
Ok to refill? Notes say she no longer takes med, but still on med list.

## 2014-07-22 NOTE — Telephone Encounter (Signed)
Pt calling stating she is having more angina Wednesday Thursday and Friday, took a lot of Nitro Last night did not need any.  Was told us that if this kept going to give a call.  Please advise.

## 2014-07-23 ENCOUNTER — Other Ambulatory Visit: Payer: Self-pay

## 2014-07-23 ENCOUNTER — Telehealth: Payer: Self-pay | Admitting: Cardiovascular Disease

## 2014-07-23 MED ORDER — ISOSORBIDE MONONITRATE ER 30 MG PO TB24: 30.0000 mg | ORAL_TABLET | Freq: Every day | ORAL | Status: DC

## 2014-07-23 NOTE — Telephone Encounter (Signed)
Pt c/o of Chest Pain: STAT if CP now or developed within 24 hours  1. Are you having CP right now? No only at night per nursing staff at [facility]   2. Are you experiencing any other symptoms (ex. SOB, nausea, vomiting, sweating)? No per nursing staff kristen  Patient has lost 5lbs this week  3. How long have you been experiencing CP? About a week   4. Is your CP continuous or coming and going? Comes and goes only at night   5. Have you taken Nitroglycerin? Patient told daughter yes but  She told staff no and it wasn't signed out on [facility]  Patient wants to start taking Renexa again  ?

## 2014-07-23 NOTE — Telephone Encounter (Signed)
Please see previous phone note.  

## 2014-07-23 NOTE — Telephone Encounter (Signed)
Please see previous phone note.   Rx sent for isosorbide today.

## 2014-07-23 NOTE — Telephone Encounter (Signed)
Order faxed.

## 2014-07-23 NOTE — Telephone Encounter (Signed)
Unclear for symptoms is pulmonary or cardiac related If nitroglycerin does help her symptoms, we could start isosorbide mononitrate 20 mg daily, take at nighttime

## 2014-07-23 NOTE — Telephone Encounter (Signed)
Please fax over new written order to [facility] at [facility]  [phone]    As Per Earlier [employer] with [facility]

## 2014-07-23 NOTE — Telephone Encounter (Signed)
Made Dr. Rockey Situ aware that I cannot access a 20 mg isosorbide in EPIC and he is agreeable to 30 mg daily. Spoke w/ pt.  Advised her of Dr. Donivan Scull recommendation.  She states that she is taking torsemide 40 mg on M, W, F and reports that she feels wiped out on these day. She asks if she can cut back to 20 mg on those days to see if this makes her feel better. Advised that Dr. Rockey Situ is ok w/ this. She asks me to speak w/ her med tech and give these instructions, but tech cannot take verbal orders.  She will call back w/ fax # to send order to.

## 2014-07-23 NOTE — Telephone Encounter (Signed)
Christine Blanchard at 07/23/2014 2:13 PM     Status: Signed       Expand All Collapse All   Please fax over new written order to [place] at [facility]  [phone]   As Per Earlier [employer] with [facility]

## 2014-07-30 ENCOUNTER — Ambulatory Visit (INDEPENDENT_AMBULATORY_CARE_PROVIDER_SITE_OTHER): Payer: Medicare Other | Admitting: Podiatry

## 2014-07-30 VITALS — BP 117/69 | HR 67 | Resp 16

## 2014-07-30 DIAGNOSIS — I25810 Atherosclerosis of coronary artery bypass graft(s) without angina pectoris: Secondary | ICD-10-CM

## 2014-07-30 DIAGNOSIS — B351 Tinea unguium: Secondary | ICD-10-CM

## 2014-07-30 DIAGNOSIS — M79676 Pain in unspecified toe(s): Secondary | ICD-10-CM | POA: Diagnosis not present

## 2014-07-30 NOTE — Patient Instructions (Signed)
Monitor redness to the right 3rd toe. If it worsens or is not cleared in the next 1-2 weeks call the office for follow up appointment.  Monitor for any signs/symptoms of infection. Call the office immediately if any occur or go directly to the emergency room. Call with any questions/concerns.

## 2014-07-31 ENCOUNTER — Other Ambulatory Visit: Payer: Self-pay | Admitting: *Deleted

## 2014-07-31 NOTE — Telephone Encounter (Signed)
Ok to refill 

## 2014-07-31 NOTE — Progress Notes (Signed)
Subjective:     Patient ID: Christine Blanchard, female   DOB: 1928-12-27, 81 y.o.   MRN: [id number]  HPI 81 year old female presents the office today with complaints of painful, elongated, thick toenails which she is unable to trim herself. She denies any redness or drainage on the nail sites. She states that her nails rather shoes causing irritation. No other complaints this time.  Review of Systems  All other systems reviewed and are negative.      Objective:   Physical Exam AAO x3, NAD DP/PT pulses palpable bilaterally, CRT less than 3 seconds Protective sensation intact with Simms Weinstein monofilament, vibratory sensation intact, Achilles tendon reflex intact Nails are hypertrophic, dystrophic, discolored, brittle, elongated 10. There is slight erythema around the right third digit toenail on the proximal nail border of there is no ascending cellulitis, drainage, fluctuance, crepitus, malodor. There is no other areas of edema, erythema to the remaining nails. There is tenderness to palpation overlying nails 1-5 bilaterally. No other areas of tenderness to bilateral lower extremities. MMT 5/5, ROM WNL.  No open lesions or pre-ulcerative lesions.  No overlying edema, erythema, increase in warmth to bilateral lower extremities.  No pain with calf compression, swelling, warmth, erythema bilaterally.      Assessment:     81 year old female with symptomatic onychomycosis    Plan:     -Treatment options discussed including all alternatives, risks, and complications -Nail sharply debrided 10 without complication/bleeding. -Discussed importance of daily foot inspection. Call the office immediately if any changes. -Continue to monitor the right third digit toenail. Appears to be some inflammation from around the toenail from a been thick. The area was debrided. Continue to monitor the area. The erythema worsens to call the office immediately. -Otherwise, follow-up in 3 months or sooner if  any problems are to arise. In the meantime I encouraged her to call the office with any question, concerns, change in symptoms.  Celesta Gentile, DPM

## 2014-08-01 ENCOUNTER — Telehealth: Payer: Self-pay

## 2014-08-01 MED ORDER — TRAMADOL HCL 50 MG PO TABS: 50.0000 mg | ORAL_TABLET | Freq: Four times a day (QID) | ORAL | Status: DC | PRN

## 2014-08-01 NOTE — Telephone Encounter (Signed)
Rx faxed to [facility].

## 2014-08-01 NOTE — Telephone Encounter (Signed)
Printed and in kims'box  

## 2014-08-01 NOTE — Telephone Encounter (Signed)
Kristen at [facility] left v/m; in last 30 days pt has only taken the zofran 4mg  tab at 11 AM(before lunch) 6 times in 30 days. Pt takes the zofran 4 mg daily at 4 pm. Cyril Mourning request order d/c zofran bid and new order zofran 4 mg one tab daily before dinner (4 PM) faxed to [phone]. Cyril Mourning also faxed request to Dr Darnell Level (in Dr Synthia Innocent in box).

## 2014-08-01 NOTE — Telephone Encounter (Signed)
ok to make change. New order sent back

## 2014-08-01 NOTE — Telephone Encounter (Signed)
[facility] did not receive fax for tramadol.Medication phoned to [facility] pharmacy as instructed.

## 2014-08-02 ENCOUNTER — Other Ambulatory Visit: Payer: Self-pay | Admitting: Cardiovascular Disease

## 2014-08-06 ENCOUNTER — Telehealth: Payer: Self-pay | Admitting: *Deleted

## 2014-08-06 ENCOUNTER — Telehealth: Payer: Self-pay

## 2014-08-06 NOTE — Telephone Encounter (Signed)
Request to change zofran order in your IN box for completion.

## 2014-08-06 NOTE — Telephone Encounter (Signed)
Becky at [facility] left v/m requesting printed rx for lorazepam; spoke with Amber at [facility] and there is another 90 day refill available and she will get it refilled for pt. Becky at [facility] notified and voiced understanding.

## 2014-08-08 NOTE — Telephone Encounter (Signed)
Order faxed to [facility].

## 2014-08-08 NOTE — Telephone Encounter (Signed)
Filled and Kim's box.

## 2014-08-15 ENCOUNTER — Telehealth: Payer: Self-pay

## 2014-08-15 ENCOUNTER — Telehealth: Payer: Self-pay | Admitting: Cardiovascular Disease

## 2014-08-15 NOTE — Telephone Encounter (Signed)
See note

## 2014-08-15 NOTE — Telephone Encounter (Signed)
Kristen from [facility] request clarification of torsemide instructions; on med list Dr Rockey Situ has prescribed and Cyril Mourning will contact Dr Donivan Scull office at [phone]. If needed Cyril Mourning will cb to [facility].

## 2014-08-15 NOTE — Telephone Encounter (Signed)
Med Tech at [facility] wants to clarify  Torsemide dosage.  Please call.

## 2014-08-16 NOTE — Telephone Encounter (Signed)
Spoke w/ Kristen @ [address]   She would like to clarify that pt is taking torsemide 20 mg on M, W, F.  Advised her that per phone note on 07/23/14, this is correct, as pt's BP has been running low and she feels weak when she takes 40 mg. Advised her that pt requested this change to see if she felt better. Asked her to call if pt's BP goes up or develops edema or SOB. She is agreeable and will call back w/ any further questions or concerns.

## 2014-08-19 ENCOUNTER — Telehealth: Payer: Self-pay

## 2014-08-19 NOTE — Telephone Encounter (Signed)
Pt has some questions regarding her medications. States they are "all messed up" please call.

## 2014-08-19 NOTE — Telephone Encounter (Signed)
S/w pt who states med tech has already straightened out medications.

## 2014-08-24 ENCOUNTER — Encounter: Payer: Self-pay | Admitting: Cardiovascular Disease

## 2014-08-28 ENCOUNTER — Ambulatory Visit (INDEPENDENT_AMBULATORY_CARE_PROVIDER_SITE_OTHER): Payer: Medicare Other | Admitting: Cardiovascular Disease

## 2014-08-28 ENCOUNTER — Encounter: Payer: Self-pay | Admitting: Cardiovascular Disease

## 2014-08-28 VITALS — BP 124/62 | HR 72 | Ht 64.5 in | Wt 124.0 lb

## 2014-08-28 DIAGNOSIS — I25810 Atherosclerosis of coronary artery bypass graft(s) without angina pectoris: Secondary | ICD-10-CM | POA: Diagnosis not present

## 2014-08-28 DIAGNOSIS — R0602 Shortness of breath: Secondary | ICD-10-CM

## 2014-08-28 DIAGNOSIS — J9611 Chronic respiratory failure with hypoxia: Secondary | ICD-10-CM

## 2014-08-28 DIAGNOSIS — I5032 Chronic diastolic (congestive) heart failure: Secondary | ICD-10-CM | POA: Diagnosis not present

## 2014-08-28 DIAGNOSIS — I10 Essential (primary) hypertension: Secondary | ICD-10-CM | POA: Diagnosis not present

## 2014-08-28 DIAGNOSIS — R29898 Other symptoms and signs involving the musculoskeletal system: Secondary | ICD-10-CM

## 2014-08-28 DIAGNOSIS — E785 Hyperlipidemia, unspecified: Secondary | ICD-10-CM

## 2014-08-28 NOTE — Patient Instructions (Signed)
You are doing well. No medication changes were made.  We will schedule an echocardiogram for increasing SOB We will arrange an appt with pulmonary  Please call us if you have new issues that need to be addressed before your next appt.  Your physician wants you to follow-up in: 3 months.  You will receive a reminder letter in the mail two months in advance. If you don't receive a letter, please call our office to schedule the follow-up appointment.

## 2014-08-28 NOTE — Progress Notes (Signed)
Patient ID: Christine Blanchard, female    DOB: 1928-08-20, 81 y.o.   MRN: [id number]  HPI Comments: Ms. Righetti is a very pleasant 32 -year-old woman with history of coronary artery disease, bypass surgery in 2004, hyperlipidemia, PCI 6 months after her bypass, repeat PCI one year later (She does report having a stroke after her cardiac catheterization), diabetes, hypertension, spinal stenosis,  who currently lives in a nursing home in [place], who has chronic unsteady gait , history of falls,  with chronic shortness of breath on oxygen who presents for routine followup of her shortness of breath, chronic diastolic CHF. Previous Pulmonary workup has revealed restrictive lung disease.  She currently lives at [facility]. She has chronic hypoxemic respiratory failure with both obstructive and restrictive components and respiratory muscle weakness who is oxygen dependent. She presents today for worsening shortness of breath  In follow-up today, she reports not feeling well in general. She is vague about her symptoms. Sounds like family feels her shortness of breath is getting worse. She is on chronic oxygen, no recent follow-up with pulmonary There was some problem with her torsemide dosing. Finally back on track. Taking torsemide 40 mg every morning. Initially was told to take torsemide in the afternoon 3 days per week. For some reason her nursing home canceled her daily morning dosing and she started retaining fluid with worsening leg edema. Now this is close to her baseline again feels shortness of breath with general malaise. Down 2 pounds from her prior clinic visit, continued anorexia  Other past medical history  mechanical fall February 2016 with hip fracture. Initially sent to [facility], transferred to [facility] at the request of family where she underwent hip repair with several screws.  Transfer back to rehabilitation. She is not full weightbearing at this time. Partial weightbearing with a walker. Gait  continues to be unsteady.  History of chronic nausea, on Zofran  Anorexia has been going on since November 2015 with significant weight loss since her prior clinic visit.  Previously was on stool softeners, does not taking this on a regular basis.  No falls or syncope.  No regular exercise program.  Denies any cough. On oxygen, level at 2 L even with ambulation.  Previous cardiac CTA in the past for atypical chest pain that showed: Patent SVG to D1 with poor distal runoff,  Occluded LIMA to LAD.  Distal LAD never visualized,  RCA and circumflex patient with multiple 50% or less calcfic Lesions,  Poor distal runoff from stented IM/LAD and small D1 supplied by SVG  Previous  Echocardiogram was essentially normal with normal ejection fraction greater than 27%, diastolic dysfunction, normal right ventricular systolic pressures  Stress test showed no ischemia with ejection fraction greater than 55%   12/2011 In the hospital, total cholesterol 146, LDL 58, HDL 65  Allergies  Allergen Reactions  . Actos [Pioglitazone Hydrochloride] Shortness Of Breath    CHF  . Pioglitazone Shortness Of Breath    CHF REACTION: heart failure  . Atenolol Other (See Comments)    lethargy  . Codeine     REACTION: vomiting  . Eszopiclone Other (See Comments)    Nightmares  . Eszopiclone   . Exenatide Nausea Only  . Morphine     REACTION: nausea  . Sulfa Antibiotics   . Sulfacetamide Sodium   . Vicodin [Hydrocodone-Acetaminophen] Nausea And Vomiting  . Exenatide Nausea Only  . Hydrocodone-Acetaminophen Nausea And Vomiting    Outpatient Encounter Prescriptions as of 08/28/2014  Medication  Sig  . acetaminophen (TYLENOL) 500 MG tablet Take 1,000 mg by mouth 3 (three) times daily as needed.  Marland Kitchen aspirin 81 MG tablet Take 81 mg by mouth daily.   Marland Kitchen atorvastatin (LIPITOR) 20 MG tablet TAKE 1 TABLET NIGHTLY  . Casanthranol-Docusate Sodium 30-100 MG CAPS as needed.    . cetirizine (ZYRTEC ALLERGY) 10 MG tablet  Take 1 tablet (10 mg total) by mouth daily.  . citalopram (CELEXA) 10 MG tablet TAKE 1 TABLET DAILY  . clopidogrel (PLAVIX) 75 MG tablet TAKE 1 TABLET DAILY  . dimenhyDRINATE (DRAMAMINE) 50 MG tablet Take 50 mg by mouth at bedtime as needed.  . diphenhydrAMINE (BENADRYL) 25 mg capsule Take 1 capsule (25 mg total) by mouth 3 (three) times daily as needed for itching.  . docusate sodium (COLACE) 100 MG capsule Take 1 capsule (100 mg total) by mouth daily.  . fluconazole (DIFLUCAN) 150 MG tablet Take 1 tablet (150 mg total) by mouth once.  . fluticasone (FLONASE) 50 MCG/ACT nasal spray Place 2 sprays into both nostrils daily.  Marland Kitchen glucose blood (FREESTYLE LITE) test strip Use to check sugar three times daily Dx: 250.40  . glucose monitoring kit (FREESTYLE) monitoring kit 1 each by Does not apply route as needed for other.  . Insulin Glargine (LANTUS SOLOSTAR) 100 UNIT/ML Solostar Pen Inject 10 Units into the skin daily at 10 pm.  . Insulin Pen Needle 30G X 5 MM MISC Use as directed to inject insulin  . isosorbide mononitrate (IMDUR) 30 MG 24 hr tablet Take 1 tablet (30 mg total) by mouth at bedtime.  Marland Kitchen KLOR-CON M20 20 MEQ tablet TAKE ONE-HALF (1/2) TABLET DAILY. TAKE 1 TABLET DAILY ONLY WHEN YOU TAKE LASIX  . Lancets (FREESTYLE) lancets Use to check sugar three times daily Dx: 250.40  . LORazepam (ATIVAN) 1 MG tablet Take 1 tablet (1 mg total) by mouth at bedtime. insomnia  . metFORMIN (GLUCOPHAGE-XR) 500 MG 24 hr tablet Take 1 tablet (500 mg total) by mouth at bedtime.  . metoprolol tartrate (LOPRESSOR) 25 MG tablet TAKE 1 TABLET TWICE A DAY  . Multiple Vitamin (MULTIVITAMIN) capsule Take 1 capsule by mouth daily.    . nitroGLYCERIN (NITROSTAT) 0.4 MG SL tablet DISSOLVE 1 TABLET UNDER THE TONGUE AS NEEDED  . ondansetron (ZOFRAN) 4 MG tablet Take 1 tablet (4 mg total) by mouth every 8 (eight) hours as needed for nausea or vomiting.  . prednisoLONE sodium phosphate (INFLAMASE FORTE) 1 % ophthalmic  solution Place 1 drop into both eyes daily.   . RABEprazole (ACIPHEX) 20 MG tablet TAKE 1 TABLET DAILY  . sitaGLIPtin (JANUVIA) 50 MG tablet Take 1 tablet (50 mg total) by mouth daily.  Marland Kitchen torsemide (DEMADEX) 20 MG tablet Take 40 mg by mouth as directed. Take 40 mg every am, take extra 20 mg at 2:00 pm on Mon, Wed, Fri  . traMADol (ULTRAM) 50 MG tablet Take 1 tablet (50 mg total) by mouth every 6 (six) hours as needed for moderate pain.  . traZODone (DESYREL) 50 MG tablet Take 0.5 tablets (25 mg total) by mouth at bedtime as needed for sleep.  . Vitamin D, Ergocalciferol, (DRISDOL) 50000 UNITS CAPS capsule TAKE 1 CAPSULE ONCE EVERY WEEK  . ZETIA 10 MG tablet TAKE 1 TABLET DAILY  . [DISCONTINUED] phenazopyridine (PYRIDIUM) 100 MG tablet Take 1 tablet (100 mg total) by mouth 2 (two) times daily as needed for pain. (Patient not taking: Reported on 08/28/2014)  . [DISCONTINUED] potassium chloride SA (K-DUR,KLOR-CON) 20  MEQ tablet take 29mq once daily  . [DISCONTINUED] senna-docusate (SENOKOT-S) 8.6-50 MG per tablet Take 1 tablet by mouth as needed for mild constipation.  . [DISCONTINUED] sodium fluoride (FLUORISHIELD) 1.1 % GEL dental gel Place 1 application onto teeth 3 (three) times daily after meals.   No facility-administered encounter medications on file as of 08/28/2014.    Past Medical History  Diagnosis Date  . Hypertension   . Diabetes mellitus 2003    previously saw Dr. AElyse Hsu . Hyperlipidemia   . History of chicken pox     has had shingles shot  . History of diverticulitis of colon 1991, 1992, 1994  . History of kidney stones 1952  . CVA (cerebral infarction)     after a stent, minimal R residual weakness, affects speech when tired, ?mild dysequilibrium  . Urinary incontinence     stress/urge, per D. Tannenbaum/[place]  . CAD (coronary artery disease)     severe, s/p 4 cardiac caths, minimally invasive CABG @[facility] , ICA stent 2004 then LAD stent 2005, MEDICAL MANAGEMENT ONLY, MAY  USE REPEATED SL NITRO  . Breathing difficulty 2011    on O2 since 10/2009  . Syncope 2005    s/p w/u including CT scan and 24 hour Holter  . GERD (gastroesophageal reflux disease) 2004  . Dry senile macular degeneration     s/p corneal transplants ([facility])  . Benign essential tremor     toprol XL and remeron  . Herpes zoster ophthalmicus 2009    history  . Hearing loss of both ears 08/2010    audiology eval 08/2010 - mod sensorineural heaing loss, rec trial digital hearing aids and re eval yearly to monitor  . Vitamin D deficiency     on 50k units weekly  . Diastolic CHF 23/2671   grade 1, normal EF, mildly dilated LA, normal PA pressures  . Lumbar spinal stenosis     with severe spondylosis, s/p laminotomy  . Allergic rhinitis   . Chronic kidney disease     stage 3 kidney disease.  . Leg weakness, bilateral 2014    s/p neuro eval - thought multifactorial (diabetic neuropathy, lumbar stenosis, and chronic disease deconditioning).  Conservative management recommended  . Gait instability   . Wedge compression fracture of T10 vertebra 2015    by CXR  . Fracture of femoral neck, right 03/2014    s/p surgery    Past Surgical History  Procedure Laterality Date  . Cholecystectomy  1985  . Cervical fusion  2002    anterior decompression and fusion C6/7  . Coronary artery bypass graft  2004  . Percutaneous coronary stent intervention (pci-s)  2005    stents x 2 (intermediate and LAD), 2nd complicated by stroke  . Corneal transplant      x3  . Appendectomy  1948  . Breast biopsy  2001    x2, both benign, last mammo 2011, last pap smear 2011  . Cardiac catheterization  2009    x5  . Cataract extraction      bilateral, corneal dystrophy  . Cardiovascular stress test  2010    nuclear, normal  . Dexa  2005    normal  . Lumbar spine surgery  2010    laminotomy (L2/3, 3/4)  . Hip fracture surgery Right 03/2014    closed reduction percutaneous pinning of R femoral neck  fracture ([facility], [facility])    Social History  reports that she has never smoked. She has never used smokeless  tobacco. She reports that she does not drink alcohol or use illicit drugs.  Family History family history includes Cancer in her brother and mother; Heart disease in her father.   Review of Systems  Constitutional: Negative.        General malaise  Respiratory: Positive for shortness of breath.   Cardiovascular: Negative.   Gastrointestinal: Negative.   Musculoskeletal: Positive for gait problem.  Neurological: Negative.   Hematological: Negative.   Psychiatric/Behavioral: Negative.   All other systems reviewed and are negative.   BP 124/62 mmHg  Pulse 72  Ht 5' 4.5" (1.638 m)  Wt 124 lb (56.246 kg)  BMI 20.96 kg/m2  Physical Exam  Constitutional: She is oriented to person, place, and time. She appears well-developed and well-nourished.  HENT:  Head: Normocephalic.  Nose: Nose normal.  Mouth/Throat: Oropharynx is clear and moist.  Eyes: Conjunctivae are normal. Pupils are equal, round, and reactive to light.  Neck: Normal range of motion. Neck supple. No JVD present.  Cardiovascular: Normal rate, regular rhythm, S1 normal, S2 normal and intact distal pulses.  Exam reveals no gallop and no friction rub.   Murmur heard.  Crescendo systolic murmur is present with a grade of 2/6  Trace pitting edema around the ankles, low shins  Pulmonary/Chest: Effort normal. No respiratory distress. She has decreased breath sounds. She has no wheezes. She has rales. She exhibits no tenderness.  Abdominal: Soft. Bowel sounds are normal. She exhibits no distension. There is no tenderness.  Musculoskeletal: Normal range of motion. She exhibits no edema or tenderness.  Lymphadenopathy:    She has no cervical adenopathy.  Neurological: She is alert and oriented to person, place, and time. Coordination normal.  Skin: Skin is warm and dry. No rash noted. No erythema.  Psychiatric: She has a  normal mood and affect. Her behavior is normal. Judgment and thought content normal.    Assessment and Plan  Nursing note and vitals reviewed.

## 2014-08-28 NOTE — Assessment & Plan Note (Signed)
She has significant leg weakness, in general very deconditioned. Limited in her ability to exercise secondary to hypoxia

## 2014-08-28 NOTE — Assessment & Plan Note (Signed)
Cholesterol is at goal on the current lipid regimen. No changes to the medications were made.  

## 2014-08-28 NOTE — Assessment & Plan Note (Addendum)
Currently with no plans for ischemia workup. If shortness breath gets worse or declining ejection fraction on echocardiogram, would order ischemia workup

## 2014-08-28 NOTE — Assessment & Plan Note (Signed)
We'll refer back to pulmonary. Echocardiogram has been ordered to evaluate right heart pressures, EF. She does report hypoxia when she eats

## 2014-08-28 NOTE — Assessment & Plan Note (Signed)
Blood pressure is well controlled on today's visit. No changes made to the medications. 

## 2014-08-28 NOTE — Assessment & Plan Note (Signed)
Echocardiogram has been ordered given general malaise, shortness of breath

## 2014-09-02 ENCOUNTER — Telehealth: Payer: Self-pay

## 2014-09-02 ENCOUNTER — Other Ambulatory Visit: Payer: Medicare Other

## 2014-09-02 NOTE — Telephone Encounter (Signed)
Melissa at [facility] notified as instructed by telephone and verbalized understanding.

## 2014-09-02 NOTE — Telephone Encounter (Signed)
Becky at [facility] left v/m requesting clarification of instructions for ondansetron; first instructions were zofran 1 mg take one tab once daily at 4 PM; then on 08/19/14 received another rx for zofran with instructions take one tab by mouth every 8 hours. Becky request cb.

## 2014-09-02 NOTE — Telephone Encounter (Signed)
recommend new instructions - 1 tablet at 4pm as needed.

## 2014-09-03 ENCOUNTER — Ambulatory Visit (INDEPENDENT_AMBULATORY_CARE_PROVIDER_SITE_OTHER): Payer: Medicare Other

## 2014-09-03 ENCOUNTER — Other Ambulatory Visit: Payer: Self-pay

## 2014-09-03 DIAGNOSIS — R0602 Shortness of breath: Secondary | ICD-10-CM

## 2014-09-03 DIAGNOSIS — I25810 Atherosclerosis of coronary artery bypass graft(s) without angina pectoris: Secondary | ICD-10-CM | POA: Diagnosis not present

## 2014-09-03 DIAGNOSIS — I5032 Chronic diastolic (congestive) heart failure: Secondary | ICD-10-CM

## 2014-09-04 ENCOUNTER — Telehealth: Payer: Self-pay

## 2014-09-04 NOTE — Telephone Encounter (Signed)
Please see result note 

## 2014-09-04 NOTE — Telephone Encounter (Signed)
Pt daughter called, would like echo results.

## 2014-09-05 ENCOUNTER — Ambulatory Visit
Admission: RE | Admit: 2014-09-05 | Discharge: 2014-09-05 | Disposition: A | Discharge status: Home / Self Care | Payer: Medicare Other | Source: Ambulatory Visit | Attending: Internal Medicine | Admitting: Internal Medicine

## 2014-09-05 ENCOUNTER — Ambulatory Visit (INDEPENDENT_AMBULATORY_CARE_PROVIDER_SITE_OTHER): Payer: Medicare Other | Admitting: Internal Medicine

## 2014-09-05 ENCOUNTER — Encounter: Payer: Self-pay | Admitting: Internal Medicine

## 2014-09-05 ENCOUNTER — Telehealth: Payer: Self-pay | Admitting: *Deleted

## 2014-09-05 VITALS — BP 118/60 | HR 68 | Temp 98.1°F | Ht 65.5 in | Wt 126.0 lb

## 2014-09-05 DIAGNOSIS — I5032 Chronic diastolic (congestive) heart failure: Secondary | ICD-10-CM | POA: Diagnosis not present

## 2014-09-05 DIAGNOSIS — I700 Atherosclerosis of aorta: Secondary | ICD-10-CM | POA: Diagnosis not present

## 2014-09-05 DIAGNOSIS — I25810 Atherosclerosis of coronary artery bypass graft(s) without angina pectoris: Secondary | ICD-10-CM

## 2014-09-05 DIAGNOSIS — Z951 Presence of aortocoronary bypass graft: Secondary | ICD-10-CM | POA: Diagnosis not present

## 2014-09-05 DIAGNOSIS — R0602 Shortness of breath: Secondary | ICD-10-CM | POA: Diagnosis not present

## 2014-09-05 DIAGNOSIS — J9611 Chronic respiratory failure with hypoxia: Secondary | ICD-10-CM

## 2014-09-05 NOTE — Assessment & Plan Note (Signed)
--  Chronic congestive heart failure and volume overload with CAD, she is following with cardiology.  --Continue current cardiac regimen and diuretic.

## 2014-09-05 NOTE — Assessment & Plan Note (Signed)
The patient is the Multifactorial causes of dyspnea including restrictive and obstructive lung disease, chronic angina, deconditioning and likely some degree of chronic aspiration. 06/2011 6MW 725 ft., O2 98% 2L [place] HR peack 90

## 2014-09-05 NOTE — Addendum Note (Signed)
Addended by: Devona Konig on: 09/05/2014 09:44 AM   Modules accepted: Orders

## 2014-09-05 NOTE — Patient Instructions (Addendum)
--  Full PFT.  --Continue current medications.  --Refer to pulmonary rehab.  --CXR 2 view.  --Follow up in 3 to 4 weeks.

## 2014-09-05 NOTE — Assessment & Plan Note (Addendum)
--  Continuous O2 at 2L/min. --Suspect that this is multifactorial due to combined pulmonary disease, cardiac disease, debility and deconditioning.  --Will check a sat walk to today to make sure that her oxygen level is appropriate.  --Will check a PFT to better quantify her lung disease.

## 2014-09-05 NOTE — Addendum Note (Signed)
Addended by: Devona Konig on: 09/05/2014 09:49 AM   Modules accepted: Medications

## 2014-09-05 NOTE — Progress Notes (Addendum)
* [facility]    Assessment and Plan: DYSPNEA The patient is the Multifactorial causes of dyspnea including restrictive and obstructive lung disease, chronic angina, deconditioning and likely some degree of chronic aspiration. 06/2011 6MW 725 ft., O2 98% 2L [place] HR peack 90  Chronic hypoxemic respiratory failure --Continuous O2 at 2L/min. --Suspect that this is multifactorial due to combined pulmonary disease, cardiac disease, debility and deconditioning.  --Will check a sat walk to today to make sure that her oxygen level is appropriate.  --Will check a PFT to better quantify her lung disease. Based on results of this will make recommendations regarding inhaler usage (is currently not on any).   Congestive heart failure --Chronic congestive heart failure and volume overload with CAD, she is following with cardiology.  --Continue current cardiac regimen and diuretic.    ADDENDUM: CXR showed possible 1.5 cm retrocardiac density, though not seen on lateral view. Will discuss possible CT scan with her at next visit with me in approximately 1 month.    Date: 09/05/2014  MRN# [id number] Christine Blanchard Jul 16, 1928   CC:  Chief Complaint  Patient presents with  . Follow-up    Former BQ pt here for increased sob/ chest discomfort. Pt was seen by cardiology and referred by to Pulmonary.    HPI:  Christine Blanchard is an 81 y/o female with coronary artery disease and chronic angina who has been followed by [facility] in [facility] (by Dr. Lamonte Sakai) for years and [facility] in [place] since 02/2011 for chronic dyspnea and hypoxemic respiratory failure. Objectively she has both restrictive and obstructive disease and low MIP and MEP. She also likely has some degree of chronic aspiration. She has severe coronary artery disease with chronic angina, debility, unstable gait, and lives in a nursing home.  She has been on oxygen at 2L, she ambulates with a walker, she lives in assisted  living, she walks to the dining room. She has been on oxygen for about 4 years. She had a hip fracture and has completed PT about 6 weeks ago, since that time she has not been very active. When she did PT she noted that she was winded. She can get dressed and take a shower without much difficulty but she will be tired. She bathes with assistance 3 days per week and uses a shower chair.  She can not control her bladder and uses depends, and she is on a diuretic.  She is on no inhalers at this time, she uses a steroid nasal spray.  She does not snore at night, she wakes in the am and feels that slept well on most days.      PMHX:   Past Medical History  Diagnosis Date  . Hypertension   . Diabetes mellitus 2003    previously saw Dr. Elyse Hsu  . Hyperlipidemia   . History of chicken pox     has had shingles shot  . History of diverticulitis of colon 1991, 1992, 1994  . History of kidney stones 1952  . CVA (cerebral infarction)     after a stent, minimal R residual weakness, affects speech when tired, ?mild dysequilibrium  . Urinary incontinence     stress/urge, per D. Tannenbaum/[place]  . CAD (coronary artery disease)     severe, s/p 4 cardiac caths, minimally invasive CABG _0 , ICA stent 2004 then LAD stent 2005, MEDICAL MANAGEMENT ONLY, MAY USE REPEATED SL NITRO  . Breathing difficulty 2011    on O2 since 10/2009  .  Syncope 2005    s/p w/u including CT scan and 24 hour Holter  . GERD (gastroesophageal reflux disease) 2004  . Dry senile macular degeneration     s/p corneal transplants ([facility])  . Benign essential tremor     toprol XL and remeron  . Herpes zoster ophthalmicus 2009    history  . Hearing loss of both ears 08/2010    audiology eval 08/2010 - mod sensorineural heaing loss, rec trial digital hearing aids and re eval yearly to monitor  . Vitamin D deficiency     on 50k units weekly  . Diastolic CHF 05/7423    grade 1, normal EF, mildly dilated LA, normal PA  pressures  . Lumbar spinal stenosis     with severe spondylosis, s/p laminotomy  . Allergic rhinitis   . Chronic kidney disease     stage 3 kidney disease.  . Leg weakness, bilateral 2014    s/p neuro eval - thought multifactorial (diabetic neuropathy, lumbar stenosis, and chronic disease deconditioning).  Conservative management recommended  . Gait instability   . Wedge compression fracture of T10 vertebra 2015    by CXR  . Fracture of femoral neck, right 03/2014    s/p surgery    Medication:   Current Outpatient Rx  Name  Route  Sig  Dispense  Refill  . acetaminophen (TYLENOL) 500 MG tablet   Oral   Take 1,000 mg by mouth 3 (three) times daily as needed.         Marland Kitchen aspirin 81 MG tablet   Oral   Take 81 mg by mouth daily.          Marland Kitchen atorvastatin (LIPITOR) 20 MG tablet      TAKE 1 TABLET NIGHTLY   90 tablet   2   . Casanthranol-Docusate Sodium 30-100 MG CAPS      as needed.           . cetirizine (ZYRTEC ALLERGY) 10 MG tablet   Oral   Take 1 tablet (10 mg total) by mouth daily.   90 tablet   1   . citalopram (CELEXA) 10 MG tablet      TAKE 1 TABLET DAILY   90 tablet   2   . clopidogrel (PLAVIX) 75 MG tablet      TAKE 1 TABLET DAILY   90 tablet   3   . dimenhyDRINATE (DRAMAMINE) 50 MG tablet   Oral   Take 50 mg by mouth at bedtime as needed.         . diphenhydrAMINE (BENADRYL) 25 mg capsule   Oral   Take 1 capsule (25 mg total) by mouth 3 (three) times daily as needed for itching.   30 capsule   0   . docusate sodium (COLACE) 100 MG capsule   Oral   Take 1 capsule (100 mg total) by mouth daily.   90 capsule   1   . fluconazole (DIFLUCAN) 150 MG tablet   Oral   Take 1 tablet (150 mg total) by mouth once.   1 tablet   0   . fluticasone (FLONASE) 50 MCG/ACT nasal spray   Each Nare   Place 2 sprays into both nostrils daily.   48 g   3   . glucose blood (FREESTYLE LITE) test strip      Use to check sugar three times daily Dx:  250.40   100 each   3   . glucose monitoring kit (FREESTYLE)  monitoring kit   Does not apply   1 each by Does not apply route as needed for other.   1 each   0   . Insulin Glargine (LANTUS SOLOSTAR) 100 UNIT/ML Solostar Pen   Subcutaneous   Inject 10 Units into the skin daily at 10 pm.   15 mL   2   . Insulin Pen Needle 30G X 5 MM MISC      Use as directed to inject insulin   100 each   4   . isosorbide mononitrate (IMDUR) 30 MG 24 hr tablet   Oral   Take 1 tablet (30 mg total) by mouth at bedtime.   90 tablet   4   . KLOR-CON M20 20 MEQ tablet      TAKE ONE-HALF (1/2) TABLET DAILY. TAKE 1 TABLET DAILY ONLY WHEN YOU TAKE LASIX   90 tablet   3   . Lancets (FREESTYLE) lancets      Use to check sugar three times daily Dx: 250.40   100 each   3   . LORazepam (ATIVAN) 1 MG tablet   Oral   Take 1 tablet (1 mg total) by mouth at bedtime. insomnia   90 tablet   1   . metFORMIN (GLUCOPHAGE-XR) 500 MG 24 hr tablet   Oral   Take 1 tablet (500 mg total) by mouth at bedtime.         . metoprolol tartrate (LOPRESSOR) 25 MG tablet      TAKE 1 TABLET TWICE A DAY   180 tablet   3   . Multiple Vitamin (MULTIVITAMIN) capsule   Oral   Take 1 capsule by mouth daily.           . nitroGLYCERIN (NITROSTAT) 0.4 MG SL tablet      DISSOLVE 1 TABLET UNDER THE TONGUE AS NEEDED   30 tablet   3   . ondansetron (ZOFRAN) 4 MG tablet   Oral   Take 1 tablet (4 mg total) by mouth daily as needed for nausea or vomiting. 4pm         . prednisoLONE sodium phosphate (INFLAMASE FORTE) 1 % ophthalmic solution   Both Eyes   Place 1 drop into both eyes daily.          . RABEprazole (ACIPHEX) 20 MG tablet      TAKE 1 TABLET DAILY   90 tablet   2   . sitaGLIPtin (JANUVIA) 50 MG tablet   Oral   Take 1 tablet (50 mg total) by mouth daily.   90 tablet   3   . torsemide (DEMADEX) 20 MG tablet   Oral   Take 40 mg by mouth as directed. Take 40 mg every am, take extra 20 mg  at 2:00 pm on Mon, Wed, Fri         . traMADol (ULTRAM) 50 MG tablet   Oral   Take 1 tablet (50 mg total) by mouth every 6 (six) hours as needed for moderate pain.   30 tablet   3   . traZODone (DESYREL) 50 MG tablet   Oral   Take 0.5 tablets (25 mg total) by mouth at bedtime as needed for sleep.         . Vitamin D, Ergocalciferol, (DRISDOL) 50000 UNITS CAPS capsule      TAKE 1 CAPSULE ONCE EVERY WEEK   13 capsule   2   . ZETIA 10 MG tablet  TAKE 1 TABLET DAILY   90 tablet   1       Allergies:  Actos; Pioglitazone; Atenolol; Codeine; Eszopiclone; Eszopiclone; Exenatide; Morphine; Sulfa antibiotics; Sulfacetamide sodium; Vicodin; Exenatide; and Hydrocodone-acetaminophen  Review of Systems: Gen:  Denies  fever, sweats, chills HEENT: Denies blurred vision, double vision,. Cvc:  No dizziness, chest pain or heaviness Resp:   Denies cough or sputum porduction, shortness of breath Gi: Denies swallowing difficulty,  Gu:  Denies bladder incontinence, burning urine Ext:   No Joint pain, stiffness or swelling Skin: No skin rash, easy bruising or bleeding or hives Endoc:  No polyuria, polydipsia Psych: No depression, insomnia or hallucinations  Other:  All other systems negative  Physical Examination:   VS: BP 118/60 mmHg  Pulse 68  Temp(Src) 98.1 F (36.7 C) (Oral)  Ht 5' 5.5" (1.664 m)  Wt 126 lb (57.153 kg)  BMI 20.64 kg/m2  SpO2 98%  General Appearance: No distress  Neuro:without focal findings,  speech normal,  HEENT: PERRLA, EOM intact. Pulmonary: normal breath sounds, No wheezing, No rales;    CardiovascularNormal S1,S2.  No m/r/g.   Abdomen: Benign, Soft, non-tender. Renal:  No costovertebral tenderness  GU:  No performed at this time. Endoc: No evident thyromegaly. Skin:   warm, no rashes, no ecchymosis  Extremities: normal, no cyanosis, clubbing.      Orders for this visit: No orders of the defined types were placed in this encounter.      Thank  you for allowing [facility] to assist in the care of your patient. Our recommendations are noted above.  Please contact us if we can be of further service.   Marda Stalker, MD.  [facility] Office Number: [phone]  Patricia Pesa, M.D.  Vilinda Boehringer, M.D.  Cheral Marker, M.D

## 2014-09-05 NOTE — Telephone Encounter (Signed)
Pt CXR now available in epic done today. Please advise thanks

## 2014-09-15 ENCOUNTER — Other Ambulatory Visit: Payer: Self-pay | Admitting: Cardiovascular Disease

## 2014-09-19 ENCOUNTER — Ambulatory Visit (INDEPENDENT_AMBULATORY_CARE_PROVIDER_SITE_OTHER)
Admission: RE | Admit: 2014-09-19 | Discharge: 2014-09-19 | Disposition: A | Discharge status: Home / Self Care | Payer: Medicare Other | Source: Ambulatory Visit | Attending: Family Medicine | Admitting: Family Medicine

## 2014-09-19 ENCOUNTER — Encounter: Payer: Self-pay | Admitting: Family Medicine

## 2014-09-19 ENCOUNTER — Ambulatory Visit (INDEPENDENT_AMBULATORY_CARE_PROVIDER_SITE_OTHER): Payer: Medicare Other | Admitting: Family Medicine

## 2014-09-19 VITALS — BP 122/60 | HR 80 | Temp 98.2°F | Wt 124.1 lb

## 2014-09-19 DIAGNOSIS — N183 Chronic kidney disease, stage 3 unspecified: Secondary | ICD-10-CM

## 2014-09-19 DIAGNOSIS — M25512 Pain in left shoulder: Secondary | ICD-10-CM

## 2014-09-19 DIAGNOSIS — J9611 Chronic respiratory failure with hypoxia: Secondary | ICD-10-CM

## 2014-09-19 DIAGNOSIS — L299 Pruritus, unspecified: Secondary | ICD-10-CM | POA: Diagnosis not present

## 2014-09-19 DIAGNOSIS — I25810 Atherosclerosis of coronary artery bypass graft(s) without angina pectoris: Secondary | ICD-10-CM

## 2014-09-19 DIAGNOSIS — E1121 Type 2 diabetes mellitus with diabetic nephropathy: Secondary | ICD-10-CM

## 2014-09-19 DIAGNOSIS — I5032 Chronic diastolic (congestive) heart failure: Secondary | ICD-10-CM

## 2014-09-19 DIAGNOSIS — M19012 Primary osteoarthritis, left shoulder: Secondary | ICD-10-CM | POA: Diagnosis not present

## 2014-09-19 DIAGNOSIS — R110 Nausea: Secondary | ICD-10-CM | POA: Diagnosis not present

## 2014-09-19 NOTE — Patient Instructions (Addendum)
Stop [place]. If sugars creep up we will restart this.  Change zofran 4mg  to once daily as needed for nausea.  Xray of left shoulder today.  I think you have arthritis of that shoulder and may be developing frozen shoulder. May use tylenol or tramadol for pain, and ice or heating pad (whichever soothes shoulder better). Do exercises provided today.  Return as needed or in 3-4 months for follow up.

## 2014-09-19 NOTE — Progress Notes (Signed)
BP 122/60 mmHg  Pulse 80  Temp(Src) 98.2 F (36.8 C) (Oral)  Wt 124 lb 1.9 oz (56.3 kg)  SpO2 98%   CC: 3 mo f/u visit  Subjective:    Patient ID: Christine Blanchard, female    DOB: 10/29/1928, 81 y.o.   MRN: [id number]  HPI: Christine Blanchard is a 81 y.o. female presenting on 09/19/2014 for Follow-up   Christine Blanchard is a pleasant 81 yo with h/o HTN, DM, h/o CVA after cath, severe CAD s/p multiple interventions now planned medical management, diastolic CHF and chronic hypoxemic respiratory failure with both obstructive and restrictive components and respiratory muscle weakness who is oxygen dependent. She presents today for 3 mo f/u visit. She did recently have hip fracture after fall at home s/p hip fixation at [facility] as well as E coli UTI treated with cipro.   Persistent pruritis - on benadryl prn and regular moisturizing. This has improved. Not needing benadryl. Persistent nausea - labwork unrevealing. Taking zofran 17m daily scheduled. This has also improved, pt unsure if needs zofran Controlled T2DM - last A1c 6.2%. Compliant with meds, denies low sugars. MDD - stable on celexa, not interested in lower dose. HLD - off fibrate and zetia, only on lipitor. Levels stable. CAD - off ranexa and doing well. Followed by Dr GRockey Situwith medical management only.   Saw Dr RAshby Dawespulmonology who recommended pulm rehab - pt has decided not to pursue this. Has f/u with Dr RRockne Menghini   L arm pain - started 1 mo ago while reaching back. Endorses pain with use. Unsure if coming from neck or arm. Endorses pain that radiates to neck and down to fingers, associated with finger numbness. Denies right side pain. Denies significant midline neck pain. Takes tylenol with dinner, takes ultram for worse pain - with improvement.   CERVICAL FUSION Date: 2002 anterior decompression and fusion C6/7  She is OFF ranexa and zetia  Relevant past medical, surgical, family and social history reviewed and updated as indicated.  Interim medical history since our last visit reviewed. Allergies and medications reviewed and updated. Current Outpatient Prescriptions on File Prior to Visit  Medication Sig  . acetaminophen (TYLENOL) 500 MG tablet Take 1,000 mg by mouth 3 (three) times daily as needed.  .Marland Kitchenaspirin 81 MG tablet Take 81 mg by mouth daily.   .Marland Kitchenatorvastatin (LIPITOR) 20 MG tablet TAKE 1 TABLET NIGHTLY  . Casanthranol-Docusate Sodium 30-100 MG CAPS as needed.    . cetirizine (ZYRTEC ALLERGY) 10 MG tablet Take 1 tablet (10 mg total) by mouth daily.  . citalopram (CELEXA) 10 MG tablet TAKE 1 TABLET DAILY  . clopidogrel (PLAVIX) 75 MG tablet TAKE 1 TABLET DAILY  . docusate sodium (COLACE) 100 MG capsule Take 1 capsule (100 mg total) by mouth daily.  . fluticasone (FLONASE) 50 MCG/ACT nasal spray Place 2 sprays into both nostrils daily.  .Marland Kitchenglucose blood (FREESTYLE LITE) test strip Use to check sugar three times daily Dx: 250.40  . glucose monitoring kit (FREESTYLE) monitoring kit 1 each by Does not apply route as needed for other.  . Insulin Glargine (LANTUS SOLOSTAR) 100 UNIT/ML Solostar Pen Inject 10 Units into the skin daily at 10 pm.  . Insulin Pen Needle 30G X 5 MM MISC Use as directed to inject insulin  . isosorbide mononitrate (IMDUR) 30 MG 24 hr tablet Take 1 tablet (30 mg total) by mouth at bedtime.  .Marland KitchenKLOR-CON M20 20 MEQ tablet TAKE ONE-HALF (1/2) TABLET DAILY.  TAKE 1 TABLET DAILY ONLY WHEN YOU TAKE LASIX  . Lancets (FREESTYLE) lancets Use to check sugar three times daily Dx: 250.40  . LORazepam (ATIVAN) 1 MG tablet Take 1 tablet (1 mg total) by mouth at bedtime. insomnia  . metFORMIN (GLUCOPHAGE-XR) 500 MG 24 hr tablet Take 1 tablet (500 mg total) by mouth at bedtime.  . metoprolol tartrate (LOPRESSOR) 25 MG tablet TAKE 1 TABLET TWICE A DAY  . Multiple Vitamin (MULTIVITAMIN) capsule Take 1 capsule by mouth daily.    . nitroGLYCERIN (NITROSTAT) 0.4 MG SL tablet DISSOLVE 1 TABLET UNDER THE TONGUE AS NEEDED    . ondansetron (ZOFRAN) 4 MG tablet Take 1 tablet (4 mg total) by mouth daily as needed for nausea or vomiting. 4pm  . prednisoLONE sodium phosphate (INFLAMASE FORTE) 1 % ophthalmic solution Place 1 drop into both eyes daily.   . RABEprazole (ACIPHEX) 20 MG tablet TAKE 1 TABLET DAILY  . torsemide (DEMADEX) 20 MG tablet Take 40 mg by mouth as directed. Extra $RemoveBeforeD'2mg'[id number]$  MWF if needed  . traMADol (ULTRAM) 50 MG tablet Take 1 tablet (50 mg total) by mouth every 6 (six) hours as needed for moderate pain.  . traZODone (DESYREL) 50 MG tablet Take 0.5 tablets (25 mg total) by mouth at bedtime as needed for sleep.  . Vitamin D, Ergocalciferol, (DRISDOL) 50000 UNITS CAPS capsule TAKE 1 CAPSULE ONCE EVERY WEEK  . dimenhyDRINATE (DRAMAMINE) 50 MG tablet Take 50 mg by mouth at bedtime as needed.  . diphenhydrAMINE (BENADRYL) 25 mg capsule Take 1 capsule (25 mg total) by mouth 3 (three) times daily as needed for itching. (Patient not taking: Reported on 09/19/2014)  . fluconazole (DIFLUCAN) 150 MG tablet Take 1 tablet (150 mg total) by mouth once. (Patient not taking: Reported on 09/19/2014)   No current facility-administered medications on file prior to visit.    Review of Systems Per HPI unless specifically indicated above     Objective:    BP 122/60 mmHg  Pulse 80  Temp(Src) 98.2 F (36.8 C) (Oral)  Wt 124 lb 1.9 oz (56.3 kg)  SpO2 98%  Wt Readings from Last 3 Encounters:  09/19/14 124 lb 1.9 oz (56.3 kg)  09/05/14 126 lb (57.153 kg)  08/28/14 124 lb (56.246 kg)    Physical Exam  Constitutional: She appears well-developed and well-nourished. No distress.  HENT:  Mouth/Throat: Oropharynx is clear and moist. No oropharyngeal exudate.  Neck: Normal range of motion. Neck supple.  Cardiovascular: Normal rate, regular rhythm, normal heart sounds and intact distal pulses.   No murmur heard. Pulmonary/Chest: Effort normal and breath sounds normal. No respiratory distress. She has no wheezes. She has no  rales.  Faint crackles bibasilarly  Musculoskeletal: She exhibits no edema.  No significant midline cervical neck pain FROM at neck R shoulder WNL L Shoulder exam: No deformity of shoulders on inspection. Tender to palpation lateral upper arm. + limited ROM past 90 deg abduction and forward flexion, both active and passive + pain with SITS testing. + impingement. + pain with rotation of humeral head in [facility] joint.   Lymphadenopathy:    She has no cervical adenopathy.  Skin: Skin is warm and dry. No rash noted.  Psychiatric: She has a normal mood and affect.  Nursing note and vitals reviewed.  Results for orders placed or performed in visit on 06/04/14  Lipid panel  Result Value Ref Range   Cholesterol 163 0 - 200 mg/dL   Triglycerides 197.0 (H) 0.0 - 149.0 mg/dL  HDL 48.50 >39.00 mg/dL   VLDL 39.4 0.0 - 40.0 mg/dL   LDL Cholesterol 75 0 - 99 mg/dL   Total CHOL/HDL Ratio 3    NonHDL 114.50   Hemoglobin A1c  Result Value Ref Range   Hgb A1c MFr Bld 6.2 4.6 - 6.5 %  Renal function panel  Result Value Ref Range   Sodium 141 135 - 145 mEq/L   Potassium 4.0 3.5 - 5.1 mEq/L   Chloride 100 96 - 112 mEq/L   CO2 36 (H) 19 - 32 mEq/L   Calcium 10.3 8.4 - 10.5 mg/dL   Albumin 3.9 3.5 - 5.2 g/dL   BUN 8 6 - 23 mg/dL   Creatinine, Ser 0.66 0.40 - 1.20 mg/dL   Glucose, Bld 112 (H) 70 - 99 mg/dL   Phosphorus 4.1 2.3 - 4.6 mg/dL   GFR 90.68 >60.00 mL/min      Assessment & Plan:   Problem List Items Addressed This Visit    Well controlled type 2 diabetes mellitus with nephropathy    Continued great control on metformin 518m XR QHS, januvia 55mdaily, and lantus 10u daily QAM as evidenced by A1c 6.2%. Will continue decreasing diabetic regimen - rec stop jaTongaMonitor sugars over next few months and update if trending high.      Chronic hypoxemic respiratory failure    Has established with Dr RaAshby Dawes appreciate care. Pt has decided not to pursue pulm rehab at this  time. F/u pending.      Chronic diastolic CHF (congestive heart failure)    Overall euvolemic today. Recent echo stable.      CKD (chronic kidney disease) stage 3, GFR 30-59 ml/min    Cr has significantly improved - will continue to monitor.      Nausea    This has calmed down. Will change zofran to QD PRN.      Pruritic condition    This has calmed down. Continue to monitor. Not needing benadryl.      Left shoulder pain - Primary    Anticipate combination of developing frozen shoulder and tendinopathy. Also with arthritis. Check xray today to eval arthritic burden.  Treat with continued tramadol, tylenol, ice/heating pad. Provided with exercises from [facility] Pt advisor on frozen shoulder. Pt declines PT for now.      Relevant Orders   DG Shoulder Left       Follow up plan: Return in about 4 months (around 01/19/2015), or if symptoms worsen or fail to improve, for follow up visit.

## 2014-09-19 NOTE — Progress Notes (Signed)
Pre visit review using our clinic review tool, if applicable. No additional management support is needed unless otherwise documented below in the visit note. 

## 2014-09-20 NOTE — Assessment & Plan Note (Signed)
This has calmed down. Will change zofran to QD PRN.

## 2014-09-20 NOTE — Assessment & Plan Note (Signed)
Cr has significantly improved - will continue to monitor.

## 2014-09-20 NOTE — Assessment & Plan Note (Signed)
Has established with Dr Ashby Dawes - appreciate care. Pt has decided not to pursue pulm rehab at this time. F/u pending.

## 2014-09-20 NOTE — Assessment & Plan Note (Signed)
Continued great control on metformin 500mg  XR QHS, januvia 50mg  daily, and lantus 10u daily QAM as evidenced by A1c 6.2%. Will continue decreasing diabetic regimen - rec stop Tong[place]. Monitor sugars over next few months and update if trending high.

## 2014-09-20 NOTE — Assessment & Plan Note (Signed)
Anticipate combination of developing frozen shoulder and tendinopathy. Also with arthritis. Check xray today to eval arthritic burden.  Treat with continued tramadol, tylenol, ice/heating pad. Provided with exercises from [facility] Pt advisor on frozen shoulder. Pt declines PT for now.

## 2014-09-20 NOTE — Assessment & Plan Note (Signed)
This has calmed down. Continue to monitor. Not needing benadryl.

## 2014-09-20 NOTE — Assessment & Plan Note (Signed)
Overall euvolemic today. Recent echo stable.

## 2014-09-24 ENCOUNTER — Other Ambulatory Visit: Payer: Self-pay | Admitting: Family Medicine

## 2014-09-27 ENCOUNTER — Ambulatory Visit (INDEPENDENT_AMBULATORY_CARE_PROVIDER_SITE_OTHER): Payer: Medicare Other | Admitting: Internal Medicine

## 2014-09-27 ENCOUNTER — Encounter: Payer: Self-pay | Admitting: Internal Medicine

## 2014-09-27 VITALS — BP 122/66 | HR 76 | Ht 65.5 in | Wt 125.0 lb

## 2014-09-27 DIAGNOSIS — I25810 Atherosclerosis of coronary artery bypass graft(s) without angina pectoris: Secondary | ICD-10-CM | POA: Diagnosis not present

## 2014-09-27 DIAGNOSIS — N390 Urinary tract infection, site not specified: Secondary | ICD-10-CM

## 2014-09-27 DIAGNOSIS — I5032 Chronic diastolic (congestive) heart failure: Secondary | ICD-10-CM

## 2014-09-27 DIAGNOSIS — J302 Other seasonal allergic rhinitis: Secondary | ICD-10-CM | POA: Diagnosis not present

## 2014-09-27 DIAGNOSIS — R911 Solitary pulmonary nodule: Secondary | ICD-10-CM

## 2014-09-27 DIAGNOSIS — G250 Essential tremor: Secondary | ICD-10-CM

## 2014-09-27 DIAGNOSIS — I25119 Atherosclerotic heart disease of native coronary artery with unspecified angina pectoris: Secondary | ICD-10-CM

## 2014-09-27 DIAGNOSIS — R1310 Dysphagia, unspecified: Secondary | ICD-10-CM

## 2014-09-27 DIAGNOSIS — J9611 Chronic respiratory failure with hypoxia: Secondary | ICD-10-CM

## 2014-09-27 MED ORDER — TIOTROPIUM BROMIDE MONOHYDRATE 18 MCG IN CAPS: 18.0000 ug | ORAL_CAPSULE | Freq: Every day | RESPIRATORY_TRACT | Status: DC

## 2014-09-27 NOTE — Patient Instructions (Addendum)
--  CT scan and full PFT (lung function test) in next 6 - 8 weeks and follow visit after to review results.  --Start spiriva once daily.

## 2014-09-27 NOTE — Progress Notes (Signed)
* [facility]    Assessment and Plan:  DYSPNEA The patient is the Multifactorial causes of dyspnea including restrictive and obstructive lung disease, chronic angina, deconditioning and likely some degree of chronic aspiration. 06/2011 6MW 725 ft., O2 98% 2L [place] HR peack 90  Chronic hypoxemic respiratory failure --Continuous O2 at 2L/min. --Suspect that this is multifactorial due to combined pulmonary disease, cardiac disease, debility and deconditioning. I will start her on spiriva empirically to see if this helps the breathing.  --Will check a PFT to better quantify her lung disease.   Congestive heart failure --Chronic congestive heart failure and volume overload with CAD, she is following with cardiology.  --Continue current cardiac regimen and diuretic.    Lung nodule  -Will check CT chest before next visit. (CXR showed possible 1.5 cm retrocardiac density, though not seen on lateral view.)  Rhinitis -Continue flonase.    Date: 09/27/2014  MRN# [id number] KRIZIA FLIGHT 1928-10-26   CC:  Chief Complaint  Patient presents with  . Follow-up    review cxr. pt did not have pft ordered at last ov.  pt c/o sob with any exertion.      HPI:    Ms. Morello is an 81 y/o female with coronary artery disease and chronic angina who has been followed by [facility] in [facility] (by Dr. Lamonte Sakai) for years and [facility] in [place] since 02/2011 for chronic dyspnea and hypoxemic respiratory failure. Objectively she has both restrictive and obstructive disease and low MIP and MEP. She also likely has some degree of chronic aspiration. She has severe coronary artery disease with chronic angina, debility, unstable gait, and lives in a nursing home. She has been on oxygen at 2L, she ambulates with a walker, she lives in assisted living, she walks to the dining room. She has been on oxygen for about 4 years. She had a hip fracture and has completed PT about 6 weeks ago,  since that time she has not been very active.  Today she feels that her breathing is feeling OK, she did not feel that she was quite up to going back to pulmonary rehab.    Medication:   Current Outpatient Rx  Name  Route  Sig  Dispense  Refill  . acetaminophen (TYLENOL) 500 MG tablet   Oral   Take 1,000 mg by mouth 3 (three) times daily as needed.         Marland Kitchen aspirin 81 MG tablet   Oral   Take 81 mg by mouth daily.          Marland Kitchen atorvastatin (LIPITOR) 20 MG tablet      TAKE 1 TABLET NIGHTLY   90 tablet   2   . BD AUTOSHIELD DUO 30G X 5 MM MISC      USE AS DIRECTED WITH INSULIN   100 each   0     Dispense as written.   Sarajane Marek Sodium 30-100 MG CAPS      as needed.           . cetirizine (ZYRTEC ALLERGY) 10 MG tablet   Oral   Take 1 tablet (10 mg total) by mouth daily.   90 tablet   1   . citalopram (CELEXA) 10 MG tablet      TAKE 1 TABLET DAILY   90 tablet   2   . clopidogrel (PLAVIX) 75 MG tablet      TAKE 1 TABLET DAILY   90 tablet  3   . dimenhyDRINATE (DRAMAMINE) 50 MG tablet   Oral   Take 50 mg by mouth at bedtime as needed.         . diphenhydrAMINE (BENADRYL) 25 mg capsule   Oral   Take 1 capsule (25 mg total) by mouth 3 (three) times daily as needed for itching.   30 capsule   0   . docusate sodium (COLACE) 100 MG capsule   Oral   Take 1 capsule (100 mg total) by mouth daily.   90 capsule   1   . fluconazole (DIFLUCAN) 150 MG tablet   Oral   Take 1 tablet (150 mg total) by mouth once.   1 tablet   0   . fluticasone (FLONASE) 50 MCG/ACT nasal spray   Each Nare   Place 2 sprays into both nostrils daily.   48 g   3   . glucose blood (FREESTYLE LITE) test strip      Use to check sugar three times daily Dx: 250.40   100 each   3   . glucose monitoring kit (FREESTYLE) monitoring kit   Does not apply   1 each by Does not apply route as needed for other.   1 each   0   . Insulin Glargine (LANTUS SOLOSTAR)  100 UNIT/ML Solostar Pen   Subcutaneous   Inject 10 Units into the skin daily at 10 pm.   15 mL   2   . isosorbide mononitrate (IMDUR) 30 MG 24 hr tablet   Oral   Take 1 tablet (30 mg total) by mouth at bedtime.   90 tablet   4   . KLOR-CON M20 20 MEQ tablet      TAKE ONE-HALF (1/2) TABLET DAILY. TAKE 1 TABLET DAILY ONLY WHEN YOU TAKE LASIX   90 tablet   3   . Lancets (FREESTYLE) lancets      Use to check sugar three times daily Dx: 250.40   100 each   3   . LORazepam (ATIVAN) 1 MG tablet   Oral   Take 1 tablet (1 mg total) by mouth at bedtime. insomnia   90 tablet   1   . metFORMIN (GLUCOPHAGE-XR) 500 MG 24 hr tablet   Oral   Take 1 tablet (500 mg total) by mouth at bedtime.         . metoprolol tartrate (LOPRESSOR) 25 MG tablet      TAKE 1 TABLET TWICE A DAY   180 tablet   3   . Multiple Vitamin (MULTIVITAMIN) capsule   Oral   Take 1 capsule by mouth daily.           . nitroGLYCERIN (NITROSTAT) 0.4 MG SL tablet      DISSOLVE 1 TABLET UNDER THE TONGUE AS NEEDED   30 tablet   3   . ondansetron (ZOFRAN) 4 MG tablet   Oral   Take 1 tablet (4 mg total) by mouth daily as needed for nausea or vomiting. 4pm         . prednisoLONE sodium phosphate (INFLAMASE FORTE) 1 % ophthalmic solution   Both Eyes   Place 1 drop into both eyes daily.          . RABEprazole (ACIPHEX) 20 MG tablet      TAKE 1 TABLET DAILY   90 tablet   2   . torsemide (DEMADEX) 20 MG tablet   Oral   Take 40 mg by mouth as directed.  Extra 80m MWF if needed         . traMADol (ULTRAM) 50 MG tablet   Oral   Take 1 tablet (50 mg total) by mouth every 6 (six) hours as needed for moderate pain.   30 tablet   3   . traZODone (DESYREL) 50 MG tablet   Oral   Take 0.5 tablets (25 mg total) by mouth at bedtime as needed for sleep.         . Vitamin D, Ergocalciferol, (DRISDOL) 50000 UNITS CAPS capsule      TAKE 1 CAPSULE ONCE EVERY WEEK   13 capsule   2        Allergies:  Actos; Pioglitazone; Atenolol; Codeine; Eszopiclone; Eszopiclone; Exenatide; Morphine; Sulfa antibiotics; Sulfacetamide sodium; Vicodin; Exenatide; and Hydrocodone-acetaminophen  Review of Systems: Gen:  Denies  fever, sweats, chills HEENT: Denies blurred vision, double vision,. Cvc:  No dizziness, chest pain or heaviness Resp:   Denies cough or sputum porduction, shortness of breath Gi: Denies swallowing difficulty,  Gu:  Denies bladder incontinence, burning urine Ext:   No Joint pain, stiffness or swelling Skin: No skin rash, easy bruising or bleeding or hives Endoc:  No polyuria, polydipsia Psych: No depression, insomnia or hallucinations  Other:  All other systems negative  Physical Examination:   VS: BP 122/66 mmHg  Pulse 76  Ht 5' 5.5" (1.664 m)  Wt 56.7 kg (125 lb)  BMI 20.48 kg/m2  SpO2 95%  General Appearance: No distress  Neuro:without focal findings,  speech normal,  HEENT: PERRLA, EOM intact. Pulmonary: normal breath sounds, No wheezing, No rales;    CardiovascularNormal S1,S2.  No m/r/g.   Abdomen: Benign, Soft, non-tender. Renal:  No costovertebral tenderness  GU:  No performed at this time. Endoc: No evident thyromegaly. Skin:   warm, no rashes, no ecchymosis  Extremities: normal, no cyanosis, clubbing.     Thank  you for allowing [facility] to assist in the care of your patient. Our recommendations are noted above.  Please contact [place] we can be of further service.   DMarda Stalker MD.  [facility] Office Number: [phone] DPatricia Pesa M.D.  VVilinda Boehringer M.D.  DCheral Marker M.D

## 2014-10-06 ENCOUNTER — Encounter: Payer: Self-pay | Admitting: Family Medicine

## 2014-10-08 ENCOUNTER — Ambulatory Visit: Payer: Medicare Other | Admitting: Family Medicine

## 2014-10-08 ENCOUNTER — Encounter: Payer: Self-pay | Admitting: Family Medicine

## 2014-10-08 ENCOUNTER — Telehealth: Payer: Self-pay

## 2014-10-08 MED ORDER — LORAZEPAM 1 MG PO TABS: 1.0000 mg | ORAL_TABLET | Freq: Every day | ORAL | Status: DC

## 2014-10-08 NOTE — Telephone Encounter (Signed)
PLEASE NOTE: All timestamps contained within this report are represented as [place] Standard Time. CONFIDENTIALTY NOTICE: This fax transmission is intended only for the addressee. It contains information that is legally privileged, confidential or otherwise protected from use or disclosure. If you are not the intended recipient, you are strictly prohibited from reviewing, disclosing, copying using or disseminating any of this information or taking any action in reliance on or regarding this information. If you have received this fax in error, please notify us immediately by telephone so that we can arrange for its return to [place]. Phone: [phone], Toll-Free: [phone], Fax: [phone] Page: 1 of 1 Call Id: [id number] Christine Blanchard: Christine Blanchard Gender: Female DOB: 10-04-1928 Age: 81 Y 4 M 6 D Return Phone Number: Address: City/State/Zip: [place] Client [place] Night - Client Client Site [place] Physician Ria Bush Contact Type Call Call Type Triage / Clinical Caller Blanchard [name] Relationship To Patient Provider Return Phone Number Please choose phone number Chief Complaint Prescription Refill or Medication Request (non symptomatic) Initial Comment [name] with [name] at [phone]. Needs orders for medication, emergency (Ativan) Nurse Assessment Guidelines Guideline Title Affirmed Question Affirmed Notes Nurse Date/Time (Eastern Time) Disp. Time Eilene Ghazi Time) Disposition Final User 10/07/2014 2:23:36 PM Clinical Call Yes Mechele Dawley, RN, Amy After Care Instructions Given Call Event Type User Date / Time Description Comments User: Susanne Borders, RN Date/Time (Eastern Time): 10/07/2014 2:23:21 PM SPOKE [place] THAT THE PATIENT IS OUT OF ATIVAN AND SHE NEEDS A DOSE AT 2200  TONIGHT. SHE IS WANTING TO KNOW WHEN THE MEDICATION CAN BE SENT IN. INFORMED HER THAT WE ARE ON CALL FOR THE MD. SHE IS WANTING TO SPEAK TO SOMEONE ONCALL FOR THE MEDICATION - SOMEONE WHO HANDLES HER MEDICATIONS WHERE THEY ARE ORDERED AND DELIVERED FROM. SHE WILL CALL THE CORRECT DEPARTMENT. WILL CLOSE THIS CALL.

## 2014-10-08 NOTE — Telephone Encounter (Signed)
Printed and in Kim's box 

## 2014-10-08 NOTE — Telephone Encounter (Signed)
[name] in team health note has DOB of Jul 19, 1928. Dr Synthia Innocent pt name Christine Blanchard has DOB of 11/20/1928; pt is resident at [facility] and does have Ativan on med list.

## 2014-10-08 NOTE — Telephone Encounter (Signed)
Ok to refill? Needs written script to fax to pharmacy. Thanks!

## 2014-10-08 NOTE — Telephone Encounter (Signed)
Please see Mychart message.

## 2014-10-08 NOTE — Telephone Encounter (Signed)
Rx faxed to [facility] as requested.

## 2014-10-09 ENCOUNTER — Ambulatory Visit (INDEPENDENT_AMBULATORY_CARE_PROVIDER_SITE_OTHER): Payer: Medicare Other | Admitting: Internal Medicine

## 2014-10-09 DIAGNOSIS — R911 Solitary pulmonary nodule: Secondary | ICD-10-CM

## 2014-10-09 LAB — PULMONARY FUNCTION TEST
DL/VA % PRED: 93 %
DL/VA: 4.5 ml/min/mmHg/L
DLCO unc % pred: 56 %
DLCO unc: 13.68 ml/min/mmHg
FEF 25-75 POST: 1.7 L/s
FEF 25-75 PRE: 1.2 L/s
FEF2575-%Change-Post: 42 %
FEF2575-%PRED-POST: 144 %
FEF2575-%PRED-PRE: 101 %
FEV1-%Change-Post: 7 %
FEV1-%PRED-PRE: 68 %
FEV1-%Pred-Post: 73 %
FEV1-POST: 1.32 L
FEV1-Pre: 1.22 L
FEV1FVC-%Change-Post: 9 %
FEV1FVC-%PRED-PRE: 108 %
FEV6-%CHANGE-POST: -1 %
FEV6-%PRED-POST: 66 %
FEV6-%Pred-Pre: 67 %
FEV6-POST: 1.52 L
FEV6-Pre: 1.54 L
FEV6FVC-%PRED-POST: 106 %
FEV6FVC-%Pred-Pre: 106 %
FVC-%CHANGE-POST: -1 %
FVC-%PRED-POST: 62 %
FVC-%Pred-Pre: 63 %
FVC-PRE: 1.54 L
FVC-Post: 1.52 L
POST FEV6/FVC RATIO: 100 %
PRE FEV1/FVC RATIO: 79 %
Post FEV1/FVC ratio: 87 %
Pre FEV6/FVC Ratio: 100 %

## 2014-10-09 NOTE — Telephone Encounter (Signed)
Ativan sent in yesterday.

## 2014-10-09 NOTE — Progress Notes (Signed)
PFT performed today. 

## 2014-10-14 ENCOUNTER — Other Ambulatory Visit: Payer: Self-pay | Admitting: Family Medicine

## 2014-10-15 ENCOUNTER — Telehealth: Payer: Self-pay | Admitting: Internal Medicine

## 2014-10-15 MED ORDER — FLUTICASONE PROPIONATE 50 MCG/ACT NA SUSP: 2.0000 | Freq: Every day | NASAL | Status: DC

## 2014-10-15 NOTE — Telephone Encounter (Signed)
Called and spoke to pt. Pt is requesting a refill on flonase. Rx sent to preferred pharmacy. Pt verbalized understanding and denied any further questions or concerns at this time.

## 2014-10-16 ENCOUNTER — Other Ambulatory Visit: Payer: Self-pay | Admitting: Family Medicine

## 2014-10-16 NOTE — Telephone Encounter (Signed)
Last office visit 09/19/2014.  Ok to refill?

## 2014-10-17 MED ORDER — TRAZODONE HCL 50 MG PO TABS: 25.0000 mg | ORAL_TABLET | Freq: Every evening | ORAL | Status: DC | PRN

## 2014-10-17 MED ORDER — ONDANSETRON HCL 4 MG PO TABS: 4.0000 mg | ORAL_TABLET | Freq: Every day | ORAL | Status: DC | PRN

## 2014-10-18 ENCOUNTER — Other Ambulatory Visit: Payer: Self-pay | Admitting: Family Medicine

## 2014-10-19 ENCOUNTER — Other Ambulatory Visit: Payer: Self-pay | Admitting: Family Medicine

## 2014-10-19 MED ORDER — ACETAMINOPHEN ER 650 MG PO TBCR: 650.0000 mg | EXTENDED_RELEASE_TABLET | Freq: Three times a day (TID) | ORAL | Status: DC

## 2014-10-25 ENCOUNTER — Other Ambulatory Visit: Payer: Self-pay | Admitting: Family Medicine

## 2014-10-25 MED ORDER — POTASSIUM CHLORIDE CRYS ER 10 MEQ PO TBCR: 10.0000 meq | EXTENDED_RELEASE_TABLET | Freq: Every day | ORAL | Status: DC

## 2014-10-25 MED ORDER — TORSEMIDE 20 MG PO TABS: 40.0000 mg | ORAL_TABLET | ORAL | Status: DC

## 2014-10-25 MED ORDER — ACETAMINOPHEN ER 650 MG PO TBCR: 650.0000 mg | EXTENDED_RELEASE_TABLET | Freq: Three times a day (TID) | ORAL | Status: DC

## 2014-10-25 NOTE — Telephone Encounter (Signed)
Last office visit 09/27/2014.  Last refilled 10/17/2014 for #20 with no refills.  Ok to refill?

## 2014-10-26 ENCOUNTER — Other Ambulatory Visit: Payer: Self-pay | Admitting: Cardiovascular Disease

## 2014-10-26 ENCOUNTER — Other Ambulatory Visit: Payer: Self-pay | Admitting: Family Medicine

## 2014-10-28 ENCOUNTER — Other Ambulatory Visit: Payer: Self-pay

## 2014-10-28 ENCOUNTER — Telehealth: Payer: Self-pay | Admitting: *Deleted

## 2014-10-28 MED ORDER — ONDANSETRON HCL 4 MG PO TABS: 4.0000 mg | ORAL_TABLET | Freq: Every day | ORAL | Status: DC | PRN

## 2014-10-28 MED ORDER — CETIRIZINE HCL 10 MG PO TABS: 10.0000 mg | ORAL_TABLET | Freq: Every day | ORAL | Status: DC

## 2014-10-28 NOTE — Telephone Encounter (Signed)
Furosemide not on medication list. LMOVM

## 2014-10-28 NOTE — Telephone Encounter (Signed)
[facility] left a voicemail stating that they need a call back to verify a recent script that they got for Torsemide. They need to verify the directions and quantity.  Call back [phone] Ref #[id number]

## 2014-10-28 NOTE — Telephone Encounter (Signed)
plz verify dosing per med list.

## 2014-10-29 ENCOUNTER — Other Ambulatory Visit: Payer: Self-pay | Admitting: Family Medicine

## 2014-10-29 MED ORDER — DIPHENHYDRAMINE HCL 25 MG PO CAPS: 25.0000 mg | ORAL_CAPSULE | Freq: Three times a day (TID) | ORAL | Status: DC | PRN

## 2014-10-29 MED ORDER — BD AUTOSHIELD DUO 30G X 5 MM MISC: Status: DC

## 2014-10-29 NOTE — Telephone Encounter (Signed)
Ok to refill 

## 2014-10-30 ENCOUNTER — Other Ambulatory Visit: Payer: Self-pay

## 2014-10-30 MED ORDER — ISOSORBIDE MONONITRATE ER 30 MG PO TB24: 30.0000 mg | ORAL_TABLET | Freq: Every day | ORAL | Status: DC

## 2014-10-30 MED ORDER — ONDANSETRON HCL 4 MG PO TABS: 4.0000 mg | ORAL_TABLET | Freq: Every day | ORAL | Status: DC | PRN

## 2014-10-30 NOTE — Telephone Encounter (Signed)
Message left for patient to return my call.  

## 2014-10-30 NOTE — Telephone Encounter (Signed)
Refill sent for Isosorbide mono er 30 mg

## 2014-10-30 NOTE — Telephone Encounter (Signed)
LMOM to have patient call regarding fluid pill; patient taking Torsemide or Lasix.

## 2014-10-30 NOTE — Telephone Encounter (Signed)
Pt left v/m returning call; pt did not leave contact #.

## 2014-10-31 ENCOUNTER — Ambulatory Visit: Payer: Medicare Other

## 2014-10-31 NOTE — Telephone Encounter (Signed)
Message left for patient to return my call. Also sent message via Mychart.

## 2014-10-31 NOTE — Telephone Encounter (Signed)
[facility] left v/m requesting cb within 48 hours about clarification of Torsemide. Use ref # [id number].

## 2014-11-01 ENCOUNTER — Telehealth: Payer: Self-pay | Admitting: *Deleted

## 2014-11-01 ENCOUNTER — Ambulatory Visit
Admission: RE | Admit: 2014-11-01 | Discharge: 2014-11-01 | Disposition: A | Discharge status: Home / Self Care | Payer: Medicare Other | Source: Ambulatory Visit | Attending: Internal Medicine | Admitting: Internal Medicine

## 2014-11-01 DIAGNOSIS — J439 Emphysema, unspecified: Secondary | ICD-10-CM | POA: Diagnosis not present

## 2014-11-01 DIAGNOSIS — I700 Atherosclerosis of aorta: Secondary | ICD-10-CM | POA: Insufficient documentation

## 2014-11-01 DIAGNOSIS — R079 Chest pain, unspecified: Secondary | ICD-10-CM | POA: Diagnosis not present

## 2014-11-01 DIAGNOSIS — R911 Solitary pulmonary nodule: Secondary | ICD-10-CM | POA: Diagnosis present

## 2014-11-01 LAB — POCT I-STAT CREATININE: Creatinine, Ser: 0.9 mg/dL (ref 0.44–1.00)

## 2014-11-01 MED ORDER — IOHEXOL 300 MG/ML  SOLN
75.0000 mL | Freq: Once | INTRAMUSCULAR | Status: AC | PRN
  Administered 2014-11-01: 75 mL via INTRAVENOUS

## 2014-11-01 MED ORDER — TORSEMIDE 20 MG PO TABS: 40.0000 mg | ORAL_TABLET | ORAL | Status: DC

## 2014-11-01 NOTE — Telephone Encounter (Signed)
Spoke with [employer].

## 2014-11-01 NOTE — Telephone Encounter (Signed)
Received notification from insurance that fenofibrate 130 mg is not preferred med. Fenofibrate Tabs 48, 54, 145 or 160 mg; Fenofibrate Caps 67, 134 or 200mg  or Gemfibrozil tab 600mg  are the preferred drugs. She will need a substitute unless there is medical reason that 130mg  is required.

## 2014-11-03 MED ORDER — FENOFIBRATE 54 MG PO TABS: 54.0000 mg | ORAL_TABLET | Freq: Every day | ORAL | Status: DC

## 2014-11-03 NOTE — Telephone Encounter (Signed)
Have sent in fenofibrate tablets 54mg  [facility]

## 2014-11-11 ENCOUNTER — Encounter: Payer: Self-pay | Admitting: Internal Medicine

## 2014-11-11 ENCOUNTER — Ambulatory Visit (INDEPENDENT_AMBULATORY_CARE_PROVIDER_SITE_OTHER): Payer: Medicare Other | Admitting: Internal Medicine

## 2014-11-11 VITALS — BP 110/64 | HR 63 | Wt 128.0 lb

## 2014-11-11 DIAGNOSIS — I25810 Atherosclerosis of coronary artery bypass graft(s) without angina pectoris: Secondary | ICD-10-CM

## 2014-11-11 DIAGNOSIS — I5032 Chronic diastolic (congestive) heart failure: Secondary | ICD-10-CM

## 2014-11-11 NOTE — Progress Notes (Signed)
* [facility]    Assessment and Plan:  DYSPNEA The patient is the Multifactorial causes of dyspnea including restrictive and obstructive lung disease, chronic angina, deconditioning and possibly some degree of chronic aspiration. 06/2011 6MW 725 ft., O2 98% 2L [place] HR peack 90  Chronic hypoxemic respiratory failure --Continuous O2 at 2L/min. --Suspect that this is multifactorial due to combined pulmonary disease, cardiac disease, debility and deconditioning. Continue Spiriva --Will check a PFT to better quantify her lung disease.   Congestive heart failure --Chronic congestive heart failure and volume overload with CAD, she is following with cardiology.  --Continue current cardiac regimen and diuretic.    Lung nodule  -CAT scan of the chest confirmed. There is no longer nodule.  Rhinitis -Continue flonase.    Date: 11/11/2014  MRN# [id number] Christine Blanchard 08-02-1928   CC:  Chief Complaint  Patient presents with  . Follow-up    breathing the same; here for results of CT chest and PFT    HPI:    Christine Blanchard is an 81 y/o female with coronary artery disease and chronic angina who has been followed by [facility] in [facility] (by Dr. Lamonte Sakai) for years and [facility] in [place] since 02/2011 for chronic dyspnea and hypoxemic respiratory failure. Objectively she has both restrictive and obstructive disease and low MIP and MEP. She also likely has some degree of chronic aspiration. She has severe coronary artery disease with chronic angina, debility, unstable gait, and lives in a nursing home. She has been on oxygen at 2L, she ambulates with a walker, she lives in assisted living, she walks to the dining room. She has been on oxygen for about 4 years. She had a hip fracture and has completed PT. She currently lives in assisted living.   Today she feels that her breathing is feeling OK, but she ntoes that she tires easily, she walks with a walker and tries to do  as much as possible. She has to stop once when walking the 70 feet to the dining [place] to catch her breath, she sits down at those times. She continues to use oxygen at 2L, she occasionally puts it up to 3L.  She has continued to use spiriva and feels that it has helped.  Pulmonary function testing from 10/09/2014: Predominantly restrictive lung disease.  At last visit, she was also sent for CT of the chest9/30/16 showed emphysema without lung nodule.   Medication:   Outpatient Encounter Prescriptions as of 11/11/2014  Medication Sig  . acetaminophen (TYLENOL 8 HOUR ARTHRITIS PAIN) 650 MG CR tablet Take 1 tablet (650 mg total) by mouth 3 (three) times daily.  Marland Kitchen aspirin 81 MG tablet Take 81 mg by mouth daily.   Marland Kitchen atorvastatin (LIPITOR) 20 MG tablet TAKE 1 TABLET NIGHTLY  . BD AUTOSHIELD DUO 30G X 5 MM MISC Use as instructed with insulin Dx: E11.21  . Casanthranol-Docusate Sodium 30-100 MG CAPS as needed.    . cetirizine (ZYRTEC ALLERGY) 10 MG tablet Take 1 tablet (10 mg total) by mouth daily.  . citalopram (CELEXA) 10 MG tablet TAKE 1 TABLET DAILY  . clopidogrel (PLAVIX) 75 MG tablet TAKE 1 TABLET DAILY  . dimenhyDRINATE (DRAMAMINE) 50 MG tablet Take 50 mg by mouth at bedtime as needed.  . diphenhydrAMINE (BENADRYL) 25 mg capsule Take 1 capsule (25 mg total) by mouth 3 (three) times daily as needed for itching.  . docusate sodium (COLACE) 100 MG capsule Take 1 capsule (100 mg total) by  mouth daily.  . fenofibrate 54 MG tablet Take 1 tablet (54 mg total) by mouth daily.  . fluticasone (FLONASE) 50 MCG/ACT nasal spray Place 2 sprays into both nostrils daily.  Marland Kitchen glucose blood (FREESTYLE LITE) test strip Use to check sugar three times daily Dx: 250.40  . glucose monitoring kit (FREESTYLE) monitoring kit 1 each by Does not apply route as needed for other.  . Insulin Glargine (LANTUS SOLOSTAR) 100 UNIT/ML Solostar Pen Inject 10 Units into the skin daily at 10 pm.  . isosorbide mononitrate (IMDUR) 30  MG 24 hr tablet Take 1 tablet (30 mg total) by mouth at bedtime.  . Lancets (FREESTYLE) lancets USE TO CHECK SUGAR THREE TIMES A DAY  . LORazepam (ATIVAN) 1 MG tablet Take 1 tablet (1 mg total) by mouth at bedtime. insomnia  . metFORMIN (GLUCOPHAGE-XR) 500 MG 24 hr tablet Take 1 tablet (500 mg total) by mouth at bedtime.  . metoprolol tartrate (LOPRESSOR) 25 MG tablet TAKE 1 TABLET TWICE A DAY  . Multiple Vitamin (MULTIVITAMIN) capsule Take 1 capsule by mouth daily.    . nitroGLYCERIN (NITROSTAT) 0.4 MG SL tablet DISSOLVE 1 TABLET UNDER THE TONGUE AS NEEDED  . ondansetron (ZOFRAN) 4 MG tablet Take 1 tablet (4 mg total) by mouth daily as needed for nausea or vomiting. 4pm  . potassium chloride (K-DUR,KLOR-CON) 10 MEQ tablet Take 1 tablet (10 mEq total) by mouth daily.  . prednisoLONE sodium phosphate (INFLAMASE FORTE) 1 % ophthalmic solution Place 1 drop into both eyes daily.   . RABEprazole (ACIPHEX) 20 MG tablet TAKE 1 TABLET DAILY  . tiotropium (SPIRIVA HANDIHALER) 18 MCG inhalation capsule Place 1 capsule (18 mcg total) into inhaler and inhale daily.  Marland Kitchen torsemide (DEMADEX) 20 MG tablet Take 2 tablets (40 mg total) by mouth as directed. Extra 66m MWF if needed  . traMADol (ULTRAM) 50 MG tablet Take 1 tablet (50 mg total) by mouth every 6 (six) hours as needed for moderate pain.  . traZODone (DESYREL) 50 MG tablet Take 0.5 tablets (25 mg total) by mouth at bedtime as needed for sleep.  . Vitamin D, Ergocalciferol, (DRISDOL) 50000 UNITS CAPS capsule TAKE 1 CAPSULE ONCE EVERY WEEK   No facility-administered encounter medications on file as of 11/11/2014.      Allergies:  Actos; Pioglitazone; Atenolol; Codeine; Eszopiclone; Eszopiclone; Exenatide; Morphine; Sulfa antibiotics; Sulfacetamide sodium; Vicodin; Exenatide; and Hydrocodone-acetaminophen  Review of Systems: Gen:  Denies  fever, sweats, chills HEENT: Denies blurred vision, double vision,. Cvc:  No dizziness, chest pain or  heaviness Resp:   Denies cough or sputum porduction, shortness of breath Gi: Denies swallowing difficulty,  Gu:  Denies bladder incontinence, burning urine Ext:   No Joint pain, stiffness or swelling Skin: No skin rash, easy bruising or bleeding or hives Endoc:  No polyuria, polydipsia Psych: No depression, insomnia or hallucinations  Other:  All other systems negative  Physical Examination:   VS: BP 110/64 mmHg  Pulse 63  Wt 128 lb (58.06 kg)  SpO2 97%  General Appearance: No distress  Neuro:without focal findings,  speech normal,  HEENT: PERRLA, EOM intact. Pulmonary: normal breath sounds, No wheezing, No rales;    CardiovascularNormal S1,S2.  No m/r/g.   Abdomen: Benign, Soft, non-tender. Renal:  No costovertebral tenderness  GU:  No performed at this time. Endoc: No evident thyromegaly. Skin:   warm, no rashes, no ecchymosis  Extremities: normal, no cyanosis, clubbing.     Thank  you for allowing [facility]  to assist in the care of your patient. Our recommendations are noted above.  Please contact us if we can be of further service.   Marda Stalker, MD.  [facility]   Patricia Pesa, M.D.  Vilinda Boehringer, M.D.  Cheral Marker, M.D

## 2014-11-11 NOTE — Patient Instructions (Signed)
Continue to use spiriva once per day, continue on oxygen.  Follow up in 6 months, but call earlier if you are having breathing problems.

## 2014-11-15 DIAGNOSIS — Z23 Encounter for immunization: Secondary | ICD-10-CM | POA: Diagnosis not present

## 2014-11-21 ENCOUNTER — Other Ambulatory Visit: Payer: Self-pay | Admitting: *Deleted

## 2014-11-21 MED ORDER — TRAMADOL HCL 50 MG PO TABS: 50.0000 mg | ORAL_TABLET | Freq: Four times a day (QID) | ORAL | Status: DC | PRN

## 2014-11-21 NOTE — Telephone Encounter (Signed)
Printed and in Kim's box 

## 2014-11-21 NOTE — Telephone Encounter (Signed)
Ok to refill? Will need written Rx to fax to [facility].

## 2014-11-22 NOTE — Telephone Encounter (Signed)
Rx faxed to [facility].

## 2014-11-28 ENCOUNTER — Encounter: Payer: Self-pay | Admitting: Cardiovascular Disease

## 2014-11-28 ENCOUNTER — Ambulatory Visit (INDEPENDENT_AMBULATORY_CARE_PROVIDER_SITE_OTHER): Payer: Medicare Other | Admitting: Cardiovascular Disease

## 2014-11-28 VITALS — BP 110/54 | HR 65 | Ht 64.75 in | Wt 130.2 lb

## 2014-11-28 DIAGNOSIS — I25119 Atherosclerotic heart disease of native coronary artery with unspecified angina pectoris: Secondary | ICD-10-CM | POA: Diagnosis not present

## 2014-11-28 DIAGNOSIS — R29898 Other symptoms and signs involving the musculoskeletal system: Secondary | ICD-10-CM

## 2014-11-28 DIAGNOSIS — I25810 Atherosclerosis of coronary artery bypass graft(s) without angina pectoris: Secondary | ICD-10-CM

## 2014-11-28 DIAGNOSIS — J9611 Chronic respiratory failure with hypoxia: Secondary | ICD-10-CM

## 2014-11-28 DIAGNOSIS — I10 Essential (primary) hypertension: Secondary | ICD-10-CM

## 2014-11-28 DIAGNOSIS — I5032 Chronic diastolic (congestive) heart failure: Secondary | ICD-10-CM

## 2014-11-28 DIAGNOSIS — E785 Hyperlipidemia, unspecified: Secondary | ICD-10-CM

## 2014-11-28 NOTE — Assessment & Plan Note (Signed)
Blood pressure is well controlled on today's visit. No changes made to the medications. 

## 2014-11-28 NOTE — Patient Instructions (Signed)
You are doing well. No medication changes were made.  Please call us if you have new issues that need to be addressed before your next appt.  Your physician wants you to follow-up in: 6 months.  You will receive a reminder letter in the mail two months in advance. If you don't receive a letter, please call our office to schedule the follow-up appointment.   

## 2014-11-28 NOTE — Assessment & Plan Note (Signed)
Currently with no symptoms of angina. She will be very high risk of stroke during catheterization given diffuse atherosclerosis in her aorta

## 2014-11-28 NOTE — Assessment & Plan Note (Signed)
We will continue current dose of Lipitor LDL slightly above goal but very close

## 2014-11-28 NOTE — Assessment & Plan Note (Signed)
Currently with no symptoms of angina. No further workup at this time. Continue current medication regimen. 

## 2014-11-28 NOTE — Assessment & Plan Note (Signed)
She continues to report that legs will give out on her. She walks to the cafeteria 3 times per day. Sedentary at baseline Does not want PT

## 2014-11-28 NOTE — Assessment & Plan Note (Signed)
She reports stable respiratory status at this time No recent COPD exacerbation

## 2014-11-28 NOTE — Progress Notes (Signed)
Patient ID: Christine Blanchard, female    DOB: September 11, 1928, 81 y.o.   MRN: [id number]  HPI Comments: Christine Blanchard is a very pleasant 75 -year-old woman with history of coronary artery disease, bypass surgery in 2004, hyperlipidemia, PCI 6 months after her bypass, repeat PCI one year later (She does report having a stroke after her cardiac catheterization), diabetes, hypertension, spinal stenosis,  who currently lives in a nursing home in [place], who has chronic unsteady gait , history of falls,  with chronic shortness of breath on oxygen who presents for routine followup of her shortness of breath, chronic diastolic CHF. Previous Pulmonary workup has revealed restrictive lung disease.  She currently lives at [facility]. She has chronic hypoxemic respiratory failure with both obstructive and restrictive components and respiratory muscle weakness who is oxygen dependent. She presents today for follow-up of her shortness of breath  In follow-up today, she reports that she is stable. She has bilateral shoulder pain, currently taking a pain pill with improvement of her symptoms. Previously was crying in the evenings secondary to discomfort. Seems to be better now.  Recent echocardiogram showing normal LV function, normal right heart pressures. She has close follow-up with pulmonary. She takes torsemide 40 mg daily, extra 20 mg in the afternoon every other day On this regimen, creatinine 0.9, breathing stable. Other lab work reviewed with her showing hemoglobin A1c 6.2, total cholesterol 163, LDL 73.  Other past medical history  mechanical fall February 2016 with hip fracture. Initially sent to [facility], transferred to [facility] at the request of family where she underwent hip repair with several screws.  Transfer back to rehabilitation. She is not full weightbearing at this time. Partial weightbearing with a walker. Gait continues to be unsteady.  History of chronic nausea, on Zofran  Anorexia has been going on  since November 2015 with significant weight loss since her prior clinic visit.  Previously was on stool softeners, does not taking this on a regular basis.  No falls or syncope.  No regular exercise program.  Denies any cough. On oxygen, level at 2 L even with ambulation.  Previous cardiac CTA in the past for atypical chest pain that showed: Patent SVG to D1 with poor distal runoff,  Occluded LIMA to LAD.  Distal LAD never visualized,  RCA and circumflex patient with multiple 50% or less calcfic Lesions,  Poor distal runoff from stented IM/LAD and small D1 supplied by SVG  Previous  Echocardiogram was essentially normal with normal ejection fraction greater than 93%, diastolic dysfunction, normal right ventricular systolic pressures  Stress test showed no ischemia with ejection fraction greater than 55%   12/2011 In the hospital, total cholesterol 146, LDL 58, HDL 65  Allergies  Allergen Reactions  . Actos [Pioglitazone Hydrochloride] Shortness Of Breath    CHF  . Pioglitazone Shortness Of Breath    CHF REACTION: heart failure  . Atenolol Other (See Comments)    lethargy  . Codeine     REACTION: vomiting  . Eszopiclone Other (See Comments)    Nightmares  . Eszopiclone   . Exenatide Nausea Only  . Morphine     REACTION: nausea  . Sulfa Antibiotics   . Sulfacetamide Sodium   . Vicodin [Hydrocodone-Acetaminophen] Nausea And Vomiting  . Exenatide Nausea Only  . Hydrocodone-Acetaminophen Nausea And Vomiting    Outpatient Encounter Prescriptions as of 11/28/2014  Medication Sig  . acetaminophen (TYLENOL 8 HOUR ARTHRITIS PAIN) 650 MG CR tablet Take 1 tablet (650 mg total)  by mouth 3 (three) times daily.  Marland Kitchen aspirin 81 MG tablet Take 81 mg by mouth daily.   Marland Kitchen atorvastatin (LIPITOR) 20 MG tablet TAKE 1 TABLET NIGHTLY  . BD AUTOSHIELD DUO 30G X 5 MM MISC Use as instructed with insulin Dx: E11.21  . Casanthranol-Docusate Sodium 30-100 MG CAPS as needed.    . cetirizine (ZYRTEC  ALLERGY) 10 MG tablet Take 1 tablet (10 mg total) by mouth daily.  . citalopram (CELEXA) 10 MG tablet TAKE 1 TABLET DAILY  . clopidogrel (PLAVIX) 75 MG tablet TAKE 1 TABLET DAILY  . dimenhyDRINATE (DRAMAMINE) 50 MG tablet Take 50 mg by mouth at bedtime as needed.  . diphenhydrAMINE (BENADRYL) 25 mg capsule Take 1 capsule (25 mg total) by mouth 3 (three) times daily as needed for itching.  . docusate sodium (COLACE) 100 MG capsule Take 1 capsule (100 mg total) by mouth daily.  . fenofibrate 54 MG tablet Take 1 tablet (54 mg total) by mouth daily.  . fluticasone (FLONASE) 50 MCG/ACT nasal spray Place 2 sprays into both nostrils daily.  Marland Kitchen glucose blood (FREESTYLE LITE) test strip Use to check sugar three times daily Dx: 250.40  . glucose monitoring kit (FREESTYLE) monitoring kit 1 each by Does not apply route as needed for other.  . Insulin Glargine (LANTUS SOLOSTAR) 100 UNIT/ML Solostar Pen Inject 10 Units into the skin daily at 10 pm.  . isosorbide mononitrate (IMDUR) 30 MG 24 hr tablet Take 1 tablet (30 mg total) by mouth at bedtime.  . Lancets (FREESTYLE) lancets USE TO CHECK SUGAR THREE TIMES A DAY  . LORazepam (ATIVAN) 1 MG tablet Take 1 tablet (1 mg total) by mouth at bedtime. insomnia  . metFORMIN (GLUCOPHAGE-XR) 500 MG 24 hr tablet Take 1 tablet (500 mg total) by mouth at bedtime.  . metoprolol tartrate (LOPRESSOR) 25 MG tablet TAKE 1 TABLET TWICE A DAY  . Multiple Vitamin (MULTIVITAMIN) capsule Take 1 capsule by mouth daily.    . nitroGLYCERIN (NITROSTAT) 0.4 MG SL tablet DISSOLVE 1 TABLET UNDER THE TONGUE AS NEEDED  . ondansetron (ZOFRAN) 4 MG tablet Take 1 tablet (4 mg total) by mouth daily as needed for nausea or vomiting. 4pm  . potassium chloride (K-DUR,KLOR-CON) 10 MEQ tablet Take 1 tablet (10 mEq total) by mouth daily.  . prednisoLONE sodium phosphate (INFLAMASE FORTE) 1 % ophthalmic solution Place 1 drop into both eyes daily.   . RABEprazole (ACIPHEX) 20 MG tablet TAKE 1 TABLET  DAILY  . tiotropium (SPIRIVA HANDIHALER) 18 MCG inhalation capsule Place 1 capsule (18 mcg total) into inhaler and inhale daily.  Marland Kitchen torsemide (DEMADEX) 20 MG tablet Take 2 tablets (40 mg total) by mouth as directed. Extra 51m MWF if needed  . traMADol (ULTRAM) 50 MG tablet Take 1 tablet (50 mg total) by mouth every 6 (six) hours as needed for moderate pain.  . traZODone (DESYREL) 50 MG tablet Take 0.5 tablets (25 mg total) by mouth at bedtime as needed for sleep.  . Vitamin D, Ergocalciferol, (DRISDOL) 50000 UNITS CAPS capsule TAKE 1 CAPSULE ONCE EVERY WEEK   No facility-administered encounter medications on file as of 11/28/2014.    Past Medical History  Diagnosis Date  . Hypertension   . Diabetes mellitus 2003    previously saw Dr. AElyse Hsu . Hyperlipidemia   . History of chicken pox     has had shingles shot  . History of diverticulitis of colon 1991, 1992, 1994  . History of kidney stones 1952  .  CVA (cerebral infarction)     after a stent, minimal R residual weakness, affects speech when tired, ?mild dysequilibrium  . Urinary incontinence     stress/urge, per D. Tannenbaum/[place]  . CAD (coronary artery disease)     severe, s/p 4 cardiac caths, minimally invasive CABG _0 , ICA stent 2004 then LAD stent 2005, MEDICAL MANAGEMENT ONLY, MAY USE REPEATED SL NITRO  . Breathing difficulty 2011    on O2 since 10/2009  . Syncope 2005    s/p w/u including CT scan and 24 hour Holter  . GERD (gastroesophageal reflux disease) 2004  . Dry senile macular degeneration     s/p corneal transplants ([facility])  . Benign essential tremor     toprol XL and remeron  . Herpes zoster ophthalmicus 2009    history  . Hearing loss of both ears 08/2010    audiology eval 08/2010 - mod sensorineural heaing loss, rec trial digital hearing aids and re eval yearly to monitor  . Vitamin D deficiency     on 50k units weekly  . Diastolic CHF ([place]) 03/8411    grade 1, normal EF, mildly dilated LA,  normal PA pressures  . Lumbar spinal stenosis     with severe spondylosis, s/p laminotomy  . Allergic rhinitis   . Chronic kidney disease     stage 3 kidney disease.  . Leg weakness, bilateral 2014    s/p neuro eval - thought multifactorial (diabetic neuropathy, lumbar stenosis, and chronic disease deconditioning).  Conservative management recommended  . Gait instability   . Wedge compression fracture of T10 vertebra ([place]) 2015    by CXR  . Fracture of femoral neck, right ([place]) 03/2014    s/p surgery    Past Surgical History  Procedure Laterality Date  . Cholecystectomy  1985  . Cervical fusion  2002    anterior decompression and fusion C6/7  . Coronary artery bypass graft  2004  . Percutaneous coronary stent intervention (pci-s)  2005    stents x 2 (intermediate and LAD), 2nd complicated by stroke  . Corneal transplant      x3  . Appendectomy  1948  . Breast biopsy  2001    x2, both benign, last mammo 2011, last pap smear 2011  . Cardiac catheterization  2009    x5  . Cataract extraction      bilateral, corneal dystrophy  . Cardiovascular stress test  2010    nuclear, normal  . Dexa  2005    normal  . Lumbar spine surgery  2010    laminotomy (L2/3, 3/4)  . Hip fracture surgery Right 03/2014    closed reduction percutaneous pinning of R femoral neck fracture ([facility])    Social History  reports that she has never smoked. She has never used smokeless tobacco. She reports that she does not drink alcohol or use illicit drugs.  Family History family history includes Cancer in her brother and mother; Heart disease in her father.   Review of Systems  Constitutional: Negative.        General malaise  Respiratory: Positive for shortness of breath.   Cardiovascular: Negative.   Gastrointestinal: Negative.   Musculoskeletal: Positive for gait problem.  Neurological: Negative.   Hematological: Negative.   Psychiatric/Behavioral: Negative.   All other systems reviewed  and are negative.   BP 110/54 mmHg  Pulse 65  Ht 5' 4.75" (1.645 m)  Wt 130 lb 4 oz (59.081 kg)  BMI 21.83 kg/m2  SpO2  95%  Physical Exam  Constitutional: She is oriented to person, place, and time. She appears well-developed and well-nourished.  HENT:  Head: Normocephalic.  Nose: Nose normal.  Mouth/Throat: Oropharynx is clear and moist.  Eyes: Conjunctivae are normal. Pupils are equal, round, and reactive to light.  Neck: Normal range of motion. Neck supple. No JVD present.  Cardiovascular: Normal rate, regular rhythm, S1 normal, S2 normal and intact distal pulses.  Exam reveals no gallop and no friction rub.   Murmur heard.  Crescendo systolic murmur is present with a grade of 2/6  Trace pitting edema around the ankles, low shins  Pulmonary/Chest: Effort normal. No respiratory distress. She has decreased breath sounds. She has no wheezes. She has rales. She exhibits no tenderness.  Abdominal: Soft. Bowel sounds are normal. She exhibits no distension. There is no tenderness.  Musculoskeletal: Normal range of motion. She exhibits no edema or tenderness.  Lymphadenopathy:    She has no cervical adenopathy.  Neurological: She is alert and oriented to person, place, and time. Coordination normal.  Skin: Skin is warm and dry. No rash noted. No erythema.  Psychiatric: She has a normal mood and affect. Her behavior is normal. Judgment and thought content normal.    Assessment and Plan  Nursing note and vitals reviewed.

## 2014-11-28 NOTE — Assessment & Plan Note (Signed)
Appears relatively euvolemic on today's visit. Normal renal function She was hoping to stop the afternoon torsemide. We did discuss whether we should make this on a as-needed basis. Given her tenuous respiratory status, normal renal function over the past year, recommended we continue her current regimen with extra torsemide several days per week in the afternoon

## 2014-12-02 ENCOUNTER — Ambulatory Visit (INDEPENDENT_AMBULATORY_CARE_PROVIDER_SITE_OTHER): Payer: Medicare Other | Admitting: Podiatry

## 2014-12-02 DIAGNOSIS — M79676 Pain in unspecified toe(s): Secondary | ICD-10-CM | POA: Diagnosis not present

## 2014-12-02 DIAGNOSIS — B351 Tinea unguium: Secondary | ICD-10-CM

## 2014-12-02 NOTE — Progress Notes (Signed)
Subjective:     Patient ID: Christine Blanchard, female   DOB: Jan 01, 1929, 81 y.o.   MRN: [id number]  HPI 81 year old female presents the office today with complaints of painful, elongated, thick toenails which she is unable to trim herself. She denies any redness or drainage on the nail sites. She states that her nails rather shoes causing irritation. No other complaints this time.  Review of Systems  All other systems reviewed and are negative.      Objective:   Physical Exam AAO x3, NAD DP/PT pulses palpable bilaterally, CRT less than 3 seconds Protective sensation intact with Simms Weinstein monofilament, vibratory sensation intact, Achilles tendon reflex intact Nails are hypertrophic, dystrophic, discolored, brittle, elongated 10. There is slight erythema around the right third digit toenail on the proximal nail border of there is no ascending cellulitis, drainage, fluctuance, crepitus, malodor. There is no other areas of edema, erythema to the remaining nails. There is tenderness to palpation overlying nails 1-5 bilaterally. No other areas of tenderness to bilateral lower extremities. MMT 5/5, ROM WNL.  No open lesions or pre-ulcerative lesions.  No overlying edema, erythema, increase in warmth to bilateral lower extremities.  No pain with calf compression, swelling, warmth, erythema bilaterally.      Assessment:     81 year old female with symptomatic onychomycosis    Plan:     -Treatment options discussed including all alternatives, risks, and complications -Nail sharply debrided 10 without complication/bleeding. -Discussed importance of daily foot inspection. Call the office immediately if any changes. -Otherwise, follow-up in 3 months or sooner if any problems are to arise. In the meantime I encouraged her to call the office with any question, concerns, change in symptoms. 4 mo.  Roselind Messier DPM

## 2014-12-11 ENCOUNTER — Telehealth: Payer: Self-pay | Admitting: *Deleted

## 2014-12-11 NOTE — Telephone Encounter (Signed)
Order to change tramadol dosing in your IN box for review/signature

## 2014-12-11 NOTE — Telephone Encounter (Signed)
Filled and in my outbox

## 2014-12-23 ENCOUNTER — Encounter: Payer: Self-pay | Admitting: Family Medicine

## 2014-12-23 ENCOUNTER — Ambulatory Visit (INDEPENDENT_AMBULATORY_CARE_PROVIDER_SITE_OTHER): Payer: Medicare Other | Admitting: Family Medicine

## 2014-12-23 VITALS — BP 132/66 | HR 68 | Temp 98.4°F | Wt 128.5 lb

## 2014-12-23 DIAGNOSIS — I5032 Chronic diastolic (congestive) heart failure: Secondary | ICD-10-CM

## 2014-12-23 DIAGNOSIS — F331 Major depressive disorder, recurrent, moderate: Secondary | ICD-10-CM | POA: Diagnosis not present

## 2014-12-23 DIAGNOSIS — N183 Chronic kidney disease, stage 3 unspecified: Secondary | ICD-10-CM

## 2014-12-23 DIAGNOSIS — J9611 Chronic respiratory failure with hypoxia: Secondary | ICD-10-CM | POA: Diagnosis not present

## 2014-12-23 DIAGNOSIS — E1121 Type 2 diabetes mellitus with diabetic nephropathy: Secondary | ICD-10-CM

## 2014-12-23 DIAGNOSIS — I25810 Atherosclerosis of coronary artery bypass graft(s) without angina pectoris: Secondary | ICD-10-CM

## 2014-12-23 DIAGNOSIS — S22070S Wedge compression fracture of T9-T10 vertebra, sequela: Secondary | ICD-10-CM

## 2014-12-23 LAB — BASIC METABOLIC PANEL
BUN: 15 mg/dL (ref 6–23)
CO2: 37 mEq/L — ABNORMAL HIGH (ref 19–32)
CREATININE: 0.73 mg/dL (ref 0.40–1.20)
Calcium: 9.6 mg/dL (ref 8.4–10.5)
Chloride: 93 mEq/L — ABNORMAL LOW (ref 96–112)
GFR: 80.61 mL/min (ref >60.00)
Glucose, Bld: 116 mg/dL — ABNORMAL HIGH (ref 70–99)
Potassium: 3.1 mEq/L — ABNORMAL LOW (ref 3.5–5.1)
Sodium: 138 mEq/L (ref 135–145)

## 2014-12-23 LAB — HEMOGLOBIN A1C: HEMOGLOBIN A1C: 7.3 % — AB (ref 4.6–6.5)

## 2014-12-23 NOTE — Assessment & Plan Note (Signed)
Seems euvolemic. Continue torsemide/potassium dose. check BMP today.

## 2014-12-23 NOTE — Assessment & Plan Note (Signed)
Has declined further evaluation. No current significant back pain. Ongoing arthralgias of shoulders and knees however.

## 2014-12-23 NOTE — Patient Instructions (Addendum)
Blood work today. Continue medicines as up to now. Ok to continue tramadol at 4pm and 10pm.  Return in 3 months for follow up

## 2014-12-23 NOTE — Assessment & Plan Note (Signed)
Somewhat pain related - scheduled tramadol has helped. Ok to continue along with celexa 10mg  daily.

## 2014-12-23 NOTE — Assessment & Plan Note (Signed)
Recheck today. 

## 2014-12-23 NOTE — Assessment & Plan Note (Signed)
Appreciate pulm and cards care of patient. Continue O2.

## 2014-12-23 NOTE — Progress Notes (Signed)
Pre visit review using our clinic review tool, if applicable. No additional management support is needed unless otherwise documented below in the visit note. 

## 2014-12-23 NOTE — Progress Notes (Signed)
BP 132/66 mmHg  Pulse 68  Temp(Src) 98.4 F (36.9 C) (Oral)  Wt 128 lb 8 oz (58.287 kg)   CC: 3 mo f/u visit  Subjective:    Patient ID: Christine Blanchard, female    DOB: [birth year], 81 y.o.   MRN: [id number]  HPI: Christine Blanchard is a 81 y.o. female presenting on 12/23/2014 for Follow-up   Mrs Both is a pleasant 81 yo with h/o HTN, DM, h/o CVA after cath, severe CAD s/p multiple interventions now planned medical management, diastolic CHF and chronic hypoxemic respiratory failure with both obstructive and restrictive components and respiratory muscle weakness who is oxygen dependent. She presents today for 3 mo f/u visit.   She feels well in the mornings, then in the afternoons starts deteriorating - fatigue, emotional (crying spells). Attributes this to worsening pain as day progresses. Started taking tramadol at 4pm and 10pm which has significantly helped. Persistent leg weakness, leg cramping, and R knee pain. On celexa 71m daily.   Controlled T2DM - last A1c 6.2%. Compliant with meds, denies low sugars. We stopped jTongalast visit in an effort to continue to simplify her medical regimen. Chronic hypoxemic resp failure - multifactorial. Continue 2L/min O2 by . On spiriva. Saw Dr RAshby Dawespulmonology who recommended pulm rehab - pt has decided not to pursue this. Chronic diastolic CHF - sees Dr GRockey Situ On PM torsemide PRN a few days a week.  MDD - see above. Continues celexa 116mdaily.  HLD - off fibrate and zetia, only on lipitor. Levels stable.  CAD - off ranexa and doing well. Followed by Dr GoRockey Situith medical management only. High risk for stroke with any planned intervention.   Relevant past medical, surgical, family and social history reviewed and updated as indicated. Interim medical history since our last visit reviewed. Allergies and medications reviewed and updated. Current Outpatient Prescriptions on File Prior to Visit  Medication Sig  . acetaminophen (TYLENOL 8  HOUR ARTHRITIS PAIN) 650 MG CR tablet Take 1 tablet (650 mg total) by mouth 3 (three) times daily.  . Marland Kitchenspirin 81 MG tablet Take 81 mg by mouth daily.   . Marland Kitchentorvastatin (LIPITOR) 20 MG tablet TAKE 1 TABLET NIGHTLY  . BD AUTOSHIELD DUO 30G X 5 MM MISC Use as instructed with insulin Dx: E11.21  . cetirizine (ZYRTEC ALLERGY) 10 MG tablet Take 1 tablet (10 mg total) by mouth daily.  . citalopram (CELEXA) 10 MG tablet TAKE 1 TABLET DAILY  . clopidogrel (PLAVIX) 75 MG tablet TAKE 1 TABLET DAILY  . diphenhydrAMINE (BENADRYL) 25 mg capsule Take 1 capsule (25 mg total) by mouth 3 (three) times daily as needed for itching.  . docusate sodium (COLACE) 100 MG capsule Take 1 capsule (100 mg total) by mouth daily.  . fluticasone (FLONASE) 50 MCG/ACT nasal spray Place 2 sprays into both nostrils daily.  . Marland Kitchenlucose blood (FREESTYLE LITE) test strip Use to check sugar three times daily Dx: 250.40  . glucose monitoring kit (FREESTYLE) monitoring kit 1 each by Does not apply route as needed for other.  . Insulin Glargine (LANTUS SOLOSTAR) 100 UNIT/ML Solostar Pen Inject 10 Units into the skin daily at 10 pm.  . isosorbide mononitrate (IMDUR) 30 MG 24 hr tablet Take 1 tablet (30 mg total) by mouth at bedtime.  . Lancets (FREESTYLE) lancets USE TO CHECK SUGAR THREE TIMES A DAY  . LORazepam (ATIVAN) 1 MG tablet Take 1 tablet (1 mg total) by mouth at bedtime. insomnia  .  metFORMIN (GLUCOPHAGE-XR) 500 MG 24 hr tablet Take 1 tablet (500 mg total) by mouth at bedtime.  . metoprolol tartrate (LOPRESSOR) 25 MG tablet TAKE 1 TABLET TWICE A DAY  . Multiple Vitamin (MULTIVITAMIN) capsule Take 1 capsule by mouth daily.    . nitroGLYCERIN (NITROSTAT) 0.4 MG SL tablet DISSOLVE 1 TABLET UNDER THE TONGUE AS NEEDED  . ondansetron (ZOFRAN) 4 MG tablet Take 1 tablet (4 mg total) by mouth daily as needed for nausea or vomiting. 4pm  . potassium chloride (K-DUR,KLOR-CON) 10 MEQ tablet Take 1 tablet (10 mEq total) by mouth daily.  .  prednisoLONE sodium phosphate (INFLAMASE FORTE) 1 % ophthalmic solution Place 1 drop into both eyes daily.   . RABEprazole (ACIPHEX) 20 MG tablet TAKE 1 TABLET DAILY  . tiotropium (SPIRIVA HANDIHALER) 18 MCG inhalation capsule Place 1 capsule (18 mcg total) into inhaler and inhale daily.  Marland Kitchen torsemide (DEMADEX) 20 MG tablet Take 2 tablets (40 mg total) by mouth as directed. Extra 52m MWF if needed  . traMADol (ULTRAM) 50 MG tablet Take 1 tablet (50 mg total) by mouth 2 (two) times daily as needed for moderate pain.  . traZODone (DESYREL) 50 MG tablet Take 0.5 tablets (25 mg total) by mouth at bedtime as needed for sleep.  .Marland KitchendimenhyDRINATE (DRAMAMINE) 50 MG tablet Take 50 mg by mouth at bedtime as needed.  . fenofibrate 54 MG tablet Take 1 tablet (54 mg total) by mouth daily. (Patient not taking: Reported on 12/23/2014)  . Vitamin D, Ergocalciferol, (DRISDOL) 50000 UNITS CAPS capsule TAKE 1 CAPSULE ONCE EVERY WEEK (Patient not taking: Reported on 12/23/2014)   No current facility-administered medications on file prior to visit.    Review of Systems Per HPI unless specifically indicated in ROS section     Objective:    BP 132/66 mmHg  Pulse 68  Temp(Src) 98.4 F (36.9 C) (Oral)  Wt 128 lb 8 oz (58.287 kg)  Wt Readings from Last 3 Encounters:  12/23/14 128 lb 8 oz (58.287 kg)  11/28/14 130 lb 4 oz (59.081 kg)  11/11/14 128 lb (58.06 kg)    Physical Exam  Constitutional: She appears well-developed and well-nourished. No distress.  HENT:  Mouth/Throat: No oropharyngeal exudate.  Dry mm  Cardiovascular: Normal rate, regular rhythm and intact distal pulses.   Murmur heard. Pulmonary/Chest: Effort normal and breath sounds normal. No respiratory distress. She has no wheezes.  Crackles bilbasilarly  Musculoskeletal: She exhibits no edema.  Skin: Skin is warm and dry. No rash noted.  Nursing note and vitals reviewed.  Results for orders placed or performed during the hospital encounter  of 11/01/14  I-STAT creatinine  Result Value Ref Range   Creatinine, Ser 0.90 0.44 - 1.00 mg/dL      Assessment & Plan:   Problem List Items Addressed This Visit    Well controlled type 2 diabetes mellitus with nephropathy ([facility]    Chronic, stable. Check A1c off januvia. Continue metformin and lantus 10u daily.      Relevant Orders   Hemoglobin AB5Z  Basic metabolic panel   [place]edge compression fracture of T10 vertebra ([place] [place]    Has declined further evaluation. No current significant back pain. Ongoing arthralgias of shoulders and knees however.      MDD (major depressive disorder), recurrent episode, moderate (HCC)    Somewhat pain related - scheduled tramadol has helped. Ok to continue along with celexa 152mdaily.      CKD (chronic kidney disease) stage  3, GFR 30-59 ml/min    Recheck today.      Chronic hypoxemic respiratory failure (HCC)    Appreciate pulm and cards care of patient. Continue O2.       Chronic diastolic CHF (congestive heart failure) (HCC)    Seems euvolemic. Continue torsemide/potassium dose. check BMP today.          Follow up plan: Return in about 3 months (around 03/25/2015), or as needed, for follow up visit.

## 2014-12-23 NOTE — Assessment & Plan Note (Signed)
Chronic, stable. Check A1c off januvia. Continue metformin and lantus 10u daily.

## 2014-12-27 ENCOUNTER — Other Ambulatory Visit: Payer: Self-pay | Admitting: Family Medicine

## 2014-12-27 MED ORDER — POTASSIUM CHLORIDE CRYS ER 10 MEQ PO TBCR: 20.0000 meq | EXTENDED_RELEASE_TABLET | Freq: Every day | ORAL | Status: DC

## 2014-12-31 ENCOUNTER — Telehealth: Payer: Self-pay

## 2014-12-31 NOTE — Telephone Encounter (Signed)
So no clarification needed for anything?

## 2014-12-31 NOTE — Telephone Encounter (Signed)
PLEASE NOTE: All timestamps contained within this report are represented as [place] Standard Time. CONFIDENTIALTY NOTICE: This fax transmission is intended only for the addressee. It contains information that is legally privileged, confidential or otherwise protected from use or disclosure. If you are not the intended recipient, you are strictly prohibited from reviewing, disclosing, copying using or disseminating any of this information or taking any action in reliance on or regarding this information. If you have received this fax in error, please notify us immediately by telephone so that we can arrange for its return to [place]. Phone: [phone], Toll-Free: [phone], Fax: [phone] Page: 1 of 1 Call Id: [id number] [place] Patient Name: Christine Blanchard Gender: Female DOB: [birth year] Age: 81 Y 91 M 11 D Return Phone Number: Address: City/State/Zip: [place] Client [place] Night - Client Client Site [place] Physician [place], [place] Type Call Call Type Page Only Caller Name Sonia Side w/ [name] Relationship To Patient Provider Is this call to report lab results? No Return Phone Number Please choose phone number Initial Comment Caller states she needs to clarify orders [phone] Nurse Assessment Guidelines Guideline Title Affirmed Question Affirmed Notes Nurse Date/Time ([place] Time) Disp. Time Eilene Ghazi Time) Disposition Final User 12/30/2014 6:01:52 PM Send to [place], Amy 12/30/2014 6:22:05 PM Paged On Call to Other Provider Sherrilyn Rist 12/30/2014 6:22:51 PM Page Completed Yes Sherrilyn Rist After Care Instructions Given Call Event Type User Date / Time Description Paging DoctorName Phone DateTime Result/Outcome Message Type Notes Ronette Deter [id number] 12/30/2014 6:22:05  PM Paged On Call to Other Provider Doctor Paged Joe w/ [facility] pls call [facility] Living @ [phone] Ronette Deter 12/30/2014 6:22:27 PM Paged On Call to the Patient Message Result Text paged Dr Gilford Rile to the caller;

## 2014-12-31 NOTE — Telephone Encounter (Signed)
According to [facility] at [facility] nothing further needed. Thank you.

## 2014-12-31 NOTE — Telephone Encounter (Signed)
Called [facility] and spoke with Jacqlyn Larsen who could not find documentation of this call but also did not know of any issues with clarification of orders.

## 2015-01-15 ENCOUNTER — Telehealth: Payer: Self-pay

## 2015-01-15 NOTE — Telephone Encounter (Signed)
Christine Blanchard with [name] left v/m requesting doctor's order for tylenol arthritis 50 mg taking med at 8 AM, 2PM and 8 PM faxed to [phone].

## 2015-01-16 ENCOUNTER — Encounter: Payer: Self-pay | Admitting: Family Medicine

## 2015-01-16 NOTE — Telephone Encounter (Signed)
I assume tylenol 500mg  TID. Ok to do this order. plz update med list (instead of tylenol 650mg ).

## 2015-01-16 NOTE — Telephone Encounter (Signed)
Order faxed and med list updated 

## 2015-01-20 NOTE — Telephone Encounter (Signed)
plz call to schedule appt for L arm pain

## 2015-01-21 ENCOUNTER — Telehealth: Payer: Self-pay | Admitting: Family Medicine

## 2015-01-21 NOTE — Telephone Encounter (Signed)
Linda from [employer] called to request d/c of order for Tylenol Arthritis PRN. Pt also has order in system for Tylenol 500 TID  cb number is [phone] Thank you

## 2015-01-21 NOTE — Telephone Encounter (Signed)
Appt scheduled

## 2015-01-22 NOTE — Telephone Encounter (Signed)
Spoke with Vaughan Basta. She states that she needs a new order faxed over for patient regular tylenol.

## 2015-01-22 NOTE — Telephone Encounter (Signed)
Ok to cancel tylenol arthritis PRN order.

## 2015-01-23 NOTE — Telephone Encounter (Signed)
Christine Blanchard with [facility] called to ck on status of tylenol order. Advised Dr Darnell Level will be in office on 01/24/15 and Christine Blanchard said that would be OK. Fax order to [phone] request order to read d/c all previous tylenol orders and then write new order for tylenol per Dr Darnell Level.

## 2015-01-24 ENCOUNTER — Ambulatory Visit (INDEPENDENT_AMBULATORY_CARE_PROVIDER_SITE_OTHER): Payer: Medicare Other | Admitting: Family Medicine

## 2015-01-24 ENCOUNTER — Encounter: Payer: Self-pay | Admitting: Family Medicine

## 2015-01-24 VITALS — BP 120/60 | HR 65 | Temp 97.4°F | Wt 130.8 lb

## 2015-01-24 DIAGNOSIS — I25810 Atherosclerosis of coronary artery bypass graft(s) without angina pectoris: Secondary | ICD-10-CM

## 2015-01-24 DIAGNOSIS — M25512 Pain in left shoulder: Secondary | ICD-10-CM | POA: Diagnosis not present

## 2015-01-24 NOTE — Assessment & Plan Note (Signed)
Anticipate combination of frozen shoulder + bursitis/tendinopathy. Xray with known subacromial spur and mild AC arthritis but [place] joint stable (09/2014). Discussed options - rec steroid injection and continue tylenol/tramadol/heating pad. If no improvement consider referral to ortho - but would rec against surgery due to comorbidities. Pt agrees with plan.

## 2015-01-24 NOTE — Patient Instructions (Addendum)
d/c all previous tylenol orders  Start tylenol 500mg  TID with meals. Steroid injection into L shoulder done today. If helpful we can repeat in 3-6 months. Continue tylenol and tramadol, continue heating pad to shoulder 15 min twice daily as tolerated. Let us know if worsening shoulder pain or neck pain despite steroid injection. Watch for fever, red or warm joint and if this happens please seek immediate care.

## 2015-01-24 NOTE — Progress Notes (Signed)
BP 120/60 mmHg  Pulse 65  Temp(Src) 97.4 F (36.3 C) (Oral)  Wt 130 lb 12.8 oz (59.33 kg)  SpO2 97%   CC: L shoulder pain  Subjective:    Patient ID: Christine Blanchard, female    DOB: Apr 04, 1928, 81 y.o.   MRN: [id number]  HPI: Christine Blanchard is a 81 y.o. female presenting on 01/24/2015 for Shoulder Pain   "4 good hours a day in the morning". Pain starts early afternoon. Takes tramadol 4pm and 10pm. Takes tylenol 558m TID.   Ongoing left shoulder pain since 09/2014 previously evaluated and thought consistent with developing frozen shoulder, tendinopathy and DJD. Previously treated with tramadol, tylenol, ice/heating pad. She also has pain that radiates down L arm and intermittent numbness of L arm/hand. She also endorses neck pain.   Known mild degenerative changes of L AC joint with subacromial spur by xray (09/2014). MRI C spine from 09/2006 showing central bulges C3-6 without impingement, spurring R neural foramina C4-7 and L C4/5, and small central subligament disc herniation T2/3. H/o disc fusion C6/7.  Lab Results  Component Value Date   HGBA1C 7.3* 12/23/2014     Relevant past medical, surgical, family and social history reviewed and updated as indicated. Interim medical history since our last visit reviewed. Allergies and medications reviewed and updated. Current Outpatient Prescriptions on File Prior to Visit  Medication Sig  . acetaminophen (TYLENOL) 500 MG tablet Take 500 mg by mouth 3 (three) times daily.  .Marland Kitchenaspirin 81 MG tablet Take 81 mg by mouth daily.   .Marland Kitchenatorvastatin (LIPITOR) 20 MG tablet TAKE 1 TABLET NIGHTLY  . BD AUTOSHIELD DUO 30G X 5 MM MISC Use as instructed with insulin Dx: E11.21  . cetirizine (ZYRTEC ALLERGY) 10 MG tablet Take 1 tablet (10 mg total) by mouth daily.  . citalopram (CELEXA) 10 MG tablet TAKE 1 TABLET DAILY  . clopidogrel (PLAVIX) 75 MG tablet TAKE 1 TABLET DAILY  . dimenhyDRINATE (DRAMAMINE) 50 MG tablet Take 50 mg by mouth at bedtime as  needed.  . diphenhydrAMINE (BENADRYL) 25 mg capsule Take 1 capsule (25 mg total) by mouth 3 (three) times daily as needed for itching.  . docusate sodium (COLACE) 100 MG capsule Take 1 capsule (100 mg total) by mouth daily.  . fenofibrate 54 MG tablet Take 1 tablet (54 mg total) by mouth daily.  . fluticasone (FLONASE) 50 MCG/ACT nasal spray Place 2 sprays into both nostrils daily.  .Marland Kitchenglucose blood (FREESTYLE LITE) test strip Use to check sugar three times daily Dx: 250.40  . glucose monitoring kit (FREESTYLE) monitoring kit 1 each by Does not apply route as needed for other.  . Insulin Glargine (LANTUS SOLOSTAR) 100 UNIT/ML Solostar Pen Inject 10 Units into the skin daily at 10 pm.  . isosorbide mononitrate (IMDUR) 30 MG 24 hr tablet Take 1 tablet (30 mg total) by mouth at bedtime.  . Lancets (FREESTYLE) lancets USE TO CHECK SUGAR THREE TIMES A DAY  . LORazepam (ATIVAN) 1 MG tablet Take 1 tablet (1 mg total) by mouth at bedtime. insomnia  . metFORMIN (GLUCOPHAGE-XR) 500 MG 24 hr tablet Take 1 tablet (500 mg total) by mouth at bedtime.  . metoprolol tartrate (LOPRESSOR) 25 MG tablet TAKE 1 TABLET TWICE A DAY  . Multiple Vitamin (MULTIVITAMIN) capsule Take 1 capsule by mouth daily.    . nitroGLYCERIN (NITROSTAT) 0.4 MG SL tablet DISSOLVE 1 TABLET UNDER THE TONGUE AS NEEDED  . ondansetron (ZOFRAN) 4 MG tablet Take  1 tablet (4 mg total) by mouth daily as needed for nausea or vomiting. 4pm  . potassium chloride (K-DUR,KLOR-CON) 10 MEQ tablet Take 2 tablets (20 mEq total) by mouth daily.  . prednisoLONE sodium phosphate (INFLAMASE FORTE) 1 % ophthalmic solution Place 1 drop into both eyes daily.   . RABEprazole (ACIPHEX) 20 MG tablet TAKE 1 TABLET DAILY  . tiotropium (SPIRIVA HANDIHALER) 18 MCG inhalation capsule Place 1 capsule (18 mcg total) into inhaler and inhale daily.  Marland Kitchen torsemide (DEMADEX) 20 MG tablet Take 2 tablets (40 mg total) by mouth as directed. Extra 78m MWF if needed  . traMADol  (ULTRAM) 50 MG tablet Take 1 tablet (50 mg total) by mouth 2 (two) times daily as needed for moderate pain.  . traZODone (DESYREL) 50 MG tablet Take 0.5 tablets (25 mg total) by mouth at bedtime as needed for sleep.  . Vitamin D, Ergocalciferol, (DRISDOL) 50000 UNITS CAPS capsule TAKE 1 CAPSULE ONCE EVERY WEEK   No current facility-administered medications on file prior to visit.    Review of Systems Per HPI unless specifically indicated in ROS section     Objective:    BP 120/60 mmHg  Pulse 65  Temp(Src) 97.4 F (36.3 C) (Oral)  Wt 130 lb 12.8 oz (59.33 kg)  SpO2 97%  Wt Readings from Last 3 Encounters:  01/24/15 130 lb 12.8 oz (59.33 kg)  12/23/14 128 lb 8 oz (58.287 kg)  11/28/14 130 lb 4 oz (59.081 kg)    Physical Exam  Constitutional: She appears well-developed and well-nourished. No distress.  Musculoskeletal: She exhibits no edema.  No significant midline cervical or paracervical neck pain FROM at neck. Neg spurling bilaterally R shoulder WNL L shoulder exam: No deformity of shoulders on inspection Mild pain with palpation of shoulder joint Limited ROM in abduction and forward flexion past 90 degrees + pain/weakness with testing SITS in ext/int rotation. + pain with empty can sign. + Speed test. + impingement. Mild pain with rotation of humeral head in [facility].   Skin: Skin is warm and dry.  Nursing note and vitals reviewed.  Results for orders placed or performed in visit on 12/23/14  Hemoglobin A1c  Result Value Ref Range   Hgb A1c MFr Bld 7.3 (H) 4.6 - 6.5 %  Basic metabolic panel  Result Value Ref Range   Sodium 138 135 - 145 mEq/L   Potassium 3.1 (L) 3.5 - 5.1 mEq/L   Chloride 93 (L) 96 - 112 mEq/L   CO2 37 (H) 19 - 32 mEq/L   Glucose, Bld 116 (H) 70 - 99 mg/dL   BUN 15 6 - 23 mg/dL   Creatinine, Ser 0.73 0.40 - 1.20 mg/dL   Calcium 9.6 8.4 - 10.5 mg/dL   GFR 80.61 >60.00 mL/min   LEFT SHOULDER - 2+ VIEW COMPARISON: Chest x-ray of September 05, 2014 which included portions of the left shoulder.  FINDINGS: The bones are reasonably well mineralized for age. The glenohumeral joint is normal. There are mild degenerative changes of the [facility] Center[facility]. The subacromial subdeltoid space is preserved but there is a subacromial spur. The observed portions of the left clavicle and upper left ribs are normal.  IMPRESSION: There is subacromial spur and there are mild degenerative changes of the AL[facility] Surgery Centerjoint. The glenohumeral joint is unremarkable.  Electronically Signed  By: David JMartiniqueM.D.  On: 09/20/2014 08:15  L shoulder steroid injection:  IC obtained and in chart.  Landmarks palpated and area cleaned with EtOH  wipe. Ethyl chloride for numbing. Using posterior lateral approach, steroid injection today - 1cc depo medrol (12m), 4cc 1% lidocaine using 22g 1.5in needle.  Pt tolerated well.     Assessment & Plan:   Problem List Items Addressed This Visit    Left shoulder pain - Primary    Anticipate combination of frozen shoulder + bursitis/tendinopathy. Xray with known subacromial spur and mild AC arthritis but [place] stable (09/2014). Discussed options - rec steroid injection and continue tylenol/tramadol/heating pad. If no improvement consider referral to ortho - but would rec against surgery due to comorbidities. Pt agrees with plan.          Follow up plan: Return if symptoms worsen or fail to improve.

## 2015-01-24 NOTE — Telephone Encounter (Signed)
Pt seen today. Orders written on hard copy of pt orders she brings.

## 2015-02-04 ENCOUNTER — Other Ambulatory Visit: Payer: Self-pay | Admitting: *Deleted

## 2015-02-04 ENCOUNTER — Telehealth: Payer: Self-pay | Admitting: *Deleted

## 2015-02-04 MED ORDER — POTASSIUM CHLORIDE CRYS ER 10 MEQ PO TBCR: 20.0000 meq | EXTENDED_RELEASE_TABLET | Freq: Every day | ORAL | Status: DC

## 2015-02-04 NOTE — Telephone Encounter (Signed)
PLEASE NOTE: All timestamps contained within this report are represented as [place] Standard Time. CONFIDENTIALTY NOTICE: This fax transmission is intended only for the addressee. It contains information that is legally privileged, confidential or otherwise protected from use or disclosure. If you are not the intended recipient, you are strictly prohibited from reviewing, disclosing, copying using or disseminating any of this information or taking any action in reliance on or regarding this information. If you have received this fax in error, please notify us immediately by telephone so that we can arrange for its return to [place]. Phone: [phone], Toll-Free: [phone], Fax: [phone] Page: 1 of 1 Call Id: [id number] [place] Patient Name: Christine Blanchard Gender: Female DOB: [birth year] Age: 82 Y 40 M 1 D Return Phone Number: Address: City/State/Zip: [place] Client [place] Night - Client Client Site [place] Physician Ria Bush Contact Type Call Call Type Page Only Caller Name Harrington Challenger Relationship To Patient Provider Is this call to report lab results? No Return Phone Number Please choose phone number Initial Comment Harrington Challenger with [facility] at [phone]. Out of Tramadol, pt at assisted living. Nurse Assessment Guidelines Guideline Title Affirmed Question Affirmed Notes Nurse Date/Time (Eastern Time) Disp. Time Eilene Ghazi Time) Disposition Final User 02/01/2015 5:11:26 PM Send to [place], [place] 02/01/2015 5:15:03 PM Paged On Call to Other Provider Honor Loh 02/01/2015 5:15:18 PM Page Completed Yes Honor Loh After Care Instructions Given Call Event Type User Date / Time Description Paging DoctorName Phone DateTime Result/Outcome Message Type Notes Ria Bush [id number] 02/01/2015 5:15:03  PM Paged On Call to Other Provider Doctor Paged Please call Harrington Challenger with [facility] at [phone] regarding a page. If you need more information, please call Quillian Quince with [employer] at [phone] Ria Bush 02/01/2015 5:15:11 PM Paged On Call to Another Provider Message Result

## 2015-02-04 NOTE — Telephone Encounter (Signed)
New sig: 98mEq take 2 tablets PO daily

## 2015-02-04 NOTE — Telephone Encounter (Signed)
Ok to refill? Will need written scripts to fax to mail order.

## 2015-02-04 NOTE — Telephone Encounter (Signed)
Linda left a voicemail requesting medication clarification on Klor-con. Vaughan Basta stated that they received medication from the mail order pharmacy and the directions show take 1/2 daily, except take a whole one the days that she takes Torsemide. Vaughan Basta stated that their [facility] shows that patient is to take it daily. These directions do no match medication list. Please advise,

## 2015-02-05 NOTE — Telephone Encounter (Signed)
Linda notified. 

## 2015-02-06 MED ORDER — TRAMADOL HCL 50 MG PO TABS: 50.0000 mg | ORAL_TABLET | Freq: Two times a day (BID) | ORAL | Status: DC | PRN

## 2015-02-06 MED ORDER — LORAZEPAM 1 MG PO TABS: 1.0000 mg | ORAL_TABLET | Freq: Every day | ORAL | Status: DC

## 2015-02-06 NOTE — Telephone Encounter (Signed)
Printed and in Kim's box 

## 2015-02-06 NOTE — Telephone Encounter (Signed)
Rx's faxed to mail order.  

## 2015-02-14 ENCOUNTER — Telehealth: Payer: Self-pay

## 2015-02-14 ENCOUNTER — Other Ambulatory Visit: Payer: Self-pay | Admitting: Family Medicine

## 2015-02-14 NOTE — Telephone Encounter (Signed)
Message left advising Linda/Kristin.

## 2015-02-14 NOTE — Telephone Encounter (Signed)
Erasmo Downer with [name] left v/m requesting cb; pt advised Erasmo Downer she is to take Vit D weekly; do you want pt to continue Vit D weekly. Kristin request cb to verify orders.

## 2015-02-14 NOTE — Telephone Encounter (Signed)
Continue weekly 50,000 IU for now. Will recheck vit D level next labwork and decide on treatment.

## 2015-02-15 ENCOUNTER — Encounter: Payer: Self-pay | Admitting: Family Medicine

## 2015-02-17 MED ORDER — TRAMADOL HCL 50 MG PO TABS: 50.0000 mg | ORAL_TABLET | Freq: Two times a day (BID) | ORAL | Status: DC

## 2015-02-17 NOTE — Telephone Encounter (Signed)
plz schedule tramadol at 4pm and 10pm. Printed new script and in Kim's box.

## 2015-02-18 NOTE — Telephone Encounter (Signed)
Ross at [facility] called regarding this prescription.  Does the scheduled tramadol replace the prescription for the prn tramadol?  If so, an order is needed to discontinue the previous order for prn tramadol.  Please call Ross at [phone] to discuss.

## 2015-02-18 NOTE — Telephone Encounter (Signed)
Order and Rx faxed to [facility].

## 2015-02-20 NOTE — Telephone Encounter (Signed)
Yes please discontinue prior tramadol order. Thanks.

## 2015-02-20 NOTE — Telephone Encounter (Signed)
Order faxed to [facility].

## 2015-02-24 ENCOUNTER — Other Ambulatory Visit: Payer: Self-pay | Admitting: *Deleted

## 2015-02-24 MED ORDER — ONDANSETRON HCL 4 MG PO TABS: 4.0000 mg | ORAL_TABLET | Freq: Every day | ORAL | Status: DC | PRN

## 2015-02-24 NOTE — Telephone Encounter (Signed)
Ok to refill 

## 2015-02-27 DIAGNOSIS — D485 Neoplasm of uncertain behavior of skin: Secondary | ICD-10-CM | POA: Diagnosis not present

## 2015-02-27 DIAGNOSIS — L821 Other seborrheic keratosis: Secondary | ICD-10-CM | POA: Diagnosis not present

## 2015-02-27 DIAGNOSIS — L578 Other skin changes due to chronic exposure to nonionizing radiation: Secondary | ICD-10-CM | POA: Diagnosis not present

## 2015-02-27 DIAGNOSIS — D229 Melanocytic nevi, unspecified: Secondary | ICD-10-CM | POA: Diagnosis not present

## 2015-02-27 DIAGNOSIS — Z85828 Personal history of other malignant neoplasm of skin: Secondary | ICD-10-CM | POA: Diagnosis not present

## 2015-02-27 DIAGNOSIS — L820 Inflamed seborrheic keratosis: Secondary | ICD-10-CM | POA: Diagnosis not present

## 2015-02-27 DIAGNOSIS — L570 Actinic keratosis: Secondary | ICD-10-CM | POA: Diagnosis not present

## 2015-02-27 DIAGNOSIS — C44622 Squamous cell carcinoma of skin of right upper limb, including shoulder: Secondary | ICD-10-CM | POA: Diagnosis not present

## 2015-02-27 DIAGNOSIS — L812 Freckles: Secondary | ICD-10-CM | POA: Diagnosis not present

## 2015-03-04 DIAGNOSIS — Z961 Presence of intraocular lens: Secondary | ICD-10-CM | POA: Diagnosis not present

## 2015-03-04 LAB — HM DIABETES EYE EXAM

## 2015-03-05 DIAGNOSIS — C44621 Squamous cell carcinoma of skin of unspecified upper limb, including shoulder: Secondary | ICD-10-CM

## 2015-03-05 HISTORY — DX: Squamous cell carcinoma of skin of unspecified upper limb, including shoulder: C44.621

## 2015-03-05 LAB — HM DIABETES EYE EXAM

## 2015-03-06 ENCOUNTER — Encounter: Payer: Self-pay | Admitting: Family Medicine

## 2015-03-07 ENCOUNTER — Encounter: Payer: Self-pay | Admitting: Family Medicine

## 2015-03-10 ENCOUNTER — Telehealth: Payer: Self-pay

## 2015-03-10 NOTE — Telephone Encounter (Signed)
Spoke with Elmyra Ricks. She states after speaking with her that pt will need a face to face OV to be qualified for O2. States she will contact the pt and inform her. Nothing further needed.

## 2015-03-10 NOTE — Telephone Encounter (Signed)
Would like office notes sent over, pt is requesting lighter portable oxygen concentrator. States she faxed a request over last Thursday 2/2.

## 2015-03-14 ENCOUNTER — Ambulatory Visit (INDEPENDENT_AMBULATORY_CARE_PROVIDER_SITE_OTHER): Payer: Medicare Other | Admitting: Internal Medicine

## 2015-03-14 ENCOUNTER — Encounter: Payer: Self-pay | Admitting: Internal Medicine

## 2015-03-14 VITALS — BP 126/62 | HR 72 | Ht 64.0 in | Wt 134.8 lb

## 2015-03-14 DIAGNOSIS — R911 Solitary pulmonary nodule: Secondary | ICD-10-CM | POA: Diagnosis not present

## 2015-03-14 DIAGNOSIS — I5032 Chronic diastolic (congestive) heart failure: Secondary | ICD-10-CM | POA: Diagnosis not present

## 2015-03-14 DIAGNOSIS — R0602 Shortness of breath: Secondary | ICD-10-CM

## 2015-03-14 NOTE — Progress Notes (Addendum)
* [name]     Assessment and Plan:  Dyspnea -Multifactorial causes of dyspnea including restrictive and obstructive lung disease, chronic angina, deconditioning and likely some degree of chronic aspiration. 06/2011 6MW 725 ft., O2 98% 2L [place] HR peack 90  Chronic hypoxemic respiratory failure --Continuous O2 at 2L/min. --Suspect that this is multifactorial due to combined pulmonary disease, cardiac disease, debility and deconditioning.   --Continue activity as tolerated.  --Recheck desat walk today to re-qualify for oxygen.   Congestive heart failure --Chronic congestive heart failure and volume overload with CAD, she is following with cardiology.  --Continue current cardiac regimen and diuretic.   Bronchiectasis. Emphysema. -CT chest from 11/01/2014 shows some mild subpleural fibrosis, bronchiectasis, and anatomical emphysema. Continue oxygen and activity.  --Continue spiriva.   Rhinitis -Continue flonase.    She reinterates today that her advanced directive states DO NOT RESUSCITATE.    SATURATION QUALIFICATIONS: (This note is used to comply with regulatory documentation for home oxygen)  Patient Saturations on Room Air at Rest = 97%  Patient Saturations on Room Air while Ambulating = 84%  Patient Saturations on 2 Liters of oxygen while Ambulating = 93%  Please briefly explain why patient needs home oxygen:desats while ambulating.  Date: 03/14/2015  MRN# [id number] Christine Blanchard [birth year]   Christine Blanchard is a 82 y.o. old female seen in follow up for chief complaint of  Chief Complaint  Patient presents with  . Follow-up    pt. states breathing is baseline. occ. SOB. occ. dry cough. occ. chest pain/tightness. on 2L 02     HPI:  Christine Blanchard is an 82 year old female with chronic dyspnea. She has both obstructive and restrictive lung disease and possibly some degree of chronic aspiration. Her course is also complicated by debility and  deconditioning, she lives in a nursing home. She has been on 2 L of oxygen, she ambulates with a walker. She lives in assisted living.  She notes today that her breathing is about the same. She has to sit when walking to the dining area. She continues on 2 L of oxygen.    CT images and report from 11/01/2014 reviewed: No lung nodule was seen, there is early changes of pulmonary fibrosis seen in the subpleural areas, there is some of the symptoms, changes, as well as a areas of bronchiectasis. There is also left elevated diaphragm visible.  SIX MIN WALK 10/17/2013 04/25/2013 10/17/2012 11/03/2011 06/15/2010  Supplimental Oxygen during Test? (L/min) Yes Yes Yes Yes No  O2 Flow Rate 2 2 2 2  -  Type Continuous Continuous Pulse Pulse -  Tech Comments: - Could not complete lap 3 due to leg weakness. - After walking approx 200 ft o2 sat was 93%2lpm and pulse was 80/walk stopped due to weakness//lmr pt recovered to 93% 2L with exertion    Medication:   Outpatient Encounter Prescriptions as of 03/14/2015  Medication Sig  . acetaminophen (TYLENOL) 500 MG tablet Take 500 mg by mouth 3 (three) times daily.  Marland Kitchen aspirin 81 MG tablet Take 81 mg by mouth daily.   Marland Kitchen atorvastatin (LIPITOR) 20 MG tablet TAKE 1 TABLET NIGHTLY  . BD AUTOSHIELD DUO 30G X 5 MM MISC Use as instructed with insulin Dx: E11.21  . cetirizine (ZYRTEC ALLERGY) 10 MG tablet Take 1 tablet (10 mg total) by mouth daily.  . citalopram (CELEXA) 10 MG tablet TAKE 1 TABLET DAILY  . clopidogrel (PLAVIX) 75 MG tablet TAKE 1 TABLET DAILY  . dimenhyDRINATE (DRAMAMINE)  50 MG tablet Take 50 mg by mouth at bedtime as needed.  . diphenhydrAMINE (BENADRYL) 25 mg capsule Take 1 capsule (25 mg total) by mouth 3 (three) times daily as needed for itching.  . docusate sodium (COLACE) 100 MG capsule Take 1 capsule (100 mg total) by mouth daily.  . fenofibrate 54 MG tablet Take 1 tablet (54 mg total) by mouth daily.  . fluticasone (FLONASE) 50 MCG/ACT nasal spray  Place 2 sprays into both nostrils daily.  Marland Kitchen glucose blood (FREESTYLE LITE) test strip Use to check sugar three times daily Dx: 250.40  . glucose monitoring kit (FREESTYLE) monitoring kit 1 each by Does not apply route as needed for other.  . Insulin Glargine (LANTUS SOLOSTAR) 100 UNIT/ML Solostar Pen Inject 10 Units into the skin daily at 10 pm.  . isosorbide mononitrate (IMDUR) 30 MG 24 hr tablet Take 1 tablet (30 mg total) by mouth at bedtime.  . Lancets (FREESTYLE) lancets USE TO CHECK SUGAR THREE TIMES A DAY  . LORazepam (ATIVAN) 1 MG tablet Take 1 tablet (1 mg total) by mouth at bedtime. insomnia  . metFORMIN (GLUCOPHAGE-XR) 500 MG 24 hr tablet Take 1 tablet (500 mg total) by mouth at bedtime.  . metoprolol tartrate (LOPRESSOR) 25 MG tablet TAKE 1 TABLET TWICE A DAY  . Multiple Vitamin (MULTIVITAMIN) capsule Take 1 capsule by mouth daily.    . nitroGLYCERIN (NITROSTAT) 0.4 MG SL tablet DISSOLVE 1 TABLET UNDER THE TONGUE AS NEEDED  . ondansetron (ZOFRAN) 4 MG tablet Take 1 tablet (4 mg total) by mouth daily as needed for nausea or vomiting. 4pm  . potassium chloride (K-DUR,KLOR-CON) 10 MEQ tablet Take 2 tablets (20 mEq total) by mouth daily.  . prednisoLONE sodium phosphate (INFLAMASE FORTE) 1 % ophthalmic solution Place 1 drop into both eyes daily.   . RABEprazole (ACIPHEX) 20 MG tablet TAKE 1 TABLET DAILY  . tiotropium (SPIRIVA HANDIHALER) 18 MCG inhalation capsule Place 1 capsule (18 mcg total) into inhaler and inhale daily.  Marland Kitchen torsemide (DEMADEX) 20 MG tablet Take 2 tablets (40 mg total) by mouth as directed. Extra 9m MWF if needed  . traMADol (ULTRAM) 50 MG tablet Take 1 tablet (50 mg total) by mouth 2 (two) times daily. At 4pm and 10pm  . traZODone (DESYREL) 50 MG tablet TAKE ONE-HALF (1/2) TO 1 TABLET (25 TO 50 MG TOTAL) AT BEDTIME AS NEEDED FOR SLEEP  . Vitamin D, Ergocalciferol, (DRISDOL) 50000 UNITS CAPS capsule TAKE 1 CAPSULE ONCE EVERY WEEK   No facility-administered encounter  medications on file as of 03/14/2015.     Allergies:  Actos; Pioglitazone; Atenolol; Codeine; Eszopiclone; Eszopiclone; Exenatide; Morphine; Sulfa antibiotics; Sulfacetamide sodium; Vicodin; Exenatide; and Hydrocodone-acetaminophen  Review of Systems: Gen:  Denies  fever, sweats. HEENT: Denies blurred vision. Cvc:  No dizziness, chest pain or heaviness Resp:   Denies cough or sputum porduction. Gi: Denies swallowing difficulty, stomach pain. constipation, bowel incontinence Gu:  Denies bladder incontinence, burning urine Ext:   No Joint pain, stiffness. Skin: No skin rash, easy bruising. Endoc:  No polyuria, polydipsia. Psych: No depression, insomnia. Other:  All other systems were reviewed and found to be negative other than what is mentioned in the HPI.   Physical Examination:   VS: BP 126/62 mmHg  Pulse 72  Ht 5' 4"  (1.626 m)  Wt 134 lb 12.8 oz (61.145 kg)  BMI 23.13 kg/m2  SpO2 96%  General Appearance: No distress  Neuro:without focal findings,  speech normal,  HEENT: PERRLA, EOM  intact. Pulmonary: normal breath sounds, bibasilar crackles.  CardiovascularNormal S1,S2.  No m/r/g.   Abdomen: Benign, Soft, non-tender. Renal:  No costovertebral tenderness  GU:  Not performed at this time. Endoc: No evident thyromegaly, no signs of acromegaly. Skin:   warm, no rash. Extremities: normal, no cyanosis, clubbing.   LABORATORY PANEL:   CBC No results for input(s): WBC, HGB, HCT, PLT in the last 168 hours. ------------------------------------------------------------------------------------------------------------------  Chemistries  No results for input(s): NA, K, CL, CO2, GLUCOSE, BUN, CREATININE, CALCIUM, MG, AST, ALT, ALKPHOS, BILITOT in the last 168 hours.  Invalid input(s): GFRCGP ------------------------------------------------------------------------------------------------------------------  Cardiac Enzymes No results for input(s): TROPONINI in the last 168  hours. ------------------------------------------------------------  RADIOLOGY:   No results found for this or any previous visit. Results for orders placed during the hospital encounter of 09/05/14  DG Chest 2 View   Narrative CLINICAL DATA:  Increase shortness of breath for 3-6 months was congestive heart failure, nonsmoker, history of coronary artery disease  EXAM: CHEST  2 VIEW  COMPARISON:  03/13/2014  FINDINGS: Status post prior CABG. Heart size and vascular pattern normal. Stable aortic calcification. Mild central bronchitic change and interstitial prominence appears stable. Lower thoracic spine mild compression deformity stable from 05/15/2013. Possible 1.5 cm pulmonary nodule in the retrocardiac area.  IMPRESSION: Stable mild diffuse bronchitic and interstitial change when compared to prior studies.  Possible pulmonary nodule for which CT thorax is recommended.  These results will be called to the ordering clinician or representative by the Radiologist Assistant, and communication documented in the PACS or zVision Dashboard.   Electronically Signed   By: Skipper Cliche M.D.   On: 09/05/2014 11:32    ------------------------------------------------------------------------------------------------------------------  Thank  you for allowing [facility] to assist in the care of your patient. Our recommendations are noted above.  Please contact us if we can be of further service.   Marda Stalker, MD.  [facility]  Patricia Pesa, M.D.  Vilinda Boehringer, M.D.  Merton Border, M.D

## 2015-03-14 NOTE — Patient Instructions (Addendum)
--  Will recheck oxygen to re-qualify.  --continue spiriva.

## 2015-03-18 ENCOUNTER — Telehealth: Payer: Self-pay | Admitting: Internal Medicine

## 2015-03-18 NOTE — Telephone Encounter (Signed)
[facility] calling stating we sent order for stationary portable oxygen  They need Ov notes from 03/14/15  and duration and length of need for patient to get this. Please call back.

## 2015-03-18 NOTE — Telephone Encounter (Signed)
Faxed to: [phone]

## 2015-03-18 NOTE — Telephone Encounter (Signed)
OV printed and will be refaxed with paper received from [facility].

## 2015-03-19 ENCOUNTER — Telehealth: Payer: Self-pay | Admitting: Internal Medicine

## 2015-03-19 NOTE — Telephone Encounter (Signed)
On your OV note from 03/14/15, [facility] needs for you to make an addendum to the note stating below. Once done please let me know so that I can refax the OV note to them. Thanks.    SATURATION QUALIFICATIONS: (This note is used to comply with regulatory documentation for home oxygen)  Patient Saturations on Room Air at Rest = 97%  Patient Saturations on Room Air while Ambulating = 84%  Patient Saturations on 2 Liters of oxygen while Ambulating = 93%  Please briefly explain why patient needs home oxygen:desats while ambulating.

## 2015-03-19 NOTE — Telephone Encounter (Signed)
[facility] calling also needing recent tests she did prior to the 10  Please fax to [phone] She states she had the notes but some showed and talked about a test that was done she needs those also faxed over please

## 2015-03-24 ENCOUNTER — Telehealth: Payer: Self-pay

## 2015-03-24 NOTE — Telephone Encounter (Signed)
Order faxed to [facility] and patient notified.

## 2015-03-24 NOTE — Telephone Encounter (Signed)
PLEASE NOTE: All timestamps contained within this report are represented as [place] Standard Time. CONFIDENTIALTY NOTICE: This fax transmission is intended only for the addressee. It contains information that is legally privileged, confidential or otherwise protected from use or disclosure. If you are not the intended recipient, you are strictly prohibited from reviewing, disclosing, copying using or disseminating any of this information or taking any action in reliance on or regarding this information. If you have received this fax in error, please notify us immediately by telephone so that we can arrange for its return to [place]. Phone: [phone], Toll-Free: [phone], Fax: [phone] Page: 1 of 2 Call Id: [id number] [place] Patient Name: Christine Blanchard Gender: Female DOB: [birth year] Age: 82 Y 10 M 2 D Return Phone Number: [phone] (Primary) Address: City/State/Zip: [place] Client [place] [facility] - Client Client Site [place] Physician Ria Bush Contact Type Call Call Type Triage / Clinical Relationship To Patient Self Return Phone Number [phone] (Primary) Chief Complaint Paging or Request for Consult Initial Comment Caller states she has a fever blister - she is in an assisted living facility [facility]) and needs the on call DR to call her facility and give consent for her to put abreva on her blister. Translation No No Triage Reason Patient declined Nurse Assessment Nurse: Malva Cogan, RN, Juliann Pulse Date/Time Eilene Ghazi Time): 03/23/2015 10:17:56 AM Confirm and document reason for call. If symptomatic, describe symptoms. You must click the next button to save text entered. ---Caller states that she has a fever blister & is in an assisted living facility & facility is requiring that the physician provide an order for  caller to use OTC Abreva. Offered to triage pt's symptoms but caller passed that phone off to [place] who is the med administerer & Margreta Journey advised that because med has to be administered by a HCP that facility needs to call for order but also that directives in profile indicate that on call providers are not to be called for med orders after hrs. Again asked if caller wants her symptoms addressed but Margreta Journey states that facility will just call office tomorrow to obtain order. Has the patient traveled out of the country within the last 30 days? ---No Does the patient have any new or worsening symptoms? ---Yes Will a triage be completed? ---No Select reason for no triage. ---Patient declined Please document clinical information provided and list any resource used. ---D/W charge nurse Almyra Free prior to making call & Almyra Free states that if an order from physician is required before OTC med can be administered that provider from facility would have to call. Guidelines Guideline Title Affirmed Question Affirmed Notes Nurse Date/Time (Eastern Time) Disp. Time Eilene Ghazi Time) Disposition Final User 03/23/2015 10:17:37 AM Clinical Call Yes Malva Cogan, RN, Juliann Pulse PLEASE NOTE: All timestamps contained within this report are represented as [place] Standard Time. CONFIDENTIALTY NOTICE: This fax transmission is intended only for the addressee. It contains information that is legally privileged, confidential or otherwise protected from use or disclosure. If you are not the intended recipient, you are strictly prohibited from reviewing, disclosing, copying using or disseminating any of this information or taking any action in reliance on or regarding this information. If you have received this fax in error, please notify us immediately by telephone so that we can arrange for its return to [place]. Phone: [phone], Toll-Free: [phone], Fax: [phone] Page: 2 of 2 Call  Id: [id number]

## 2015-03-24 NOTE — Telephone Encounter (Signed)
Spoke to [facility] at [facility].  They are unable to take verbal orders and will need a written order faxed to [phone].

## 2015-03-24 NOTE — Telephone Encounter (Signed)
Sherolyn Buba called to verify instructions for abreva; AAA 4-5 times daily until resolved. Jerri voiced understanding.

## 2015-03-24 NOTE — Telephone Encounter (Signed)
Plz call - ok to start abreva for fever blister. Apply to affected area 4-5 times daily until resolved. If not improving update [place].

## 2015-03-24 NOTE — Telephone Encounter (Signed)
Rx written and handed to Christine Blanchard.

## 2015-03-25 ENCOUNTER — Telehealth: Payer: Self-pay

## 2015-03-25 NOTE — Telephone Encounter (Signed)
PLEASE NOTE: All timestamps contained within this report are represented as [place] Standard Time. CONFIDENTIALTY NOTICE: This fax transmission is intended only for the addressee. It contains information that is legally privileged, confidential or otherwise protected from use or disclosure. If you are not the intended recipient, you are strictly prohibited from reviewing, disclosing, copying using or disseminating any of this information or taking any action in reliance on or regarding this information. If you have received this fax in error, please notify us immediately by telephone so that we can arrange for its return to [place]. Phone: [phone], Toll-Free: [phone], Fax: [phone] Page: 1 of 1 Call Id: [id number] [place] [facility] - Client Nonclinical Telephone Record [place] [facility] - Client Client Site [place] Physician Ria Bush Contact Type Call Who Is Calling Physician / Provider / Hospital Call Type Provider Call [facility] Reason for Call Request for Orders Initial Comment [facility] is calling requesting clarification on orders; Additional Comment Patient Name Christine Blanchard Patient DOB [birth year] Requesting Provider Orlando Penner Physician Number [place] [facility] Paging DoctorName Phone DateTime Result/Outcome Message Type Notes Simonne Martinet [id number] 03/24/2015 11:09:27 PM Called On Call Provider - Reached Doctor Paged Simonne Martinet 03/24/2015 11:09:31 PM Spoke with On Call - General Message Result Call Closed By: Gae Gallop Transaction Date/Time: 03/24/2015 11:04:09 PM (ET)

## 2015-03-27 ENCOUNTER — Ambulatory Visit (INDEPENDENT_AMBULATORY_CARE_PROVIDER_SITE_OTHER): Payer: Medicare Other | Admitting: Family Medicine

## 2015-03-27 ENCOUNTER — Encounter: Payer: Self-pay | Admitting: Family Medicine

## 2015-03-27 VITALS — BP 126/58 | HR 65 | Temp 98.2°F | Wt 132.8 lb

## 2015-03-27 DIAGNOSIS — R012 Other cardiac sounds: Secondary | ICD-10-CM

## 2015-03-27 DIAGNOSIS — J9611 Chronic respiratory failure with hypoxia: Secondary | ICD-10-CM

## 2015-03-27 DIAGNOSIS — I5032 Chronic diastolic (congestive) heart failure: Secondary | ICD-10-CM

## 2015-03-27 DIAGNOSIS — I25119 Atherosclerotic heart disease of native coronary artery with unspecified angina pectoris: Secondary | ICD-10-CM | POA: Diagnosis not present

## 2015-03-27 DIAGNOSIS — E1121 Type 2 diabetes mellitus with diabetic nephropathy: Secondary | ICD-10-CM | POA: Diagnosis not present

## 2015-03-27 LAB — BASIC METABOLIC PANEL
BUN: 13 mg/dL (ref 6–23)
CALCIUM: 9.8 mg/dL (ref 8.4–10.5)
CHLORIDE: 95 meq/L — AB (ref 96–112)
CO2: 39 meq/L — AB (ref 19–32)
CREATININE: 0.75 mg/dL (ref 0.40–1.20)
GFR: 78.09 mL/min (ref >60.00)
GLUCOSE: 126 mg/dL — AB (ref 70–99)
Potassium: 3.9 mEq/L (ref 3.5–5.1)
Sodium: 139 mEq/L (ref 135–145)

## 2015-03-27 LAB — HEMOGLOBIN A1C: Hgb A1c MFr Bld: 7.8 % — ABNORMAL HIGH (ref 4.6–6.5)

## 2015-03-27 NOTE — Telephone Encounter (Signed)
OV faxed

## 2015-03-27 NOTE — Assessment & Plan Note (Signed)
Seems euvolemic. Continue torsemide 40mg  daily with 20mg  extra PM dose MWF. Continue KDur 52mEq daily.

## 2015-03-27 NOTE — Assessment & Plan Note (Signed)
Check A1c and titrate metformin accordingly.

## 2015-03-27 NOTE — Patient Instructions (Addendum)
EKG looking ok today. I will update echocardiogram. Labwork today - we will contact you with results and med changes as needed.  Good to see you today, call us with questions.

## 2015-03-27 NOTE — Progress Notes (Signed)
Pre visit review using our clinic review tool, if applicable. No additional management support is needed unless otherwise documented below in the visit note. 

## 2015-03-27 NOTE — Progress Notes (Signed)
BP 126/58 mmHg  Pulse 65  Temp(Src) 98.2 F (36.8 C) (Oral)  Wt 132 lb 12 oz (60.215 kg)  SpO2 95% on 2L by [place]  CC: 3 mo f/u visit  Subjective:    Patient ID: Christine Blanchard, female    DOB: 1928-08-06, 82 y.o.   MRN: [id number]  HPI: Christine Blanchard is a 82 y.o. female presenting on 03/27/2015 for Follow-up   Christine Blanchard is a pleasant 82 yo with h/o HTN, DM, h/o CVA after cath, severe CAD s/p multiple interventions now planned medical management, diastolic CHF and chronic hypoxemic respiratory failure with both obstructive and restrictive components and respiratory muscle weakness who is oxygen dependent. She presents today for 3 mo f/u visit.   Chronic hypoxemic resp failure - multifactorial. Continue 2L/min O2 by [place]. On spiriva. Saw Dr Ashby Dawes pulmonology who recommended pulm rehab - pt has decided not to pursue this. Continues oxygen regularly - has been on since 2012.  CAD and chronic diastolic CHF - sees Dr Rockey Situ. On PM torsemide PRN a few days a week.Off ranexa and doing well. Medical management only. High risk for stroke with any planned intervention.   Controlled T2DM - A1c 6.2->7.3% off [place]. Compliant with meds, denies low sugars. We stopped [place] last visit in an effort to continue to simplify her medical regimen. Lab Results  Component Value Date   HGBA1C 7.3* 12/23/2014     Relevant past medical, surgical, family and social history reviewed and updated as indicated. Interim medical history since our last visit reviewed. Allergies and medications reviewed and updated. Current Outpatient Prescriptions on File Prior to Visit  Medication Sig  . acetaminophen (TYLENOL) 500 MG tablet Take 500 mg by mouth 3 (three) times daily.  Marland Kitchen aspirin 81 MG tablet Take 81 mg by mouth daily.   Marland Kitchen atorvastatin (LIPITOR) 20 MG tablet TAKE 1 TABLET NIGHTLY  . BD AUTOSHIELD DUO 30G X 5 MM MISC Use as instructed with insulin Dx: E11.21  . cetirizine (ZYRTEC ALLERGY) 10 MG tablet Take  1 tablet (10 mg total) by mouth daily.  . citalopram (CELEXA) 10 MG tablet TAKE 1 TABLET DAILY  . clopidogrel (PLAVIX) 75 MG tablet TAKE 1 TABLET DAILY  . dimenhyDRINATE (DRAMAMINE) 50 MG tablet Take 50 mg by mouth at bedtime as needed.  . diphenhydrAMINE (BENADRYL) 25 mg capsule Take 1 capsule (25 mg total) by mouth 3 (three) times daily as needed for itching.  . Docosanol (ABREVA) 10 % CREA AAA 4-5 times daily until resolved  . docusate sodium (COLACE) 100 MG capsule Take 1 capsule (100 mg total) by mouth daily.  . fenofibrate 54 MG tablet Take 1 tablet (54 mg total) by mouth daily.  . fluticasone (FLONASE) 50 MCG/ACT nasal spray Place 2 sprays into both nostrils daily.  Marland Kitchen glucose blood (FREESTYLE LITE) test strip Use to check sugar three times daily Dx: 250.40  . glucose monitoring kit (FREESTYLE) monitoring kit 1 each by Does not apply route as needed for other.  . Insulin Glargine (LANTUS SOLOSTAR) 100 UNIT/ML Solostar Pen Inject 10 Units into the skin daily at 10 pm.  . isosorbide mononitrate (IMDUR) 30 MG 24 hr tablet Take 1 tablet (30 mg total) by mouth at bedtime.  . Lancets (FREESTYLE) lancets USE TO CHECK SUGAR THREE TIMES A DAY  . LORazepam (ATIVAN) 1 MG tablet Take 1 tablet (1 mg total) by mouth at bedtime. insomnia  . metFORMIN (GLUCOPHAGE-XR) 500 MG 24 hr tablet Take 1 tablet (500  mg total) by mouth at bedtime.  . metoprolol tartrate (LOPRESSOR) 25 MG tablet TAKE 1 TABLET TWICE A DAY  . Multiple Vitamin (MULTIVITAMIN) capsule Take 1 capsule by mouth daily.    . nitroGLYCERIN (NITROSTAT) 0.4 MG SL tablet DISSOLVE 1 TABLET UNDER THE TONGUE AS NEEDED  . ondansetron (ZOFRAN) 4 MG tablet Take 1 tablet (4 mg total) by mouth daily as needed for nausea or vomiting. 4pm  . potassium chloride (K-DUR,KLOR-CON) 10 MEQ tablet Take 2 tablets (20 mEq total) by mouth daily.  . prednisoLONE sodium phosphate (INFLAMASE FORTE) 1 % ophthalmic solution Place 1 drop into both eyes daily.   .  RABEprazole (ACIPHEX) 20 MG tablet TAKE 1 TABLET DAILY  . tiotropium (SPIRIVA HANDIHALER) 18 MCG inhalation capsule Place 1 capsule (18 mcg total) into inhaler and inhale daily.  Marland Kitchen torsemide (DEMADEX) 20 MG tablet Take 2 tablets (40 mg total) by mouth as directed. Extra 93m MWF if needed  . traMADol (ULTRAM) 50 MG tablet Take 1 tablet (50 mg total) by mouth 2 (two) times daily. At 4pm and 10pm  . traZODone (DESYREL) 50 MG tablet TAKE ONE-HALF (1/2) TO 1 TABLET (25 TO 50 MG TOTAL) AT BEDTIME AS NEEDED FOR SLEEP  . Vitamin D, Ergocalciferol, (DRISDOL) 50000 UNITS CAPS capsule TAKE 1 CAPSULE ONCE EVERY WEEK   No current facility-administered medications on file prior to visit.    Review of Systems Per HPI unless specifically indicated in ROS section     Objective:    BP 126/58 mmHg  Pulse 65  Temp(Src) 98.2 F (36.8 C) (Oral)  Wt 132 lb 12 oz (60.215 kg)  SpO2 95%  Wt Readings from Last 3 Encounters:  03/27/15 132 lb 12 oz (60.215 kg)  03/14/15 134 lb 12.8 oz (61.145 kg)  01/24/15 130 lb 12.8 oz (59.33 kg)    Physical Exam  Constitutional: She appears well-developed and well-nourished. No distress.  HENT:  Mouth/Throat: Oropharynx is clear and moist. No oropharyngeal exudate.  Cardiovascular: Normal rate, regular rhythm and intact distal pulses.   No murmur heard. Extra heart sound ?rub/crackle best at LUSB during systole.   Pulmonary/Chest: Effort normal and breath sounds normal. No respiratory distress. She has no wheezes. She has no rales.  Crackles anterior left lungs with inhalation not otherwise.  Musculoskeletal: She exhibits no edema.  Skin: Skin is warm and dry. No rash noted.  Psychiatric: She has a normal mood and affect.  Nursing note and vitals reviewed.  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       Relevant Orders   Basic metabolic panel   Hemoglobin A1c   Heart sounds, abnormal - Primary    Unclear etiology. EKG without evidence of pericarditis. Check echocardiogram (last 05/2014).  EKG - KSR rate 60, nromal axis, intervals, nonspecific T flattening, Qs V1, no significant change from prior EKG 05/2014.      Relevant Orders   Echocardiogram   Coronary artery disease   Relevant Orders   EKG 12-Lead (Completed)   Chronic hypoxemic respiratory failure ([place]    Appreciate cards/pulm care of patient.      Chronic diastolic CHF (congestive  heart failure) (HCC)    Seems euvolemic. Continue torsemide 63m daily with 274mextra PM dose MWF. Continue KDur 2041mdaily.          Follow up plan: Return in about 3 months (around 06/24/2015), or as needed, for follow up visit.

## 2015-03-27 NOTE — Assessment & Plan Note (Addendum)
Unclear etiology. EKG without evidence of pericarditis. Check echocardiogram (last 05/2014).  EKG - KSR rate 60, nromal axis, intervals, nonspecific T flattening, Qs V1, no significant change from prior EKG 05/2014.

## 2015-03-27 NOTE — Assessment & Plan Note (Signed)
Appreciate cards/pulm care of patient.

## 2015-03-28 ENCOUNTER — Other Ambulatory Visit: Payer: Self-pay | Admitting: Family Medicine

## 2015-04-03 ENCOUNTER — Other Ambulatory Visit: Payer: Self-pay | Admitting: *Deleted

## 2015-04-03 MED ORDER — BD AUTOSHIELD DUO 30G X 5 MM MISC: Status: DC

## 2015-04-09 ENCOUNTER — Encounter: Payer: Self-pay | Admitting: Family Medicine

## 2015-04-14 ENCOUNTER — Other Ambulatory Visit: Payer: Self-pay

## 2015-04-14 ENCOUNTER — Ambulatory Visit (INDEPENDENT_AMBULATORY_CARE_PROVIDER_SITE_OTHER): Payer: Medicare Other

## 2015-04-14 DIAGNOSIS — R012 Other cardiac sounds: Secondary | ICD-10-CM | POA: Diagnosis not present

## 2015-04-15 LAB — ECHOCARDIOGRAM COMPLETE
AORTIC ROOT 2D: 32 mm
CHL CUP LA SIZE INDEX: 2.18 mm/m2
CHL CUP LA VOL 2D: 37.5 mL
CHL CUP LVOT MEAN VEL: 71 cm/s
CHL CUP LVOT SV INDEX: 44 mL/m2
CHL CUP MV DEC (S): 222 ms
E/e' ratio: 8.31
EWDT: 222 ms
FS: 29 % (ref 28–44)
IVS/LV PW RATIO, ED: 1.01
LA VOL 2D INDEX: 22.7 mL/m2
LA diam end sys: 36 mm
LASIZE: 36 mm
LV PW s: 8.84 mm
LVIDD: 45.2 mm — AB (ref 3.5–6.0)
LVIDS: 32.3 mm — AB (ref 2.1–4.0)
LVOT SV: 72 mL
LVOT VTI: 25.4 cm
LVOT area: 2.84 cm2
LVOT peak vel: 106 cm/s
LVOTD: 19 mm
MV Peak grad: 2 mmHg
MV pk E vel: 72.3 cm/s
MVPKAVEL: 82.5 cm/s
PW: 8.84 mm — AB (ref 0.6–1.1)
TAPSE: 14.6 mm
TDI e' lateral: 8.7 cm/s
TDI e' medial: 4.46 cm/s

## 2015-04-16 ENCOUNTER — Encounter: Payer: Self-pay | Admitting: Family Medicine

## 2015-04-16 ENCOUNTER — Ambulatory Visit (INDEPENDENT_AMBULATORY_CARE_PROVIDER_SITE_OTHER): Payer: Medicare Other | Admitting: Family Medicine

## 2015-04-16 VITALS — BP 116/70 | HR 64 | Temp 97.6°F | Wt 134.2 lb

## 2015-04-16 DIAGNOSIS — H9122 Sudden idiopathic hearing loss, left ear: Secondary | ICD-10-CM | POA: Diagnosis not present

## 2015-04-16 DIAGNOSIS — R110 Nausea: Secondary | ICD-10-CM

## 2015-04-16 DIAGNOSIS — I25119 Atherosclerotic heart disease of native coronary artery with unspecified angina pectoris: Secondary | ICD-10-CM

## 2015-04-16 DIAGNOSIS — R012 Other cardiac sounds: Secondary | ICD-10-CM | POA: Diagnosis not present

## 2015-04-16 NOTE — Assessment & Plan Note (Signed)
zofran ineffective. Finds dramamine works well, requests scheduled prior to supper.

## 2015-04-16 NOTE — Assessment & Plan Note (Signed)
Reviewed stable ultrasound with patient.

## 2015-04-16 NOTE — Patient Instructions (Addendum)
Heart ultrasound looked ok. We will refer you to audiologist to further evaluate hearing loss.  ?Meniere's disease as cause. Stop zofran, schedule meclizine 25mg  prior to supper.

## 2015-04-16 NOTE — Progress Notes (Signed)
Pre visit review using our clinic review tool, if applicable. No additional management support is needed unless otherwise documented below in the visit note. 

## 2015-04-16 NOTE — Addendum Note (Signed)
Addended by: Ria Bush on: 04/16/2015 11:07 AM   Modules accepted: Orders

## 2015-04-16 NOTE — Assessment & Plan Note (Addendum)
No cerumen impaction Failed hearing screen, weber lateralizes to good ear.  Leading dx is meniere's with associated longstanding nausea, dizziness, nausea, tinnitus. Ddx includes tumor, stroke. Discussed with patient. Start with audiology referral.

## 2015-04-16 NOTE — Progress Notes (Addendum)
BP 116/70 mmHg  Pulse 64  Temp(Src) 97.6 F (36.4 C) (Oral)  Wt 134 lb 4 oz (60.895 kg)   CC: hearing loss  Subjective:    Patient ID: Christine Blanchard, female    DOB: December 20, 1928, 81 y.o.   MRN: [id number]  HPI: Christine Blanchard is a 82 y.o. female presenting on 04/16/2015 for Follow-up and Hearing Loss   Sudden onset left hearing loss that started last week when she woke up from nap associated with dull headache. Denies ear pain fever/chill or congestion. Now unable to hear at all out of left ear.   Reviewed recent normal echo - obtained for abnormal heart sounds ?murmur.  Relevant past medical, surgical, family and social history reviewed and updated as indicated. Interim medical history since our last visit reviewed. Allergies and medications reviewed and updated. Current Outpatient Prescriptions on File Prior to Visit  Medication Sig  . acetaminophen (TYLENOL) 500 MG tablet Take 500 mg by mouth 3 (three) times daily.  Marland Kitchen aspirin 81 MG tablet Take 81 mg by mouth daily.   Marland Kitchen atorvastatin (LIPITOR) 20 MG tablet TAKE 1 TABLET NIGHTLY  . BD AUTOSHIELD DUO 30G X 5 MM MISC Use as instructed with insulin Dx: E11.21  . cetirizine (ZYRTEC ALLERGY) 10 MG tablet Take 1 tablet (10 mg total) by mouth daily.  . citalopram (CELEXA) 10 MG tablet TAKE 1 TABLET DAILY  . clopidogrel (PLAVIX) 75 MG tablet TAKE 1 TABLET DAILY  . dimenhyDRINATE (DRAMAMINE) 50 MG tablet Take 50 mg by mouth at bedtime as needed.  . diphenhydrAMINE (BENADRYL) 25 mg capsule Take 1 capsule (25 mg total) by mouth 3 (three) times daily as needed for itching.  . Docosanol (ABREVA) 10 % CREA AAA 4-5 times daily until resolved  . docusate sodium (COLACE) 100 MG capsule Take 1 capsule (100 mg total) by mouth daily.  . fenofibrate 54 MG tablet Take 1 tablet (54 mg total) by mouth daily.  . fluticasone (FLONASE) 50 MCG/ACT nasal spray Place 2 sprays into both nostrils daily.  Marland Kitchen glucose blood (FREESTYLE LITE) test strip Use to check  sugar three times daily Dx: 250.40  . glucose monitoring kit (FREESTYLE) monitoring kit 1 each by Does not apply route as needed for other.  . Insulin Glargine (LANTUS SOLOSTAR) 100 UNIT/ML Solostar Pen Inject 10 Units into the skin daily at 10 pm. (Patient taking differently: Inject 10 Units into the skin daily with breakfast. )  . isosorbide mononitrate (IMDUR) 30 MG 24 hr tablet Take 1 tablet (30 mg total) by mouth at bedtime.  . Lancets (FREESTYLE) lancets USE TO CHECK SUGAR THREE TIMES A DAY  . LORazepam (ATIVAN) 1 MG tablet Take 1 tablet (1 mg total) by mouth at bedtime. insomnia  . metFORMIN (GLUCOPHAGE-XR) 500 MG 24 hr tablet Take 2 tablets (1,000 mg total) by mouth at bedtime.  . metoprolol tartrate (LOPRESSOR) 25 MG tablet TAKE 1 TABLET TWICE A DAY  . Multiple Vitamin (MULTIVITAMIN) capsule Take 1 capsule by mouth daily.    . nitroGLYCERIN (NITROSTAT) 0.4 MG SL tablet DISSOLVE 1 TABLET UNDER THE TONGUE AS NEEDED  . potassium chloride (K-DUR,KLOR-CON) 10 MEQ tablet Take 2 tablets (20 mEq total) by mouth daily.  . prednisoLONE sodium phosphate (INFLAMASE FORTE) 1 % ophthalmic solution Place 1 drop into both eyes daily.   . RABEprazole (ACIPHEX) 20 MG tablet TAKE 1 TABLET DAILY  . tiotropium (SPIRIVA HANDIHALER) 18 MCG inhalation capsule Place 1 capsule (18 mcg total) into inhaler and  inhale daily.  Marland Kitchen torsemide (DEMADEX) 20 MG tablet Take 2 tablets (40 mg total) by mouth as directed. Extra 71m MWF if needed  . traMADol (ULTRAM) 50 MG tablet Take 1 tablet (50 mg total) by mouth 2 (two) times daily. At 4pm and 10pm  . traZODone (DESYREL) 50 MG tablet TAKE ONE-HALF (1/2) TO 1 TABLET (25 TO 50 MG TOTAL) AT BEDTIME AS NEEDED FOR SLEEP  . Vitamin D, Ergocalciferol, (DRISDOL) 50000 UNITS CAPS capsule TAKE 1 CAPSULE ONCE EVERY WEEK   No current facility-administered medications on file prior to visit.    Review of Systems Per HPI unless specifically indicated in ROS section     Objective:      BP 116/70 mmHg  Pulse 64  Temp(Src) 97.6 F (36.4 C) (Oral)  Wt 134 lb 4 oz (60.895 kg)  Wt Readings from Last 3 Encounters:  04/16/15 134 lb 4 oz (60.895 kg)  03/27/15 132 lb 12 oz (60.215 kg)  03/14/15 134 lb 12.8 oz (61.145 kg)    Physical Exam  Constitutional: She is oriented to person, place, and time. She appears well-developed and well-nourished. No distress.  HENT:  Right Ear: Tympanic membrane, external ear and ear canal normal. Decreased hearing is noted.  Left Ear: Tympanic membrane, external ear and ear canal normal. Decreased hearing is noted.  Nose: Right sinus exhibits no maxillary sinus tenderness and no frontal sinus tenderness. Left sinus exhibits no maxillary sinus tenderness and no frontal sinus tenderness.  Mouth/Throat: Uvula is midline, oropharynx is clear and moist and mucous membranes are normal. No oropharyngeal exudate, posterior oropharyngeal edema, posterior oropharyngeal erythema or tonsillar abscesses.  Cardiovascular: Normal rate, regular rhythm, normal heart sounds and intact distal pulses.   No murmur heard. Pulmonary/Chest: Effort normal and breath sounds normal. No respiratory distress. She has no wheezes. She has no rales.  Neurological: She is oriented to person, place, and time. No cranial nerve deficit.  CN 2-12 intact except for evident hearing loss out of left ear Failed hearing screen bilaterally Weber localizes to right ear Rinne abnormal on left  Nursing note and vitals reviewed.      Assessment & Plan:   Problem List Items Addressed This Visit    Sudden left hearing loss - Primary    No cerumen impaction Failed hearing screen, weber lateralizes to good ear.  Leading dx is meniere's with associated longstanding nausea, dizziness, nausea, tinnitus. Ddx includes tumor, stroke. Discussed with patient. Start with audiology referral.      Relevant Orders   Ambulatory referral to Audiology   Nausea    zofran ineffective. Finds  dramamine works well, requests scheduled prior to supper.       Heart sounds, abnormal    Reviewed stable ultrasound with patient.          Follow up plan: Return if symptoms worsen or fail to improve.

## 2015-04-18 ENCOUNTER — Other Ambulatory Visit: Payer: Self-pay | Admitting: Family Medicine

## 2015-04-19 ENCOUNTER — Emergency Department: Payer: Medicare Other

## 2015-04-19 ENCOUNTER — Encounter: Payer: Self-pay | Admitting: Emergency Medicine

## 2015-04-19 ENCOUNTER — Emergency Department
Admission: EM | Admit: 2015-04-19 | Discharge: 2015-04-19 | Disposition: A | Discharge status: Home / Self Care | Payer: Medicare Other | Attending: Emergency Medicine | Admitting: Emergency Medicine

## 2015-04-19 DIAGNOSIS — Y9289 Other specified places as the place of occurrence of the external cause: Secondary | ICD-10-CM | POA: Diagnosis not present

## 2015-04-19 DIAGNOSIS — R600 Localized edema: Secondary | ICD-10-CM | POA: Diagnosis not present

## 2015-04-19 DIAGNOSIS — L539 Erythematous condition, unspecified: Secondary | ICD-10-CM | POA: Diagnosis not present

## 2015-04-19 DIAGNOSIS — I129 Hypertensive chronic kidney disease with stage 1 through stage 4 chronic kidney disease, or unspecified chronic kidney disease: Secondary | ICD-10-CM | POA: Insufficient documentation

## 2015-04-19 DIAGNOSIS — S81802A Unspecified open wound, left lower leg, initial encounter: Secondary | ICD-10-CM | POA: Diagnosis not present

## 2015-04-19 DIAGNOSIS — N183 Chronic kidney disease, stage 3 (moderate): Secondary | ICD-10-CM | POA: Diagnosis not present

## 2015-04-19 DIAGNOSIS — Y998 Other external cause status: Secondary | ICD-10-CM | POA: Diagnosis not present

## 2015-04-19 DIAGNOSIS — L03116 Cellulitis of left lower limb: Secondary | ICD-10-CM | POA: Diagnosis not present

## 2015-04-19 DIAGNOSIS — L089 Local infection of the skin and subcutaneous tissue, unspecified: Secondary | ICD-10-CM | POA: Diagnosis not present

## 2015-04-19 DIAGNOSIS — X58XXXA Exposure to other specified factors, initial encounter: Secondary | ICD-10-CM | POA: Diagnosis not present

## 2015-04-19 DIAGNOSIS — Y9389 Activity, other specified: Secondary | ICD-10-CM | POA: Diagnosis not present

## 2015-04-19 DIAGNOSIS — L97921 Non-pressure chronic ulcer of unspecified part of left lower leg limited to breakdown of skin: Secondary | ICD-10-CM | POA: Diagnosis not present

## 2015-04-19 DIAGNOSIS — T148XXA Other injury of unspecified body region, initial encounter: Secondary | ICD-10-CM

## 2015-04-19 DIAGNOSIS — S81812A Laceration without foreign body, left lower leg, initial encounter: Secondary | ICD-10-CM | POA: Insufficient documentation

## 2015-04-19 DIAGNOSIS — E119 Type 2 diabetes mellitus without complications: Secondary | ICD-10-CM | POA: Insufficient documentation

## 2015-04-19 LAB — CBC WITH DIFFERENTIAL/PLATELET
BASOS ABS: 0.1 10*3/uL (ref 0–0.1)
BASOS PCT: 1 %
EOS ABS: 0.3 10*3/uL (ref 0–0.7)
EOS PCT: 4 %
HCT: 36.5 % (ref 35.0–47.0)
Hemoglobin: 11.7 g/dL — ABNORMAL LOW (ref 12.0–16.0)
Lymphocytes Relative: 35 %
Lymphs Abs: 3.3 10*3/uL (ref 1.0–3.6)
MCH: 26 pg (ref 26.0–34.0)
MCHC: 32.2 g/dL (ref 32.0–36.0)
MCV: 80.6 fL (ref 80.0–100.0)
MONO ABS: 1 10*3/uL — AB (ref 0.2–0.9)
Monocytes Relative: 11 %
Neutro Abs: 4.6 10*3/uL (ref 1.4–6.5)
Neutrophils Relative %: 49 %
PLATELETS: 194 10*3/uL (ref 150–440)
RBC: 4.52 MIL/uL (ref 3.80–5.20)
RDW: 15.9 % — AB (ref 11.5–14.5)
WBC: 9.2 10*3/uL (ref 3.6–11.0)

## 2015-04-19 LAB — BASIC METABOLIC PANEL
ANION GAP: 7 (ref 5–15)
BUN: 15 mg/dL (ref 6–20)
CALCIUM: 9 mg/dL (ref 8.9–10.3)
CO2: 34 mmol/L — ABNORMAL HIGH (ref 22–32)
Chloride: 97 mmol/L — ABNORMAL LOW (ref 101–111)
Creatinine, Ser: 0.64 mg/dL (ref 0.44–1.00)
GFR calc Af Amer: >60 mL/min (ref >60)
GLUCOSE: 185 mg/dL — AB (ref 65–99)
Potassium: 3.4 mmol/L — ABNORMAL LOW (ref 3.5–5.1)
Sodium: 138 mmol/L (ref 135–145)

## 2015-04-19 MED ORDER — BACITRACIN ZINC 500 UNIT/GM EX OINT
TOPICAL_OINTMENT | Freq: Two times a day (BID) | CUTANEOUS | Status: DC
  Administered 2015-04-19: 1 via TOPICAL

## 2015-04-19 MED ORDER — CEPHALEXIN 500 MG PO CAPS
500.0000 mg | ORAL_CAPSULE | Freq: Once | ORAL | Status: AC
  Administered 2015-04-19: 500 mg via ORAL
  Filled 2015-04-19: qty 1

## 2015-04-19 MED ORDER — MUPIROCIN 2 % EX OINT: TOPICAL_OINTMENT | CUTANEOUS | Status: DC

## 2015-04-19 MED ORDER — BACITRACIN ZINC 500 UNIT/GM EX OINT
TOPICAL_OINTMENT | CUTANEOUS | Status: AC
  Administered 2015-04-19: 1 via TOPICAL
  Filled 2015-04-19: qty 0.9

## 2015-04-19 MED ORDER — CEPHALEXIN 500 MG PO CAPS: 500.0000 mg | ORAL_CAPSULE | Freq: Four times a day (QID) | ORAL | Status: AC

## 2015-04-19 NOTE — ED Notes (Signed)
Pt fell about one week ago and had a small wound to LLE.  Wound has been getting worse.  Patient is diabetic and wound has not healed.  Pt reports doctor did not want her to wait until Monday to get checked because concerned about it getting infected.  Pt denies fevers.  Has been draining but she is not sure what color.  Has been warm to touch as well.

## 2015-04-19 NOTE — Discharge Instructions (Signed)

## 2015-04-19 NOTE — ED Provider Notes (Addendum)
[facility] Emergency Department Provider Note     Time seen: ----------------------------------------- 5:15 PM on 04/19/2015 -----------------------------------------    I have reviewed the triage vital signs and the nursing notes.   HISTORY  Chief Complaint diabetic ulcer     HPI Christine Blanchard is a 82 y.o. female who presents to ER with a wound on her left lower extremity. She states the wound is gotten worse and she's noted more swelling. She is diabetic and the wound just has not healed. Patient reports her doctor did not want her to wait until Monday to get checked because he was concerned it was getting infected, she denies any fevers. Her blood sugar has been relatively normal. She has had some wound drainage. Past Medical History  Diagnosis Date  . Hypertension   . Diabetes mellitus 2003    previously saw Dr. Elyse Hsu  . Hyperlipidemia   . History of chicken pox     has had shingles shot  . History of diverticulitis of colon 1991, 1992, 1994  . History of kidney stones 1952  . CVA (cerebral infarction)     after a stent, minimal R residual weakness, affects speech when tired, ?mild dysequilibrium  . Urinary incontinence     stress/urge, per D. Tannenbaum/[place]  . CAD (coronary artery disease)     severe, s/p 4 cardiac caths, minimally invasive CABG @[facility] , ICA stent 2004 then LAD stent 2005, MEDICAL MANAGEMENT ONLY, MAY USE REPEATED SL NITRO  . Breathing difficulty 2011    on O2 since 10/2009  . Syncope 2005    s/p w/u including CT scan and 24 hour Holter  . GERD (gastroesophageal reflux disease) 2004  . Dry senile macular degeneration     s/p corneal transplants ([facility])  . Benign essential tremor     toprol XL and remeron  . Herpes zoster ophthalmicus 2009    history  . Hearing loss of both ears 08/2010    audiology eval 08/2010 - mod sensorineural heaing loss, rec trial digital hearing aids and re eval yearly to monitor  .  Vitamin D deficiency     on 50k units weekly  . Diastolic CHF ([place]) [id number]    grade 1, normal EF, mildly dilated LA, normal PA pressures  . Lumbar spinal stenosis     with severe spondylosis, s/p laminotomy  . Allergic rhinitis   . Chronic kidney disease     stage 3 kidney disease.  . Leg weakness, bilateral 2014    s/p neuro eval - thought multifactorial (diabetic neuropathy, lumbar stenosis, and chronic disease deconditioning).  Conservative management recommended  . Gait instability   . Wedge compression fracture of T10 vertebra ([place]) 2015    by CXR  . Fracture of femoral neck, right ([place]) 03/2014    s/p surgery  . Squamous cell skin cancer, upper arm 03/2015    R upper arm Nehemiah Massed)    Patient Active Problem List   Diagnosis Date Noted  . Sudden left hearing loss 04/16/2015  . Heart sounds, abnormal 03/27/2015  . Left shoulder pain 09/19/2014  . Pruritic condition 06/04/2014  . Advanced care planning/counseling discussion 06/04/2014  . DNR (do not resuscitate) 06/04/2014  . Cough 05/07/2014  . Nausea 09/05/2013  . Shaking spells 08/29/2013  . LLQ abdominal pain 07/18/2013  . Wedge compression fracture of T10 vertebra ([place]) 05/28/2013  . CKD (chronic kidney disease) stage 3, GFR 30-59 ml/min 01/18/2013  . CAD (coronary artery disease) of artery bypass graft  12/27/2012  . Weakness of both legs 06/25/2012  . Asymptomatic bacteriuria 02/07/2012  . Chronic diastolic CHF (congestive heart failure) ([place]) 12/17/2011  . Insomnia 06/25/2011  . Chronic hypoxemic respiratory failure ([place]) 03/10/2011  . MDD (major depressive disorder), recurrent episode, moderate ([place]) 02/05/2011  . Benign essential tremor 02/05/2011  . Dysphagia 01/14/2011  . Gait instability 12/16/2010  . Vitamin D deficiency   . Medicare annual wellness visit, subsequent 08/04/2010  . Hearing impairment 08/04/2010  . Allergic rhinitis, seasonal 06/11/2010  . Hyperlipidemia 03/02/2010  . Coronary artery  disease 10/30/2009  . CVA 10/30/2009  . Well controlled type 2 diabetes mellitus with nephropathy ([place]) 10/03/2009  . Essential hypertension 10/03/2009  . DYSPNEA 10/03/2009    Past Surgical History  Procedure Laterality Date  . Cholecystectomy  1985  . Cervical fusion  2002    anterior decompression and fusion C6/7  . Coronary artery bypass graft  2004  . Percutaneous coronary stent intervention (pci-s)  2005    stents x 2 (intermediate and LAD), 2nd complicated by stroke  . Corneal transplant      x3  . Appendectomy  1948  . Breast biopsy  2001    x2, both benign, last mammo 2011, last pap smear 2011  . Cardiac catheterization  2009    x5  . Cataract extraction      bilateral, corneal dystrophy  . Cardiovascular stress test  2010    nuclear, normal  . Dexa  2005    normal  . Lumbar spine surgery  2010    laminotomy (L2/3, 3/4)  . Hip fracture surgery Right 03/2014    closed reduction percutaneous pinning of R femoral neck fracture ([place], [facility])    Allergies Actos; Pioglitazone; Atenolol; Codeine; Eszopiclone; Eszopiclone; Exenatide; Morphine; Sulfa antibiotics; Sulfacetamide sodium; Vicodin; Exenatide; and Hydrocodone-acetaminophen  Social History Social History  Substance Use Topics  . Smoking status: Never Smoker   . Smokeless tobacco: Never Used  . Alcohol Use: No    Review of Systems Constitutional: Negative for fever. Eyes: Negative for visual changes. ENT: Negative for sore throat. Cardiovascular: Negative for chest pain. Respiratory: Negative for shortness of breath. Musculoskeletal: Positive for left lower extremity wound and swelling Skin: Positive for left lower extremity wound Neurological: Negative for focal weakness or numbness  ____________________________________________   PHYSICAL EXAM:  VITAL SIGNS: ED Triage Vitals  Enc Vitals Group     BP 04/19/15 1608 112/57 mmHg     Pulse Rate 04/19/15 1608 71     Resp 04/19/15 1608 20     Temp  04/19/15 1608 97.7 F (36.5 C)     Temp Source 04/19/15 1608 Oral     SpO2 04/19/15 1608 94 %     Weight 04/19/15 1608 133 lb (60.328 kg)     Height 04/19/15 1608 5\' 4"  (1.626 m)     Head Cir --      Peak Flow --      Pain Score --      Pain Loc --      Pain Edu? --      Excl. in [place]? --     Constitutional: Alert and oriented. Well appearing and in no distress. Eyes: Conjunctivae are normal. PERRL. Normal extraocular movements. Cardiovascular: Normal rate, regular rhythm. Normal and symmetric distal pulses are present in all extremities. No murmurs, rubs, or gallops. Respiratory: Normal respiratory effort without tachypnea nor retractions. Breath sounds are clear and equal bilaterally. No wheezes/rales/rhonchi. Gastrointestinal: Soft and nontender. No distention.  No abdominal bruits.  Musculoskeletal: Increased Edema of the left lower extremity compared to right., Mild left lower extremity erythema with small wound Neurologic:  Normal speech and language. No gross focal neurologic deficits are appreciated.  Skin:  Mild erythema with coin-sized skin tear on the left distal tibia anteriorly Psychiatric: Mood and affect are normal.  ____________________________________________  ED COURSE:  Pertinent labs & imaging results that were available during my care of the patient were reviewed by me and considered in my medical decision making (see chart for details). Patient is in no acute distress, likely early cellulitis. I will obtain imaging to ensure no DVT. We'll check basic labs. ____________________________________________    LABS (pertinent positives/negatives)  Labs Reviewed  CBC WITH DIFFERENTIAL/PLATELET - Abnormal; Notable for the following:    Hemoglobin 11.7 (*)    RDW 15.9 (*)    Monocytes Absolute 1.0 (*)    All other components within normal limits  BASIC METABOLIC PANEL - Abnormal; Notable for the following:    Potassium 3.4 (*)    Chloride 97 (*)    CO2 34 (*)     Glucose, Bld 185 (*)    All other components within normal limits    RADIOLOGY X-rays of the leg are normal.  Ultrasound left lower extremity Is unremarkable ____________________________________________  FINAL ASSESSMENT AND PLAN  Cellulitis, diabetic leg wound  Plan: Patient with labs and imaging as dictated above. Patient is no acute distress, there is appear to be significant infection currently, no DVT. She'll be discharged with Keflex is encouraged to have close follow-up with her doctor for reevaluation. I will also advise Bactroban ointment to the wound.   Earleen Newport, MD  Earleen Newport, MD 04/19/15 [place], MD 04/19/15 [id number]

## 2015-04-21 ENCOUNTER — Telehealth: Payer: Self-pay

## 2015-04-21 NOTE — Telephone Encounter (Signed)
Different DOB but think this is same pt; pt seen [facility] ED on 04/19/15.

## 2015-04-21 NOTE — Telephone Encounter (Signed)
Spoke with patient and she did go to ER and has follow up with Anda Kraft on 04/23/15. She is on abx and seems to be doing better.

## 2015-04-21 NOTE — Telephone Encounter (Signed)
Please get update on patient.  See below re: DOB.  Thanks.

## 2015-04-21 NOTE — Telephone Encounter (Signed)
PLEASE NOTE: All timestamps contained within this report are represented as [place] Standard Time. CONFIDENTIALTY NOTICE: This fax transmission is intended only for the addressee. It contains information that is legally privileged, confidential or otherwise protected from use or disclosure. If you are not the intended recipient, you are strictly prohibited from reviewing, disclosing, copying using or disseminating any of this information or taking any action in reliance on or regarding this information. If you have received this fax in error, please notify us immediately by telephone so that we can arrange for its return to [place]. Phone: [phone], Toll-Free: [phone], Fax: [phone] Page: 1 of 4 Call Id: [id number] [place] Patient Name: Christine Blanchard Gender: Female DOB: 04/02/1928 Age: 82 Y 10 M 18 D Return Phone Number: [phone] (Primary) Address: City/State/Zip: [place] Client [place] Day - Client Client Site [place] Physician Ria Bush Contact Type Call Who Is Calling Patient / Member / Family / Caregiver Call Type Triage / Clinical Caller Name Carmelina Noun Relationship To Patient Provider Return Phone Number [phone] (Primary) Chief Complaint Wound Check or Dressing Change Reason for Call Symptomatic / Request for Health Information Initial Comment Caller stated that [place] calling from [facility] on behalf of diabetic patient who has a wound that still looks nasty not hot to the touch anymore but looks like cellulitis. Appointment Disposition EMR Appointment Attempted - Not Scheduled Info pasted into Epic No PreDisposition Call Doctor Translation No Nurse Assessment Nurse: Lillia Corporal, RN, Lenell Antu Date/Time (Eastern Time): 04/19/2015 1:11:03 PM Confirm and document reason for call. If symptomatic, describe  symptoms. You must click the next button to save text entered. ---Caller stated that Minette [place] calling from [facility] on behalf of diabetic patient who has a wound that still looks nasty not hot to the touch anymore but looks like cellulitis. States she had fallen and last Monday developed a nick on leg, which scabbed over. Yesterday staff noticed it became beefy red, swollen, and warm to the touch. It was oozing as well. It is still red but no longer warm to touch, site is tender. Wound is a quarter size and inflammed area is approx 1 inches wide. Denies any streaks. States thinks that a topical antibiotic and cleaning might resolved. It has been less than 24 hours on topical antibiotic. Allergic to sulfa and codeine, actos, Has the patient traveled out of the country within the last 30 days? ---No Does the patient have any new or worsening symptoms? ---Yes Will a triage be completed? ---Yes Related visit to physician within the last 2 weeks? ---No Does the PT have any chronic conditions? (i.e. diabetes, asthma, etc.) ---Yes List chronic conditions. ---diabetes Is this a behavioral health or substance abuse call? ---No PLEASE NOTE: All timestamps contained within this report are represented as [place] Standard Time. CONFIDENTIALTY NOTICE: This fax transmission is intended only for the addressee. It contains information that is legally privileged, confidential or otherwise protected from use or disclosure. If you are not the intended recipient, you are strictly prohibited from reviewing, disclosing, copying using or disseminating any of this information or taking any action in reliance on or regarding this information. If you have received this fax in error, please notify us immediately by telephone so that we can arrange for its return to [place]. Phone: [phone], Toll-Free: [phone], Fax: [phone] Page: 2 of 4 Call Id: [id number] Guidelines Guideline Title Affirmed  Question  Affirmed Notes Nurse Date/Time Eilene Ghazi Time) Wound Infection [1] Pus or cloudy fluid draining from wound AND [2] no fever Lillia Corporal, RN, Lenell Antu 04/19/2015 1:19:02 PM Disp. Time Eilene Ghazi Time) Disposition Final User 04/19/2015 1:27:59 PM Paged On Call back to [facility], [place], Lenell Antu 04/19/2015 1:28:22 PM Send To RN Personal Lillia Corporal, RN, [facility] 04/19/2015 1:35:33 PM Call Completed Lillia Corporal, RN, [facility] 04/19/2015 1:37:16 PM Send To RN Personal Lillia Corporal, RN, [place] 04/19/2015 1:55:19 PM Send To RN Personal Lillia Corporal, RN, [place] 04/19/2015 1:56:14 PM Call Completed Lillia Corporal, RN, [facility] 04/19/2015 2:19:09 PM Call Completed Lillia Corporal, RN, [facility] 04/19/2015 1:24:45 PM See Physician within 24 Hours Yes Lillia Corporal, RN, [place] Understands: Yes Disagree/Comply: Comply Care Advice Given Per Guideline SEE PHYSICIAN WITHIN 24 HOURS: WARM SOAKS OR LOCAL HEAT: * If the wound is CLOSED, apply a heating pad or warm, wet washcloth to the reddened area for 20 minutes three times a day. Do not soak the wound in water. ANTIBIOTIC OINTMENT: Apply an OTC antibiotic ointment (e.g., Bacitracin) 3 times a day. If the area could become dirty, cover with a Band- Aid or a gauze dressing. CALL BACK IF: * Fever occurs * You become worse. CARE ADVICE per Wound Infection (Adult) guideline. Verbal Orders/Maintenance Medications Medication Refill Route Dosage Regime Duration Admin Instructions User Name Bacitracin ointment Topical apply to affected area three times daily 3 Days Apply to affected area after applying warm moist heat to site for 20 minutes. Repeat three times daily for 3 days. Call office on Monday to inform MD of status of wound. Lillia Corporal, RN, Lenell Antu Comments User: Arcola Jansky, RN Date/Time Eilene Ghazi Time): 04/19/2015 1:35:13 PM Informed krsitine Bridget Hartshorn, nurse at [facility] of V.O. from MD for patient to be seen at [facility] or ED within 24 hours. Caller verbalizes understanding. User: Arcola Jansky, RN Date/Time Eilene Ghazi Time):  04/19/2015 2:18:58 PM Office closed today, Paste into EPIC not performed. PLEASE NOTE: All timestamps contained within this report are represented as [place] Standard Time. CONFIDENTIALTY NOTICE: This fax transmission is intended only for the addressee. It contains information that is legally privileged, confidential or otherwise protected from use or disclosure. If you are not the intended recipient, you are strictly prohibited from reviewing, disclosing, copying using or disseminating any of this information or taking any action in reliance on or regarding this information. If you have received this fax in error, please notify us immediately by telephone so that we can arrange for its return to [place]. Phone: [phone], Toll-Free: [phone], Fax: [phone] Page: 3 of 4 Call Id: [id number] Referrals REFERRED TO PCP OFFICE Paging DoctorName Phone DateTime Result/Outcome Message Type Notes Arnette Norris [id number] 04/19/2015 1:27:59 PM Paged On Call Back to Call Center Doctor Paged [place], RN, with [facility], [phone], regarding [facility] patient of Dr. Susette Racer with diabetes and development of redness and drainage on a quarter size superficial abrasion to leg. Disposition see MD in 24 hours, need ok for topical antibiotic or other V.O. Arnette Norris 04/19/2015 1:32:45 PM Spoke with On Call - Outcome Notification Message Result Per MD, instruct staff to have patient seen at an urgent care clinic or ER within 24 hours. PLEASE NOTE: All timestamps contained within this report are represented as [place] Standard Time. CONFIDENTIALTY NOTICE: This fax transmission is intended only for the addressee. It contains information that is legally privileged, confidential or otherwise protected from use or disclosure. If you are not the intended recipient, you are strictly prohibited from reviewing, disclosing, copying using or disseminating any of  this information or taking any action in  reliance on or regarding this information. If you have received this fax in error, please notify us immediately by telephone so that we can arrange for its return to [place]. Phone: [phone], Toll-Free: [phone], Fax: [phone] Page: 4 of 4 Call Id: [id number] [place] [address] [place] [address] [phone] Fax: [phone] [place] - Day Date: 04/19/2015 From: QI Department To: Ria Bush Please sign the order for the approved drug(s) given by our call center nurse on your behalf. Fax to [phone] within 5 business days. Thank you. Date Eilene Ghazi Time): 04/19/2015 12:30:39 PM Triage RN: Arcola Jansky, RN NAME: Taziyah Doiron PHONE NUMBER: [phone] (Primary) BIRTHDATE: 1928/05/25 ADDRESS: CITY/STATE/ZIP: [place] CALLER: Provider NAME: Carmelina Noun Rx Given Medication Refill Route Dosage Regime Duration Admin Instructions Bacitracin ointment Topical apply to affected area three times daily 3 Days Apply to affected area applying warm moist site for 20 minutes. three times daily for Call office on Monday inform MD of status wound. MD Signature Date

## 2015-04-23 ENCOUNTER — Encounter: Payer: Self-pay | Admitting: Primary Care

## 2015-04-23 ENCOUNTER — Ambulatory Visit (INDEPENDENT_AMBULATORY_CARE_PROVIDER_SITE_OTHER): Payer: Medicare Other | Admitting: Primary Care

## 2015-04-23 VITALS — BP 116/66 | HR 89 | Temp 97.8°F | Ht 64.0 in | Wt 133.8 lb

## 2015-04-23 DIAGNOSIS — L03116 Cellulitis of left lower limb: Secondary | ICD-10-CM

## 2015-04-23 DIAGNOSIS — I25119 Atherosclerotic heart disease of native coronary artery with unspecified angina pectoris: Secondary | ICD-10-CM

## 2015-04-23 NOTE — Progress Notes (Signed)
Pre visit review using our clinic review tool, if applicable. No additional management support is needed unless otherwise documented below in the visit note. 

## 2015-04-23 NOTE — Patient Instructions (Signed)
Continue Keflex antibiotics as prescribed. Continue dressing changes as discussed.  Follow up with Dr. Danise Mina or myself in 1 week for wound recheck.  Please call sooner if you notice the wound getting worse (increased redness, drainage, or notice any pain.)  It was a pleasure meeting you!

## 2015-04-23 NOTE — Progress Notes (Signed)
Subjective:    Patient ID: Christine Blanchard, female    DOB: 1928/04/14, 82 y.o.   MRN: [id number]  HPI  Christine Blanchard is an 82 year old female with a history of Type 2 diabetes, CHF, CKD, and CAD who presents today for hospital follow up.  She presented to [facility] ED on 04/19/15 with complaints of left lower extremity wound. She fell Sunday March 12th and nicked her skin. She noticed her skin become very red with drainage on Thursday later that week.  She underwent labs and imaging which were unremarkable. She was discharged home on Keflex and told to follow closely with PCP.   Since her ED visit she's noticed a reduction in erythema and swelling. She resides at [facility] and is receiving dressing changes four times daily and routine temperature checks without evidence of fevers. She denies pain but has diabetic neuropathy. Denies weakness, fevers, changes in appetite.  Review of Systems  Constitutional: Negative for fever and fatigue.  Skin: Positive for color change and wound.  Neurological: Negative for weakness.       Past Medical History  Diagnosis Date  . Hypertension   . Diabetes mellitus 2003    previously saw Dr. Elyse Hsu  . Hyperlipidemia   . History of chicken pox     has had shingles shot  . History of diverticulitis of colon 1991, 1992, 1994  . History of kidney stones 1952  . CVA (cerebral infarction)     after a stent, minimal R residual weakness, affects speech when tired, ?mild dysequilibrium  . Urinary incontinence     stress/urge, per D. Tannenbaum/[place]  . CAD (coronary artery disease)     severe, s/p 4 cardiac caths, minimally invasive CABG @[facility] , ICA stent 2004 then LAD stent 2005, MEDICAL MANAGEMENT ONLY, MAY USE REPEATED SL NITRO  . Breathing difficulty 2011    on O2 since 10/2009  . Syncope 2005    s/p w/u including CT scan and 24 hour Holter  . GERD (gastroesophageal reflux disease) 2004  . Dry senile macular degeneration     s/p corneal transplants  ([facility])  . Benign essential tremor     toprol XL and remeron  . Herpes zoster ophthalmicus 2009    history  . Hearing loss of both ears 08/2010    audiology eval 08/2010 - mod sensorineural heaing loss, rec trial digital hearing aids and re eval yearly to monitor  . Vitamin D deficiency     on 50k units weekly  . Diastolic CHF ([place]) 05/3274    grade 1, normal EF, mildly dilated LA, normal PA pressures  . Lumbar spinal stenosis     with severe spondylosis, s/p laminotomy  . Allergic rhinitis   . Chronic kidney disease     stage 3 kidney disease.  . Leg weakness, bilateral 2014    s/p neuro eval - thought multifactorial (diabetic neuropathy, lumbar stenosis, and chronic disease deconditioning).  Conservative management recommended  . Gait instability   . Wedge compression fracture of T10 vertebra ([place]) 2015    by CXR  . Fracture of femoral neck, right ([place]) 03/2014    s/p surgery  . Squamous cell skin cancer, upper arm 03/2015    R upper arm Nehemiah Massed)    Social History   Social History  . Marital Status: Married    Spouse Name: N/A  . Number of Children: 2  . Years of Education: college   Occupational History  .  Social History Main Topics  . Smoking status: Never Smoker   . Smokeless tobacco: Never Used  . Alcohol Use: No  . Drug Use: No  . Sexual Activity: Not on file   Other Topics Concern  . Not on file   Social History Narrative   Lives alone at [place] @ [place].  No pets   Has lifeline.   Daughter nearby [facility]) is CHF RN at [employer] nearby [place])    Past Surgical History  Procedure Laterality Date  . Cholecystectomy  1985  . Cervical fusion  2002    anterior decompression and fusion C6/7  . Coronary artery bypass graft  2004  . Percutaneous coronary stent intervention (pci-s)  2005    stents x 2 (intermediate and LAD), 2nd complicated by stroke  . Corneal transplant      x3  . Appendectomy  1948  . Breast biopsy  2001      x2, both benign, last mammo 2011, last pap smear 2011  . Cardiac catheterization  2009    x5  . Cataract extraction      bilateral, corneal dystrophy  . Cardiovascular stress test  2010    nuclear, normal  . Dexa  2005    normal  . Lumbar spine surgery  2010    laminotomy (L2/3, 3/4)  . Hip fracture surgery Right 03/2014    closed reduction percutaneous pinning of R femoral neck fracture ([facility], [facility])    Family History  Problem Relation Age of Onset  . Cancer Mother     colon  . Heart disease Father   . Cancer Brother     bladder    Allergies  Allergen Reactions  . Actos [Pioglitazone Hydrochloride] Shortness Of Breath    CHF  . Pioglitazone Shortness Of Breath    CHF REACTION: heart failure  . Atenolol Other (See Comments)    lethargy  . Codeine     REACTION: vomiting  . Eszopiclone Other (See Comments)    Nightmares  . Eszopiclone   . Exenatide Nausea Only  . Morphine     REACTION: nausea  . Sulfa Antibiotics   . Sulfacetamide Sodium   . Vicodin [Hydrocodone-Acetaminophen] Nausea And Vomiting  . Exenatide Nausea Only  . Hydrocodone-Acetaminophen Nausea And Vomiting    Current Outpatient Prescriptions on File Prior to Visit  Medication Sig Dispense Refill  . acetaminophen (TYLENOL) 500 MG tablet Take 500 mg by mouth 3 (three) times daily.    Marland Kitchen aspirin 81 MG tablet Take 81 mg by mouth daily.     Marland Kitchen atorvastatin (LIPITOR) 20 MG tablet TAKE 1 TABLET NIGHTLY 90 tablet 1  . BD AUTOSHIELD DUO 30G X 5 MM MISC Use as instructed with insulin Dx: E11.21 100 each 3  . cephALEXin (KEFLEX) 500 MG capsule Take 1 capsule (500 mg total) by mouth 4 (four) times daily. 40 capsule 0  . cetirizine (ZYRTEC ALLERGY) 10 MG tablet Take 1 tablet (10 mg total) by mouth daily. 90 tablet 1  . citalopram (CELEXA) 10 MG tablet TAKE 1 TABLET DAILY 90 tablet 1  . clopidogrel (PLAVIX) 75 MG tablet TAKE 1 TABLET DAILY 90 tablet 3  . dimenhyDRINATE (DRAMAMINE) 50 MG tablet Take 50 mg by  mouth at bedtime as needed.    . diphenhydrAMINE (BENADRYL) 25 mg capsule Take 1 capsule (25 mg total) by mouth 3 (three) times daily as needed for itching. 90 capsule 0  . Docosanol (ABREVA) 10 %  CREA AAA 4-5 times daily until resolved  0  . docusate sodium (COLACE) 100 MG capsule Take 1 capsule (100 mg total) by mouth daily. 90 capsule 1  . fenofibrate 54 MG tablet Take 1 tablet (54 mg total) by mouth daily. 90 tablet 1  . fluticasone (FLONASE) 50 MCG/ACT nasal spray Place 2 sprays into both nostrils daily. 48 g 3  . glucose blood (FREESTYLE LITE) test strip Use to check sugar three times daily Dx: 250.40 100 each 3  . glucose monitoring kit (FREESTYLE) monitoring kit 1 each by Does not apply route as needed for other. 1 each 0  . Insulin Glargine (LANTUS SOLOSTAR) 100 UNIT/ML Solostar Pen Inject 10 Units into the skin daily with breakfast. 15 mL 6  . isosorbide mononitrate (IMDUR) 30 MG 24 hr tablet Take 1 tablet (30 mg total) by mouth at bedtime. 90 tablet 3  . Lancets (FREESTYLE) lancets USE TO CHECK SUGAR THREE TIMES A DAY 300 each 2  . LORazepam (ATIVAN) 1 MG tablet Take 1 tablet (1 mg total) by mouth at bedtime. insomnia 90 tablet 1  . meclizine (ANTIVERT) 25 MG tablet Take 1 tablet (25 mg total) by mouth daily before supper.  0  . metFORMIN (GLUCOPHAGE-XR) 500 MG 24 hr tablet Take 2 tablets (1,000 mg total) by mouth at bedtime.    . metoprolol tartrate (LOPRESSOR) 25 MG tablet TAKE 1 TABLET TWICE A DAY 180 tablet 3  . Multiple Vitamin (MULTIVITAMIN) capsule Take 1 capsule by mouth daily.      . mupirocin ointment (BACTROBAN) 2 % Apply to affected area 3 times daily 22 g 0  . nitroGLYCERIN (NITROSTAT) 0.4 MG SL tablet DISSOLVE 1 TABLET UNDER THE TONGUE AS NEEDED 30 tablet 3  . potassium chloride (K-DUR,KLOR-CON) 10 MEQ tablet Take 2 tablets (20 mEq total) by mouth daily. 180 tablet 1  . prednisoLONE sodium phosphate (INFLAMASE FORTE) 1 % ophthalmic solution Place 1 drop into both eyes  daily.     . RABEprazole (ACIPHEX) 20 MG tablet TAKE 1 TABLET DAILY 90 tablet 2  . tiotropium (SPIRIVA HANDIHALER) 18 MCG inhalation capsule Place 1 capsule (18 mcg total) into inhaler and inhale daily. 90 capsule 3  . torsemide (DEMADEX) 20 MG tablet Take 2 tablets (40 mg total) by mouth as directed. Extra 8m MWF if needed 225 tablet 3  . traMADol (ULTRAM) 50 MG tablet Take 1 tablet (50 mg total) by mouth 2 (two) times daily. At 4pm and 10pm 60 tablet 3  . traZODone (DESYREL) 50 MG tablet TAKE ONE-HALF (1/2) TO 1 TABLET (25 TO 50 MG TOTAL) AT BEDTIME AS NEEDED FOR SLEEP 90 tablet 1  . Vitamin D, Ergocalciferol, (DRISDOL) 50000 UNITS CAPS capsule TAKE 1 CAPSULE ONCE EVERY WEEK 13 capsule 1   No current facility-administered medications on file prior to visit.    BP 116/66 mmHg  Pulse 89  Temp(Src) 97.8 F (36.6 C) (Oral)  Ht 5' 4"  (1.626 m)  Wt 133 lb 12.8 oz (60.691 kg)  BMI 22.96 kg/m2  SpO2 97%    Objective:   Physical Exam  Constitutional: She appears well-nourished.  Cardiovascular: Normal rate and regular rhythm.   Pulmonary/Chest: Effort normal and breath sounds normal.  Skin: Skin is warm. There is erythema.  Mild-moderate erythema with open wound to left lower anterior extremity. Non tender. Appears to be healing.          Assessment & Plan:  ED Follow Up:  [facility]  for erythema and open wound to her left lower extremity after falling several days prior. Imaging and labs at [facility] unremarkable. Initiated on Keflex and has been compliant to regimen. She's receiving dressing changes multiple times daily. She endorses significant improvement to erythema and swelling. Exam with mild-moderate erythema, no swelling, open wound that appears to be healing well. Will have her follow up in 1 week for wound re-check with PCP or myself. Exam stable today. No weakness, fevers. Vitals stable.  All notes, labs, and imaging reviewed from [facility] stay.

## 2015-04-24 ENCOUNTER — Ambulatory Visit: Payer: Medicare Other | Admitting: Primary Care

## 2015-04-24 DIAGNOSIS — H903 Sensorineural hearing loss, bilateral: Secondary | ICD-10-CM | POA: Diagnosis not present

## 2015-04-24 DIAGNOSIS — H90A12 Conductive hearing loss, unilateral, left ear with restricted hearing on the contralateral side: Secondary | ICD-10-CM | POA: Diagnosis not present

## 2015-04-25 ENCOUNTER — Ambulatory Visit: Payer: Medicare Other | Admitting: Primary Care

## 2015-04-30 ENCOUNTER — Ambulatory Visit (INDEPENDENT_AMBULATORY_CARE_PROVIDER_SITE_OTHER): Payer: Medicare Other | Admitting: Family Medicine

## 2015-04-30 ENCOUNTER — Encounter: Payer: Self-pay | Admitting: Family Medicine

## 2015-04-30 VITALS — BP 114/66 | HR 72 | Temp 98.1°F | Wt 133.0 lb

## 2015-04-30 DIAGNOSIS — I25119 Atherosclerotic heart disease of native coronary artery with unspecified angina pectoris: Secondary | ICD-10-CM

## 2015-04-30 DIAGNOSIS — S81802D Unspecified open wound, left lower leg, subsequent encounter: Secondary | ICD-10-CM | POA: Diagnosis not present

## 2015-04-30 DIAGNOSIS — E1121 Type 2 diabetes mellitus with diabetic nephropathy: Secondary | ICD-10-CM

## 2015-04-30 DIAGNOSIS — S81802A Unspecified open wound, left lower leg, initial encounter: Secondary | ICD-10-CM | POA: Insufficient documentation

## 2015-04-30 NOTE — Assessment & Plan Note (Signed)
Cellulitis improving. Measured wound.  Return 1 wk recheck.

## 2015-04-30 NOTE — Progress Notes (Signed)
BP 114/66 mmHg  Pulse 72  Temp(Src) 98.1 F (36.7 C) (Oral)  Wt 133 lb (60.328 kg)   CC: 1 wk f/u visit  Subjective:    Patient ID: Christine Blanchard, female    DOB: September 15, 1928, 82 y.o.   MRN: [id number]  HPI: DAMARA KLUNDER is a 82 y.o. female presenting on 04/30/2015 for Follow-up   Seen by Anda Kraft last week after LLE wound/cellulitis initially evaluated by [facility] ER 04/19/2015. LLE doppler negative for DVT. Treated with keflex and will complete course today. Also using bactroban ointment TID.  Metformin was never increased last visit.  Saw Dr Richardson Landry ENT - 100% deafness in left ear, decided not to pursue MRI. Pt has option to see audiologist.   Relevant past medical, surgical, family and social history reviewed and updated as indicated. Interim medical history since our last visit reviewed. Allergies and medications reviewed and updated. Current Outpatient Prescriptions on File Prior to Visit  Medication Sig  . acetaminophen (TYLENOL) 500 MG tablet Take 500 mg by mouth 3 (three) times daily.  Marland Kitchen aspirin 81 MG tablet Take 81 mg by mouth daily.   Marland Kitchen atorvastatin (LIPITOR) 20 MG tablet TAKE 1 TABLET NIGHTLY  . BD AUTOSHIELD DUO 30G X 5 MM MISC Use as instructed with insulin Dx: E11.21  . cetirizine (ZYRTEC ALLERGY) 10 MG tablet Take 1 tablet (10 mg total) by mouth daily.  . citalopram (CELEXA) 10 MG tablet TAKE 1 TABLET DAILY  . clopidogrel (PLAVIX) 75 MG tablet TAKE 1 TABLET DAILY  . diphenhydrAMINE (BENADRYL) 25 mg capsule Take 1 capsule (25 mg total) by mouth 3 (three) times daily as needed for itching.  . docusate sodium (COLACE) 100 MG capsule Take 1 capsule (100 mg total) by mouth daily.  . fluticasone (FLONASE) 50 MCG/ACT nasal spray Place 2 sprays into both nostrils daily.  Marland Kitchen glucose blood (FREESTYLE LITE) test strip Use to check sugar three times daily Dx: 250.40  . glucose monitoring kit (FREESTYLE) monitoring kit 1 each by Does not apply route as needed for other.  . Insulin  Glargine (LANTUS SOLOSTAR) 100 UNIT/ML Solostar Pen Inject 10 Units into the skin daily with breakfast.  . isosorbide mononitrate (IMDUR) 30 MG 24 hr tablet Take 1 tablet (30 mg total) by mouth at bedtime.  . Lancets (FREESTYLE) lancets USE TO CHECK SUGAR THREE TIMES A DAY  . LORazepam (ATIVAN) 1 MG tablet Take 1 tablet (1 mg total) by mouth at bedtime. insomnia  . meclizine (ANTIVERT) 25 MG tablet Take 1 tablet (25 mg total) by mouth daily before supper.  . metoprolol tartrate (LOPRESSOR) 25 MG tablet TAKE 1 TABLET TWICE A DAY  . Multiple Vitamin (MULTIVITAMIN) capsule Take 1 capsule by mouth daily.    . mupirocin ointment (BACTROBAN) 2 % Apply to affected area 3 times daily  . nitroGLYCERIN (NITROSTAT) 0.4 MG SL tablet DISSOLVE 1 TABLET UNDER THE TONGUE AS NEEDED  . potassium chloride (K-DUR,KLOR-CON) 10 MEQ tablet Take 2 tablets (20 mEq total) by mouth daily.  . prednisoLONE sodium phosphate (INFLAMASE FORTE) 1 % ophthalmic solution Place 1 drop into both eyes daily.   . RABEprazole (ACIPHEX) 20 MG tablet TAKE 1 TABLET DAILY  . tiotropium (SPIRIVA HANDIHALER) 18 MCG inhalation capsule Place 1 capsule (18 mcg total) into inhaler and inhale daily.  Marland Kitchen torsemide (DEMADEX) 20 MG tablet Take 2 tablets (40 mg total) by mouth as directed. Extra 67m MWF if needed  . traMADol (ULTRAM) 50 MG tablet Take 1 tablet (  50 mg total) by mouth 2 (two) times daily. At 4pm and 10pm  . traZODone (DESYREL) 50 MG tablet TAKE ONE-HALF (1/2) TO 1 TABLET (25 TO 50 MG TOTAL) AT BEDTIME AS NEEDED FOR SLEEP  . Vitamin D, Ergocalciferol, (DRISDOL) 50000 UNITS CAPS capsule TAKE 1 CAPSULE ONCE EVERY WEEK  . Docosanol (ABREVA) 10 % CREA AAA 4-5 times daily until resolved  . fenofibrate 54 MG tablet Take 1 tablet (54 mg total) by mouth daily.  . metFORMIN (GLUCOPHAGE-XR) 500 MG 24 hr tablet Take 2 tablets (1,000 mg total) by mouth at bedtime.   No current facility-administered medications on file prior to visit.    Review  of Systems Per HPI unless specifically indicated in ROS section     Objective:    BP 114/66 mmHg  Pulse 72  Temp(Src) 98.1 F (36.7 C) (Oral)  Wt 133 lb (60.328 kg)  Wt Readings from Last 3 Encounters:  04/30/15 133 lb (60.328 kg)  04/23/15 133 lb 12.8 oz (60.691 kg)  04/19/15 133 lb (60.328 kg)    Physical Exam  Constitutional: She appears well-developed and well-nourished. No distress.  Musculoskeletal: She exhibits edema (minimal).  Diminished pulses LLE 1.3x1.4cm open wound anterior L shin without significant surrounding erythema  Skin: Skin is warm and dry.  Nursing note and vitals reviewed.      Assessment & Plan:   Problem List Items Addressed This Visit    Well controlled type 2 diabetes mellitus with nephropathy ([place])    Blakey Kary did not increase metformin. I have sent another request to increase metformin dose and have asked my CMA to contact [name] to notify.       Leg wound, left - Primary    Cellulitis improving. Measured wound.  Return 1 wk recheck.           Follow up plan: Return in about 1 week (around 05/07/2015) for follow up visit.

## 2015-04-30 NOTE — Progress Notes (Signed)
Pre visit review using our clinic review tool, if applicable. No additional management support is needed unless otherwise documented below in the visit note. 

## 2015-04-30 NOTE — Patient Instructions (Addendum)
New metformin dose is 1000mg  ER nightly. Wound is looking ok. Finish keflex. Continue twice daily dressing changes with antibiotic ointment. Return in 1 week for recheck.

## 2015-04-30 NOTE — Assessment & Plan Note (Signed)
Blakey Podoll did not increase metformin. I have sent another request to increase metformin dose and have asked my CMA to contact [name] to notify.

## 2015-05-02 ENCOUNTER — Telehealth: Payer: Self-pay | Admitting: *Deleted

## 2015-05-02 NOTE — Telephone Encounter (Signed)
Christine Blanchard with pharmacare contacted office stating they received a new Rx 3/29 and that pt was to increase Metformin to 2 tabs 500mg , totaling 1000mg  daily. Christine Blanchard indicates that this is what they have on file for the previous Rx, which started 02/27, and is requesting a call back to clarify.

## 2015-05-02 NOTE — Telephone Encounter (Signed)
Spoke with Vladimir Creeks and advised that patient's MAR from facility showed she was only getting 500 mg daily and patient said she was only getting 500 mg daily, so we re-wrote the order to make sure she was getting what she was supposed to be receiving. She said she would double check with the facility to confirm.

## 2015-05-02 NOTE — Telephone Encounter (Signed)
This is new dose. Patient said she was still only receiving 500mg  once daily.

## 2015-05-07 ENCOUNTER — Ambulatory Visit (INDEPENDENT_AMBULATORY_CARE_PROVIDER_SITE_OTHER): Payer: Medicare Other | Admitting: Family Medicine

## 2015-05-07 ENCOUNTER — Encounter: Payer: Self-pay | Admitting: Family Medicine

## 2015-05-07 VITALS — BP 114/72 | HR 71 | Temp 98.4°F | Wt 133.0 lb

## 2015-05-07 DIAGNOSIS — S81802D Unspecified open wound, left lower leg, subsequent encounter: Secondary | ICD-10-CM | POA: Diagnosis not present

## 2015-05-07 DIAGNOSIS — I25119 Atherosclerotic heart disease of native coronary artery with unspecified angina pectoris: Secondary | ICD-10-CM | POA: Diagnosis not present

## 2015-05-07 NOTE — Progress Notes (Signed)
Pre visit review using our clinic review tool, if applicable. No additional management support is needed unless otherwise documented below in the visit note. 

## 2015-05-07 NOTE — Progress Notes (Signed)
BP 114/72 mmHg  Pulse 71  Temp(Src) 98.4 F (36.9 C) (Tympanic)  Wt 133 lb (60.328 kg)  SpO2 96%   CC: recheck cellulitis  Subjective:    Patient ID: Christine Blanchard, female    DOB: 1928/05/04, 82 y.o.   MRN: [id number]  HPI: Christine Blanchard is a 82 y.o. female presenting on 05/07/2015 for Cellulitis   See prior note for details. Briefly, seen 04/03/2015 for LLE wound which developed cellulitis, treated with keflex course + bactroban ointment TID.   Relevant past medical, surgical, family and social history reviewed and updated as indicated. Interim medical history since our last visit reviewed. Allergies and medications reviewed and updated. Current Outpatient Prescriptions on File Prior to Visit  Medication Sig  . acetaminophen (TYLENOL) 500 MG tablet Take 500 mg by mouth 3 (three) times daily.  Marland Kitchen aspirin 81 MG tablet Take 81 mg by mouth daily.   Marland Kitchen atorvastatin (LIPITOR) 20 MG tablet TAKE 1 TABLET NIGHTLY  . BD AUTOSHIELD DUO 30G X 5 MM MISC Use as instructed with insulin Dx: E11.21  . cetirizine (ZYRTEC ALLERGY) 10 MG tablet Take 1 tablet (10 mg total) by mouth daily.  . citalopram (CELEXA) 10 MG tablet TAKE 1 TABLET DAILY  . clopidogrel (PLAVIX) 75 MG tablet TAKE 1 TABLET DAILY  . diphenhydrAMINE (BENADRYL) 25 mg capsule Take 1 capsule (25 mg total) by mouth 3 (three) times daily as needed for itching.  . Docosanol (ABREVA) 10 % CREA AAA 4-5 times daily until resolved  . docusate sodium (COLACE) 100 MG capsule Take 1 capsule (100 mg total) by mouth daily.  . fenofibrate 54 MG tablet Take 1 tablet (54 mg total) by mouth daily.  . fluticasone (FLONASE) 50 MCG/ACT nasal spray Place 2 sprays into both nostrils daily.  Marland Kitchen glucose blood (FREESTYLE LITE) test strip Use to check sugar three times daily Dx: 250.40  . glucose monitoring kit (FREESTYLE) monitoring kit 1 each by Does not apply route as needed for other.  . Insulin Glargine (LANTUS SOLOSTAR) 100 UNIT/ML Solostar Pen Inject 10  Units into the skin daily with breakfast.  . isosorbide mononitrate (IMDUR) 30 MG 24 hr tablet Take 1 tablet (30 mg total) by mouth at bedtime.  . Lancets (FREESTYLE) lancets USE TO CHECK SUGAR THREE TIMES A DAY  . LORazepam (ATIVAN) 1 MG tablet Take 1 tablet (1 mg total) by mouth at bedtime. insomnia  . meclizine (ANTIVERT) 25 MG tablet Take 1 tablet (25 mg total) by mouth daily before supper.  . metFORMIN (GLUCOPHAGE-XR) 500 MG 24 hr tablet Take 2 tablets (1,000 mg total) by mouth at bedtime.  . metoprolol tartrate (LOPRESSOR) 25 MG tablet TAKE 1 TABLET TWICE A DAY  . Multiple Vitamin (MULTIVITAMIN) capsule Take 1 capsule by mouth daily.    . mupirocin ointment (BACTROBAN) 2 % Apply to affected area 3 times daily  . nitroGLYCERIN (NITROSTAT) 0.4 MG SL tablet DISSOLVE 1 TABLET UNDER THE TONGUE AS NEEDED  . potassium chloride (K-DUR,KLOR-CON) 10 MEQ tablet Take 2 tablets (20 mEq total) by mouth daily.  . prednisoLONE sodium phosphate (INFLAMASE FORTE) 1 % ophthalmic solution Place 1 drop into both eyes daily.   . RABEprazole (ACIPHEX) 20 MG tablet TAKE 1 TABLET DAILY  . tiotropium (SPIRIVA HANDIHALER) 18 MCG inhalation capsule Place 1 capsule (18 mcg total) into inhaler and inhale daily.  Marland Kitchen torsemide (DEMADEX) 20 MG tablet Take 2 tablets (40 mg total) by mouth as directed. Extra 57m MWF if needed  .  traMADol (ULTRAM) 50 MG tablet Take 1 tablet (50 mg total) by mouth 2 (two) times daily. At 4pm and 10pm  . traZODone (DESYREL) 50 MG tablet TAKE ONE-HALF (1/2) TO 1 TABLET (25 TO 50 MG TOTAL) AT BEDTIME AS NEEDED FOR SLEEP  . Vitamin D, Ergocalciferol, (DRISDOL) 50000 UNITS CAPS capsule TAKE 1 CAPSULE ONCE EVERY WEEK   No current facility-administered medications on file prior to visit.    Review of Systems Per HPI unless specifically indicated in ROS section     Objective:    BP 114/72 mmHg  Pulse 71  Temp(Src) 98.4 F (36.9 C) (Tympanic)  Wt 133 lb (60.328 kg)  SpO2 96%  Wt Readings  from Last 3 Encounters:  05/07/15 133 lb (60.328 kg)  04/30/15 133 lb (60.328 kg)  04/23/15 133 lb 12.8 oz (60.691 kg)    Physical Exam  Constitutional: She appears well-developed and well-nourished. No distress.  Musculoskeletal: She exhibits edema (minimal).  1x1cm healing wound anterior L shin with granulation tisue without surrounding erythema  Skin: Skin is warm and dry.  Nursing note and vitals reviewed.     Assessment & Plan:   Problem List Items Addressed This Visit    Leg wound, left - Primary    No longer cellulitis. Wound continues closing. Will continue daily bandaid changes, no need for abx ointment but rather may use vaseline daily. No f/u needed unless worsening again.          Follow up plan: No Follow-up on file.  Ria Bush, MD

## 2015-05-07 NOTE — Patient Instructions (Signed)
Wound is looking great! Continue daily bandaid changes with vaseline or antibiotic ointment you have at home until fully closed.

## 2015-05-07 NOTE — Assessment & Plan Note (Signed)
No longer cellulitis. Wound continues closing. Will continue daily bandaid changes, no need for abx ointment but rather may use vaseline daily. No f/u needed unless worsening again.

## 2015-05-20 ENCOUNTER — Ambulatory Visit: Payer: Medicare Other | Admitting: Internal Medicine

## 2015-05-22 ENCOUNTER — Telehealth: Payer: Self-pay

## 2015-05-22 NOTE — Telephone Encounter (Signed)
Christine Blanchard with [facility] said all she needs is a Public librarian [employer] for test strips. Advised Dr Loura Pardon is [employer] certified and Christine Blanchard said that is all that is needed.

## 2015-05-26 ENCOUNTER — Encounter (INDEPENDENT_AMBULATORY_CARE_PROVIDER_SITE_OTHER): Payer: Self-pay

## 2015-05-26 ENCOUNTER — Ambulatory Visit (INDEPENDENT_AMBULATORY_CARE_PROVIDER_SITE_OTHER): Payer: Medicare Other | Admitting: Cardiovascular Disease

## 2015-05-26 ENCOUNTER — Ambulatory Visit: Payer: Medicare Other | Admitting: Cardiovascular Disease

## 2015-05-26 ENCOUNTER — Encounter: Payer: Self-pay | Admitting: Cardiovascular Disease

## 2015-05-26 VITALS — BP 110/60 | HR 62 | Ht 64.0 in | Wt 133.5 lb

## 2015-05-26 DIAGNOSIS — I25810 Atherosclerosis of coronary artery bypass graft(s) without angina pectoris: Secondary | ICD-10-CM | POA: Diagnosis not present

## 2015-05-26 DIAGNOSIS — J9611 Chronic respiratory failure with hypoxia: Secondary | ICD-10-CM

## 2015-05-26 DIAGNOSIS — I25119 Atherosclerotic heart disease of native coronary artery with unspecified angina pectoris: Secondary | ICD-10-CM

## 2015-05-26 DIAGNOSIS — I5032 Chronic diastolic (congestive) heart failure: Secondary | ICD-10-CM | POA: Diagnosis not present

## 2015-05-26 DIAGNOSIS — I10 Essential (primary) hypertension: Secondary | ICD-10-CM

## 2015-05-26 DIAGNOSIS — E785 Hyperlipidemia, unspecified: Secondary | ICD-10-CM

## 2015-05-26 DIAGNOSIS — R110 Nausea: Secondary | ICD-10-CM

## 2015-05-26 DIAGNOSIS — E1121 Type 2 diabetes mellitus with diabetic nephropathy: Secondary | ICD-10-CM

## 2015-05-26 NOTE — Assessment & Plan Note (Signed)
Chronic nausea, Etiology unclear. Possibly gastroparesis Long discussion with her, less likely CHF exacerbation

## 2015-05-26 NOTE — Assessment & Plan Note (Signed)
Blood pressure is well controlled on today's visit. No changes made to the medications. 

## 2015-05-26 NOTE — Assessment & Plan Note (Signed)
Chronic angina symptoms as above per the patient, she reports having to take nitroglycerin when necessary Long discussion about workup, she does not want catheterization

## 2015-05-26 NOTE — Assessment & Plan Note (Signed)
Hemoglobin A1c continues to be mildly elevated She reports recent glucose numbers well-controlled

## 2015-05-26 NOTE — Patient Instructions (Signed)
You are doing well. No medication changes were made.  Please call us if you have new issues that need to be addressed before your next appt.  Your physician wants you to follow-up in: 6 months.  You will receive a reminder letter in the mail two months in advance. If you don't receive a letter, please call our office to schedule the follow-up appointment.   

## 2015-05-26 NOTE — Assessment & Plan Note (Signed)
Significant fibrosis on clinical  exam On chronic oxygen, followed by pulmonary

## 2015-05-26 NOTE — Progress Notes (Signed)
Patient ID: Christine Blanchard, female    DOB: 1928/07/16, 82 y.o.   MRN: [id number]  HPI Comments: Christine Blanchard is a very pleasant 73 -year-old woman with history of coronary artery disease, bypass surgery in 2004, hyperlipidemia, PCI 6 months after her bypass, repeat PCI one year later (She does report having a stroke after her cardiac catheterization), diabetes, hypertension, spinal stenosis,  who currently lives in a nursing home in [place], who has chronic unsteady gait , history of falls,  with chronic shortness of breath on oxygen who presents for routine followup of her shortness of breath, chronic diastolic CHF. Previous Pulmonary workup has revealed restrictive lung disease.  She currently lives at [facility]. She has chronic hypoxemic respiratory failure with both obstructive and restrictive components and respiratory muscle weakness who is oxygen dependent. She presents today for follow-up of her shortness of breath  In follow-up today, her main complaint is GI related. She has anorexia, nausea Able to eat a reasonable breakfast then has no appetite for lunch or dinner, has chronic nausea This is not a new issue. She had complete abdominal ultrasound in early 2016 for similar symptoms, 20 pound weight loss. Symptoms have persisted. She is concerned this could be heart failure. Weight has been stable, she denies any excessive lower extremity edema, abdominal bloating. Consistent with her diuretic regiment. She has general malaise, fatigue. "How long do I have to go on like this?"  Chronic chest pain, takes nitroglycerin periodically on her own Recent echocardiogram reviewed with her showing normal ejection fraction, normal right heart pressures no significant valve disease  She sees pulmonary for her underlying pulmonary issues. On chronic oxygen   She takes torsemide 40 mg daily, extra 20 mg in the afternoon every other day She reports her sugars have been within a reasonable range,  was 100 this morning   EKG on today's visit shows normal sinus rhythm with rate 62 bpm, nonspecific T-wave abnormality  Other past medical history  mechanical fall February 2016 with hip fracture. Initially sent to [facility], transferred to [facility] at the request of family where she underwent hip repair with several screws.  Transfer back to rehabilitation. She is not full weightbearing at this time. Partial weightbearing with a walker. Gait continues to be unsteady.  History of chronic nausea, on Zofran  Anorexia has been going on since November 2015 with significant weight loss since her prior clinic visit.  Previously was on stool softeners, does not taking this on a regular basis.  No falls or syncope.  No regular exercise program.  Denies any cough. On oxygen, level at 2 L even with ambulation.  Previous cardiac CTA in the past for atypical chest pain that showed: Patent SVG to D1 with poor distal runoff,  Occluded LIMA to LAD.  Distal LAD never visualized,  RCA and circumflex patient with multiple 50% or less calcfic Lesions,  Poor distal runoff from stented IM/LAD and small D1 supplied by SVG  Previous  Echocardiogram was essentially normal with normal ejection fraction greater than 70%, diastolic dysfunction, normal right ventricular systolic pressures  Stress test showed no ischemia with ejection fraction greater than 55%   12/2011 In the hospital, total cholesterol 146, LDL 58, HDL 65  Allergies  Allergen Reactions  . Actos [Pioglitazone Hydrochloride] Shortness Of Breath    CHF  . Pioglitazone Shortness Of Breath    CHF REACTION: heart failure  . Atenolol Other (See Comments)    lethargy  . Codeine  REACTION: vomiting  . Eszopiclone Other (See Comments)    Nightmares  . Eszopiclone   . Exenatide Nausea Only  . Morphine     REACTION: nausea  . Sulfa Antibiotics   . Sulfacetamide Sodium   . Vicodin [Hydrocodone-Acetaminophen] Nausea And Vomiting  . Exenatide Nausea Only   . Hydrocodone-Acetaminophen Nausea And Vomiting    Outpatient Encounter Prescriptions as of 05/26/2015  Medication Sig  . acetaminophen (TYLENOL) 500 MG tablet Take 500 mg by mouth 3 (three) times daily.  Marland Kitchen aspirin 81 MG tablet Take 81 mg by mouth daily.   Marland Kitchen atorvastatin (LIPITOR) 20 MG tablet TAKE 1 TABLET NIGHTLY  . BD AUTOSHIELD DUO 30G X 5 MM MISC Use as instructed with insulin Dx: E11.21  . cetirizine (ZYRTEC ALLERGY) 10 MG tablet Take 1 tablet (10 mg total) by mouth daily.  . citalopram (CELEXA) 10 MG tablet TAKE 1 TABLET DAILY  . clopidogrel (PLAVIX) 75 MG tablet TAKE 1 TABLET DAILY  . diphenhydrAMINE (BENADRYL) 25 mg capsule Take 1 capsule (25 mg total) by mouth 3 (three) times daily as needed for itching.  . Docosanol (ABREVA) 10 % CREA AAA 4-5 times daily until resolved  . docusate sodium (COLACE) 100 MG capsule Take 1 capsule (100 mg total) by mouth daily.  . fluticasone (FLONASE) 50 MCG/ACT nasal spray Place 2 sprays into both nostrils daily.  Marland Kitchen glucose blood (FREESTYLE LITE) test strip Use to check sugar three times daily Dx: 250.40  . glucose monitoring kit (FREESTYLE) monitoring kit 1 each by Does not apply route as needed for other.  . Insulin Glargine (LANTUS SOLOSTAR) 100 UNIT/ML Solostar Pen Inject 10 Units into the skin daily with breakfast.  . isosorbide mononitrate (IMDUR) 30 MG 24 hr tablet Take 1 tablet (30 mg total) by mouth at bedtime.  . Lancets (FREESTYLE) lancets USE TO CHECK SUGAR THREE TIMES A DAY  . LORazepam (ATIVAN) 1 MG tablet Take 1 tablet (1 mg total) by mouth at bedtime. insomnia  . meclizine (ANTIVERT) 25 MG tablet Take 1 tablet (25 mg total) by mouth daily before supper.  . metFORMIN (GLUCOPHAGE-XR) 500 MG 24 hr tablet Take 2 tablets (1,000 mg total) by mouth at bedtime.  . metoprolol tartrate (LOPRESSOR) 25 MG tablet TAKE 1 TABLET TWICE A DAY  . Multiple Vitamin (MULTIVITAMIN) capsule Take 1 capsule by mouth daily.    . mupirocin ointment  (BACTROBAN) 2 % Apply to affected area 3 times daily  . nitroGLYCERIN (NITROSTAT) 0.4 MG SL tablet DISSOLVE 1 TABLET UNDER THE TONGUE AS NEEDED  . potassium chloride (K-DUR,KLOR-CON) 10 MEQ tablet Take 2 tablets (20 mEq total) by mouth daily.  . prednisoLONE sodium phosphate (INFLAMASE FORTE) 1 % ophthalmic solution Place 1 drop into both eyes daily.   . RABEprazole (ACIPHEX) 20 MG tablet TAKE 1 TABLET DAILY  . tiotropium (SPIRIVA HANDIHALER) 18 MCG inhalation capsule Place 1 capsule (18 mcg total) into inhaler and inhale daily.  Marland Kitchen torsemide (DEMADEX) 20 MG tablet Take 2 tablets (40 mg total) by mouth as directed. Extra 53m MWF if needed  . traMADol (ULTRAM) 50 MG tablet Take 1 tablet (50 mg total) by mouth 2 (two) times daily. At 4pm and 10pm  . traZODone (DESYREL) 50 MG tablet TAKE ONE-HALF (1/2) TO 1 TABLET (25 TO 50 MG TOTAL) AT BEDTIME AS NEEDED FOR SLEEP  . Vitamin D, Ergocalciferol, (DRISDOL) 50000 UNITS CAPS capsule TAKE 1 CAPSULE ONCE EVERY WEEK  . [DISCONTINUED] fenofibrate 54 MG tablet Take 1 tablet (54  mg total) by mouth daily. (Patient not taking: Reported on 05/26/2015)   No facility-administered encounter medications on file as of 05/26/2015.    Past Medical History  Diagnosis Date  . Hypertension   . Diabetes mellitus 2003    previously saw Dr. Elyse Hsu  . Hyperlipidemia   . History of chicken pox     has had shingles shot  . History of diverticulitis of colon 1991, 1992, 1994  . History of kidney stones 1952  . CVA (cerebral infarction)     after a stent, minimal R residual weakness, affects speech when tired, ?mild dysequilibrium  . Urinary incontinence     stress/urge, per D. Tannenbaum/[place]  . CAD (coronary artery disease)     severe, s/p 4 cardiac caths, minimally invasive CABG @[facility] , ICA stent 2004 then LAD stent 2005, MEDICAL MANAGEMENT ONLY, MAY USE REPEATED SL NITRO  . Breathing difficulty 2011    on O2 since 10/2009  . Syncope 2005    s/p w/u including CT  scan and 24 hour Holter  . GERD (gastroesophageal reflux disease) 2004  . Dry senile macular degeneration     s/p corneal transplants ([facility])  . Benign essential tremor     toprol XL and remeron  . Herpes zoster ophthalmicus 2009    history  . Hearing loss of both ears 08/2010    audiology eval 08/2010 - mod sensorineural heaing loss, rec trial digital hearing aids and re eval yearly to monitor  . Vitamin D deficiency     on 50k units weekly  . Diastolic CHF ([place]) 0/8676    grade 1, normal EF, mildly dilated LA, normal PA pressures  . Lumbar spinal stenosis     with severe spondylosis, s/p laminotomy  . Allergic rhinitis   . Chronic kidney disease     stage 3 kidney disease.  . Leg weakness, bilateral 2014    s/p neuro eval - thought multifactorial (diabetic neuropathy, lumbar stenosis, and chronic disease deconditioning).  Conservative management recommended  . Gait instability   . Wedge compression fracture of T10 vertebra ([place]) 2015    by CXR  . Fracture of femoral neck, right ([place]) 03/2014    s/p surgery  . Squamous cell skin cancer, upper arm 03/2015    R upper arm Nehemiah Massed)    Past Surgical History  Procedure Laterality Date  . Cholecystectomy  1985  . Cervical fusion  2002    anterior decompression and fusion C6/7  . Coronary artery bypass graft  2004  . Percutaneous coronary stent intervention (pci-s)  2005    stents x 2 (intermediate and LAD), 2nd complicated by stroke  . Corneal transplant      x3  . Appendectomy  1948  . Breast biopsy  2001    x2, both benign, last mammo 2011, last pap smear 2011  . Cardiac catheterization  2009    x5  . Cataract extraction      bilateral, corneal dystrophy  . Cardiovascular stress test  2010    nuclear, normal  . Dexa  2005    normal  . Lumbar spine surgery  2010    laminotomy (L2/3, 3/4)  . Hip fracture surgery Right 03/2014    closed reduction percutaneous pinning of R femoral neck fracture ([place], [facility])     Social History  reports that she has never smoked. She has never used smokeless tobacco. She reports that she does not drink alcohol or use illicit drugs.  Family History family  history includes Cancer in her brother and mother; Heart disease in her father.   Review of Systems  Constitutional: Negative.        General malaise  Respiratory: Positive for shortness of breath.   Cardiovascular: Negative.   Gastrointestinal: Positive for nausea.  Musculoskeletal: Positive for gait problem.  Neurological: Negative.   Hematological: Negative.   Psychiatric/Behavioral: Negative.   All other systems reviewed and are negative.   BP 110/60 mmHg  Pulse 62  Ht 5' 4"  (1.626 m)  Wt 133 lb 8 oz (60.555 kg)  BMI 22.90 kg/m2  Physical Exam  Constitutional: She is oriented to person, place, and time. She appears well-developed and well-nourished.  HENT:  Head: Normocephalic.  Nose: Nose normal.  Mouth/Throat: Oropharynx is clear and moist.  Eyes: Conjunctivae are normal. Pupils are equal, round, and reactive to light.  Neck: Normal range of motion. Neck supple. No JVD present.  Cardiovascular: Normal rate, regular rhythm, S1 normal, S2 normal and intact distal pulses.  Exam reveals no gallop and no friction rub.   Murmur heard.  Crescendo systolic murmur is present with a grade of 2/6  Trace pitting edema around the ankles, low shins  Pulmonary/Chest: Effort normal. No respiratory distress. She has decreased breath sounds. She has no wheezes. She has rales. She exhibits no tenderness.  Abdominal: Soft. Bowel sounds are normal. She exhibits no distension. There is no tenderness.  Musculoskeletal: Normal range of motion. She exhibits no edema or tenderness.  Lymphadenopathy:    She has no cervical adenopathy.  Neurological: She is alert and oriented to person, place, and time. Coordination normal.  Skin: Skin is warm and dry. No rash noted. No erythema.  Psychiatric: She has a normal  mood and affect. Her behavior is normal. Judgment and thought content normal.    Assessment and Plan  Nursing note and vitals reviewed.

## 2015-05-26 NOTE — Assessment & Plan Note (Signed)
She reports having chronic angina relieved with nitroglycerin sublingual. Long discussion concerning other workup including stress test, catheterization. She does not want catheterization ever given prior history of stroke Recommended that she continue her current medications

## 2015-05-26 NOTE — Assessment & Plan Note (Signed)
Cholesterol close to goal one year ago, encouraged her to continue on her current medications. Repeat lipid panel when she sees primary care. Potentially could increase the dose of her Lipitor if needed.   Total encounter time more than 25 minutes  Greater than 50% was spent in counseling and coordination of care with the patient

## 2015-05-26 NOTE — Assessment & Plan Note (Signed)
Weight is approximately the same, possibly up 3 pounds Minimal leg edema on today's visit, compliant with her diuretics Discussed with her that symptoms of anorexia, nausea are less likely from heart failure No changes to her medications

## 2015-05-28 DIAGNOSIS — L821 Other seborrheic keratosis: Secondary | ICD-10-CM | POA: Diagnosis not present

## 2015-05-28 DIAGNOSIS — D692 Other nonthrombocytopenic purpura: Secondary | ICD-10-CM | POA: Diagnosis not present

## 2015-05-28 DIAGNOSIS — C44622 Squamous cell carcinoma of skin of right upper limb, including shoulder: Secondary | ICD-10-CM | POA: Diagnosis not present

## 2015-06-02 ENCOUNTER — Encounter: Payer: Self-pay | Admitting: Podiatry

## 2015-06-02 ENCOUNTER — Ambulatory Visit (INDEPENDENT_AMBULATORY_CARE_PROVIDER_SITE_OTHER): Payer: Medicare Other | Admitting: Podiatry

## 2015-06-02 DIAGNOSIS — B351 Tinea unguium: Secondary | ICD-10-CM | POA: Diagnosis not present

## 2015-06-02 DIAGNOSIS — M79676 Pain in unspecified toe(s): Secondary | ICD-10-CM | POA: Diagnosis not present

## 2015-06-02 NOTE — Progress Notes (Signed)
She presents today with chief complaint of painful elongated toenails.  Objective: Vital signs are stable alert and oriented 3. Pulses are strongly palpable. Toenails are thick yellow dystrophic onychomycotic and painful palpation  Assessment: Pain limb secondary to onychomycosis.  Plan: Debridement of toenails 1 through 5 bilateral.

## 2015-06-13 ENCOUNTER — Other Ambulatory Visit: Payer: Self-pay | Admitting: Family Medicine

## 2015-06-16 ENCOUNTER — Telehealth: Payer: Self-pay

## 2015-06-16 NOTE — Telephone Encounter (Signed)
Christine Blanchard at [facility] said that pt thinks she is supposed to take an extra torsemide 20 mg on MWF as scheduled med, not prn. Med list has to take the extra torsemide 20 mg on MWF if needed. Pt not having problem with swelling. Christine Blanchard request cb.

## 2015-06-16 NOTE — Telephone Encounter (Signed)
If weight stable, no dyspnea or leg swelling, would leave as is - 40mg  toresmide daily, with extra tablet on MWF as needed for swelling/dyspnea.

## 2015-06-17 NOTE — Telephone Encounter (Signed)
Notified Kristen at [facility] and order faxed as requested.

## 2015-06-24 ENCOUNTER — Ambulatory Visit (INDEPENDENT_AMBULATORY_CARE_PROVIDER_SITE_OTHER): Payer: Medicare Other | Admitting: Family Medicine

## 2015-06-24 ENCOUNTER — Encounter: Payer: Self-pay | Admitting: Family Medicine

## 2015-06-24 VITALS — BP 126/74 | HR 72 | Temp 98.2°F | Wt 136.0 lb

## 2015-06-24 DIAGNOSIS — I10 Essential (primary) hypertension: Secondary | ICD-10-CM | POA: Diagnosis not present

## 2015-06-24 DIAGNOSIS — E1121 Type 2 diabetes mellitus with diabetic nephropathy: Secondary | ICD-10-CM | POA: Diagnosis not present

## 2015-06-24 DIAGNOSIS — I25810 Atherosclerosis of coronary artery bypass graft(s) without angina pectoris: Secondary | ICD-10-CM

## 2015-06-24 DIAGNOSIS — R110 Nausea: Secondary | ICD-10-CM

## 2015-06-24 NOTE — Progress Notes (Signed)
Pre visit review using our clinic review tool, if applicable. No additional management support is needed unless otherwise documented below in the visit note. 

## 2015-06-24 NOTE — Assessment & Plan Note (Signed)
Chronic, stable. Continue current regimen. 

## 2015-06-24 NOTE — Assessment & Plan Note (Addendum)
Ongoing chronic nausea. Not thought CHF related. Previous lab work unrevealing. Meclizine works better than zofran, now meclizine scheduled at 4pm daily and working overall well. Will add AM meclizine PRN. ?Gastorparesis but hesitant to trial med for this given heavy side effect profile.

## 2015-06-24 NOTE — Assessment & Plan Note (Signed)
Pt endorses improved readings over last few months with higher metformin dosing. Will check A1c next visit.

## 2015-06-24 NOTE — Patient Instructions (Addendum)
Return in 3 months for early appointment with fasting labs on same day for medicare wellness visit Continue current medicines.  Try meclizine 25mg  extra tablet in the morning as needed.

## 2015-06-24 NOTE — Progress Notes (Signed)
BP 126/74 mmHg  Pulse 72  Temp(Src) 98.2 F (36.8 C) (Oral)  Wt 136 lb (61.689 kg) on 2L by [place]  CC: 3 mo f/u visit Subjective:    Patient ID: Christine Blanchard, female    DOB: 02-Jul-1928, 82 y.o.   MRN: [id number]  HPI: Christine Blanchard is a 82 y.o. female presenting on 06/24/2015 for Follow-up   She did have breakfast at 8am this morning. She is taking 88m toresmide daily, with extra tablet on MWF as needed for swelling/dyspnea.  "Having a good day today but I've been having many bad days" - nausea, loss of appetite, weakness. Dr GRockey Situdid not feel this was CHF related. Easily fatigued. She is taking meclizine 239mat 4pm.   DM - regularly does check sugars - patient endorsed running well. Few lows to 70 due to lack of appetite. Compliant with antihyperglycemic regimen which includes: lantus 10u daily with breakfast, metformin 24hr 100064mt bedtime. Denies paresthesias. Last diabetic eye exam 03/2015 without retinopathy.  Pneumovax: 2009.  Prevnar: 2015. Lab Results  Component Value Date   HGBA1C 7.8* 03/27/2015   Diabetic Foot Exam - Simple   No data filed    not done.    Relevant past medical, surgical, family and social history reviewed and updated as indicated. Interim medical history since our last visit reviewed. Allergies and medications reviewed and updated. Current Outpatient Prescriptions on File Prior to Visit  Medication Sig  . acetaminophen (TYLENOL) 500 MG tablet Take 500 mg by mouth 3 (three) times daily.  . aMarland Kitchenpirin 81 MG tablet Take 81 mg by mouth daily.   . aMarland Kitchenorvastatin (LIPITOR) 20 MG tablet TAKE 1 TABLET NIGHTLY  . BD AUTOSHIELD DUO 30G X 5 MM MISC Use as instructed with insulin Dx: E11.21  . cetirizine (ZYRTEC ALLERGY) 10 MG tablet Take 1 tablet (10 mg total) by mouth daily.  . citalopram (CELEXA) 10 MG tablet TAKE 1 TABLET DAILY  . clopidogrel (PLAVIX) 75 MG tablet TAKE 1 TABLET DAILY  . diphenhydrAMINE (BENADRYL) 25 mg capsule Take 1 capsule (25 mg  total) by mouth 3 (three) times daily as needed for itching.  . docusate sodium (COLACE) 100 MG capsule Take 1 capsule (100 mg total) by mouth daily.  . fluticasone (FLONASE) 50 MCG/ACT nasal spray Place 2 sprays into both nostrils daily.  . gMarland Kitchenucose blood (FREESTYLE LITE) test strip Use to check sugar three times daily Dx: 250.40  . glucose monitoring kit (FREESTYLE) monitoring kit 1 each by Does not apply route as needed for other.  . Insulin Glargine (LANTUS SOLOSTAR) 100 UNIT/ML Solostar Pen Inject 10 Units into the skin daily with breakfast.  . isosorbide mononitrate (IMDUR) 30 MG 24 hr tablet Take 1 tablet (30 mg total) by mouth at bedtime.  . Lancets (FREESTYLE) lancets USE TO CHECK SUGAR THREE TIMES A DAY  . LORazepam (ATIVAN) 1 MG tablet Take 1 tablet (1 mg total) by mouth at bedtime. insomnia  . metFORMIN (GLUCOPHAGE-XR) 500 MG 24 hr tablet Take 2 tablets (1,000 mg total) by mouth at bedtime.  . metoprolol tartrate (LOPRESSOR) 25 MG tablet TAKE 1 TABLET TWICE A DAY  . Multiple Vitamin (MULTIVITAMIN) capsule Take 1 capsule by mouth daily.    . nitroGLYCERIN (NITROSTAT) 0.4 MG SL tablet DISSOLVE 1 TABLET UNDER THE TONGUE AS NEEDED  . potassium chloride (K-DUR,KLOR-CON) 10 MEQ tablet Take 2 tablets (20 mEq total) by mouth daily.  . prednisoLONE sodium phosphate (INFLAMASE FORTE) 1 % ophthalmic solution Place  1 drop into both eyes daily.   . RABEprazole (ACIPHEX) 20 MG tablet TAKE 1 TABLET DAILY  . tiotropium (SPIRIVA HANDIHALER) 18 MCG inhalation capsule Place 1 capsule (18 mcg total) into inhaler and inhale daily.  Marland Kitchen torsemide (DEMADEX) 20 MG tablet Take 2 tablets (40 mg total) by mouth as directed. Extra 32m MWF if needed  . traMADol (ULTRAM) 50 MG tablet Take 1 tablet (50 mg total) by mouth 2 (two) times daily. At 4pm and 10pm  . traZODone (DESYREL) 50 MG tablet TAKE ONE-HALF (1/2) TO 1 TABLET (25 TO 50 MG TOTAL) AT BEDTIME AS NEEDED FOR SLEEP  . Vitamin D, Ergocalciferol, (DRISDOL)  50000 UNITS CAPS capsule TAKE 1 CAPSULE ONCE EVERY WEEK   No current facility-administered medications on file prior to visit.    Review of Systems Per HPI unless specifically indicated in ROS section     Objective:    BP 126/74 mmHg  Pulse 72  Temp(Src) 98.2 F (36.8 C) (Oral)  Wt 136 lb (61.689 kg)  Wt Readings from Last 3 Encounters:  06/24/15 136 lb (61.689 kg)  05/26/15 133 lb 8 oz (60.555 kg)  05/07/15 133 lb (60.328 kg)    Physical Exam  Constitutional: She appears well-developed and well-nourished. No distress.  Supplemental O2  HENT:  Mouth/Throat: Oropharynx is clear and moist. No oropharyngeal exudate.  Cardiovascular: Normal rate, regular rhythm, normal heart sounds and intact distal pulses.   No murmur heard. Pulmonary/Chest: Effort normal and breath sounds normal. No respiratory distress. She has no wheezes. She has no rales.  Some crackles but clear with deep breathing  Musculoskeletal: She exhibits no edema.  Skin: Skin is warm and dry. No rash noted.  Psychiatric: She has a normal mood and affect.  Nursing note and vitals reviewed.  Results for orders placed or performed during the hospital encounter of 04/19/15  CBC with Differential  Result Value Ref Range   WBC 9.2 3.6 - 11.0 K/uL   RBC 4.52 3.80 - 5.20 MIL/uL   Hemoglobin 11.7 (L) 12.0 - 16.0 g/dL   HCT 36.5 35.0 - 47.0 %   MCV 80.6 80.0 - 100.0 fL   MCH 26.0 26.0 - 34.0 pg   MCHC 32.2 32.0 - 36.0 g/dL   RDW 15.9 (H) 11.5 - 14.5 %   Platelets 194 150 - 440 K/uL   Neutrophils Relative % 49 %   Neutro Abs 4.6 1.4 - 6.5 K/uL   Lymphocytes Relative 35 %   Lymphs Abs 3.3 1.0 - 3.6 K/uL   Monocytes Relative 11 %   Monocytes Absolute 1.0 (H) 0.2 - 0.9 K/uL   Eosinophils Relative 4 %   Eosinophils Absolute 0.3 0 - 0.7 K/uL   Basophils Relative 1 %   Basophils Absolute 0.1 0 - 0.1 K/uL  Basic metabolic panel  Result Value Ref Range   Sodium 138 135 - 145 mmol/L   Potassium 3.4 (L) 3.5 - 5.1  mmol/L   Chloride 97 (L) 101 - 111 mmol/L   CO2 34 (H) 22 - 32 mmol/L   Glucose, Bld 185 (H) 65 - 99 mg/dL   BUN 15 6 - 20 mg/dL   Creatinine, Ser 0.64 0.44 - 1.00 mg/dL   Calcium 9.0 8.9 - 10.3 mg/dL   GFR calc non Af Amer >60 >60 mL/min   GFR calc Af Amer >60 >60 mL/min   Anion gap 7 5 - 15      Assessment & Plan:   Problem List Items  Addressed This Visit    Well controlled type 2 diabetes mellitus with nephropathy ([place]) - Primary    Pt endorses improved readings over last few months with higher metformin dosing. Will check A1c next visit.      Essential hypertension    Chronic, stable. Continue current regimen.      Nausea    Ongoing chronic nausea. Not thought CHF related. Previous lab work unrevealing. Meclizine works better than zofran, now meclizine scheduled at 4pm daily and working overall well. Will add AM meclizine PRN. ?Gastorparesis but hesitant to trial med for this given heavy side effect profile.           Follow up plan: Return in about 3 months (around 09/24/2015) for medicare wellness visit.  Ria Bush, MD

## 2015-07-21 ENCOUNTER — Other Ambulatory Visit: Payer: Self-pay | Admitting: *Deleted

## 2015-07-21 NOTE — Telephone Encounter (Signed)
Ok to refill 90 days to mail order? Will need written Rx.

## 2015-07-22 MED ORDER — TRAMADOL HCL 50 MG PO TABS: 50.0000 mg | ORAL_TABLET | Freq: Two times a day (BID) | ORAL | Status: DC

## 2015-07-22 NOTE — Telephone Encounter (Signed)
Would you be willing to print and sign this?

## 2015-07-22 NOTE — Telephone Encounter (Signed)
Ok to print 90d supply - if provider in office is willing - #[phone] [id number]

## 2015-07-22 NOTE — Telephone Encounter (Signed)
Rx faxed to mail order 

## 2015-08-13 ENCOUNTER — Telehealth: Payer: Self-pay

## 2015-08-13 NOTE — Telephone Encounter (Signed)
Sula Rumple form [place] called to find out if we have received an order for Vit D 50,000u for the patient. She would like to be called back and advised whether we have it or not.

## 2015-08-14 NOTE — Telephone Encounter (Signed)
Ok to do. Thanks. Take weekly, #12 with RF 1

## 2015-08-14 NOTE — Telephone Encounter (Signed)
I called [name]. They said they need an order faxed approving Vit D 50,000 once a week. Fax to [phone]

## 2015-08-14 NOTE — Telephone Encounter (Signed)
I did not see an order in Kim's InBox or on her desk. Dr Danise Mina, have you seen an order for this?

## 2015-08-14 NOTE — Telephone Encounter (Signed)
Have not received yet

## 2015-08-15 ENCOUNTER — Other Ambulatory Visit: Payer: Self-pay

## 2015-08-15 ENCOUNTER — Other Ambulatory Visit: Payer: Self-pay | Admitting: Family Medicine

## 2015-08-15 MED ORDER — LORAZEPAM 1 MG PO TABS: 1.0000 mg | ORAL_TABLET | Freq: Every day | ORAL | Status: DC

## 2015-08-15 MED ORDER — VITAMIN D (ERGOCALCIFEROL) 1.25 MG (50000 UNIT) PO CAPS: ORAL_CAPSULE | ORAL | Status: DC

## 2015-08-15 NOTE — Telephone Encounter (Signed)
Printed and in Kim's box. plz fax in.  We never received request.

## 2015-08-15 NOTE — Telephone Encounter (Signed)
Pt called. She said [facility] have been waiting for 10 days for a lorazapam 1mg  refill. Please advise.  Pt has 7 pills left

## 2015-08-15 NOTE — Telephone Encounter (Signed)
Last filled on 02/06/15 #90 +1, last OV 04/05/017. Ok to refill?

## 2015-08-15 NOTE — Telephone Encounter (Signed)
rx was faxed.

## 2015-08-16 NOTE — Telephone Encounter (Signed)
Received call from Sonia Side at [facility] confirming patient to be maintained on Vitamin D 50000 IU caps weekly. Reviewed chart and confirmed patient to maintain once weekly dosing

## 2015-08-18 ENCOUNTER — Telehealth: Payer: Self-pay

## 2015-08-18 NOTE — Telephone Encounter (Signed)
PLEASE NOTE: All timestamps contained within this report are represented as [place] Standard Time. CONFIDENTIALTY NOTICE: This fax transmission is intended only for the addressee. It contains information that is legally privileged, confidential or otherwise protected from use or disclosure. If you are not the intended recipient, you are strictly prohibited from reviewing, disclosing, copying using or disseminating any of this information or taking any action in reliance on or regarding this information. If you have received this fax in error, please notify us immediately by telephone so that we can arrange for its return to [place]. Phone: [phone], Toll-Free: [phone], Fax: [phone] Page: 1 of 1 Call Id: [id number] [place] Night - Client Nonclinical Telephone Record [place] Night - Client Client Site [place] Physician Ria Bush - MD Contact Type Call Who Is Calling Physician / Provider / Hospital Call Type Provider Call [facility] Page Now Reason for Call Request to speak to Physician Initial Comment PGD @ 11:32 Caller states Sonia Side from [facility]. Faxed and called about pt being on a vitamin. Need to know if pt is on this medication. Additional Comment Patient Name Christine Blanchard Patient DOB [birth year] Requesting Provider [facility] Physician Number [place] Name [facility] Paging DoctorName Phone DateTime Result/Outcome Message Type Notes Penni Homans - MD [id number] 08/16/2015 11:32:15 AM Paged On Call Back to Call Center Doctor Paged Please call the answering service at [phone]. Penni Homans - MD 08/16/2015 11:45:04 AM Spoke with On Call - General Message Result Answering service returned call to get information. Call Closed By: Dalia Heading Transaction Date/Time: 08/16/2015 11:17:21 AM (ET)

## 2015-08-18 NOTE — Telephone Encounter (Signed)
PLEASE NOTE: All timestamps contained within this report are represented as [place] Standard Time. CONFIDENTIALTY NOTICE: This fax transmission is intended only for the addressee. It contains information that is legally privileged, confidential or otherwise protected from use or disclosure. If you are not the intended recipient, you are strictly prohibited from reviewing, disclosing, copying using or disseminating any of this information or taking any action in reliance on or regarding this information. If you have received this fax in error, please notify us immediately by telephone so that we can arrange for its return to [place]. Phone: [phone], Toll-Free: [phone], Fax: [phone] Page: 1 of 1 Call Id: [id number] [place] Day - Client Nonclinical Telephone Record [place] Day - Client Client Site [place] Physician Ria Bush - MD Contact Type Call Who Is Calling Physician / Provider / Hospital Call Type Provider Call Message Only Reason for Call Request to speak to Physician Initial Comment Caller states [facility]. Faxed and called about pt being on a vitamin. Need to know if the patient is on the medication. Patient Name Christine Blanchard Patient DOB 1928/09/20 Requesting Provider [facility] Physician Number [place] Name [facility] Call Closed By: Pearline Cables Transaction Date/Time: 08/16/2015 11:08:10 AM (ET)

## 2015-08-18 NOTE — Telephone Encounter (Signed)
We faxed in vit D 50,000 IU weekly #12 RF1 last week. Can we call pt or Sonia Side from [facility] to see if this has been taken care of?  Thanks.

## 2015-08-19 NOTE — Telephone Encounter (Signed)
Lorazepam Rx faxed on 08/15/15 per [place].

## 2015-08-20 ENCOUNTER — Other Ambulatory Visit: Payer: Self-pay | Admitting: Family Medicine

## 2015-08-20 NOTE — Telephone Encounter (Signed)
Spoke with [name] and everything has been handled.

## 2015-08-24 ENCOUNTER — Other Ambulatory Visit: Payer: Self-pay | Admitting: Cardiovascular Disease

## 2015-08-24 ENCOUNTER — Other Ambulatory Visit: Payer: Self-pay | Admitting: Family Medicine

## 2015-08-25 ENCOUNTER — Other Ambulatory Visit: Payer: Self-pay | Admitting: Family Medicine

## 2015-08-25 NOTE — Telephone Encounter (Signed)
Ok to refill 

## 2015-08-25 NOTE — Telephone Encounter (Signed)
Opened in error

## 2015-08-28 ENCOUNTER — Telehealth: Payer: Self-pay | Admitting: *Deleted

## 2015-08-28 DIAGNOSIS — L570 Actinic keratosis: Secondary | ICD-10-CM | POA: Diagnosis not present

## 2015-08-28 DIAGNOSIS — L820 Inflamed seborrheic keratosis: Secondary | ICD-10-CM | POA: Diagnosis not present

## 2015-08-28 DIAGNOSIS — Z85828 Personal history of other malignant neoplasm of skin: Secondary | ICD-10-CM | POA: Diagnosis not present

## 2015-08-28 DIAGNOSIS — L821 Other seborrheic keratosis: Secondary | ICD-10-CM | POA: Diagnosis not present

## 2015-08-28 DIAGNOSIS — L578 Other skin changes due to chronic exposure to nonionizing radiation: Secondary | ICD-10-CM | POA: Diagnosis not present

## 2015-08-28 NOTE — Telephone Encounter (Signed)
Fax from [facility] advising the Metformin ER 500mg  is temporarily out of stock and brand name glucophage isn't available either. Will need substitute med.

## 2015-08-29 ENCOUNTER — Other Ambulatory Visit: Payer: Self-pay | Admitting: Internal Medicine

## 2015-09-03 MED ORDER — METFORMIN HCL 500 MG PO TABS: 500.0000 mg | ORAL_TABLET | Freq: Two times a day (BID) | ORAL | 0 refills | Status: DC

## 2015-09-03 NOTE — Telephone Encounter (Signed)
Will send in metformin IR temporarily

## 2015-09-09 NOTE — Progress Notes (Signed)
* [facility]     Assessment and Plan:  Dyspnea -Multifactorial causes of dyspnea including restrictive and obstructive lung disease, chronic angina, deconditioning and likely some degree of chronic aspiration.   Bronchiectasis. Emphysema. -CT chest from 11/01/2014 shows some mild subpleural fibrosis, bronchiectasis, and anatomical emphysema. Continue oxygen and activity.  --Continue spiriva.   Chronic hypoxemic respiratory failure --Continuous O2 at 2L/min. --Suspect that this is multifactorial due to combined pulmonary disease, cardiac disease, debility and deconditioning.   --Continue activity as tolerated.  --Will start nebulized albuterol three times daily before walking to dining [place], this may improve her breathing.    Congestive heart failure --Chronic congestive heart failure and volume overload with CAD, she is following with cardiology.  --Continue current cardiac regimen and diuretic.    Rhinitis -Continue flonase.    She reinterates today that her advanced directive states DO NOT RESUSCITATE.     Date: 09/09/2015  MRN# [id number] Christine Blanchard 01/11/1929   Christine Blanchard is a 82 y.o. old female seen in follow up for chief complaint of  Chief Complaint  Patient presents with  . Follow-up    41morov. breathing is up & down. pt c/o cont sob w/exertion & occ non prod cough. on 2L 02     HPI:  Christine Blanchard an 82year old female with chronic dyspnea. She has both obstructive and restrictive lung disease and possibly some degree of chronic aspiration with early changes of pulmonary fibrosis. Her course is also complicated by debility and deconditioning, she lives in a nursing home. She continues on 2 L of oxygen, she ambulates with a walker.She lives in assisted living.  She notes today that her breathing is about the same. She has to sit when walking to the dining area. She continues on 2 L of oxygen. She feels that her health has declined  since her last visit, she has been having more angina pain, and has been taking a nitro daily. She breathing has declined, she can not make it to the lunch room without having to sit down and rest. She is cheery today but says that her life has been "boring" because she can not participate in activities. She continues to use spiriva and flonase once daily.    CT 11/01/2014: No lung nodule was seen, there is early changes of pulmonary fibrosis seen in the subpleural areas, there is some of the symptoms, changes, as well as a areas of bronchiectasis. There is also left elevated diaphragm visible.  06/2011 6MW 725 ft., O2 98% 2L [place] HR peack 90  SIX MIN WALK 03/14/2015 10/17/2013 04/25/2013 10/17/2012 11/03/2011 06/15/2010  Supplimental Oxygen during Test? (L/min) No Yes Yes Yes Yes No  O2 Flow Rate - _0 -  Type - Continuous Continuous Pulse Pulse -  Tech Comments: - - Could not complete lap 3 due to leg weakness. - After walking approx 200 ft o2 sat was 93%2lpm and pulse was 80/walk stopped due to weakness//lmr pt recovered to 93% 2L with exertion    Medication:   Outpatient Encounter Prescriptions as of 09/11/2015  Medication Sig  . acetaminophen (TYLENOL) 500 MG tablet Take 500 mg by mouth 3 (three) times daily.  .Marland Kitchenaspirin 81 MG tablet Take 81 mg by mouth daily.   .Marland Kitchenatorvastatin (LIPITOR) 20 MG tablet TAKE 1 TABLET NIGHTLY  . BD AUTOSHIELD DUO 30G X 5 MM MISC Use as instructed with insulin Dx: E11.21  . cetirizine (ZYRTEC ALLERGY) 10  MG tablet Take 1 tablet (10 mg total) by mouth daily.  . citalopram (CELEXA) 10 MG tablet TAKE 1 TABLET DAILY  . clopidogrel (PLAVIX) 75 MG tablet TAKE 1 TABLET DAILY  . diphenhydrAMINE (BENADRYL) 25 mg capsule Take 1 capsule (25 mg total) by mouth 3 (three) times daily as needed for itching.  . docusate sodium (COLACE) 100 MG capsule Take 1 capsule (100 mg total) by mouth daily.  . fluticasone (FLONASE) 50 MCG/ACT nasal spray Place 2 sprays into both nostrils  daily.  Marland Kitchen glucose blood (FREESTYLE LITE) test strip Use to check sugar three times daily Dx: 250.40  . glucose monitoring kit (FREESTYLE) monitoring kit 1 each by Does not apply route as needed for other.  . Insulin Glargine (LANTUS SOLOSTAR) 100 UNIT/ML Solostar Pen Inject 10 Units into the skin daily with breakfast.  . isosorbide mononitrate (IMDUR) 30 MG 24 hr tablet Take 1 tablet (30 mg total) by mouth at bedtime.  . Lancets (FREESTYLE) lancets USE TO CHECK SUGAR THREE TIMES A DAY  . LORazepam (ATIVAN) 1 MG tablet Take 1 tablet (1 mg total) by mouth at bedtime. insomnia  . meclizine (ANTIVERT) 25 MG tablet Take 1 tablet (25 mg total) by mouth daily before supper. Take extra 67m in am prn nausea  . metFORMIN (GLUCOPHAGE) 500 MG tablet Take 1 tablet (500 mg total) by mouth 2 (two) times daily with a meal.  . metFORMIN (GLUCOPHAGE-XR) 500 MG 24 hr tablet Take 2 tablets (1,000 mg total) by mouth at bedtime.  . metoprolol tartrate (LOPRESSOR) 25 MG tablet TAKE 1 TABLET TWICE A DAY  . Multiple Vitamin (MULTIVITAMIN) capsule Take 1 capsule by mouth daily.    . nitroGLYCERIN (NITROSTAT) 0.4 MG SL tablet DISSOLVE 1 TABLET UNDER THE TONGUE AS NEEDED  . potassium chloride (K-DUR,KLOR-CON) 10 MEQ tablet Take 2 tablets (20 mEq total) by mouth daily.  . prednisoLONE sodium phosphate (INFLAMASE FORTE) 1 % ophthalmic solution Place 1 drop into both eyes daily.   . RABEprazole (ACIPHEX) 20 MG tablet TAKE 1 TABLET DAILY  . SPIRIVA HANDIHALER 18 MCG inhalation capsule PLACE 1 CAPSULE INTO INHALER AND INHALE THE CONTENTS OF 1 CAPSULE DAILY  . torsemide (DEMADEX) 20 MG tablet Take 2 tablets (40 mg total) by mouth as directed. Extra 211mMWF if needed  . traMADol (ULTRAM) 50 MG tablet Take 1 tablet (50 mg total) by mouth 2 (two) times daily. At 4pm and 10pm  . traZODone (DESYREL) 50 MG tablet TAKE ONE-HALF (1/2) TO 1 TABLET AT BEDTIME AS NEEDED FOR SLEEP  . Vitamin D, Ergocalciferol, (DRISDOL) 50000 units CAPS  capsule TAKE 1 CAPSULE ONCE EVERY WEEK   No facility-administered encounter medications on file as of 09/11/2015.      Allergies:  Actos [pioglitazone hydrochloride]; Pioglitazone; Atenolol; Codeine; Eszopiclone; Eszopiclone; Exenatide; Morphine; Sulfa antibiotics; Sulfacetamide sodium; Vicodin [hydrocodone-acetaminophen]; Exenatide; and Hydrocodone-acetaminophen  Review of Systems: Gen:  Denies  fever, sweats. HEENT: Denies blurred vision. Cvc:  No dizziness, chest pain or heaviness Resp:   Denies cough or sputum porduction. Gi: Denies swallowing difficulty, stomach pain. constipation, bowel incontinence Gu:  Denies bladder incontinence, burning urine Ext:   No Joint pain, stiffness. Skin: No skin rash, easy bruising. Endoc:  No polyuria, polydipsia. Psych: No depression, insomnia. Other:  All other systems were reviewed and found to be negative other than what is mentioned in the HPI.   Physical Examination:   VS: BP 132/62 (BP Location: Left Arm, Cuff Size: Normal)   Pulse 80  Ht 5' 4.5" (1.638 m)   Wt 138 lb (62.6 kg)   SpO2 97%   BMI 23.32 kg/m   General Appearance: No distress  Neuro:without focal findings,  speech normal,  HEENT: PERRLA, EOM intact. Pulmonary: normal breath sounds, bibasilar crackles.  CardiovascularNormal S1,S2.  No m/r/g.   Abdomen: Benign, Soft, non-tender. Renal:  No costovertebral tenderness  GU:  Not performed at this time. Endoc: No evident thyromegaly, no signs of acromegaly. Skin:   warm, no rash. Extremities: normal, no cyanosis, clubbing.   LABORATORY PANEL:   CBC No results for input(s): WBC, HGB, HCT, PLT in the last 168 hours. ------------------------------------------------------------------------------------------------------------------  Chemistries  No results for input(s): NA, K, CL, CO2, GLUCOSE, BUN, CREATININE, CALCIUM, MG, AST, ALT, ALKPHOS, BILITOT in the last 168 hours.  Invalid input(s):  GFRCGP ------------------------------------------------------------------------------------------------------------------  Cardiac Enzymes No results for input(s): TROPONINI in the last 168 hours. ------------------------------------------------------------  RADIOLOGY:   No results found for this or any previous visit. Results for orders placed during the hospital encounter of 09/05/14  DG Chest 2 View   Narrative CLINICAL DATA:  Increase shortness of breath for 3-6 months was congestive heart failure, nonsmoker, history of coronary artery disease  EXAM: CHEST  2 VIEW  COMPARISON:  03/13/2014  FINDINGS: Status post prior CABG. Heart size and vascular pattern normal. Stable aortic calcification. Mild central bronchitic change and interstitial prominence appears stable. Lower thoracic spine mild compression deformity stable from 05/15/2013. Possible 1.5 cm pulmonary nodule in the retrocardiac area.  IMPRESSION: Stable mild diffuse bronchitic and interstitial change when compared to prior studies.  Possible pulmonary nodule for which CT thorax is recommended.  These results will be called to the ordering clinician or representative by the Radiologist Assistant, and communication documented in the PACS or zVision Dashboard.   Electronically Signed   By: Skipper Cliche M.D.   On: 09/05/2014 11:32    ------------------------------------------------------------------------------------------------------------------  Thank  you for allowing [facility] to assist in the care of your patient. Our recommendations are noted above.  Please contact us if we can be of further service.   Marda Stalker, MD.  [facility]  Patricia Pesa, M.D.  Vilinda Boehringer, M.D.  Merton Border, M.D

## 2015-09-11 ENCOUNTER — Ambulatory Visit (INDEPENDENT_AMBULATORY_CARE_PROVIDER_SITE_OTHER): Payer: Medicare Other | Admitting: Internal Medicine

## 2015-09-11 ENCOUNTER — Encounter: Payer: Self-pay | Admitting: Internal Medicine

## 2015-09-11 VITALS — BP 132/62 | HR 80 | Ht 64.5 in | Wt 138.0 lb

## 2015-09-11 DIAGNOSIS — I25810 Atherosclerosis of coronary artery bypass graft(s) without angina pectoris: Secondary | ICD-10-CM | POA: Diagnosis not present

## 2015-09-11 DIAGNOSIS — J9611 Chronic respiratory failure with hypoxia: Secondary | ICD-10-CM | POA: Diagnosis not present

## 2015-09-11 NOTE — Patient Instructions (Signed)
--  Start nebulized albuterol tid, before meals, this may help her be able to walk to the dining [place] with less dyspnea.

## 2015-09-18 ENCOUNTER — Encounter: Payer: Self-pay | Admitting: Internal Medicine

## 2015-09-19 ENCOUNTER — Other Ambulatory Visit: Payer: Self-pay | Admitting: *Deleted

## 2015-09-19 MED ORDER — VITAMIN D (ERGOCALCIFEROL) 1.25 MG (50000 UNIT) PO CAPS: ORAL_CAPSULE | ORAL | 1 refills | Status: DC

## 2015-09-23 ENCOUNTER — Encounter: Payer: Self-pay | Admitting: Internal Medicine

## 2015-09-25 ENCOUNTER — Encounter: Payer: Self-pay | Admitting: Internal Medicine

## 2015-10-01 ENCOUNTER — Other Ambulatory Visit: Payer: Self-pay | Admitting: Family Medicine

## 2015-10-08 ENCOUNTER — Ambulatory Visit (INDEPENDENT_AMBULATORY_CARE_PROVIDER_SITE_OTHER): Payer: Medicare Other | Admitting: Family Medicine

## 2015-10-08 ENCOUNTER — Encounter: Payer: Self-pay | Admitting: Family Medicine

## 2015-10-08 VITALS — BP 126/76 | HR 76 | Temp 98.0°F | Ht 65.0 in | Wt 138.8 lb

## 2015-10-08 DIAGNOSIS — I25119 Atherosclerotic heart disease of native coronary artery with unspecified angina pectoris: Secondary | ICD-10-CM

## 2015-10-08 DIAGNOSIS — E559 Vitamin D deficiency, unspecified: Secondary | ICD-10-CM

## 2015-10-08 DIAGNOSIS — I10 Essential (primary) hypertension: Secondary | ICD-10-CM

## 2015-10-08 DIAGNOSIS — G8929 Other chronic pain: Secondary | ICD-10-CM

## 2015-10-08 DIAGNOSIS — Z23 Encounter for immunization: Secondary | ICD-10-CM

## 2015-10-08 DIAGNOSIS — N183 Chronic kidney disease, stage 3 unspecified: Secondary | ICD-10-CM

## 2015-10-08 DIAGNOSIS — J9611 Chronic respiratory failure with hypoxia: Secondary | ICD-10-CM

## 2015-10-08 DIAGNOSIS — Z0000 Encounter for general adult medical examination without abnormal findings: Secondary | ICD-10-CM | POA: Diagnosis not present

## 2015-10-08 DIAGNOSIS — E1121 Type 2 diabetes mellitus with diabetic nephropathy: Secondary | ICD-10-CM

## 2015-10-08 DIAGNOSIS — E785 Hyperlipidemia, unspecified: Secondary | ICD-10-CM | POA: Diagnosis not present

## 2015-10-08 DIAGNOSIS — I25810 Atherosclerosis of coronary artery bypass graft(s) without angina pectoris: Secondary | ICD-10-CM

## 2015-10-08 DIAGNOSIS — Z66 Do not resuscitate: Secondary | ICD-10-CM

## 2015-10-08 DIAGNOSIS — F331 Major depressive disorder, recurrent, moderate: Secondary | ICD-10-CM

## 2015-10-08 DIAGNOSIS — Z7189 Other specified counseling: Secondary | ICD-10-CM

## 2015-10-08 LAB — BASIC METABOLIC PANEL
BUN: 12 mg/dL (ref 6–23)
CO2: 38 mEq/L — ABNORMAL HIGH (ref 19–32)
CREATININE: 0.7 mg/dL (ref 0.40–1.20)
Calcium: 9.6 mg/dL (ref 8.4–10.5)
Chloride: 96 mEq/L (ref 96–112)
GFR: 84.45 mL/min (ref >60.00)
Glucose, Bld: 128 mg/dL — ABNORMAL HIGH (ref 70–99)
Potassium: 3.8 mEq/L (ref 3.5–5.1)
Sodium: 140 mEq/L (ref 135–145)

## 2015-10-08 LAB — LIPID PANEL
Cholesterol: 184 mg/dL (ref 0–200)
HDL: 49.2 mg/dL (ref >39.00)
LDL Cholesterol: 95 mg/dL (ref 0–99)
NonHDL: 134.74
TRIGLYCERIDES: 199 mg/dL — AB (ref 0.0–149.0)
Total CHOL/HDL Ratio: 4
VLDL: 39.8 mg/dL (ref 0.0–40.0)

## 2015-10-08 LAB — VITAMIN D 25 HYDROXY (VIT D DEFICIENCY, FRACTURES): VITD: 71.47 ng/mL (ref 30.00–100.00)

## 2015-10-08 LAB — CBC WITH DIFFERENTIAL/PLATELET
BASOS PCT: 0.4 % (ref 0.0–3.0)
Basophils Absolute: 0 10*3/uL (ref 0.0–0.1)
Eosinophils Absolute: 0.4 10*3/uL (ref 0.0–0.7)
Eosinophils Relative: 4.3 % (ref 0.0–5.0)
HEMATOCRIT: 37.1 % (ref 36.0–46.0)
Hemoglobin: 11.9 g/dL — ABNORMAL LOW (ref 12.0–15.0)
LYMPHS PCT: 30.4 % (ref 12.0–46.0)
Lymphs Abs: 2.9 10*3/uL (ref 0.7–4.0)
MCHC: 31.9 g/dL (ref 30.0–36.0)
MCV: 83.2 fl (ref 78.0–100.0)
MONOS PCT: 7.9 % (ref 3.0–12.0)
Monocytes Absolute: 0.8 10*3/uL (ref 0.1–1.0)
NEUTROS ABS: 5.5 10*3/uL (ref 1.4–7.7)
Neutrophils Relative %: 57 % (ref 43.0–77.0)
PLATELETS: 220 10*3/uL (ref 150.0–400.0)
RBC: 4.46 Mil/uL (ref 3.87–5.11)
RDW: 15.7 % — AB (ref 11.5–15.5)
WBC: 9.6 10*3/uL (ref 4.0–10.5)

## 2015-10-08 LAB — HEMOGLOBIN A1C: Hgb A1c MFr Bld: 7.4 % — ABNORMAL HIGH (ref 4.6–6.5)

## 2015-10-08 NOTE — Progress Notes (Signed)
Pre visit review using our clinic review tool, if applicable. No additional management support is needed unless otherwise documented below in the visit note. 

## 2015-10-08 NOTE — Assessment & Plan Note (Signed)
Chronic, stable. Continue current regimen. 

## 2015-10-08 NOTE — Assessment & Plan Note (Signed)
Check labs today. Continue metformin and lantus.

## 2015-10-08 NOTE — Assessment & Plan Note (Addendum)
Reviewed

## 2015-10-08 NOTE — Patient Instructions (Addendum)
Labs today. Flu shot today.  Change tramadol to 3pm instead of 4pm. Continue 10pm tramadol dose. Trial off flonase for now. Return in 4 months for follow up visit.

## 2015-10-08 NOTE — Assessment & Plan Note (Signed)
Advanced directives - has at home. Daughter [name] knows pt wouldn't want extraordinary measures. Thinks son would be HCPOA - completed this 02/2014. DNR.

## 2015-10-08 NOTE — Assessment & Plan Note (Signed)

## 2015-10-08 NOTE — Assessment & Plan Note (Signed)
Discussed tramadol dosing - change 4pm dose to 3pm. If persistent mood trouble, consider 1/2 ativan during day PRN.

## 2015-10-08 NOTE — Assessment & Plan Note (Addendum)
Ongoing, due to limitations from chronic illnesses. Continue celexa, trazodone, ativan. Discussed possible increased ativan dosing to BID.

## 2015-10-08 NOTE — Assessment & Plan Note (Signed)
Ongoing angina treated with SL nitro. Has declined further eval.

## 2015-10-08 NOTE — Progress Notes (Signed)
BP 126/76   Pulse 76   Temp 98 F (36.7 C) (Oral)   Ht 5' 5"  (1.651 m)   Wt 138 lb 12.8 oz (63 kg)   SpO2 93%   BMI 23.10 kg/m    CC: medicare wellness visit Subjective:    Patient ID: Christine Blanchard, female    DOB: [birth year], 82 y.o.   MRN: [id number]  HPI: Christine Blanchard is a 82 y.o. female presenting on 10/08/2015 for Annual Exam (medicare wellness)   Christine Blanchard is a pleasant 82 yo with h/o HTN, DM, h/o CVA after cath, severe CAD s/p multiple interventions now planned medical management, diastolic CHF and pulmonary fibrosis with chronic hypoxemic respiratory failure with both obstructive and restrictive components and respiratory muscle weakness who is oxygen dependent. She presents today for medicare wellness visit  Recently saw Dr Rockey Situ and Dr Ashby Dawes. Continued medical management of severe CAD. Nebulized albuterol started TID prior to walk to [place] in hopes of improving exertional dyspnea. This worsened her tremors so she stopped this.   Ongoing intermittent L shoulder pain that improves with hot shower. Known shoulder spur. Ongoing stable angina and dyspnea.   Vision problems - known macular degeneration, has had corneal transplants. Followed by [facility]. Overall stable Hearing - failed hearing screen on R>L.declines further evaluation. Does not want hearing aides  High fall risk - walks slowly and uses walker. Has had hip fracture in the past. Denies depression/anhedonia. Difficulty coping with medical problems. Stable on celexa - doesn't feel would be able to decrease celexa.   Preventative: No colonsocopy, not recommended (h/o several diverticulitis). Mother did die fom colon cancer. No current problems with diverticulitis.  Pap - last had 3 years ago, Dr. Quincy Simmonds (OBGYN). Always monogamous relationship until husband died. Never had abnormal. Aged out. Mammo - 2011 normal per patient. No family or personal history of breast cancer. Has had breast biopsies in past  but all benign. Doesn't want to continue screening.  Flu shot today Pneumovax 2009, prevnar 2015 Td 2014 Shingles - 2011 per patient Thinks had bone density scan done around 2005 and told normal, declines repeat. Advanced directives - has at home. Daughter [name] knows pt wouldn't want extraordinary measures. Thinks son would be HCPOA - completed this 02/2014. DNR.   Lives alone at [place] @ [place]. No pets Has lifeline. Daughter nearby [facility]) is CHF RN at [employer] nearby [place])  Relevant past medical, surgical, family and social history reviewed and updated as indicated. Interim medical history since our last visit reviewed. Allergies and medications reviewed and updated. Current Outpatient Prescriptions on File Prior to Visit  Medication Sig  . acetaminophen (TYLENOL) 500 MG tablet Take 500 mg by mouth 3 (three) times daily.  . ASPIRIN LOW DOSE 81 MG chewable tablet TAKE 1 TABLET BY MOUTH ONCE DAILY  . atorvastatin (LIPITOR) 20 MG tablet TAKE 1 TABLET NIGHTLY  . BD AUTOSHIELD DUO 30G X 5 MM MISC Use as instructed with insulin Dx: E11.21  . cetirizine (ZYRTEC ALLERGY) 10 MG tablet Take 1 tablet (10 mg total) by mouth daily.  . citalopram (CELEXA) 10 MG tablet TAKE 1 TABLET DAILY  . clopidogrel (PLAVIX) 75 MG tablet TAKE 1 TABLET DAILY  . diphenhydrAMINE (BENADRYL) 25 mg capsule Take 1 capsule (25 mg total) by mouth 3 (three) times daily as needed for itching.  . docusate sodium (COLACE) 100 MG capsule Take 1 capsule (100 mg total) by mouth daily.  . fluticasone (FLONASE)  50 MCG/ACT nasal spray Place 2 sprays into both nostrils daily.  Marland Kitchen glucose blood (FREESTYLE LITE) test strip Use to check sugar three times daily Dx: 250.40  . glucose monitoring kit (FREESTYLE) monitoring kit 1 each by Does not apply route as needed for other.  . Insulin Glargine (LANTUS SOLOSTAR) 100 UNIT/ML Solostar Pen Inject 10 Units into the skin daily with breakfast.  . isosorbide  mononitrate (IMDUR) 30 MG 24 hr tablet Take 1 tablet (30 mg total) by mouth at bedtime.  . Lancets (FREESTYLE) lancets USE TO CHECK SUGAR THREE TIMES A DAY  . LORazepam (ATIVAN) 1 MG tablet Take 1 tablet (1 mg total) by mouth at bedtime. insomnia  . meclizine (ANTIVERT) 25 MG tablet Take 1 tablet (25 mg total) by mouth daily before supper. Take extra 65m in am prn nausea  . metFORMIN (GLUCOPHAGE-XR) 500 MG 24 hr tablet Take 2 tablets (1,000 mg total) by mouth at bedtime.  . metoprolol tartrate (LOPRESSOR) 25 MG tablet TAKE 1 TABLET TWICE A DAY  . Multiple Vitamin (MULTIVITAMIN) capsule Take 1 capsule by mouth daily.    . nitroGLYCERIN (NITROSTAT) 0.4 MG SL tablet DISSOLVE 1 TABLET UNDER THE TONGUE AS NEEDED  . potassium chloride (K-DUR,KLOR-CON) 10 MEQ tablet Take 2 tablets (20 mEq total) by mouth daily.  . prednisoLONE sodium phosphate (INFLAMASE FORTE) 1 % ophthalmic solution Place 1 drop into both eyes daily.   . RABEprazole (ACIPHEX) 20 MG tablet TAKE 1 TABLET DAILY  . SPIRIVA HANDIHALER 18 MCG inhalation capsule PLACE 1 CAPSULE INTO INHALER AND INHALE THE CONTENTS OF 1 CAPSULE DAILY  . torsemide (DEMADEX) 20 MG tablet Take 2 tablets (40 mg total) by mouth as directed. Extra 269mMWF if needed  . traMADol (ULTRAM) 50 MG tablet Take 1 tablet (50 mg total) by mouth 2 (two) times daily. At 4pm and 10pm  . traZODone (DESYREL) 50 MG tablet TAKE ONE-HALF (1/2) TO 1 TABLET AT BEDTIME AS NEEDED FOR SLEEP  . Vitamin D, Ergocalciferol, (DRISDOL) 50000 units CAPS capsule TAKE 1 CAPSULE ONCE EVERY WEEK   No current facility-administered medications on file prior to visit.     Review of Systems Per HPI unless specifically indicated in ROS section     Objective:    BP 126/76   Pulse 76   Temp 98 F (36.7 C) (Oral)   Ht 5' 5"  (1.651 m)   Wt 138 lb 12.8 oz (63 kg)   SpO2 93%   BMI 23.10 kg/m   Wt Readings from Last 3 Encounters:  10/08/15 138 lb 12.8 oz (63 kg)  09/11/15 138 lb (62.6 kg)    06/24/15 136 lb (61.7 kg)    Physical Exam  Constitutional: She appears well-developed and well-nourished. No distress.  HENT:  Mouth/Throat: Oropharynx is clear and moist. No oropharyngeal exudate.  Eyes: Conjunctivae are normal. Pupils are equal, round, and reactive to light.  Neck: Normal range of motion. Neck supple. No thyromegaly present.  Cardiovascular: Normal rate, regular rhythm, normal heart sounds and intact distal pulses.   No murmur heard. Pulmonary/Chest: Effort normal and breath sounds normal. No respiratory distress. She has no wheezes. She has no rales.  Coarse crackles bilaterally  Abdominal: Soft. Bowel sounds are normal. She exhibits no distension and no mass. There is tenderness (mild). There is no rebound and no guarding.  Musculoskeletal: She exhibits no edema.  Lymphadenopathy:    She has no cervical adenopathy.  Skin: Skin is warm and dry. No rash noted.  Psychiatric:  She has a normal mood and affect.  Nursing note and vitals reviewed.  Results for orders placed or performed in visit on 10/08/15  Lipid panel  Result Value Ref Range   Cholesterol 184 0 - 200 mg/dL   Triglycerides 199.0 (H) 0.0 - 149.0 mg/dL   HDL 49.20 >39.00 mg/dL   VLDL 39.8 0.0 - 40.0 mg/dL   LDL Cholesterol 95 0 - 99 mg/dL   Total CHOL/HDL Ratio 4    NonHDL 134.74   Hemoglobin A1c  Result Value Ref Range   Hgb A1c MFr Bld 7.4 (H) 4.6 - 6.5 %  CBC with Differential/Platelet  Result Value Ref Range   WBC 9.6 4.0 - 10.5 K/uL   RBC 4.46 3.87 - 5.11 Mil/uL   Hemoglobin 11.9 (L) 12.0 - 15.0 g/dL   HCT 37.1 36.0 - 46.0 %   MCV 83.2 78.0 - 100.0 fl   MCHC 31.9 30.0 - 36.0 g/dL   RDW 15.7 (H) 11.5 - 15.5 %   Platelets 220.0 150.0 - 400.0 K/uL   Neutrophils Relative % 57.0 43.0 - 77.0 %   Lymphocytes Relative 30.4 12.0 - 46.0 %   Monocytes Relative 7.9 3.0 - 12.0 %   Eosinophils Relative 4.3 0.0 - 5.0 %   Basophils Relative 0.4 0.0 - 3.0 %   Neutro Abs 5.5 1.4 - 7.7 K/uL   Lymphs  Abs 2.9 0.7 - 4.0 K/uL   Monocytes Absolute 0.8 0.1 - 1.0 K/uL   Eosinophils Absolute 0.4 0.0 - 0.7 K/uL   Basophils Absolute 0.0 0.0 - 0.1 K/uL  Basic metabolic panel  Result Value Ref Range   Sodium 140 135 - 145 mEq/L   Potassium 3.8 3.5 - 5.1 mEq/L   Chloride 96 96 - 112 mEq/L   CO2 38 (H) 19 - 32 mEq/L   Glucose, Bld 128 (H) 70 - 99 mg/dL   BUN 12 6 - 23 mg/dL   Creatinine, Ser 0.70 0.40 - 1.20 mg/dL   Calcium 9.6 8.4 - 10.5 mg/dL   GFR 84.45 >60.00 mL/min  VITAMIN D 25 Hydroxy (Vit-D Deficiency, Fractures)  Result Value Ref Range   VITD 71.47 30.00 - 100.00 ng/mL      Assessment & Plan:   Problem List Items Addressed This Visit    Advanced care planning/counseling discussion    Advanced directives - has at home. Daughter [name] knows pt wouldn't want extraordinary measures. Thinks son would be HCPOA - completed this 02/2014. DNR.       CAD (coronary artery disease) of artery bypass graft   Chronic hypoxemic respiratory failure (HCC)   Chronic pain    Discussed tramadol dosing - change 4pm dose to 3pm. If persistent mood trouble, consider 1/2 ativan during day PRN.       CKD (chronic kidney disease) stage 3, GFR 30-59 ml/min   Relevant Orders   CBC with Differential/Platelet (Completed)   Coronary artery disease    Ongoing angina treated with SL nitro. Has declined further eval.       DNR (do not resuscitate)    Reviewed.       Essential hypertension    Chronic, stable. Continue current regimen.       Relevant Orders   Basic metabolic panel (Completed)   Hyperlipidemia   Relevant Orders   Lipid panel (Completed)   MDD (major depressive disorder), recurrent episode, moderate (HCC)    Ongoing, due to limitations from chronic illnesses. Continue celexa, trazodone, ativan. Discussed possible increased ativan dosing  to BID.       Medicare annual wellness visit, subsequent - Primary    I have personally reviewed the Medicare Annual Wellness questionnaire and  have noted 1. The patient's medical and social history 2. Their use of alcohol, tobacco or illicit drugs 3. Their current medications and supplements 4. The patient's functional ability including ADL's, fall risks, home safety risks and hearing or visual impairment. Cognitive function has been assessed and addressed as indicated.  5. Diet and physical activity 6. Evidence for depression or mood disorders The patients weight, height, BMI have been recorded in the chart. I have made referrals, counseling and provided education to the patient based on review of the above and I have provided the pt with a written personalized care plan for preventive services. Provider list updated.. See scanned questionairre as needed for further documentation. Reviewed preventative protocols and updated unless pt declined.       Vitamin D deficiency   Relevant Orders   VITAMIN D 25 Hydroxy (Vit-D Deficiency, Fractures) (Completed)   Well controlled type 2 diabetes mellitus with nephropathy ([place])    Check labs today. Continue metformin and lantus.       Relevant Orders   Hemoglobin A1c (Completed)    Other Visit Diagnoses    Flu vaccine need       Relevant Orders   Flu Vaccine QUAD 36+ mos IM (Fluarix & Fluzone Quad PF (Completed)       Follow up plan: Return in about 4 months (around 02/07/2016) for follow up visit.  Ria Bush, MD

## 2015-10-14 ENCOUNTER — Encounter: Payer: Self-pay | Admitting: Internal Medicine

## 2015-10-15 ENCOUNTER — Telehealth: Payer: Self-pay | Admitting: *Deleted

## 2015-10-15 MED ORDER — AMBULATORY NON FORMULARY MEDICATION: 0 refills | Status: DC

## 2015-10-15 NOTE — Telephone Encounter (Signed)
Pt needs something sent to [facility] so they will d/c her breathing treatments since they make her have tremors and she is no longer doing them per the email with the office staff and DR. Will fax to [facility].

## 2015-11-01 ENCOUNTER — Other Ambulatory Visit: Payer: Self-pay | Admitting: Internal Medicine

## 2015-11-06 DIAGNOSIS — C44622 Squamous cell carcinoma of skin of right upper limb, including shoulder: Secondary | ICD-10-CM | POA: Diagnosis not present

## 2015-11-06 DIAGNOSIS — D485 Neoplasm of uncertain behavior of skin: Secondary | ICD-10-CM | POA: Diagnosis not present

## 2015-11-06 DIAGNOSIS — L820 Inflamed seborrheic keratosis: Secondary | ICD-10-CM | POA: Diagnosis not present

## 2015-11-06 DIAGNOSIS — L570 Actinic keratosis: Secondary | ICD-10-CM | POA: Diagnosis not present

## 2015-11-06 DIAGNOSIS — L821 Other seborrheic keratosis: Secondary | ICD-10-CM | POA: Diagnosis not present

## 2015-11-10 ENCOUNTER — Other Ambulatory Visit: Payer: Self-pay | Admitting: Family Medicine

## 2015-11-16 IMAGING — CR DG CHEST 1V
1 series · 1 of 1 positions shown · non-contrast
Comparison: 03/14/2013

CLINICAL DATA: Right hip pain and short of breath. Diabetic. Chest
heart failure.

EXAM:
CHEST  1 VIEW

[t chest supine]
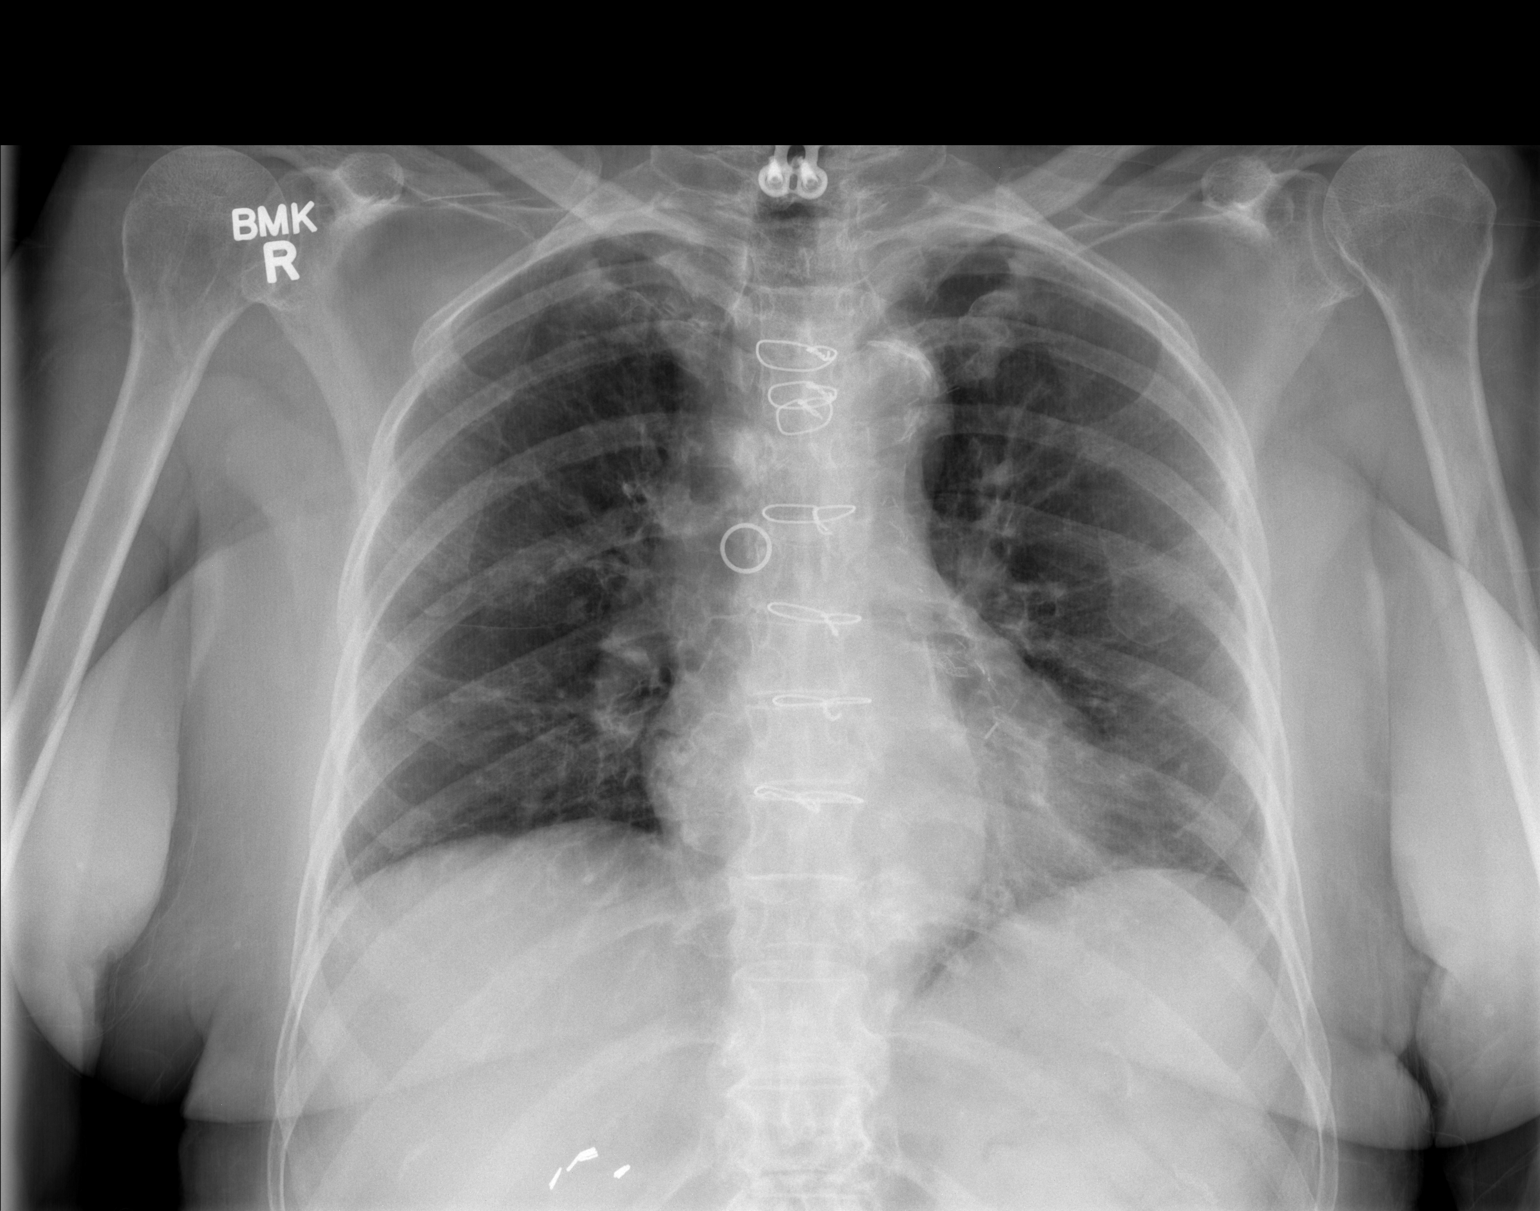

[1 of 1 positions shown; findings below may reference images not displayed]

FINDINGS: Sternotomy wires overlie normal cardiac silhouette. Increased
reticular nodular pattern within the left and right lung. No overt
pulmonary edema. No pleural fluid. No pneumothorax. Anterior
cervical fusion noted.
IMPRESSION: Bilateral reticular nodular pattern of the lungs favors infectious
or inflammatory process. Recommend CT thorax for further evaluation.

## 2015-11-16 IMAGING — CR DG KNEE 1-2V*R*
1 series · 2 of 2 positions shown · non-contrast
Comparison: None.

CLINICAL DATA: Fall at [DATE] p.m. with right hip shortening and
right knee pain.

EXAM:
RIGHT KNEE - 1-2 VIEW

[Series 5: x knee lat right · 0.14mm/px · 2 of 2 slices shown]
[im 1/2]
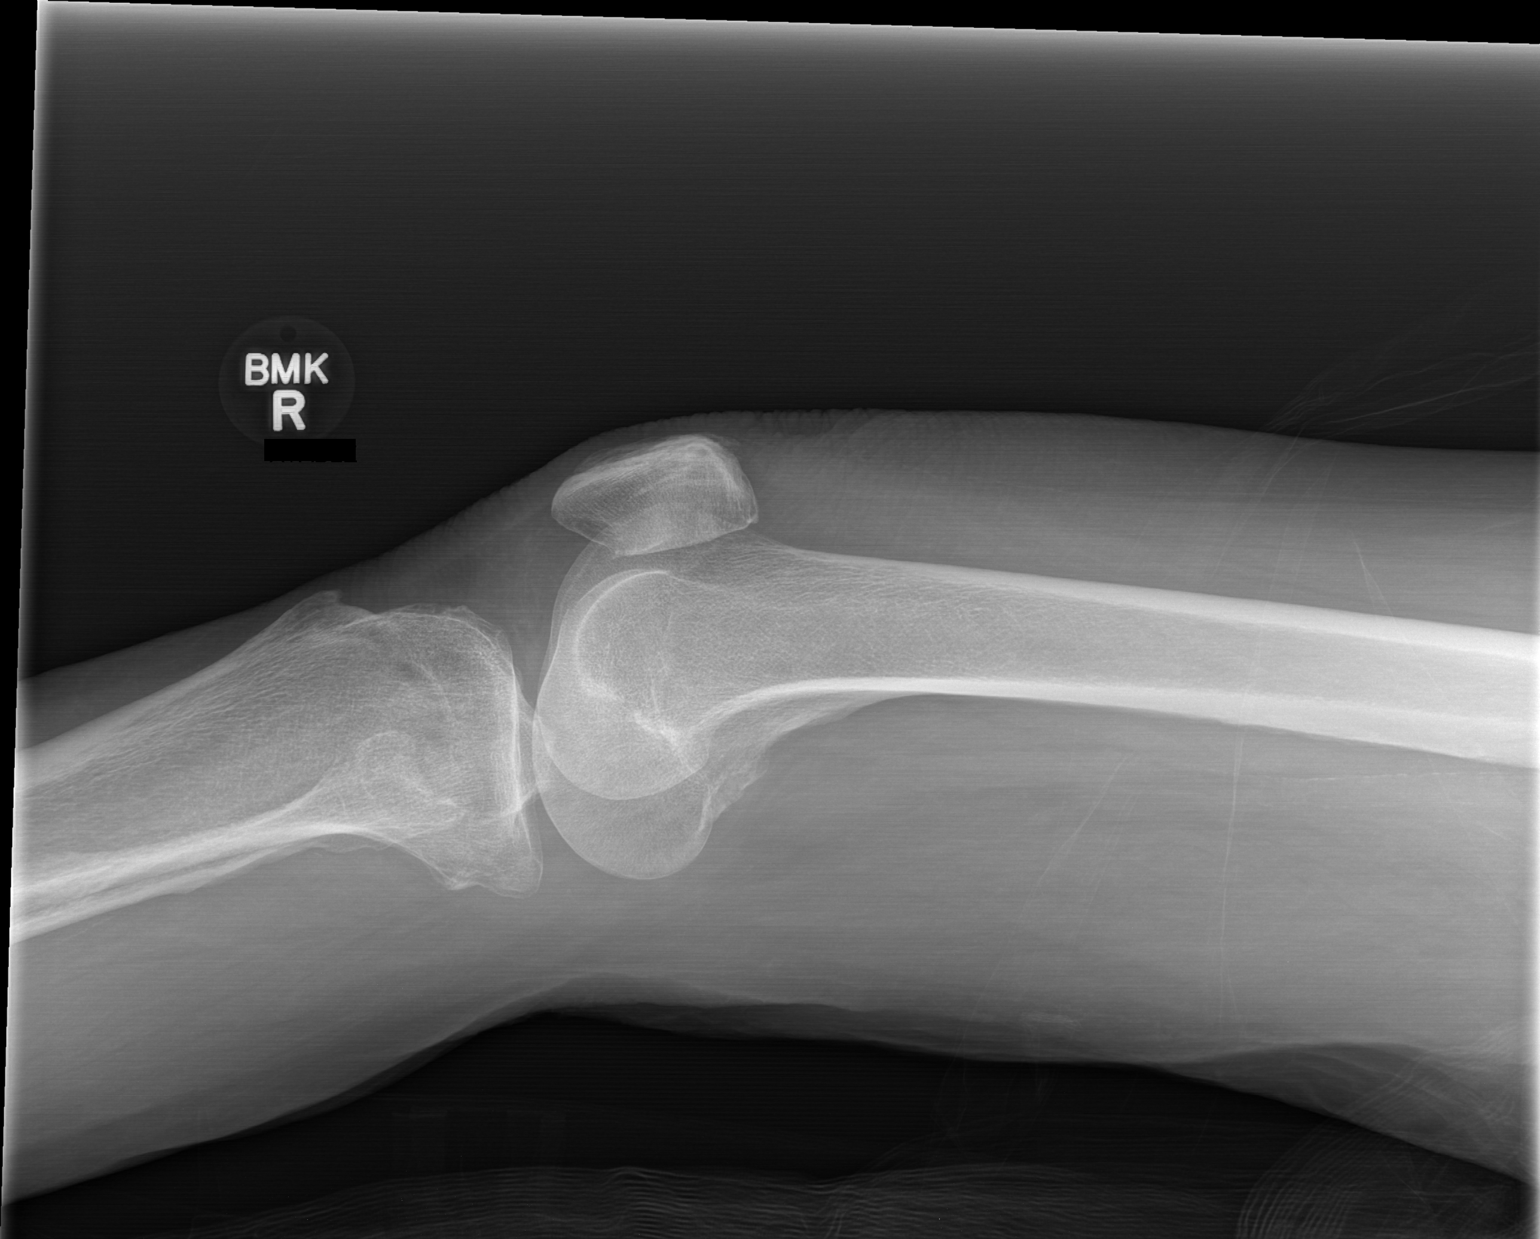
[im 2/2]
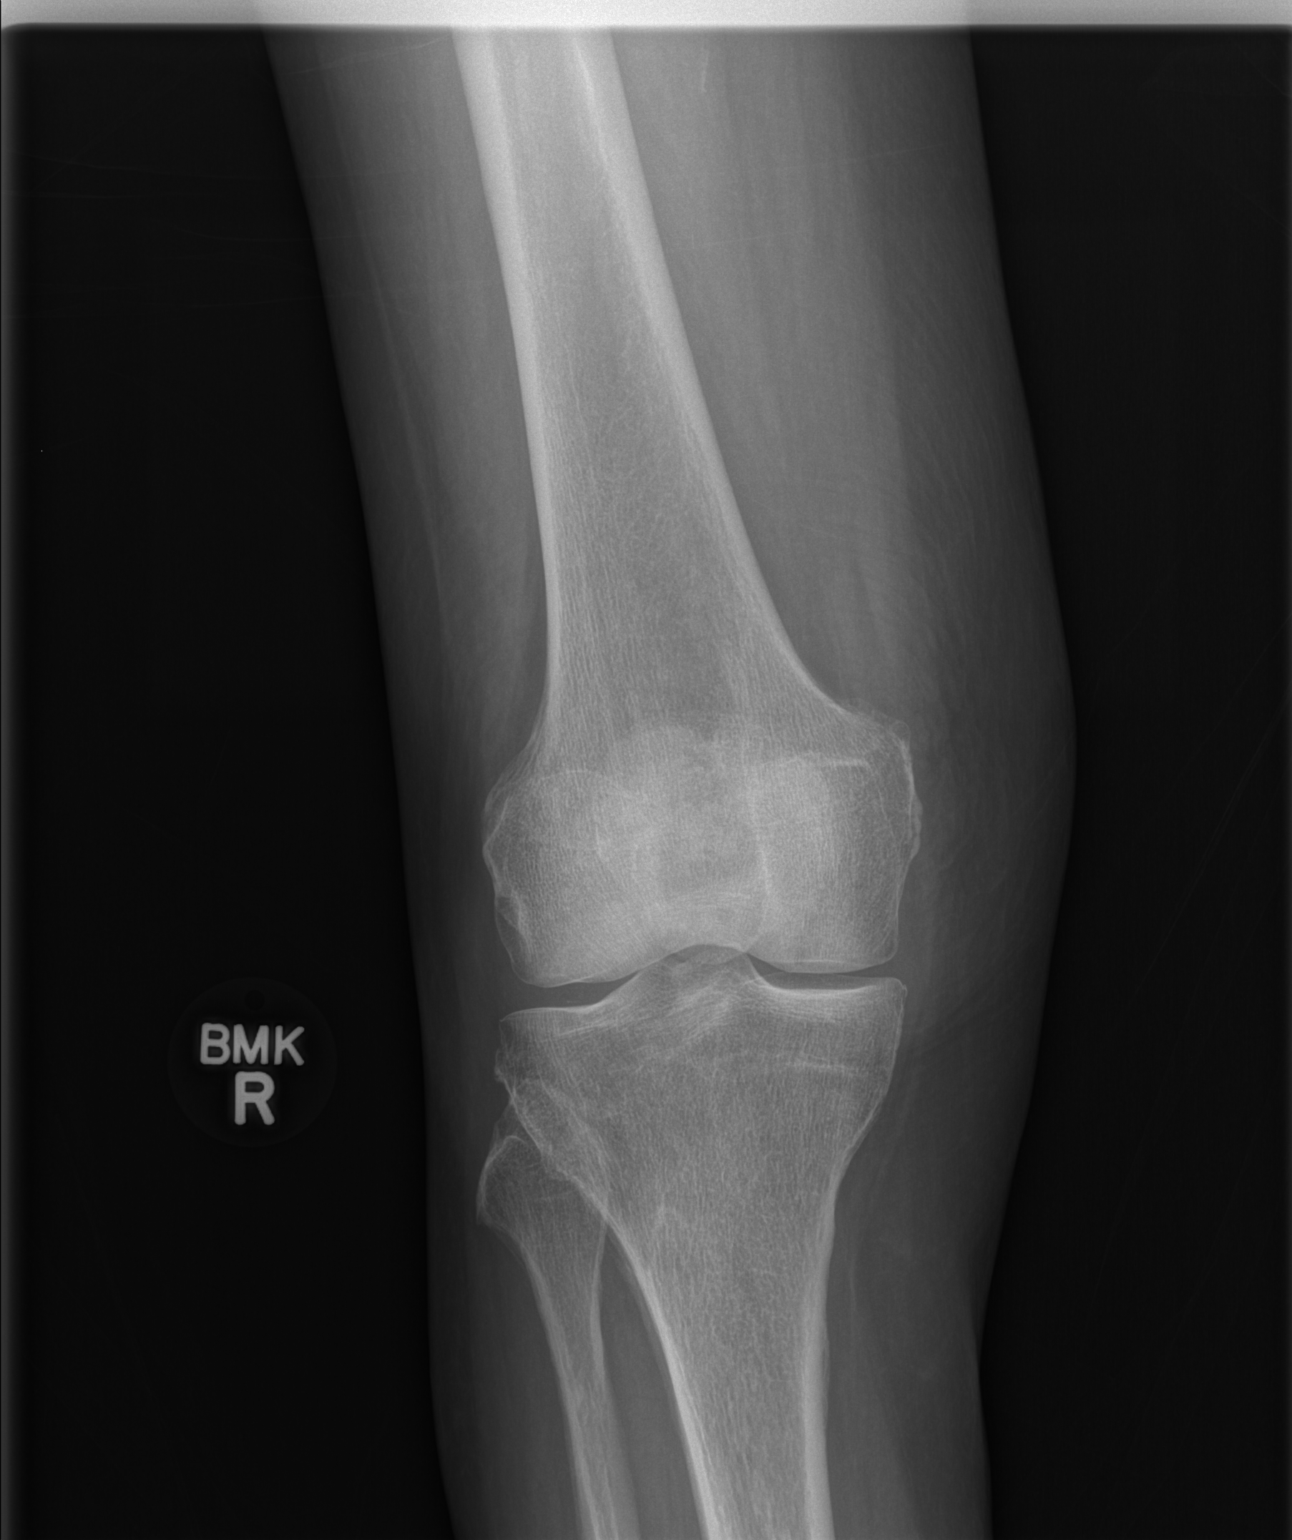

[2 of 2 positions shown; findings below may reference images not displayed]

FINDINGS: Deformity of the proximal tibial demonstrated on lateral view. There
is no overlying soft tissue swelling and no effusion is present. No
focal cortical irregularities. This likely represents old deformity.
No definite evidence of any acute fracture or dislocation of the
right knee. Vascular calcifications in the soft tissues.
IMPRESSION: Old appearing deformity of the proximal right tibia. No acute bony
abnormalities.

## 2015-11-18 ENCOUNTER — Other Ambulatory Visit: Payer: Self-pay | Admitting: *Deleted

## 2015-11-18 ENCOUNTER — Encounter: Payer: Self-pay | Admitting: Family Medicine

## 2015-11-18 MED ORDER — TRAMADOL HCL 50 MG PO TABS: 50.0000 mg | ORAL_TABLET | Freq: Two times a day (BID) | ORAL | 0 refills | Status: DC

## 2015-11-18 MED ORDER — TRAMADOL HCL 50 MG PO TABS: 50.0000 mg | ORAL_TABLET | Freq: Two times a day (BID) | ORAL | 1 refills | Status: DC

## 2015-11-18 NOTE — Telephone Encounter (Signed)
Christine Blanchard at [facility] called and said patient is very upset that her prescriptions are being sent to [facility] instead of [facility].  Patient has 1 pill left and they let patient know she would have to get this prescription from [facility].  [name] is asking for 30 pills to be sent to [facility] and the rest to [facility]. She'd like to have it called in today.  [name] can be reached at [phone].

## 2015-11-18 NOTE — Telephone Encounter (Signed)
Ok to refill? LAst filled 07/22/15 #[id number]

## 2015-11-18 NOTE — Telephone Encounter (Signed)
Pt left /vm requesting status of tramadol rx.Please advise.

## 2015-11-18 NOTE — Telephone Encounter (Signed)
Rx called in as directed. Pharmacist (Ron?) was very short and said that you shouldn't be sending in controlled meds to mail order for the ALF to have in bulk at their facility. I advised him that the patient requested that the med be sent to mail order, I was assuming due to cost. He said that this was still not appropriate procedure and not advisable. He said the patient ran out on Friday and they had to loan the facility #6 to get patient through the weekend and then didn't hear back from [place] yesterday when they sent the RF request. I was trying to explain that we have 24-72 hours to respond to requests, but he hung up.   Rx faxed as requested.

## 2015-11-18 NOTE — Telephone Encounter (Signed)
[facility]ed [facility]. plz phone in 30d to [facility].

## 2015-11-19 MED ORDER — TRAMADOL HCL 50 MG PO TABS: 50.0000 mg | ORAL_TABLET | Freq: Two times a day (BID) | ORAL | 3 refills | Status: DC

## 2015-11-19 NOTE — Telephone Encounter (Addendum)
If it's ALF policy not to send in controlled substances to mail order, please cancel Rx sent in.  We will send locally. plz notify pt that her ALF policy is not to send in to mail order according to ALF pharmacist. We can phone in tramadol #[phone] with RF #3.

## 2015-11-19 NOTE — Addendum Note (Signed)
Addended by: Ria Bush on: 11/19/2015 01:17 PM   Modules accepted: Orders

## 2015-11-21 NOTE — Telephone Encounter (Signed)
Not against policy, just not recommended.

## 2015-11-22 ENCOUNTER — Other Ambulatory Visit: Payer: Self-pay | Admitting: Family Medicine

## 2015-11-22 ENCOUNTER — Other Ambulatory Visit: Payer: Self-pay | Admitting: Cardiovascular Disease

## 2015-11-24 ENCOUNTER — Ambulatory Visit (INDEPENDENT_AMBULATORY_CARE_PROVIDER_SITE_OTHER): Payer: Medicare Other | Admitting: Cardiovascular Disease

## 2015-11-24 ENCOUNTER — Other Ambulatory Visit: Payer: Self-pay

## 2015-11-24 ENCOUNTER — Encounter: Payer: Self-pay | Admitting: Cardiovascular Disease

## 2015-11-24 VITALS — BP 120/80 | HR 71 | Ht 64.5 in | Wt 137.8 lb

## 2015-11-24 DIAGNOSIS — F329 Major depressive disorder, single episode, unspecified: Secondary | ICD-10-CM

## 2015-11-24 DIAGNOSIS — I25810 Atherosclerosis of coronary artery bypass graft(s) without angina pectoris: Secondary | ICD-10-CM

## 2015-11-24 DIAGNOSIS — E7800 Pure hypercholesterolemia, unspecified: Secondary | ICD-10-CM | POA: Diagnosis not present

## 2015-11-24 DIAGNOSIS — I5032 Chronic diastolic (congestive) heart failure: Secondary | ICD-10-CM | POA: Diagnosis not present

## 2015-11-24 DIAGNOSIS — F32A Depression, unspecified: Secondary | ICD-10-CM

## 2015-11-24 DIAGNOSIS — I10 Essential (primary) hypertension: Secondary | ICD-10-CM

## 2015-11-24 NOTE — Progress Notes (Signed)
Cardiology Office Note  Date:  11/24/2015   ID:  Christine Blanchard, DOB Feb 10, 1928, MRN [id number]  PCP:  Ria Bush, MD   Chief Complaint  Patient presents with  . other    6 month follow up. Meds reviewed by the pt. verbally. Pt. c/o chest pain.     HPI:  [name] is a very pleasant 82 -year-old woman with history of coronary artery disease, bypass surgery in 2004, hyperlipidemia, PCI 6 months after her bypass, repeat PCI one year later (She does report having a stroke after her cardiac catheterization), diabetes, hypertension, spinal stenosis,  who currently lives in a nursing home in [place], who has chronic unsteady gait , history of falls,  with chronic shortness of breath on oxygen who presents for routine followup of her shortness of breath, chronic diastolic CHF. Previous Pulmonary workup has revealed restrictive lung disease.  She currently lives at [facility]. She has chronic hypoxemic respiratory failure with both obstructive and restrictive components and respiratory muscle weakness who is oxygen dependent. She presents today for follow-up of her shortness of breath  On today's visit, she reports no new major medical issues She is depressed, wonders why she is still here Reports that the nursing facility keeps messing up her medications No activities, wishes sheet stayed at independent living  Stable shortness of breath, oxygen 2-3 L by nasal cannula Denies any significant chest pain concerning for angina  chronic anorexia, nausea Takes torsemide 40 mg dailyConsistent with her diuretic regiment. She has general malaise, fatigue.  Previous episodes ofChronic chest pain, takes nitroglycerin periodically on her own  previousechocardiogram showing normal ejection fraction, normal right heart pressures no significant valve disease  She sees pulmonary for her underlying pulmonary issues  EKG on today's visit shows normal sinus rhythm with rate 71 bpm, nonspecific  T-wave abnormality  Other past medical history  mechanical fall February 2016 with hip fracture. Initially sent to [facility], transferred to [facility] at the request of family where she underwent hip repair with several screws.  Transfer back to rehabilitation. She is not full weightbearing at this time. Partial weightbearing with a walker. Gait continues to be unsteady.  History of chronic nausea, on Zofran  Anorexia has been going on since November 2015 with significant weight loss since her prior clinic visit.  Previously was on stool softeners, does not taking this on a regular basis.  No falls or syncope.  No regular exercise program.  Denies any cough. On oxygen, level at 2 L even with ambulation.  Previous cardiac CTA in the past for atypical chest pain that showed: Patent SVG to D1 with poor distal runoff,  Occluded LIMA to LAD.  Distal LAD never visualized,  RCA and circumflex patient with multiple 50% or less calcfic Lesions,  Poor distal runoff from stented IM/LAD and small D1 supplied by SVG  Previous  Echocardiogram was essentially normal with normal ejection fraction greater than 76%, diastolic dysfunction, normal right ventricular systolic pressures  Stress test showed no ischemia with ejection fraction greater than 55%   12/2011 In the hospital, total cholesterol 146, LDL 58, HDL 65  PMH:   has a past medical history of Allergic rhinitis; Benign essential tremor; Breathing difficulty (2011); CAD (coronary artery disease); Chronic kidney disease; CVA (cerebral infarction); Diabetes mellitus (2003); Diastolic CHF ([place]) (03/2631); Dry senile macular degeneration; Fracture of femoral neck, right ([place]) (03/2014); Gait instability; GERD (gastroesophageal reflux disease) (2004); Hearing loss of both ears (08/2010); Herpes zoster ophthalmicus (2009); History of  chicken pox; History of diverticulitis of colon (1991, 1992, 1994); History of kidney stones (1952); Hyperlipidemia; Hypertension; Leg  weakness, bilateral (2014); Lumbar spinal stenosis; Squamous cell skin cancer, upper arm (03/2015); Syncope (2005); Urinary incontinence; Vitamin D deficiency; and Wedge compression fracture of T10 vertebra ([place]) (05/28/2013).  PSH:    Past Surgical History:  Procedure Laterality Date  . APPENDECTOMY  1948  . BREAST BIOPSY  2001   x2, both benign, last mammo 2011, last pap smear 2011  . CARDIAC CATHETERIZATION  2009   x5  . CARDIOVASCULAR STRESS TEST  2010   nuclear, normal  . CATARACT EXTRACTION     bilateral, corneal dystrophy  . CERVICAL FUSION  2002   anterior decompression and fusion C6/7  . CHOLECYSTECTOMY  1985  . CORNEAL TRANSPLANT     x3  . CORONARY ARTERY BYPASS GRAFT  2004  . DEXA  2005   normal  . HIP FRACTURE SURGERY Right 03/2014   closed reduction percutaneous pinning of R femoral neck fracture ([place], [facility])  . LUMBAR SPINE SURGERY  2010   laminotomy (L2/3, 3/4)  . PERCUTANEOUS CORONARY STENT INTERVENTION (PCI-S)  2005   stents x 2 (intermediate and LAD), 2nd complicated by stroke    Current Outpatient Prescriptions  Medication Sig Dispense Refill  . acetaminophen (TYLENOL) 500 MG tablet Take 500 mg by mouth 3 (three) times daily.    . AMBULATORY NON FORMULARY MEDICATION Medication Name: Please discontinue the nebulizer treatments. 1 each 0  . ASPIRIN LOW DOSE 81 MG chewable tablet TAKE 1 TABLET BY MOUTH ONCE DAILY 30 tablet 6  . atorvastatin (LIPITOR) 20 MG tablet TAKE 1 TABLET NIGHTLY 90 tablet 1  . BD AUTOSHIELD DUO 30G X 5 MM MISC Use as instructed with insulin Dx: E11.21 100 each 3  . cetirizine (ZYRTEC) 10 MG tablet TAKE 1 TABLET DAILY 90 tablet 1  . citalopram (CELEXA) 10 MG tablet TAKE 1 TABLET DAILY 90 tablet 1  . clopidogrel (PLAVIX) 75 MG tablet TAKE 1 TABLET DAILY 90 tablet 3  . diphenhydrAMINE (BENADRYL) 25 mg capsule Take 1 capsule (25 mg total) by mouth 3 (three) times daily as needed for itching. 90 capsule 0  . docusate sodium (COLACE) 100 MG  capsule Take 1 capsule (100 mg total) by mouth daily. 90 capsule 1  . glucose blood (FREESTYLE LITE) test strip Use to check sugar three times daily Dx: 250.40 100 each 3  . glucose monitoring kit (FREESTYLE) monitoring kit 1 each by Does not apply route as needed for other. 1 each 0  . Insulin Glargine (LANTUS SOLOSTAR) 100 UNIT/ML Solostar Pen Inject 10 Units into the skin daily with breakfast. 15 mL 6  . isosorbide mononitrate (IMDUR) 30 MG 24 hr tablet TAKE 1 TABLET AT BEDTIME 90 tablet 3  . Lancets (FREESTYLE) lancets USE TO CHECK SUGAR THREE TIMES A DAY 300 each 2  . LORazepam (ATIVAN) 1 MG tablet Take 1 tablet (1 mg total) by mouth at bedtime. insomnia 90 tablet 1  . meclizine (ANTIVERT) 25 MG tablet Take 1 tablet (25 mg total) by mouth daily before supper. Take extra 54m in am prn nausea    . metFORMIN (GLUCOPHAGE-XR) 500 MG 24 hr tablet Take 2 tablets (1,000 mg total) by mouth at bedtime. 180 tablet 1  . metoprolol tartrate (LOPRESSOR) 25 MG tablet TAKE 1 TABLET TWICE A DAY 180 tablet 3  . Multiple Vitamin (MULTIVITAMIN) capsule Take 1 capsule by mouth daily.      .Marland Kitchen  nitroGLYCERIN (NITROSTAT) 0.4 MG SL tablet DISSOLVE 1 TABLET UNDER THE TONGUE AS NEEDED 30 tablet 3  . potassium chloride (K-DUR,KLOR-CON) 10 MEQ tablet Take 2 tablets (20 mEq total) by mouth daily. 180 tablet 1  . prednisoLONE sodium phosphate (INFLAMASE FORTE) 1 % ophthalmic solution Place 1 drop into both eyes daily.     . RABEprazole (ACIPHEX) 20 MG tablet TAKE 1 TABLET DAILY 90 tablet 1  . SPIRIVA HANDIHALER 18 MCG inhalation capsule PLACE 1 CAPSULE INTO INHALER AND INHALE THE CONTENTS OF 1 CAPSULE DAILY 90 capsule 2  . torsemide (DEMADEX) 20 MG tablet Take 2 tablets every morning and 1 extra tablet on Monday, Wednesday and Friday if needed. 225 tablet 1  . traMADol (ULTRAM) 50 MG tablet Take 1 tablet (50 mg total) by mouth 2 (two) times daily. At 4pm and 10pm 60 tablet 3  . traZODone (DESYREL) 50 MG tablet TAKE ONE-HALF  (1/2) TO 1 TABLET AT BEDTIME AS NEEDED FOR SLEEP 90 tablet 1  . Vitamin D, Ergocalciferol, (DRISDOL) 50000 units CAPS capsule TAKE 1 CAPSULE ONCE EVERY WEEK 12 capsule 1   No current facility-administered medications for this visit.      Allergies:   Actos [pioglitazone hydrochloride]; Pioglitazone; Atenolol; Codeine; Eszopiclone; Eszopiclone; Exenatide; Morphine; Sulfa antibiotics; Sulfacetamide sodium; Vicodin [hydrocodone-acetaminophen]; Exenatide; and Hydrocodone-acetaminophen   Social History:  The patient  reports that she has never smoked. She has never used smokeless tobacco. She reports that she does not drink alcohol or use drugs.   Family History:   family history includes Cancer in her brother and mother; Heart disease in her father.    Review of Systems: Review of Systems  Constitutional: Negative.   Respiratory: Positive for shortness of breath.   Cardiovascular: Negative.   Gastrointestinal: Negative.   Musculoskeletal: Negative.        Gait instability  Neurological: Negative.   Psychiatric/Behavioral: Positive for depression.  All other systems reviewed and are negative.    PHYSICAL EXAM: VS:  BP 120/80 (BP Location: Left Arm, Patient Position: Sitting, Cuff Size: Normal)   Pulse 71   Ht 5' 4.5" (1.638 m)   Wt 137 lb 12 oz (62.5 kg)   BMI 23.28 kg/m  , BMI Body mass index is 23.28 kg/m. GEN: Well nourished, well developed, in no acute distress , on nasal cannula oxygen HEENT: normal  Neck: no JVD, carotid bruits, or masses Cardiac: RRR; no murmurs, rubs, or gallops,no edema  Respiratory:  Scattered Rales,  normal work of breathing GI: soft, nontender, nondistended, + BS MS: no deformity or atrophy  Skin: warm and dry, no rash Neuro:  Strength and sensation are intact Psych: euthymic mood, full affect    Recent Labs: 10/08/2015: BUN 12; Creatinine, Ser 0.70; Hemoglobin 11.9; Platelets 220.0; Potassium 3.8; Sodium 140    Lipid Panel Lab Results   Component Value Date   CHOL 184 10/08/2015   HDL 49.20 10/08/2015   LDLCALC 95 10/08/2015   TRIG 199.0 (H) 10/08/2015      Wt Readings from Last 3 Encounters:  11/24/15 137 lb 12 oz (62.5 kg)  10/08/15 138 lb 12.8 oz (63 kg)  09/11/15 138 lb (62.6 kg)       ASSESSMENT AND PLAN:  Coronary artery disease involving coronary bypass graft of native heart without angina pectoris - Plan: EKG 12-Lead Currently with no symptoms of angina. No further workup at this time. Continue current medication regimen.  Chronic diastolic CHF (congestive heart failure) ([place]) - Plan: EKG 12-Lead  Appears relatively euvolemic on today's visit, we'll continue torsemide 40 daily  Essential hypertension - Plan: EKG 12-Lead Blood pressure is well controlled on today's visit. No changes made to the medications.  Pure hypercholesterolemia Continue Lipitor, goal LDL less than 70  Depression, unspecified depression type Most of the visit was spent discussing her mental status, appears very depressed. Not very active, Mrs. her previous social life and activities   Total encounter time more than 25 minutes  Greater than 50% was spent in counseling and coordination of care with the patient   Disposition:   F/U  6 months   Orders Placed This Encounter  Procedures  . EKG 12-Lead     Signed, Esmond Plants, M.D., Ph.D. 11/24/2015  [facility], [place]

## 2015-11-24 NOTE — Patient Instructions (Signed)

## 2015-11-28 ENCOUNTER — Encounter: Payer: Self-pay | Admitting: Family Medicine

## 2015-12-01 NOTE — Telephone Encounter (Signed)
Please see Mychart message. I suggested she come in for appt.

## 2015-12-03 ENCOUNTER — Ambulatory Visit (INDEPENDENT_AMBULATORY_CARE_PROVIDER_SITE_OTHER): Payer: Medicare Other | Admitting: Podiatry

## 2015-12-03 DIAGNOSIS — M79676 Pain in unspecified toe(s): Secondary | ICD-10-CM | POA: Diagnosis not present

## 2015-12-03 DIAGNOSIS — B351 Tinea unguium: Secondary | ICD-10-CM

## 2015-12-03 NOTE — Progress Notes (Signed)
She presents today chief complaint of painful elongated toenails.  Objective: Toenails are thick yellow dystrophic with mycotic painful palpation.  Assessment: Pain limb saline onychomycosis.  Plan: Debridement of toenails 1 through 5 bilateral. Follow up with her in 6 months

## 2015-12-08 NOTE — Progress Notes (Signed)
* [facility]     Assessment and Plan:  Dyspnea -Multifactorial causes of dyspnea including restrictive and obstructive lung disease, chronic angina, deconditioning and likely some degree of chronic aspiration.   Bronchiectasis. Emphysema. -CT chest from 11/01/2014 shows some mild subpleural fibrosis, bronchiectasis, and anatomical emphysema. Continue oxygen and activity.  --Continue spiriva.   Chronic hypoxemic respiratory failure --Continuous O2 at 2L/min. --Suspect that this is multifactorial due to combined pulmonary disease, cardiac disease, debility and deconditioning.   --Continue activity as tolerated.   Pulmonary fibrosis.  --Likely contributing to dyspnea and lung crackles.   Congestive heart failure --Chronic congestive heart failure and volume overload with CAD, she is following with cardiology.  --Continue current cardiac regimen and diuretic.    Rhinitis -Continue flonase.    Advance care planning.   Patient reiterated today that she is at the end of life, and she understands this very well. We discussed issues related to end-of-life including end-of-life planning, state planning, healthcare planning. She has established many of these things, she tells me that she has a MOST form at the nursing home that she has discussed with the physician at her facility. I encouraged her to discuss these things with her family as well. She tells me that " I believe in life hereafter" and "I'm tired, tired of fighting" She reiterates that her code is DO NOT RESUSCITATE. 30 min spend in discussion.    Date: 12/08/2015  MRN# [id number] Christine Blanchard [birth year]   Christine Blanchard is a 82 y.o. old female seen in follow up for chief complaint of  Chief Complaint  Patient presents with  . Follow-up    20morov.  pt states breathing is baseline since last ov. pt c/o sob with exertion & occ chest discomfort.      HPI:  [name] an 82year old female  with chronic dyspnea. She has both obstructive and restrictive lung disease and possibly some degree of chronic aspiration with early changes of pulmonary fibrosis. Her course is also complicated by debility and deconditioning. She continues on 2 L of oxygen, she ambulates with a walker.She lives in assisted living. She continues to use spiriva and flonase once daily.  At her last visit, it was noted that she was having dyspnea when walking to the dining call and had to stop due to breathing issues. She was prescribed nebulizers to use before walking to the dining [place], but she noted that this gave her very severe tremors, therefore, these were stopped. She was asked to use a metered-dose inhaler instead.  CT 11/01/2014: No lung nodule was seen, there is early changes of pulmonary fibrosis seen in the subpleural areas, there is some of the symptoms, changes, as well as a areas of bronchiectasis. There is also left elevated diaphragm visible.  06/2011 6MW 725 ft., O2 98% 2L [place] HR peack 90  SIX MIN WALK 03/14/2015 10/17/2013 04/25/2013 10/17/2012 11/03/2011 06/15/2010  Supplimental Oxygen during Test? (L/min) No Yes Yes Yes Yes No  O2 Flow Rate - 2 2 2 2  -  Type - Continuous Continuous Pulse Pulse -  Tech Comments: - - Could not complete lap 3 due to leg weakness. - After walking approx 200 ft o2 sat was 93%2lpm and pulse was 80/walk stopped due to weakness//lmr pt recovered to 93% 2L with exertion    Medication:   Outpatient Encounter Prescriptions as of 12/11/2015  Medication Sig  . acetaminophen (TYLENOL) 500 MG tablet Take 500 mg by mouth  3 (three) times daily.  . AMBULATORY NON FORMULARY MEDICATION Medication Name: Please discontinue the nebulizer treatments.  . ASPIRIN LOW DOSE 81 MG chewable tablet TAKE 1 TABLET BY MOUTH ONCE DAILY  . atorvastatin (LIPITOR) 20 MG tablet TAKE 1 TABLET NIGHTLY  . BD AUTOSHIELD DUO 30G X 5 MM MISC Use as instructed with insulin Dx: E11.21  . cetirizine (ZYRTEC) 10 MG  tablet TAKE 1 TABLET DAILY  . citalopram (CELEXA) 10 MG tablet TAKE 1 TABLET DAILY  . clopidogrel (PLAVIX) 75 MG tablet TAKE 1 TABLET DAILY  . diphenhydrAMINE (BENADRYL) 25 mg capsule Take 1 capsule (25 mg total) by mouth 3 (three) times daily as needed for itching.  . docusate sodium (COLACE) 100 MG capsule Take 1 capsule (100 mg total) by mouth daily.  Marland Kitchen glucose blood (FREESTYLE LITE) test strip Use to check sugar three times daily Dx: 250.40  . glucose monitoring kit (FREESTYLE) monitoring kit 1 each by Does not apply route as needed for other.  . Insulin Glargine (LANTUS SOLOSTAR) 100 UNIT/ML Solostar Pen Inject 10 Units into the skin daily with breakfast.  . isosorbide mononitrate (IMDUR) 30 MG 24 hr tablet TAKE 1 TABLET AT BEDTIME  . Lancets (FREESTYLE) lancets USE TO CHECK SUGAR THREE TIMES A DAY  . LORazepam (ATIVAN) 1 MG tablet Take 1 tablet (1 mg total) by mouth at bedtime. insomnia  . meclizine (ANTIVERT) 25 MG tablet Take 1 tablet (25 mg total) by mouth daily before supper. Take extra 67m in am prn nausea  . metFORMIN (GLUCOPHAGE-XR) 500 MG 24 hr tablet Take 2 tablets (1,000 mg total) by mouth at bedtime.  . metoprolol tartrate (LOPRESSOR) 25 MG tablet TAKE 1 TABLET TWICE A DAY  . Multiple Vitamin (MULTIVITAMIN) capsule Take 1 capsule by mouth daily.    . nitroGLYCERIN (NITROSTAT) 0.4 MG SL tablet DISSOLVE 1 TABLET UNDER THE TONGUE AS NEEDED  . potassium chloride (K-DUR,KLOR-CON) 10 MEQ tablet Take 2 tablets (20 mEq total) by mouth daily.  . prednisoLONE sodium phosphate (INFLAMASE FORTE) 1 % ophthalmic solution Place 1 drop into both eyes daily.   . RABEprazole (ACIPHEX) 20 MG tablet TAKE 1 TABLET DAILY  . SPIRIVA HANDIHALER 18 MCG inhalation capsule PLACE 1 CAPSULE INTO INHALER AND INHALE THE CONTENTS OF 1 CAPSULE DAILY  . torsemide (DEMADEX) 20 MG tablet Take 2 tablets every morning and 1 extra tablet on Monday, Wednesday and Friday if needed.  . traMADol (ULTRAM) 50 MG tablet  Take 1 tablet (50 mg total) by mouth 2 (two) times daily. At 4pm and 10pm  . traZODone (DESYREL) 50 MG tablet TAKE ONE-HALF (1/2) TO 1 TABLET AT BEDTIME AS NEEDED FOR SLEEP  . Vitamin D, Ergocalciferol, (DRISDOL) 50000 units CAPS capsule TAKE 1 CAPSULE ONCE EVERY WEEK   No facility-administered encounter medications on file as of 12/11/2015.      Allergies:  Actos [pioglitazone hydrochloride]; Pioglitazone; Atenolol; Codeine; Eszopiclone; Eszopiclone; Exenatide; Morphine; Sulfa antibiotics; Sulfacetamide sodium; Vicodin [hydrocodone-acetaminophen]; Exenatide; and Hydrocodone-acetaminophen  Review of Systems: Gen:  Denies  fever, sweats. HEENT: Denies blurred vision. Cvc:  No dizziness, chest pain or heaviness Resp:   Denies  sputum porduction. Gi: Denies swallowing difficulty, stomach pain. constipation, bowel incontinence Gu:  Denies bladder incontinence, burning urine Ext:   No Joint pain, stiffness. Skin: No skin rash, easy bruising. Endoc:  No polyuria, polydipsia. Psych: No depression, insomnia. Other:  All other systems were reviewed and found to be negative other than what is mentioned in the HPI.  Physical Examination:   VS: BP 120/70 (BP Location: Left Arm, Cuff Size: Normal)   Pulse 78   Ht 5' 4"  (1.626 m)   Wt 141 lb (64 kg)   SpO2 95%   BMI 24.20 kg/m   General Appearance: No distress  Neuro:without focal findings,  speech slow.  HEENT: PERRLA, EOM intact. Pulmonary: normal breath sounds, bibasilar crackles.  CardiovascularNormal S1,S2.  No m/r/g.   Abdomen: Benign, Soft, non-tender. Renal:  No costovertebral tenderness  GU:  Not performed at this time. Endoc: No evident thyromegaly, no signs of acromegaly. Skin:   warm, no rash. Extremities: normal, no cyanosis, clubbing.   LABORATORY PANEL:   CBC No results for input(s): WBC, HGB, HCT, PLT in the last 168  hours. ------------------------------------------------------------------------------------------------------------------  Chemistries  No results for input(s): NA, K, CL, CO2, GLUCOSE, BUN, CREATININE, CALCIUM, MG, AST, ALT, ALKPHOS, BILITOT in the last 168 hours.  Invalid input(s): GFRCGP ------------------------------------------------------------------------------------------------------------------  Cardiac Enzymes No results for input(s): TROPONINI in the last 168 hours. ------------------------------------------------------------  RADIOLOGY:   No results found for this or any previous visit. Results for orders placed during the hospital encounter of 09/05/14  DG Chest 2 View   Narrative CLINICAL DATA:  Increase shortness of breath for 3-6 months was congestive heart failure, nonsmoker, history of coronary artery disease  EXAM: CHEST  2 VIEW  COMPARISON:  03/13/2014  FINDINGS: Status post prior CABG. Heart size and vascular pattern normal. Stable aortic calcification. Mild central bronchitic change and interstitial prominence appears stable. Lower thoracic spine mild compression deformity stable from 05/15/2013. Possible 1.5 cm pulmonary nodule in the retrocardiac area.  IMPRESSION: Stable mild diffuse bronchitic and interstitial change when compared to prior studies.  Possible pulmonary nodule for which CT thorax is recommended.  These results will be called to the ordering clinician or representative by the Radiologist Assistant, and communication documented in the PACS or zVision Dashboard.   Electronically Signed   By: Skipper Cliche M.D.   On: 09/05/2014 11:32    ------------------------------------------------------------------------------------------------------------------  Thank  you for allowing [facility] to assist in the care of your patient. Our recommendations are noted above.  Please contact us if we can be of  further service.   Marda Stalker, MD.  [facility]  Patricia Pesa, M.D.  Vilinda Boehringer, M.D.  Merton Border, M.D

## 2015-12-10 ENCOUNTER — Telehealth: Payer: Self-pay | Admitting: Family Medicine

## 2015-12-10 ENCOUNTER — Encounter: Payer: Self-pay | Admitting: Cardiovascular Disease

## 2015-12-10 NOTE — Telephone Encounter (Signed)
Pt returned your call p[lease call back at [phone]

## 2015-12-10 NOTE — Telephone Encounter (Signed)
Spoke with patient.

## 2015-12-11 ENCOUNTER — Ambulatory Visit (INDEPENDENT_AMBULATORY_CARE_PROVIDER_SITE_OTHER): Payer: Medicare Other | Admitting: Internal Medicine

## 2015-12-11 ENCOUNTER — Encounter: Payer: Self-pay | Admitting: Internal Medicine

## 2015-12-11 VITALS — BP 120/70 | HR 78 | Ht 64.0 in | Wt 141.0 lb

## 2015-12-11 DIAGNOSIS — J9611 Chronic respiratory failure with hypoxia: Secondary | ICD-10-CM | POA: Diagnosis not present

## 2015-12-11 DIAGNOSIS — R0602 Shortness of breath: Secondary | ICD-10-CM

## 2015-12-11 DIAGNOSIS — J8410 Pulmonary fibrosis, unspecified: Secondary | ICD-10-CM | POA: Diagnosis not present

## 2015-12-11 DIAGNOSIS — I25810 Atherosclerosis of coronary artery bypass graft(s) without angina pectoris: Secondary | ICD-10-CM | POA: Diagnosis not present

## 2015-12-11 DIAGNOSIS — I5032 Chronic diastolic (congestive) heart failure: Secondary | ICD-10-CM | POA: Diagnosis not present

## 2015-12-11 NOTE — Patient Instructions (Signed)
No new medications today.

## 2015-12-16 ENCOUNTER — Encounter: Payer: Self-pay | Admitting: Family Medicine

## 2015-12-16 ENCOUNTER — Other Ambulatory Visit: Payer: Self-pay | Admitting: *Deleted

## 2015-12-16 ENCOUNTER — Ambulatory Visit (INDEPENDENT_AMBULATORY_CARE_PROVIDER_SITE_OTHER): Payer: Medicare Other | Admitting: Family Medicine

## 2015-12-16 VITALS — BP 136/82 | HR 72 | Temp 98.0°F | Wt 139.2 lb

## 2015-12-16 DIAGNOSIS — F331 Major depressive disorder, recurrent, moderate: Secondary | ICD-10-CM

## 2015-12-16 DIAGNOSIS — G4700 Insomnia, unspecified: Secondary | ICD-10-CM | POA: Diagnosis not present

## 2015-12-16 DIAGNOSIS — J9611 Chronic respiratory failure with hypoxia: Secondary | ICD-10-CM

## 2015-12-16 DIAGNOSIS — I25810 Atherosclerosis of coronary artery bypass graft(s) without angina pectoris: Secondary | ICD-10-CM

## 2015-12-16 DIAGNOSIS — E1121 Type 2 diabetes mellitus with diabetic nephropathy: Secondary | ICD-10-CM

## 2015-12-16 DIAGNOSIS — R110 Nausea: Secondary | ICD-10-CM

## 2015-12-16 MED ORDER — SERTRALINE HCL 50 MG PO TABS: 50.0000 mg | ORAL_TABLET | Freq: Every day | ORAL | 3 refills | Status: DC

## 2015-12-16 NOTE — Patient Instructions (Addendum)
Change celexa to sertraline 50mg  daily. Update me with effect after 1 month.  Continue trazodone 25-50mg  at bedtime.  Change meclizine to 12.5-25mg  daily.  Return as needed or in 1 month for follow up visit.

## 2015-12-16 NOTE — Assessment & Plan Note (Addendum)
Ongoing depression/anxiety stemming from physical limitations from chronic disease now with new endorsed mood swings and increased irritability. Stressors from poor medication administration also contributing. Hesitant to increase celexa given cardiac comorbidities so will change to sertraline 50mg  daily with option to increase dose. Continue trazodone 25-50mg  nightly, lorazepam 1mg  nightly. Pt agrees with plan.

## 2015-12-16 NOTE — Progress Notes (Signed)
Pre visit review using our clinic review tool, if applicable. No additional management support is needed unless otherwise documented below in the visit note. 

## 2015-12-16 NOTE — Assessment & Plan Note (Signed)
Continue trazodone/ativan.

## 2015-12-16 NOTE — Assessment & Plan Note (Signed)
Meclizine has been more effective than zofran. Nausea seems to have improved. Suggested try to taper down on meclizine. New dose will be 12.5-25mg  at 3pm.

## 2015-12-16 NOTE — Assessment & Plan Note (Addendum)
Pt endorses low sugars to 70s but none <70. Continue current regimen. Last A1c adequate at 7.4%

## 2015-12-16 NOTE — Progress Notes (Signed)
BP 136/82   Pulse 72   Temp 98 F (36.7 C) (Oral)   Wt 139 lb 4 oz (63.2 kg)   BMI 23.90 kg/m    CC: mood swings Subjective:    Patient ID: Christine Blanchard, female    DOB: Jun 28, 1928, 82 y.o.   MRN: [id number]  HPI: Christine Blanchard is a 82 y.o. female presenting on 12/16/2015 for Mood swings and Medication Management (meclizine)   Here with son Christine Blanchard today.   Known chronic recurrent moderate MDD largely stemming from physical limitations from chronic illnesses. She is taking celexa 67m, trazodone 25-564mnightly and ativan 83m683mightly.   [facility] had [facility]. Having trouble with medication administration correctly. Struggling over lack of control of current situation.   Ongoing dizziness/vertigo with nausea - meclizine 39m62m effective.   Relevant past medical, surgical, family and social history reviewed and updated as indicated. Interim medical history since our last visit reviewed. Allergies and medications reviewed and updated. Current Outpatient Prescriptions on File Prior to Visit  Medication Sig  . acetaminophen (TYLENOL) 500 MG tablet Take 500 mg by mouth 3 (three) times daily.  . ASPIRIN LOW DOSE 81 MG chewable tablet TAKE 1 TABLET BY MOUTH ONCE DAILY  . atorvastatin (LIPITOR) 20 MG tablet TAKE 1 TABLET NIGHTLY  . BD AUTOSHIELD DUO 30G X 5 MM MISC Use as instructed with insulin Dx: E11.21  . cetirizine (ZYRTEC) 10 MG tablet TAKE 1 TABLET DAILY  . citalopram (CELEXA) 10 MG tablet TAKE 1 TABLET DAILY  . clopidogrel (PLAVIX) 75 MG tablet TAKE 1 TABLET DAILY  . diphenhydrAMINE (BENADRYL) 25 mg capsule Take 1 capsule (25 mg total) by mouth 3 (three) times daily as needed for itching.  . docusate sodium (COLACE) 100 MG capsule Take 1 capsule (100 mg total) by mouth daily.  . glMarland Kitchencose blood (FREESTYLE LITE) test strip Use to check sugar three times daily Dx: 250.40  . glucose monitoring kit (FREESTYLE) monitoring kit 1 each by Does  not apply route as needed for other.  . Insulin Glargine (LANTUS SOLOSTAR) 100 UNIT/ML Solostar Pen Inject 10 Units into the skin daily with breakfast.  . isosorbide mononitrate (IMDUR) 30 MG 24 hr tablet TAKE 1 TABLET AT BEDTIME  . Lancets (FREESTYLE) lancets USE TO CHECK SUGAR THREE TIMES A DAY  . LORazepam (ATIVAN) 1 MG tablet Take 1 tablet (1 mg total) by mouth at bedtime. insomnia  . metFORMIN (GLUCOPHAGE-XR) 500 MG 24 hr tablet Take 2 tablets (1,000 mg total) by mouth at bedtime.  . metoprolol tartrate (LOPRESSOR) 25 MG tablet TAKE 1 TABLET TWICE A DAY  . Multiple Vitamin (MULTIVITAMIN) capsule Take 1 capsule by mouth daily.    . nitroGLYCERIN (NITROSTAT) 0.4 MG SL tablet DISSOLVE 1 TABLET UNDER THE TONGUE AS NEEDED  . potassium chloride (K-DUR,KLOR-CON) 10 MEQ tablet Take 2 tablets (20 mEq total) by mouth daily.  . prednisoLONE sodium phosphate (INFLAMASE FORTE) 1 % ophthalmic solution Place 1 drop into both eyes daily.   . RABEprazole (ACIPHEX) 20 MG tablet TAKE 1 TABLET DAILY  . SPIRIVA HANDIHALER 18 MCG inhalation capsule PLACE 1 CAPSULE INTO INHALER AND INHALE THE CONTENTS OF 1 CAPSULE DAILY  . torsemide (DEMADEX) 20 MG tablet Take 2 tablets every morning and 1 extra tablet on Monday, Wednesday and Friday if needed.  . traZODone (DESYREL) 50 MG tablet TAKE ONE-HALF (1/2) TO 1 TABLET AT BEDTIME AS NEEDED FOR SLEEP  . Vitamin D,  Ergocalciferol, (DRISDOL) 50000 units CAPS capsule TAKE 1 CAPSULE ONCE EVERY WEEK   No current facility-administered medications on file prior to visit.     Review of Systems Per HPI unless specifically indicated in ROS section     Objective:    BP 136/82   Pulse 72   Temp 98 F (36.7 C) (Oral)   Wt 139 lb 4 oz (63.2 kg)   BMI 23.90 kg/m   Wt Readings from Last 3 Encounters:  12/16/15 139 lb 4 oz (63.2 kg)  12/11/15 141 lb (64 kg)  11/24/15 137 lb 12 oz (62.5 kg)    Physical Exam  Constitutional: She appears well-developed and well-nourished.  No distress.  HENT:  Mouth/Throat: Oropharynx is clear and moist. No oropharyngeal exudate.  Cardiovascular: Normal rate, regular rhythm, normal heart sounds and intact distal pulses.   No murmur heard. Pulmonary/Chest: Effort normal and breath sounds normal. No respiratory distress. She has no wheezes. She has no rales.  Coarse  Nursing note and vitals reviewed.  Results for orders placed or performed in visit on 10/08/15  Lipid panel  Result Value Ref Range   Cholesterol 184 0 - 200 mg/dL   Triglycerides 199.0 (H) 0.0 - 149.0 mg/dL   HDL 49.20 >39.00 mg/dL   VLDL 39.8 0.0 - 40.0 mg/dL   LDL Cholesterol 95 0 - 99 mg/dL   Total CHOL/HDL Ratio 4    NonHDL 134.74   Hemoglobin A1c  Result Value Ref Range   Hgb A1c MFr Bld 7.4 (H) 4.6 - 6.5 %  CBC with Differential/Platelet  Result Value Ref Range   WBC 9.6 4.0 - 10.5 K/uL   RBC 4.46 3.87 - 5.11 Mil/uL   Hemoglobin 11.9 (L) 12.0 - 15.0 g/dL   HCT 37.1 36.0 - 46.0 %   MCV 83.2 78.0 - 100.0 fl   MCHC 31.9 30.0 - 36.0 g/dL   RDW 15.7 (H) 11.5 - 15.5 %   Platelets 220.0 150.0 - 400.0 K/uL   Neutrophils Relative % 57.0 43.0 - 77.0 %   Lymphocytes Relative 30.4 12.0 - 46.0 %   Monocytes Relative 7.9 3.0 - 12.0 %   Eosinophils Relative 4.3 0.0 - 5.0 %   Basophils Relative 0.4 0.0 - 3.0 %   Neutro Abs 5.5 1.4 - 7.7 K/uL   Lymphs Abs 2.9 0.7 - 4.0 K/uL   Monocytes Absolute 0.8 0.1 - 1.0 K/uL   Eosinophils Absolute 0.4 0.0 - 0.7 K/uL   Basophils Absolute 0.0 0.0 - 0.1 K/uL  Basic metabolic panel  Result Value Ref Range   Sodium 140 135 - 145 mEq/L   Potassium 3.8 3.5 - 5.1 mEq/L   Chloride 96 96 - 112 mEq/L   CO2 38 (H) 19 - 32 mEq/L   Glucose, Bld 128 (H) 70 - 99 mg/dL   BUN 12 6 - 23 mg/dL   Creatinine, Ser 0.70 0.40 - 1.20 mg/dL   Calcium 9.6 8.4 - 10.5 mg/dL   GFR 84.45 >60.00 mL/min  VITAMIN D 25 Hydroxy (Vit-D Deficiency, Fractures)  Result Value Ref Range   VITD 71.47 30.00 - 100.00 ng/mL      Assessment & Plan:    Problem List Items Addressed This Visit    CAD (coronary artery disease) of artery bypass graft   Chronic hypoxemic respiratory failure (HCC)   Insomnia    Continue trazodone/ativan.      MDD (major depressive disorder), recurrent episode, moderate ([place]) - Primary    Ongoing depression/anxiety stemming  from physical limitations from chronic disease now with new endorsed mood swings and increased irritability. Stressors from poor medication administration also contributing. Hesitant to increase celexa given cardiac comorbidities so will change to sertraline 74m daily with option to increase dose. Continue trazodone 25-522mnightly, lorazepam 22m62mightly. Pt agrees with plan.      Nausea    Meclizine has been more effective than zofran. Nausea seems to have improved. Suggested try to taper down on meclizine. New dose will be 12.5-25mg at 3pm.      Well controlled type 2 diabetes mellitus with nephropathy ([facility]  Pt endorses low sugars to 70s but none <70. Continue current regimen. Last A1c adequate at 7.4%          Follow up plan: Return in about 4 weeks (around 01/13/2016) for follow up visit.  JavRia BushD

## 2015-12-18 ENCOUNTER — Other Ambulatory Visit: Payer: Self-pay | Admitting: *Deleted

## 2015-12-18 MED ORDER — NITROGLYCERIN 0.4 MG SL SUBL: SUBLINGUAL_TABLET | SUBLINGUAL | 3 refills | Status: DC

## 2015-12-18 MED ORDER — PREDNISOLONE SODIUM PHOSPHATE 1 % OP SOLN: 1.0000 [drp] | Freq: Every day | OPHTHALMIC | 3 refills | Status: AC

## 2015-12-29 ENCOUNTER — Other Ambulatory Visit: Payer: Self-pay | Admitting: *Deleted

## 2015-12-29 NOTE — Telephone Encounter (Signed)
Received faxed refill request from pharmacy for Tramadol Last refill 12/16/15 Last office visit 12/16/15

## 2015-12-30 MED ORDER — TRAMADOL HCL 50 MG PO TABS: 50.0000 mg | ORAL_TABLET | Freq: Two times a day (BID) | ORAL | 3 refills | Status: DC

## 2015-12-30 NOTE — Telephone Encounter (Signed)
plz phone in. 

## 2015-12-31 NOTE — Telephone Encounter (Signed)
I faxed in a 3 month supply with 1 refill to [facility] on 11/19/15. She isn't due for this. Per [name], she has her supply from mail order under lock and key with them. She doesn't want this through [facility]. I didn't call this in.

## 2016-01-02 IMAGING — US ABDOMEN ULTRASOUND
2 series · 13 of 25 positions shown · non-contrast
Comparison: None.

CLINICAL DATA: Persistent nausea with 23 lb weight loss since
February 2014, no vomiting, 1 month of intermittent right upper
quadrant pain, history of cholecystectomy.

EXAM:
ULTRASOUND ABDOMEN COMPLETE

[Series 1: abdomen ultrasound · 0.20mm/px · 12 of 72 slices shown (1 of 2)]
[im 1/72]
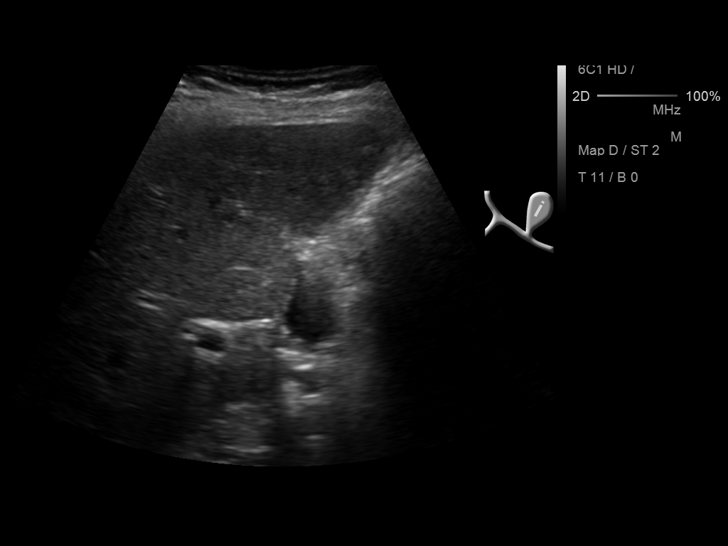
[im 7/72]
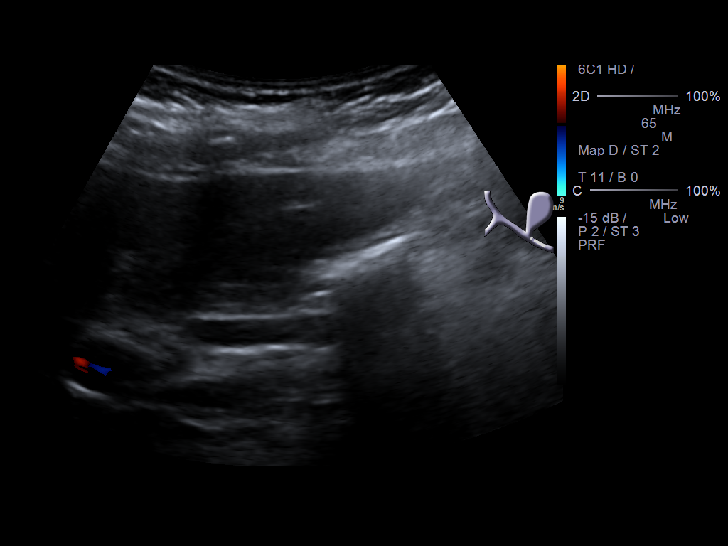
[im 13/72]
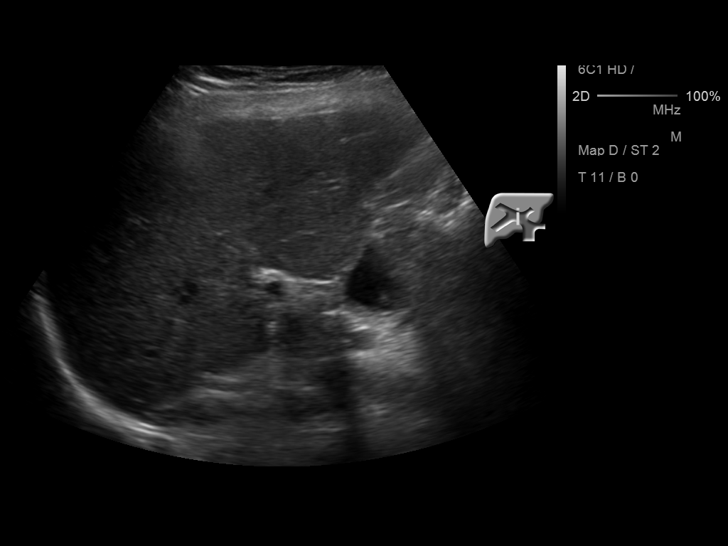
[im 19/72]
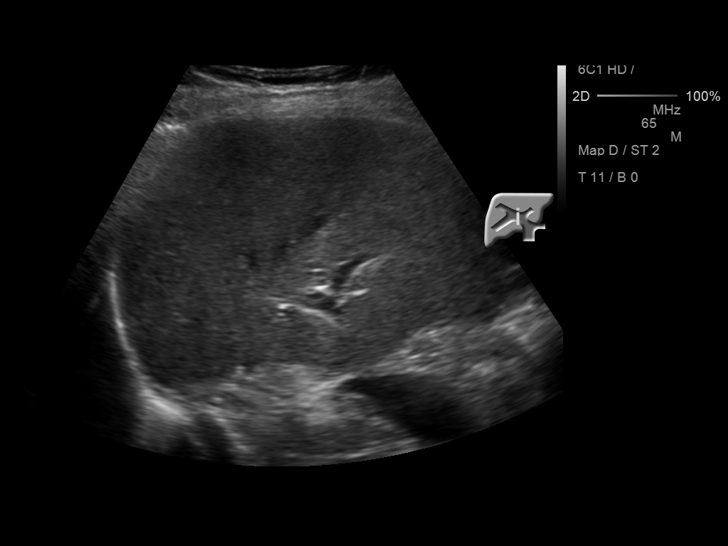
[im 25/72]
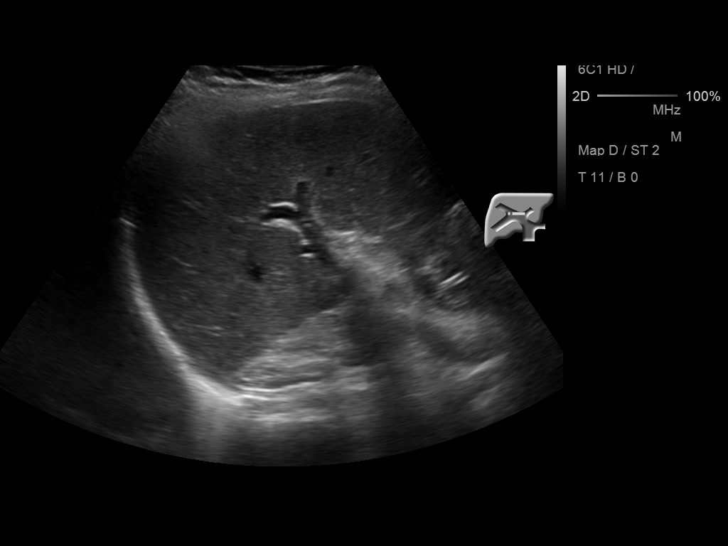
[im 31/72]
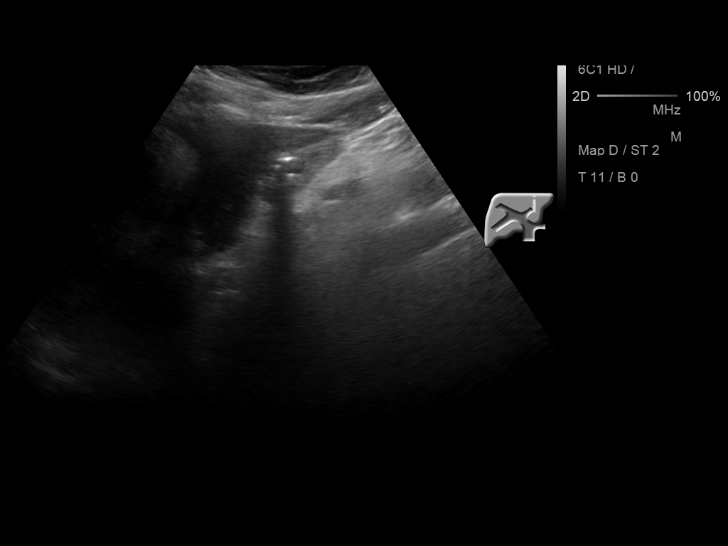
[im 38/72]
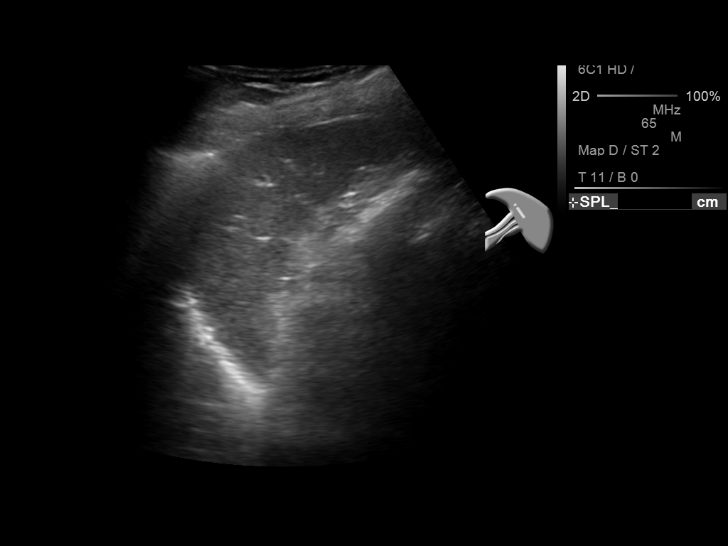
[im 44/72]
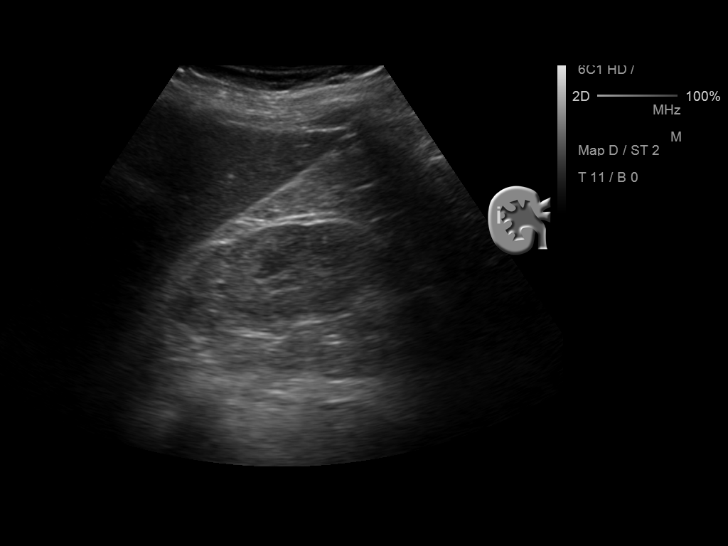
[im 50/72]
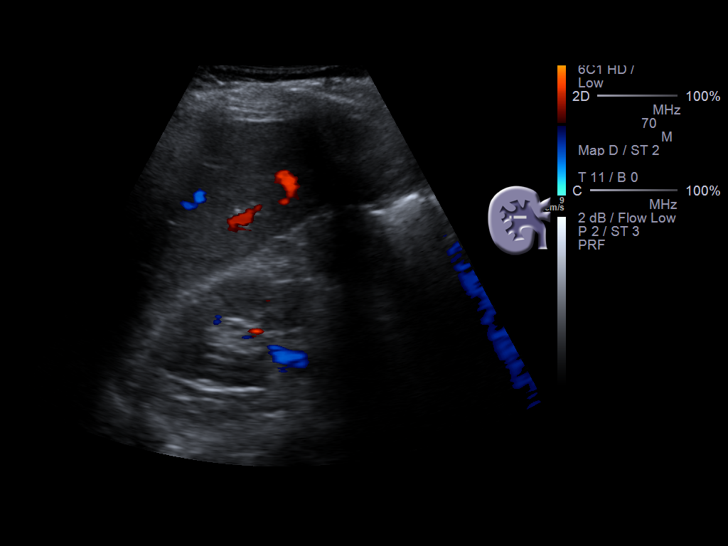
[im 56/72]
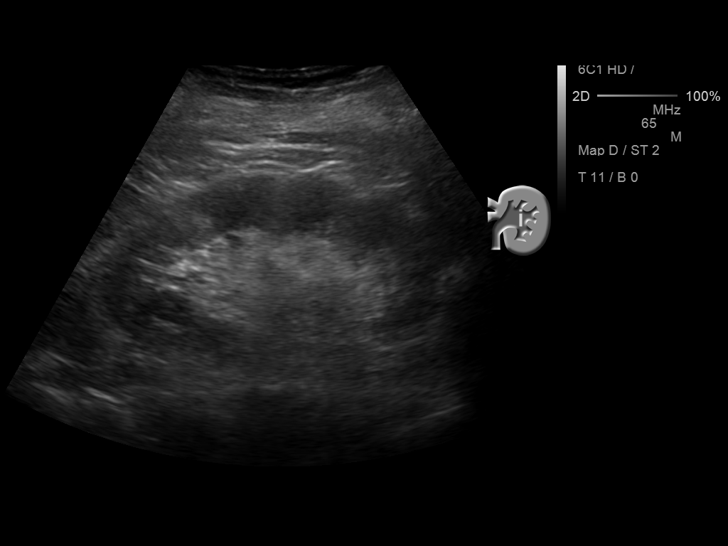
[im 62/72]
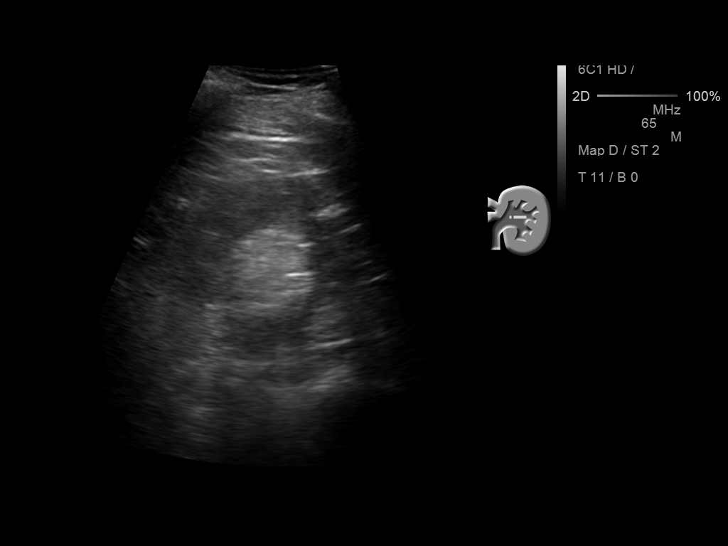
[im 68/72]
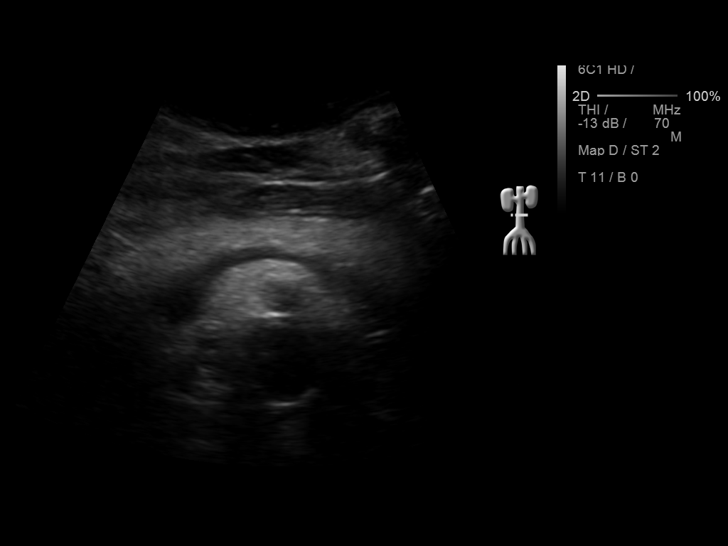

[Series 2001: abdomen ultrasound · 0.21mm/px · 1 of 1 slices shown (2 of 2)]
[im 1/1]
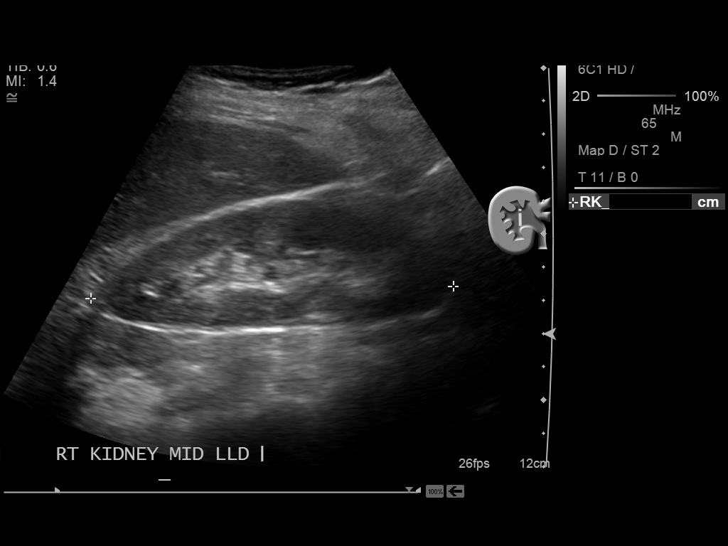

[13 of 25 positions shown; findings below may reference images not displayed]

FINDINGS: Gallbladder: The gallbladder is surgically absent.

Common bile duct: Diameter: 4.6 mm

Liver: The liver exhibits no focal mass nor ductal dilation. The
hepatic echotexture is normal.

IVC: No abnormality visualized.

Pancreas: The pancreatic head and body are unremarkable. The tail is
obscured by bowel gas.

Spleen: Size and appearance within normal limits.

Right Kidney: Length: 10.9 cm. Echogenicity within normal limits. No
mass or hydronephrosis visualized.

Left Kidney: Length: 11 cm. Echogenicity within normal limits. No
mass or hydronephrosis visualized.

Abdominal aorta: No aneurysm visualized.

Other findings: No ascites is demonstrated.
IMPRESSION: 1. The gallbladder is surgically absent. The liver, common bile
duct, spleen, and observed portions of the pancreas are normal.
2. The kidneys are normal.
3. No ascites or abdominal mass is not demonstrated.
4. Abdominal and pelvic CT scanning would be useful.

## 2016-01-05 DIAGNOSIS — H16223 Keratoconjunctivitis sicca, not specified as Sjogren's, bilateral: Secondary | ICD-10-CM | POA: Diagnosis not present

## 2016-01-05 DIAGNOSIS — E119 Type 2 diabetes mellitus without complications: Secondary | ICD-10-CM | POA: Diagnosis not present

## 2016-01-05 LAB — HM DIABETES EYE EXAM

## 2016-01-06 ENCOUNTER — Encounter: Payer: Self-pay | Admitting: *Deleted

## 2016-01-13 ENCOUNTER — Telehealth: Payer: Self-pay

## 2016-01-13 NOTE — Telephone Encounter (Signed)
Please clarify- what med? What is the dose? Thanks.

## 2016-01-13 NOTE — Telephone Encounter (Signed)
Spoke with Star and she went through every medication that patient had along with her MAR. Everything correlated, so she wasn't sure what Joeseph Amor was talking about.

## 2016-01-13 NOTE — Telephone Encounter (Signed)
PLEASE NOTE: All timestamps contained within this report are represented as [place] Standard Time. CONFIDENTIALTY NOTICE: This fax transmission is intended only for the addressee. It contains information that is legally privileged, confidential or otherwise protected from use or disclosure. If you are not the intended recipient, you are strictly prohibited from reviewing, disclosing, copying using or disseminating any of this information or taking any action in reliance on or regarding this information. If you have received this fax in error, please notify us immediately by telephone so that we can arrange for its return to [place]. Phone: [phone], Toll-Free: [phone], Fax: [phone] Page: 1 of 1 Call Id: [id number] [facility] - Client Nonclinical Telephone Record [facility] - Client Client Site [facility] Physician Ria Bush - MD Contact Type Call Who Is Calling Physician / Provider / Hospital Call Type Provider Call Message Only Reason for Call Request to send message to Office Initial Comment States with [facility], pts rx does not have correct dosage, needing a new order. States needing order to match up with rx. Pt: Christine Blanchard DOB: Nov 06, 1928 S[facility] at [facility] - [phone] Additional Comment Call Closed By: Kavin Leech Transaction Date/Time: 01/12/2016 6:13:42 PM (ET)

## 2016-01-17 ENCOUNTER — Other Ambulatory Visit: Payer: Self-pay | Admitting: Family Medicine

## 2016-01-20 ENCOUNTER — Ambulatory Visit (INDEPENDENT_AMBULATORY_CARE_PROVIDER_SITE_OTHER): Payer: Medicare Other | Admitting: Family Medicine

## 2016-01-20 ENCOUNTER — Encounter: Payer: Self-pay | Admitting: Family Medicine

## 2016-01-20 VITALS — BP 112/70 | HR 80 | Wt 139.0 lb

## 2016-01-20 DIAGNOSIS — E1121 Type 2 diabetes mellitus with diabetic nephropathy: Secondary | ICD-10-CM

## 2016-01-20 DIAGNOSIS — R110 Nausea: Secondary | ICD-10-CM

## 2016-01-20 DIAGNOSIS — R2681 Unsteadiness on feet: Secondary | ICD-10-CM

## 2016-01-20 DIAGNOSIS — F331 Major depressive disorder, recurrent, moderate: Secondary | ICD-10-CM

## 2016-01-20 DIAGNOSIS — R251 Tremor, unspecified: Secondary | ICD-10-CM

## 2016-01-20 DIAGNOSIS — I25810 Atherosclerosis of coronary artery bypass graft(s) without angina pectoris: Secondary | ICD-10-CM

## 2016-01-20 DIAGNOSIS — G250 Essential tremor: Secondary | ICD-10-CM

## 2016-01-20 DIAGNOSIS — R29898 Other symptoms and signs involving the musculoskeletal system: Secondary | ICD-10-CM

## 2016-01-20 NOTE — Assessment & Plan Note (Signed)
Ongoing, worsening. Anticipate progression of neuropathy and chronic disease state.

## 2016-01-20 NOTE — Assessment & Plan Note (Signed)
Foot exam today with neuropathy. No labs today. Continue current regimen.

## 2016-01-20 NOTE — Assessment & Plan Note (Signed)
Minimal - not using meclizine at this time.

## 2016-01-20 NOTE — Assessment & Plan Note (Signed)
Pt feels better on sertraline - continue 50mg  daily.

## 2016-01-20 NOTE — Assessment & Plan Note (Signed)
Worsening - declines additional medication at this time, reassess next visit.

## 2016-01-20 NOTE — Assessment & Plan Note (Addendum)
Worsening - declines additional medication at this time, reassess next visit. ?hypoxia related. Doubt hypoglycemia related as pt endorses normal cbg during spells.

## 2016-01-20 NOTE — Patient Instructions (Addendum)
Good to see you today. No changes today. I hope you have a Merry Christmas. Use weighted silverware for tremor. May supplement glucerna but continue 3 meals a day.  Return in 3-6 weeks for follow up visit.

## 2016-01-20 NOTE — Progress Notes (Addendum)
BP 112/70   Pulse 80   Wt 139 lb (63 kg)   SpO2 94%   BMI 23.86 kg/m    CC: 1 mo f/u visit Subjective:    Patient ID: Christine Blanchard, female    DOB: 15-Sep-1928, 82 y.o.   MRN: [id number]  HPI: Christine Blanchard is a 82 y.o. female presenting on 01/20/2016 for Follow-up   See prior note for details. Last visit we reviewed her worsening MDD (situational from physical limitations due to chronic diseases and ALF stressors) with mood swings and irritability - we changed medication from celexa to sertraline 25m daily with option to increase dosage. We continued trazodone 25-5105mnightly and lorazepam 47m66mightly. We also decreased meclizine to 12.5-25mg daily at 3pm. Endorses nausea has improved as well (not really using meclizine anymore).   Weight stable.   She finds sertraline has helped her mood. Scary "black spells" have improved - clarified to passive SI. However she notices increased fatigue, increased difficulty with ambulation, increased tremors (h/o essential tremors). Worse when fatigued. She has seen neurology previously for leg weakness due to periph neuropathy and spinal stenosis (Dr JafLoretta Plume14).   Saw eye doctor last week - no diabetic damage.   Diabetic Foot Exam - Simple   Simple Foot Form Diabetic Foot exam was performed with the following findings:  Yes 01/20/2016 10:35 AM  Visual Inspection No deformities, no ulcerations, no other skin breakdown bilaterally:  Yes Sensation Testing See comments:  Yes Pulse Check See comments:  Yes Comments Diminished to monofilament testing throughout Diminished DP bilaterally      Relevant past medical, surgical, family and social history reviewed and updated as indicated. Interim medical history since our last visit reviewed. Allergies and medications reviewed and updated. Current Outpatient Prescriptions on File Prior to Visit  Medication Sig  . acetaminophen (TYLENOL) 500 MG tablet Take 500 mg by mouth 3 (three) times  daily.  . ASPIRIN LOW DOSE 81 MG chewable tablet TAKE 1 TABLET BY MOUTH ONCE DAILY  . atorvastatin (LIPITOR) 20 MG tablet TAKE 1 TABLET NIGHTLY  . BD AUTOSHIELD DUO 30G X 5 MM MISC Use as instructed with insulin Dx: E11.21  . cetirizine (ZYRTEC) 10 MG tablet TAKE 1 TABLET DAILY  . clopidogrel (PLAVIX) 75 MG tablet TAKE 1 TABLET DAILY  . diphenhydrAMINE (BENADRYL) 25 mg capsule Take 1 capsule (25 mg total) by mouth 3 (three) times daily as needed for itching.  . docusate sodium (COLACE) 100 MG capsule Take 1 capsule (100 mg total) by mouth daily.  . gMarland Kitchenucose blood (FREESTYLE LITE) test strip Use to check sugar three times daily Dx: 250.40  . glucose monitoring kit (FREESTYLE) monitoring kit 1 each by Does not apply route as needed for other.  . isosorbide mononitrate (IMDUR) 30 MG 24 hr tablet TAKE 1 TABLET AT BEDTIME  . Lancets (FREESTYLE) lancets USE TO CHECK SUGAR THREE TIMES A DAY  . LANTUS SOLOSTAR 100 UNIT/ML Solostar Pen INJECT 10 UNITS UNDER THE SKIN DAILY WITH BREAKFAST (DIRECTION CHANGE)  . LORazepam (ATIVAN) 1 MG tablet Take 1 tablet (1 mg total) by mouth at bedtime. insomnia  . meclizine (ANTIVERT) 25 MG tablet Take 0.5-1 tablets (12.5-25 mg total) by mouth daily. At 3pm  . metFORMIN (GLUCOPHAGE-XR) 500 MG 24 hr tablet Take 2 tablets (1,000 mg total) by mouth at bedtime.  . metoprolol tartrate (LOPRESSOR) 25 MG tablet TAKE 1 TABLET TWICE A DAY  . Multiple Vitamin (MULTIVITAMIN) capsule Take 1 capsule by mouth  daily.    . nitroGLYCERIN (NITROSTAT) 0.4 MG SL tablet DISSOLVE 1 TABLET UNDER THE TONGUE AS NEEDED  . potassium chloride (K-DUR,KLOR-CON) 10 MEQ tablet Take 2 tablets (20 mEq total) by mouth daily.  . prednisoLONE sodium phosphate (INFLAMASE FORTE) 1 % ophthalmic solution Place 1 drop into both eyes daily.  . RABEprazole (ACIPHEX) 20 MG tablet TAKE 1 TABLET DAILY  . sertraline (ZOLOFT) 50 MG tablet Take 1 tablet (50 mg total) by mouth daily.  Marland Kitchen SPIRIVA HANDIHALER 18 MCG  inhalation capsule PLACE 1 CAPSULE INTO INHALER AND INHALE THE CONTENTS OF 1 CAPSULE DAILY  . torsemide (DEMADEX) 20 MG tablet Take 2 tablets every morning and 1 extra tablet on Monday, Wednesday and Friday if needed.  . traZODone (DESYREL) 50 MG tablet TAKE ONE-HALF (1/2) TO 1 TABLET AT BEDTIME AS NEEDED FOR SLEEP  . Vitamin D, Ergocalciferol, (DRISDOL) 50000 units CAPS capsule TAKE 1 CAPSULE ONCE EVERY WEEK   No current facility-administered medications on file prior to visit.     Review of Systems Per HPI unless specifically indicated in ROS section     Objective:    BP 112/70   Pulse 80   Wt 139 lb (63 kg)   SpO2 94%   BMI 23.86 kg/m   Wt Readings from Last 3 Encounters:  01/20/16 139 lb (63 kg)  12/16/15 139 lb 4 oz (63.2 kg)  12/11/15 141 lb (64 kg)    Physical Exam  Constitutional: She appears well-developed and well-nourished. No distress.  HENT:  Head: Normocephalic and atraumatic.  Right Ear: External ear normal.  Left Ear: External ear normal.  Nose: Nose normal.  Mouth/Throat: Oropharynx is clear and moist. No oropharyngeal exudate.  Eyes: Conjunctivae and EOM are normal. Pupils are equal, round, and reactive to light. No scleral icterus.  Neck: Normal range of motion. Neck supple.  Cardiovascular: Normal rate, regular rhythm, normal heart sounds and intact distal pulses.   No murmur heard. Pulmonary/Chest: Effort normal and breath sounds normal. No respiratory distress. She has no wheezes. She has no rales.  Musculoskeletal: She exhibits no edema.  See HPI for foot exam if done  Lymphadenopathy:    She has no cervical adenopathy.  Skin: Skin is warm and dry. No rash noted.  Psychiatric: She has a normal mood and affect.  Nursing note and vitals reviewed.  Results for orders placed or performed in visit on 01/06/16  HM DIABETES EYE EXAM  Result Value Ref Range   HM Diabetic Eye Exam No Retinopathy No Retinopathy      Assessment & Plan:   Problem List  Items Addressed This Visit    Benign essential tremor    Worsening - declines additional medication at this time, reassess next visit.       Gait instability    Ongoing, worsening. Anticipate progression of neuropathy and chronic disease state.       MDD (major depressive disorder), recurrent episode, moderate ([place])    Pt feels better on sertraline - continue 67m daily.       Nausea    Minimal - not using meclizine at this time.       Shaking - Primary    Worsening - declines additional medication at this time, reassess next visit. ?hypoxia related. Doubt hypoglycemia related as pt endorses normal cbg during spells.      Weakness of both legs   Well controlled type 2 diabetes mellitus with nephropathy ([place]    Foot exam today with neuropathy. No  labs today. Continue current regimen.           Follow up plan: Return in about 4 weeks (around 02/17/2016) for follow up visit.  Ria Bush, MD

## 2016-01-21 ENCOUNTER — Other Ambulatory Visit: Payer: Self-pay | Admitting: Family Medicine

## 2016-01-24 ENCOUNTER — Other Ambulatory Visit: Payer: Self-pay | Admitting: Family Medicine

## 2016-01-26 ENCOUNTER — Other Ambulatory Visit: Payer: Self-pay | Admitting: Family Medicine

## 2016-01-27 ENCOUNTER — Telehealth: Payer: Self-pay | Admitting: Family Medicine

## 2016-01-27 NOTE — Telephone Encounter (Signed)
plz phone in. 

## 2016-01-28 DIAGNOSIS — H26491 Other secondary cataract, right eye: Secondary | ICD-10-CM | POA: Diagnosis not present

## 2016-01-28 NOTE — Telephone Encounter (Signed)
Cadeema at [facility] called and said they no longer service [employer].  The rx needs to be called to ?[facility].

## 2016-01-28 NOTE — Telephone Encounter (Signed)
[facility] sent the request (??) It wasn't requested by the patient. She should still have some from mail order, regardless. Will wait for patient to request more or for mail order to request.

## 2016-01-28 NOTE — Telephone Encounter (Signed)
Rx called in as directed.   

## 2016-02-09 ENCOUNTER — Ambulatory Visit: Payer: Medicare Other | Admitting: Family Medicine

## 2016-02-09 DIAGNOSIS — L821 Other seborrheic keratosis: Secondary | ICD-10-CM | POA: Diagnosis not present

## 2016-02-09 DIAGNOSIS — L820 Inflamed seborrheic keratosis: Secondary | ICD-10-CM | POA: Diagnosis not present

## 2016-02-09 DIAGNOSIS — Z85828 Personal history of other malignant neoplasm of skin: Secondary | ICD-10-CM | POA: Diagnosis not present

## 2016-02-09 DIAGNOSIS — L578 Other skin changes due to chronic exposure to nonionizing radiation: Secondary | ICD-10-CM | POA: Diagnosis not present

## 2016-02-09 DIAGNOSIS — Z1283 Encounter for screening for malignant neoplasm of skin: Secondary | ICD-10-CM | POA: Diagnosis not present

## 2016-02-09 DIAGNOSIS — D692 Other nonthrombocytopenic purpura: Secondary | ICD-10-CM | POA: Diagnosis not present

## 2016-02-15 ENCOUNTER — Emergency Department
Admission: EM | Admit: 2016-02-15 | Discharge: 2016-02-15 | Disposition: A | Discharge status: Home / Self Care | Payer: Medicare Other | Attending: Emergency Medicine | Admitting: Emergency Medicine

## 2016-02-15 ENCOUNTER — Encounter: Payer: Self-pay | Admitting: Emergency Medicine

## 2016-02-15 ENCOUNTER — Emergency Department: Payer: Medicare Other

## 2016-02-15 DIAGNOSIS — E876 Hypokalemia: Secondary | ICD-10-CM | POA: Insufficient documentation

## 2016-02-15 DIAGNOSIS — S0990XA Unspecified injury of head, initial encounter: Secondary | ICD-10-CM | POA: Insufficient documentation

## 2016-02-15 DIAGNOSIS — S41111A Laceration without foreign body of right upper arm, initial encounter: Secondary | ICD-10-CM | POA: Diagnosis not present

## 2016-02-15 DIAGNOSIS — S0003XA Contusion of scalp, initial encounter: Secondary | ICD-10-CM | POA: Insufficient documentation

## 2016-02-15 DIAGNOSIS — N390 Urinary tract infection, site not specified: Secondary | ICD-10-CM

## 2016-02-15 DIAGNOSIS — N183 Chronic kidney disease, stage 3 (moderate): Secondary | ICD-10-CM | POA: Insufficient documentation

## 2016-02-15 DIAGNOSIS — R58 Hemorrhage, not elsewhere classified: Secondary | ICD-10-CM | POA: Diagnosis not present

## 2016-02-15 DIAGNOSIS — I2510 Atherosclerotic heart disease of native coronary artery without angina pectoris: Secondary | ICD-10-CM | POA: Insufficient documentation

## 2016-02-15 DIAGNOSIS — I5032 Chronic diastolic (congestive) heart failure: Secondary | ICD-10-CM | POA: Diagnosis not present

## 2016-02-15 DIAGNOSIS — Y999 Unspecified external cause status: Secondary | ICD-10-CM | POA: Diagnosis not present

## 2016-02-15 DIAGNOSIS — R05 Cough: Secondary | ICD-10-CM | POA: Diagnosis not present

## 2016-02-15 DIAGNOSIS — Y939 Activity, unspecified: Secondary | ICD-10-CM | POA: Insufficient documentation

## 2016-02-15 DIAGNOSIS — E860 Dehydration: Secondary | ICD-10-CM | POA: Diagnosis not present

## 2016-02-15 DIAGNOSIS — E1122 Type 2 diabetes mellitus with diabetic chronic kidney disease: Secondary | ICD-10-CM | POA: Insufficient documentation

## 2016-02-15 DIAGNOSIS — Y92129 Unspecified place in nursing home as the place of occurrence of the external cause: Secondary | ICD-10-CM | POA: Insufficient documentation

## 2016-02-15 DIAGNOSIS — W1809XA Striking against other object with subsequent fall, initial encounter: Secondary | ICD-10-CM | POA: Diagnosis not present

## 2016-02-15 DIAGNOSIS — I130 Hypertensive heart and chronic kidney disease with heart failure and stage 1 through stage 4 chronic kidney disease, or unspecified chronic kidney disease: Secondary | ICD-10-CM | POA: Diagnosis not present

## 2016-02-15 DIAGNOSIS — S4991XA Unspecified injury of right shoulder and upper arm, initial encounter: Secondary | ICD-10-CM | POA: Diagnosis present

## 2016-02-15 LAB — URINALYSIS, COMPLETE (UACMP) WITH MICROSCOPIC
Bilirubin Urine: NEGATIVE
Glucose, UA: NEGATIVE mg/dL
Hgb urine dipstick: NEGATIVE
Ketones, ur: NEGATIVE mg/dL
Nitrite: POSITIVE — AB
PH: 5 (ref 5.0–8.0)
Protein, ur: NEGATIVE mg/dL
SPECIFIC GRAVITY, URINE: 1.015 (ref 1.005–1.030)

## 2016-02-15 LAB — CBC WITH DIFFERENTIAL/PLATELET
BASOS ABS: 0 10*3/uL (ref 0–0.1)
BASOS PCT: 1 %
EOS ABS: 0.1 10*3/uL (ref 0–0.7)
Eosinophils Relative: 1 %
HCT: 36.3 % (ref 35.0–47.0)
HEMOGLOBIN: 11.8 g/dL — AB (ref 12.0–16.0)
Lymphocytes Relative: 30 %
Lymphs Abs: 2.2 10*3/uL (ref 1.0–3.6)
MCH: 26.2 pg (ref 26.0–34.0)
MCHC: 32.5 g/dL (ref 32.0–36.0)
MCV: 80.6 fL (ref 80.0–100.0)
MONOS PCT: 12 %
Monocytes Absolute: 0.9 10*3/uL (ref 0.2–0.9)
Neutro Abs: 4.1 10*3/uL (ref 1.4–6.5)
Neutrophils Relative %: 56 %
Platelets: 178 10*3/uL (ref 150–440)
RBC: 4.5 MIL/uL (ref 3.80–5.20)
RDW: 16.2 % — ABNORMAL HIGH (ref 11.5–14.5)
WBC: 7.4 10*3/uL (ref 3.6–11.0)

## 2016-02-15 LAB — BASIC METABOLIC PANEL
ANION GAP: 11 (ref 5–15)
BUN: 26 mg/dL — ABNORMAL HIGH (ref 6–20)
CHLORIDE: 96 mmol/L — AB (ref 101–111)
CO2: 33 mmol/L — ABNORMAL HIGH (ref 22–32)
CREATININE: 1.08 mg/dL — AB (ref 0.44–1.00)
Calcium: 8.8 mg/dL — ABNORMAL LOW (ref 8.9–10.3)
GFR calc non Af Amer: 45 mL/min — ABNORMAL LOW (ref >60)
GFR, EST AFRICAN AMERICAN: 53 mL/min — AB (ref >60)
Glucose, Bld: 117 mg/dL — ABNORMAL HIGH (ref 65–99)
Potassium: 3.3 mmol/L — ABNORMAL LOW (ref 3.5–5.1)
SODIUM: 140 mmol/L (ref 135–145)

## 2016-02-15 LAB — TROPONIN I

## 2016-02-15 MED ORDER — DEXTROSE 5 % IV SOLN: 1.0000 g | Freq: Once | INTRAVENOUS | Status: DC

## 2016-02-15 MED ORDER — AZITHROMYCIN 250 MG PO TABS: ORAL_TABLET | ORAL | 0 refills | Status: DC

## 2016-02-15 MED ORDER — CEFTRIAXONE SODIUM-DEXTROSE 1-3.74 GM-% IV SOLR
1.0000 g | Freq: Once | INTRAVENOUS | Status: AC
  Administered 2016-02-15: 1 g via INTRAVENOUS
  Filled 2016-02-15: qty 50

## 2016-02-15 MED ORDER — AZITHROMYCIN 500 MG PO TABS
500.0000 mg | ORAL_TABLET | Freq: Once | ORAL | Status: AC
  Administered 2016-02-15: 500 mg via ORAL
  Filled 2016-02-15: qty 1

## 2016-02-15 MED ORDER — SODIUM CHLORIDE 0.9 % IV BOLUS (SEPSIS)
1000.0000 mL | Freq: Once | INTRAVENOUS | Status: AC
  Administered 2016-02-15: 1000 mL via INTRAVENOUS

## 2016-02-15 MED ORDER — MICROFIBRILLAR COLL HEMOSTAT EX POWD
CUTANEOUS | Status: AC
  Administered 2016-02-15: 1 g via TOPICAL
  Filled 2016-02-15: qty 5

## 2016-02-15 MED ORDER — MICROFIBRILLAR COLL HEMOSTAT EX POWD
1.0000 g | Freq: Once | CUTANEOUS | Status: AC
  Administered 2016-02-15: 1 g via TOPICAL

## 2016-02-15 MED ORDER — POTASSIUM CHLORIDE CRYS ER 20 MEQ PO TBCR
40.0000 meq | EXTENDED_RELEASE_TABLET | Freq: Once | ORAL | Status: AC
  Administered 2016-02-15: 40 meq via ORAL
  Filled 2016-02-15: qty 2

## 2016-02-15 MED ORDER — CEPHALEXIN 500 MG PO CAPS: 500.0000 mg | ORAL_CAPSULE | Freq: Two times a day (BID) | ORAL | 0 refills | Status: DC

## 2016-02-15 NOTE — ED Notes (Signed)
Dr. Reita Cliche informed that patient skin tear is still bleeding through pressure dressing. Verbal order given to apply surgicel, non-stick dressing and ace wrap.

## 2016-02-15 NOTE — ED Provider Notes (Signed)
[facility] Emergency Department Provider Note ____________________________________________   I have reviewed the triage vital signs and the triage nursing note.  HISTORY  Chief Complaint Fall   Historian Patient  HPI Christine Blanchard is a 83 y.o. female who is sent in from her nursing home after a fall last night and she sustained a bump to the back of her head as well as right arm skin tear. Apparently the bandage on her arm blood through an She was sent in this morning for evaluation. She does take Plavix. Patient herself seems to be consistent reasonable historian and states that she has essential tremor and it caused her to fall yesterday. She states that she struck her head on the back of the bed. She states that her walker cause the right arm skin tears.  She's not having pain in her arm. No neck pain. No chest pain.    Past Medical History:  Diagnosis Date  . Allergic rhinitis   . Benign essential tremor    toprol XL and remeron  . Breathing difficulty 2011   on O2 since 10/2009  . CAD (coronary artery disease)    severe, s/p 4 cardiac caths, minimally invasive CABG @[facility] , ICA stent 2004 then LAD stent 2005, MEDICAL MANAGEMENT ONLY, MAY USE REPEATED SL NITRO  . Chronic kidney disease    stage 3 kidney disease.  . CVA (cerebral infarction)    after a stent, minimal R residual weakness, affects speech when tired, ?mild dysequilibrium  . Diabetes mellitus 2003   previously saw Dr. Elyse Hsu  . Diastolic CHF ([place]) 03/9474   grade 1, normal EF, mildly dilated LA, normal PA pressures  . Dry senile macular degeneration    s/p corneal transplants ([facility])  . Fracture of femoral neck, right ([place]) 03/2014   s/p surgery  . Gait instability   . GERD (gastroesophageal reflux disease) 2004  . Hearing loss of both ears 08/2010   audiology eval 08/2010 - mod sensorineural heaing loss, rec trial digital hearing aids and re eval yearly to monitor  . Herpes  zoster ophthalmicus 2009   history  . History of chicken pox    has had shingles shot  . History of diverticulitis of colon 1991, 1992, 1994  . History of kidney stones 1952  . Hyperlipidemia   . Hypertension   . Leg weakness, bilateral 2014   s/p neuro eval - thought multifactorial (diabetic neuropathy, lumbar stenosis, and chronic disease deconditioning).  Conservative management recommended  . Lumbar spinal stenosis    with severe spondylosis, s/p laminotomy  . Squamous cell skin cancer, upper arm 03/2015   R upper arm Nehemiah Massed)  . Syncope 2005   s/p w/u including CT scan and 24 hour Holter  . Urinary incontinence    stress/urge, per D. Tannenbaum/[place]  . Vitamin D deficiency    on 50k units weekly  . Wedge compression fracture of T10 vertebra ([place]) 05/28/2013   by CXR    Patient Active Problem List   Diagnosis Date Noted  . Chronic pain 10/08/2015  . Leg wound, left 04/30/2015  . Sudden left hearing loss 04/16/2015  . Left shoulder pain 09/19/2014  . Pruritic condition 06/04/2014  . Advanced care planning/counseling discussion 06/04/2014  . DNR (do not resuscitate) 06/04/2014  . Nausea 09/05/2013  . Shaking 08/29/2013  . LLQ abdominal pain 07/18/2013  . CKD (chronic kidney disease) stage 3, GFR 30-59 ml/min 01/18/2013  . CAD (coronary artery disease) of artery bypass graft  12/27/2012  . Weakness of both legs 06/25/2012  . Asymptomatic bacteriuria 02/07/2012  . Chronic diastolic CHF (congestive heart failure) ([place]) 12/17/2011  . Insomnia 06/25/2011  . Chronic hypoxemic respiratory failure ([place]) 03/10/2011  . MDD (major depressive disorder), recurrent episode, moderate ([place]) 02/05/2011  . Benign essential tremor 02/05/2011  . Dysphagia 01/14/2011  . Gait instability 12/16/2010  . Vitamin D deficiency   . Medicare annual wellness visit, subsequent 08/04/2010  . Allergic rhinitis, seasonal 06/11/2010  . Hyperlipidemia 03/02/2010  . Coronary artery disease  10/30/2009  . History of embolic stroke 64/40/3474  . Well controlled type 2 diabetes mellitus with nephropathy ([place]) 10/03/2009  . Essential hypertension 10/03/2009  . DYSPNEA 10/03/2009    Past Surgical History:  Procedure Laterality Date  . APPENDECTOMY  1948  . BREAST BIOPSY  2001   x2, both benign, last mammo 2011, last pap smear 2011  . CARDIAC CATHETERIZATION  2009   x5  . CARDIOVASCULAR STRESS TEST  2010   nuclear, normal  . CATARACT EXTRACTION     bilateral, corneal dystrophy  . CERVICAL FUSION  2002   anterior decompression and fusion C6/7  . CHOLECYSTECTOMY  1985  . CORNEAL TRANSPLANT     x3  . CORONARY ARTERY BYPASS GRAFT  2004  . DEXA  2005   normal  . HIP FRACTURE SURGERY Right 03/2014   closed reduction percutaneous pinning of R femoral neck fracture ([place])  . LUMBAR SPINE SURGERY  2010   laminotomy (L2/3, 3/4)  . PERCUTANEOUS CORONARY STENT INTERVENTION (PCI-S)  2005   stents x 2 (intermediate and LAD), 2nd complicated by stroke    Prior to Admission medications   Medication Sig Start Date End Date Taking? Authorizing Provider  acetaminophen (TYLENOL) 500 MG tablet Take 500 mg by mouth 3 (three) times daily.   Yes Historical Provider, MD  ASPIRIN LOW DOSE 81 MG chewable tablet TAKE 1 TABLET BY MOUTH ONCE DAILY 10/01/15  Yes Ria Bush, MD  atorvastatin (LIPITOR) 20 MG tablet TAKE 1 TABLET NIGHTLY 11/24/15  Yes Ria Bush, MD  clopidogrel (PLAVIX) 75 MG tablet TAKE 1 TABLET DAILY 08/25/15  Yes Minna Merritts, MD  docusate sodium (COLACE) 100 MG capsule Take 1 capsule (100 mg total) by mouth daily. 11/29/13  Yes Ria Bush, MD  isosorbide mononitrate (IMDUR) 30 MG 24 hr tablet TAKE 1 TABLET AT BEDTIME 11/24/15  Yes Minna Merritts, MD  LANTUS SOLOSTAR 100 UNIT/ML Solostar Pen INJECT 10 UNITS UNDER THE SKIN DAILY WITH BREAKFAST (DIRECTION CHANGE) 01/19/16  Yes Ria Bush, MD  LORazepam (ATIVAN) 1 MG tablet Take 1 tablet (1 mg  total) by mouth at bedtime. insomnia 08/15/15  Yes Ria Bush, MD  metFORMIN (GLUCOPHAGE) 500 MG tablet TAKE 1 TABLET TWICE A DAY WITH MEALS (TEMPORARY COURSE WHILE EXTENDED RELEASE FORMULATION OUT OF STOCK) 01/27/16  Yes Ria Bush, MD  metFORMIN (GLUCOPHAGE-XR) 500 MG 24 hr tablet Take 2 tablets (1,000 mg total) by mouth at bedtime. 08/25/15  Yes Ria Bush, MD  metoprolol tartrate (LOPRESSOR) 25 MG tablet TAKE 1 TABLET TWICE A DAY 08/25/15  Yes Minna Merritts, MD  Multiple Vitamin (MULTIVITAMIN) capsule Take 1 capsule by mouth daily.     Yes Historical Provider, MD  potassium chloride (K-DUR,KLOR-CON) 10 MEQ tablet Take 2 tablets (20 mEq total) by mouth daily. 02/04/15  Yes Ria Bush, MD  prednisoLONE sodium phosphate (INFLAMASE FORTE) 1 % ophthalmic solution Place 1 drop into both eyes daily. 12/18/15  Yes Ria Bush,  MD  RABEprazole (ACIPHEX) 20 MG tablet TAKE 1 TABLET DAILY 01/21/16  Yes Ria Bush, MD  sertraline (ZOLOFT) 50 MG tablet Take 1 tablet (50 mg total) by mouth daily. 12/16/15  Yes Ria Bush, MD  SPIRIVA HANDIHALER 18 MCG inhalation capsule PLACE 1 CAPSULE INTO INHALER AND INHALE THE CONTENTS OF 1 CAPSULE DAILY 08/29/15  Yes Laverle Hobby, MD  torsemide (DEMADEX) 20 MG tablet Take 2 tablets every morning and 1 extra tablet on Monday, Wednesday and Friday if needed. 11/10/15  Yes Ria Bush, MD  traMADol (ULTRAM) 50 MG tablet TAKE ONE TABLET BY MOUTH 2 TIMES A DAY AT 3PM AND 10PM 01/27/16  Yes Ria Bush, MD  traZODone (DESYREL) 50 MG tablet TAKE ONE-HALF (1/2) TO 1 TABLET AT BEDTIME AS NEEDED FOR SLEEP 08/25/15  Yes Ria Bush, MD  Vitamin D, Ergocalciferol, (DRISDOL) 50000 units CAPS capsule TAKE 1 CAPSULE ONCE EVERY WEEK 09/19/15  Yes Ria Bush, MD  azithromycin [facility]) 250 MG tablet 1 tab by mouth daily for 4 days 02/15/16   Lisa Roca, MD  BD AUTOSHIELD DUO 30G X 5 MM MISC Use as instructed with insulin Dx:  E11.21 04/03/15   Ria Bush, MD  cephALEXin (KEFLEX) 500 MG capsule Take 1 capsule (500 mg total) by mouth 2 (two) times daily. 02/15/16   Lisa Roca, MD  cetirizine (ZYRTEC) 10 MG tablet Take 1 tablet (10 mg total) by mouth daily as needed for allergies. 01/24/16   Ria Bush, MD  glucose blood (FREESTYLE LITE) test strip Use to check sugar three times daily Dx: 250.40 08/29/13   Ria Bush, MD  glucose monitoring kit (FREESTYLE) monitoring kit 1 each by Does not apply route as needed for other. 01/08/14   Ria Bush, MD  Lancets (FREESTYLE) lancets USE TO CHECK SUGAR THREE TIMES A DAY 10/28/14   Ria Bush, MD  meclizine (ANTIVERT) 25 MG tablet Take 0.5-1 tablets (12.5-25 mg total) by mouth daily as needed for nausea. 01/24/16   Ria Bush, MD  nitroGLYCERIN (NITROSTAT) 0.4 MG SL tablet DISSOLVE 1 TABLET UNDER THE TONGUE AS NEEDED 12/18/15   Ria Bush, MD    Allergies  Allergen Reactions  . Actos [Pioglitazone Hydrochloride] Shortness Of Breath    CHF  . Pioglitazone Shortness Of Breath    CHF REACTION: heart failure  . Atenolol Other (See Comments)    lethargy  . Codeine     REACTION: vomiting  . Eszopiclone Other (See Comments)    Nightmares  . Eszopiclone   . Exenatide Nausea Only  . Morphine     REACTION: nausea  . Sulfa Antibiotics   . Sulfacetamide Sodium   . Vicodin [Hydrocodone-Acetaminophen] Nausea And Vomiting  . Exenatide Nausea Only  . Hydrocodone-Acetaminophen Nausea And Vomiting    Family History  Problem Relation Age of Onset  . Cancer Mother     colon  . Heart disease Father   . Cancer Brother     bladder    Social History Social History  Substance Use Topics  . Smoking status: Never Smoker  . Smokeless tobacco: Never Used  . Alcohol use No    Review of Systems  Constitutional: Negative for fever. Eyes: Negative for visual changes. ENT: Negative for sore throat. Cardiovascular: Negative for chest  pain. Respiratory: Negative for shortness of breath. Gastrointestinal: Negative for abdominal pain, vomiting and diarrhea. Genitourinary: Negative for dysuria. Musculoskeletal: Negative for back pain. Skin: Negative for rash. Neurological: Negative for headache. 10 point Review of Systems otherwise negative ____________________________________________  PHYSICAL EXAM:  VITAL SIGNS: ED Triage Vitals  Enc Vitals Group     BP 02/15/16 0854 128/77     Pulse Rate 02/15/16 0854 75     Resp 02/15/16 0854 16     Temp 02/15/16 0854 98.4 F (36.9 C)     Temp Source 02/15/16 0854 Oral     SpO2 02/15/16 0854 96 %     Weight --      Height --      Head Circumference --      Peak Flow --      Pain Score 02/15/16 0848 5     Pain Loc --      Pain Edu? --      Excl. in [place]? --      Constitutional: Alert and oriented. Well appearing and in no distress. HEENT   Head: Normocephalic.  Hematoma posterior scalp.      Eyes: Conjunctivae are normal. PERRL. Normal extraocular movements.      Ears:         Nose: No congestion/rhinnorhea.   Mouth/Throat: Mucous membranes are moist.   Neck: No stridor.  C-spine nontender to palpation. Cardiovascular/Chest: Normal rate, regular rhythm.  No murmurs, rubs, or gallops. Respiratory: Normal respiratory effort without tachypnea nor retractions. Breath sounds are clear and equal bilaterally. No wheezes/rales/rhonchi. Gastrointestinal: Soft. No distention, no guarding, no rebound. Nontender.    Genitourinary/rectal:Deferred Musculoskeletal: No bony tenderness and full range of motion in 4 extremities. Right forearm has multiple superficial but complicated skin tears which are oozing without pressure. Neurologic:  Normal speech and language. No gross or focal neurologic deficits are appreciated. Skin:  Skin is warm, dry and intact. No rash noted. Psychiatric: Mood and affect are normal. Speech and behavior are normal. Patient exhibits appropriate  insight and judgment.   ____________________________________________  LABS (pertinent positives/negatives)  Labs Reviewed  CBC WITH DIFFERENTIAL/PLATELET - Abnormal; Notable for the following:       Result Value   Hemoglobin 11.8 (*)    RDW 16.2 (*)    All other components within normal limits  BASIC METABOLIC PANEL - Abnormal; Notable for the following:    Potassium 3.3 (*)    Chloride 96 (*)    CO2 33 (*)    Glucose, Bld 117 (*)    BUN 26 (*)    Creatinine, Ser 1.08 (*)    Calcium 8.8 (*)    GFR calc non Af Amer 45 (*)    GFR calc Af Amer 53 (*)    All other components within normal limits  URINALYSIS, COMPLETE (UACMP) WITH MICROSCOPIC - Abnormal; Notable for the following:    Color, Urine YELLOW (*)    APPearance HAZY (*)    Nitrite POSITIVE (*)    Leukocytes, UA SMALL (*)    Bacteria, UA MANY (*)    Squamous Epithelial / LPF 0-5 (*)    All other components within normal limits  URINE CULTURE  TROPONIN I    ____________________________________________    EKG I, Lisa Roca, MD, the attending physician have personally viewed and interpreted all ECGs.  80 bpm. Normal sinus rhythm. Narrow QRS. Normal axis. Nonspecific ST and T-wave ____________________________________________  RADIOLOGY All Xrays were viewed by me. Imaging interpreted by Radiologist.  CT head without contrast:  IMPRESSION: 1. Occipital soft tissue swelling without underlying fracture. 2. No intracranial hemorrhage. 3. Stable anterior left frontal lobe infarct. 4. Stable atrophy and diffuse white matter disease.   Chest x-ray: IMPRESSION: 1. Indistinct airspace  opacity medially at the left lung base-early pneumonia is not excluded. 2. Mild chronic interstitial accentuation peripherally. 3. Atherosclerotic aortic arch. __________________________________________  PROCEDURES  Procedure(s) performed: None  Critical Care performed:  None  ____________________________________________   ED COURSE / ASSESSMENT AND PLAN  Pertinent labs & imaging results that were available during my care of the patient were reviewed by me and considered in my medical decision making (see chart for details).  Ms. Paiz was sent in for right forearm skin tears oozing through bandage at night. Nurse noted it was dried blood and had to wet it to get the bandage off. Nonadherent bandage was placed on top, patient did have continued oozing. Pressure dressing was placed with nonadherent gauze and an Ace wrap.  We discussed what to watch for in terms of neurovascular compromise of the arm although I don't think it's too tight as applied.  She also struck her head and does have a hematoma, and given the fact that she is on Plavix and it's clear that is having some effect in terms of very thin blood, I did discuss with her obtaining head CT to rule out intracranial trauma.  C-spine cleared clinically.  Overall she does not have any neurologic deficits, and it seems like she is very consistent in her history in terms of not being ill recently and no additional medical symptoms. We discussed whether or not to obtain any additional laboratory testing or urinalysis in terms of risk factors for why she may have fallen, but she is pretty clear that with her essential tremor that caused her fall and she does not want to pursue any additional workup.   When daughter got here, she thought that her mom was slightly altered from baseline because she was not very feisty. She also noted a bit of a cough since Tuesday although she does wear home O2 she is not hypoxic to back. She's not had fever.  Chest x-ray and laboratory studies including urinalysis were obtained.  Urinalysis is nitrite positive UTI. Laboratory studies show mildly decreased kidney function, and I will hydrate with 1 L fluid here and one dose of Rocephin IV for urinary tract infection. We'll  discharge with Keflex prescription and urine culture was sent.  No clinical suspicion for sepsis.  Chest x-ray read as possible early pneumonia, given the cough, I will go ahead and treat with azithromycin as well.  Skin tear was still oozing after Surgicel and Ace wrap, and so Avitene was tried and applied followed by a pressure/Ace wrap dressing with apparent control.    CONSULTATIONS:   None   Patient / Family / Caregiver informed of clinical course, medical decision-making process, and agree with plan.   I discussed return precautions, follow-up instructions, and discharge instructions with patient and/or family.   ___________________________________________   FINAL CLINICAL IMPRESSION(S) / ED DIAGNOSES   Final diagnoses:  Skin tear of upper arm without complication, right, initial encounter  Scalp hematoma, initial encounter  Urinary tract infection without hematuria, site unspecified  Hypokalemia  Dehydration              Note: This dictation was prepared with Dragon dictation. Any transcriptional errors that result from this process are unintentional    Lisa Roca, MD 02/15/16 1535

## 2016-02-15 NOTE — Discharge Instructions (Addendum)
You were evaluated after a fall, and a dressing was applied to the right arm skin tears. Skin tears needed to be treated with nonadherent dressing, change once daily monitoring for any signs of infection which could include fever, redness, pain, or drainage.  You have found to have urinary tract infection, and treated with one dose in the emergency department which will cover U until about 3:30 PM tomorrow. You're discharged with him about Keflex for urinary tract infection.  Your chest x-ray showed possibly early pneumonia and you're given azithromycin first dose here in the emergency department. Start tomorrow with azithromycin prescription.  Return to the emergency department for any new or worsening condition including weakness, numbness, signs of infection, off balance, confusion or altered mental status, or any other symptoms concerning to you.

## 2016-02-15 NOTE — ED Triage Notes (Signed)
Patient brought in by [facility] from [facility]. Patient fell yesterday and obtained a skin tear to her right arm.  EMS states that during the night patient bled through her bandage. EMS states that they also noted a hematoma to the back of the head. Patient is A & O x 4 on arrival.

## 2016-02-15 NOTE — ED Notes (Signed)
Patient given Diet Coke to drink, OK per Dr. Reita Cliche

## 2016-02-15 NOTE — ED Notes (Addendum)
Non-stick dressing and ace wrap applied to patients right arm. Strong pulses noted, cap refill less than 3 seconds.

## 2016-02-15 NOTE — ED Notes (Signed)
Patient transported to [facility] by Wendelyn Breslow, [place]

## 2016-02-15 NOTE — ED Notes (Signed)
Patient daughter came to pick patient up, daughter states that patient is not acting like herself and does not look like herself. Patient daughter request that further work-up be done.  Patient currently has cough, states that she has had a cough for 1 week. Patient states that everyone in the assisted living where she lives has been sick with respiratory symptoms.  This RN spoke with Dr. Reita Cliche and relayed daughters concerns. Verbal orders given for CBC, BMP, Troponin, EKG, urinalysis, and Chest x-ray.

## 2016-02-15 NOTE — ED Notes (Signed)
Dr. Reita Cliche informed that patients skin tear is still bleeding with surgicel and pressure dressing. Verbal order given for Avitene.

## 2016-02-18 LAB — URINE CULTURE

## 2016-02-19 ENCOUNTER — Ambulatory Visit: Payer: Medicare Other | Admitting: Family Medicine

## 2016-02-23 ENCOUNTER — Encounter: Payer: Self-pay | Admitting: Primary Care

## 2016-02-23 ENCOUNTER — Other Ambulatory Visit: Payer: Self-pay | Admitting: Family Medicine

## 2016-02-23 ENCOUNTER — Ambulatory Visit (INDEPENDENT_AMBULATORY_CARE_PROVIDER_SITE_OTHER): Payer: Medicare Other | Admitting: Primary Care

## 2016-02-23 VITALS — BP 118/70 | HR 76 | Temp 98.0°F | Ht 64.0 in | Wt 125.8 lb

## 2016-02-23 DIAGNOSIS — N289 Disorder of kidney and ureter, unspecified: Secondary | ICD-10-CM | POA: Diagnosis not present

## 2016-02-23 LAB — BASIC METABOLIC PANEL
BUN: 15 mg/dL (ref 6–23)
CALCIUM: 9.9 mg/dL (ref 8.4–10.5)
CO2: 36 mEq/L — ABNORMAL HIGH (ref 19–32)
CREATININE: 0.75 mg/dL (ref 0.40–1.20)
Chloride: 95 mEq/L — ABNORMAL LOW (ref 96–112)
GFR: 77.92 mL/min (ref >60.00)
Glucose, Bld: 127 mg/dL — ABNORMAL HIGH (ref 70–99)
Potassium: 3.6 mEq/L (ref 3.5–5.1)
Sodium: 139 mEq/L (ref 135–145)

## 2016-02-23 NOTE — Progress Notes (Signed)
Subjective:    Patient ID: Christine Blanchard, female    DOB: 18-Oct-1928, 83 y.o.   MRN: [id number]  HPI  Christine Blanchard is an 83 year old female who presents today for Emergency Department follow up.  She presented to [facility] ED on 02/15/16 with a chief complaint of fall. She currently resides in assisted living who sent her over. She sustained a "bump" to her occipital lobe and right arm skin tear. She is managed on Plavix and has an essential tremor which was thought to be the cause of her fall. Her daughter reported changes in mental status and a cough that had been present for sevearl days prior to her fall.  During her ED stay she underwent evaluation through CT head (negative), UA (nitrite positive), CMP (decreased kidney function), Chest xray (suggestive of early pneumonia). She was treated with IV Rocephin for UTI and rehydrated with 1 liter of normal saline. Her skin tear continued to bleed despite pressure dressing so Avitene was applied with pressure dressing with better control. She was released home later that day with her daughter with prescriptions for Azithromycin and Keflex.  Since her ED visit she's completed the Azithromycin and is still working on her Keflex. She's feeling about the same. Her cough has improved but her energy level is low. She's been getting dressing changes through the assisted living facility daily. She is compliant to her home oxygen. Her daughter has noticed improvement in her mental status since her ED evaluation.  Review of Systems  Constitutional: Positive for fatigue.  Cardiovascular: Negative for chest pain.  Gastrointestinal: Negative for abdominal pain.  Genitourinary: Negative for dysuria.  Neurological: Positive for weakness. Negative for dizziness and headaches.       Past Medical History:  Diagnosis Date  . Allergic rhinitis   . Benign essential tremor    toprol XL and remeron  . Breathing difficulty 2011   on O2 since 10/2009  . CAD (coronary  artery disease)    severe, s/p 4 cardiac caths, minimally invasive CABG _0 , ICA stent 2004 then LAD stent 2005, MEDICAL MANAGEMENT ONLY, MAY USE REPEATED SL NITRO  . Chronic kidney disease    stage 3 kidney disease.  . CVA (cerebral infarction)    after a stent, minimal R residual weakness, affects speech when tired, ?mild dysequilibrium  . Diabetes mellitus 2003   previously saw Dr. Elyse Hsu  . Diastolic CHF ([place]) 07/7891   grade 1, normal EF, mildly dilated LA, normal PA pressures  . Dry senile macular degeneration    s/p corneal transplants ([facility])  . Fracture of femoral neck, right ([place]) 03/2014   s/p surgery  . Gait instability   . GERD (gastroesophageal reflux disease) 2004  . Hearing loss of both ears 08/2010   audiology eval 08/2010 - mod sensorineural heaing loss, rec trial digital hearing aids and re eval yearly to monitor  . Herpes zoster ophthalmicus 2009   history  . History of chicken pox    has had shingles shot  . History of diverticulitis of colon 1991, 1992, 1994  . History of kidney stones 1952  . Hyperlipidemia   . Hypertension   . Leg weakness, bilateral 2014   s/p neuro eval - thought multifactorial (diabetic neuropathy, lumbar stenosis, and chronic disease deconditioning).  Conservative management recommended  . Lumbar spinal stenosis    with severe spondylosis, s/p laminotomy  . Squamous cell skin cancer, upper arm 03/2015   R upper arm [facility] Massed)  .  Syncope 2005   s/p w/u including CT scan and 24 hour Holter  . Urinary incontinence    stress/urge, per D. Tannenbaum/[place]  . Vitamin D deficiency    on 50k units weekly  . Wedge compression fracture of T10 vertebra ([place]) 05/28/2013   by CXR     Social History   Social History  . Marital status: Widowed    Spouse name: N/A  . Number of children: 2  . Years of education: college   Occupational History  .  Retired   Social History Main Topics  . Smoking status: Never Smoker  . Smokeless  tobacco: Never Used  . Alcohol use No  . Drug use: No  . Sexual activity: Not on file   Other Topics Concern  . Not on file   Social History Narrative   Lives alone at [place] @ [place].  No pets   Has lifeline.   Daughter nearby [facility]) is CHF RN at [employer] nearby [place])    Past Surgical History:  Procedure Laterality Date  . APPENDECTOMY  1948  . BREAST BIOPSY  2001   x2, both benign, last mammo 2011, last pap smear 2011  . CARDIAC CATHETERIZATION  2009   x5  . CARDIOVASCULAR STRESS TEST  2010   nuclear, normal  . CATARACT EXTRACTION     bilateral, corneal dystrophy  . CERVICAL FUSION  2002   anterior decompression and fusion C6/7  . CHOLECYSTECTOMY  1985  . CORNEAL TRANSPLANT     x3  . CORONARY ARTERY BYPASS GRAFT  2004  . DEXA  2005   normal  . HIP FRACTURE SURGERY Right 03/2014   closed reduction percutaneous pinning of R femoral neck fracture ([place])  . LUMBAR SPINE SURGERY  2010   laminotomy (L2/3, 3/4)  . PERCUTANEOUS CORONARY STENT INTERVENTION (PCI-S)  2005   stents x 2 (intermediate and LAD), 2nd complicated by stroke    Family History  Problem Relation Age of Onset  . Cancer Mother     colon  . Heart disease Father   . Cancer Brother     bladder    Allergies  Allergen Reactions  . Actos [Pioglitazone Hydrochloride] Shortness Of Breath    CHF  . Pioglitazone Shortness Of Breath    CHF REACTION: heart failure  . Atenolol Other (See Comments)    lethargy  . Codeine     REACTION: vomiting  . Eszopiclone Other (See Comments)    Nightmares  . Eszopiclone   . Exenatide Nausea Only  . Morphine     REACTION: nausea  . Sulfa Antibiotics   . Sulfacetamide Sodium   . Vicodin [Hydrocodone-Acetaminophen] Nausea And Vomiting  . Exenatide Nausea Only  . Hydrocodone-Acetaminophen Nausea And Vomiting    Current Outpatient Prescriptions on File Prior to Visit  Medication Sig Dispense Refill  . acetaminophen (TYLENOL) 500  MG tablet Take 500 mg by mouth 3 (three) times daily.    . ASPIRIN LOW DOSE 81 MG chewable tablet TAKE 1 TABLET BY MOUTH ONCE DAILY 30 tablet 6  . atorvastatin (LIPITOR) 20 MG tablet TAKE 1 TABLET NIGHTLY 90 tablet 1  . BD AUTOSHIELD DUO 30G X 5 MM MISC Use as instructed with insulin Dx: E11.21 100 each 3  . cephALEXin (KEFLEX) 500 MG capsule Take 1 capsule (500 mg total) by mouth 2 (two) times daily. 14 capsule 0  . cetirizine (ZYRTEC) 10 MG tablet Take 1 tablet (10 mg total)  by mouth daily as needed for allergies.    Marland Kitchen clopidogrel (PLAVIX) 75 MG tablet TAKE 1 TABLET DAILY 90 tablet 3  . docusate sodium (COLACE) 100 MG capsule Take 1 capsule (100 mg total) by mouth daily. 90 capsule 1  . glucose blood (FREESTYLE LITE) test strip Use to check sugar three times daily Dx: 250.40 100 each 3  . glucose monitoring kit (FREESTYLE) monitoring kit 1 each by Does not apply route as needed for other. 1 each 0  . isosorbide mononitrate (IMDUR) 30 MG 24 hr tablet TAKE 1 TABLET AT BEDTIME 90 tablet 3  . Lancets (FREESTYLE) lancets USE TO CHECK SUGAR THREE TIMES A DAY 300 each 2  . LANTUS SOLOSTAR 100 UNIT/ML Solostar Pen INJECT 10 UNITS UNDER THE SKIN DAILY WITH BREAKFAST (DIRECTION CHANGE) 15 mL 6  . LORazepam (ATIVAN) 1 MG tablet Take 1 tablet (1 mg total) by mouth at bedtime. insomnia 90 tablet 1  . meclizine (ANTIVERT) 25 MG tablet Take 0.5-1 tablets (12.5-25 mg total) by mouth daily as needed for nausea.    . metFORMIN (GLUCOPHAGE) 500 MG tablet TAKE 1 TABLET TWICE A DAY WITH MEALS (TEMPORARY COURSE WHILE EXTENDED RELEASE FORMULATION OUT OF STOCK) 60 tablet 1  . metFORMIN (GLUCOPHAGE-XR) 500 MG 24 hr tablet Take 2 tablets (1,000 mg total) by mouth at bedtime. 180 tablet 1  . metoprolol tartrate (LOPRESSOR) 25 MG tablet TAKE 1 TABLET TWICE A DAY 180 tablet 3  . Multiple Vitamin (MULTIVITAMIN) capsule Take 1 capsule by mouth daily.      . nitroGLYCERIN (NITROSTAT) 0.4 MG SL tablet DISSOLVE 1 TABLET UNDER  THE TONGUE AS NEEDED 30 tablet 3  . prednisoLONE sodium phosphate (INFLAMASE FORTE) 1 % ophthalmic solution Place 1 drop into both eyes daily. 5 mL 3  . RABEprazole (ACIPHEX) 20 MG tablet TAKE 1 TABLET DAILY 90 tablet 1  . sertraline (ZOLOFT) 50 MG tablet Take 1 tablet (50 mg total) by mouth daily. 90 tablet 3  . SPIRIVA HANDIHALER 18 MCG inhalation capsule PLACE 1 CAPSULE INTO INHALER AND INHALE THE CONTENTS OF 1 CAPSULE DAILY 90 capsule 2  . torsemide (DEMADEX) 20 MG tablet Take 2 tablets every morning and 1 extra tablet on Monday, Wednesday and Friday if needed. 225 tablet 1  . traMADol (ULTRAM) 50 MG tablet TAKE ONE TABLET BY MOUTH 2 TIMES A DAY AT 3PM AND 10PM 60 tablet 0  . traZODone (DESYREL) 50 MG tablet TAKE ONE-HALF (1/2) TO 1 TABLET AT BEDTIME AS NEEDED FOR SLEEP 90 tablet 1  . Vitamin D, Ergocalciferol, (DRISDOL) 50000 units CAPS capsule TAKE 1 CAPSULE ONCE EVERY WEEK 12 capsule 1   No current facility-administered medications on file prior to visit.     BP 118/70   Pulse 76   Temp 98 F (36.7 C) (Oral)   Ht _0  (1.626 m)   Wt 125 lb 12.8 oz (57.1 kg)   SpO2 97%   BMI 21.59 kg/m    Objective:   Physical Exam  Constitutional: She is oriented to person, place, and time. She appears well-nourished.  Neck: Neck supple.  Cardiovascular: Normal rate and regular rhythm.   Pulmonary/Chest: Effort normal. She has no decreased breath sounds. She has no wheezes. She has rhonchi. She has no rales.  Neurological: She is alert and oriented to person, place, and time.  Skin: Skin is warm and dry.  3-4 well healing skin tears to right forearm. No s/s of acute infection.  Assessment & Plan:  Emergency Department Follow Up:  Presented to [facility] s/p fall. Found to have UTI and early pneumonia. Also with decreased renal function. Overall continues to feel weak, explained today that she'll likely feel weak for a short duration longer as she's overcoming two  infections. Exam today stable. She's alert and oriented and participating in her HPI. Repeat BMP today given electrolyte imbalances and decreased renal function last week.  Continue Keflex. Order written for non-stick adhesive for dressing changes to be done daily. Follow up with Dr. Danise Mina as scheduled.  All hospital labs, imaging, and notes reviewed.  Sheral Flow, NP

## 2016-02-23 NOTE — Patient Instructions (Signed)
Complete lab work prior to leaving today. I will notify you of your results once received.   Ensure they are using non-stick dressings to the right arm. It's healing well. Please notify us if you notice redness, oozing of blood or other colored drainage, fevers, confusion.  Ensure you are staying hydrated with water to prevent dehydration.  Use your walker everyday. Make sure you get your balance before you start walking.  It was a pleasure meeting you!

## 2016-02-23 NOTE — Progress Notes (Signed)
Pre visit review using our clinic review tool, if applicable. No additional management support is needed unless otherwise documented below in the visit note. 

## 2016-02-26 ENCOUNTER — Ambulatory Visit: Payer: Medicare Other | Admitting: Family Medicine

## 2016-03-01 ENCOUNTER — Other Ambulatory Visit: Payer: Self-pay

## 2016-03-01 NOTE — Telephone Encounter (Signed)
Linda at [facility] left /vm;the ativan 1 mg pt takes at hs is going to take 2 weeks to get in and [place] request hard copy of ativan 1 mg sent to [facility] to get at their pharmacy.Please advise.

## 2016-03-02 ENCOUNTER — Encounter: Payer: Self-pay | Admitting: Family Medicine

## 2016-03-02 ENCOUNTER — Telehealth: Payer: Self-pay

## 2016-03-02 ENCOUNTER — Ambulatory Visit (INDEPENDENT_AMBULATORY_CARE_PROVIDER_SITE_OTHER): Payer: Medicare Other | Admitting: Family Medicine

## 2016-03-02 VITALS — BP 116/78 | HR 68 | Temp 97.7°F | Wt 123.5 lb

## 2016-03-02 DIAGNOSIS — E1121 Type 2 diabetes mellitus with diabetic nephropathy: Secondary | ICD-10-CM | POA: Diagnosis not present

## 2016-03-02 DIAGNOSIS — I25708 Atherosclerosis of coronary artery bypass graft(s), unspecified, with other forms of angina pectoris: Secondary | ICD-10-CM | POA: Diagnosis not present

## 2016-03-02 DIAGNOSIS — S50819A Abrasion of unspecified forearm, initial encounter: Secondary | ICD-10-CM | POA: Insufficient documentation

## 2016-03-02 DIAGNOSIS — R531 Weakness: Secondary | ICD-10-CM

## 2016-03-02 DIAGNOSIS — R110 Nausea: Secondary | ICD-10-CM | POA: Diagnosis not present

## 2016-03-02 DIAGNOSIS — J9611 Chronic respiratory failure with hypoxia: Secondary | ICD-10-CM

## 2016-03-02 DIAGNOSIS — F331 Major depressive disorder, recurrent, moderate: Secondary | ICD-10-CM

## 2016-03-02 MED ORDER — LORAZEPAM 1 MG PO TABS: 1.0000 mg | ORAL_TABLET | Freq: Every day | ORAL | 1 refills | Status: DC

## 2016-03-02 MED ORDER — ONDANSETRON HCL 4 MG PO TABS: 4.0000 mg | ORAL_TABLET | Freq: Two times a day (BID) | ORAL | 1 refills | Status: AC | PRN

## 2016-03-02 MED ORDER — METFORMIN HCL ER 500 MG PO TB24: 500.0000 mg | ORAL_TABLET | Freq: Every day | ORAL | 3 refills | Status: DC

## 2016-03-02 NOTE — Telephone Encounter (Signed)
Rx faxed to [facility].

## 2016-03-02 NOTE — Assessment & Plan Note (Signed)
Continue sertraline. Pt reports she has been out of lorazepam for the last 3 days - ran out. Discussed dangers of suddenly stopping benzodiazepam, I talked with [place] at [facility] about this. [facility] will [facility] and printed 30d supply for patient to take to [facility] today to get her medication.

## 2016-03-02 NOTE — Assessment & Plan Note (Addendum)
Marked deterioration over last few weeks. ?metformin related - will continue slow taper off metformin, continue lantus 10u daily.  Prescribed PRN zofran for nausea.  RTC 3wks f/u visit

## 2016-03-02 NOTE — Assessment & Plan Note (Addendum)
Severe, medically managed.

## 2016-03-02 NOTE — Assessment & Plan Note (Signed)
Continue homecare

## 2016-03-02 NOTE — Telephone Encounter (Signed)
Called ,left message at # provided.

## 2016-03-02 NOTE — Assessment & Plan Note (Addendum)
Progressively worse per patient report. Discussed looser control is adequate. Will decrease metformin to see if any improvement in nausea.

## 2016-03-02 NOTE — Assessment & Plan Note (Signed)
Worsening. ?progression of chronic illness. Discussed with patient.

## 2016-03-02 NOTE — Progress Notes (Signed)
Pre visit review using our clinic review tool, if applicable. No additional management support is needed unless otherwise documented below in the visit note. 

## 2016-03-02 NOTE — Progress Notes (Signed)
BP 116/78   Pulse 68   Temp 97.7 F (36.5 C) (Oral)   Wt 123 lb 8 oz (56 kg)   BMI 21.20 kg/m  on 3L [place]  CC: 43mof/u visit Subjective:    Patient ID: Christine Blanchard female    DOB: [birth year] 83y.o.   MRN: [id number] HPI: Christine BOHANis a 83y.o. female presenting on 03/02/2016 for Follow-up (nauseated every AM; weak)   [facility] Notes reviewed. For possible UTI - treated with 1L NS and rocephin, then oral azithromycin and keflex. Some trouble controlling skin tears - treated with avitene. Kidney function improved on recheck. UCx returned klebsiella, sensitive to keflex.   Ongoing severe am nausea, unable to eat breakfast. Ongoing for 2 weeks. Does not get meclizine until 3pm. Progressive weakness. Ongoing weight loss. Denies fevers/chills. Chronic dyspnea and ongoing chest pain - intermittent nitro use. No vomiting or diarrhea.   Ran out of lorazepam last week - hasn't received any since then. Told we never refilled, but we didn't receive request until yesterday PM.  Relevant past medical, surgical, family and social history reviewed and updated as indicated. Interim medical history since our last visit reviewed. Allergies and medications reviewed and updated. Current Outpatient Prescriptions on File Prior to Visit  Medication Sig  . acetaminophen (TYLENOL) 500 MG tablet Take 500 mg by mouth 3 (three) times daily.  . ASPIRIN LOW DOSE 81 MG chewable tablet TAKE 1 TABLET BY MOUTH ONCE DAILY  . atorvastatin (LIPITOR) 20 MG tablet TAKE 1 TABLET NIGHTLY  . BD AUTOSHIELD DUO 30G X 5 MM MISC Use as instructed with insulin Dx: E11.21  . cetirizine (ZYRTEC) 10 MG tablet Take 1 tablet (10 mg total) by mouth daily as needed for allergies.  .Marland Kitchenclopidogrel (PLAVIX) 75 MG tablet TAKE 1 TABLET DAILY  . docusate sodium (COLACE) 100 MG capsule Take 1 capsule (100 mg total) by mouth daily.  .Marland Kitchenglucose blood (FREESTYLE LITE) test strip Use to  check sugar three times daily Dx: 250.40  . glucose monitoring kit (FREESTYLE) monitoring kit 1 each by Does not apply route as needed for other.  . isosorbide mononitrate (IMDUR) 30 MG 24 hr tablet TAKE 1 TABLET AT BEDTIME  . KLOR-CON M10 10 MEQ tablet TAKE 2 TABLETS (20 MEQ TOTAL) DAILY  . Lancets (FREESTYLE) lancets USE TO CHECK SUGAR THREE TIMES A DAY  . LANTUS SOLOSTAR 100 UNIT/ML Solostar Pen INJECT 10 UNITS UNDER THE SKIN DAILY WITH BREAKFAST (DIRECTION CHANGE)  . meclizine (ANTIVERT) 25 MG tablet Take 0.5-1 tablets (12.5-25 mg total) by mouth daily as needed for nausea.  . metoprolol tartrate (LOPRESSOR) 25 MG tablet TAKE 1 TABLET TWICE A DAY  . Multiple Vitamin (MULTIVITAMIN) capsule Take 1 capsule by mouth daily.    . nitroGLYCERIN (NITROSTAT) 0.4 MG SL tablet DISSOLVE 1 TABLET UNDER THE TONGUE AS NEEDED  . prednisoLONE sodium phosphate (INFLAMASE FORTE) 1 % ophthalmic solution Place 1 drop into both eyes daily.  . RABEprazole (ACIPHEX) 20 MG tablet TAKE 1 TABLET DAILY  . sertraline (ZOLOFT) 50 MG tablet Take 1 tablet (50 mg total) by mouth daily.  .Marland KitchenSPIRIVA HANDIHALER 18 MCG inhalation capsule PLACE 1 CAPSULE INTO INHALER AND INHALE THE CONTENTS OF 1 CAPSULE DAILY  . torsemide (DEMADEX) 20 MG tablet Take 2 tablets every morning and 1 extra tablet on Monday, Wednesday and Friday if needed.  . traMADol (ULTRAM) 50 MG  tablet TAKE ONE TABLET BY MOUTH 2 TIMES A DAY AT 3PM AND 10PM  . traZODone (DESYREL) 50 MG tablet TAKE ONE-HALF (1/2) TO 1 TABLET AT BEDTIME AS NEEDED FOR SLEEP  . Vitamin D, Ergocalciferol, (DRISDOL) 50000 units CAPS capsule TAKE 1 CAPSULE ONCE EVERY WEEK  . LORazepam (ATIVAN) 1 MG tablet Take 1 tablet (1 mg total) by mouth at bedtime. insomnia   No current facility-administered medications on file prior to visit.     Review of Systems Per HPI unless specifically indicated in ROS section     Objective:    BP 116/78   Pulse 68   Temp 97.7 F (36.5 C) (Oral)   Wt  123 lb 8 oz (56 kg)   BMI 21.20 kg/m   Wt Readings from Last 3 Encounters:  03/02/16 123 lb 8 oz (56 kg)  02/23/16 125 lb 12.8 oz (57.1 kg)  01/20/16 139 lb (63 kg)    Physical Exam  Constitutional: She appears well-developed and well-nourished. No distress.  Weak, fatigued, walks with rollater  HENT:  Mouth/Throat: Oropharynx is clear and moist. No oropharyngeal exudate.  Cardiovascular: Normal rate, regular rhythm, normal heart sounds and intact distal pulses.   No murmur heard. Pulmonary/Chest: Effort normal and breath sounds normal. No respiratory distress. She has no wheezes. She has no rales.  Coarse crackles bibasilarly  Musculoskeletal: She exhibits no edema.  Skin: Skin is warm and dry. No rash noted.  R forearm with healing abrasions and scabs, no erythema/induration  Psychiatric: She has a normal mood and affect.  Nursing note and vitals reviewed.  Results for orders placed or performed in visit on 54/62/70  Basic metabolic panel  Result Value Ref Range   Sodium 139 135 - 145 mEq/L   Potassium 3.6 3.5 - 5.1 mEq/L   Chloride 95 (L) 96 - 112 mEq/L   CO2 36 (H) 19 - 32 mEq/L   Glucose, Bld 127 (H) 70 - 99 mg/dL   BUN 15 6 - 23 mg/dL   Creatinine, Ser 0.75 0.40 - 1.20 mg/dL   Calcium 9.9 8.4 - 10.5 mg/dL   GFR 77.92 >60.00 mL/min   Lab Results  Component Value Date   HGBA1C 7.4 (H) 10/08/2015       Assessment & Plan:   Problem List Items Addressed This Visit    Abrasion of forearm without infection    Continue home care.       CAD (coronary artery disease) of artery bypass graft    Severe, medically managed.        Chronic hypoxemic respiratory failure (HCC)    Continue oxygen.       General weakness    Worsening. ?progression of chronic illness. Discussed with patient.       MDD (major depressive disorder), recurrent episode, moderate (HCC)    Continue sertraline. Pt reports she has been out of lorazepam for the last 3 days - ran out. Discussed  dangers of suddenly stopping benzodiazepam, I talked with [place] at [facility] about this. We will [facility]d supply [facility] and printed 30d supply for patient to take to [facility] today to get her medication.       Nausea - Primary    Marked deterioration over last few weeks. ?metformin related - will continue slow taper off metformin, continue lantus 10u daily.  Prescribed PRN zofran for nausea.  RTC 3wks f/u visit      Well controlled type 2 diabetes mellitus with nephropathy ([place])  Progressively worse per patient report. Discussed looser control is adequate. Will decrease metformin to see if any improvement in nausea.       Relevant Medications   metFORMIN (GLUCOPHAGE-XR) 500 MG 24 hr tablet       Follow up plan: Return in about 3 weeks (around 03/23/2016), or if symptoms worsen or fail to improve, for follow up visit.  Ria Bush, MD

## 2016-03-02 NOTE — Telephone Encounter (Signed)
Printed 90d supply to [facility] Printed 30d supply to take locally.

## 2016-03-02 NOTE — Patient Instructions (Addendum)
Decrease metformin to once daily to see if nausea improves.  May take zofran twice daily as needed for nausea.  I will touch base with [name] supervisor about your lorazepam - temporary supply printed out today. You should not suddenly stop this medication.  Return as needed or in 3 weeks for follow up visit. We will check labs at that time.

## 2016-03-02 NOTE — Telephone Encounter (Signed)
V/M left from [facility] requesting cb about problem with lorazepam for pt so will not have problems in the future. Per todays note Dr Darnell Level was going to Educational psychologist at [facility].

## 2016-03-02 NOTE — Assessment & Plan Note (Signed)
Continue oxygen. 

## 2016-03-04 NOTE — Telephone Encounter (Signed)
Called, voicemail again.

## 2016-03-05 NOTE — Telephone Encounter (Signed)
Called, straight to voicemail. Left message.

## 2016-03-09 NOTE — Telephone Encounter (Signed)
5th time I call without being able to talk to anyone. Left another message, asking to call us back.

## 2016-03-09 NOTE — Telephone Encounter (Signed)
Spoke with Georgiann Cocker at [facility]. They will contact [facility] who will contact us when refills needed.

## 2016-03-23 ENCOUNTER — Ambulatory Visit (INDEPENDENT_AMBULATORY_CARE_PROVIDER_SITE_OTHER): Payer: Medicare Other | Admitting: Family Medicine

## 2016-03-23 ENCOUNTER — Encounter: Payer: Self-pay | Admitting: Family Medicine

## 2016-03-23 VITALS — BP 108/64 | HR 74 | Temp 98.3°F | Wt 115.1 lb

## 2016-03-23 DIAGNOSIS — E1121 Type 2 diabetes mellitus with diabetic nephropathy: Secondary | ICD-10-CM | POA: Diagnosis not present

## 2016-03-23 DIAGNOSIS — R110 Nausea: Secondary | ICD-10-CM | POA: Diagnosis not present

## 2016-03-23 DIAGNOSIS — R531 Weakness: Secondary | ICD-10-CM | POA: Diagnosis not present

## 2016-03-23 DIAGNOSIS — I5032 Chronic diastolic (congestive) heart failure: Secondary | ICD-10-CM | POA: Diagnosis not present

## 2016-03-23 DIAGNOSIS — I25708 Atherosclerosis of coronary artery bypass graft(s), unspecified, with other forms of angina pectoris: Secondary | ICD-10-CM

## 2016-03-23 DIAGNOSIS — S50819A Abrasion of unspecified forearm, initial encounter: Secondary | ICD-10-CM

## 2016-03-23 DIAGNOSIS — J9611 Chronic respiratory failure with hypoxia: Secondary | ICD-10-CM

## 2016-03-23 LAB — HEMOGLOBIN A1C: Hgb A1c MFr Bld: 7.2 % — ABNORMAL HIGH (ref 4.6–6.5)

## 2016-03-23 LAB — BASIC METABOLIC PANEL
BUN: 10 mg/dL (ref 6–23)
CALCIUM: 9.4 mg/dL (ref 8.4–10.5)
CO2: 39 meq/L — AB (ref 19–32)
Chloride: 96 mEq/L (ref 96–112)
Creatinine, Ser: 0.74 mg/dL (ref 0.40–1.20)
GFR: 79.12 mL/min (ref >60.00)
Glucose, Bld: 143 mg/dL — ABNORMAL HIGH (ref 70–99)
Potassium: 3.5 mEq/L (ref 3.5–5.1)
SODIUM: 140 meq/L (ref 135–145)

## 2016-03-23 MED ORDER — GLUCOSE BLOOD VI STRP: ORAL_STRIP | 3 refills | Status: AC

## 2016-03-23 NOTE — Progress Notes (Signed)
Pre visit review using our clinic review tool, if applicable. No additional management support is needed unless otherwise documented below in the visit note. 

## 2016-03-23 NOTE — Progress Notes (Signed)
BP 108/64 (BP Location: Left Arm, Patient Position: Sitting, Cuff Size: Normal)   Pulse 74   Temp 98.3 F (36.8 C) (Oral)   Wt 115 lb 1.9 oz (52.2 kg)   SpO2 95% Comment: 2L [place]  BMI 19.76 kg/m    CC: 3wk f/u visit Subjective:    Patient ID: Christine Blanchard, female    DOB: [birth year], 83 y.o.   MRN: [id number]  HPI: Christine Blanchard is a 83 y.o. female presenting on 03/23/2016 for Wound Check   See prior note for details.  Forearm abrasion has healed wonderfully - ok to stop wound care to area.   Progressive nausea - ?medication related (metformin). We have slowly been tapering med to see if any improvement - she has noted improvement with this decrease to metformin 510m XR once daily.   Ongoing weight loss noted.Trouble eating due to shaking. She still has no appetite.  She does try to drink diabetic nutritional supplement boost when she doesn't eat a full meal.  Ongoing dyspnea with mildly productive cough.   Predominant concern is persistent weakness.   Relevant past medical, surgical, family and social history reviewed and updated as indicated. Interim medical history since our last visit reviewed. Allergies and medications reviewed and updated. Outpatient Medications Prior to Visit  Medication Sig Dispense Refill  . acetaminophen (TYLENOL) 500 MG tablet Take 500 mg by mouth 3 (three) times daily.    . ASPIRIN LOW DOSE 81 MG chewable tablet TAKE 1 TABLET BY MOUTH ONCE DAILY 30 tablet 6  . atorvastatin (LIPITOR) 20 MG tablet TAKE 1 TABLET NIGHTLY 90 tablet 1  . BD AUTOSHIELD DUO 30G X 5 MM MISC Use as instructed with insulin Dx: E11.21 100 each 3  . cetirizine (ZYRTEC) 10 MG tablet Take 1 tablet (10 mg total) by mouth daily as needed for allergies.    .Marland Kitchenclopidogrel (PLAVIX) 75 MG tablet TAKE 1 TABLET DAILY 90 tablet 3  . docusate sodium (COLACE) 100 MG capsule Take 1 capsule (100 mg total) by mouth daily. 90 capsule 1  . glucose monitoring kit (FREESTYLE) monitoring kit 1  each by Does not apply route as needed for other. 1 each 0  . isosorbide mononitrate (IMDUR) 30 MG 24 hr tablet TAKE 1 TABLET AT BEDTIME 90 tablet 3  . KLOR-CON M10 10 MEQ tablet TAKE 2 TABLETS (20 MEQ TOTAL) DAILY 180 tablet 1  . Lancets (FREESTYLE) lancets USE TO CHECK SUGAR THREE TIMES A DAY 300 each 2  . LANTUS SOLOSTAR 100 UNIT/ML Solostar Pen INJECT 10 UNITS UNDER THE SKIN DAILY WITH BREAKFAST (DIRECTION CHANGE) 15 mL 6  . LORazepam (ATIVAN) 1 MG tablet Take 1 tablet (1 mg total) by mouth at bedtime. insomnia 90 tablet 1  . metFORMIN (GLUCOPHAGE-XR) 500 MG 24 hr tablet Take 1 tablet (500 mg total) by mouth at bedtime. 90 tablet 3  . metoprolol tartrate (LOPRESSOR) 25 MG tablet TAKE 1 TABLET TWICE A DAY 180 tablet 3  . Multiple Vitamin (MULTIVITAMIN) capsule Take 1 capsule by mouth daily.      . nitroGLYCERIN (NITROSTAT) 0.4 MG SL tablet DISSOLVE 1 TABLET UNDER THE TONGUE AS NEEDED 30 tablet 3  . ondansetron (ZOFRAN) 4 MG tablet Take 1 tablet (4 mg total) by mouth 2 (two) times daily as needed for nausea or vomiting. 30 tablet 1  . prednisoLONE sodium phosphate (INFLAMASE FORTE) 1 % ophthalmic solution Place 1 drop into both eyes daily. 5 mL 3  . RABEprazole (ACIPHEX) 20  MG tablet TAKE 1 TABLET DAILY 90 tablet 1  . sertraline (ZOLOFT) 50 MG tablet Take 1 tablet (50 mg total) by mouth daily. 90 tablet 3  . SPIRIVA HANDIHALER 18 MCG inhalation capsule PLACE 1 CAPSULE INTO INHALER AND INHALE THE CONTENTS OF 1 CAPSULE DAILY 90 capsule 2  . torsemide (DEMADEX) 20 MG tablet Take 2 tablets every morning and 1 extra tablet on Monday, Wednesday and Friday if needed. 225 tablet 1  . traMADol (ULTRAM) 50 MG tablet TAKE ONE TABLET BY MOUTH 2 TIMES A DAY AT 3PM AND 10PM 60 tablet 0  . traZODone (DESYREL) 50 MG tablet TAKE ONE-HALF (1/2) TO 1 TABLET AT BEDTIME AS NEEDED FOR SLEEP 90 tablet 1  . Vitamin D, Ergocalciferol, (DRISDOL) 50000 units CAPS capsule TAKE 1 CAPSULE ONCE EVERY WEEK 12 capsule 1  .  glucose blood (FREESTYLE LITE) test strip Use to check sugar three times daily Dx: 250.40 100 each 3  . meclizine (ANTIVERT) 25 MG tablet Take 0.5-1 tablets (12.5-25 mg total) by mouth daily as needed for nausea.     No facility-administered medications prior to visit.      Per HPI unless specifically indicated in ROS section below Review of Systems     Objective:    BP 108/64 (BP Location: Left Arm, Patient Position: Sitting, Cuff Size: Normal)   Pulse 74   Temp 98.3 F (36.8 C) (Oral)   Wt 115 lb 1.9 oz (52.2 kg)   SpO2 95% Comment: 2L [place]  BMI 19.76 kg/m   Wt Readings from Last 3 Encounters:  03/23/16 115 lb 1.9 oz (52.2 kg)  03/02/16 123 lb 8 oz (56 kg)  02/23/16 125 lb 12.8 oz (57.1 kg)    Physical Exam  Constitutional: She appears well-developed and well-nourished. No distress.  Evident thinning Uses walker  HENT:  Mouth/Throat: Oropharynx is clear and moist. No oropharyngeal exudate.  Cardiovascular: Normal rate, regular rhythm, normal heart sounds and intact distal pulses.   No murmur heard. Pulmonary/Chest: Effort normal and breath sounds normal. No respiratory distress. She has no wheezes. She has no rales.  Coarse sounds throughout  Musculoskeletal: She exhibits edema (tr).  Skin: Skin is warm and dry. No rash noted.  R forearm wound fully healed, scar present  Psychiatric: She has a normal mood and affect.  Nursing note and vitals reviewed.  Results for orders placed or performed in visit on 03/23/16  Hemoglobin A1c  Result Value Ref Range   Hgb A1c MFr Bld 7.2 (H) 4.6 - 6.5 %  Basic metabolic panel  Result Value Ref Range   Sodium 140 135 - 145 mEq/L   Potassium 3.5 3.5 - 5.1 mEq/L   Chloride 96 96 - 112 mEq/L   CO2 39 (H) 19 - 32 mEq/L   Glucose, Bld 143 (H) 70 - 99 mg/dL   BUN 10 6 - 23 mg/dL   Creatinine, Ser 0.74 0.40 - 1.20 mg/dL   Calcium 9.4 8.4 - 10.5 mg/dL   GFR 79.12 >60.00 mL/min      Assessment & Plan:  Over 25 minutes were spent  face-to-face with the patient during this encounter and >50% of that time was spent on counseling and coordination of care  Problem List Items Addressed This Visit    RESOLVED: Abrasion of forearm without infection    This has healed.      CAD (coronary artery disease) of artery bypass graft    Severe, inoperable. Continue medical management.  Relevant Orders   Amb Referral to Palliative Care   Chronic diastolic CHF (congestive heart failure) (HCC)    Compliant with 40m torsemide daily, rare use of extra dose. With weight loss noted, concern for over-treatment however mild pedal edema noted on exam today. Continue current dose, check Cr and K today.       Relevant Orders   Amb Referral to Palliative Care   Chronic hypoxemic respiratory failure (HCC)    Continue continuous 2L by [place] supplemental oxygen.       Relevant Orders   Amb Referral to Palliative Care   General weakness    Progressive weakness noted. Check BMP today.  ?progression of chronic illness. Reviewed with patient. Discussed palliative care vs hospice. Suggested palliative care evaluation - pt interested in this. [facility]  Encouraged she discuss progressive deterioration and palliative care evaluation with children.       Relevant Orders   Amb Referral to Palliative Care   Nausea    This has significantly improved with lower metformin dose - continue this.       Well controlled type 2 diabetes mellitus with nephropathy ([facility] - Primary    Update A1c on lower metformin dose.       Relevant Orders   Hemoglobin A1c (Completed)   Basic metabolic panel (Completed)       Follow up plan: Return in about 4 weeks (around 04/20/2016) for follow up visit.  JRia Bush MD

## 2016-03-23 NOTE — Patient Instructions (Addendum)
Ok to stop wound care to forearm wound as healed.  I'm glad nausea is better but I want [place] to continue with boost or ensure supplements as able.  Labs today. Continue current medicines.

## 2016-03-24 ENCOUNTER — Telehealth: Payer: Self-pay | Admitting: Family Medicine

## 2016-03-24 MED ORDER — MECLIZINE HCL 12.5 MG PO TABS: ORAL_TABLET | ORAL | 0 refills | Status: DC

## 2016-03-24 NOTE — Assessment & Plan Note (Addendum)
This has significantly improved with lower metformin dose - continue this.

## 2016-03-24 NOTE — Assessment & Plan Note (Signed)
Update A1c on lower metformin dose.

## 2016-03-24 NOTE — Assessment & Plan Note (Signed)
Compliant with 40mg  torsemide daily, rare use of extra dose. With weight loss noted, concern for over-treatment however mild pedal edema noted on exam today. Continue current dose, check Cr and K today.

## 2016-03-24 NOTE — Assessment & Plan Note (Addendum)
Progressive weakness noted. Check BMP today.  ?progression of chronic illness. Reviewed with patient. Discussed palliative care vs hospice. Suggested palliative care evaluation - pt interested in this. Will see if we can set this up at [facility].  Encouraged she discuss progressive deterioration and palliative care evaluation with children.

## 2016-03-24 NOTE — Telephone Encounter (Signed)
After discussing with patient, I've placed a referral to palliative care for Christine Blanchard - can we touch base with Christine Blanchard at [facility] about what options are available there? ie can we do palliative care consult, or do we need to do hospice referral?

## 2016-03-24 NOTE — Assessment & Plan Note (Signed)
This has healed.  

## 2016-03-24 NOTE — Assessment & Plan Note (Signed)
Severe, inoperable. Continue medical management.

## 2016-03-24 NOTE — Assessment & Plan Note (Addendum)
Continue continuous 2L by [place] supplemental oxygen.

## 2016-03-25 NOTE — Telephone Encounter (Signed)
Message left for Linda to return my call.

## 2016-03-29 NOTE — Telephone Encounter (Signed)
Christine Blanchard left v/m requesting cb.

## 2016-03-31 ENCOUNTER — Telehealth: Payer: Self-pay

## 2016-03-31 DIAGNOSIS — I5032 Chronic diastolic (congestive) heart failure: Secondary | ICD-10-CM

## 2016-03-31 DIAGNOSIS — I25708 Atherosclerosis of coronary artery bypass graft(s), unspecified, with other forms of angina pectoris: Secondary | ICD-10-CM

## 2016-03-31 DIAGNOSIS — J9611 Chronic respiratory failure with hypoxia: Secondary | ICD-10-CM

## 2016-03-31 NOTE — Telephone Encounter (Signed)
Debra with [facility] left v/m; Palliative Care NP saw pt today and does think pt is Hospice appropriate and family is in agreement. If Dr Darnell Level agrees please fax Hospice Referral form to fax # [phone].

## 2016-03-31 NOTE — Telephone Encounter (Signed)
We discussed palliative care, didn't specifically discuss hospice, but if pt desires this ok to do. Thanks.

## 2016-04-01 NOTE — Telephone Encounter (Signed)
Spoke with patient's daughter. She was appreciative of the referral and said Hospice will be coming out to do formal intake probably sometime next week. She said they told her she more than qualifies. She just wanted to thank you for everything.

## 2016-04-01 NOTE — Telephone Encounter (Signed)
Hospice form faxed to [place].

## 2016-04-02 DIAGNOSIS — Z7902 Long term (current) use of antithrombotics/antiplatelets: Secondary | ICD-10-CM | POA: Diagnosis not present

## 2016-04-02 DIAGNOSIS — J9611 Chronic respiratory failure with hypoxia: Secondary | ICD-10-CM | POA: Diagnosis not present

## 2016-04-02 DIAGNOSIS — E1122 Type 2 diabetes mellitus with diabetic chronic kidney disease: Secondary | ICD-10-CM | POA: Diagnosis not present

## 2016-04-02 DIAGNOSIS — N183 Chronic kidney disease, stage 3 (moderate): Secondary | ICD-10-CM | POA: Diagnosis not present

## 2016-04-02 DIAGNOSIS — E1121 Type 2 diabetes mellitus with diabetic nephropathy: Secondary | ICD-10-CM | POA: Diagnosis not present

## 2016-04-02 DIAGNOSIS — I130 Hypertensive heart and chronic kidney disease with heart failure and stage 1 through stage 4 chronic kidney disease, or unspecified chronic kidney disease: Secondary | ICD-10-CM | POA: Diagnosis not present

## 2016-04-02 DIAGNOSIS — E785 Hyperlipidemia, unspecified: Secondary | ICD-10-CM | POA: Diagnosis not present

## 2016-04-02 DIAGNOSIS — I25708 Atherosclerosis of coronary artery bypass graft(s), unspecified, with other forms of angina pectoris: Secondary | ICD-10-CM | POA: Diagnosis not present

## 2016-04-02 DIAGNOSIS — Z7984 Long term (current) use of oral hypoglycemic drugs: Secondary | ICD-10-CM | POA: Diagnosis not present

## 2016-04-02 DIAGNOSIS — S90416D Abrasion, unspecified lesser toe(s), subsequent encounter: Secondary | ICD-10-CM | POA: Diagnosis not present

## 2016-04-02 DIAGNOSIS — R531 Weakness: Secondary | ICD-10-CM | POA: Diagnosis not present

## 2016-04-02 DIAGNOSIS — I5032 Chronic diastolic (congestive) heart failure: Secondary | ICD-10-CM | POA: Diagnosis not present

## 2016-04-02 DIAGNOSIS — S61411D Laceration without foreign body of right hand, subsequent encounter: Secondary | ICD-10-CM | POA: Diagnosis not present

## 2016-04-02 DIAGNOSIS — S51811D Laceration without foreign body of right forearm, subsequent encounter: Secondary | ICD-10-CM | POA: Diagnosis not present

## 2016-04-05 DIAGNOSIS — N183 Chronic kidney disease, stage 3 (moderate): Secondary | ICD-10-CM | POA: Diagnosis not present

## 2016-04-05 DIAGNOSIS — E1121 Type 2 diabetes mellitus with diabetic nephropathy: Secondary | ICD-10-CM | POA: Diagnosis not present

## 2016-04-05 DIAGNOSIS — E1122 Type 2 diabetes mellitus with diabetic chronic kidney disease: Secondary | ICD-10-CM | POA: Diagnosis not present

## 2016-04-05 DIAGNOSIS — I5032 Chronic diastolic (congestive) heart failure: Secondary | ICD-10-CM | POA: Diagnosis not present

## 2016-04-05 DIAGNOSIS — I130 Hypertensive heart and chronic kidney disease with heart failure and stage 1 through stage 4 chronic kidney disease, or unspecified chronic kidney disease: Secondary | ICD-10-CM | POA: Diagnosis not present

## 2016-04-05 DIAGNOSIS — J9611 Chronic respiratory failure with hypoxia: Secondary | ICD-10-CM | POA: Diagnosis not present

## 2016-04-06 DIAGNOSIS — E1122 Type 2 diabetes mellitus with diabetic chronic kidney disease: Secondary | ICD-10-CM | POA: Diagnosis not present

## 2016-04-06 DIAGNOSIS — I5032 Chronic diastolic (congestive) heart failure: Secondary | ICD-10-CM | POA: Diagnosis not present

## 2016-04-06 DIAGNOSIS — N183 Chronic kidney disease, stage 3 (moderate): Secondary | ICD-10-CM | POA: Diagnosis not present

## 2016-04-06 DIAGNOSIS — E1121 Type 2 diabetes mellitus with diabetic nephropathy: Secondary | ICD-10-CM | POA: Diagnosis not present

## 2016-04-06 DIAGNOSIS — I130 Hypertensive heart and chronic kidney disease with heart failure and stage 1 through stage 4 chronic kidney disease, or unspecified chronic kidney disease: Secondary | ICD-10-CM | POA: Diagnosis not present

## 2016-04-06 DIAGNOSIS — J9611 Chronic respiratory failure with hypoxia: Secondary | ICD-10-CM | POA: Diagnosis not present

## 2016-04-07 DIAGNOSIS — E1121 Type 2 diabetes mellitus with diabetic nephropathy: Secondary | ICD-10-CM | POA: Diagnosis not present

## 2016-04-07 DIAGNOSIS — I5032 Chronic diastolic (congestive) heart failure: Secondary | ICD-10-CM | POA: Diagnosis not present

## 2016-04-07 DIAGNOSIS — J9611 Chronic respiratory failure with hypoxia: Secondary | ICD-10-CM | POA: Diagnosis not present

## 2016-04-07 DIAGNOSIS — E1122 Type 2 diabetes mellitus with diabetic chronic kidney disease: Secondary | ICD-10-CM | POA: Diagnosis not present

## 2016-04-07 DIAGNOSIS — I130 Hypertensive heart and chronic kidney disease with heart failure and stage 1 through stage 4 chronic kidney disease, or unspecified chronic kidney disease: Secondary | ICD-10-CM | POA: Diagnosis not present

## 2016-04-07 DIAGNOSIS — N183 Chronic kidney disease, stage 3 (moderate): Secondary | ICD-10-CM | POA: Diagnosis not present

## 2016-04-08 DIAGNOSIS — J9611 Chronic respiratory failure with hypoxia: Secondary | ICD-10-CM | POA: Diagnosis not present

## 2016-04-08 DIAGNOSIS — I130 Hypertensive heart and chronic kidney disease with heart failure and stage 1 through stage 4 chronic kidney disease, or unspecified chronic kidney disease: Secondary | ICD-10-CM | POA: Diagnosis not present

## 2016-04-08 DIAGNOSIS — E1121 Type 2 diabetes mellitus with diabetic nephropathy: Secondary | ICD-10-CM | POA: Diagnosis not present

## 2016-04-08 DIAGNOSIS — N183 Chronic kidney disease, stage 3 (moderate): Secondary | ICD-10-CM | POA: Diagnosis not present

## 2016-04-08 DIAGNOSIS — E1122 Type 2 diabetes mellitus with diabetic chronic kidney disease: Secondary | ICD-10-CM | POA: Diagnosis not present

## 2016-04-08 DIAGNOSIS — I5032 Chronic diastolic (congestive) heart failure: Secondary | ICD-10-CM | POA: Diagnosis not present

## 2016-04-09 DIAGNOSIS — E1122 Type 2 diabetes mellitus with diabetic chronic kidney disease: Secondary | ICD-10-CM | POA: Diagnosis not present

## 2016-04-09 DIAGNOSIS — E1121 Type 2 diabetes mellitus with diabetic nephropathy: Secondary | ICD-10-CM | POA: Diagnosis not present

## 2016-04-09 DIAGNOSIS — N183 Chronic kidney disease, stage 3 (moderate): Secondary | ICD-10-CM | POA: Diagnosis not present

## 2016-04-09 DIAGNOSIS — I130 Hypertensive heart and chronic kidney disease with heart failure and stage 1 through stage 4 chronic kidney disease, or unspecified chronic kidney disease: Secondary | ICD-10-CM | POA: Diagnosis not present

## 2016-04-09 DIAGNOSIS — I5032 Chronic diastolic (congestive) heart failure: Secondary | ICD-10-CM | POA: Diagnosis not present

## 2016-04-09 DIAGNOSIS — J9611 Chronic respiratory failure with hypoxia: Secondary | ICD-10-CM | POA: Diagnosis not present

## 2016-04-12 DIAGNOSIS — J9611 Chronic respiratory failure with hypoxia: Secondary | ICD-10-CM | POA: Diagnosis not present

## 2016-04-12 DIAGNOSIS — I5032 Chronic diastolic (congestive) heart failure: Secondary | ICD-10-CM | POA: Diagnosis not present

## 2016-04-12 DIAGNOSIS — E1121 Type 2 diabetes mellitus with diabetic nephropathy: Secondary | ICD-10-CM | POA: Diagnosis not present

## 2016-04-12 DIAGNOSIS — I130 Hypertensive heart and chronic kidney disease with heart failure and stage 1 through stage 4 chronic kidney disease, or unspecified chronic kidney disease: Secondary | ICD-10-CM | POA: Diagnosis not present

## 2016-04-12 DIAGNOSIS — E1122 Type 2 diabetes mellitus with diabetic chronic kidney disease: Secondary | ICD-10-CM | POA: Diagnosis not present

## 2016-04-12 DIAGNOSIS — N183 Chronic kidney disease, stage 3 (moderate): Secondary | ICD-10-CM | POA: Diagnosis not present

## 2016-04-13 DIAGNOSIS — N183 Chronic kidney disease, stage 3 (moderate): Secondary | ICD-10-CM | POA: Diagnosis not present

## 2016-04-13 DIAGNOSIS — E1122 Type 2 diabetes mellitus with diabetic chronic kidney disease: Secondary | ICD-10-CM | POA: Diagnosis not present

## 2016-04-13 DIAGNOSIS — I5032 Chronic diastolic (congestive) heart failure: Secondary | ICD-10-CM | POA: Diagnosis not present

## 2016-04-13 DIAGNOSIS — J9611 Chronic respiratory failure with hypoxia: Secondary | ICD-10-CM | POA: Diagnosis not present

## 2016-04-13 DIAGNOSIS — E1121 Type 2 diabetes mellitus with diabetic nephropathy: Secondary | ICD-10-CM | POA: Diagnosis not present

## 2016-04-13 DIAGNOSIS — I130 Hypertensive heart and chronic kidney disease with heart failure and stage 1 through stage 4 chronic kidney disease, or unspecified chronic kidney disease: Secondary | ICD-10-CM | POA: Diagnosis not present

## 2016-04-14 DIAGNOSIS — I130 Hypertensive heart and chronic kidney disease with heart failure and stage 1 through stage 4 chronic kidney disease, or unspecified chronic kidney disease: Secondary | ICD-10-CM | POA: Diagnosis not present

## 2016-04-14 DIAGNOSIS — E1122 Type 2 diabetes mellitus with diabetic chronic kidney disease: Secondary | ICD-10-CM | POA: Diagnosis not present

## 2016-04-14 DIAGNOSIS — I5032 Chronic diastolic (congestive) heart failure: Secondary | ICD-10-CM | POA: Diagnosis not present

## 2016-04-14 DIAGNOSIS — N183 Chronic kidney disease, stage 3 (moderate): Secondary | ICD-10-CM | POA: Diagnosis not present

## 2016-04-14 DIAGNOSIS — J9611 Chronic respiratory failure with hypoxia: Secondary | ICD-10-CM | POA: Diagnosis not present

## 2016-04-14 DIAGNOSIS — E1121 Type 2 diabetes mellitus with diabetic nephropathy: Secondary | ICD-10-CM | POA: Diagnosis not present

## 2016-04-15 DIAGNOSIS — E1122 Type 2 diabetes mellitus with diabetic chronic kidney disease: Secondary | ICD-10-CM | POA: Diagnosis not present

## 2016-04-15 DIAGNOSIS — J9611 Chronic respiratory failure with hypoxia: Secondary | ICD-10-CM | POA: Diagnosis not present

## 2016-04-15 DIAGNOSIS — I130 Hypertensive heart and chronic kidney disease with heart failure and stage 1 through stage 4 chronic kidney disease, or unspecified chronic kidney disease: Secondary | ICD-10-CM | POA: Diagnosis not present

## 2016-04-15 DIAGNOSIS — N183 Chronic kidney disease, stage 3 (moderate): Secondary | ICD-10-CM | POA: Diagnosis not present

## 2016-04-15 DIAGNOSIS — E1121 Type 2 diabetes mellitus with diabetic nephropathy: Secondary | ICD-10-CM | POA: Diagnosis not present

## 2016-04-15 DIAGNOSIS — I5032 Chronic diastolic (congestive) heart failure: Secondary | ICD-10-CM | POA: Diagnosis not present

## 2016-04-16 DIAGNOSIS — J9611 Chronic respiratory failure with hypoxia: Secondary | ICD-10-CM | POA: Diagnosis not present

## 2016-04-16 DIAGNOSIS — E1121 Type 2 diabetes mellitus with diabetic nephropathy: Secondary | ICD-10-CM | POA: Diagnosis not present

## 2016-04-16 DIAGNOSIS — I5032 Chronic diastolic (congestive) heart failure: Secondary | ICD-10-CM | POA: Diagnosis not present

## 2016-04-16 DIAGNOSIS — I130 Hypertensive heart and chronic kidney disease with heart failure and stage 1 through stage 4 chronic kidney disease, or unspecified chronic kidney disease: Secondary | ICD-10-CM | POA: Diagnosis not present

## 2016-04-16 DIAGNOSIS — N183 Chronic kidney disease, stage 3 (moderate): Secondary | ICD-10-CM | POA: Diagnosis not present

## 2016-04-16 DIAGNOSIS — E1122 Type 2 diabetes mellitus with diabetic chronic kidney disease: Secondary | ICD-10-CM | POA: Diagnosis not present

## 2016-04-19 DIAGNOSIS — E1121 Type 2 diabetes mellitus with diabetic nephropathy: Secondary | ICD-10-CM | POA: Diagnosis not present

## 2016-04-19 DIAGNOSIS — E1122 Type 2 diabetes mellitus with diabetic chronic kidney disease: Secondary | ICD-10-CM | POA: Diagnosis not present

## 2016-04-19 DIAGNOSIS — I5032 Chronic diastolic (congestive) heart failure: Secondary | ICD-10-CM | POA: Diagnosis not present

## 2016-04-19 DIAGNOSIS — N183 Chronic kidney disease, stage 3 (moderate): Secondary | ICD-10-CM | POA: Diagnosis not present

## 2016-04-19 DIAGNOSIS — I130 Hypertensive heart and chronic kidney disease with heart failure and stage 1 through stage 4 chronic kidney disease, or unspecified chronic kidney disease: Secondary | ICD-10-CM | POA: Diagnosis not present

## 2016-04-19 DIAGNOSIS — J9611 Chronic respiratory failure with hypoxia: Secondary | ICD-10-CM | POA: Diagnosis not present

## 2016-04-20 ENCOUNTER — Ambulatory Visit (INDEPENDENT_AMBULATORY_CARE_PROVIDER_SITE_OTHER): Admitting: Family Medicine

## 2016-04-20 ENCOUNTER — Encounter: Payer: Self-pay | Admitting: Family Medicine

## 2016-04-20 VITALS — BP 116/60 | HR 72 | Temp 97.7°F | Wt 130.5 lb

## 2016-04-20 DIAGNOSIS — Z515 Encounter for palliative care: Secondary | ICD-10-CM

## 2016-04-20 DIAGNOSIS — I5032 Chronic diastolic (congestive) heart failure: Secondary | ICD-10-CM | POA: Diagnosis not present

## 2016-04-20 DIAGNOSIS — I25708 Atherosclerosis of coronary artery bypass graft(s), unspecified, with other forms of angina pectoris: Secondary | ICD-10-CM | POA: Diagnosis not present

## 2016-04-20 DIAGNOSIS — Z66 Do not resuscitate: Secondary | ICD-10-CM | POA: Diagnosis not present

## 2016-04-20 DIAGNOSIS — N183 Chronic kidney disease, stage 3 (moderate): Secondary | ICD-10-CM | POA: Diagnosis not present

## 2016-04-20 DIAGNOSIS — I130 Hypertensive heart and chronic kidney disease with heart failure and stage 1 through stage 4 chronic kidney disease, or unspecified chronic kidney disease: Secondary | ICD-10-CM | POA: Diagnosis not present

## 2016-04-20 DIAGNOSIS — J9611 Chronic respiratory failure with hypoxia: Secondary | ICD-10-CM

## 2016-04-20 DIAGNOSIS — E1121 Type 2 diabetes mellitus with diabetic nephropathy: Secondary | ICD-10-CM | POA: Diagnosis not present

## 2016-04-20 DIAGNOSIS — F331 Major depressive disorder, recurrent, moderate: Secondary | ICD-10-CM

## 2016-04-20 DIAGNOSIS — E1122 Type 2 diabetes mellitus with diabetic chronic kidney disease: Secondary | ICD-10-CM | POA: Diagnosis not present

## 2016-04-20 NOTE — Progress Notes (Signed)
Pre visit review using our clinic review tool, if applicable. No additional management support is needed unless otherwise documented below in the visit note. 

## 2016-04-20 NOTE — Progress Notes (Signed)
BP 116/60   Pulse 72   Temp 97.7 F (36.5 C) (Oral)   Wt 130 lb 8 oz (59.2 kg)   SpO2 96% Comment: 2L[place]  BMI 22.40 kg/m    CC: 1 mo f/u visit Subjective:    Patient ID: Christine Blanchard, female    DOB: 1928-06-26, 83 y.o.   MRN: [id number]  HPI: Christine Blanchard is a 83 y.o. female presenting on 04/20/2016 for Follow-up   See prior note for details. Here with daughter [name]. Last visit we referred to palliative care. She has established with hospice and palliative care at [facility] - receiving assistance through them. Overall doing well. Appreciates increased assistance provided.   15lb weight gain noted, along with worsening pedal edema despite torsemide 69m daily. She has not been using her extra torsemide dose.   She has been drinking glucerna daily.  Ongoing general weakness.  Nausea significantly improved with lower metformin dose.   Relevant past medical, surgical, family and social history reviewed and updated as indicated. Interim medical history since our last visit reviewed. Allergies and medications reviewed and updated. Outpatient Medications Prior to Visit  Medication Sig Dispense Refill  . acetaminophen (TYLENOL) 500 MG tablet Take 500 mg by mouth 3 (three) times daily.    . ASPIRIN LOW DOSE 81 MG chewable tablet TAKE 1 TABLET BY MOUTH ONCE DAILY 30 tablet 6  . atorvastatin (LIPITOR) 20 MG tablet TAKE 1 TABLET NIGHTLY 90 tablet 1  . BD AUTOSHIELD DUO 30G X 5 MM MISC Use as instructed with insulin Dx: E11.21 100 each 3  . cetirizine (ZYRTEC) 10 MG tablet Take 1 tablet (10 mg total) by mouth daily as needed for allergies.    .Marland Kitchenclopidogrel (PLAVIX) 75 MG tablet TAKE 1 TABLET DAILY 90 tablet 3  . docusate sodium (COLACE) 100 MG capsule Take 1 capsule (100 mg total) by mouth daily. 90 capsule 1  . glucose blood (FREESTYLE LITE) test strip Use to check sugar three times daily Dx: 250.40 100 each 3  . glucose monitoring kit (FREESTYLE) monitoring kit 1 each by Does not  apply route as needed for other. 1 each 0  . isosorbide mononitrate (IMDUR) 30 MG 24 hr tablet TAKE 1 TABLET AT BEDTIME 90 tablet 3  . KLOR-CON M10 10 MEQ tablet TAKE 2 TABLETS (20 MEQ TOTAL) DAILY 180 tablet 1  . Lancets (FREESTYLE) lancets USE TO CHECK SUGAR THREE TIMES A DAY 300 each 2  . LANTUS SOLOSTAR 100 UNIT/ML Solostar Pen INJECT 10 UNITS UNDER THE SKIN DAILY WITH BREAKFAST (DIRECTION CHANGE) 15 mL 6  . LORazepam (ATIVAN) 1 MG tablet Take 1 tablet (1 mg total) by mouth at bedtime. insomnia 90 tablet 1  . meclizine (ANTIVERT) 12.5 MG tablet One tablet by mouth daily as needed for vertigo 30 tablet 0  . metFORMIN (GLUCOPHAGE-XR) 500 MG 24 hr tablet Take 1 tablet (500 mg total) by mouth at bedtime. 90 tablet 3  . metoprolol tartrate (LOPRESSOR) 25 MG tablet TAKE 1 TABLET TWICE A DAY 180 tablet 3  . Multiple Vitamin (MULTIVITAMIN) capsule Take 1 capsule by mouth daily.      . nitroGLYCERIN (NITROSTAT) 0.4 MG SL tablet DISSOLVE 1 TABLET UNDER THE TONGUE AS NEEDED 30 tablet 3  . ondansetron (ZOFRAN) 4 MG tablet Take 1 tablet (4 mg total) by mouth 2 (two) times daily as needed for nausea or vomiting. 30 tablet 1  . prednisoLONE sodium phosphate (INFLAMASE FORTE) 1 % ophthalmic solution Place 1  drop into both eyes daily. 5 mL 3  . RABEprazole (ACIPHEX) 20 MG tablet TAKE 1 TABLET DAILY 90 tablet 1  . sertraline (ZOLOFT) 50 MG tablet Take 1 tablet (50 mg total) by mouth daily. 90 tablet 3  . SPIRIVA HANDIHALER 18 MCG inhalation capsule PLACE 1 CAPSULE INTO INHALER AND INHALE THE CONTENTS OF 1 CAPSULE DAILY 90 capsule 2  . torsemide (DEMADEX) 20 MG tablet Take 2 tablets every morning and 1 extra tablet on Monday, Wednesday and Friday if needed. 225 tablet 1  . traMADol (ULTRAM) 50 MG tablet TAKE ONE TABLET BY MOUTH 2 TIMES A DAY AT 3PM AND 10PM 60 tablet 0  . traZODone (DESYREL) 50 MG tablet TAKE ONE-HALF (1/2) TO 1 TABLET AT BEDTIME AS NEEDED FOR SLEEP 90 tablet 1  . Vitamin D, Ergocalciferol,  (DRISDOL) 50000 units CAPS capsule TAKE 1 CAPSULE ONCE EVERY WEEK 12 capsule 1   No facility-administered medications prior to visit.      Per HPI unless specifically indicated in ROS section below Review of Systems     Objective:    BP 116/60   Pulse 72   Temp 97.7 F (36.5 C) (Oral)   Wt 130 lb 8 oz (59.2 kg)   SpO2 96% Comment: 2L[place]  BMI 22.40 kg/m   Wt Readings from Last 3 Encounters:  04/20/16 130 lb 8 oz (59.2 kg)  03/23/16 115 lb 1.9 oz (52.2 kg)  03/02/16 123 lb 8 oz (56 kg)    Physical Exam  Constitutional: She appears well-developed and well-nourished. No distress.  HENT:  Mouth/Throat: No oropharyngeal exudate.  Dry MM  Eyes: Conjunctivae are normal. Pupils are equal, round, and reactive to light.  Cardiovascular: Normal rate, regular rhythm, normal heart sounds and intact distal pulses.   No murmur heard. Pulmonary/Chest: Effort normal. No respiratory distress. She has no wheezes. She has rales (bibasilar).  Musculoskeletal: She exhibits edema (1+ bilateral).  Skin: Skin is warm and dry. No rash noted.  Psychiatric: She has a normal mood and affect.  Nursing note and vitals reviewed.  Results for orders placed or performed in visit on 03/23/16  Hemoglobin A1c  Result Value Ref Range   Hgb A1c MFr Bld 7.2 (H) 4.6 - 6.5 %  Basic metabolic panel  Result Value Ref Range   Sodium 140 135 - 145 mEq/L   Potassium 3.5 3.5 - 5.1 mEq/L   Chloride 96 96 - 112 mEq/L   CO2 39 (H) 19 - 32 mEq/L   Glucose, Bld 143 (H) 70 - 99 mg/dL   BUN 10 6 - 23 mg/dL   Creatinine, Ser 0.74 0.40 - 1.20 mg/dL   Calcium 9.4 8.4 - 10.5 mg/dL   GFR 79.12 >60.00 mL/min      Assessment & Plan:   Problem List Items Addressed This Visit    CAD (coronary artery disease) of artery bypass graft    Chronic, severe, inoperable.       Chronic diastolic CHF (congestive heart failure) ([place]) - Primary    Has established with hospice. Takes torsemide 73m daily. With weight gain and  pulmonary rales noted. Will recommend she take extra torsemide 249mdose daily for next 3 days then reassess. Also wrote parameters for torsemide extra dose - 1 tab MWF if weekly weight >5 lb gain noted.       Chronic hypoxemic respiratory failure (HCC)    On continuous 2L by [place]. Has established with hospice.      DNR (do not  resuscitate)   Hospice care patient    Established 04/2016.      MDD (major depressive disorder), recurrent episode, moderate (HCC)    Chronic, stable on sertraline 58m daily. Also takes lorazepam 170mnightly for sleep.       Well controlled type 2 diabetes mellitus with nephropathy ([facility]   A1c stable, nausea improved on lower metformin dose. Continue.           Follow up plan: Return in about 3 months (around 07/21/2016) for follow up visit.  JaRia BushMD

## 2016-04-20 NOTE — Patient Instructions (Addendum)
Continue torsemide 40mg  in the mornings. Take 3rd tablet (20mg  with lunch) for next 3 days.  Then use extra dose MWF for a week (lunch) if weight gain >5 lbs in a week.  Return in 2-3 months for follow up visit.

## 2016-04-21 DIAGNOSIS — E1122 Type 2 diabetes mellitus with diabetic chronic kidney disease: Secondary | ICD-10-CM | POA: Diagnosis not present

## 2016-04-21 DIAGNOSIS — E1121 Type 2 diabetes mellitus with diabetic nephropathy: Secondary | ICD-10-CM | POA: Diagnosis not present

## 2016-04-21 DIAGNOSIS — N183 Chronic kidney disease, stage 3 (moderate): Secondary | ICD-10-CM | POA: Diagnosis not present

## 2016-04-21 DIAGNOSIS — J9611 Chronic respiratory failure with hypoxia: Secondary | ICD-10-CM | POA: Diagnosis not present

## 2016-04-21 DIAGNOSIS — I5032 Chronic diastolic (congestive) heart failure: Secondary | ICD-10-CM | POA: Diagnosis not present

## 2016-04-21 DIAGNOSIS — I130 Hypertensive heart and chronic kidney disease with heart failure and stage 1 through stage 4 chronic kidney disease, or unspecified chronic kidney disease: Secondary | ICD-10-CM | POA: Diagnosis not present

## 2016-04-22 DIAGNOSIS — I5032 Chronic diastolic (congestive) heart failure: Secondary | ICD-10-CM | POA: Diagnosis not present

## 2016-04-22 DIAGNOSIS — E1121 Type 2 diabetes mellitus with diabetic nephropathy: Secondary | ICD-10-CM | POA: Diagnosis not present

## 2016-04-22 DIAGNOSIS — N183 Chronic kidney disease, stage 3 (moderate): Secondary | ICD-10-CM | POA: Diagnosis not present

## 2016-04-22 DIAGNOSIS — E1122 Type 2 diabetes mellitus with diabetic chronic kidney disease: Secondary | ICD-10-CM | POA: Diagnosis not present

## 2016-04-22 DIAGNOSIS — J9611 Chronic respiratory failure with hypoxia: Secondary | ICD-10-CM | POA: Diagnosis not present

## 2016-04-22 DIAGNOSIS — Z515 Encounter for palliative care: Secondary | ICD-10-CM | POA: Insufficient documentation

## 2016-04-22 DIAGNOSIS — I130 Hypertensive heart and chronic kidney disease with heart failure and stage 1 through stage 4 chronic kidney disease, or unspecified chronic kidney disease: Secondary | ICD-10-CM | POA: Diagnosis not present

## 2016-04-22 NOTE — Assessment & Plan Note (Signed)
Chronic, severe, inoperable.

## 2016-04-22 NOTE — Assessment & Plan Note (Signed)
A1c stable, nausea improved on lower metformin dose. Continue.

## 2016-04-22 NOTE — Assessment & Plan Note (Signed)
On continuous 2L by [place]. Has established with hospice.

## 2016-04-22 NOTE — Assessment & Plan Note (Signed)
Established 04/2016.

## 2016-04-22 NOTE — Assessment & Plan Note (Signed)
Has established with hospice. Takes torsemide 40mg  daily. With weight gain and pulmonary rales noted. Will recommend she take extra torsemide 20mg  dose daily for next 3 days then reassess. Also wrote parameters for torsemide extra dose - 1 tab MWF if weekly weight >5 lb gain noted.

## 2016-04-22 NOTE — Assessment & Plan Note (Signed)
Chronic, stable on sertraline 50mg  daily. Also takes lorazepam 1mg  nightly for sleep.

## 2016-04-23 DIAGNOSIS — N183 Chronic kidney disease, stage 3 (moderate): Secondary | ICD-10-CM | POA: Diagnosis not present

## 2016-04-23 DIAGNOSIS — E1122 Type 2 diabetes mellitus with diabetic chronic kidney disease: Secondary | ICD-10-CM | POA: Diagnosis not present

## 2016-04-23 DIAGNOSIS — E1121 Type 2 diabetes mellitus with diabetic nephropathy: Secondary | ICD-10-CM | POA: Diagnosis not present

## 2016-04-23 DIAGNOSIS — I130 Hypertensive heart and chronic kidney disease with heart failure and stage 1 through stage 4 chronic kidney disease, or unspecified chronic kidney disease: Secondary | ICD-10-CM | POA: Diagnosis not present

## 2016-04-23 DIAGNOSIS — J9611 Chronic respiratory failure with hypoxia: Secondary | ICD-10-CM | POA: Diagnosis not present

## 2016-04-23 DIAGNOSIS — I5032 Chronic diastolic (congestive) heart failure: Secondary | ICD-10-CM | POA: Diagnosis not present

## 2016-04-26 DIAGNOSIS — I130 Hypertensive heart and chronic kidney disease with heart failure and stage 1 through stage 4 chronic kidney disease, or unspecified chronic kidney disease: Secondary | ICD-10-CM | POA: Diagnosis not present

## 2016-04-26 DIAGNOSIS — E1121 Type 2 diabetes mellitus with diabetic nephropathy: Secondary | ICD-10-CM | POA: Diagnosis not present

## 2016-04-26 DIAGNOSIS — J9611 Chronic respiratory failure with hypoxia: Secondary | ICD-10-CM | POA: Diagnosis not present

## 2016-04-26 DIAGNOSIS — E1122 Type 2 diabetes mellitus with diabetic chronic kidney disease: Secondary | ICD-10-CM | POA: Diagnosis not present

## 2016-04-26 DIAGNOSIS — N183 Chronic kidney disease, stage 3 (moderate): Secondary | ICD-10-CM | POA: Diagnosis not present

## 2016-04-26 DIAGNOSIS — I5032 Chronic diastolic (congestive) heart failure: Secondary | ICD-10-CM | POA: Diagnosis not present

## 2016-04-27 DIAGNOSIS — N183 Chronic kidney disease, stage 3 (moderate): Secondary | ICD-10-CM | POA: Diagnosis not present

## 2016-04-27 DIAGNOSIS — I130 Hypertensive heart and chronic kidney disease with heart failure and stage 1 through stage 4 chronic kidney disease, or unspecified chronic kidney disease: Secondary | ICD-10-CM | POA: Diagnosis not present

## 2016-04-27 DIAGNOSIS — E1122 Type 2 diabetes mellitus with diabetic chronic kidney disease: Secondary | ICD-10-CM | POA: Diagnosis not present

## 2016-04-27 DIAGNOSIS — J9611 Chronic respiratory failure with hypoxia: Secondary | ICD-10-CM | POA: Diagnosis not present

## 2016-04-27 DIAGNOSIS — I5032 Chronic diastolic (congestive) heart failure: Secondary | ICD-10-CM | POA: Diagnosis not present

## 2016-04-27 DIAGNOSIS — E1121 Type 2 diabetes mellitus with diabetic nephropathy: Secondary | ICD-10-CM | POA: Diagnosis not present

## 2016-04-28 DIAGNOSIS — I130 Hypertensive heart and chronic kidney disease with heart failure and stage 1 through stage 4 chronic kidney disease, or unspecified chronic kidney disease: Secondary | ICD-10-CM | POA: Diagnosis not present

## 2016-04-28 DIAGNOSIS — J9611 Chronic respiratory failure with hypoxia: Secondary | ICD-10-CM | POA: Diagnosis not present

## 2016-04-28 DIAGNOSIS — E1121 Type 2 diabetes mellitus with diabetic nephropathy: Secondary | ICD-10-CM | POA: Diagnosis not present

## 2016-04-28 DIAGNOSIS — N183 Chronic kidney disease, stage 3 (moderate): Secondary | ICD-10-CM | POA: Diagnosis not present

## 2016-04-28 DIAGNOSIS — I5032 Chronic diastolic (congestive) heart failure: Secondary | ICD-10-CM | POA: Diagnosis not present

## 2016-04-28 DIAGNOSIS — E1122 Type 2 diabetes mellitus with diabetic chronic kidney disease: Secondary | ICD-10-CM | POA: Diagnosis not present

## 2016-04-29 DIAGNOSIS — J9611 Chronic respiratory failure with hypoxia: Secondary | ICD-10-CM | POA: Diagnosis not present

## 2016-04-29 DIAGNOSIS — I5032 Chronic diastolic (congestive) heart failure: Secondary | ICD-10-CM | POA: Diagnosis not present

## 2016-04-29 DIAGNOSIS — N183 Chronic kidney disease, stage 3 (moderate): Secondary | ICD-10-CM | POA: Diagnosis not present

## 2016-04-29 DIAGNOSIS — I130 Hypertensive heart and chronic kidney disease with heart failure and stage 1 through stage 4 chronic kidney disease, or unspecified chronic kidney disease: Secondary | ICD-10-CM | POA: Diagnosis not present

## 2016-04-29 DIAGNOSIS — E1121 Type 2 diabetes mellitus with diabetic nephropathy: Secondary | ICD-10-CM | POA: Diagnosis not present

## 2016-04-29 DIAGNOSIS — E1122 Type 2 diabetes mellitus with diabetic chronic kidney disease: Secondary | ICD-10-CM | POA: Diagnosis not present

## 2016-04-30 DIAGNOSIS — I5032 Chronic diastolic (congestive) heart failure: Secondary | ICD-10-CM | POA: Diagnosis not present

## 2016-04-30 DIAGNOSIS — E1121 Type 2 diabetes mellitus with diabetic nephropathy: Secondary | ICD-10-CM | POA: Diagnosis not present

## 2016-04-30 DIAGNOSIS — J9611 Chronic respiratory failure with hypoxia: Secondary | ICD-10-CM | POA: Diagnosis not present

## 2016-04-30 DIAGNOSIS — N183 Chronic kidney disease, stage 3 (moderate): Secondary | ICD-10-CM | POA: Diagnosis not present

## 2016-04-30 DIAGNOSIS — E1122 Type 2 diabetes mellitus with diabetic chronic kidney disease: Secondary | ICD-10-CM | POA: Diagnosis not present

## 2016-04-30 DIAGNOSIS — I130 Hypertensive heart and chronic kidney disease with heart failure and stage 1 through stage 4 chronic kidney disease, or unspecified chronic kidney disease: Secondary | ICD-10-CM | POA: Diagnosis not present

## 2016-05-02 DIAGNOSIS — I25708 Atherosclerosis of coronary artery bypass graft(s), unspecified, with other forms of angina pectoris: Secondary | ICD-10-CM | POA: Diagnosis not present

## 2016-05-02 DIAGNOSIS — R531 Weakness: Secondary | ICD-10-CM | POA: Diagnosis not present

## 2016-05-02 DIAGNOSIS — E1121 Type 2 diabetes mellitus with diabetic nephropathy: Secondary | ICD-10-CM | POA: Diagnosis not present

## 2016-05-02 DIAGNOSIS — I130 Hypertensive heart and chronic kidney disease with heart failure and stage 1 through stage 4 chronic kidney disease, or unspecified chronic kidney disease: Secondary | ICD-10-CM | POA: Diagnosis not present

## 2016-05-02 DIAGNOSIS — S90416D Abrasion, unspecified lesser toe(s), subsequent encounter: Secondary | ICD-10-CM | POA: Diagnosis not present

## 2016-05-02 DIAGNOSIS — I5032 Chronic diastolic (congestive) heart failure: Secondary | ICD-10-CM | POA: Diagnosis not present

## 2016-05-02 DIAGNOSIS — Z7902 Long term (current) use of antithrombotics/antiplatelets: Secondary | ICD-10-CM | POA: Diagnosis not present

## 2016-05-02 DIAGNOSIS — J9611 Chronic respiratory failure with hypoxia: Secondary | ICD-10-CM | POA: Diagnosis not present

## 2016-05-02 DIAGNOSIS — Z7984 Long term (current) use of oral hypoglycemic drugs: Secondary | ICD-10-CM | POA: Diagnosis not present

## 2016-05-02 DIAGNOSIS — S51811D Laceration without foreign body of right forearm, subsequent encounter: Secondary | ICD-10-CM | POA: Diagnosis not present

## 2016-05-02 DIAGNOSIS — E1122 Type 2 diabetes mellitus with diabetic chronic kidney disease: Secondary | ICD-10-CM | POA: Diagnosis not present

## 2016-05-02 DIAGNOSIS — E785 Hyperlipidemia, unspecified: Secondary | ICD-10-CM | POA: Diagnosis not present

## 2016-05-02 DIAGNOSIS — S61411D Laceration without foreign body of right hand, subsequent encounter: Secondary | ICD-10-CM | POA: Diagnosis not present

## 2016-05-02 DIAGNOSIS — N183 Chronic kidney disease, stage 3 (moderate): Secondary | ICD-10-CM | POA: Diagnosis not present

## 2016-05-03 DIAGNOSIS — E1122 Type 2 diabetes mellitus with diabetic chronic kidney disease: Secondary | ICD-10-CM | POA: Diagnosis not present

## 2016-05-03 DIAGNOSIS — N183 Chronic kidney disease, stage 3 (moderate): Secondary | ICD-10-CM | POA: Diagnosis not present

## 2016-05-03 DIAGNOSIS — E1121 Type 2 diabetes mellitus with diabetic nephropathy: Secondary | ICD-10-CM | POA: Diagnosis not present

## 2016-05-03 DIAGNOSIS — I5032 Chronic diastolic (congestive) heart failure: Secondary | ICD-10-CM | POA: Diagnosis not present

## 2016-05-03 DIAGNOSIS — J9611 Chronic respiratory failure with hypoxia: Secondary | ICD-10-CM | POA: Diagnosis not present

## 2016-05-03 DIAGNOSIS — I130 Hypertensive heart and chronic kidney disease with heart failure and stage 1 through stage 4 chronic kidney disease, or unspecified chronic kidney disease: Secondary | ICD-10-CM | POA: Diagnosis not present

## 2016-05-04 DIAGNOSIS — E1121 Type 2 diabetes mellitus with diabetic nephropathy: Secondary | ICD-10-CM | POA: Diagnosis not present

## 2016-05-04 DIAGNOSIS — J9611 Chronic respiratory failure with hypoxia: Secondary | ICD-10-CM | POA: Diagnosis not present

## 2016-05-04 DIAGNOSIS — E1122 Type 2 diabetes mellitus with diabetic chronic kidney disease: Secondary | ICD-10-CM | POA: Diagnosis not present

## 2016-05-04 DIAGNOSIS — I130 Hypertensive heart and chronic kidney disease with heart failure and stage 1 through stage 4 chronic kidney disease, or unspecified chronic kidney disease: Secondary | ICD-10-CM | POA: Diagnosis not present

## 2016-05-04 DIAGNOSIS — I5032 Chronic diastolic (congestive) heart failure: Secondary | ICD-10-CM | POA: Diagnosis not present

## 2016-05-04 DIAGNOSIS — N183 Chronic kidney disease, stage 3 (moderate): Secondary | ICD-10-CM | POA: Diagnosis not present

## 2016-05-05 DIAGNOSIS — I130 Hypertensive heart and chronic kidney disease with heart failure and stage 1 through stage 4 chronic kidney disease, or unspecified chronic kidney disease: Secondary | ICD-10-CM | POA: Diagnosis not present

## 2016-05-05 DIAGNOSIS — J9611 Chronic respiratory failure with hypoxia: Secondary | ICD-10-CM | POA: Diagnosis not present

## 2016-05-05 DIAGNOSIS — N183 Chronic kidney disease, stage 3 (moderate): Secondary | ICD-10-CM | POA: Diagnosis not present

## 2016-05-05 DIAGNOSIS — E1122 Type 2 diabetes mellitus with diabetic chronic kidney disease: Secondary | ICD-10-CM | POA: Diagnosis not present

## 2016-05-05 DIAGNOSIS — E1121 Type 2 diabetes mellitus with diabetic nephropathy: Secondary | ICD-10-CM | POA: Diagnosis not present

## 2016-05-05 DIAGNOSIS — I5032 Chronic diastolic (congestive) heart failure: Secondary | ICD-10-CM | POA: Diagnosis not present

## 2016-05-06 DIAGNOSIS — J9611 Chronic respiratory failure with hypoxia: Secondary | ICD-10-CM | POA: Diagnosis not present

## 2016-05-06 DIAGNOSIS — E1121 Type 2 diabetes mellitus with diabetic nephropathy: Secondary | ICD-10-CM | POA: Diagnosis not present

## 2016-05-06 DIAGNOSIS — E1122 Type 2 diabetes mellitus with diabetic chronic kidney disease: Secondary | ICD-10-CM | POA: Diagnosis not present

## 2016-05-06 DIAGNOSIS — I5032 Chronic diastolic (congestive) heart failure: Secondary | ICD-10-CM | POA: Diagnosis not present

## 2016-05-06 DIAGNOSIS — N183 Chronic kidney disease, stage 3 (moderate): Secondary | ICD-10-CM | POA: Diagnosis not present

## 2016-05-06 DIAGNOSIS — I130 Hypertensive heart and chronic kidney disease with heart failure and stage 1 through stage 4 chronic kidney disease, or unspecified chronic kidney disease: Secondary | ICD-10-CM | POA: Diagnosis not present

## 2016-05-07 DIAGNOSIS — N183 Chronic kidney disease, stage 3 (moderate): Secondary | ICD-10-CM | POA: Diagnosis not present

## 2016-05-07 DIAGNOSIS — E1122 Type 2 diabetes mellitus with diabetic chronic kidney disease: Secondary | ICD-10-CM | POA: Diagnosis not present

## 2016-05-07 DIAGNOSIS — I130 Hypertensive heart and chronic kidney disease with heart failure and stage 1 through stage 4 chronic kidney disease, or unspecified chronic kidney disease: Secondary | ICD-10-CM | POA: Diagnosis not present

## 2016-05-07 DIAGNOSIS — J9611 Chronic respiratory failure with hypoxia: Secondary | ICD-10-CM | POA: Diagnosis not present

## 2016-05-07 DIAGNOSIS — I5032 Chronic diastolic (congestive) heart failure: Secondary | ICD-10-CM | POA: Diagnosis not present

## 2016-05-07 DIAGNOSIS — E1121 Type 2 diabetes mellitus with diabetic nephropathy: Secondary | ICD-10-CM | POA: Diagnosis not present

## 2016-05-10 DIAGNOSIS — E1122 Type 2 diabetes mellitus with diabetic chronic kidney disease: Secondary | ICD-10-CM | POA: Diagnosis not present

## 2016-05-10 DIAGNOSIS — I130 Hypertensive heart and chronic kidney disease with heart failure and stage 1 through stage 4 chronic kidney disease, or unspecified chronic kidney disease: Secondary | ICD-10-CM | POA: Diagnosis not present

## 2016-05-10 DIAGNOSIS — J9611 Chronic respiratory failure with hypoxia: Secondary | ICD-10-CM | POA: Diagnosis not present

## 2016-05-10 DIAGNOSIS — I5032 Chronic diastolic (congestive) heart failure: Secondary | ICD-10-CM | POA: Diagnosis not present

## 2016-05-10 DIAGNOSIS — E1121 Type 2 diabetes mellitus with diabetic nephropathy: Secondary | ICD-10-CM | POA: Diagnosis not present

## 2016-05-10 DIAGNOSIS — N183 Chronic kidney disease, stage 3 (moderate): Secondary | ICD-10-CM | POA: Diagnosis not present

## 2016-05-11 DIAGNOSIS — N183 Chronic kidney disease, stage 3 (moderate): Secondary | ICD-10-CM | POA: Diagnosis not present

## 2016-05-11 DIAGNOSIS — I130 Hypertensive heart and chronic kidney disease with heart failure and stage 1 through stage 4 chronic kidney disease, or unspecified chronic kidney disease: Secondary | ICD-10-CM | POA: Diagnosis not present

## 2016-05-11 DIAGNOSIS — E1122 Type 2 diabetes mellitus with diabetic chronic kidney disease: Secondary | ICD-10-CM | POA: Diagnosis not present

## 2016-05-11 DIAGNOSIS — I5032 Chronic diastolic (congestive) heart failure: Secondary | ICD-10-CM | POA: Diagnosis not present

## 2016-05-11 DIAGNOSIS — J9611 Chronic respiratory failure with hypoxia: Secondary | ICD-10-CM | POA: Diagnosis not present

## 2016-05-11 DIAGNOSIS — E1121 Type 2 diabetes mellitus with diabetic nephropathy: Secondary | ICD-10-CM | POA: Diagnosis not present

## 2016-05-12 DIAGNOSIS — E1122 Type 2 diabetes mellitus with diabetic chronic kidney disease: Secondary | ICD-10-CM | POA: Diagnosis not present

## 2016-05-12 DIAGNOSIS — I5032 Chronic diastolic (congestive) heart failure: Secondary | ICD-10-CM | POA: Diagnosis not present

## 2016-05-12 DIAGNOSIS — N183 Chronic kidney disease, stage 3 (moderate): Secondary | ICD-10-CM | POA: Diagnosis not present

## 2016-05-12 DIAGNOSIS — E1121 Type 2 diabetes mellitus with diabetic nephropathy: Secondary | ICD-10-CM | POA: Diagnosis not present

## 2016-05-12 DIAGNOSIS — J9611 Chronic respiratory failure with hypoxia: Secondary | ICD-10-CM | POA: Diagnosis not present

## 2016-05-12 DIAGNOSIS — I130 Hypertensive heart and chronic kidney disease with heart failure and stage 1 through stage 4 chronic kidney disease, or unspecified chronic kidney disease: Secondary | ICD-10-CM | POA: Diagnosis not present

## 2016-05-13 DIAGNOSIS — N183 Chronic kidney disease, stage 3 (moderate): Secondary | ICD-10-CM | POA: Diagnosis not present

## 2016-05-13 DIAGNOSIS — J9611 Chronic respiratory failure with hypoxia: Secondary | ICD-10-CM | POA: Diagnosis not present

## 2016-05-13 DIAGNOSIS — I5032 Chronic diastolic (congestive) heart failure: Secondary | ICD-10-CM | POA: Diagnosis not present

## 2016-05-13 DIAGNOSIS — I130 Hypertensive heart and chronic kidney disease with heart failure and stage 1 through stage 4 chronic kidney disease, or unspecified chronic kidney disease: Secondary | ICD-10-CM | POA: Diagnosis not present

## 2016-05-13 DIAGNOSIS — E1121 Type 2 diabetes mellitus with diabetic nephropathy: Secondary | ICD-10-CM | POA: Diagnosis not present

## 2016-05-13 DIAGNOSIS — E1122 Type 2 diabetes mellitus with diabetic chronic kidney disease: Secondary | ICD-10-CM | POA: Diagnosis not present

## 2016-05-14 DIAGNOSIS — I5032 Chronic diastolic (congestive) heart failure: Secondary | ICD-10-CM | POA: Diagnosis not present

## 2016-05-14 DIAGNOSIS — J9611 Chronic respiratory failure with hypoxia: Secondary | ICD-10-CM | POA: Diagnosis not present

## 2016-05-14 DIAGNOSIS — E1122 Type 2 diabetes mellitus with diabetic chronic kidney disease: Secondary | ICD-10-CM | POA: Diagnosis not present

## 2016-05-14 DIAGNOSIS — E1121 Type 2 diabetes mellitus with diabetic nephropathy: Secondary | ICD-10-CM | POA: Diagnosis not present

## 2016-05-14 DIAGNOSIS — I130 Hypertensive heart and chronic kidney disease with heart failure and stage 1 through stage 4 chronic kidney disease, or unspecified chronic kidney disease: Secondary | ICD-10-CM | POA: Diagnosis not present

## 2016-05-14 DIAGNOSIS — N183 Chronic kidney disease, stage 3 (moderate): Secondary | ICD-10-CM | POA: Diagnosis not present

## 2016-05-17 DIAGNOSIS — J9611 Chronic respiratory failure with hypoxia: Secondary | ICD-10-CM | POA: Diagnosis not present

## 2016-05-17 DIAGNOSIS — N183 Chronic kidney disease, stage 3 (moderate): Secondary | ICD-10-CM | POA: Diagnosis not present

## 2016-05-17 DIAGNOSIS — I130 Hypertensive heart and chronic kidney disease with heart failure and stage 1 through stage 4 chronic kidney disease, or unspecified chronic kidney disease: Secondary | ICD-10-CM | POA: Diagnosis not present

## 2016-05-17 DIAGNOSIS — I5032 Chronic diastolic (congestive) heart failure: Secondary | ICD-10-CM | POA: Diagnosis not present

## 2016-05-17 DIAGNOSIS — E1122 Type 2 diabetes mellitus with diabetic chronic kidney disease: Secondary | ICD-10-CM | POA: Diagnosis not present

## 2016-05-17 DIAGNOSIS — E1121 Type 2 diabetes mellitus with diabetic nephropathy: Secondary | ICD-10-CM | POA: Diagnosis not present

## 2016-05-18 DIAGNOSIS — E1122 Type 2 diabetes mellitus with diabetic chronic kidney disease: Secondary | ICD-10-CM | POA: Diagnosis not present

## 2016-05-18 DIAGNOSIS — N183 Chronic kidney disease, stage 3 (moderate): Secondary | ICD-10-CM | POA: Diagnosis not present

## 2016-05-18 DIAGNOSIS — I5032 Chronic diastolic (congestive) heart failure: Secondary | ICD-10-CM | POA: Diagnosis not present

## 2016-05-18 DIAGNOSIS — J9611 Chronic respiratory failure with hypoxia: Secondary | ICD-10-CM | POA: Diagnosis not present

## 2016-05-18 DIAGNOSIS — I130 Hypertensive heart and chronic kidney disease with heart failure and stage 1 through stage 4 chronic kidney disease, or unspecified chronic kidney disease: Secondary | ICD-10-CM | POA: Diagnosis not present

## 2016-05-18 DIAGNOSIS — E1121 Type 2 diabetes mellitus with diabetic nephropathy: Secondary | ICD-10-CM | POA: Diagnosis not present

## 2016-05-19 DIAGNOSIS — I5032 Chronic diastolic (congestive) heart failure: Secondary | ICD-10-CM | POA: Diagnosis not present

## 2016-05-19 DIAGNOSIS — J9611 Chronic respiratory failure with hypoxia: Secondary | ICD-10-CM | POA: Diagnosis not present

## 2016-05-19 DIAGNOSIS — N183 Chronic kidney disease, stage 3 (moderate): Secondary | ICD-10-CM | POA: Diagnosis not present

## 2016-05-19 DIAGNOSIS — E1122 Type 2 diabetes mellitus with diabetic chronic kidney disease: Secondary | ICD-10-CM | POA: Diagnosis not present

## 2016-05-19 DIAGNOSIS — I130 Hypertensive heart and chronic kidney disease with heart failure and stage 1 through stage 4 chronic kidney disease, or unspecified chronic kidney disease: Secondary | ICD-10-CM | POA: Diagnosis not present

## 2016-05-19 DIAGNOSIS — E1121 Type 2 diabetes mellitus with diabetic nephropathy: Secondary | ICD-10-CM | POA: Diagnosis not present

## 2016-05-20 DIAGNOSIS — E1122 Type 2 diabetes mellitus with diabetic chronic kidney disease: Secondary | ICD-10-CM | POA: Diagnosis not present

## 2016-05-20 DIAGNOSIS — I130 Hypertensive heart and chronic kidney disease with heart failure and stage 1 through stage 4 chronic kidney disease, or unspecified chronic kidney disease: Secondary | ICD-10-CM | POA: Diagnosis not present

## 2016-05-20 DIAGNOSIS — E1121 Type 2 diabetes mellitus with diabetic nephropathy: Secondary | ICD-10-CM | POA: Diagnosis not present

## 2016-05-20 DIAGNOSIS — I5032 Chronic diastolic (congestive) heart failure: Secondary | ICD-10-CM | POA: Diagnosis not present

## 2016-05-20 DIAGNOSIS — J9611 Chronic respiratory failure with hypoxia: Secondary | ICD-10-CM | POA: Diagnosis not present

## 2016-05-20 DIAGNOSIS — N183 Chronic kidney disease, stage 3 (moderate): Secondary | ICD-10-CM | POA: Diagnosis not present

## 2016-05-21 DIAGNOSIS — N183 Chronic kidney disease, stage 3 (moderate): Secondary | ICD-10-CM | POA: Diagnosis not present

## 2016-05-21 DIAGNOSIS — E1121 Type 2 diabetes mellitus with diabetic nephropathy: Secondary | ICD-10-CM | POA: Diagnosis not present

## 2016-05-21 DIAGNOSIS — E1122 Type 2 diabetes mellitus with diabetic chronic kidney disease: Secondary | ICD-10-CM | POA: Diagnosis not present

## 2016-05-21 DIAGNOSIS — I5032 Chronic diastolic (congestive) heart failure: Secondary | ICD-10-CM | POA: Diagnosis not present

## 2016-05-21 DIAGNOSIS — I130 Hypertensive heart and chronic kidney disease with heart failure and stage 1 through stage 4 chronic kidney disease, or unspecified chronic kidney disease: Secondary | ICD-10-CM | POA: Diagnosis not present

## 2016-05-21 DIAGNOSIS — J9611 Chronic respiratory failure with hypoxia: Secondary | ICD-10-CM | POA: Diagnosis not present

## 2016-05-23 NOTE — Progress Notes (Signed)
Cardiology Office Note  Date:  05/24/2016   ID:  ARDINE IACOVELLI, DOB 01/22/1929, MRN [id number]  PCP:  Ria Bush, MD   Chief Complaint  Patient presents with  . other    38mof/u. PT c/o cp and sob at times. Pt states she is currently on hospice and was told she no longer had to come after today, but it was up to the doctor. Reviewed meds with pt verbally.    HPI:  Ms. HBenisonis a very pleasant 83-year-old woman with history of  coronary artery disease, bypass surgery in 2004, hyperlipidemia, PCI 6 months after her bypass, repeat PCI one year later  (She does report having a stroke after her cardiac catheterization),  diabetes, hypertension,  spinal stenosis,   chronic unsteady gait , history of falls,   restrictive lung disease.   [facility] chronic hypoxemic respiratory failure with both obstructive and restrictive components and respiratory muscle weakness who is oxygen dependent. who presents for routine followup of her shortness of breath, chronic diastolic CHF.  Reports she is on hospice,  Had dramatic weight loss "from GI bug" Weight today back up, 126 pounds Drinking milk shake "some chronic nausea" Lots of questions about hospice and what it means Long discussions with her today Was told by hospice that she does not need to see any of her doctors  Stable shortness of breath, oxygen 2-3 L by nasal cannula Denies any significant chest pain concerning for angina She does report it is a long walk down to the cafeteria, gets tired Having hard bowel movements, takes Colace daily  Takes torsemide 40 mg daily Consistent with her diuretic regiment.  Previous episodes of chest pain, takes nitroglycerin periodically on her own   previous echocardiogram showing normal ejection fraction, normal right heart pressures no significant valve disease  She sees pulmonary for her underlying pulmonary issues, [place]  EKG on today's visit shows normal sinus  rhythm with rate 69 bpm, nonspecific T-wave abnormality  Other past medical history  mechanical fall February 2016 with [facility] fracture. [facility] with several screws.  Transfer back to rehabilitation. She is not full weightbearing at this time. Partial weightbearing with a walker. Gait continues to be unsteady.  History of chronic nausea, on Zofran  Anorexia has been going on since November 2015 with significant weight loss since her prior clinic visit.  Previously was on stool softeners, does not taking this on a regular basis.  No falls or syncope.  No regular exercise program.  Denies any cough. On oxygen, level at 2 L even with ambulation.  Previous cardiac CTA in the past for atypical chest pain that showed: Patent SVG to D1 with poor distal runoff,  Occluded LIMA to LAD.  Distal LAD never visualized,  RCA and circumflex patient with multiple 50% or less calcfic Lesions,  Poor distal runoff from stented IM/LAD and small D1 supplied by SVG  Previous  Echocardiogram was essentially normal with normal ejection fraction greater than 541% diastolic dysfunction, normal right ventricular systolic pressures  Stress test showed no ischemia with ejection fraction greater than 55%   12/2011 In the hospital, total cholesterol 146, LDL 58, HDL 65  PMH:   has a past medical history of Allergic rhinitis; Benign essential tremor; Breathing difficulty (2011); CAD (coronary artery disease); Chronic kidney disease; CVA (cerebral infarction); Diabetes mellitus (2003); Diastolic CHF ([place] (29/3790; Dry senile  macular degeneration; Fracture of femoral neck, right ([place]) (03/2014); Gait instability; GERD (gastroesophageal reflux disease) (2004); Hearing loss of both ears (08/2010); Herpes zoster ophthalmicus (2009); History of chicken pox; History of diverticulitis of colon (1991, 1992, 1994); History of kidney stones  (1952); Hyperlipidemia; Hypertension; Leg weakness, bilateral (2014); Lumbar spinal stenosis; Squamous cell skin cancer, upper arm (03/2015); Syncope (2005); Urinary incontinence; Vitamin D deficiency; and Wedge compression fracture of T10 vertebra ([place]) (05/28/2013).  PSH:    Past Surgical History:  Procedure Laterality Date  . APPENDECTOMY  1948  . BREAST BIOPSY  2001   x2, both benign, last mammo 2011, last pap smear 2011  . CARDIAC CATHETERIZATION  2009   x5  . CARDIOVASCULAR STRESS TEST  2010   nuclear, normal  . CATARACT EXTRACTION     bilateral, corneal dystrophy  . CERVICAL FUSION  2002   anterior decompression and fusion C6/7  . CHOLECYSTECTOMY  1985  . CORNEAL TRANSPLANT     x3  . CORONARY ARTERY BYPASS GRAFT  2004  . DEXA  2005   normal  . HIP FRACTURE SURGERY Right 03/2014   closed reduction percutaneous pinning of R femoral neck fracture ([place], [facility])  . LUMBAR SPINE SURGERY  2010   laminotomy (L2/3, 3/4)  . PERCUTANEOUS CORONARY STENT INTERVENTION (PCI-S)  2005   stents x 2 (intermediate and LAD), 2nd complicated by stroke    Current Outpatient Prescriptions  Medication Sig Dispense Refill  . acetaminophen (TYLENOL) 500 MG tablet Take 500 mg by mouth 3 (three) times daily.    . ASPIRIN LOW DOSE 81 MG chewable tablet TAKE 1 TABLET BY MOUTH ONCE DAILY 30 tablet 6  . atorvastatin (LIPITOR) 20 MG tablet TAKE 1 TABLET NIGHTLY 90 tablet 1  . BD AUTOSHIELD DUO 30G X 5 MM MISC Use as instructed with insulin Dx: E11.21 100 each 3  . cetirizine (ZYRTEC) 10 MG tablet Take 1 tablet (10 mg total) by mouth daily as needed for allergies.    Marland Kitchen clopidogrel (PLAVIX) 75 MG tablet TAKE 1 TABLET DAILY 90 tablet 3  . docusate sodium (COLACE) 100 MG capsule Take 1 capsule (100 mg total) by mouth daily. 90 capsule 1  . glucose blood (FREESTYLE LITE) test strip Use to check sugar three times daily Dx: 250.40 100 each 3  . glucose monitoring kit (FREESTYLE) monitoring kit 1 each by Does not  apply route as needed for other. 1 each 0  . isosorbide mononitrate (IMDUR) 30 MG 24 hr tablet TAKE 1 TABLET AT BEDTIME 90 tablet 3  . KLOR-CON M10 10 MEQ tablet TAKE 2 TABLETS (20 MEQ TOTAL) DAILY 180 tablet 1  . Lancets (FREESTYLE) lancets USE TO CHECK SUGAR THREE TIMES A DAY 300 each 2  . LANTUS SOLOSTAR 100 UNIT/ML Solostar Pen INJECT 10 UNITS UNDER THE SKIN DAILY WITH BREAKFAST (DIRECTION CHANGE) 15 mL 6  . LORazepam (ATIVAN) 1 MG tablet Take 1 tablet (1 mg total) by mouth at bedtime. insomnia 90 tablet 1  . magnesium hydroxide (MILK OF MAGNESIA) 800 MG/5ML suspension Take by mouth daily as needed for constipation.    . meclizine (ANTIVERT) 12.5 MG tablet One tablet by mouth daily as needed for vertigo 30 tablet 0  . metFORMIN (GLUCOPHAGE-XR) 500 MG 24 hr tablet Take 1 tablet (500 mg total) by mouth at bedtime. 90 tablet 3  . metoprolol tartrate (LOPRESSOR) 25 MG tablet TAKE 1 TABLET TWICE A DAY 180 tablet 3  . Multiple Vitamin (MULTIVITAMIN) capsule Take 1 capsule  by mouth daily.      . nitroGLYCERIN (NITROSTAT) 0.4 MG SL tablet DISSOLVE 1 TABLET UNDER THE TONGUE AS NEEDED 30 tablet 3  . ondansetron (ZOFRAN) 4 MG tablet Take 1 tablet (4 mg total) by mouth 2 (two) times daily as needed for nausea or vomiting. 30 tablet 1  . prednisoLONE sodium phosphate (INFLAMASE FORTE) 1 % ophthalmic solution Place 1 drop into both eyes daily. 5 mL 3  . RABEprazole (ACIPHEX) 20 MG tablet TAKE 1 TABLET DAILY 90 tablet 1  . sertraline (ZOLOFT) 50 MG tablet Take 1 tablet (50 mg total) by mouth daily. 90 tablet 3  . SPIRIVA HANDIHALER 18 MCG inhalation capsule PLACE 1 CAPSULE INTO INHALER AND INHALE THE CONTENTS OF 1 CAPSULE DAILY 90 capsule 2  . torsemide (DEMADEX) 20 MG tablet Take 2 tablets every morning and 1 extra tablet on Monday, Wednesday and Friday if needed. 225 tablet 1  . traMADol (ULTRAM) 50 MG tablet TAKE ONE TABLET BY MOUTH 2 TIMES A DAY AT 3PM AND 10PM 60 tablet 0  . traZODone (DESYREL) 50 MG  tablet TAKE ONE-HALF (1/2) TO 1 TABLET AT BEDTIME AS NEEDED FOR SLEEP 90 tablet 1  . Vitamin D, Ergocalciferol, (DRISDOL) 50000 units CAPS capsule TAKE 1 CAPSULE ONCE EVERY WEEK 12 capsule 1   No current facility-administered medications for this visit.      Allergies:   Actos [pioglitazone hydrochloride]; Pioglitazone; Atenolol; Codeine; Eszopiclone; Eszopiclone; Exenatide; Morphine; Sulfa antibiotics; Sulfacetamide sodium; Vicodin [hydrocodone-acetaminophen]; Exenatide; and Hydrocodone-acetaminophen   Social History:  The patient  reports that she has never smoked. She has never used smokeless tobacco. She reports that she does not drink alcohol or use drugs.   Family History:   family history includes Cancer in her brother and mother; Heart disease in her father.    Review of Systems: Review of Systems  Constitutional: Negative.   Respiratory: Positive for shortness of breath.   Cardiovascular: Negative.   Gastrointestinal: Negative.   Musculoskeletal: Negative.        Gait instability  Neurological: Negative.   Psychiatric/Behavioral: Positive for depression.  All other systems reviewed and are negative.    PHYSICAL EXAM: VS:  BP (!) 100/56 (BP Location: Left Arm, Patient Position: Sitting, Cuff Size: Normal)   Pulse 69   Ht 5' 4.5" (1.638 m)   Wt 126 lb (57.2 kg)   BMI 21.29 kg/m  , BMI Body mass index is 21.29 kg/m. GEN: Well nourished, well developed, in no acute distress , on nasal cannula oxygen HEENT: normal  Neck: no JVD, carotid bruits, or masses Cardiac: RRR; no murmurs, rubs, or gallops,no edema  Respiratory:  Scattered Rales,  normal work of breathing GI: soft, nontender, nondistended, + BS MS: no deformity or atrophy  Skin: warm and dry, no rash Neuro:  Strength and sensation are intact Psych: euthymic mood, full affect    Recent Labs: 02/15/2016: Hemoglobin 11.8; Platelets 178 03/23/2016: BUN 10; Creatinine, Ser 0.74; Potassium 3.5; Sodium 140     Lipid Panel Lab Results  Component Value Date   CHOL 184 10/08/2015   HDL 49.20 10/08/2015   LDLCALC 95 10/08/2015   TRIG 199.0 (H) 10/08/2015      Wt Readings from Last 3 Encounters:  05/24/16 126 lb (57.2 kg)  04/20/16 130 lb 8 oz (59.2 kg)  03/23/16 115 lb 1.9 oz (52.2 kg)       ASSESSMENT AND PLAN:   Coronary artery disease involving coronary bypass graft of native heart without  angina pectoris - Plan: EKG 12-Lead Rare chest abdominal and back discomfort Takes nitroglycerin as needed  Chronic diastolic CHF (congestive heart failure) ([place]) - Plan: EKG 12-Lead Appears relatively euvolemic on today's visit,  continue torsemide 40 daily  Essential hypertension - Plan: EKG 12-Lead Blood pressure is well controlled on today's visit. No changes made to the medications.  Pure hypercholesterolemia Continue Lipitor, goal LDL less than 70  Depression, unspecified depression type Appears less depressed than on her last clinic visit  Hospice Most of her  visit was spent discussing hospice, what it means She has a difficult time processing   Total encounter time more than 25 minutes  Greater than 50% was spent in counseling and coordination of care with the patient   Disposition:   F/U  6 months   Orders Placed This Encounter  Procedures  . EKG 12-Lead     Signed, Esmond Plants, M.D., Ph.D. 05/24/2016  [place], [place]

## 2016-05-24 ENCOUNTER — Encounter: Payer: Self-pay | Admitting: Cardiovascular Disease

## 2016-05-24 ENCOUNTER — Ambulatory Visit (INDEPENDENT_AMBULATORY_CARE_PROVIDER_SITE_OTHER): Admitting: Cardiovascular Disease

## 2016-05-24 ENCOUNTER — Other Ambulatory Visit: Payer: Self-pay | Admitting: Family Medicine

## 2016-05-24 VITALS — BP 100/56 | HR 69 | Ht 64.5 in | Wt 126.0 lb

## 2016-05-24 DIAGNOSIS — N183 Chronic kidney disease, stage 3 unspecified: Secondary | ICD-10-CM

## 2016-05-24 DIAGNOSIS — Z515 Encounter for palliative care: Secondary | ICD-10-CM

## 2016-05-24 DIAGNOSIS — R531 Weakness: Secondary | ICD-10-CM

## 2016-05-24 DIAGNOSIS — I25708 Atherosclerosis of coronary artery bypass graft(s), unspecified, with other forms of angina pectoris: Secondary | ICD-10-CM

## 2016-05-24 DIAGNOSIS — E1121 Type 2 diabetes mellitus with diabetic nephropathy: Secondary | ICD-10-CM

## 2016-05-24 DIAGNOSIS — E1122 Type 2 diabetes mellitus with diabetic chronic kidney disease: Secondary | ICD-10-CM | POA: Diagnosis not present

## 2016-05-24 DIAGNOSIS — I209 Angina pectoris, unspecified: Secondary | ICD-10-CM

## 2016-05-24 DIAGNOSIS — I10 Essential (primary) hypertension: Secondary | ICD-10-CM

## 2016-05-24 DIAGNOSIS — J9611 Chronic respiratory failure with hypoxia: Secondary | ICD-10-CM

## 2016-05-24 DIAGNOSIS — I130 Hypertensive heart and chronic kidney disease with heart failure and stage 1 through stage 4 chronic kidney disease, or unspecified chronic kidney disease: Secondary | ICD-10-CM | POA: Diagnosis not present

## 2016-05-24 DIAGNOSIS — E7800 Pure hypercholesterolemia, unspecified: Secondary | ICD-10-CM

## 2016-05-24 DIAGNOSIS — I5032 Chronic diastolic (congestive) heart failure: Secondary | ICD-10-CM | POA: Diagnosis not present

## 2016-05-24 NOTE — Patient Instructions (Signed)

## 2016-05-25 ENCOUNTER — Telehealth: Payer: Self-pay | Admitting: Internal Medicine

## 2016-05-25 ENCOUNTER — Telehealth: Payer: Self-pay

## 2016-05-25 ENCOUNTER — Other Ambulatory Visit: Payer: Self-pay | Admitting: Internal Medicine

## 2016-05-25 DIAGNOSIS — E1121 Type 2 diabetes mellitus with diabetic nephropathy: Secondary | ICD-10-CM | POA: Diagnosis not present

## 2016-05-25 DIAGNOSIS — N183 Chronic kidney disease, stage 3 (moderate): Secondary | ICD-10-CM | POA: Diagnosis not present

## 2016-05-25 DIAGNOSIS — I5032 Chronic diastolic (congestive) heart failure: Secondary | ICD-10-CM | POA: Diagnosis not present

## 2016-05-25 DIAGNOSIS — J9611 Chronic respiratory failure with hypoxia: Secondary | ICD-10-CM | POA: Diagnosis not present

## 2016-05-25 DIAGNOSIS — E1122 Type 2 diabetes mellitus with diabetic chronic kidney disease: Secondary | ICD-10-CM | POA: Diagnosis not present

## 2016-05-25 DIAGNOSIS — I130 Hypertensive heart and chronic kidney disease with heart failure and stage 1 through stage 4 chronic kidney disease, or unspecified chronic kidney disease: Secondary | ICD-10-CM | POA: Diagnosis not present

## 2016-05-25 MED ORDER — TRAMADOL HCL 50 MG PO TABS: ORAL_TABLET | ORAL | 0 refills | Status: DC

## 2016-05-25 NOTE — Telephone Encounter (Signed)
Call from [facility] @ the pt's residence, they need a RF on tramadol, states they apparently did not get a RF, will forward message to PCP

## 2016-05-25 NOTE — Telephone Encounter (Signed)
Christine Blanchard with [name] request printed rx for tramadol and faxed to [phone]. No rush pt has 20 pills at this time but wanted enough time to get rx before gets low. Last refilled # 60 on 01/27/16 per med list and last seen 04/20/16.

## 2016-05-25 NOTE — Telephone Encounter (Signed)
Printed and in Kim's box 

## 2016-05-26 ENCOUNTER — Other Ambulatory Visit: Payer: Self-pay | Admitting: Internal Medicine

## 2016-05-26 ENCOUNTER — Other Ambulatory Visit: Payer: Self-pay | Admitting: Family Medicine

## 2016-05-26 DIAGNOSIS — E1121 Type 2 diabetes mellitus with diabetic nephropathy: Secondary | ICD-10-CM | POA: Diagnosis not present

## 2016-05-26 DIAGNOSIS — J9611 Chronic respiratory failure with hypoxia: Secondary | ICD-10-CM | POA: Diagnosis not present

## 2016-05-26 DIAGNOSIS — I130 Hypertensive heart and chronic kidney disease with heart failure and stage 1 through stage 4 chronic kidney disease, or unspecified chronic kidney disease: Secondary | ICD-10-CM | POA: Diagnosis not present

## 2016-05-26 DIAGNOSIS — I5032 Chronic diastolic (congestive) heart failure: Secondary | ICD-10-CM | POA: Diagnosis not present

## 2016-05-26 DIAGNOSIS — N183 Chronic kidney disease, stage 3 (moderate): Secondary | ICD-10-CM | POA: Diagnosis not present

## 2016-05-26 DIAGNOSIS — E1122 Type 2 diabetes mellitus with diabetic chronic kidney disease: Secondary | ICD-10-CM | POA: Diagnosis not present

## 2016-05-26 NOTE — Telephone Encounter (Signed)
Melissa said cannot accept copy of a hard copy rx; I advised if we fax the actual rx it will appear as a copy ath the end of the fax. Melissa said cancelled tramadol order to [facility] and [facility] [phone]. Melissa request cb when completed.

## 2016-05-26 NOTE — Telephone Encounter (Signed)
Rx faxed as requested.

## 2016-05-26 NOTE — Telephone Encounter (Signed)
Dr Darnell Level has already addressed this TH note.

## 2016-05-26 NOTE — Telephone Encounter (Signed)
Rx will be faxed today

## 2016-05-26 NOTE — Telephone Encounter (Signed)
PLEASE NOTE: All timestamps contained within this report are represented as [place] Standard Time. CONFIDENTIALTY NOTICE: This fax transmission is intended only for the addressee. It contains information that is legally privileged, confidential or otherwise protected from use or disclosure. If you are not the intended recipient, you are strictly prohibited from reviewing, disclosing, copying using or disseminating any of this information or taking any action in reliance on or regarding this information. If you have received this fax in error, please notify us immediately by telephone so that we can arrange for its return to [place]. Phone: [phone], Toll-Free: [phone], Fax: [phone] Page: 1 of 1 Call Id: [id number] [facility] Night - Client Nonclinical Telephone Record [place] Night - Client Client Site [place] Physician Ria Bush - MD Contact Type Call Who Is Calling Physician / Provider / Hospital Call Type Provider [facility] Reason for Call Request to speak to Physician Initial Comment Terry Abila needs orders for a pt medication (Tramadol) Additional Comment Patient Name Christine Blanchard Patient DOB 1928/11/24 Requesting Provider [facility] Physician Number [place] Name [place] Phone DateTime Result/Outcome Message Type Notes Kathlene November - MD [phone] 05/25/2016 9:43:20 PM Paged On Call Back to Call Center Doctor Paged Please call [place] with your answering service at [phone]. Thank you. Kathlene November - MD 05/25/2016 9:51:36 PM Spoke with On Call - General Message Result Call Closed By: Sherilyn Dacosta Transaction Date/Time: 05/25/2016 9:32:18 PM (ET)

## 2016-05-27 DIAGNOSIS — E1122 Type 2 diabetes mellitus with diabetic chronic kidney disease: Secondary | ICD-10-CM | POA: Diagnosis not present

## 2016-05-27 DIAGNOSIS — I130 Hypertensive heart and chronic kidney disease with heart failure and stage 1 through stage 4 chronic kidney disease, or unspecified chronic kidney disease: Secondary | ICD-10-CM | POA: Diagnosis not present

## 2016-05-27 DIAGNOSIS — E1121 Type 2 diabetes mellitus with diabetic nephropathy: Secondary | ICD-10-CM | POA: Diagnosis not present

## 2016-05-27 DIAGNOSIS — I5032 Chronic diastolic (congestive) heart failure: Secondary | ICD-10-CM | POA: Diagnosis not present

## 2016-05-27 DIAGNOSIS — N183 Chronic kidney disease, stage 3 (moderate): Secondary | ICD-10-CM | POA: Diagnosis not present

## 2016-05-27 DIAGNOSIS — J9611 Chronic respiratory failure with hypoxia: Secondary | ICD-10-CM | POA: Diagnosis not present

## 2016-05-27 NOTE — Telephone Encounter (Signed)
[facility]ed to [facility] as requested.

## 2016-05-28 DIAGNOSIS — E1121 Type 2 diabetes mellitus with diabetic nephropathy: Secondary | ICD-10-CM | POA: Diagnosis not present

## 2016-05-28 DIAGNOSIS — E1122 Type 2 diabetes mellitus with diabetic chronic kidney disease: Secondary | ICD-10-CM | POA: Diagnosis not present

## 2016-05-28 DIAGNOSIS — N183 Chronic kidney disease, stage 3 (moderate): Secondary | ICD-10-CM | POA: Diagnosis not present

## 2016-05-28 DIAGNOSIS — I130 Hypertensive heart and chronic kidney disease with heart failure and stage 1 through stage 4 chronic kidney disease, or unspecified chronic kidney disease: Secondary | ICD-10-CM | POA: Diagnosis not present

## 2016-05-28 DIAGNOSIS — J9611 Chronic respiratory failure with hypoxia: Secondary | ICD-10-CM | POA: Diagnosis not present

## 2016-05-28 DIAGNOSIS — I5032 Chronic diastolic (congestive) heart failure: Secondary | ICD-10-CM | POA: Diagnosis not present

## 2016-05-31 ENCOUNTER — Other Ambulatory Visit: Payer: Self-pay | Admitting: Family Medicine

## 2016-05-31 DIAGNOSIS — N183 Chronic kidney disease, stage 3 (moderate): Secondary | ICD-10-CM | POA: Diagnosis not present

## 2016-05-31 DIAGNOSIS — I5032 Chronic diastolic (congestive) heart failure: Secondary | ICD-10-CM | POA: Diagnosis not present

## 2016-05-31 DIAGNOSIS — I130 Hypertensive heart and chronic kidney disease with heart failure and stage 1 through stage 4 chronic kidney disease, or unspecified chronic kidney disease: Secondary | ICD-10-CM | POA: Diagnosis not present

## 2016-05-31 DIAGNOSIS — E1122 Type 2 diabetes mellitus with diabetic chronic kidney disease: Secondary | ICD-10-CM | POA: Diagnosis not present

## 2016-05-31 DIAGNOSIS — J9611 Chronic respiratory failure with hypoxia: Secondary | ICD-10-CM | POA: Diagnosis not present

## 2016-05-31 DIAGNOSIS — E1121 Type 2 diabetes mellitus with diabetic nephropathy: Secondary | ICD-10-CM | POA: Diagnosis not present

## 2016-06-01 DIAGNOSIS — I130 Hypertensive heart and chronic kidney disease with heart failure and stage 1 through stage 4 chronic kidney disease, or unspecified chronic kidney disease: Secondary | ICD-10-CM | POA: Diagnosis not present

## 2016-06-01 DIAGNOSIS — I25708 Atherosclerosis of coronary artery bypass graft(s), unspecified, with other forms of angina pectoris: Secondary | ICD-10-CM | POA: Diagnosis not present

## 2016-06-01 DIAGNOSIS — S51811D Laceration without foreign body of right forearm, subsequent encounter: Secondary | ICD-10-CM | POA: Diagnosis not present

## 2016-06-01 DIAGNOSIS — E1121 Type 2 diabetes mellitus with diabetic nephropathy: Secondary | ICD-10-CM | POA: Diagnosis not present

## 2016-06-01 DIAGNOSIS — S90416D Abrasion, unspecified lesser toe(s), subsequent encounter: Secondary | ICD-10-CM | POA: Diagnosis not present

## 2016-06-01 DIAGNOSIS — J9611 Chronic respiratory failure with hypoxia: Secondary | ICD-10-CM | POA: Diagnosis not present

## 2016-06-01 DIAGNOSIS — Z9981 Dependence on supplemental oxygen: Secondary | ICD-10-CM | POA: Diagnosis not present

## 2016-06-01 DIAGNOSIS — Z9181 History of falling: Secondary | ICD-10-CM | POA: Diagnosis not present

## 2016-06-01 DIAGNOSIS — Z7901 Long term (current) use of anticoagulants: Secondary | ICD-10-CM | POA: Diagnosis not present

## 2016-06-01 DIAGNOSIS — E1122 Type 2 diabetes mellitus with diabetic chronic kidney disease: Secondary | ICD-10-CM | POA: Diagnosis not present

## 2016-06-01 DIAGNOSIS — S61411D Laceration without foreign body of right hand, subsequent encounter: Secondary | ICD-10-CM | POA: Diagnosis not present

## 2016-06-01 DIAGNOSIS — E785 Hyperlipidemia, unspecified: Secondary | ICD-10-CM | POA: Diagnosis not present

## 2016-06-01 DIAGNOSIS — N183 Chronic kidney disease, stage 3 (moderate): Secondary | ICD-10-CM | POA: Diagnosis not present

## 2016-06-01 DIAGNOSIS — Z7902 Long term (current) use of antithrombotics/antiplatelets: Secondary | ICD-10-CM | POA: Diagnosis not present

## 2016-06-01 DIAGNOSIS — I5032 Chronic diastolic (congestive) heart failure: Secondary | ICD-10-CM | POA: Diagnosis not present

## 2016-06-01 DIAGNOSIS — Z7984 Long term (current) use of oral hypoglycemic drugs: Secondary | ICD-10-CM | POA: Diagnosis not present

## 2016-06-01 DIAGNOSIS — R531 Weakness: Secondary | ICD-10-CM | POA: Diagnosis not present

## 2016-06-02 ENCOUNTER — Ambulatory Visit: Payer: Medicare Other | Admitting: Podiatry

## 2016-06-02 DIAGNOSIS — E1121 Type 2 diabetes mellitus with diabetic nephropathy: Secondary | ICD-10-CM | POA: Diagnosis not present

## 2016-06-02 DIAGNOSIS — J9611 Chronic respiratory failure with hypoxia: Secondary | ICD-10-CM | POA: Diagnosis not present

## 2016-06-02 DIAGNOSIS — I5032 Chronic diastolic (congestive) heart failure: Secondary | ICD-10-CM | POA: Diagnosis not present

## 2016-06-02 DIAGNOSIS — E1122 Type 2 diabetes mellitus with diabetic chronic kidney disease: Secondary | ICD-10-CM | POA: Diagnosis not present

## 2016-06-02 DIAGNOSIS — I130 Hypertensive heart and chronic kidney disease with heart failure and stage 1 through stage 4 chronic kidney disease, or unspecified chronic kidney disease: Secondary | ICD-10-CM | POA: Diagnosis not present

## 2016-06-02 DIAGNOSIS — N183 Chronic kidney disease, stage 3 (moderate): Secondary | ICD-10-CM | POA: Diagnosis not present

## 2016-06-03 DIAGNOSIS — J9611 Chronic respiratory failure with hypoxia: Secondary | ICD-10-CM | POA: Diagnosis not present

## 2016-06-03 DIAGNOSIS — I5032 Chronic diastolic (congestive) heart failure: Secondary | ICD-10-CM | POA: Diagnosis not present

## 2016-06-03 DIAGNOSIS — E1121 Type 2 diabetes mellitus with diabetic nephropathy: Secondary | ICD-10-CM | POA: Diagnosis not present

## 2016-06-03 DIAGNOSIS — E1122 Type 2 diabetes mellitus with diabetic chronic kidney disease: Secondary | ICD-10-CM | POA: Diagnosis not present

## 2016-06-03 DIAGNOSIS — I130 Hypertensive heart and chronic kidney disease with heart failure and stage 1 through stage 4 chronic kidney disease, or unspecified chronic kidney disease: Secondary | ICD-10-CM | POA: Diagnosis not present

## 2016-06-03 DIAGNOSIS — N183 Chronic kidney disease, stage 3 (moderate): Secondary | ICD-10-CM | POA: Diagnosis not present

## 2016-06-04 DIAGNOSIS — E1121 Type 2 diabetes mellitus with diabetic nephropathy: Secondary | ICD-10-CM | POA: Diagnosis not present

## 2016-06-04 DIAGNOSIS — N183 Chronic kidney disease, stage 3 (moderate): Secondary | ICD-10-CM | POA: Diagnosis not present

## 2016-06-04 DIAGNOSIS — E1122 Type 2 diabetes mellitus with diabetic chronic kidney disease: Secondary | ICD-10-CM | POA: Diagnosis not present

## 2016-06-04 DIAGNOSIS — I130 Hypertensive heart and chronic kidney disease with heart failure and stage 1 through stage 4 chronic kidney disease, or unspecified chronic kidney disease: Secondary | ICD-10-CM | POA: Diagnosis not present

## 2016-06-04 DIAGNOSIS — J9611 Chronic respiratory failure with hypoxia: Secondary | ICD-10-CM | POA: Diagnosis not present

## 2016-06-04 DIAGNOSIS — I5032 Chronic diastolic (congestive) heart failure: Secondary | ICD-10-CM | POA: Diagnosis not present

## 2016-06-07 DIAGNOSIS — J9611 Chronic respiratory failure with hypoxia: Secondary | ICD-10-CM | POA: Diagnosis not present

## 2016-06-07 DIAGNOSIS — E1121 Type 2 diabetes mellitus with diabetic nephropathy: Secondary | ICD-10-CM | POA: Diagnosis not present

## 2016-06-07 DIAGNOSIS — E1122 Type 2 diabetes mellitus with diabetic chronic kidney disease: Secondary | ICD-10-CM | POA: Diagnosis not present

## 2016-06-07 DIAGNOSIS — N183 Chronic kidney disease, stage 3 (moderate): Secondary | ICD-10-CM | POA: Diagnosis not present

## 2016-06-07 DIAGNOSIS — I130 Hypertensive heart and chronic kidney disease with heart failure and stage 1 through stage 4 chronic kidney disease, or unspecified chronic kidney disease: Secondary | ICD-10-CM | POA: Diagnosis not present

## 2016-06-07 DIAGNOSIS — I5032 Chronic diastolic (congestive) heart failure: Secondary | ICD-10-CM | POA: Diagnosis not present

## 2016-06-08 DIAGNOSIS — E1122 Type 2 diabetes mellitus with diabetic chronic kidney disease: Secondary | ICD-10-CM | POA: Diagnosis not present

## 2016-06-08 DIAGNOSIS — I130 Hypertensive heart and chronic kidney disease with heart failure and stage 1 through stage 4 chronic kidney disease, or unspecified chronic kidney disease: Secondary | ICD-10-CM | POA: Diagnosis not present

## 2016-06-08 DIAGNOSIS — N183 Chronic kidney disease, stage 3 (moderate): Secondary | ICD-10-CM | POA: Diagnosis not present

## 2016-06-08 DIAGNOSIS — I5032 Chronic diastolic (congestive) heart failure: Secondary | ICD-10-CM | POA: Diagnosis not present

## 2016-06-08 DIAGNOSIS — J9611 Chronic respiratory failure with hypoxia: Secondary | ICD-10-CM | POA: Diagnosis not present

## 2016-06-08 DIAGNOSIS — E1121 Type 2 diabetes mellitus with diabetic nephropathy: Secondary | ICD-10-CM | POA: Diagnosis not present

## 2016-06-09 ENCOUNTER — Ambulatory Visit: Payer: Medicare Other | Admitting: Internal Medicine

## 2016-06-09 DIAGNOSIS — I5032 Chronic diastolic (congestive) heart failure: Secondary | ICD-10-CM | POA: Diagnosis not present

## 2016-06-09 DIAGNOSIS — J9611 Chronic respiratory failure with hypoxia: Secondary | ICD-10-CM | POA: Diagnosis not present

## 2016-06-09 DIAGNOSIS — E1121 Type 2 diabetes mellitus with diabetic nephropathy: Secondary | ICD-10-CM | POA: Diagnosis not present

## 2016-06-09 DIAGNOSIS — E1122 Type 2 diabetes mellitus with diabetic chronic kidney disease: Secondary | ICD-10-CM | POA: Diagnosis not present

## 2016-06-09 DIAGNOSIS — I130 Hypertensive heart and chronic kidney disease with heart failure and stage 1 through stage 4 chronic kidney disease, or unspecified chronic kidney disease: Secondary | ICD-10-CM | POA: Diagnosis not present

## 2016-06-09 DIAGNOSIS — N183 Chronic kidney disease, stage 3 (moderate): Secondary | ICD-10-CM | POA: Diagnosis not present

## 2016-06-10 DIAGNOSIS — J9611 Chronic respiratory failure with hypoxia: Secondary | ICD-10-CM | POA: Diagnosis not present

## 2016-06-10 DIAGNOSIS — N183 Chronic kidney disease, stage 3 (moderate): Secondary | ICD-10-CM | POA: Diagnosis not present

## 2016-06-10 DIAGNOSIS — E1121 Type 2 diabetes mellitus with diabetic nephropathy: Secondary | ICD-10-CM | POA: Diagnosis not present

## 2016-06-10 DIAGNOSIS — E1122 Type 2 diabetes mellitus with diabetic chronic kidney disease: Secondary | ICD-10-CM | POA: Diagnosis not present

## 2016-06-10 DIAGNOSIS — I130 Hypertensive heart and chronic kidney disease with heart failure and stage 1 through stage 4 chronic kidney disease, or unspecified chronic kidney disease: Secondary | ICD-10-CM | POA: Diagnosis not present

## 2016-06-10 DIAGNOSIS — I5032 Chronic diastolic (congestive) heart failure: Secondary | ICD-10-CM | POA: Diagnosis not present

## 2016-06-11 DIAGNOSIS — E1121 Type 2 diabetes mellitus with diabetic nephropathy: Secondary | ICD-10-CM | POA: Diagnosis not present

## 2016-06-11 DIAGNOSIS — N183 Chronic kidney disease, stage 3 (moderate): Secondary | ICD-10-CM | POA: Diagnosis not present

## 2016-06-11 DIAGNOSIS — J9611 Chronic respiratory failure with hypoxia: Secondary | ICD-10-CM | POA: Diagnosis not present

## 2016-06-11 DIAGNOSIS — I130 Hypertensive heart and chronic kidney disease with heart failure and stage 1 through stage 4 chronic kidney disease, or unspecified chronic kidney disease: Secondary | ICD-10-CM | POA: Diagnosis not present

## 2016-06-11 DIAGNOSIS — I5032 Chronic diastolic (congestive) heart failure: Secondary | ICD-10-CM | POA: Diagnosis not present

## 2016-06-11 DIAGNOSIS — E1122 Type 2 diabetes mellitus with diabetic chronic kidney disease: Secondary | ICD-10-CM | POA: Diagnosis not present

## 2016-06-14 DIAGNOSIS — I5032 Chronic diastolic (congestive) heart failure: Secondary | ICD-10-CM | POA: Diagnosis not present

## 2016-06-14 DIAGNOSIS — E1121 Type 2 diabetes mellitus with diabetic nephropathy: Secondary | ICD-10-CM | POA: Diagnosis not present

## 2016-06-14 DIAGNOSIS — N183 Chronic kidney disease, stage 3 (moderate): Secondary | ICD-10-CM | POA: Diagnosis not present

## 2016-06-14 DIAGNOSIS — I130 Hypertensive heart and chronic kidney disease with heart failure and stage 1 through stage 4 chronic kidney disease, or unspecified chronic kidney disease: Secondary | ICD-10-CM | POA: Diagnosis not present

## 2016-06-14 DIAGNOSIS — J9611 Chronic respiratory failure with hypoxia: Secondary | ICD-10-CM | POA: Diagnosis not present

## 2016-06-14 DIAGNOSIS — E1122 Type 2 diabetes mellitus with diabetic chronic kidney disease: Secondary | ICD-10-CM | POA: Diagnosis not present

## 2016-06-15 DIAGNOSIS — E1121 Type 2 diabetes mellitus with diabetic nephropathy: Secondary | ICD-10-CM | POA: Diagnosis not present

## 2016-06-15 DIAGNOSIS — I5032 Chronic diastolic (congestive) heart failure: Secondary | ICD-10-CM | POA: Diagnosis not present

## 2016-06-15 DIAGNOSIS — N183 Chronic kidney disease, stage 3 (moderate): Secondary | ICD-10-CM | POA: Diagnosis not present

## 2016-06-15 DIAGNOSIS — J9611 Chronic respiratory failure with hypoxia: Secondary | ICD-10-CM | POA: Diagnosis not present

## 2016-06-15 DIAGNOSIS — I130 Hypertensive heart and chronic kidney disease with heart failure and stage 1 through stage 4 chronic kidney disease, or unspecified chronic kidney disease: Secondary | ICD-10-CM | POA: Diagnosis not present

## 2016-06-15 DIAGNOSIS — E1122 Type 2 diabetes mellitus with diabetic chronic kidney disease: Secondary | ICD-10-CM | POA: Diagnosis not present

## 2016-06-16 DIAGNOSIS — E1121 Type 2 diabetes mellitus with diabetic nephropathy: Secondary | ICD-10-CM | POA: Diagnosis not present

## 2016-06-16 DIAGNOSIS — N183 Chronic kidney disease, stage 3 (moderate): Secondary | ICD-10-CM | POA: Diagnosis not present

## 2016-06-16 DIAGNOSIS — I5032 Chronic diastolic (congestive) heart failure: Secondary | ICD-10-CM | POA: Diagnosis not present

## 2016-06-16 DIAGNOSIS — I130 Hypertensive heart and chronic kidney disease with heart failure and stage 1 through stage 4 chronic kidney disease, or unspecified chronic kidney disease: Secondary | ICD-10-CM | POA: Diagnosis not present

## 2016-06-16 DIAGNOSIS — J9611 Chronic respiratory failure with hypoxia: Secondary | ICD-10-CM | POA: Diagnosis not present

## 2016-06-16 DIAGNOSIS — E1122 Type 2 diabetes mellitus with diabetic chronic kidney disease: Secondary | ICD-10-CM | POA: Diagnosis not present

## 2016-06-17 DIAGNOSIS — I130 Hypertensive heart and chronic kidney disease with heart failure and stage 1 through stage 4 chronic kidney disease, or unspecified chronic kidney disease: Secondary | ICD-10-CM | POA: Diagnosis not present

## 2016-06-17 DIAGNOSIS — E1122 Type 2 diabetes mellitus with diabetic chronic kidney disease: Secondary | ICD-10-CM | POA: Diagnosis not present

## 2016-06-17 DIAGNOSIS — J9611 Chronic respiratory failure with hypoxia: Secondary | ICD-10-CM | POA: Diagnosis not present

## 2016-06-17 DIAGNOSIS — I5032 Chronic diastolic (congestive) heart failure: Secondary | ICD-10-CM | POA: Diagnosis not present

## 2016-06-17 DIAGNOSIS — E1121 Type 2 diabetes mellitus with diabetic nephropathy: Secondary | ICD-10-CM | POA: Diagnosis not present

## 2016-06-17 DIAGNOSIS — N183 Chronic kidney disease, stage 3 (moderate): Secondary | ICD-10-CM | POA: Diagnosis not present

## 2016-06-18 DIAGNOSIS — N183 Chronic kidney disease, stage 3 (moderate): Secondary | ICD-10-CM | POA: Diagnosis not present

## 2016-06-18 DIAGNOSIS — I5032 Chronic diastolic (congestive) heart failure: Secondary | ICD-10-CM | POA: Diagnosis not present

## 2016-06-18 DIAGNOSIS — E1121 Type 2 diabetes mellitus with diabetic nephropathy: Secondary | ICD-10-CM | POA: Diagnosis not present

## 2016-06-18 DIAGNOSIS — I130 Hypertensive heart and chronic kidney disease with heart failure and stage 1 through stage 4 chronic kidney disease, or unspecified chronic kidney disease: Secondary | ICD-10-CM | POA: Diagnosis not present

## 2016-06-18 DIAGNOSIS — J9611 Chronic respiratory failure with hypoxia: Secondary | ICD-10-CM | POA: Diagnosis not present

## 2016-06-18 DIAGNOSIS — E1122 Type 2 diabetes mellitus with diabetic chronic kidney disease: Secondary | ICD-10-CM | POA: Diagnosis not present

## 2016-06-19 DIAGNOSIS — E1121 Type 2 diabetes mellitus with diabetic nephropathy: Secondary | ICD-10-CM | POA: Diagnosis not present

## 2016-06-19 DIAGNOSIS — J9611 Chronic respiratory failure with hypoxia: Secondary | ICD-10-CM | POA: Diagnosis not present

## 2016-06-19 DIAGNOSIS — E1122 Type 2 diabetes mellitus with diabetic chronic kidney disease: Secondary | ICD-10-CM | POA: Diagnosis not present

## 2016-06-19 DIAGNOSIS — I5032 Chronic diastolic (congestive) heart failure: Secondary | ICD-10-CM | POA: Diagnosis not present

## 2016-06-19 DIAGNOSIS — I130 Hypertensive heart and chronic kidney disease with heart failure and stage 1 through stage 4 chronic kidney disease, or unspecified chronic kidney disease: Secondary | ICD-10-CM | POA: Diagnosis not present

## 2016-06-19 DIAGNOSIS — N183 Chronic kidney disease, stage 3 (moderate): Secondary | ICD-10-CM | POA: Diagnosis not present

## 2016-06-21 DIAGNOSIS — I5032 Chronic diastolic (congestive) heart failure: Secondary | ICD-10-CM | POA: Diagnosis not present

## 2016-06-21 DIAGNOSIS — J9611 Chronic respiratory failure with hypoxia: Secondary | ICD-10-CM | POA: Diagnosis not present

## 2016-06-21 DIAGNOSIS — E1122 Type 2 diabetes mellitus with diabetic chronic kidney disease: Secondary | ICD-10-CM | POA: Diagnosis not present

## 2016-06-21 DIAGNOSIS — E1121 Type 2 diabetes mellitus with diabetic nephropathy: Secondary | ICD-10-CM | POA: Diagnosis not present

## 2016-06-21 DIAGNOSIS — I130 Hypertensive heart and chronic kidney disease with heart failure and stage 1 through stage 4 chronic kidney disease, or unspecified chronic kidney disease: Secondary | ICD-10-CM | POA: Diagnosis not present

## 2016-06-21 DIAGNOSIS — N183 Chronic kidney disease, stage 3 (moderate): Secondary | ICD-10-CM | POA: Diagnosis not present

## 2016-06-22 ENCOUNTER — Other Ambulatory Visit: Payer: Self-pay

## 2016-06-22 DIAGNOSIS — E1121 Type 2 diabetes mellitus with diabetic nephropathy: Secondary | ICD-10-CM | POA: Diagnosis not present

## 2016-06-22 DIAGNOSIS — I5032 Chronic diastolic (congestive) heart failure: Secondary | ICD-10-CM | POA: Diagnosis not present

## 2016-06-22 DIAGNOSIS — E1122 Type 2 diabetes mellitus with diabetic chronic kidney disease: Secondary | ICD-10-CM | POA: Diagnosis not present

## 2016-06-22 DIAGNOSIS — I130 Hypertensive heart and chronic kidney disease with heart failure and stage 1 through stage 4 chronic kidney disease, or unspecified chronic kidney disease: Secondary | ICD-10-CM | POA: Diagnosis not present

## 2016-06-22 DIAGNOSIS — N183 Chronic kidney disease, stage 3 (moderate): Secondary | ICD-10-CM | POA: Diagnosis not present

## 2016-06-22 DIAGNOSIS — J9611 Chronic respiratory failure with hypoxia: Secondary | ICD-10-CM | POA: Diagnosis not present

## 2016-06-22 NOTE — Telephone Encounter (Signed)
Christine Blanchard left v/m requesting hard script sent to [facility] for Tramadol. [name] request cb when ready for pick up or mailing, Last refilled # 60 on 05/25/16. Last seen 04/20/16.

## 2016-06-23 DIAGNOSIS — I5032 Chronic diastolic (congestive) heart failure: Secondary | ICD-10-CM | POA: Diagnosis not present

## 2016-06-23 DIAGNOSIS — I130 Hypertensive heart and chronic kidney disease with heart failure and stage 1 through stage 4 chronic kidney disease, or unspecified chronic kidney disease: Secondary | ICD-10-CM | POA: Diagnosis not present

## 2016-06-23 DIAGNOSIS — E1122 Type 2 diabetes mellitus with diabetic chronic kidney disease: Secondary | ICD-10-CM | POA: Diagnosis not present

## 2016-06-23 DIAGNOSIS — N183 Chronic kidney disease, stage 3 (moderate): Secondary | ICD-10-CM | POA: Diagnosis not present

## 2016-06-23 DIAGNOSIS — J9611 Chronic respiratory failure with hypoxia: Secondary | ICD-10-CM | POA: Diagnosis not present

## 2016-06-23 DIAGNOSIS — E1121 Type 2 diabetes mellitus with diabetic nephropathy: Secondary | ICD-10-CM | POA: Diagnosis not present

## 2016-06-23 MED ORDER — TRAMADOL HCL 50 MG PO TABS: ORAL_TABLET | ORAL | 3 refills | Status: DC

## 2016-06-23 NOTE — Telephone Encounter (Signed)
[facility]

## 2016-06-23 NOTE — Telephone Encounter (Signed)
Printed and in CMA box 

## 2016-06-24 DIAGNOSIS — E1122 Type 2 diabetes mellitus with diabetic chronic kidney disease: Secondary | ICD-10-CM | POA: Diagnosis not present

## 2016-06-24 DIAGNOSIS — I5032 Chronic diastolic (congestive) heart failure: Secondary | ICD-10-CM | POA: Diagnosis not present

## 2016-06-24 DIAGNOSIS — I130 Hypertensive heart and chronic kidney disease with heart failure and stage 1 through stage 4 chronic kidney disease, or unspecified chronic kidney disease: Secondary | ICD-10-CM | POA: Diagnosis not present

## 2016-06-24 DIAGNOSIS — N183 Chronic kidney disease, stage 3 (moderate): Secondary | ICD-10-CM | POA: Diagnosis not present

## 2016-06-24 DIAGNOSIS — E1121 Type 2 diabetes mellitus with diabetic nephropathy: Secondary | ICD-10-CM | POA: Diagnosis not present

## 2016-06-24 DIAGNOSIS — J9611 Chronic respiratory failure with hypoxia: Secondary | ICD-10-CM | POA: Diagnosis not present

## 2016-06-25 DIAGNOSIS — I5032 Chronic diastolic (congestive) heart failure: Secondary | ICD-10-CM | POA: Diagnosis not present

## 2016-06-25 DIAGNOSIS — I130 Hypertensive heart and chronic kidney disease with heart failure and stage 1 through stage 4 chronic kidney disease, or unspecified chronic kidney disease: Secondary | ICD-10-CM | POA: Diagnosis not present

## 2016-06-25 DIAGNOSIS — E1122 Type 2 diabetes mellitus with diabetic chronic kidney disease: Secondary | ICD-10-CM | POA: Diagnosis not present

## 2016-06-25 DIAGNOSIS — J9611 Chronic respiratory failure with hypoxia: Secondary | ICD-10-CM | POA: Diagnosis not present

## 2016-06-25 DIAGNOSIS — E1121 Type 2 diabetes mellitus with diabetic nephropathy: Secondary | ICD-10-CM | POA: Diagnosis not present

## 2016-06-25 DIAGNOSIS — N183 Chronic kidney disease, stage 3 (moderate): Secondary | ICD-10-CM | POA: Diagnosis not present

## 2016-06-28 DIAGNOSIS — N183 Chronic kidney disease, stage 3 (moderate): Secondary | ICD-10-CM | POA: Diagnosis not present

## 2016-06-28 DIAGNOSIS — E1122 Type 2 diabetes mellitus with diabetic chronic kidney disease: Secondary | ICD-10-CM | POA: Diagnosis not present

## 2016-06-28 DIAGNOSIS — E1121 Type 2 diabetes mellitus with diabetic nephropathy: Secondary | ICD-10-CM | POA: Diagnosis not present

## 2016-06-28 DIAGNOSIS — I130 Hypertensive heart and chronic kidney disease with heart failure and stage 1 through stage 4 chronic kidney disease, or unspecified chronic kidney disease: Secondary | ICD-10-CM | POA: Diagnosis not present

## 2016-06-28 DIAGNOSIS — J9611 Chronic respiratory failure with hypoxia: Secondary | ICD-10-CM | POA: Diagnosis not present

## 2016-06-28 DIAGNOSIS — I5032 Chronic diastolic (congestive) heart failure: Secondary | ICD-10-CM | POA: Diagnosis not present

## 2016-06-29 DIAGNOSIS — I5032 Chronic diastolic (congestive) heart failure: Secondary | ICD-10-CM | POA: Diagnosis not present

## 2016-06-29 DIAGNOSIS — J9611 Chronic respiratory failure with hypoxia: Secondary | ICD-10-CM | POA: Diagnosis not present

## 2016-06-29 DIAGNOSIS — E1122 Type 2 diabetes mellitus with diabetic chronic kidney disease: Secondary | ICD-10-CM | POA: Diagnosis not present

## 2016-06-29 DIAGNOSIS — N183 Chronic kidney disease, stage 3 (moderate): Secondary | ICD-10-CM | POA: Diagnosis not present

## 2016-06-29 DIAGNOSIS — E1121 Type 2 diabetes mellitus with diabetic nephropathy: Secondary | ICD-10-CM | POA: Diagnosis not present

## 2016-06-29 DIAGNOSIS — I130 Hypertensive heart and chronic kidney disease with heart failure and stage 1 through stage 4 chronic kidney disease, or unspecified chronic kidney disease: Secondary | ICD-10-CM | POA: Diagnosis not present

## 2016-06-30 DIAGNOSIS — N183 Chronic kidney disease, stage 3 (moderate): Secondary | ICD-10-CM | POA: Diagnosis not present

## 2016-06-30 DIAGNOSIS — J9611 Chronic respiratory failure with hypoxia: Secondary | ICD-10-CM | POA: Diagnosis not present

## 2016-06-30 DIAGNOSIS — I5032 Chronic diastolic (congestive) heart failure: Secondary | ICD-10-CM | POA: Diagnosis not present

## 2016-06-30 DIAGNOSIS — E1121 Type 2 diabetes mellitus with diabetic nephropathy: Secondary | ICD-10-CM | POA: Diagnosis not present

## 2016-06-30 DIAGNOSIS — E1122 Type 2 diabetes mellitus with diabetic chronic kidney disease: Secondary | ICD-10-CM | POA: Diagnosis not present

## 2016-06-30 DIAGNOSIS — I130 Hypertensive heart and chronic kidney disease with heart failure and stage 1 through stage 4 chronic kidney disease, or unspecified chronic kidney disease: Secondary | ICD-10-CM | POA: Diagnosis not present

## 2016-07-01 DIAGNOSIS — I130 Hypertensive heart and chronic kidney disease with heart failure and stage 1 through stage 4 chronic kidney disease, or unspecified chronic kidney disease: Secondary | ICD-10-CM | POA: Diagnosis not present

## 2016-07-01 DIAGNOSIS — J9611 Chronic respiratory failure with hypoxia: Secondary | ICD-10-CM | POA: Diagnosis not present

## 2016-07-01 DIAGNOSIS — E1121 Type 2 diabetes mellitus with diabetic nephropathy: Secondary | ICD-10-CM | POA: Diagnosis not present

## 2016-07-01 DIAGNOSIS — E1122 Type 2 diabetes mellitus with diabetic chronic kidney disease: Secondary | ICD-10-CM | POA: Diagnosis not present

## 2016-07-01 DIAGNOSIS — N183 Chronic kidney disease, stage 3 (moderate): Secondary | ICD-10-CM | POA: Diagnosis not present

## 2016-07-01 DIAGNOSIS — I5032 Chronic diastolic (congestive) heart failure: Secondary | ICD-10-CM | POA: Diagnosis not present

## 2016-07-02 DIAGNOSIS — Z7901 Long term (current) use of anticoagulants: Secondary | ICD-10-CM | POA: Diagnosis not present

## 2016-07-02 DIAGNOSIS — S61411D Laceration without foreign body of right hand, subsequent encounter: Secondary | ICD-10-CM | POA: Diagnosis not present

## 2016-07-02 DIAGNOSIS — I130 Hypertensive heart and chronic kidney disease with heart failure and stage 1 through stage 4 chronic kidney disease, or unspecified chronic kidney disease: Secondary | ICD-10-CM | POA: Diagnosis not present

## 2016-07-02 DIAGNOSIS — Z9181 History of falling: Secondary | ICD-10-CM | POA: Diagnosis not present

## 2016-07-02 DIAGNOSIS — R531 Weakness: Secondary | ICD-10-CM | POA: Diagnosis not present

## 2016-07-02 DIAGNOSIS — E1121 Type 2 diabetes mellitus with diabetic nephropathy: Secondary | ICD-10-CM | POA: Diagnosis not present

## 2016-07-02 DIAGNOSIS — E1122 Type 2 diabetes mellitus with diabetic chronic kidney disease: Secondary | ICD-10-CM | POA: Diagnosis not present

## 2016-07-02 DIAGNOSIS — S90416D Abrasion, unspecified lesser toe(s), subsequent encounter: Secondary | ICD-10-CM | POA: Diagnosis not present

## 2016-07-02 DIAGNOSIS — Z9981 Dependence on supplemental oxygen: Secondary | ICD-10-CM | POA: Diagnosis not present

## 2016-07-02 DIAGNOSIS — N183 Chronic kidney disease, stage 3 (moderate): Secondary | ICD-10-CM | POA: Diagnosis not present

## 2016-07-02 DIAGNOSIS — Z7902 Long term (current) use of antithrombotics/antiplatelets: Secondary | ICD-10-CM | POA: Diagnosis not present

## 2016-07-02 DIAGNOSIS — S51811D Laceration without foreign body of right forearm, subsequent encounter: Secondary | ICD-10-CM | POA: Diagnosis not present

## 2016-07-02 DIAGNOSIS — Z7984 Long term (current) use of oral hypoglycemic drugs: Secondary | ICD-10-CM | POA: Diagnosis not present

## 2016-07-02 DIAGNOSIS — E785 Hyperlipidemia, unspecified: Secondary | ICD-10-CM | POA: Diagnosis not present

## 2016-07-02 DIAGNOSIS — I25708 Atherosclerosis of coronary artery bypass graft(s), unspecified, with other forms of angina pectoris: Secondary | ICD-10-CM | POA: Diagnosis not present

## 2016-07-02 DIAGNOSIS — J9611 Chronic respiratory failure with hypoxia: Secondary | ICD-10-CM | POA: Diagnosis not present

## 2016-07-02 DIAGNOSIS — I5032 Chronic diastolic (congestive) heart failure: Secondary | ICD-10-CM | POA: Diagnosis not present

## 2016-07-05 DIAGNOSIS — I5032 Chronic diastolic (congestive) heart failure: Secondary | ICD-10-CM | POA: Diagnosis not present

## 2016-07-05 DIAGNOSIS — N183 Chronic kidney disease, stage 3 (moderate): Secondary | ICD-10-CM | POA: Diagnosis not present

## 2016-07-05 DIAGNOSIS — I130 Hypertensive heart and chronic kidney disease with heart failure and stage 1 through stage 4 chronic kidney disease, or unspecified chronic kidney disease: Secondary | ICD-10-CM | POA: Diagnosis not present

## 2016-07-05 DIAGNOSIS — J9611 Chronic respiratory failure with hypoxia: Secondary | ICD-10-CM | POA: Diagnosis not present

## 2016-07-05 DIAGNOSIS — E1122 Type 2 diabetes mellitus with diabetic chronic kidney disease: Secondary | ICD-10-CM | POA: Diagnosis not present

## 2016-07-05 DIAGNOSIS — E1121 Type 2 diabetes mellitus with diabetic nephropathy: Secondary | ICD-10-CM | POA: Diagnosis not present

## 2016-07-06 DIAGNOSIS — J9611 Chronic respiratory failure with hypoxia: Secondary | ICD-10-CM | POA: Diagnosis not present

## 2016-07-06 DIAGNOSIS — E1121 Type 2 diabetes mellitus with diabetic nephropathy: Secondary | ICD-10-CM | POA: Diagnosis not present

## 2016-07-06 DIAGNOSIS — E1122 Type 2 diabetes mellitus with diabetic chronic kidney disease: Secondary | ICD-10-CM | POA: Diagnosis not present

## 2016-07-06 DIAGNOSIS — N183 Chronic kidney disease, stage 3 (moderate): Secondary | ICD-10-CM | POA: Diagnosis not present

## 2016-07-06 DIAGNOSIS — I5032 Chronic diastolic (congestive) heart failure: Secondary | ICD-10-CM | POA: Diagnosis not present

## 2016-07-06 DIAGNOSIS — I130 Hypertensive heart and chronic kidney disease with heart failure and stage 1 through stage 4 chronic kidney disease, or unspecified chronic kidney disease: Secondary | ICD-10-CM | POA: Diagnosis not present

## 2016-07-07 DIAGNOSIS — J9611 Chronic respiratory failure with hypoxia: Secondary | ICD-10-CM | POA: Diagnosis not present

## 2016-07-07 DIAGNOSIS — I5032 Chronic diastolic (congestive) heart failure: Secondary | ICD-10-CM | POA: Diagnosis not present

## 2016-07-07 DIAGNOSIS — N183 Chronic kidney disease, stage 3 (moderate): Secondary | ICD-10-CM | POA: Diagnosis not present

## 2016-07-07 DIAGNOSIS — I130 Hypertensive heart and chronic kidney disease with heart failure and stage 1 through stage 4 chronic kidney disease, or unspecified chronic kidney disease: Secondary | ICD-10-CM | POA: Diagnosis not present

## 2016-07-07 DIAGNOSIS — E1122 Type 2 diabetes mellitus with diabetic chronic kidney disease: Secondary | ICD-10-CM | POA: Diagnosis not present

## 2016-07-07 DIAGNOSIS — E1121 Type 2 diabetes mellitus with diabetic nephropathy: Secondary | ICD-10-CM | POA: Diagnosis not present

## 2016-07-08 DIAGNOSIS — E1121 Type 2 diabetes mellitus with diabetic nephropathy: Secondary | ICD-10-CM | POA: Diagnosis not present

## 2016-07-08 DIAGNOSIS — I130 Hypertensive heart and chronic kidney disease with heart failure and stage 1 through stage 4 chronic kidney disease, or unspecified chronic kidney disease: Secondary | ICD-10-CM | POA: Diagnosis not present

## 2016-07-08 DIAGNOSIS — N183 Chronic kidney disease, stage 3 (moderate): Secondary | ICD-10-CM | POA: Diagnosis not present

## 2016-07-08 DIAGNOSIS — E1122 Type 2 diabetes mellitus with diabetic chronic kidney disease: Secondary | ICD-10-CM | POA: Diagnosis not present

## 2016-07-08 DIAGNOSIS — J9611 Chronic respiratory failure with hypoxia: Secondary | ICD-10-CM | POA: Diagnosis not present

## 2016-07-08 DIAGNOSIS — I5032 Chronic diastolic (congestive) heart failure: Secondary | ICD-10-CM | POA: Diagnosis not present

## 2016-07-09 DIAGNOSIS — I130 Hypertensive heart and chronic kidney disease with heart failure and stage 1 through stage 4 chronic kidney disease, or unspecified chronic kidney disease: Secondary | ICD-10-CM | POA: Diagnosis not present

## 2016-07-09 DIAGNOSIS — N183 Chronic kidney disease, stage 3 (moderate): Secondary | ICD-10-CM | POA: Diagnosis not present

## 2016-07-09 DIAGNOSIS — J9611 Chronic respiratory failure with hypoxia: Secondary | ICD-10-CM | POA: Diagnosis not present

## 2016-07-09 DIAGNOSIS — E1122 Type 2 diabetes mellitus with diabetic chronic kidney disease: Secondary | ICD-10-CM | POA: Diagnosis not present

## 2016-07-09 DIAGNOSIS — I5032 Chronic diastolic (congestive) heart failure: Secondary | ICD-10-CM | POA: Diagnosis not present

## 2016-07-09 DIAGNOSIS — E1121 Type 2 diabetes mellitus with diabetic nephropathy: Secondary | ICD-10-CM | POA: Diagnosis not present

## 2016-07-12 DIAGNOSIS — N183 Chronic kidney disease, stage 3 (moderate): Secondary | ICD-10-CM | POA: Diagnosis not present

## 2016-07-12 DIAGNOSIS — J9611 Chronic respiratory failure with hypoxia: Secondary | ICD-10-CM | POA: Diagnosis not present

## 2016-07-12 DIAGNOSIS — E1122 Type 2 diabetes mellitus with diabetic chronic kidney disease: Secondary | ICD-10-CM | POA: Diagnosis not present

## 2016-07-12 DIAGNOSIS — E1121 Type 2 diabetes mellitus with diabetic nephropathy: Secondary | ICD-10-CM | POA: Diagnosis not present

## 2016-07-12 DIAGNOSIS — I130 Hypertensive heart and chronic kidney disease with heart failure and stage 1 through stage 4 chronic kidney disease, or unspecified chronic kidney disease: Secondary | ICD-10-CM | POA: Diagnosis not present

## 2016-07-12 DIAGNOSIS — I5032 Chronic diastolic (congestive) heart failure: Secondary | ICD-10-CM | POA: Diagnosis not present

## 2016-07-13 DIAGNOSIS — J9611 Chronic respiratory failure with hypoxia: Secondary | ICD-10-CM | POA: Diagnosis not present

## 2016-07-13 DIAGNOSIS — I5032 Chronic diastolic (congestive) heart failure: Secondary | ICD-10-CM | POA: Diagnosis not present

## 2016-07-13 DIAGNOSIS — E1121 Type 2 diabetes mellitus with diabetic nephropathy: Secondary | ICD-10-CM | POA: Diagnosis not present

## 2016-07-13 DIAGNOSIS — N183 Chronic kidney disease, stage 3 (moderate): Secondary | ICD-10-CM | POA: Diagnosis not present

## 2016-07-13 DIAGNOSIS — E1122 Type 2 diabetes mellitus with diabetic chronic kidney disease: Secondary | ICD-10-CM | POA: Diagnosis not present

## 2016-07-13 DIAGNOSIS — I130 Hypertensive heart and chronic kidney disease with heart failure and stage 1 through stage 4 chronic kidney disease, or unspecified chronic kidney disease: Secondary | ICD-10-CM | POA: Diagnosis not present

## 2016-07-14 DIAGNOSIS — J9611 Chronic respiratory failure with hypoxia: Secondary | ICD-10-CM | POA: Diagnosis not present

## 2016-07-14 DIAGNOSIS — E1122 Type 2 diabetes mellitus with diabetic chronic kidney disease: Secondary | ICD-10-CM | POA: Diagnosis not present

## 2016-07-14 DIAGNOSIS — E1121 Type 2 diabetes mellitus with diabetic nephropathy: Secondary | ICD-10-CM | POA: Diagnosis not present

## 2016-07-14 DIAGNOSIS — I130 Hypertensive heart and chronic kidney disease with heart failure and stage 1 through stage 4 chronic kidney disease, or unspecified chronic kidney disease: Secondary | ICD-10-CM | POA: Diagnosis not present

## 2016-07-14 DIAGNOSIS — N183 Chronic kidney disease, stage 3 (moderate): Secondary | ICD-10-CM | POA: Diagnosis not present

## 2016-07-14 DIAGNOSIS — I5032 Chronic diastolic (congestive) heart failure: Secondary | ICD-10-CM | POA: Diagnosis not present

## 2016-07-15 DIAGNOSIS — E1122 Type 2 diabetes mellitus with diabetic chronic kidney disease: Secondary | ICD-10-CM | POA: Diagnosis not present

## 2016-07-15 DIAGNOSIS — N183 Chronic kidney disease, stage 3 (moderate): Secondary | ICD-10-CM | POA: Diagnosis not present

## 2016-07-15 DIAGNOSIS — E1121 Type 2 diabetes mellitus with diabetic nephropathy: Secondary | ICD-10-CM | POA: Diagnosis not present

## 2016-07-15 DIAGNOSIS — I5032 Chronic diastolic (congestive) heart failure: Secondary | ICD-10-CM | POA: Diagnosis not present

## 2016-07-15 DIAGNOSIS — J9611 Chronic respiratory failure with hypoxia: Secondary | ICD-10-CM | POA: Diagnosis not present

## 2016-07-15 DIAGNOSIS — I130 Hypertensive heart and chronic kidney disease with heart failure and stage 1 through stage 4 chronic kidney disease, or unspecified chronic kidney disease: Secondary | ICD-10-CM | POA: Diagnosis not present

## 2016-07-16 ENCOUNTER — Other Ambulatory Visit: Payer: Self-pay

## 2016-07-16 DIAGNOSIS — E1121 Type 2 diabetes mellitus with diabetic nephropathy: Secondary | ICD-10-CM | POA: Diagnosis not present

## 2016-07-16 DIAGNOSIS — E1122 Type 2 diabetes mellitus with diabetic chronic kidney disease: Secondary | ICD-10-CM | POA: Diagnosis not present

## 2016-07-16 DIAGNOSIS — N183 Chronic kidney disease, stage 3 (moderate): Secondary | ICD-10-CM | POA: Diagnosis not present

## 2016-07-16 DIAGNOSIS — I130 Hypertensive heart and chronic kidney disease with heart failure and stage 1 through stage 4 chronic kidney disease, or unspecified chronic kidney disease: Secondary | ICD-10-CM | POA: Diagnosis not present

## 2016-07-16 DIAGNOSIS — J9611 Chronic respiratory failure with hypoxia: Secondary | ICD-10-CM | POA: Diagnosis not present

## 2016-07-16 DIAGNOSIS — I5032 Chronic diastolic (congestive) heart failure: Secondary | ICD-10-CM | POA: Diagnosis not present

## 2016-07-16 MED ORDER — LORAZEPAM 1 MG PO TABS: 1.0000 mg | ORAL_TABLET | Freq: Every day | ORAL | 1 refills | Status: DC

## 2016-07-16 NOTE — Telephone Encounter (Signed)
Christine Blanchard with Christine Blanchard request hard copy of lorazepam 1 mg faxed to [facility]; [facility] Fax # [phone].Christine Blanchard needs for their records) Also request [facility]. Last refilled # 90 x 1 on 03/02/16. Please advise. Pt last seen 04/20/16.

## 2016-07-16 NOTE — Telephone Encounter (Signed)
Printed and in CMA box 

## 2016-07-19 ENCOUNTER — Other Ambulatory Visit: Payer: Self-pay | Admitting: Family Medicine

## 2016-07-19 DIAGNOSIS — J9611 Chronic respiratory failure with hypoxia: Secondary | ICD-10-CM | POA: Diagnosis not present

## 2016-07-19 DIAGNOSIS — N183 Chronic kidney disease, stage 3 (moderate): Secondary | ICD-10-CM | POA: Diagnosis not present

## 2016-07-19 DIAGNOSIS — E1122 Type 2 diabetes mellitus with diabetic chronic kidney disease: Secondary | ICD-10-CM | POA: Diagnosis not present

## 2016-07-19 DIAGNOSIS — I130 Hypertensive heart and chronic kidney disease with heart failure and stage 1 through stage 4 chronic kidney disease, or unspecified chronic kidney disease: Secondary | ICD-10-CM | POA: Diagnosis not present

## 2016-07-19 DIAGNOSIS — E1121 Type 2 diabetes mellitus with diabetic nephropathy: Secondary | ICD-10-CM | POA: Diagnosis not present

## 2016-07-19 DIAGNOSIS — I5032 Chronic diastolic (congestive) heart failure: Secondary | ICD-10-CM | POA: Diagnosis not present

## 2016-07-20 DIAGNOSIS — N183 Chronic kidney disease, stage 3 (moderate): Secondary | ICD-10-CM | POA: Diagnosis not present

## 2016-07-20 DIAGNOSIS — I130 Hypertensive heart and chronic kidney disease with heart failure and stage 1 through stage 4 chronic kidney disease, or unspecified chronic kidney disease: Secondary | ICD-10-CM | POA: Diagnosis not present

## 2016-07-20 DIAGNOSIS — E1122 Type 2 diabetes mellitus with diabetic chronic kidney disease: Secondary | ICD-10-CM | POA: Diagnosis not present

## 2016-07-20 DIAGNOSIS — I5032 Chronic diastolic (congestive) heart failure: Secondary | ICD-10-CM | POA: Diagnosis not present

## 2016-07-20 DIAGNOSIS — J9611 Chronic respiratory failure with hypoxia: Secondary | ICD-10-CM | POA: Diagnosis not present

## 2016-07-20 DIAGNOSIS — E1121 Type 2 diabetes mellitus with diabetic nephropathy: Secondary | ICD-10-CM | POA: Diagnosis not present

## 2016-07-21 ENCOUNTER — Encounter: Payer: Self-pay | Admitting: Family Medicine

## 2016-07-21 ENCOUNTER — Ambulatory Visit (INDEPENDENT_AMBULATORY_CARE_PROVIDER_SITE_OTHER): Admitting: Family Medicine

## 2016-07-21 VITALS — BP 102/68 | HR 64 | Temp 97.8°F | Ht 64.5 in | Wt 131.5 lb

## 2016-07-21 DIAGNOSIS — I5032 Chronic diastolic (congestive) heart failure: Secondary | ICD-10-CM

## 2016-07-21 DIAGNOSIS — Z515 Encounter for palliative care: Secondary | ICD-10-CM | POA: Diagnosis not present

## 2016-07-21 DIAGNOSIS — G4700 Insomnia, unspecified: Secondary | ICD-10-CM | POA: Diagnosis not present

## 2016-07-21 DIAGNOSIS — J9611 Chronic respiratory failure with hypoxia: Secondary | ICD-10-CM

## 2016-07-21 DIAGNOSIS — R1084 Generalized abdominal pain: Secondary | ICD-10-CM

## 2016-07-21 DIAGNOSIS — I130 Hypertensive heart and chronic kidney disease with heart failure and stage 1 through stage 4 chronic kidney disease, or unspecified chronic kidney disease: Secondary | ICD-10-CM | POA: Diagnosis not present

## 2016-07-21 DIAGNOSIS — E1121 Type 2 diabetes mellitus with diabetic nephropathy: Secondary | ICD-10-CM | POA: Diagnosis not present

## 2016-07-21 DIAGNOSIS — E1122 Type 2 diabetes mellitus with diabetic chronic kidney disease: Secondary | ICD-10-CM | POA: Diagnosis not present

## 2016-07-21 DIAGNOSIS — N183 Chronic kidney disease, stage 3 (moderate): Secondary | ICD-10-CM | POA: Diagnosis not present

## 2016-07-21 DIAGNOSIS — I25708 Atherosclerosis of coronary artery bypass graft(s), unspecified, with other forms of angina pectoris: Secondary | ICD-10-CM

## 2016-07-21 LAB — COMPREHENSIVE METABOLIC PANEL
ALT: 16 U/L (ref 0–35)
AST: 20 U/L (ref 0–37)
Albumin: 4.2 g/dL (ref 3.5–5.2)
Alkaline Phosphatase: 71 U/L (ref 39–117)
BUN: 18 mg/dL (ref 6–23)
CALCIUM: 9.8 mg/dL (ref 8.4–10.5)
CHLORIDE: 97 meq/L (ref 96–112)
CO2: 36 meq/L — AB (ref 19–32)
CREATININE: 0.81 mg/dL (ref 0.40–1.20)
GFR: 71.23 mL/min (ref >60.00)
GLUCOSE: 144 mg/dL — AB (ref 70–99)
Potassium: 3.7 mEq/L (ref 3.5–5.1)
Sodium: 142 mEq/L (ref 135–145)
TOTAL PROTEIN: 7.7 g/dL (ref 6.0–8.3)
Total Bilirubin: 0.4 mg/dL (ref 0.2–1.2)

## 2016-07-21 LAB — CBC WITH DIFFERENTIAL/PLATELET
BASOS ABS: 0 10*3/uL (ref 0.0–0.1)
Basophils Relative: 0.4 % (ref 0.0–3.0)
Eosinophils Absolute: 0.2 10*3/uL (ref 0.0–0.7)
Eosinophils Relative: 2.7 % (ref 0.0–5.0)
HEMATOCRIT: 37.2 % (ref 36.0–46.0)
Hemoglobin: 11.7 g/dL — ABNORMAL LOW (ref 12.0–15.0)
LYMPHS PCT: 31.9 % (ref 12.0–46.0)
Lymphs Abs: 2.8 10*3/uL (ref 0.7–4.0)
MCHC: 31.5 g/dL (ref 30.0–36.0)
MCV: 81.6 fl (ref 78.0–100.0)
MONOS PCT: 10.6 % (ref 3.0–12.0)
Monocytes Absolute: 0.9 10*3/uL (ref 0.1–1.0)
NEUTROS ABS: 4.7 10*3/uL (ref 1.4–7.7)
Neutrophils Relative %: 54.4 % (ref 43.0–77.0)
Platelets: 203 10*3/uL (ref 150.0–400.0)
RBC: 4.56 Mil/uL (ref 3.87–5.11)
RDW: 17.5 % — ABNORMAL HIGH (ref 11.5–15.5)
WBC: 8.7 10*3/uL (ref 4.0–10.5)

## 2016-07-21 LAB — LIPASE: LIPASE: 31 U/L (ref 11.0–59.0)

## 2016-07-21 LAB — HEMOGLOBIN A1C: Hgb A1c MFr Bld: 7.4 % — ABNORMAL HIGH (ref 4.6–6.5)

## 2016-07-21 MED ORDER — LORAZEPAM 1 MG PO TABS: 1.0000 mg | ORAL_TABLET | Freq: Every day | ORAL | 1 refills | Status: DC

## 2016-07-21 NOTE — Telephone Encounter (Signed)
I don't see documentation of what was done.  Pt in office today with note asking for hard copy to send to nursing home, or to [facility].  Can we encourage [facility]As to document what they do with scripts?? I've re-printed Rx lorazepam #90 and given to patient today.

## 2016-07-21 NOTE — Assessment & Plan Note (Signed)
Endorses generalized abd pain. Advised against increased tramadol for this. Check CBC, CMP, lipase today.

## 2016-07-21 NOTE — Assessment & Plan Note (Signed)
Request from nursing home for hard copy lorazepam script. I had asked to have [facility]ed [facility] 07/16/2016 but I don't see where this was done yet. I printed out hard copy for patient to take to SNF today.

## 2016-07-21 NOTE — Patient Instructions (Addendum)
Lorazepam printed prescription provided today for #90.  Labs today Return in 3 months for follow up visit.

## 2016-07-21 NOTE — Progress Notes (Signed)
BP 102/68   Pulse 64   Temp 97.8 F (36.6 C)   Ht 5' 4.5" (1.638 m)   Wt 131 lb 8 oz (59.6 kg)   SpO2 97% Comment: 2L o2  BMI 22.22 kg/m    CC: 3 mo f/u visit Subjective:    Patient ID: Christine Blanchard, female    DOB: [birth year], 83 y.o.   MRN: [id number]  HPI: Christine Blanchard is a 83 y.o. female presenting on 07/21/2016 for Follow-up   Here with son today.  Hospice patient. "It's different". She appreciates their care.  Weight overall stable.  Requests higher tramadol dose for chronic pain - describes pain in chest and abdomen.  She does take stool softener daily and stools regularly.  Request from nursing home for hard copy lorazepam script.   Relevant past medical, surgical, family and social history reviewed and updated as indicated. Interim medical history since our last visit reviewed. Allergies and medications reviewed and updated. Outpatient Medications Prior to Visit  Medication Sig Dispense Refill  . acetaminophen (TYLENOL) 500 MG tablet Take 500 mg by mouth 3 (three) times daily.    . ASPIRIN LOW DOSE 81 MG chewable tablet TAKE 1 TABLET BY MOUTH ONCE DAILY 30 tablet 6  . atorvastatin (LIPITOR) 20 MG tablet TAKE 1 TABLET NIGHTLY 90 tablet 1  . BD AUTOSHIELD DUO 30G X 5 MM MISC Use as instructed with insulin Dx: E11.21 100 each 3  . cetirizine (ZYRTEC) 10 MG tablet Take 1 tablet (10 mg total) by mouth daily as needed for allergies.    Marland Kitchen clopidogrel (PLAVIX) 75 MG tablet TAKE 1 TABLET DAILY 90 tablet 3  . docusate sodium (COLACE) 100 MG capsule Take 1 capsule (100 mg total) by mouth daily. 90 capsule 1  . glucose blood (FREESTYLE LITE) test strip Use to check sugar three times daily Dx: 250.40 100 each 3  . glucose monitoring kit (FREESTYLE) monitoring kit 1 each by Does not apply route as needed for other. 1 each 0  . isosorbide mononitrate (IMDUR) 30 MG 24 hr tablet TAKE 1 TABLET AT BEDTIME 90 tablet 3  . KLOR-CON M10 10 MEQ tablet TAKE 2 TABLETS (20 MEQ TOTAL) DAILY  180 tablet 1  . Lancets (FREESTYLE) lancets USE TO CHECK SUGAR THREE TIMES A DAY 300 each 2  . LANTUS SOLOSTAR 100 UNIT/ML Solostar Pen INJECT 10 UNITS UNDER THE SKIN DAILY WITH BREAKFAST (DIRECTION CHANGE) 15 mL 6  . magnesium hydroxide (MILK OF MAGNESIA) 800 MG/5ML suspension Take by mouth daily as needed for constipation.    . meclizine (ANTIVERT) 12.5 MG tablet One tablet by mouth daily as needed for vertigo 30 tablet 0  . metFORMIN (GLUCOPHAGE-XR) 500 MG 24 hr tablet Take 1 tablet (500 mg total) by mouth at bedtime. 90 tablet 3  . metoprolol tartrate (LOPRESSOR) 25 MG tablet TAKE 1 TABLET TWICE A DAY 180 tablet 3  . Multiple Vitamin (MULTIVITAMIN) capsule Take 1 capsule by mouth daily.      . nitroGLYCERIN (NITROSTAT) 0.4 MG SL tablet DISSOLVE 1 TABLET UNDER THE TONGUE AS NEEDED 20 tablet 1  . ondansetron (ZOFRAN) 4 MG tablet Take 1 tablet (4 mg total) by mouth 2 (two) times daily as needed for nausea or vomiting. 30 tablet 1  . prednisoLONE sodium phosphate (INFLAMASE FORTE) 1 % ophthalmic solution Place 1 drop into both eyes daily. 5 mL 3  . RABEprazole (ACIPHEX) 20 MG tablet TAKE 1 TABLET DAILY 90 tablet 1  . sertraline (  ZOLOFT) 50 MG tablet Take 1 tablet (50 mg total) by mouth daily. 90 tablet 3  . SPIRIVA HANDIHALER 18 MCG inhalation capsule INHALE THE CONTENTS OF 1 CAPSULE VIA HANDIHALER DAILY 90 capsule 2  . torsemide (DEMADEX) 20 MG tablet TAKE 2 TABLETS EVERY MORNING AND 1 EXTRA TABLET ON MONDAY, WEDNESDAY, AND FRIDAY IF NEEDED 225 tablet 1  . traMADol (ULTRAM) 50 MG tablet TAKE ONE TABLET BY MOUTH 2 TIMES A DAY AT 3PM AND 10PM 60 tablet 3  . traZODone (DESYREL) 50 MG tablet TAKE ONE-HALF (1/2) TO 1 TABLET AT BEDTIME AS NEEDED FOR SLEEP 90 tablet 1  . Vitamin D, Ergocalciferol, (DRISDOL) 50000 units CAPS capsule TAKE 1 CAPSULE EVERY WEEK 12 capsule 1  . LORazepam (ATIVAN) 1 MG tablet Take 1 tablet (1 mg total) by mouth at bedtime. insomnia 90 tablet 1   No facility-administered  medications prior to visit.      Per HPI unless specifically indicated in ROS section below Review of Systems     Objective:    BP 102/68   Pulse 64   Temp 97.8 F (36.6 C)   Ht 5' 4.5" (1.638 m)   Wt 131 lb 8 oz (59.6 kg)   SpO2 97% Comment: 2L o2  BMI 22.22 kg/m   Wt Readings from Last 3 Encounters:  07/21/16 131 lb 8 oz (59.6 kg)  05/24/16 126 lb (57.2 kg)  04/20/16 130 lb 8 oz (59.2 kg)    Physical Exam  Constitutional: She appears well-developed and well-nourished. No distress.  Frail, supplemental O2 by [place] In wheelchair  HENT:  Head: Normocephalic and atraumatic.  Mouth/Throat: Oropharynx is clear and moist. No oropharyngeal exudate.  Cardiovascular: Normal rate, regular rhythm, normal heart sounds and intact distal pulses.   No murmur heard. Pulmonary/Chest: Effort normal and breath sounds normal. No respiratory distress. She has no wheezes. She has no rales.  Coarse, some bibasilar crackles  Abdominal: Soft. Bowel sounds are normal. She exhibits no distension and no mass. There is no tenderness. There is no rebound and no guarding.  Musculoskeletal: She exhibits no edema.  Skin: Skin is warm and dry.  Psychiatric: She has a normal mood and affect.  Nursing note and vitals reviewed.  Results for orders placed or performed in visit on 03/23/16  Hemoglobin A1c  Result Value Ref Range   Hgb A1c MFr Bld 7.2 (H) 4.6 - 6.5 %  Basic metabolic panel  Result Value Ref Range   Sodium 140 135 - 145 mEq/L   Potassium 3.5 3.5 - 5.1 mEq/L   Chloride 96 96 - 112 mEq/L   CO2 39 (H) 19 - 32 mEq/L   Glucose, Bld 143 (H) 70 - 99 mg/dL   BUN 10 6 - 23 mg/dL   Creatinine, Ser 0.74 0.40 - 1.20 mg/dL   Calcium 9.4 8.4 - 10.5 mg/dL   GFR 79.12 >60.00 mL/min      Assessment & Plan:   Problem List Items Addressed This Visit    Abdominal pain - Primary    Endorses generalized abd pain. Advised against increased tramadol for this. Check CBC, CMP, lipase today.        Relevant Orders   CBC with Differential/Platelet   Comprehensive metabolic panel   Lipase   Chronic diastolic CHF (congestive heart failure) ([place])   Chronic hypoxemic respiratory failure [facility])   Hospice care patient   Insomnia    Request from nursing home for hard copy lorazepam script. I had asked to  have [facility] 07/16/2016 but I don't see where this was done yet. I printed out hard copy for patient to take to SNF today.       Well controlled type 2 diabetes mellitus with nephropathy ([place])    Pt endorses trending hyperglycemia. Update A1c. We previously decreased metformin due to nausea - this helped.       Relevant Orders   Hemoglobin A1c       Follow up plan: Return in about 3 months (around 10/21/2016) for follow up visit.  Ria Bush, MD

## 2016-07-21 NOTE — Assessment & Plan Note (Addendum)
Pt endorses trending hyperglycemia. Update A1c. We previously decreased metformin due to nausea - this helped.

## 2016-07-21 NOTE — Telephone Encounter (Signed)
Ativan Hard Copy given to patient

## 2016-07-22 ENCOUNTER — Other Ambulatory Visit: Payer: Self-pay | Admitting: Family Medicine

## 2016-07-22 DIAGNOSIS — J9611 Chronic respiratory failure with hypoxia: Secondary | ICD-10-CM | POA: Diagnosis not present

## 2016-07-22 DIAGNOSIS — E1122 Type 2 diabetes mellitus with diabetic chronic kidney disease: Secondary | ICD-10-CM | POA: Diagnosis not present

## 2016-07-22 DIAGNOSIS — I130 Hypertensive heart and chronic kidney disease with heart failure and stage 1 through stage 4 chronic kidney disease, or unspecified chronic kidney disease: Secondary | ICD-10-CM | POA: Diagnosis not present

## 2016-07-22 DIAGNOSIS — E1121 Type 2 diabetes mellitus with diabetic nephropathy: Secondary | ICD-10-CM | POA: Diagnosis not present

## 2016-07-22 DIAGNOSIS — I5032 Chronic diastolic (congestive) heart failure: Secondary | ICD-10-CM | POA: Diagnosis not present

## 2016-07-22 DIAGNOSIS — N183 Chronic kidney disease, stage 3 (moderate): Secondary | ICD-10-CM | POA: Diagnosis not present

## 2016-07-22 NOTE — Telephone Encounter (Addendum)
Original Hard copy of Lorazepam RX printed on 07/16/16 was placed in the box in front office for patient to sign for and pick up.    I have the original R/X and have shredded it as new R/X was given on 07/21/16 at office appointment.    In addition, I will educate staff on proper communication with patient regarding R/X pick up and proper documentation in chart of what was done with the script.

## 2016-07-23 ENCOUNTER — Other Ambulatory Visit: Payer: Self-pay

## 2016-07-23 DIAGNOSIS — E1121 Type 2 diabetes mellitus with diabetic nephropathy: Secondary | ICD-10-CM | POA: Diagnosis not present

## 2016-07-23 DIAGNOSIS — J9611 Chronic respiratory failure with hypoxia: Secondary | ICD-10-CM | POA: Diagnosis not present

## 2016-07-23 DIAGNOSIS — I5032 Chronic diastolic (congestive) heart failure: Secondary | ICD-10-CM | POA: Diagnosis not present

## 2016-07-23 DIAGNOSIS — E1122 Type 2 diabetes mellitus with diabetic chronic kidney disease: Secondary | ICD-10-CM | POA: Diagnosis not present

## 2016-07-23 DIAGNOSIS — N183 Chronic kidney disease, stage 3 (moderate): Secondary | ICD-10-CM | POA: Diagnosis not present

## 2016-07-23 DIAGNOSIS — I130 Hypertensive heart and chronic kidney disease with heart failure and stage 1 through stage 4 chronic kidney disease, or unspecified chronic kidney disease: Secondary | ICD-10-CM | POA: Diagnosis not present

## 2016-07-23 MED ORDER — FREESTYLE SYSTEM KIT: 1.0000 | PACK | 0 refills | Status: AC | PRN

## 2016-07-23 MED ORDER — CETIRIZINE HCL 10 MG PO TABS: 10.0000 mg | ORAL_TABLET | Freq: Every day | ORAL | 5 refills | Status: AC | PRN

## 2016-07-23 MED ORDER — MECLIZINE HCL 12.5 MG PO TABS: ORAL_TABLET | ORAL | 0 refills | Status: AC

## 2016-07-26 ENCOUNTER — Encounter: Payer: Self-pay | Admitting: Family Medicine

## 2016-07-26 DIAGNOSIS — I130 Hypertensive heart and chronic kidney disease with heart failure and stage 1 through stage 4 chronic kidney disease, or unspecified chronic kidney disease: Secondary | ICD-10-CM | POA: Diagnosis not present

## 2016-07-26 DIAGNOSIS — I5032 Chronic diastolic (congestive) heart failure: Secondary | ICD-10-CM | POA: Diagnosis not present

## 2016-07-26 DIAGNOSIS — E1122 Type 2 diabetes mellitus with diabetic chronic kidney disease: Secondary | ICD-10-CM | POA: Diagnosis not present

## 2016-07-26 DIAGNOSIS — J9611 Chronic respiratory failure with hypoxia: Secondary | ICD-10-CM | POA: Diagnosis not present

## 2016-07-26 DIAGNOSIS — N183 Chronic kidney disease, stage 3 (moderate): Secondary | ICD-10-CM | POA: Diagnosis not present

## 2016-07-26 DIAGNOSIS — E1121 Type 2 diabetes mellitus with diabetic nephropathy: Secondary | ICD-10-CM | POA: Diagnosis not present

## 2016-07-27 DIAGNOSIS — E1121 Type 2 diabetes mellitus with diabetic nephropathy: Secondary | ICD-10-CM | POA: Diagnosis not present

## 2016-07-27 DIAGNOSIS — I130 Hypertensive heart and chronic kidney disease with heart failure and stage 1 through stage 4 chronic kidney disease, or unspecified chronic kidney disease: Secondary | ICD-10-CM | POA: Diagnosis not present

## 2016-07-27 DIAGNOSIS — N183 Chronic kidney disease, stage 3 (moderate): Secondary | ICD-10-CM | POA: Diagnosis not present

## 2016-07-27 DIAGNOSIS — J9611 Chronic respiratory failure with hypoxia: Secondary | ICD-10-CM | POA: Diagnosis not present

## 2016-07-27 DIAGNOSIS — I5032 Chronic diastolic (congestive) heart failure: Secondary | ICD-10-CM | POA: Diagnosis not present

## 2016-07-27 DIAGNOSIS — E1122 Type 2 diabetes mellitus with diabetic chronic kidney disease: Secondary | ICD-10-CM | POA: Diagnosis not present

## 2016-07-28 DIAGNOSIS — I5032 Chronic diastolic (congestive) heart failure: Secondary | ICD-10-CM | POA: Diagnosis not present

## 2016-07-28 DIAGNOSIS — E1121 Type 2 diabetes mellitus with diabetic nephropathy: Secondary | ICD-10-CM | POA: Diagnosis not present

## 2016-07-28 DIAGNOSIS — N183 Chronic kidney disease, stage 3 (moderate): Secondary | ICD-10-CM | POA: Diagnosis not present

## 2016-07-28 DIAGNOSIS — J9611 Chronic respiratory failure with hypoxia: Secondary | ICD-10-CM | POA: Diagnosis not present

## 2016-07-28 DIAGNOSIS — E1122 Type 2 diabetes mellitus with diabetic chronic kidney disease: Secondary | ICD-10-CM | POA: Diagnosis not present

## 2016-07-28 DIAGNOSIS — I130 Hypertensive heart and chronic kidney disease with heart failure and stage 1 through stage 4 chronic kidney disease, or unspecified chronic kidney disease: Secondary | ICD-10-CM | POA: Diagnosis not present

## 2016-07-29 DIAGNOSIS — I130 Hypertensive heart and chronic kidney disease with heart failure and stage 1 through stage 4 chronic kidney disease, or unspecified chronic kidney disease: Secondary | ICD-10-CM | POA: Diagnosis not present

## 2016-07-29 DIAGNOSIS — E1122 Type 2 diabetes mellitus with diabetic chronic kidney disease: Secondary | ICD-10-CM | POA: Diagnosis not present

## 2016-07-29 DIAGNOSIS — N183 Chronic kidney disease, stage 3 (moderate): Secondary | ICD-10-CM | POA: Diagnosis not present

## 2016-07-29 DIAGNOSIS — J9611 Chronic respiratory failure with hypoxia: Secondary | ICD-10-CM | POA: Diagnosis not present

## 2016-07-29 DIAGNOSIS — I5032 Chronic diastolic (congestive) heart failure: Secondary | ICD-10-CM | POA: Diagnosis not present

## 2016-07-29 DIAGNOSIS — E1121 Type 2 diabetes mellitus with diabetic nephropathy: Secondary | ICD-10-CM | POA: Diagnosis not present

## 2016-07-30 DIAGNOSIS — E1121 Type 2 diabetes mellitus with diabetic nephropathy: Secondary | ICD-10-CM | POA: Diagnosis not present

## 2016-07-30 DIAGNOSIS — E1122 Type 2 diabetes mellitus with diabetic chronic kidney disease: Secondary | ICD-10-CM | POA: Diagnosis not present

## 2016-07-30 DIAGNOSIS — I5032 Chronic diastolic (congestive) heart failure: Secondary | ICD-10-CM | POA: Diagnosis not present

## 2016-07-30 DIAGNOSIS — J9611 Chronic respiratory failure with hypoxia: Secondary | ICD-10-CM | POA: Diagnosis not present

## 2016-07-30 DIAGNOSIS — N183 Chronic kidney disease, stage 3 (moderate): Secondary | ICD-10-CM | POA: Diagnosis not present

## 2016-07-30 DIAGNOSIS — I130 Hypertensive heart and chronic kidney disease with heart failure and stage 1 through stage 4 chronic kidney disease, or unspecified chronic kidney disease: Secondary | ICD-10-CM | POA: Diagnosis not present

## 2016-07-30 NOTE — Telephone Encounter (Signed)
Left a message for Vaughan Basta to call back.

## 2016-07-30 NOTE — Addendum Note (Signed)
Addended by: Ria Bush on: 07/30/2016 02:06 PM   Modules accepted: Orders

## 2016-07-30 NOTE — Telephone Encounter (Addendum)
Received request from hospice - will continue lorazepam 1mg  at bedtime, add 0.5mg  lorazepam at 12pm (1/2 tab). Ok to do. To let us know when running low on current ativan refill. Hospice request placed in my CMA box

## 2016-07-30 NOTE — Telephone Encounter (Signed)
plz call Vaughan Basta or Marzetta Board at [facility] - let's try increasing ativan to BID (2pm and 10pm) for pain and anxiety. Update [place] with effect. To let us know when running low on ativan to refill.

## 2016-08-01 DIAGNOSIS — E785 Hyperlipidemia, unspecified: Secondary | ICD-10-CM | POA: Diagnosis not present

## 2016-08-01 DIAGNOSIS — Z7901 Long term (current) use of anticoagulants: Secondary | ICD-10-CM | POA: Diagnosis not present

## 2016-08-01 DIAGNOSIS — S51811D Laceration without foreign body of right forearm, subsequent encounter: Secondary | ICD-10-CM | POA: Diagnosis not present

## 2016-08-01 DIAGNOSIS — E1121 Type 2 diabetes mellitus with diabetic nephropathy: Secondary | ICD-10-CM | POA: Diagnosis not present

## 2016-08-01 DIAGNOSIS — E1122 Type 2 diabetes mellitus with diabetic chronic kidney disease: Secondary | ICD-10-CM | POA: Diagnosis not present

## 2016-08-01 DIAGNOSIS — S90416D Abrasion, unspecified lesser toe(s), subsequent encounter: Secondary | ICD-10-CM | POA: Diagnosis not present

## 2016-08-01 DIAGNOSIS — Z7984 Long term (current) use of oral hypoglycemic drugs: Secondary | ICD-10-CM | POA: Diagnosis not present

## 2016-08-01 DIAGNOSIS — R531 Weakness: Secondary | ICD-10-CM | POA: Diagnosis not present

## 2016-08-01 DIAGNOSIS — Z9981 Dependence on supplemental oxygen: Secondary | ICD-10-CM | POA: Diagnosis not present

## 2016-08-01 DIAGNOSIS — J9611 Chronic respiratory failure with hypoxia: Secondary | ICD-10-CM | POA: Diagnosis not present

## 2016-08-01 DIAGNOSIS — I130 Hypertensive heart and chronic kidney disease with heart failure and stage 1 through stage 4 chronic kidney disease, or unspecified chronic kidney disease: Secondary | ICD-10-CM | POA: Diagnosis not present

## 2016-08-01 DIAGNOSIS — I25708 Atherosclerosis of coronary artery bypass graft(s), unspecified, with other forms of angina pectoris: Secondary | ICD-10-CM | POA: Diagnosis not present

## 2016-08-01 DIAGNOSIS — S61411D Laceration without foreign body of right hand, subsequent encounter: Secondary | ICD-10-CM | POA: Diagnosis not present

## 2016-08-01 DIAGNOSIS — I5032 Chronic diastolic (congestive) heart failure: Secondary | ICD-10-CM | POA: Diagnosis not present

## 2016-08-01 DIAGNOSIS — Z7902 Long term (current) use of antithrombotics/antiplatelets: Secondary | ICD-10-CM | POA: Diagnosis not present

## 2016-08-01 DIAGNOSIS — Z9181 History of falling: Secondary | ICD-10-CM | POA: Diagnosis not present

## 2016-08-01 DIAGNOSIS — N183 Chronic kidney disease, stage 3 (moderate): Secondary | ICD-10-CM | POA: Diagnosis not present

## 2016-08-02 ENCOUNTER — Telehealth: Payer: Self-pay

## 2016-08-02 DIAGNOSIS — I5032 Chronic diastolic (congestive) heart failure: Secondary | ICD-10-CM | POA: Diagnosis not present

## 2016-08-02 DIAGNOSIS — I130 Hypertensive heart and chronic kidney disease with heart failure and stage 1 through stage 4 chronic kidney disease, or unspecified chronic kidney disease: Secondary | ICD-10-CM | POA: Diagnosis not present

## 2016-08-02 DIAGNOSIS — E1121 Type 2 diabetes mellitus with diabetic nephropathy: Secondary | ICD-10-CM | POA: Diagnosis not present

## 2016-08-02 DIAGNOSIS — N183 Chronic kidney disease, stage 3 (moderate): Secondary | ICD-10-CM | POA: Diagnosis not present

## 2016-08-02 DIAGNOSIS — E1122 Type 2 diabetes mellitus with diabetic chronic kidney disease: Secondary | ICD-10-CM | POA: Diagnosis not present

## 2016-08-02 DIAGNOSIS — J9611 Chronic respiratory failure with hypoxia: Secondary | ICD-10-CM | POA: Diagnosis not present

## 2016-08-02 NOTE — Telephone Encounter (Signed)
Would like to try extra 1/2 dose of ativan at noon as per recent discussions with hospice nurse. Pt can continue to take tramadol 3pm and 10pm. Update [place] with effect.

## 2016-08-02 NOTE — Telephone Encounter (Signed)
Left message to call office

## 2016-08-02 NOTE — Telephone Encounter (Signed)
Christine Blanchard from [facility] called to ask if she is still taking tramadol at 3pm and 10pm. She is still in pain. Hospice had asked for a morning dose and was told no. What can she do for pain, Tylenol is not helping. Call [facility] back at [phone].

## 2016-08-02 NOTE — Telephone Encounter (Signed)
Christine Blanchard from [facility] called to ask if she is still taking tramadol at 3pm and 10pm. She is still in pain. Hospice had asked for a morning dose and was told no. What can she do for pain, Tylenol is not helping. Call [place] back at [phone].

## 2016-08-02 NOTE — Telephone Encounter (Signed)
Linda from [facility] called to ask if she is still taking tramadol at 3pm and 10pm. She is still in pain. Hospice had asked for a morning dose and was told no. What can she do for pain, Tylenol is not helping. Call [place] back at [phone].

## 2016-08-03 ENCOUNTER — Other Ambulatory Visit: Payer: Self-pay | Admitting: Family Medicine

## 2016-08-03 DIAGNOSIS — J9611 Chronic respiratory failure with hypoxia: Secondary | ICD-10-CM | POA: Diagnosis not present

## 2016-08-03 DIAGNOSIS — I130 Hypertensive heart and chronic kidney disease with heart failure and stage 1 through stage 4 chronic kidney disease, or unspecified chronic kidney disease: Secondary | ICD-10-CM | POA: Diagnosis not present

## 2016-08-03 DIAGNOSIS — E1122 Type 2 diabetes mellitus with diabetic chronic kidney disease: Secondary | ICD-10-CM | POA: Diagnosis not present

## 2016-08-03 DIAGNOSIS — E1121 Type 2 diabetes mellitus with diabetic nephropathy: Secondary | ICD-10-CM | POA: Diagnosis not present

## 2016-08-03 DIAGNOSIS — N183 Chronic kidney disease, stage 3 (moderate): Secondary | ICD-10-CM | POA: Diagnosis not present

## 2016-08-03 DIAGNOSIS — I5032 Chronic diastolic (congestive) heart failure: Secondary | ICD-10-CM | POA: Diagnosis not present

## 2016-08-03 MED ORDER — LORAZEPAM 1 MG PO TABS: ORAL_TABLET | ORAL | 3 refills | Status: DC

## 2016-08-03 NOTE — Telephone Encounter (Signed)
rx printed and handed to [facility].

## 2016-08-03 NOTE — Telephone Encounter (Signed)
Rx faxed to [facility] as instructed and Left message on voicemail

## 2016-08-03 NOTE — Telephone Encounter (Signed)
Stacy PT with [facility] called to get update on lorazepam for pt. Stacy request order faxed to [facility] fax # [phone].

## 2016-08-03 NOTE — Telephone Encounter (Signed)
Linda at [facility] called to say she didn't receive the rx.  Please fax rx again to [facility] at [phone].  Vaughan Basta can be reached at [phone].

## 2016-08-03 NOTE — Telephone Encounter (Signed)
Spoke to [facility] [place]. Advised her the order was faxed on 07-30-16. She will call to see what happened to it.

## 2016-08-03 NOTE — Telephone Encounter (Signed)
Rx faxed to number given below and called Vaughan Basta and let msg on VM

## 2016-08-03 NOTE — Addendum Note (Signed)
Addended by: Ria Bush on: 08/03/2016 05:27 PM   Modules accepted: Orders

## 2016-08-03 NOTE — Telephone Encounter (Signed)
See below and phone note.

## 2016-08-04 DIAGNOSIS — N183 Chronic kidney disease, stage 3 (moderate): Secondary | ICD-10-CM | POA: Diagnosis not present

## 2016-08-04 DIAGNOSIS — I5032 Chronic diastolic (congestive) heart failure: Secondary | ICD-10-CM | POA: Diagnosis not present

## 2016-08-04 DIAGNOSIS — E1121 Type 2 diabetes mellitus with diabetic nephropathy: Secondary | ICD-10-CM | POA: Diagnosis not present

## 2016-08-04 DIAGNOSIS — J9611 Chronic respiratory failure with hypoxia: Secondary | ICD-10-CM | POA: Diagnosis not present

## 2016-08-04 DIAGNOSIS — E1122 Type 2 diabetes mellitus with diabetic chronic kidney disease: Secondary | ICD-10-CM | POA: Diagnosis not present

## 2016-08-04 DIAGNOSIS — I130 Hypertensive heart and chronic kidney disease with heart failure and stage 1 through stage 4 chronic kidney disease, or unspecified chronic kidney disease: Secondary | ICD-10-CM | POA: Diagnosis not present

## 2016-08-05 DIAGNOSIS — J9611 Chronic respiratory failure with hypoxia: Secondary | ICD-10-CM | POA: Diagnosis not present

## 2016-08-05 DIAGNOSIS — E1122 Type 2 diabetes mellitus with diabetic chronic kidney disease: Secondary | ICD-10-CM | POA: Diagnosis not present

## 2016-08-05 DIAGNOSIS — I5032 Chronic diastolic (congestive) heart failure: Secondary | ICD-10-CM | POA: Diagnosis not present

## 2016-08-05 DIAGNOSIS — I130 Hypertensive heart and chronic kidney disease with heart failure and stage 1 through stage 4 chronic kidney disease, or unspecified chronic kidney disease: Secondary | ICD-10-CM | POA: Diagnosis not present

## 2016-08-05 DIAGNOSIS — N183 Chronic kidney disease, stage 3 (moderate): Secondary | ICD-10-CM | POA: Diagnosis not present

## 2016-08-05 DIAGNOSIS — E1121 Type 2 diabetes mellitus with diabetic nephropathy: Secondary | ICD-10-CM | POA: Diagnosis not present

## 2016-08-06 DIAGNOSIS — E1122 Type 2 diabetes mellitus with diabetic chronic kidney disease: Secondary | ICD-10-CM | POA: Diagnosis not present

## 2016-08-06 DIAGNOSIS — N183 Chronic kidney disease, stage 3 (moderate): Secondary | ICD-10-CM | POA: Diagnosis not present

## 2016-08-06 DIAGNOSIS — I130 Hypertensive heart and chronic kidney disease with heart failure and stage 1 through stage 4 chronic kidney disease, or unspecified chronic kidney disease: Secondary | ICD-10-CM | POA: Diagnosis not present

## 2016-08-06 DIAGNOSIS — J9611 Chronic respiratory failure with hypoxia: Secondary | ICD-10-CM | POA: Diagnosis not present

## 2016-08-06 DIAGNOSIS — E1121 Type 2 diabetes mellitus with diabetic nephropathy: Secondary | ICD-10-CM | POA: Diagnosis not present

## 2016-08-06 DIAGNOSIS — I5032 Chronic diastolic (congestive) heart failure: Secondary | ICD-10-CM | POA: Diagnosis not present

## 2016-08-09 DIAGNOSIS — N183 Chronic kidney disease, stage 3 (moderate): Secondary | ICD-10-CM | POA: Diagnosis not present

## 2016-08-09 DIAGNOSIS — I130 Hypertensive heart and chronic kidney disease with heart failure and stage 1 through stage 4 chronic kidney disease, or unspecified chronic kidney disease: Secondary | ICD-10-CM | POA: Diagnosis not present

## 2016-08-09 DIAGNOSIS — E1121 Type 2 diabetes mellitus with diabetic nephropathy: Secondary | ICD-10-CM | POA: Diagnosis not present

## 2016-08-09 DIAGNOSIS — J9611 Chronic respiratory failure with hypoxia: Secondary | ICD-10-CM | POA: Diagnosis not present

## 2016-08-09 DIAGNOSIS — I5032 Chronic diastolic (congestive) heart failure: Secondary | ICD-10-CM | POA: Diagnosis not present

## 2016-08-09 DIAGNOSIS — E1122 Type 2 diabetes mellitus with diabetic chronic kidney disease: Secondary | ICD-10-CM | POA: Diagnosis not present

## 2016-08-10 DIAGNOSIS — E1122 Type 2 diabetes mellitus with diabetic chronic kidney disease: Secondary | ICD-10-CM | POA: Diagnosis not present

## 2016-08-10 DIAGNOSIS — E1121 Type 2 diabetes mellitus with diabetic nephropathy: Secondary | ICD-10-CM | POA: Diagnosis not present

## 2016-08-10 DIAGNOSIS — N183 Chronic kidney disease, stage 3 (moderate): Secondary | ICD-10-CM | POA: Diagnosis not present

## 2016-08-10 DIAGNOSIS — J9611 Chronic respiratory failure with hypoxia: Secondary | ICD-10-CM | POA: Diagnosis not present

## 2016-08-10 DIAGNOSIS — I130 Hypertensive heart and chronic kidney disease with heart failure and stage 1 through stage 4 chronic kidney disease, or unspecified chronic kidney disease: Secondary | ICD-10-CM | POA: Diagnosis not present

## 2016-08-10 DIAGNOSIS — I5032 Chronic diastolic (congestive) heart failure: Secondary | ICD-10-CM | POA: Diagnosis not present

## 2016-08-11 DIAGNOSIS — E1122 Type 2 diabetes mellitus with diabetic chronic kidney disease: Secondary | ICD-10-CM | POA: Diagnosis not present

## 2016-08-11 DIAGNOSIS — J9611 Chronic respiratory failure with hypoxia: Secondary | ICD-10-CM | POA: Diagnosis not present

## 2016-08-11 DIAGNOSIS — E1121 Type 2 diabetes mellitus with diabetic nephropathy: Secondary | ICD-10-CM | POA: Diagnosis not present

## 2016-08-11 DIAGNOSIS — I130 Hypertensive heart and chronic kidney disease with heart failure and stage 1 through stage 4 chronic kidney disease, or unspecified chronic kidney disease: Secondary | ICD-10-CM | POA: Diagnosis not present

## 2016-08-11 DIAGNOSIS — I5032 Chronic diastolic (congestive) heart failure: Secondary | ICD-10-CM | POA: Diagnosis not present

## 2016-08-11 DIAGNOSIS — N183 Chronic kidney disease, stage 3 (moderate): Secondary | ICD-10-CM | POA: Diagnosis not present

## 2016-08-12 DIAGNOSIS — E1122 Type 2 diabetes mellitus with diabetic chronic kidney disease: Secondary | ICD-10-CM | POA: Diagnosis not present

## 2016-08-12 DIAGNOSIS — N183 Chronic kidney disease, stage 3 (moderate): Secondary | ICD-10-CM | POA: Diagnosis not present

## 2016-08-12 DIAGNOSIS — J9611 Chronic respiratory failure with hypoxia: Secondary | ICD-10-CM | POA: Diagnosis not present

## 2016-08-12 DIAGNOSIS — I5032 Chronic diastolic (congestive) heart failure: Secondary | ICD-10-CM | POA: Diagnosis not present

## 2016-08-12 DIAGNOSIS — E1121 Type 2 diabetes mellitus with diabetic nephropathy: Secondary | ICD-10-CM | POA: Diagnosis not present

## 2016-08-12 DIAGNOSIS — I130 Hypertensive heart and chronic kidney disease with heart failure and stage 1 through stage 4 chronic kidney disease, or unspecified chronic kidney disease: Secondary | ICD-10-CM | POA: Diagnosis not present

## 2016-08-13 DIAGNOSIS — N183 Chronic kidney disease, stage 3 (moderate): Secondary | ICD-10-CM | POA: Diagnosis not present

## 2016-08-13 DIAGNOSIS — E1121 Type 2 diabetes mellitus with diabetic nephropathy: Secondary | ICD-10-CM | POA: Diagnosis not present

## 2016-08-13 DIAGNOSIS — I5032 Chronic diastolic (congestive) heart failure: Secondary | ICD-10-CM | POA: Diagnosis not present

## 2016-08-13 DIAGNOSIS — I130 Hypertensive heart and chronic kidney disease with heart failure and stage 1 through stage 4 chronic kidney disease, or unspecified chronic kidney disease: Secondary | ICD-10-CM | POA: Diagnosis not present

## 2016-08-13 DIAGNOSIS — E1122 Type 2 diabetes mellitus with diabetic chronic kidney disease: Secondary | ICD-10-CM | POA: Diagnosis not present

## 2016-08-13 DIAGNOSIS — J9611 Chronic respiratory failure with hypoxia: Secondary | ICD-10-CM | POA: Diagnosis not present

## 2016-08-16 DIAGNOSIS — E1121 Type 2 diabetes mellitus with diabetic nephropathy: Secondary | ICD-10-CM | POA: Diagnosis not present

## 2016-08-16 DIAGNOSIS — J9611 Chronic respiratory failure with hypoxia: Secondary | ICD-10-CM | POA: Diagnosis not present

## 2016-08-16 DIAGNOSIS — E1122 Type 2 diabetes mellitus with diabetic chronic kidney disease: Secondary | ICD-10-CM | POA: Diagnosis not present

## 2016-08-16 DIAGNOSIS — I130 Hypertensive heart and chronic kidney disease with heart failure and stage 1 through stage 4 chronic kidney disease, or unspecified chronic kidney disease: Secondary | ICD-10-CM | POA: Diagnosis not present

## 2016-08-16 DIAGNOSIS — N183 Chronic kidney disease, stage 3 (moderate): Secondary | ICD-10-CM | POA: Diagnosis not present

## 2016-08-16 DIAGNOSIS — I5032 Chronic diastolic (congestive) heart failure: Secondary | ICD-10-CM | POA: Diagnosis not present

## 2016-08-17 DIAGNOSIS — N183 Chronic kidney disease, stage 3 (moderate): Secondary | ICD-10-CM | POA: Diagnosis not present

## 2016-08-17 DIAGNOSIS — J9611 Chronic respiratory failure with hypoxia: Secondary | ICD-10-CM | POA: Diagnosis not present

## 2016-08-17 DIAGNOSIS — E1122 Type 2 diabetes mellitus with diabetic chronic kidney disease: Secondary | ICD-10-CM | POA: Diagnosis not present

## 2016-08-17 DIAGNOSIS — I130 Hypertensive heart and chronic kidney disease with heart failure and stage 1 through stage 4 chronic kidney disease, or unspecified chronic kidney disease: Secondary | ICD-10-CM | POA: Diagnosis not present

## 2016-08-17 DIAGNOSIS — I5032 Chronic diastolic (congestive) heart failure: Secondary | ICD-10-CM | POA: Diagnosis not present

## 2016-08-17 DIAGNOSIS — E1121 Type 2 diabetes mellitus with diabetic nephropathy: Secondary | ICD-10-CM | POA: Diagnosis not present

## 2016-08-18 DIAGNOSIS — E1121 Type 2 diabetes mellitus with diabetic nephropathy: Secondary | ICD-10-CM | POA: Diagnosis not present

## 2016-08-18 DIAGNOSIS — J9611 Chronic respiratory failure with hypoxia: Secondary | ICD-10-CM | POA: Diagnosis not present

## 2016-08-18 DIAGNOSIS — N183 Chronic kidney disease, stage 3 (moderate): Secondary | ICD-10-CM | POA: Diagnosis not present

## 2016-08-18 DIAGNOSIS — I130 Hypertensive heart and chronic kidney disease with heart failure and stage 1 through stage 4 chronic kidney disease, or unspecified chronic kidney disease: Secondary | ICD-10-CM | POA: Diagnosis not present

## 2016-08-18 DIAGNOSIS — I5032 Chronic diastolic (congestive) heart failure: Secondary | ICD-10-CM | POA: Diagnosis not present

## 2016-08-18 DIAGNOSIS — E1122 Type 2 diabetes mellitus with diabetic chronic kidney disease: Secondary | ICD-10-CM | POA: Diagnosis not present

## 2016-08-19 DIAGNOSIS — E1121 Type 2 diabetes mellitus with diabetic nephropathy: Secondary | ICD-10-CM | POA: Diagnosis not present

## 2016-08-19 DIAGNOSIS — J9611 Chronic respiratory failure with hypoxia: Secondary | ICD-10-CM | POA: Diagnosis not present

## 2016-08-19 DIAGNOSIS — E1122 Type 2 diabetes mellitus with diabetic chronic kidney disease: Secondary | ICD-10-CM | POA: Diagnosis not present

## 2016-08-19 DIAGNOSIS — I5032 Chronic diastolic (congestive) heart failure: Secondary | ICD-10-CM | POA: Diagnosis not present

## 2016-08-19 DIAGNOSIS — N183 Chronic kidney disease, stage 3 (moderate): Secondary | ICD-10-CM | POA: Diagnosis not present

## 2016-08-19 DIAGNOSIS — I130 Hypertensive heart and chronic kidney disease with heart failure and stage 1 through stage 4 chronic kidney disease, or unspecified chronic kidney disease: Secondary | ICD-10-CM | POA: Diagnosis not present

## 2016-08-20 ENCOUNTER — Other Ambulatory Visit: Payer: Self-pay | Admitting: Cardiovascular Disease

## 2016-08-20 DIAGNOSIS — J9611 Chronic respiratory failure with hypoxia: Secondary | ICD-10-CM | POA: Diagnosis not present

## 2016-08-20 DIAGNOSIS — E1121 Type 2 diabetes mellitus with diabetic nephropathy: Secondary | ICD-10-CM | POA: Diagnosis not present

## 2016-08-20 DIAGNOSIS — N183 Chronic kidney disease, stage 3 (moderate): Secondary | ICD-10-CM | POA: Diagnosis not present

## 2016-08-20 DIAGNOSIS — I130 Hypertensive heart and chronic kidney disease with heart failure and stage 1 through stage 4 chronic kidney disease, or unspecified chronic kidney disease: Secondary | ICD-10-CM | POA: Diagnosis not present

## 2016-08-20 DIAGNOSIS — I5032 Chronic diastolic (congestive) heart failure: Secondary | ICD-10-CM | POA: Diagnosis not present

## 2016-08-20 DIAGNOSIS — E1122 Type 2 diabetes mellitus with diabetic chronic kidney disease: Secondary | ICD-10-CM | POA: Diagnosis not present

## 2016-08-23 DIAGNOSIS — I5032 Chronic diastolic (congestive) heart failure: Secondary | ICD-10-CM | POA: Diagnosis not present

## 2016-08-23 DIAGNOSIS — E1121 Type 2 diabetes mellitus with diabetic nephropathy: Secondary | ICD-10-CM | POA: Diagnosis not present

## 2016-08-23 DIAGNOSIS — I130 Hypertensive heart and chronic kidney disease with heart failure and stage 1 through stage 4 chronic kidney disease, or unspecified chronic kidney disease: Secondary | ICD-10-CM | POA: Diagnosis not present

## 2016-08-23 DIAGNOSIS — N183 Chronic kidney disease, stage 3 (moderate): Secondary | ICD-10-CM | POA: Diagnosis not present

## 2016-08-23 DIAGNOSIS — J9611 Chronic respiratory failure with hypoxia: Secondary | ICD-10-CM | POA: Diagnosis not present

## 2016-08-23 DIAGNOSIS — E1122 Type 2 diabetes mellitus with diabetic chronic kidney disease: Secondary | ICD-10-CM | POA: Diagnosis not present

## 2016-08-24 DIAGNOSIS — I5032 Chronic diastolic (congestive) heart failure: Secondary | ICD-10-CM | POA: Diagnosis not present

## 2016-08-24 DIAGNOSIS — I130 Hypertensive heart and chronic kidney disease with heart failure and stage 1 through stage 4 chronic kidney disease, or unspecified chronic kidney disease: Secondary | ICD-10-CM | POA: Diagnosis not present

## 2016-08-24 DIAGNOSIS — N183 Chronic kidney disease, stage 3 (moderate): Secondary | ICD-10-CM | POA: Diagnosis not present

## 2016-08-24 DIAGNOSIS — E1122 Type 2 diabetes mellitus with diabetic chronic kidney disease: Secondary | ICD-10-CM | POA: Diagnosis not present

## 2016-08-24 DIAGNOSIS — J9611 Chronic respiratory failure with hypoxia: Secondary | ICD-10-CM | POA: Diagnosis not present

## 2016-08-24 DIAGNOSIS — E1121 Type 2 diabetes mellitus with diabetic nephropathy: Secondary | ICD-10-CM | POA: Diagnosis not present

## 2016-08-25 DIAGNOSIS — E1122 Type 2 diabetes mellitus with diabetic chronic kidney disease: Secondary | ICD-10-CM | POA: Diagnosis not present

## 2016-08-25 DIAGNOSIS — I5032 Chronic diastolic (congestive) heart failure: Secondary | ICD-10-CM | POA: Diagnosis not present

## 2016-08-25 DIAGNOSIS — I130 Hypertensive heart and chronic kidney disease with heart failure and stage 1 through stage 4 chronic kidney disease, or unspecified chronic kidney disease: Secondary | ICD-10-CM | POA: Diagnosis not present

## 2016-08-25 DIAGNOSIS — N183 Chronic kidney disease, stage 3 (moderate): Secondary | ICD-10-CM | POA: Diagnosis not present

## 2016-08-25 DIAGNOSIS — E1121 Type 2 diabetes mellitus with diabetic nephropathy: Secondary | ICD-10-CM | POA: Diagnosis not present

## 2016-08-25 DIAGNOSIS — J9611 Chronic respiratory failure with hypoxia: Secondary | ICD-10-CM | POA: Diagnosis not present

## 2016-08-26 DIAGNOSIS — I130 Hypertensive heart and chronic kidney disease with heart failure and stage 1 through stage 4 chronic kidney disease, or unspecified chronic kidney disease: Secondary | ICD-10-CM | POA: Diagnosis not present

## 2016-08-26 DIAGNOSIS — I5032 Chronic diastolic (congestive) heart failure: Secondary | ICD-10-CM | POA: Diagnosis not present

## 2016-08-26 DIAGNOSIS — J9611 Chronic respiratory failure with hypoxia: Secondary | ICD-10-CM | POA: Diagnosis not present

## 2016-08-26 DIAGNOSIS — E1122 Type 2 diabetes mellitus with diabetic chronic kidney disease: Secondary | ICD-10-CM | POA: Diagnosis not present

## 2016-08-26 DIAGNOSIS — N183 Chronic kidney disease, stage 3 (moderate): Secondary | ICD-10-CM | POA: Diagnosis not present

## 2016-08-26 DIAGNOSIS — E1121 Type 2 diabetes mellitus with diabetic nephropathy: Secondary | ICD-10-CM | POA: Diagnosis not present

## 2016-08-27 DIAGNOSIS — E1121 Type 2 diabetes mellitus with diabetic nephropathy: Secondary | ICD-10-CM | POA: Diagnosis not present

## 2016-08-27 DIAGNOSIS — I130 Hypertensive heart and chronic kidney disease with heart failure and stage 1 through stage 4 chronic kidney disease, or unspecified chronic kidney disease: Secondary | ICD-10-CM | POA: Diagnosis not present

## 2016-08-27 DIAGNOSIS — N183 Chronic kidney disease, stage 3 (moderate): Secondary | ICD-10-CM | POA: Diagnosis not present

## 2016-08-27 DIAGNOSIS — E1122 Type 2 diabetes mellitus with diabetic chronic kidney disease: Secondary | ICD-10-CM | POA: Diagnosis not present

## 2016-08-27 DIAGNOSIS — I5032 Chronic diastolic (congestive) heart failure: Secondary | ICD-10-CM | POA: Diagnosis not present

## 2016-08-27 DIAGNOSIS — J9611 Chronic respiratory failure with hypoxia: Secondary | ICD-10-CM | POA: Diagnosis not present

## 2016-08-30 DIAGNOSIS — I5032 Chronic diastolic (congestive) heart failure: Secondary | ICD-10-CM | POA: Diagnosis not present

## 2016-08-30 DIAGNOSIS — E1122 Type 2 diabetes mellitus with diabetic chronic kidney disease: Secondary | ICD-10-CM | POA: Diagnosis not present

## 2016-08-30 DIAGNOSIS — E1121 Type 2 diabetes mellitus with diabetic nephropathy: Secondary | ICD-10-CM | POA: Diagnosis not present

## 2016-08-30 DIAGNOSIS — I130 Hypertensive heart and chronic kidney disease with heart failure and stage 1 through stage 4 chronic kidney disease, or unspecified chronic kidney disease: Secondary | ICD-10-CM | POA: Diagnosis not present

## 2016-08-30 DIAGNOSIS — J9611 Chronic respiratory failure with hypoxia: Secondary | ICD-10-CM | POA: Diagnosis not present

## 2016-08-30 DIAGNOSIS — N183 Chronic kidney disease, stage 3 (moderate): Secondary | ICD-10-CM | POA: Diagnosis not present

## 2016-08-31 DIAGNOSIS — J9611 Chronic respiratory failure with hypoxia: Secondary | ICD-10-CM | POA: Diagnosis not present

## 2016-08-31 DIAGNOSIS — I5032 Chronic diastolic (congestive) heart failure: Secondary | ICD-10-CM | POA: Diagnosis not present

## 2016-08-31 DIAGNOSIS — E1122 Type 2 diabetes mellitus with diabetic chronic kidney disease: Secondary | ICD-10-CM | POA: Diagnosis not present

## 2016-08-31 DIAGNOSIS — I130 Hypertensive heart and chronic kidney disease with heart failure and stage 1 through stage 4 chronic kidney disease, or unspecified chronic kidney disease: Secondary | ICD-10-CM | POA: Diagnosis not present

## 2016-08-31 DIAGNOSIS — E1121 Type 2 diabetes mellitus with diabetic nephropathy: Secondary | ICD-10-CM | POA: Diagnosis not present

## 2016-08-31 DIAGNOSIS — N183 Chronic kidney disease, stage 3 (moderate): Secondary | ICD-10-CM | POA: Diagnosis not present

## 2016-09-01 DIAGNOSIS — Z7901 Long term (current) use of anticoagulants: Secondary | ICD-10-CM | POA: Diagnosis not present

## 2016-09-01 DIAGNOSIS — Z7984 Long term (current) use of oral hypoglycemic drugs: Secondary | ICD-10-CM | POA: Diagnosis not present

## 2016-09-01 DIAGNOSIS — Z9981 Dependence on supplemental oxygen: Secondary | ICD-10-CM | POA: Diagnosis not present

## 2016-09-01 DIAGNOSIS — J9611 Chronic respiratory failure with hypoxia: Secondary | ICD-10-CM | POA: Diagnosis not present

## 2016-09-01 DIAGNOSIS — I130 Hypertensive heart and chronic kidney disease with heart failure and stage 1 through stage 4 chronic kidney disease, or unspecified chronic kidney disease: Secondary | ICD-10-CM | POA: Diagnosis not present

## 2016-09-01 DIAGNOSIS — E785 Hyperlipidemia, unspecified: Secondary | ICD-10-CM | POA: Diagnosis not present

## 2016-09-01 DIAGNOSIS — I5032 Chronic diastolic (congestive) heart failure: Secondary | ICD-10-CM | POA: Diagnosis not present

## 2016-09-01 DIAGNOSIS — E1122 Type 2 diabetes mellitus with diabetic chronic kidney disease: Secondary | ICD-10-CM | POA: Diagnosis not present

## 2016-09-01 DIAGNOSIS — E1121 Type 2 diabetes mellitus with diabetic nephropathy: Secondary | ICD-10-CM | POA: Diagnosis not present

## 2016-09-01 DIAGNOSIS — N183 Chronic kidney disease, stage 3 (moderate): Secondary | ICD-10-CM | POA: Diagnosis not present

## 2016-09-01 DIAGNOSIS — R531 Weakness: Secondary | ICD-10-CM | POA: Diagnosis not present

## 2016-09-01 DIAGNOSIS — I25708 Atherosclerosis of coronary artery bypass graft(s), unspecified, with other forms of angina pectoris: Secondary | ICD-10-CM | POA: Diagnosis not present

## 2016-09-01 DIAGNOSIS — Z9181 History of falling: Secondary | ICD-10-CM | POA: Diagnosis not present

## 2016-09-01 DIAGNOSIS — Z7902 Long term (current) use of antithrombotics/antiplatelets: Secondary | ICD-10-CM | POA: Diagnosis not present

## 2016-09-02 DIAGNOSIS — I5032 Chronic diastolic (congestive) heart failure: Secondary | ICD-10-CM | POA: Diagnosis not present

## 2016-09-02 DIAGNOSIS — N183 Chronic kidney disease, stage 3 (moderate): Secondary | ICD-10-CM | POA: Diagnosis not present

## 2016-09-02 DIAGNOSIS — I130 Hypertensive heart and chronic kidney disease with heart failure and stage 1 through stage 4 chronic kidney disease, or unspecified chronic kidney disease: Secondary | ICD-10-CM | POA: Diagnosis not present

## 2016-09-02 DIAGNOSIS — E1122 Type 2 diabetes mellitus with diabetic chronic kidney disease: Secondary | ICD-10-CM | POA: Diagnosis not present

## 2016-09-02 DIAGNOSIS — E1121 Type 2 diabetes mellitus with diabetic nephropathy: Secondary | ICD-10-CM | POA: Diagnosis not present

## 2016-09-02 DIAGNOSIS — J9611 Chronic respiratory failure with hypoxia: Secondary | ICD-10-CM | POA: Diagnosis not present

## 2016-09-03 DIAGNOSIS — N183 Chronic kidney disease, stage 3 (moderate): Secondary | ICD-10-CM | POA: Diagnosis not present

## 2016-09-03 DIAGNOSIS — I5032 Chronic diastolic (congestive) heart failure: Secondary | ICD-10-CM | POA: Diagnosis not present

## 2016-09-03 DIAGNOSIS — E1122 Type 2 diabetes mellitus with diabetic chronic kidney disease: Secondary | ICD-10-CM | POA: Diagnosis not present

## 2016-09-03 DIAGNOSIS — J9611 Chronic respiratory failure with hypoxia: Secondary | ICD-10-CM | POA: Diagnosis not present

## 2016-09-03 DIAGNOSIS — E1121 Type 2 diabetes mellitus with diabetic nephropathy: Secondary | ICD-10-CM | POA: Diagnosis not present

## 2016-09-03 DIAGNOSIS — I130 Hypertensive heart and chronic kidney disease with heart failure and stage 1 through stage 4 chronic kidney disease, or unspecified chronic kidney disease: Secondary | ICD-10-CM | POA: Diagnosis not present

## 2016-09-03 NOTE — Telephone Encounter (Signed)
Christine Blanchard left v/m requesting hard copy for lorazepam. Advised Christine Blanchard pt should have available refills; Christine Blanchard said med came last night and she did not know it; nothing further needed.

## 2016-09-06 DIAGNOSIS — J9611 Chronic respiratory failure with hypoxia: Secondary | ICD-10-CM | POA: Diagnosis not present

## 2016-09-06 DIAGNOSIS — N183 Chronic kidney disease, stage 3 (moderate): Secondary | ICD-10-CM | POA: Diagnosis not present

## 2016-09-06 DIAGNOSIS — I5032 Chronic diastolic (congestive) heart failure: Secondary | ICD-10-CM | POA: Diagnosis not present

## 2016-09-06 DIAGNOSIS — E1122 Type 2 diabetes mellitus with diabetic chronic kidney disease: Secondary | ICD-10-CM | POA: Diagnosis not present

## 2016-09-06 DIAGNOSIS — I130 Hypertensive heart and chronic kidney disease with heart failure and stage 1 through stage 4 chronic kidney disease, or unspecified chronic kidney disease: Secondary | ICD-10-CM | POA: Diagnosis not present

## 2016-09-06 DIAGNOSIS — E1121 Type 2 diabetes mellitus with diabetic nephropathy: Secondary | ICD-10-CM | POA: Diagnosis not present

## 2016-09-07 ENCOUNTER — Telehealth: Payer: Self-pay | Admitting: Cardiovascular Disease

## 2016-09-07 DIAGNOSIS — J9611 Chronic respiratory failure with hypoxia: Secondary | ICD-10-CM | POA: Diagnosis not present

## 2016-09-07 DIAGNOSIS — E1121 Type 2 diabetes mellitus with diabetic nephropathy: Secondary | ICD-10-CM | POA: Diagnosis not present

## 2016-09-07 DIAGNOSIS — I5032 Chronic diastolic (congestive) heart failure: Secondary | ICD-10-CM | POA: Diagnosis not present

## 2016-09-07 DIAGNOSIS — I130 Hypertensive heart and chronic kidney disease with heart failure and stage 1 through stage 4 chronic kidney disease, or unspecified chronic kidney disease: Secondary | ICD-10-CM | POA: Diagnosis not present

## 2016-09-07 DIAGNOSIS — E1122 Type 2 diabetes mellitus with diabetic chronic kidney disease: Secondary | ICD-10-CM | POA: Diagnosis not present

## 2016-09-07 DIAGNOSIS — N183 Chronic kidney disease, stage 3 (moderate): Secondary | ICD-10-CM | POA: Diagnosis not present

## 2016-09-07 NOTE — Telephone Encounter (Signed)
Patient received recall letter to fu with cardiology.  She is under hospice care and does not wish to fu.  Deleting recall.

## 2016-09-08 DIAGNOSIS — J9611 Chronic respiratory failure with hypoxia: Secondary | ICD-10-CM | POA: Diagnosis not present

## 2016-09-08 DIAGNOSIS — E1121 Type 2 diabetes mellitus with diabetic nephropathy: Secondary | ICD-10-CM | POA: Diagnosis not present

## 2016-09-08 DIAGNOSIS — I5032 Chronic diastolic (congestive) heart failure: Secondary | ICD-10-CM | POA: Diagnosis not present

## 2016-09-08 DIAGNOSIS — I130 Hypertensive heart and chronic kidney disease with heart failure and stage 1 through stage 4 chronic kidney disease, or unspecified chronic kidney disease: Secondary | ICD-10-CM | POA: Diagnosis not present

## 2016-09-08 DIAGNOSIS — N183 Chronic kidney disease, stage 3 (moderate): Secondary | ICD-10-CM | POA: Diagnosis not present

## 2016-09-08 DIAGNOSIS — E1122 Type 2 diabetes mellitus with diabetic chronic kidney disease: Secondary | ICD-10-CM | POA: Diagnosis not present

## 2016-09-09 DIAGNOSIS — I130 Hypertensive heart and chronic kidney disease with heart failure and stage 1 through stage 4 chronic kidney disease, or unspecified chronic kidney disease: Secondary | ICD-10-CM | POA: Diagnosis not present

## 2016-09-09 DIAGNOSIS — N183 Chronic kidney disease, stage 3 (moderate): Secondary | ICD-10-CM | POA: Diagnosis not present

## 2016-09-09 DIAGNOSIS — E1122 Type 2 diabetes mellitus with diabetic chronic kidney disease: Secondary | ICD-10-CM | POA: Diagnosis not present

## 2016-09-09 DIAGNOSIS — I5032 Chronic diastolic (congestive) heart failure: Secondary | ICD-10-CM | POA: Diagnosis not present

## 2016-09-09 DIAGNOSIS — E1121 Type 2 diabetes mellitus with diabetic nephropathy: Secondary | ICD-10-CM | POA: Diagnosis not present

## 2016-09-09 DIAGNOSIS — J9611 Chronic respiratory failure with hypoxia: Secondary | ICD-10-CM | POA: Diagnosis not present

## 2016-09-10 ENCOUNTER — Encounter: Payer: Self-pay | Admitting: Cardiovascular Disease

## 2016-09-10 DIAGNOSIS — E1122 Type 2 diabetes mellitus with diabetic chronic kidney disease: Secondary | ICD-10-CM | POA: Diagnosis not present

## 2016-09-10 DIAGNOSIS — E1121 Type 2 diabetes mellitus with diabetic nephropathy: Secondary | ICD-10-CM | POA: Diagnosis not present

## 2016-09-10 DIAGNOSIS — J9611 Chronic respiratory failure with hypoxia: Secondary | ICD-10-CM | POA: Diagnosis not present

## 2016-09-10 DIAGNOSIS — N183 Chronic kidney disease, stage 3 (moderate): Secondary | ICD-10-CM | POA: Diagnosis not present

## 2016-09-10 DIAGNOSIS — I5032 Chronic diastolic (congestive) heart failure: Secondary | ICD-10-CM | POA: Diagnosis not present

## 2016-09-10 DIAGNOSIS — I130 Hypertensive heart and chronic kidney disease with heart failure and stage 1 through stage 4 chronic kidney disease, or unspecified chronic kidney disease: Secondary | ICD-10-CM | POA: Diagnosis not present

## 2016-09-13 DIAGNOSIS — E1121 Type 2 diabetes mellitus with diabetic nephropathy: Secondary | ICD-10-CM | POA: Diagnosis not present

## 2016-09-13 DIAGNOSIS — E1122 Type 2 diabetes mellitus with diabetic chronic kidney disease: Secondary | ICD-10-CM | POA: Diagnosis not present

## 2016-09-13 DIAGNOSIS — J9611 Chronic respiratory failure with hypoxia: Secondary | ICD-10-CM | POA: Diagnosis not present

## 2016-09-13 DIAGNOSIS — N183 Chronic kidney disease, stage 3 (moderate): Secondary | ICD-10-CM | POA: Diagnosis not present

## 2016-09-13 DIAGNOSIS — I130 Hypertensive heart and chronic kidney disease with heart failure and stage 1 through stage 4 chronic kidney disease, or unspecified chronic kidney disease: Secondary | ICD-10-CM | POA: Diagnosis not present

## 2016-09-13 DIAGNOSIS — I5032 Chronic diastolic (congestive) heart failure: Secondary | ICD-10-CM | POA: Diagnosis not present

## 2016-09-14 DIAGNOSIS — N183 Chronic kidney disease, stage 3 (moderate): Secondary | ICD-10-CM | POA: Diagnosis not present

## 2016-09-14 DIAGNOSIS — I5032 Chronic diastolic (congestive) heart failure: Secondary | ICD-10-CM | POA: Diagnosis not present

## 2016-09-14 DIAGNOSIS — E1121 Type 2 diabetes mellitus with diabetic nephropathy: Secondary | ICD-10-CM | POA: Diagnosis not present

## 2016-09-14 DIAGNOSIS — I130 Hypertensive heart and chronic kidney disease with heart failure and stage 1 through stage 4 chronic kidney disease, or unspecified chronic kidney disease: Secondary | ICD-10-CM | POA: Diagnosis not present

## 2016-09-14 DIAGNOSIS — E1122 Type 2 diabetes mellitus with diabetic chronic kidney disease: Secondary | ICD-10-CM | POA: Diagnosis not present

## 2016-09-14 DIAGNOSIS — J9611 Chronic respiratory failure with hypoxia: Secondary | ICD-10-CM | POA: Diagnosis not present

## 2016-09-15 ENCOUNTER — Telehealth: Payer: Self-pay

## 2016-09-15 DIAGNOSIS — E1122 Type 2 diabetes mellitus with diabetic chronic kidney disease: Secondary | ICD-10-CM | POA: Diagnosis not present

## 2016-09-15 DIAGNOSIS — E1121 Type 2 diabetes mellitus with diabetic nephropathy: Secondary | ICD-10-CM | POA: Diagnosis not present

## 2016-09-15 DIAGNOSIS — I130 Hypertensive heart and chronic kidney disease with heart failure and stage 1 through stage 4 chronic kidney disease, or unspecified chronic kidney disease: Secondary | ICD-10-CM | POA: Diagnosis not present

## 2016-09-15 DIAGNOSIS — J9611 Chronic respiratory failure with hypoxia: Secondary | ICD-10-CM | POA: Diagnosis not present

## 2016-09-15 DIAGNOSIS — I5032 Chronic diastolic (congestive) heart failure: Secondary | ICD-10-CM | POA: Diagnosis not present

## 2016-09-15 DIAGNOSIS — N183 Chronic kidney disease, stage 3 (moderate): Secondary | ICD-10-CM | POA: Diagnosis not present

## 2016-09-15 NOTE — Telephone Encounter (Signed)
Terri with [facility] left v/m; pt having acid reflux that is keeping pt up at night and causing pt to be hoarse; pt is at assisted living [facility]. [facility] pharmacy at [phone]. Pt has aciphex on med list #90 x 1 on 07/19/16. Unable to reach Terri by phone to see if pt is taking Aciphex.Please advise.

## 2016-09-16 DIAGNOSIS — E1122 Type 2 diabetes mellitus with diabetic chronic kidney disease: Secondary | ICD-10-CM | POA: Diagnosis not present

## 2016-09-16 DIAGNOSIS — N183 Chronic kidney disease, stage 3 (moderate): Secondary | ICD-10-CM | POA: Diagnosis not present

## 2016-09-16 DIAGNOSIS — J9611 Chronic respiratory failure with hypoxia: Secondary | ICD-10-CM | POA: Diagnosis not present

## 2016-09-16 DIAGNOSIS — I130 Hypertensive heart and chronic kidney disease with heart failure and stage 1 through stage 4 chronic kidney disease, or unspecified chronic kidney disease: Secondary | ICD-10-CM | POA: Diagnosis not present

## 2016-09-16 DIAGNOSIS — I5032 Chronic diastolic (congestive) heart failure: Secondary | ICD-10-CM | POA: Diagnosis not present

## 2016-09-16 DIAGNOSIS — E1121 Type 2 diabetes mellitus with diabetic nephropathy: Secondary | ICD-10-CM | POA: Diagnosis not present

## 2016-09-16 MED ORDER — RANITIDINE HCL 150 MG PO TABS: 150.0000 mg | ORAL_TABLET | Freq: Every day | ORAL | 1 refills | Status: DC

## 2016-09-16 NOTE — Telephone Encounter (Signed)
Let's continue aciphex 20mg  daily - and add zantac (ranitidine) 150mg  nightly, sent to pharmacy. Update [place] with effect.

## 2016-09-17 ENCOUNTER — Telehealth: Payer: Self-pay | Admitting: Family Medicine

## 2016-09-17 ENCOUNTER — Telehealth: Payer: Self-pay

## 2016-09-17 DIAGNOSIS — E1121 Type 2 diabetes mellitus with diabetic nephropathy: Secondary | ICD-10-CM | POA: Diagnosis not present

## 2016-09-17 DIAGNOSIS — I130 Hypertensive heart and chronic kidney disease with heart failure and stage 1 through stage 4 chronic kidney disease, or unspecified chronic kidney disease: Secondary | ICD-10-CM | POA: Diagnosis not present

## 2016-09-17 DIAGNOSIS — N183 Chronic kidney disease, stage 3 (moderate): Secondary | ICD-10-CM | POA: Diagnosis not present

## 2016-09-17 DIAGNOSIS — I5032 Chronic diastolic (congestive) heart failure: Secondary | ICD-10-CM | POA: Diagnosis not present

## 2016-09-17 DIAGNOSIS — E1122 Type 2 diabetes mellitus with diabetic chronic kidney disease: Secondary | ICD-10-CM | POA: Diagnosis not present

## 2016-09-17 DIAGNOSIS — J9611 Chronic respiratory failure with hypoxia: Secondary | ICD-10-CM | POA: Diagnosis not present

## 2016-09-17 NOTE — Telephone Encounter (Signed)
Patient called and said someone called and left a message. Did you call patient?

## 2016-09-17 NOTE — Telephone Encounter (Signed)
I was paged by [facility] asking for a hard script for pt's Tramadol and Lorazepam.  I told them that this was not possible on call and that even calling in controlled substances was against our on-call policy.  They offered to connect me w/ Kandis Mannan.  I spoke w/ Melissa who again asked for refills on Lorazepam and Tramadol and when I explained the after hours controlled substance policy she became upset.  I told her I would have to return home and check the pt's chart to see what I could do.  I see that there was a phone note from earlier today on the Tramadol but the Lorazepam was not mentioned.  After reviewing the med list, pt should have enough Lorazepam to last until 11/3.  First Melissa indicated that she didn't take 1mg , I needed to send 2 mg.  I told her that was not what's on her med list.  She then told me she only had 3 of the 1/2 tabs remaining.  I told her she needed to contact the pharmacy as there were refills available.  She said ok and hung up.  Will route to PCP as Juluis Rainier

## 2016-09-17 NOTE — Telephone Encounter (Signed)
Lm on pts vm requesting a call back 

## 2016-09-17 NOTE — Telephone Encounter (Signed)
Christine Blanchard with [facility] called to say the pt is still having issues with acid reflux at night and was asking about taking Pepcid at night. Please call [phone]

## 2016-09-17 NOTE — Telephone Encounter (Signed)
Spoke to Terri and advised per Dr Danise Mina

## 2016-09-17 NOTE — Telephone Encounter (Signed)
Christine Blanchard with [name] request refill on tramadol. I advised Christine Blanchard that it was too early to get refill on tramadol; last refilled # 60 x 3 on 06/23/16 so pt should have med to last until 10/24/16 taking 2 tabs a day. Christine Blanchard said pt has 41 pills and that will not last until 10/24/16. Christine Blanchard said it takes 2 wks to get med from pharmacy. I advised from now until 10/24/16 pt should have 74 pills left. Christine Blanchard said pt just needs her tramadol refilled. I advised we need to find out where the tramadol are. Christine Blanchard checked on something and said she thinks there is one more refill at pharmacy. Christine Blanchard will ck with pharmacy and cb if needed. FYI to Dr Darnell Level.

## 2016-09-17 NOTE — Telephone Encounter (Signed)
Spoke to pt and advised Rx sent to pharmacy 

## 2016-09-20 ENCOUNTER — Telehealth: Payer: Self-pay

## 2016-09-20 DIAGNOSIS — N183 Chronic kidney disease, stage 3 (moderate): Secondary | ICD-10-CM | POA: Diagnosis not present

## 2016-09-20 DIAGNOSIS — I5032 Chronic diastolic (congestive) heart failure: Secondary | ICD-10-CM | POA: Diagnosis not present

## 2016-09-20 DIAGNOSIS — E1122 Type 2 diabetes mellitus with diabetic chronic kidney disease: Secondary | ICD-10-CM | POA: Diagnosis not present

## 2016-09-20 DIAGNOSIS — E1121 Type 2 diabetes mellitus with diabetic nephropathy: Secondary | ICD-10-CM | POA: Diagnosis not present

## 2016-09-20 DIAGNOSIS — I130 Hypertensive heart and chronic kidney disease with heart failure and stage 1 through stage 4 chronic kidney disease, or unspecified chronic kidney disease: Secondary | ICD-10-CM | POA: Diagnosis not present

## 2016-09-20 DIAGNOSIS — J9611 Chronic respiratory failure with hypoxia: Secondary | ICD-10-CM | POA: Diagnosis not present

## 2016-09-20 NOTE — Telephone Encounter (Signed)
See other phone note

## 2016-09-20 NOTE — Telephone Encounter (Signed)
PLEASE NOTE: All timestamps contained within this report are represented as [place] Standard Time. CONFIDENTIALTY NOTICE: This fax transmission is intended only for the addressee. It contains information that is legally privileged, confidential or otherwise protected from use or disclosure. If you are not the intended recipient, you are strictly prohibited from reviewing, disclosing, copying using or disseminating any of this information or taking any action in reliance on or regarding this information. If you have received this fax in error, please notify us immediately by telephone so that we can arrange for its return to [place]. Phone: [phone], Toll-Free: [phone], Fax: [phone] Page: 1 of 1 Call Id: [id number] [place] Night - Client Nonclinical Telephone Record [place] Night - Client Client Site [place] Physician Ria Bush - MD Contact Type Call Who Is Calling Physician / Provider / Hospital Call Type Provider Call [facility] Page Now Reason for Call Request to speak to Physician Initial Comment Caller States Melissa calling [facility] at [phone] ,Pt needs Rx called in for these meds Tramadol 50 mil and Lorazepam 1 mil Additional Comment Patient Name Christine Blanchard Patient DOB [birth year] Requesting Provider [place] Physician Number [phone] Facility Name [facility] Paging DoctorName Phone DateTime Result/Outcome Message Type Notes Crissie Sickles - MD [id number] 09/17/2016 6:00:02 PM Paged On Call Back to Call Center Doctor Paged Pls call [facility] w/[facility] at [phone], in regards to a pt. Dimple Nanas - MD [phone] 09/17/2016 7:04:10 PM Paged On Call Back to Call Center Doctor Paged Pls call Tito Dine w/[facility] at [phone], in regards to a  patient. Dimple Nanas - MD 09/17/2016 7:09:57 PM Spoke with On Call - General Message Result Call Closed By: Cornelious Bryant Transaction Date/Time: 09/17/2016 5:46:32 PM (ET)

## 2016-09-21 DIAGNOSIS — E1122 Type 2 diabetes mellitus with diabetic chronic kidney disease: Secondary | ICD-10-CM | POA: Diagnosis not present

## 2016-09-21 DIAGNOSIS — I130 Hypertensive heart and chronic kidney disease with heart failure and stage 1 through stage 4 chronic kidney disease, or unspecified chronic kidney disease: Secondary | ICD-10-CM | POA: Diagnosis not present

## 2016-09-21 DIAGNOSIS — J9611 Chronic respiratory failure with hypoxia: Secondary | ICD-10-CM | POA: Diagnosis not present

## 2016-09-21 DIAGNOSIS — I5032 Chronic diastolic (congestive) heart failure: Secondary | ICD-10-CM | POA: Diagnosis not present

## 2016-09-21 DIAGNOSIS — N183 Chronic kidney disease, stage 3 (moderate): Secondary | ICD-10-CM | POA: Diagnosis not present

## 2016-09-21 DIAGNOSIS — E1121 Type 2 diabetes mellitus with diabetic nephropathy: Secondary | ICD-10-CM | POA: Diagnosis not present

## 2016-09-22 DIAGNOSIS — E1122 Type 2 diabetes mellitus with diabetic chronic kidney disease: Secondary | ICD-10-CM | POA: Diagnosis not present

## 2016-09-22 DIAGNOSIS — N183 Chronic kidney disease, stage 3 (moderate): Secondary | ICD-10-CM | POA: Diagnosis not present

## 2016-09-22 DIAGNOSIS — I130 Hypertensive heart and chronic kidney disease with heart failure and stage 1 through stage 4 chronic kidney disease, or unspecified chronic kidney disease: Secondary | ICD-10-CM | POA: Diagnosis not present

## 2016-09-22 DIAGNOSIS — E1121 Type 2 diabetes mellitus with diabetic nephropathy: Secondary | ICD-10-CM | POA: Diagnosis not present

## 2016-09-22 DIAGNOSIS — J9611 Chronic respiratory failure with hypoxia: Secondary | ICD-10-CM | POA: Diagnosis not present

## 2016-09-22 DIAGNOSIS — I5032 Chronic diastolic (congestive) heart failure: Secondary | ICD-10-CM | POA: Diagnosis not present

## 2016-09-23 DIAGNOSIS — I5032 Chronic diastolic (congestive) heart failure: Secondary | ICD-10-CM | POA: Diagnosis not present

## 2016-09-23 DIAGNOSIS — N183 Chronic kidney disease, stage 3 (moderate): Secondary | ICD-10-CM | POA: Diagnosis not present

## 2016-09-23 DIAGNOSIS — J9611 Chronic respiratory failure with hypoxia: Secondary | ICD-10-CM | POA: Diagnosis not present

## 2016-09-23 DIAGNOSIS — E1122 Type 2 diabetes mellitus with diabetic chronic kidney disease: Secondary | ICD-10-CM | POA: Diagnosis not present

## 2016-09-23 DIAGNOSIS — E1121 Type 2 diabetes mellitus with diabetic nephropathy: Secondary | ICD-10-CM | POA: Diagnosis not present

## 2016-09-23 DIAGNOSIS — I130 Hypertensive heart and chronic kidney disease with heart failure and stage 1 through stage 4 chronic kidney disease, or unspecified chronic kidney disease: Secondary | ICD-10-CM | POA: Diagnosis not present

## 2016-09-24 DIAGNOSIS — E1122 Type 2 diabetes mellitus with diabetic chronic kidney disease: Secondary | ICD-10-CM | POA: Diagnosis not present

## 2016-09-24 DIAGNOSIS — I5032 Chronic diastolic (congestive) heart failure: Secondary | ICD-10-CM | POA: Diagnosis not present

## 2016-09-24 DIAGNOSIS — J9611 Chronic respiratory failure with hypoxia: Secondary | ICD-10-CM | POA: Diagnosis not present

## 2016-09-24 DIAGNOSIS — I130 Hypertensive heart and chronic kidney disease with heart failure and stage 1 through stage 4 chronic kidney disease, or unspecified chronic kidney disease: Secondary | ICD-10-CM | POA: Diagnosis not present

## 2016-09-24 DIAGNOSIS — N183 Chronic kidney disease, stage 3 (moderate): Secondary | ICD-10-CM | POA: Diagnosis not present

## 2016-09-24 DIAGNOSIS — E1121 Type 2 diabetes mellitus with diabetic nephropathy: Secondary | ICD-10-CM | POA: Diagnosis not present

## 2016-09-27 ENCOUNTER — Other Ambulatory Visit: Payer: Self-pay | Admitting: Family Medicine

## 2016-09-27 DIAGNOSIS — I130 Hypertensive heart and chronic kidney disease with heart failure and stage 1 through stage 4 chronic kidney disease, or unspecified chronic kidney disease: Secondary | ICD-10-CM | POA: Diagnosis not present

## 2016-09-27 DIAGNOSIS — J9611 Chronic respiratory failure with hypoxia: Secondary | ICD-10-CM | POA: Diagnosis not present

## 2016-09-27 DIAGNOSIS — E1122 Type 2 diabetes mellitus with diabetic chronic kidney disease: Secondary | ICD-10-CM | POA: Diagnosis not present

## 2016-09-27 DIAGNOSIS — I5032 Chronic diastolic (congestive) heart failure: Secondary | ICD-10-CM | POA: Diagnosis not present

## 2016-09-27 DIAGNOSIS — E1121 Type 2 diabetes mellitus with diabetic nephropathy: Secondary | ICD-10-CM | POA: Diagnosis not present

## 2016-09-27 DIAGNOSIS — N183 Chronic kidney disease, stage 3 (moderate): Secondary | ICD-10-CM | POA: Diagnosis not present

## 2016-09-28 DIAGNOSIS — J9611 Chronic respiratory failure with hypoxia: Secondary | ICD-10-CM | POA: Diagnosis not present

## 2016-09-28 DIAGNOSIS — N183 Chronic kidney disease, stage 3 (moderate): Secondary | ICD-10-CM | POA: Diagnosis not present

## 2016-09-28 DIAGNOSIS — E1122 Type 2 diabetes mellitus with diabetic chronic kidney disease: Secondary | ICD-10-CM | POA: Diagnosis not present

## 2016-09-28 DIAGNOSIS — I130 Hypertensive heart and chronic kidney disease with heart failure and stage 1 through stage 4 chronic kidney disease, or unspecified chronic kidney disease: Secondary | ICD-10-CM | POA: Diagnosis not present

## 2016-09-28 DIAGNOSIS — I5032 Chronic diastolic (congestive) heart failure: Secondary | ICD-10-CM | POA: Diagnosis not present

## 2016-09-28 DIAGNOSIS — E1121 Type 2 diabetes mellitus with diabetic nephropathy: Secondary | ICD-10-CM | POA: Diagnosis not present

## 2016-09-29 DIAGNOSIS — I5032 Chronic diastolic (congestive) heart failure: Secondary | ICD-10-CM | POA: Diagnosis not present

## 2016-09-29 DIAGNOSIS — E1122 Type 2 diabetes mellitus with diabetic chronic kidney disease: Secondary | ICD-10-CM | POA: Diagnosis not present

## 2016-09-29 DIAGNOSIS — E1121 Type 2 diabetes mellitus with diabetic nephropathy: Secondary | ICD-10-CM | POA: Diagnosis not present

## 2016-09-29 DIAGNOSIS — N183 Chronic kidney disease, stage 3 (moderate): Secondary | ICD-10-CM | POA: Diagnosis not present

## 2016-09-29 DIAGNOSIS — J9611 Chronic respiratory failure with hypoxia: Secondary | ICD-10-CM | POA: Diagnosis not present

## 2016-09-29 DIAGNOSIS — I130 Hypertensive heart and chronic kidney disease with heart failure and stage 1 through stage 4 chronic kidney disease, or unspecified chronic kidney disease: Secondary | ICD-10-CM | POA: Diagnosis not present

## 2016-09-30 DIAGNOSIS — I5032 Chronic diastolic (congestive) heart failure: Secondary | ICD-10-CM | POA: Diagnosis not present

## 2016-09-30 DIAGNOSIS — J9611 Chronic respiratory failure with hypoxia: Secondary | ICD-10-CM | POA: Diagnosis not present

## 2016-09-30 DIAGNOSIS — E1122 Type 2 diabetes mellitus with diabetic chronic kidney disease: Secondary | ICD-10-CM | POA: Diagnosis not present

## 2016-09-30 DIAGNOSIS — E1121 Type 2 diabetes mellitus with diabetic nephropathy: Secondary | ICD-10-CM | POA: Diagnosis not present

## 2016-09-30 DIAGNOSIS — N183 Chronic kidney disease, stage 3 (moderate): Secondary | ICD-10-CM | POA: Diagnosis not present

## 2016-09-30 DIAGNOSIS — I130 Hypertensive heart and chronic kidney disease with heart failure and stage 1 through stage 4 chronic kidney disease, or unspecified chronic kidney disease: Secondary | ICD-10-CM | POA: Diagnosis not present

## 2016-10-01 ENCOUNTER — Other Ambulatory Visit: Payer: Self-pay | Admitting: Family Medicine

## 2016-10-01 DIAGNOSIS — I130 Hypertensive heart and chronic kidney disease with heart failure and stage 1 through stage 4 chronic kidney disease, or unspecified chronic kidney disease: Secondary | ICD-10-CM | POA: Diagnosis not present

## 2016-10-01 DIAGNOSIS — E1121 Type 2 diabetes mellitus with diabetic nephropathy: Secondary | ICD-10-CM | POA: Diagnosis not present

## 2016-10-01 DIAGNOSIS — J9611 Chronic respiratory failure with hypoxia: Secondary | ICD-10-CM | POA: Diagnosis not present

## 2016-10-01 DIAGNOSIS — N183 Chronic kidney disease, stage 3 (moderate): Secondary | ICD-10-CM | POA: Diagnosis not present

## 2016-10-01 DIAGNOSIS — I5032 Chronic diastolic (congestive) heart failure: Secondary | ICD-10-CM | POA: Diagnosis not present

## 2016-10-01 DIAGNOSIS — E1122 Type 2 diabetes mellitus with diabetic chronic kidney disease: Secondary | ICD-10-CM | POA: Diagnosis not present

## 2016-10-02 DIAGNOSIS — I130 Hypertensive heart and chronic kidney disease with heart failure and stage 1 through stage 4 chronic kidney disease, or unspecified chronic kidney disease: Secondary | ICD-10-CM | POA: Diagnosis not present

## 2016-10-02 DIAGNOSIS — Z7984 Long term (current) use of oral hypoglycemic drugs: Secondary | ICD-10-CM | POA: Diagnosis not present

## 2016-10-02 DIAGNOSIS — J9611 Chronic respiratory failure with hypoxia: Secondary | ICD-10-CM | POA: Diagnosis not present

## 2016-10-02 DIAGNOSIS — I25708 Atherosclerosis of coronary artery bypass graft(s), unspecified, with other forms of angina pectoris: Secondary | ICD-10-CM | POA: Diagnosis not present

## 2016-10-02 DIAGNOSIS — N183 Chronic kidney disease, stage 3 (moderate): Secondary | ICD-10-CM | POA: Diagnosis not present

## 2016-10-02 DIAGNOSIS — E1122 Type 2 diabetes mellitus with diabetic chronic kidney disease: Secondary | ICD-10-CM | POA: Diagnosis not present

## 2016-10-02 DIAGNOSIS — R531 Weakness: Secondary | ICD-10-CM | POA: Diagnosis not present

## 2016-10-02 DIAGNOSIS — Z9181 History of falling: Secondary | ICD-10-CM | POA: Diagnosis not present

## 2016-10-02 DIAGNOSIS — I5032 Chronic diastolic (congestive) heart failure: Secondary | ICD-10-CM | POA: Diagnosis not present

## 2016-10-02 DIAGNOSIS — E1121 Type 2 diabetes mellitus with diabetic nephropathy: Secondary | ICD-10-CM | POA: Diagnosis not present

## 2016-10-02 DIAGNOSIS — E785 Hyperlipidemia, unspecified: Secondary | ICD-10-CM | POA: Diagnosis not present

## 2016-10-02 DIAGNOSIS — Z9981 Dependence on supplemental oxygen: Secondary | ICD-10-CM | POA: Diagnosis not present

## 2016-10-02 DIAGNOSIS — Z7902 Long term (current) use of antithrombotics/antiplatelets: Secondary | ICD-10-CM | POA: Diagnosis not present

## 2016-10-02 DIAGNOSIS — Z7901 Long term (current) use of anticoagulants: Secondary | ICD-10-CM | POA: Diagnosis not present

## 2016-10-04 DIAGNOSIS — J9611 Chronic respiratory failure with hypoxia: Secondary | ICD-10-CM | POA: Diagnosis not present

## 2016-10-04 DIAGNOSIS — N183 Chronic kidney disease, stage 3 (moderate): Secondary | ICD-10-CM | POA: Diagnosis not present

## 2016-10-04 DIAGNOSIS — E1122 Type 2 diabetes mellitus with diabetic chronic kidney disease: Secondary | ICD-10-CM | POA: Diagnosis not present

## 2016-10-04 DIAGNOSIS — I5032 Chronic diastolic (congestive) heart failure: Secondary | ICD-10-CM | POA: Diagnosis not present

## 2016-10-04 DIAGNOSIS — I130 Hypertensive heart and chronic kidney disease with heart failure and stage 1 through stage 4 chronic kidney disease, or unspecified chronic kidney disease: Secondary | ICD-10-CM | POA: Diagnosis not present

## 2016-10-04 DIAGNOSIS — E1121 Type 2 diabetes mellitus with diabetic nephropathy: Secondary | ICD-10-CM | POA: Diagnosis not present

## 2016-10-05 ENCOUNTER — Telehealth: Payer: Self-pay

## 2016-10-05 DIAGNOSIS — I5032 Chronic diastolic (congestive) heart failure: Secondary | ICD-10-CM | POA: Diagnosis not present

## 2016-10-05 DIAGNOSIS — E1121 Type 2 diabetes mellitus with diabetic nephropathy: Secondary | ICD-10-CM | POA: Diagnosis not present

## 2016-10-05 DIAGNOSIS — J9611 Chronic respiratory failure with hypoxia: Secondary | ICD-10-CM | POA: Diagnosis not present

## 2016-10-05 DIAGNOSIS — I130 Hypertensive heart and chronic kidney disease with heart failure and stage 1 through stage 4 chronic kidney disease, or unspecified chronic kidney disease: Secondary | ICD-10-CM | POA: Diagnosis not present

## 2016-10-05 DIAGNOSIS — E1122 Type 2 diabetes mellitus with diabetic chronic kidney disease: Secondary | ICD-10-CM | POA: Diagnosis not present

## 2016-10-05 DIAGNOSIS — N183 Chronic kidney disease, stage 3 (moderate): Secondary | ICD-10-CM | POA: Diagnosis not present

## 2016-10-05 NOTE — Telephone Encounter (Signed)
Stacey from [facility] called to request a refill on patients Rx Ativan 0.25 mg Last OV was 07/21/2016 Next OV is on 10/25/2016 Last refilled 08/03/2016 for 1 mg  Please advise Rx strength and send Rx to [facility] [phone]

## 2016-10-06 DIAGNOSIS — E1121 Type 2 diabetes mellitus with diabetic nephropathy: Secondary | ICD-10-CM | POA: Diagnosis not present

## 2016-10-06 DIAGNOSIS — N183 Chronic kidney disease, stage 3 (moderate): Secondary | ICD-10-CM | POA: Diagnosis not present

## 2016-10-06 DIAGNOSIS — I5032 Chronic diastolic (congestive) heart failure: Secondary | ICD-10-CM | POA: Diagnosis not present

## 2016-10-06 DIAGNOSIS — E1122 Type 2 diabetes mellitus with diabetic chronic kidney disease: Secondary | ICD-10-CM | POA: Diagnosis not present

## 2016-10-06 DIAGNOSIS — I130 Hypertensive heart and chronic kidney disease with heart failure and stage 1 through stage 4 chronic kidney disease, or unspecified chronic kidney disease: Secondary | ICD-10-CM | POA: Diagnosis not present

## 2016-10-06 DIAGNOSIS — J9611 Chronic respiratory failure with hypoxia: Secondary | ICD-10-CM | POA: Diagnosis not present

## 2016-10-06 MED ORDER — LORAZEPAM 0.5 MG PO TABS: 0.2500 mg | ORAL_TABLET | Freq: Every day | ORAL | 3 refills | Status: DC

## 2016-10-06 MED ORDER — LORAZEPAM 1 MG PO TABS: 1.0000 mg | ORAL_TABLET | Freq: Every day | ORAL | 3 refills | Status: DC

## 2016-10-06 NOTE — Addendum Note (Signed)
Addended by: Ria Bush on: 10/06/2016 02:39 PM   Modules accepted: Orders

## 2016-10-06 NOTE — Telephone Encounter (Addendum)
Spoke with [place] Board of hospice/palliative care Pt is on lorazepam 0.25mg  tablet at noon and 1mg  tablet at bedtime. I'm not sure where we got 0.25mg  dose as I don't remember ordering this, but [place] Board states pt is doing well with this dosing. Will continue.  Let's send in new lorazepam Rx as below: 0.25mg  at noon daily 1mg  at 10pm daily.  plz phone in to [facility] as per below.

## 2016-10-07 DIAGNOSIS — I5032 Chronic diastolic (congestive) heart failure: Secondary | ICD-10-CM | POA: Diagnosis not present

## 2016-10-07 DIAGNOSIS — N183 Chronic kidney disease, stage 3 (moderate): Secondary | ICD-10-CM | POA: Diagnosis not present

## 2016-10-07 DIAGNOSIS — J9611 Chronic respiratory failure with hypoxia: Secondary | ICD-10-CM | POA: Diagnosis not present

## 2016-10-07 DIAGNOSIS — E1122 Type 2 diabetes mellitus with diabetic chronic kidney disease: Secondary | ICD-10-CM | POA: Diagnosis not present

## 2016-10-07 DIAGNOSIS — E1121 Type 2 diabetes mellitus with diabetic nephropathy: Secondary | ICD-10-CM | POA: Diagnosis not present

## 2016-10-07 DIAGNOSIS — I130 Hypertensive heart and chronic kidney disease with heart failure and stage 1 through stage 4 chronic kidney disease, or unspecified chronic kidney disease: Secondary | ICD-10-CM | POA: Diagnosis not present

## 2016-10-07 MED ORDER — LORAZEPAM 0.5 MG PO TABS: 0.2500 mg | ORAL_TABLET | Freq: Every day | ORAL | 3 refills | Status: DC

## 2016-10-07 MED ORDER — LORAZEPAM 1 MG PO TABS: 1.0000 mg | ORAL_TABLET | Freq: Every day | ORAL | 3 refills | Status: DC

## 2016-10-07 NOTE — Addendum Note (Signed)
Addended by: Modena Nunnery on: 10/07/2016 10:41 AM   Modules accepted: Orders

## 2016-10-07 NOTE — Telephone Encounter (Signed)
Rx faxed to requested pharmacy 

## 2016-10-08 DIAGNOSIS — I5032 Chronic diastolic (congestive) heart failure: Secondary | ICD-10-CM | POA: Diagnosis not present

## 2016-10-08 DIAGNOSIS — J9611 Chronic respiratory failure with hypoxia: Secondary | ICD-10-CM | POA: Diagnosis not present

## 2016-10-08 DIAGNOSIS — I130 Hypertensive heart and chronic kidney disease with heart failure and stage 1 through stage 4 chronic kidney disease, or unspecified chronic kidney disease: Secondary | ICD-10-CM | POA: Diagnosis not present

## 2016-10-08 DIAGNOSIS — N183 Chronic kidney disease, stage 3 (moderate): Secondary | ICD-10-CM | POA: Diagnosis not present

## 2016-10-08 DIAGNOSIS — E1121 Type 2 diabetes mellitus with diabetic nephropathy: Secondary | ICD-10-CM | POA: Diagnosis not present

## 2016-10-08 DIAGNOSIS — E1122 Type 2 diabetes mellitus with diabetic chronic kidney disease: Secondary | ICD-10-CM | POA: Diagnosis not present

## 2016-10-11 DIAGNOSIS — E1121 Type 2 diabetes mellitus with diabetic nephropathy: Secondary | ICD-10-CM | POA: Diagnosis not present

## 2016-10-11 DIAGNOSIS — I5032 Chronic diastolic (congestive) heart failure: Secondary | ICD-10-CM | POA: Diagnosis not present

## 2016-10-11 DIAGNOSIS — N183 Chronic kidney disease, stage 3 (moderate): Secondary | ICD-10-CM | POA: Diagnosis not present

## 2016-10-11 DIAGNOSIS — J9611 Chronic respiratory failure with hypoxia: Secondary | ICD-10-CM | POA: Diagnosis not present

## 2016-10-11 DIAGNOSIS — E1122 Type 2 diabetes mellitus with diabetic chronic kidney disease: Secondary | ICD-10-CM | POA: Diagnosis not present

## 2016-10-11 DIAGNOSIS — I130 Hypertensive heart and chronic kidney disease with heart failure and stage 1 through stage 4 chronic kidney disease, or unspecified chronic kidney disease: Secondary | ICD-10-CM | POA: Diagnosis not present

## 2016-10-12 DIAGNOSIS — N183 Chronic kidney disease, stage 3 (moderate): Secondary | ICD-10-CM | POA: Diagnosis not present

## 2016-10-12 DIAGNOSIS — E1122 Type 2 diabetes mellitus with diabetic chronic kidney disease: Secondary | ICD-10-CM | POA: Diagnosis not present

## 2016-10-12 DIAGNOSIS — E1121 Type 2 diabetes mellitus with diabetic nephropathy: Secondary | ICD-10-CM | POA: Diagnosis not present

## 2016-10-12 DIAGNOSIS — I5032 Chronic diastolic (congestive) heart failure: Secondary | ICD-10-CM | POA: Diagnosis not present

## 2016-10-12 DIAGNOSIS — J9611 Chronic respiratory failure with hypoxia: Secondary | ICD-10-CM | POA: Diagnosis not present

## 2016-10-12 DIAGNOSIS — I130 Hypertensive heart and chronic kidney disease with heart failure and stage 1 through stage 4 chronic kidney disease, or unspecified chronic kidney disease: Secondary | ICD-10-CM | POA: Diagnosis not present

## 2016-10-13 DIAGNOSIS — J9611 Chronic respiratory failure with hypoxia: Secondary | ICD-10-CM | POA: Diagnosis not present

## 2016-10-13 DIAGNOSIS — N183 Chronic kidney disease, stage 3 (moderate): Secondary | ICD-10-CM | POA: Diagnosis not present

## 2016-10-13 DIAGNOSIS — E1122 Type 2 diabetes mellitus with diabetic chronic kidney disease: Secondary | ICD-10-CM | POA: Diagnosis not present

## 2016-10-13 DIAGNOSIS — E1121 Type 2 diabetes mellitus with diabetic nephropathy: Secondary | ICD-10-CM | POA: Diagnosis not present

## 2016-10-13 DIAGNOSIS — I130 Hypertensive heart and chronic kidney disease with heart failure and stage 1 through stage 4 chronic kidney disease, or unspecified chronic kidney disease: Secondary | ICD-10-CM | POA: Diagnosis not present

## 2016-10-13 DIAGNOSIS — I5032 Chronic diastolic (congestive) heart failure: Secondary | ICD-10-CM | POA: Diagnosis not present

## 2016-10-14 DIAGNOSIS — J9611 Chronic respiratory failure with hypoxia: Secondary | ICD-10-CM | POA: Diagnosis not present

## 2016-10-14 DIAGNOSIS — N183 Chronic kidney disease, stage 3 (moderate): Secondary | ICD-10-CM | POA: Diagnosis not present

## 2016-10-14 DIAGNOSIS — I130 Hypertensive heart and chronic kidney disease with heart failure and stage 1 through stage 4 chronic kidney disease, or unspecified chronic kidney disease: Secondary | ICD-10-CM | POA: Diagnosis not present

## 2016-10-14 DIAGNOSIS — E1121 Type 2 diabetes mellitus with diabetic nephropathy: Secondary | ICD-10-CM | POA: Diagnosis not present

## 2016-10-14 DIAGNOSIS — E1122 Type 2 diabetes mellitus with diabetic chronic kidney disease: Secondary | ICD-10-CM | POA: Diagnosis not present

## 2016-10-14 DIAGNOSIS — I5032 Chronic diastolic (congestive) heart failure: Secondary | ICD-10-CM | POA: Diagnosis not present

## 2016-10-15 DIAGNOSIS — I130 Hypertensive heart and chronic kidney disease with heart failure and stage 1 through stage 4 chronic kidney disease, or unspecified chronic kidney disease: Secondary | ICD-10-CM | POA: Diagnosis not present

## 2016-10-15 DIAGNOSIS — E1121 Type 2 diabetes mellitus with diabetic nephropathy: Secondary | ICD-10-CM | POA: Diagnosis not present

## 2016-10-15 DIAGNOSIS — I5032 Chronic diastolic (congestive) heart failure: Secondary | ICD-10-CM | POA: Diagnosis not present

## 2016-10-15 DIAGNOSIS — J9611 Chronic respiratory failure with hypoxia: Secondary | ICD-10-CM | POA: Diagnosis not present

## 2016-10-15 DIAGNOSIS — N183 Chronic kidney disease, stage 3 (moderate): Secondary | ICD-10-CM | POA: Diagnosis not present

## 2016-10-15 DIAGNOSIS — E1122 Type 2 diabetes mellitus with diabetic chronic kidney disease: Secondary | ICD-10-CM | POA: Diagnosis not present

## 2016-10-18 DIAGNOSIS — J9611 Chronic respiratory failure with hypoxia: Secondary | ICD-10-CM | POA: Diagnosis not present

## 2016-10-18 DIAGNOSIS — I5032 Chronic diastolic (congestive) heart failure: Secondary | ICD-10-CM | POA: Diagnosis not present

## 2016-10-18 DIAGNOSIS — E1121 Type 2 diabetes mellitus with diabetic nephropathy: Secondary | ICD-10-CM | POA: Diagnosis not present

## 2016-10-18 DIAGNOSIS — N183 Chronic kidney disease, stage 3 (moderate): Secondary | ICD-10-CM | POA: Diagnosis not present

## 2016-10-18 DIAGNOSIS — E1122 Type 2 diabetes mellitus with diabetic chronic kidney disease: Secondary | ICD-10-CM | POA: Diagnosis not present

## 2016-10-18 DIAGNOSIS — I130 Hypertensive heart and chronic kidney disease with heart failure and stage 1 through stage 4 chronic kidney disease, or unspecified chronic kidney disease: Secondary | ICD-10-CM | POA: Diagnosis not present

## 2016-10-19 DIAGNOSIS — E1121 Type 2 diabetes mellitus with diabetic nephropathy: Secondary | ICD-10-CM | POA: Diagnosis not present

## 2016-10-19 DIAGNOSIS — E1122 Type 2 diabetes mellitus with diabetic chronic kidney disease: Secondary | ICD-10-CM | POA: Diagnosis not present

## 2016-10-19 DIAGNOSIS — J9611 Chronic respiratory failure with hypoxia: Secondary | ICD-10-CM | POA: Diagnosis not present

## 2016-10-19 DIAGNOSIS — I130 Hypertensive heart and chronic kidney disease with heart failure and stage 1 through stage 4 chronic kidney disease, or unspecified chronic kidney disease: Secondary | ICD-10-CM | POA: Diagnosis not present

## 2016-10-19 DIAGNOSIS — I5032 Chronic diastolic (congestive) heart failure: Secondary | ICD-10-CM | POA: Diagnosis not present

## 2016-10-19 DIAGNOSIS — N183 Chronic kidney disease, stage 3 (moderate): Secondary | ICD-10-CM | POA: Diagnosis not present

## 2016-10-19 LAB — HM DIABETES EYE EXAM

## 2016-10-20 DIAGNOSIS — J9611 Chronic respiratory failure with hypoxia: Secondary | ICD-10-CM | POA: Diagnosis not present

## 2016-10-20 DIAGNOSIS — I130 Hypertensive heart and chronic kidney disease with heart failure and stage 1 through stage 4 chronic kidney disease, or unspecified chronic kidney disease: Secondary | ICD-10-CM | POA: Diagnosis not present

## 2016-10-20 DIAGNOSIS — E1122 Type 2 diabetes mellitus with diabetic chronic kidney disease: Secondary | ICD-10-CM | POA: Diagnosis not present

## 2016-10-20 DIAGNOSIS — E119 Type 2 diabetes mellitus without complications: Secondary | ICD-10-CM | POA: Diagnosis not present

## 2016-10-20 DIAGNOSIS — I5032 Chronic diastolic (congestive) heart failure: Secondary | ICD-10-CM | POA: Diagnosis not present

## 2016-10-20 DIAGNOSIS — N183 Chronic kidney disease, stage 3 (moderate): Secondary | ICD-10-CM | POA: Diagnosis not present

## 2016-10-20 DIAGNOSIS — E1121 Type 2 diabetes mellitus with diabetic nephropathy: Secondary | ICD-10-CM | POA: Diagnosis not present

## 2016-10-21 ENCOUNTER — Encounter: Payer: Self-pay | Admitting: Family Medicine

## 2016-10-21 DIAGNOSIS — E1122 Type 2 diabetes mellitus with diabetic chronic kidney disease: Secondary | ICD-10-CM | POA: Diagnosis not present

## 2016-10-21 DIAGNOSIS — J9611 Chronic respiratory failure with hypoxia: Secondary | ICD-10-CM | POA: Diagnosis not present

## 2016-10-21 DIAGNOSIS — N183 Chronic kidney disease, stage 3 (moderate): Secondary | ICD-10-CM | POA: Diagnosis not present

## 2016-10-21 DIAGNOSIS — E1121 Type 2 diabetes mellitus with diabetic nephropathy: Secondary | ICD-10-CM | POA: Diagnosis not present

## 2016-10-21 DIAGNOSIS — I5032 Chronic diastolic (congestive) heart failure: Secondary | ICD-10-CM | POA: Diagnosis not present

## 2016-10-21 DIAGNOSIS — I130 Hypertensive heart and chronic kidney disease with heart failure and stage 1 through stage 4 chronic kidney disease, or unspecified chronic kidney disease: Secondary | ICD-10-CM | POA: Diagnosis not present

## 2016-10-22 DIAGNOSIS — I5032 Chronic diastolic (congestive) heart failure: Secondary | ICD-10-CM | POA: Diagnosis not present

## 2016-10-22 DIAGNOSIS — I130 Hypertensive heart and chronic kidney disease with heart failure and stage 1 through stage 4 chronic kidney disease, or unspecified chronic kidney disease: Secondary | ICD-10-CM | POA: Diagnosis not present

## 2016-10-22 DIAGNOSIS — E1121 Type 2 diabetes mellitus with diabetic nephropathy: Secondary | ICD-10-CM | POA: Diagnosis not present

## 2016-10-22 DIAGNOSIS — N183 Chronic kidney disease, stage 3 (moderate): Secondary | ICD-10-CM | POA: Diagnosis not present

## 2016-10-22 DIAGNOSIS — J9611 Chronic respiratory failure with hypoxia: Secondary | ICD-10-CM | POA: Diagnosis not present

## 2016-10-22 DIAGNOSIS — E1122 Type 2 diabetes mellitus with diabetic chronic kidney disease: Secondary | ICD-10-CM | POA: Diagnosis not present

## 2016-10-25 ENCOUNTER — Ambulatory Visit (INDEPENDENT_AMBULATORY_CARE_PROVIDER_SITE_OTHER): Payer: Medicare Other | Admitting: Family Medicine

## 2016-10-25 ENCOUNTER — Encounter: Payer: Self-pay | Admitting: Family Medicine

## 2016-10-25 VITALS — BP 106/64 | HR 65 | Temp 98.0°F | Wt 134.2 lb

## 2016-10-25 DIAGNOSIS — I10 Essential (primary) hypertension: Secondary | ICD-10-CM

## 2016-10-25 DIAGNOSIS — I25708 Atherosclerosis of coronary artery bypass graft(s), unspecified, with other forms of angina pectoris: Secondary | ICD-10-CM

## 2016-10-25 DIAGNOSIS — Z515 Encounter for palliative care: Secondary | ICD-10-CM | POA: Diagnosis not present

## 2016-10-25 DIAGNOSIS — N183 Chronic kidney disease, stage 3 (moderate): Secondary | ICD-10-CM | POA: Diagnosis not present

## 2016-10-25 DIAGNOSIS — E1121 Type 2 diabetes mellitus with diabetic nephropathy: Secondary | ICD-10-CM | POA: Diagnosis not present

## 2016-10-25 DIAGNOSIS — E1122 Type 2 diabetes mellitus with diabetic chronic kidney disease: Secondary | ICD-10-CM | POA: Diagnosis not present

## 2016-10-25 DIAGNOSIS — Z23 Encounter for immunization: Secondary | ICD-10-CM | POA: Diagnosis not present

## 2016-10-25 DIAGNOSIS — R1013 Epigastric pain: Secondary | ICD-10-CM | POA: Diagnosis not present

## 2016-10-25 DIAGNOSIS — J9611 Chronic respiratory failure with hypoxia: Secondary | ICD-10-CM | POA: Diagnosis not present

## 2016-10-25 DIAGNOSIS — Z66 Do not resuscitate: Secondary | ICD-10-CM | POA: Diagnosis not present

## 2016-10-25 DIAGNOSIS — K219 Gastro-esophageal reflux disease without esophagitis: Secondary | ICD-10-CM | POA: Diagnosis not present

## 2016-10-25 DIAGNOSIS — I5032 Chronic diastolic (congestive) heart failure: Secondary | ICD-10-CM | POA: Diagnosis not present

## 2016-10-25 DIAGNOSIS — I130 Hypertensive heart and chronic kidney disease with heart failure and stage 1 through stage 4 chronic kidney disease, or unspecified chronic kidney disease: Secondary | ICD-10-CM | POA: Diagnosis not present

## 2016-10-25 NOTE — Assessment & Plan Note (Signed)
Deteriorated despite regular aciphex use, no improvement with addition of zantac 150mg  nightly. Will change PPI to esomeprazole 40mg  daily and update with effect.

## 2016-10-25 NOTE — Progress Notes (Signed)
BP 106/64 (BP Location: Left Arm, Patient Position: Sitting, Cuff Size: Normal)   Pulse 65   Temp 98 F (36.7 C) (Oral)   Wt 134 lb 4 oz (60.9 kg)   SpO2 98% Comment: 2 L  BMI 22.69 kg/m    CC: 3 mo f/u visit Subjective:    Patient ID: Christine Blanchard, female    DOB: 03-22-1928, 83 y.o.   MRN: [id number]  HPI: Christine Blanchard is a 83 y.o. female presenting on 10/25/2016 for 3 mo follow-up (Has Physician Communication form to be completed)   Here with daughter [name] today. Hospice patient.   She continues tramadol '50mg'$  BID (3pm and 10pm) and lorazepam 0.'25mg'$  at noon, 1pm at bedtime. Wakes up with pain from chest to mid abdomen, described as pressure pain. No constipation or bowel changes. This is despite SL nitro once daily. She also continues tylenol scheduled TID  GERD - on aciphex '20mg'$  daily (chronic medidcation) and zantac '150mg'$  at bedtime. Persistent significant GERD sxs. Worse over last 3 months.   Has decided to stop seeing cardiology as she's now on hospice.   Relevant past medical, surgical, family and social history reviewed and updated as indicated. Interim medical history since our last visit reviewed. Allergies and medications reviewed and updated. Outpatient Medications Prior to Visit  Medication Sig Dispense Refill  . acetaminophen (TYLENOL) 500 MG tablet Take 500 mg by mouth 3 (three) times daily.    . ASPIRIN LOW DOSE 81 MG chewable tablet TAKE 1 TABLET BY MOUTH ONCE DAILY 30 tablet 6  . atorvastatin (LIPITOR) 20 MG tablet TAKE 1 TABLET NIGHTLY 90 tablet 1  . BD AUTOSHIELD DUO 30G X 5 MM MISC Use as instructed with insulin Dx: E11.21 100 each 3  . cetirizine (ZYRTEC) 10 MG tablet Take 1 tablet (10 mg total) by mouth daily as needed for allergies. 30 tablet 5  . clopidogrel (PLAVIX) 75 MG tablet TAKE 1 TABLET DAILY 90 tablet 3  . docusate sodium (COLACE) 100 MG capsule Take 1 capsule (100 mg total) by mouth daily. 90 capsule 1  . glucose blood (FREESTYLE LITE) test  strip Use to check sugar three times daily Dx: 250.40 100 each 3  . glucose monitoring kit (FREESTYLE) monitoring kit 1 each by Does not apply route as needed for other. 1 each 0  . isosorbide mononitrate (IMDUR) 30 MG 24 hr tablet TAKE 1 TABLET AT BEDTIME 90 tablet 3  . KLOR-CON M10 10 MEQ tablet TAKE 2 TABLETS (20 MEQ TOTAL) DAILY 180 tablet 1  . Lancets (FREESTYLE) lancets USE TO CHECK SUGAR THREE TIMES A DAY 300 each 2  . LANTUS SOLOSTAR 100 UNIT/ML Solostar Pen INJECT 10 UNITS UNDER THE SKIN DAILY WITH BREAKFAST (DIRECTION CHANGE) 15 mL 6  . LORazepam (ATIVAN) 0.5 MG tablet Take 0.5 tablets (0.25 mg total) by mouth daily. At noon 30 tablet 3  . LORazepam (ATIVAN) 1 MG tablet Take 1 tablet (1 mg total) by mouth at bedtime. At 10pm 30 tablet 3  . magnesium hydroxide (MILK OF MAGNESIA) 800 MG/5ML suspension Take by mouth daily as needed for constipation.    . meclizine (ANTIVERT) 12.5 MG tablet One tablet by mouth daily as needed for vertigo 30 tablet 0  . metFORMIN (GLUCOPHAGE-XR) 500 MG 24 hr tablet Take 1 tablet (500 mg total) by mouth at bedtime. 90 tablet 3  . metoprolol tartrate (LOPRESSOR) 25 MG tablet TAKE 1 TABLET TWICE A DAY 180 tablet 3  . Multiple Vitamin (  MULTIVITAMIN) capsule Take 1 capsule by mouth daily.      . nitroGLYCERIN (NITROSTAT) 0.4 MG SL tablet DISSOLVE 1 TABLET UNDER THE TONGUE AS NEEDED 20 tablet 1  . ondansetron (ZOFRAN) 4 MG tablet Take 1 tablet (4 mg total) by mouth 2 (two) times daily as needed for nausea or vomiting. 30 tablet 1  . prednisoLONE sodium phosphate (INFLAMASE FORTE) 1 % ophthalmic solution Place 1 drop into both eyes daily. 5 mL 3  . RABEprazole (ACIPHEX) 20 MG tablet TAKE 1 TABLET DAILY 90 tablet 1  . ranitidine (ZANTAC) 150 MG tablet Take 1 tablet (150 mg total) by mouth at bedtime. 90 tablet 1  . sertraline (ZOLOFT) 50 MG tablet Take 1 tablet (50 mg total) by mouth daily. 90 tablet 3  . SPIRIVA HANDIHALER 18 MCG inhalation capsule INHALE THE  CONTENTS OF 1 CAPSULE VIA HANDIHALER DAILY 90 capsule 2  . torsemide (DEMADEX) 20 MG tablet TAKE 2 TABLETS EVERY MORNING AND 1 EXTRA TABLET ON MONDAY, WEDNESDAY, AND FRIDAY IF NEEDED 225 tablet 1  . traMADol (ULTRAM) 50 MG tablet TAKE ONE TABLET BY MOUTH 2 TIMES A DAY AT 1:30PM AND 10PM 60 tablet 3  . traZODone (DESYREL) 50 MG tablet TAKE ONE-HALF (1/2) TO 1 TABLET AT BEDTIME AS NEEDED FOR SLEEP 90 tablet 1  . Vitamin D, Ergocalciferol, (DRISDOL) 50000 units CAPS capsule TAKE 1 CAPSULE EVERY WEEK 12 capsule 1   No facility-administered medications prior to visit.      Per HPI unless specifically indicated in ROS section below Review of Systems     Objective:    BP 106/64 (BP Location: Left Arm, Patient Position: Sitting, Cuff Size: Normal)   Pulse 65   Temp 98 F (36.7 C) (Oral)   Wt 134 lb 4 oz (60.9 kg)   SpO2 98% Comment: 2 L  BMI 22.69 kg/m   Wt Readings from Last 3 Encounters:  10/25/16 134 lb 4 oz (60.9 kg)  07/21/16 131 lb 8 oz (59.6 kg)  05/24/16 126 lb (57.2 kg)    Physical Exam  Constitutional: She appears well-developed and well-nourished. No distress.  HENT:  Mouth/Throat: Oropharynx is clear and moist. No oropharyngeal exudate.  Cardiovascular: Normal rate, regular rhythm, normal heart sounds and intact distal pulses.   No murmur heard. Pulmonary/Chest: Effort normal and breath sounds normal. No respiratory distress. She has no wheezes. She has no rales.  Musculoskeletal: She exhibits no edema.  Skin: Skin is warm and dry. No rash noted.  Psychiatric: She has a normal mood and affect.  Nursing note and vitals reviewed.  Results for orders placed or performed in visit on 10/21/16  HM DIABETES EYE EXAM  Result Value Ref Range   HM Diabetic Eye Exam No Retinopathy No Retinopathy   Lab Results  Component Value Date   HGBA1C 7.4 (H) 07/21/2016       Assessment & Plan:   Problem List Items Addressed This Visit    Abdominal pain    Further clarified to mean  chest and abdominal pressure/tightness concerning for anginal pain.      CAD (coronary artery disease) of artery bypass graft - Primary   DNR (do not resuscitate)   Essential hypertension    Chronic. Low readings recently limits med use. Will regardless trial higher imdur dose due to persistent chest pain concerning for anginal pain.      GERD (gastroesophageal reflux disease)    Deteriorated despite regular aciphex use, no improvement with addition of zantac 147m  nightly. Will change PPI to esomeprazole 52m daily and update with effect.       Hospice care patient       Follow up plan: Return in about 3 months (around 01/24/2017), or if symptoms worsen or fail to improve, for follow up visit.  JRia Bush MD

## 2016-10-25 NOTE — Addendum Note (Signed)
Addended by: Brenton Grills on: 6/65/9935 70:17 AM   Modules accepted: Orders

## 2016-10-25 NOTE — Patient Instructions (Addendum)
Flu shot today.  For reflux: stop aciphex and start nexium 40mg  daily, continue zantac at night time.  For chest pain: increase isosorbide to 60mg  nightly.  Increase noon lorazepam to 0.5mg  dose.  Let me know how you do with these medicine changes.  Return in 3 months for follow up visit.

## 2016-10-25 NOTE — Assessment & Plan Note (Signed)
Further clarified to mean chest and abdominal pressure/tightness concerning for anginal pain.

## 2016-10-25 NOTE — Assessment & Plan Note (Signed)
Chronic. Low readings recently limits med use. Will regardless trial higher imdur dose due to persistent chest pain concerning for anginal pain.

## 2016-10-26 DIAGNOSIS — E1121 Type 2 diabetes mellitus with diabetic nephropathy: Secondary | ICD-10-CM | POA: Diagnosis not present

## 2016-10-26 DIAGNOSIS — I130 Hypertensive heart and chronic kidney disease with heart failure and stage 1 through stage 4 chronic kidney disease, or unspecified chronic kidney disease: Secondary | ICD-10-CM | POA: Diagnosis not present

## 2016-10-26 DIAGNOSIS — N183 Chronic kidney disease, stage 3 (moderate): Secondary | ICD-10-CM | POA: Diagnosis not present

## 2016-10-26 DIAGNOSIS — I5032 Chronic diastolic (congestive) heart failure: Secondary | ICD-10-CM | POA: Diagnosis not present

## 2016-10-26 DIAGNOSIS — E1122 Type 2 diabetes mellitus with diabetic chronic kidney disease: Secondary | ICD-10-CM | POA: Diagnosis not present

## 2016-10-26 DIAGNOSIS — J9611 Chronic respiratory failure with hypoxia: Secondary | ICD-10-CM | POA: Diagnosis not present

## 2016-10-27 ENCOUNTER — Telehealth: Payer: Self-pay

## 2016-10-27 DIAGNOSIS — I130 Hypertensive heart and chronic kidney disease with heart failure and stage 1 through stage 4 chronic kidney disease, or unspecified chronic kidney disease: Secondary | ICD-10-CM | POA: Diagnosis not present

## 2016-10-27 DIAGNOSIS — E1122 Type 2 diabetes mellitus with diabetic chronic kidney disease: Secondary | ICD-10-CM | POA: Diagnosis not present

## 2016-10-27 DIAGNOSIS — J9611 Chronic respiratory failure with hypoxia: Secondary | ICD-10-CM | POA: Diagnosis not present

## 2016-10-27 DIAGNOSIS — N183 Chronic kidney disease, stage 3 (moderate): Secondary | ICD-10-CM | POA: Diagnosis not present

## 2016-10-27 DIAGNOSIS — I5032 Chronic diastolic (congestive) heart failure: Secondary | ICD-10-CM | POA: Diagnosis not present

## 2016-10-27 DIAGNOSIS — E1121 Type 2 diabetes mellitus with diabetic nephropathy: Secondary | ICD-10-CM | POA: Diagnosis not present

## 2016-10-27 MED ORDER — ISOSORBIDE MONONITRATE ER 60 MG PO TB24: 60.0000 mg | ORAL_TABLET | Freq: Every day | ORAL | 1 refills | Status: DC

## 2016-10-27 MED ORDER — PANTOPRAZOLE SODIUM 40 MG PO TBEC: 40.0000 mg | DELAYED_RELEASE_TABLET | Freq: Every day | ORAL | 1 refills | Status: DC

## 2016-10-27 NOTE — Telephone Encounter (Signed)
Actually, nexium will interact with plavix so I recommend we try protonix 40mg  daily in its place. Rx printed [facility]. plz notify Sheketha of change from nexium to protonix.

## 2016-10-27 NOTE — Telephone Encounter (Signed)
Sheketha at [facility] said that pt was seen 10/25/16 and pt started on Nexium 40 mg and isosorbide was increased to 60 mg nightly. Sheketha at [facility] rx for isosorbide 60 mg and Nexium 40 mg [facility] at fax # [phone].Please advise.

## 2016-10-27 NOTE — Telephone Encounter (Signed)
Faxed rxs to [facility].

## 2016-10-28 ENCOUNTER — Other Ambulatory Visit: Payer: Self-pay

## 2016-10-28 DIAGNOSIS — I5032 Chronic diastolic (congestive) heart failure: Secondary | ICD-10-CM | POA: Diagnosis not present

## 2016-10-28 DIAGNOSIS — J9611 Chronic respiratory failure with hypoxia: Secondary | ICD-10-CM | POA: Diagnosis not present

## 2016-10-28 DIAGNOSIS — E1121 Type 2 diabetes mellitus with diabetic nephropathy: Secondary | ICD-10-CM | POA: Diagnosis not present

## 2016-10-28 DIAGNOSIS — N183 Chronic kidney disease, stage 3 (moderate): Secondary | ICD-10-CM | POA: Diagnosis not present

## 2016-10-28 DIAGNOSIS — E1122 Type 2 diabetes mellitus with diabetic chronic kidney disease: Secondary | ICD-10-CM | POA: Diagnosis not present

## 2016-10-28 DIAGNOSIS — I130 Hypertensive heart and chronic kidney disease with heart failure and stage 1 through stage 4 chronic kidney disease, or unspecified chronic kidney disease: Secondary | ICD-10-CM | POA: Diagnosis not present

## 2016-10-28 NOTE — Telephone Encounter (Signed)
Last filled 06/23/16, #60/3 Last OV:  10/25/16 Next OV:  None hospice

## 2016-10-29 DIAGNOSIS — I130 Hypertensive heart and chronic kidney disease with heart failure and stage 1 through stage 4 chronic kidney disease, or unspecified chronic kidney disease: Secondary | ICD-10-CM | POA: Diagnosis not present

## 2016-10-29 DIAGNOSIS — J9611 Chronic respiratory failure with hypoxia: Secondary | ICD-10-CM | POA: Diagnosis not present

## 2016-10-29 DIAGNOSIS — N183 Chronic kidney disease, stage 3 (moderate): Secondary | ICD-10-CM | POA: Diagnosis not present

## 2016-10-29 DIAGNOSIS — E1121 Type 2 diabetes mellitus with diabetic nephropathy: Secondary | ICD-10-CM | POA: Diagnosis not present

## 2016-10-29 DIAGNOSIS — I5032 Chronic diastolic (congestive) heart failure: Secondary | ICD-10-CM | POA: Diagnosis not present

## 2016-10-29 DIAGNOSIS — E1122 Type 2 diabetes mellitus with diabetic chronic kidney disease: Secondary | ICD-10-CM | POA: Diagnosis not present

## 2016-10-29 MED ORDER — TRAMADOL HCL 50 MG PO TABS: ORAL_TABLET | ORAL | 3 refills | Status: DC

## 2016-10-29 NOTE — Telephone Encounter (Signed)
Printed and in [place] box. plz fax to [facility].

## 2016-10-29 NOTE — Telephone Encounter (Signed)
Faxed rx to [facility] per Dr. Darnell Level.

## 2016-10-30 DIAGNOSIS — E1121 Type 2 diabetes mellitus with diabetic nephropathy: Secondary | ICD-10-CM | POA: Diagnosis not present

## 2016-10-30 DIAGNOSIS — E1122 Type 2 diabetes mellitus with diabetic chronic kidney disease: Secondary | ICD-10-CM | POA: Diagnosis not present

## 2016-10-30 DIAGNOSIS — I130 Hypertensive heart and chronic kidney disease with heart failure and stage 1 through stage 4 chronic kidney disease, or unspecified chronic kidney disease: Secondary | ICD-10-CM | POA: Diagnosis not present

## 2016-10-30 DIAGNOSIS — I5032 Chronic diastolic (congestive) heart failure: Secondary | ICD-10-CM | POA: Diagnosis not present

## 2016-10-30 DIAGNOSIS — N183 Chronic kidney disease, stage 3 (moderate): Secondary | ICD-10-CM | POA: Diagnosis not present

## 2016-10-30 DIAGNOSIS — J9611 Chronic respiratory failure with hypoxia: Secondary | ICD-10-CM | POA: Diagnosis not present

## 2016-11-01 DIAGNOSIS — Z9981 Dependence on supplemental oxygen: Secondary | ICD-10-CM | POA: Diagnosis not present

## 2016-11-01 DIAGNOSIS — E785 Hyperlipidemia, unspecified: Secondary | ICD-10-CM | POA: Diagnosis not present

## 2016-11-01 DIAGNOSIS — N183 Chronic kidney disease, stage 3 (moderate): Secondary | ICD-10-CM | POA: Diagnosis not present

## 2016-11-01 DIAGNOSIS — J9611 Chronic respiratory failure with hypoxia: Secondary | ICD-10-CM | POA: Diagnosis not present

## 2016-11-01 DIAGNOSIS — Z7984 Long term (current) use of oral hypoglycemic drugs: Secondary | ICD-10-CM | POA: Diagnosis not present

## 2016-11-01 DIAGNOSIS — I25708 Atherosclerosis of coronary artery bypass graft(s), unspecified, with other forms of angina pectoris: Secondary | ICD-10-CM | POA: Diagnosis not present

## 2016-11-01 DIAGNOSIS — I130 Hypertensive heart and chronic kidney disease with heart failure and stage 1 through stage 4 chronic kidney disease, or unspecified chronic kidney disease: Secondary | ICD-10-CM | POA: Diagnosis not present

## 2016-11-01 DIAGNOSIS — Z9181 History of falling: Secondary | ICD-10-CM | POA: Diagnosis not present

## 2016-11-01 DIAGNOSIS — E1121 Type 2 diabetes mellitus with diabetic nephropathy: Secondary | ICD-10-CM | POA: Diagnosis not present

## 2016-11-01 DIAGNOSIS — Z7901 Long term (current) use of anticoagulants: Secondary | ICD-10-CM | POA: Diagnosis not present

## 2016-11-01 DIAGNOSIS — I5032 Chronic diastolic (congestive) heart failure: Secondary | ICD-10-CM | POA: Diagnosis not present

## 2016-11-01 DIAGNOSIS — L03115 Cellulitis of right lower limb: Secondary | ICD-10-CM | POA: Diagnosis not present

## 2016-11-01 DIAGNOSIS — Z7902 Long term (current) use of antithrombotics/antiplatelets: Secondary | ICD-10-CM | POA: Diagnosis not present

## 2016-11-01 DIAGNOSIS — R531 Weakness: Secondary | ICD-10-CM | POA: Diagnosis not present

## 2016-11-01 DIAGNOSIS — E1122 Type 2 diabetes mellitus with diabetic chronic kidney disease: Secondary | ICD-10-CM | POA: Diagnosis not present

## 2016-11-02 DIAGNOSIS — E1121 Type 2 diabetes mellitus with diabetic nephropathy: Secondary | ICD-10-CM | POA: Diagnosis not present

## 2016-11-02 DIAGNOSIS — J9611 Chronic respiratory failure with hypoxia: Secondary | ICD-10-CM | POA: Diagnosis not present

## 2016-11-02 DIAGNOSIS — I25708 Atherosclerosis of coronary artery bypass graft(s), unspecified, with other forms of angina pectoris: Secondary | ICD-10-CM | POA: Diagnosis not present

## 2016-11-02 DIAGNOSIS — I130 Hypertensive heart and chronic kidney disease with heart failure and stage 1 through stage 4 chronic kidney disease, or unspecified chronic kidney disease: Secondary | ICD-10-CM | POA: Diagnosis not present

## 2016-11-02 DIAGNOSIS — N183 Chronic kidney disease, stage 3 (moderate): Secondary | ICD-10-CM | POA: Diagnosis not present

## 2016-11-02 DIAGNOSIS — E1122 Type 2 diabetes mellitus with diabetic chronic kidney disease: Secondary | ICD-10-CM | POA: Diagnosis not present

## 2016-11-02 NOTE — Telephone Encounter (Signed)
Sheketha from [place] received protonix [facility] but Kellie Moor needs written order to d/c Nexium and start protonix 40 mg taking one tab po daily faxed to [phone].

## 2016-11-02 NOTE — Telephone Encounter (Signed)
Sheketha from [facility] left v/m requesting cb about d/c order for nexium and order for protonix. I spoke with Melissa and advised Dr Darnell Level is seeing pt and will fax order when he reviews phone notes. Melissa said that would be OK.

## 2016-11-02 NOTE — Telephone Encounter (Signed)
Order written on paper script to fax to [facility]. Placed in [place] box.

## 2016-11-03 DIAGNOSIS — N183 Chronic kidney disease, stage 3 (moderate): Secondary | ICD-10-CM | POA: Diagnosis not present

## 2016-11-03 DIAGNOSIS — E1121 Type 2 diabetes mellitus with diabetic nephropathy: Secondary | ICD-10-CM | POA: Diagnosis not present

## 2016-11-03 DIAGNOSIS — I25708 Atherosclerosis of coronary artery bypass graft(s), unspecified, with other forms of angina pectoris: Secondary | ICD-10-CM | POA: Diagnosis not present

## 2016-11-03 DIAGNOSIS — E1122 Type 2 diabetes mellitus with diabetic chronic kidney disease: Secondary | ICD-10-CM | POA: Diagnosis not present

## 2016-11-03 DIAGNOSIS — I130 Hypertensive heart and chronic kidney disease with heart failure and stage 1 through stage 4 chronic kidney disease, or unspecified chronic kidney disease: Secondary | ICD-10-CM | POA: Diagnosis not present

## 2016-11-03 DIAGNOSIS — J9611 Chronic respiratory failure with hypoxia: Secondary | ICD-10-CM | POA: Diagnosis not present

## 2016-11-03 NOTE — Telephone Encounter (Signed)
Faxed rx for Protonix to [facility] at [phone].

## 2016-11-04 DIAGNOSIS — I25708 Atherosclerosis of coronary artery bypass graft(s), unspecified, with other forms of angina pectoris: Secondary | ICD-10-CM | POA: Diagnosis not present

## 2016-11-04 DIAGNOSIS — J9611 Chronic respiratory failure with hypoxia: Secondary | ICD-10-CM | POA: Diagnosis not present

## 2016-11-04 DIAGNOSIS — N183 Chronic kidney disease, stage 3 (moderate): Secondary | ICD-10-CM | POA: Diagnosis not present

## 2016-11-04 DIAGNOSIS — I130 Hypertensive heart and chronic kidney disease with heart failure and stage 1 through stage 4 chronic kidney disease, or unspecified chronic kidney disease: Secondary | ICD-10-CM | POA: Diagnosis not present

## 2016-11-04 DIAGNOSIS — E1122 Type 2 diabetes mellitus with diabetic chronic kidney disease: Secondary | ICD-10-CM | POA: Diagnosis not present

## 2016-11-04 DIAGNOSIS — E1121 Type 2 diabetes mellitus with diabetic nephropathy: Secondary | ICD-10-CM | POA: Diagnosis not present

## 2016-11-05 ENCOUNTER — Other Ambulatory Visit: Payer: Self-pay | Admitting: Family Medicine

## 2016-11-05 DIAGNOSIS — I25708 Atherosclerosis of coronary artery bypass graft(s), unspecified, with other forms of angina pectoris: Secondary | ICD-10-CM | POA: Diagnosis not present

## 2016-11-05 DIAGNOSIS — J9611 Chronic respiratory failure with hypoxia: Secondary | ICD-10-CM | POA: Diagnosis not present

## 2016-11-05 DIAGNOSIS — E1122 Type 2 diabetes mellitus with diabetic chronic kidney disease: Secondary | ICD-10-CM | POA: Diagnosis not present

## 2016-11-05 DIAGNOSIS — N183 Chronic kidney disease, stage 3 (moderate): Secondary | ICD-10-CM | POA: Diagnosis not present

## 2016-11-05 DIAGNOSIS — E1121 Type 2 diabetes mellitus with diabetic nephropathy: Secondary | ICD-10-CM | POA: Diagnosis not present

## 2016-11-05 DIAGNOSIS — I130 Hypertensive heart and chronic kidney disease with heart failure and stage 1 through stage 4 chronic kidney disease, or unspecified chronic kidney disease: Secondary | ICD-10-CM | POA: Diagnosis not present

## 2016-11-06 DIAGNOSIS — I130 Hypertensive heart and chronic kidney disease with heart failure and stage 1 through stage 4 chronic kidney disease, or unspecified chronic kidney disease: Secondary | ICD-10-CM | POA: Diagnosis not present

## 2016-11-06 DIAGNOSIS — I25708 Atherosclerosis of coronary artery bypass graft(s), unspecified, with other forms of angina pectoris: Secondary | ICD-10-CM | POA: Diagnosis not present

## 2016-11-06 DIAGNOSIS — E1121 Type 2 diabetes mellitus with diabetic nephropathy: Secondary | ICD-10-CM | POA: Diagnosis not present

## 2016-11-06 DIAGNOSIS — N183 Chronic kidney disease, stage 3 (moderate): Secondary | ICD-10-CM | POA: Diagnosis not present

## 2016-11-06 DIAGNOSIS — J9611 Chronic respiratory failure with hypoxia: Secondary | ICD-10-CM | POA: Diagnosis not present

## 2016-11-06 DIAGNOSIS — E1122 Type 2 diabetes mellitus with diabetic chronic kidney disease: Secondary | ICD-10-CM | POA: Diagnosis not present

## 2016-11-08 ENCOUNTER — Telehealth: Payer: Self-pay

## 2016-11-08 DIAGNOSIS — N183 Chronic kidney disease, stage 3 (moderate): Secondary | ICD-10-CM | POA: Diagnosis not present

## 2016-11-08 DIAGNOSIS — I25708 Atherosclerosis of coronary artery bypass graft(s), unspecified, with other forms of angina pectoris: Secondary | ICD-10-CM | POA: Diagnosis not present

## 2016-11-08 DIAGNOSIS — E1121 Type 2 diabetes mellitus with diabetic nephropathy: Secondary | ICD-10-CM | POA: Diagnosis not present

## 2016-11-08 DIAGNOSIS — E1122 Type 2 diabetes mellitus with diabetic chronic kidney disease: Secondary | ICD-10-CM | POA: Diagnosis not present

## 2016-11-08 DIAGNOSIS — I130 Hypertensive heart and chronic kidney disease with heart failure and stage 1 through stage 4 chronic kidney disease, or unspecified chronic kidney disease: Secondary | ICD-10-CM | POA: Diagnosis not present

## 2016-11-08 DIAGNOSIS — J9611 Chronic respiratory failure with hypoxia: Secondary | ICD-10-CM | POA: Diagnosis not present

## 2016-11-08 NOTE — Telephone Encounter (Signed)
Spoke with pt, says she did start abx. Says wound is draining pus.

## 2016-11-08 NOTE — Telephone Encounter (Signed)
PLEASE NOTE: All timestamps contained within this report are represented as [place] Standard Time. CONFIDENTIALTY NOTICE: This fax transmission is intended only for the addressee. It contains information that is legally privileged, confidential or otherwise protected from use or disclosure. If you are not the intended recipient, you are strictly prohibited from reviewing, disclosing, copying using or disseminating any of this information or taking any action in reliance on or regarding this information. If you have received this fax in error, please notify us immediately by telephone so that we can arrange for its return to [place]. Phone: [phone], Toll-Free: [phone], Fax: [phone] Page: 1 of 2 Call Id: [id number] [place] Patient Name: Christine Blanchard Gender: Female DOB: 11-Feb-1928 Age: 83 Y 74 M 18 D Return Phone Number: [phone] (Primary), [phone] (Secondary) Address: City/State/Zip: [place] Client [place] Night - Client Client Site [place] - Night Contact Type Call Who Is Calling Patient / Member / Family / Caregiver Call Type Triage / Clinical Caller Name Coralyn Mark Relationship To Patient Care Giver Return Phone Number [phone] (Secondary) Chief Complaint Wound Infection Reason for Call Symptomatic / Request for [place] states they are with [facility]. States they have a patient with a leg wound that she thinks is cellulitis. Would like to have an antibiotic called in. Translation No No Triage Reason Other Nurse Assessment Nurse: Lavera Guise, RN, Vaughan Basta Date/Time (Eastern Time): 11/05/2016 6:57:18 PM Confirm and document reason for call. If symptomatic, describe symptoms. ---Caller states she needs an antibiotic for the patient who has pitting edema with a wound infection on lower  lateral side of right leg. Oozing clear drainage, hot to touch, lump in center of the wound. Send medication [employer]. Nurse triage not indicated. On call with be contacted for the prescription. Does the patient have any new or worsening symptoms? ---Yes Will a triage be completed? ---No [facility] reason for no triage. ---Other Please document clinical information provided and list any resource used. ---Nurse triage not indicated. On call with be contacted for the prescription. Guidelines Guideline Title Affirmed Question Affirmed Notes Nurse Date/Time (Eastern Time) Disp. Time Eilene Ghazi Time) Disposition Final User 11/05/2016 6:56:28 PM Paged On Call back to [facility] [place] 11/05/2016 7:06:14 PM Call Completed Lavera Guise, RN, Vaughan Basta 11/05/2016 7:06:08 PM Clinical Call Yes Lavera Guise, RN, Yetta Numbers NOTE: All timestamps contained within this report are represented as [place] Standard Time. CONFIDENTIALTY NOTICE: This fax transmission is intended only for the addressee. It contains information that is legally privileged, confidential or otherwise protected from use or disclosure. If you are not the intended recipient, you are strictly prohibited from reviewing, disclosing, copying using or disseminating any of this information or taking any action in reliance on or regarding this information. If you have received this fax in error, please notify us immediately by telephone so that we can arrange for its return to [place]. Phone: [phone], Toll-Free: [phone], Fax: [phone] Page: 2 of 2 Call Id: [id number] Comments User: Ricard Dillon, RN Date/Time Eilene Ghazi Time): 11/05/2016 7:06:02 PM Connected caller with the on call Dr. as requested by the Dr. Sheran Lawless Phone DateTime Result/Outcome Message Type Notes Arnette Norris - MD [id number] 11/05/2016 6:56:28 PM Paged On Call Back to Call Center Doctor Paged Hi Dr. Please call Vaughan Basta, [place] concerning this  patient. Arnette Norris - MD 11/05/2016 7:00:25 PM Spoke with On Call -  General Message Result Dr. states nurse has protocol to follow. Dr. asked to speak with caller.

## 2016-11-08 NOTE — Telephone Encounter (Signed)
Can we call for f/u - was she started on abx? How is wound?

## 2016-11-08 NOTE — Telephone Encounter (Signed)
What antibiotic was started? If improving each day ok to monitor to resolution. If not improving recommend f/u for further eval.

## 2016-11-09 DIAGNOSIS — E1122 Type 2 diabetes mellitus with diabetic chronic kidney disease: Secondary | ICD-10-CM | POA: Diagnosis not present

## 2016-11-09 DIAGNOSIS — I25708 Atherosclerosis of coronary artery bypass graft(s), unspecified, with other forms of angina pectoris: Secondary | ICD-10-CM | POA: Diagnosis not present

## 2016-11-09 DIAGNOSIS — J9611 Chronic respiratory failure with hypoxia: Secondary | ICD-10-CM | POA: Diagnosis not present

## 2016-11-09 DIAGNOSIS — E1121 Type 2 diabetes mellitus with diabetic nephropathy: Secondary | ICD-10-CM | POA: Diagnosis not present

## 2016-11-09 DIAGNOSIS — N183 Chronic kidney disease, stage 3 (moderate): Secondary | ICD-10-CM | POA: Diagnosis not present

## 2016-11-09 DIAGNOSIS — I130 Hypertensive heart and chronic kidney disease with heart failure and stage 1 through stage 4 chronic kidney disease, or unspecified chronic kidney disease: Secondary | ICD-10-CM | POA: Diagnosis not present

## 2016-11-09 NOTE — Telephone Encounter (Signed)
Spoke with pt, says she will have to find out the name of the abx but does report the wound is looking better and seems to be getting better.

## 2016-11-09 NOTE — Telephone Encounter (Signed)
Christine Blanchard,Blakey Estrin called.  Patient's on Doxycycline 100 mg 1 x a day.  She said puss was coming out of the wound yesterday.  Dewaine Oats can be reached at [phone].

## 2016-11-10 DIAGNOSIS — I25708 Atherosclerosis of coronary artery bypass graft(s), unspecified, with other forms of angina pectoris: Secondary | ICD-10-CM | POA: Diagnosis not present

## 2016-11-10 DIAGNOSIS — I130 Hypertensive heart and chronic kidney disease with heart failure and stage 1 through stage 4 chronic kidney disease, or unspecified chronic kidney disease: Secondary | ICD-10-CM | POA: Diagnosis not present

## 2016-11-10 DIAGNOSIS — E1122 Type 2 diabetes mellitus with diabetic chronic kidney disease: Secondary | ICD-10-CM | POA: Diagnosis not present

## 2016-11-10 DIAGNOSIS — E1121 Type 2 diabetes mellitus with diabetic nephropathy: Secondary | ICD-10-CM | POA: Diagnosis not present

## 2016-11-10 DIAGNOSIS — N183 Chronic kidney disease, stage 3 (moderate): Secondary | ICD-10-CM | POA: Diagnosis not present

## 2016-11-10 DIAGNOSIS — J9611 Chronic respiratory failure with hypoxia: Secondary | ICD-10-CM | POA: Diagnosis not present

## 2016-11-11 DIAGNOSIS — E1121 Type 2 diabetes mellitus with diabetic nephropathy: Secondary | ICD-10-CM | POA: Diagnosis not present

## 2016-11-11 DIAGNOSIS — I25708 Atherosclerosis of coronary artery bypass graft(s), unspecified, with other forms of angina pectoris: Secondary | ICD-10-CM | POA: Diagnosis not present

## 2016-11-11 DIAGNOSIS — I130 Hypertensive heart and chronic kidney disease with heart failure and stage 1 through stage 4 chronic kidney disease, or unspecified chronic kidney disease: Secondary | ICD-10-CM | POA: Diagnosis not present

## 2016-11-11 DIAGNOSIS — N183 Chronic kidney disease, stage 3 (moderate): Secondary | ICD-10-CM | POA: Diagnosis not present

## 2016-11-11 DIAGNOSIS — E1122 Type 2 diabetes mellitus with diabetic chronic kidney disease: Secondary | ICD-10-CM | POA: Diagnosis not present

## 2016-11-11 DIAGNOSIS — J9611 Chronic respiratory failure with hypoxia: Secondary | ICD-10-CM | POA: Diagnosis not present

## 2016-11-12 DIAGNOSIS — I130 Hypertensive heart and chronic kidney disease with heart failure and stage 1 through stage 4 chronic kidney disease, or unspecified chronic kidney disease: Secondary | ICD-10-CM | POA: Diagnosis not present

## 2016-11-12 DIAGNOSIS — E1121 Type 2 diabetes mellitus with diabetic nephropathy: Secondary | ICD-10-CM | POA: Diagnosis not present

## 2016-11-12 DIAGNOSIS — I25708 Atherosclerosis of coronary artery bypass graft(s), unspecified, with other forms of angina pectoris: Secondary | ICD-10-CM | POA: Diagnosis not present

## 2016-11-12 DIAGNOSIS — J9611 Chronic respiratory failure with hypoxia: Secondary | ICD-10-CM | POA: Diagnosis not present

## 2016-11-12 DIAGNOSIS — E1122 Type 2 diabetes mellitus with diabetic chronic kidney disease: Secondary | ICD-10-CM | POA: Diagnosis not present

## 2016-11-12 DIAGNOSIS — N183 Chronic kidney disease, stage 3 (moderate): Secondary | ICD-10-CM | POA: Diagnosis not present

## 2016-11-15 DIAGNOSIS — E1122 Type 2 diabetes mellitus with diabetic chronic kidney disease: Secondary | ICD-10-CM | POA: Diagnosis not present

## 2016-11-15 DIAGNOSIS — E1121 Type 2 diabetes mellitus with diabetic nephropathy: Secondary | ICD-10-CM | POA: Diagnosis not present

## 2016-11-15 DIAGNOSIS — I25708 Atherosclerosis of coronary artery bypass graft(s), unspecified, with other forms of angina pectoris: Secondary | ICD-10-CM | POA: Diagnosis not present

## 2016-11-15 DIAGNOSIS — J9611 Chronic respiratory failure with hypoxia: Secondary | ICD-10-CM | POA: Diagnosis not present

## 2016-11-15 DIAGNOSIS — N183 Chronic kidney disease, stage 3 (moderate): Secondary | ICD-10-CM | POA: Diagnosis not present

## 2016-11-15 DIAGNOSIS — I130 Hypertensive heart and chronic kidney disease with heart failure and stage 1 through stage 4 chronic kidney disease, or unspecified chronic kidney disease: Secondary | ICD-10-CM | POA: Diagnosis not present

## 2016-11-16 DIAGNOSIS — E1121 Type 2 diabetes mellitus with diabetic nephropathy: Secondary | ICD-10-CM | POA: Diagnosis not present

## 2016-11-16 DIAGNOSIS — I25708 Atherosclerosis of coronary artery bypass graft(s), unspecified, with other forms of angina pectoris: Secondary | ICD-10-CM | POA: Diagnosis not present

## 2016-11-16 DIAGNOSIS — E1122 Type 2 diabetes mellitus with diabetic chronic kidney disease: Secondary | ICD-10-CM | POA: Diagnosis not present

## 2016-11-16 DIAGNOSIS — I130 Hypertensive heart and chronic kidney disease with heart failure and stage 1 through stage 4 chronic kidney disease, or unspecified chronic kidney disease: Secondary | ICD-10-CM | POA: Diagnosis not present

## 2016-11-16 DIAGNOSIS — N183 Chronic kidney disease, stage 3 (moderate): Secondary | ICD-10-CM | POA: Diagnosis not present

## 2016-11-16 DIAGNOSIS — J9611 Chronic respiratory failure with hypoxia: Secondary | ICD-10-CM | POA: Diagnosis not present

## 2016-11-17 ENCOUNTER — Telehealth: Payer: Self-pay

## 2016-11-17 DIAGNOSIS — E1122 Type 2 diabetes mellitus with diabetic chronic kidney disease: Secondary | ICD-10-CM | POA: Diagnosis not present

## 2016-11-17 DIAGNOSIS — E1121 Type 2 diabetes mellitus with diabetic nephropathy: Secondary | ICD-10-CM | POA: Diagnosis not present

## 2016-11-17 DIAGNOSIS — N183 Chronic kidney disease, stage 3 (moderate): Secondary | ICD-10-CM | POA: Diagnosis not present

## 2016-11-17 DIAGNOSIS — I130 Hypertensive heart and chronic kidney disease with heart failure and stage 1 through stage 4 chronic kidney disease, or unspecified chronic kidney disease: Secondary | ICD-10-CM | POA: Diagnosis not present

## 2016-11-17 DIAGNOSIS — I25708 Atherosclerosis of coronary artery bypass graft(s), unspecified, with other forms of angina pectoris: Secondary | ICD-10-CM | POA: Diagnosis not present

## 2016-11-17 DIAGNOSIS — J9611 Chronic respiratory failure with hypoxia: Secondary | ICD-10-CM | POA: Diagnosis not present

## 2016-11-17 NOTE — Telephone Encounter (Signed)
Melissa at [facility] request hard copy rx for tramadol (last printed # 60 x 3 on 10/29/16), Lorazepam 0.5 mg (last printed # 30 x 3 on 10/07/16) and Lorazepam 1 mg (last printed # 30 x 3 on 10/07/16). When I advised Melissa of this she said she just has to make sure the pt has her medicine. I advised Licensed convey[facility] team lead.

## 2016-11-18 DIAGNOSIS — E1122 Type 2 diabetes mellitus with diabetic chronic kidney disease: Secondary | ICD-10-CM | POA: Diagnosis not present

## 2016-11-18 DIAGNOSIS — J9611 Chronic respiratory failure with hypoxia: Secondary | ICD-10-CM | POA: Diagnosis not present

## 2016-11-18 DIAGNOSIS — E1121 Type 2 diabetes mellitus with diabetic nephropathy: Secondary | ICD-10-CM | POA: Diagnosis not present

## 2016-11-18 DIAGNOSIS — N183 Chronic kidney disease, stage 3 (moderate): Secondary | ICD-10-CM | POA: Diagnosis not present

## 2016-11-18 DIAGNOSIS — I25708 Atherosclerosis of coronary artery bypass graft(s), unspecified, with other forms of angina pectoris: Secondary | ICD-10-CM | POA: Diagnosis not present

## 2016-11-18 DIAGNOSIS — I130 Hypertensive heart and chronic kidney disease with heart failure and stage 1 through stage 4 chronic kidney disease, or unspecified chronic kidney disease: Secondary | ICD-10-CM | POA: Diagnosis not present

## 2016-11-19 DIAGNOSIS — I130 Hypertensive heart and chronic kidney disease with heart failure and stage 1 through stage 4 chronic kidney disease, or unspecified chronic kidney disease: Secondary | ICD-10-CM | POA: Diagnosis not present

## 2016-11-19 DIAGNOSIS — J9611 Chronic respiratory failure with hypoxia: Secondary | ICD-10-CM | POA: Diagnosis not present

## 2016-11-19 DIAGNOSIS — N183 Chronic kidney disease, stage 3 (moderate): Secondary | ICD-10-CM | POA: Diagnosis not present

## 2016-11-19 DIAGNOSIS — I25708 Atherosclerosis of coronary artery bypass graft(s), unspecified, with other forms of angina pectoris: Secondary | ICD-10-CM | POA: Diagnosis not present

## 2016-11-19 DIAGNOSIS — E1121 Type 2 diabetes mellitus with diabetic nephropathy: Secondary | ICD-10-CM | POA: Diagnosis not present

## 2016-11-19 DIAGNOSIS — E1122 Type 2 diabetes mellitus with diabetic chronic kidney disease: Secondary | ICD-10-CM | POA: Diagnosis not present

## 2016-11-21 DIAGNOSIS — E1121 Type 2 diabetes mellitus with diabetic nephropathy: Secondary | ICD-10-CM | POA: Diagnosis not present

## 2016-11-21 DIAGNOSIS — J9611 Chronic respiratory failure with hypoxia: Secondary | ICD-10-CM | POA: Diagnosis not present

## 2016-11-21 DIAGNOSIS — N183 Chronic kidney disease, stage 3 (moderate): Secondary | ICD-10-CM | POA: Diagnosis not present

## 2016-11-21 DIAGNOSIS — E1122 Type 2 diabetes mellitus with diabetic chronic kidney disease: Secondary | ICD-10-CM | POA: Diagnosis not present

## 2016-11-21 DIAGNOSIS — I25708 Atherosclerosis of coronary artery bypass graft(s), unspecified, with other forms of angina pectoris: Secondary | ICD-10-CM | POA: Diagnosis not present

## 2016-11-21 DIAGNOSIS — I130 Hypertensive heart and chronic kidney disease with heart failure and stage 1 through stage 4 chronic kidney disease, or unspecified chronic kidney disease: Secondary | ICD-10-CM | POA: Diagnosis not present

## 2016-11-22 DIAGNOSIS — N183 Chronic kidney disease, stage 3 (moderate): Secondary | ICD-10-CM | POA: Diagnosis not present

## 2016-11-22 DIAGNOSIS — I25708 Atherosclerosis of coronary artery bypass graft(s), unspecified, with other forms of angina pectoris: Secondary | ICD-10-CM | POA: Diagnosis not present

## 2016-11-22 DIAGNOSIS — E1121 Type 2 diabetes mellitus with diabetic nephropathy: Secondary | ICD-10-CM | POA: Diagnosis not present

## 2016-11-22 DIAGNOSIS — J9611 Chronic respiratory failure with hypoxia: Secondary | ICD-10-CM | POA: Diagnosis not present

## 2016-11-22 DIAGNOSIS — I130 Hypertensive heart and chronic kidney disease with heart failure and stage 1 through stage 4 chronic kidney disease, or unspecified chronic kidney disease: Secondary | ICD-10-CM | POA: Diagnosis not present

## 2016-11-22 DIAGNOSIS — E1122 Type 2 diabetes mellitus with diabetic chronic kidney disease: Secondary | ICD-10-CM | POA: Diagnosis not present

## 2016-11-23 DIAGNOSIS — I25708 Atherosclerosis of coronary artery bypass graft(s), unspecified, with other forms of angina pectoris: Secondary | ICD-10-CM | POA: Diagnosis not present

## 2016-11-23 DIAGNOSIS — J9611 Chronic respiratory failure with hypoxia: Secondary | ICD-10-CM | POA: Diagnosis not present

## 2016-11-23 DIAGNOSIS — E1121 Type 2 diabetes mellitus with diabetic nephropathy: Secondary | ICD-10-CM | POA: Diagnosis not present

## 2016-11-23 DIAGNOSIS — E1122 Type 2 diabetes mellitus with diabetic chronic kidney disease: Secondary | ICD-10-CM | POA: Diagnosis not present

## 2016-11-23 DIAGNOSIS — I130 Hypertensive heart and chronic kidney disease with heart failure and stage 1 through stage 4 chronic kidney disease, or unspecified chronic kidney disease: Secondary | ICD-10-CM | POA: Diagnosis not present

## 2016-11-23 DIAGNOSIS — N183 Chronic kidney disease, stage 3 (moderate): Secondary | ICD-10-CM | POA: Diagnosis not present

## 2016-11-24 ENCOUNTER — Other Ambulatory Visit: Payer: Self-pay | Admitting: Family Medicine

## 2016-11-24 DIAGNOSIS — I130 Hypertensive heart and chronic kidney disease with heart failure and stage 1 through stage 4 chronic kidney disease, or unspecified chronic kidney disease: Secondary | ICD-10-CM | POA: Diagnosis not present

## 2016-11-24 DIAGNOSIS — I25708 Atherosclerosis of coronary artery bypass graft(s), unspecified, with other forms of angina pectoris: Secondary | ICD-10-CM | POA: Diagnosis not present

## 2016-11-24 DIAGNOSIS — J9611 Chronic respiratory failure with hypoxia: Secondary | ICD-10-CM | POA: Diagnosis not present

## 2016-11-24 DIAGNOSIS — E1121 Type 2 diabetes mellitus with diabetic nephropathy: Secondary | ICD-10-CM | POA: Diagnosis not present

## 2016-11-24 DIAGNOSIS — N183 Chronic kidney disease, stage 3 (moderate): Secondary | ICD-10-CM | POA: Diagnosis not present

## 2016-11-24 DIAGNOSIS — E1122 Type 2 diabetes mellitus with diabetic chronic kidney disease: Secondary | ICD-10-CM | POA: Diagnosis not present

## 2016-11-25 DIAGNOSIS — E1121 Type 2 diabetes mellitus with diabetic nephropathy: Secondary | ICD-10-CM | POA: Diagnosis not present

## 2016-11-25 DIAGNOSIS — J9611 Chronic respiratory failure with hypoxia: Secondary | ICD-10-CM | POA: Diagnosis not present

## 2016-11-25 DIAGNOSIS — I25708 Atherosclerosis of coronary artery bypass graft(s), unspecified, with other forms of angina pectoris: Secondary | ICD-10-CM | POA: Diagnosis not present

## 2016-11-25 DIAGNOSIS — E1122 Type 2 diabetes mellitus with diabetic chronic kidney disease: Secondary | ICD-10-CM | POA: Diagnosis not present

## 2016-11-25 DIAGNOSIS — N183 Chronic kidney disease, stage 3 (moderate): Secondary | ICD-10-CM | POA: Diagnosis not present

## 2016-11-25 DIAGNOSIS — I130 Hypertensive heart and chronic kidney disease with heart failure and stage 1 through stage 4 chronic kidney disease, or unspecified chronic kidney disease: Secondary | ICD-10-CM | POA: Diagnosis not present

## 2016-11-26 DIAGNOSIS — J9611 Chronic respiratory failure with hypoxia: Secondary | ICD-10-CM | POA: Diagnosis not present

## 2016-11-26 DIAGNOSIS — I130 Hypertensive heart and chronic kidney disease with heart failure and stage 1 through stage 4 chronic kidney disease, or unspecified chronic kidney disease: Secondary | ICD-10-CM | POA: Diagnosis not present

## 2016-11-26 DIAGNOSIS — N183 Chronic kidney disease, stage 3 (moderate): Secondary | ICD-10-CM | POA: Diagnosis not present

## 2016-11-26 DIAGNOSIS — E1121 Type 2 diabetes mellitus with diabetic nephropathy: Secondary | ICD-10-CM | POA: Diagnosis not present

## 2016-11-26 DIAGNOSIS — E1122 Type 2 diabetes mellitus with diabetic chronic kidney disease: Secondary | ICD-10-CM | POA: Diagnosis not present

## 2016-11-26 DIAGNOSIS — I25708 Atherosclerosis of coronary artery bypass graft(s), unspecified, with other forms of angina pectoris: Secondary | ICD-10-CM | POA: Diagnosis not present

## 2016-11-29 ENCOUNTER — Telehealth: Payer: Self-pay | Admitting: Family Medicine

## 2016-11-29 DIAGNOSIS — E1121 Type 2 diabetes mellitus with diabetic nephropathy: Secondary | ICD-10-CM | POA: Diagnosis not present

## 2016-11-29 DIAGNOSIS — N183 Chronic kidney disease, stage 3 (moderate): Secondary | ICD-10-CM | POA: Diagnosis not present

## 2016-11-29 DIAGNOSIS — I130 Hypertensive heart and chronic kidney disease with heart failure and stage 1 through stage 4 chronic kidney disease, or unspecified chronic kidney disease: Secondary | ICD-10-CM | POA: Diagnosis not present

## 2016-11-29 DIAGNOSIS — J9611 Chronic respiratory failure with hypoxia: Secondary | ICD-10-CM | POA: Diagnosis not present

## 2016-11-29 DIAGNOSIS — I25708 Atherosclerosis of coronary artery bypass graft(s), unspecified, with other forms of angina pectoris: Secondary | ICD-10-CM | POA: Diagnosis not present

## 2016-11-29 DIAGNOSIS — E1122 Type 2 diabetes mellitus with diabetic chronic kidney disease: Secondary | ICD-10-CM | POA: Diagnosis not present

## 2016-11-29 NOTE — Telephone Encounter (Signed)
Spoke with Staci with Hospice relaying message per Dr. Oneita Jolly ok and she will call [place].

## 2016-11-29 NOTE — Telephone Encounter (Signed)
Copied from [place] #[id number]. Topic: Quick Communication - See Telephone Encounter >> Nov 29, 2016 10:09 AM Bea Graff, NT wrote: CRM for notification. See Telephone encounter for:  11/29/16. Staci with Hospice is calling needing refill of pts Ativan. Please give Staci a call once rx is filled at [phone]

## 2016-11-29 NOTE — Telephone Encounter (Signed)
Last Rx was with 3 refills, should be enough through 02/2017? plz have her check Rx.

## 2016-11-30 DIAGNOSIS — E1121 Type 2 diabetes mellitus with diabetic nephropathy: Secondary | ICD-10-CM | POA: Diagnosis not present

## 2016-11-30 DIAGNOSIS — N183 Chronic kidney disease, stage 3 (moderate): Secondary | ICD-10-CM | POA: Diagnosis not present

## 2016-11-30 DIAGNOSIS — E1122 Type 2 diabetes mellitus with diabetic chronic kidney disease: Secondary | ICD-10-CM | POA: Diagnosis not present

## 2016-11-30 DIAGNOSIS — J9611 Chronic respiratory failure with hypoxia: Secondary | ICD-10-CM | POA: Diagnosis not present

## 2016-11-30 DIAGNOSIS — I130 Hypertensive heart and chronic kidney disease with heart failure and stage 1 through stage 4 chronic kidney disease, or unspecified chronic kidney disease: Secondary | ICD-10-CM | POA: Diagnosis not present

## 2016-11-30 DIAGNOSIS — I25708 Atherosclerosis of coronary artery bypass graft(s), unspecified, with other forms of angina pectoris: Secondary | ICD-10-CM | POA: Diagnosis not present

## 2016-12-01 DIAGNOSIS — N183 Chronic kidney disease, stage 3 (moderate): Secondary | ICD-10-CM | POA: Diagnosis not present

## 2016-12-01 DIAGNOSIS — I25708 Atherosclerosis of coronary artery bypass graft(s), unspecified, with other forms of angina pectoris: Secondary | ICD-10-CM | POA: Diagnosis not present

## 2016-12-01 DIAGNOSIS — I130 Hypertensive heart and chronic kidney disease with heart failure and stage 1 through stage 4 chronic kidney disease, or unspecified chronic kidney disease: Secondary | ICD-10-CM | POA: Diagnosis not present

## 2016-12-01 DIAGNOSIS — E1122 Type 2 diabetes mellitus with diabetic chronic kidney disease: Secondary | ICD-10-CM | POA: Diagnosis not present

## 2016-12-01 DIAGNOSIS — J9611 Chronic respiratory failure with hypoxia: Secondary | ICD-10-CM | POA: Diagnosis not present

## 2016-12-01 DIAGNOSIS — E1121 Type 2 diabetes mellitus with diabetic nephropathy: Secondary | ICD-10-CM | POA: Diagnosis not present

## 2016-12-02 DIAGNOSIS — Z7984 Long term (current) use of oral hypoglycemic drugs: Secondary | ICD-10-CM | POA: Diagnosis not present

## 2016-12-02 DIAGNOSIS — L03115 Cellulitis of right lower limb: Secondary | ICD-10-CM | POA: Diagnosis not present

## 2016-12-02 DIAGNOSIS — N183 Chronic kidney disease, stage 3 (moderate): Secondary | ICD-10-CM | POA: Diagnosis not present

## 2016-12-02 DIAGNOSIS — E1122 Type 2 diabetes mellitus with diabetic chronic kidney disease: Secondary | ICD-10-CM | POA: Diagnosis not present

## 2016-12-02 DIAGNOSIS — Z9981 Dependence on supplemental oxygen: Secondary | ICD-10-CM | POA: Diagnosis not present

## 2016-12-02 DIAGNOSIS — E1121 Type 2 diabetes mellitus with diabetic nephropathy: Secondary | ICD-10-CM | POA: Diagnosis not present

## 2016-12-02 DIAGNOSIS — I5032 Chronic diastolic (congestive) heart failure: Secondary | ICD-10-CM | POA: Diagnosis not present

## 2016-12-02 DIAGNOSIS — I130 Hypertensive heart and chronic kidney disease with heart failure and stage 1 through stage 4 chronic kidney disease, or unspecified chronic kidney disease: Secondary | ICD-10-CM | POA: Diagnosis not present

## 2016-12-02 DIAGNOSIS — Z7902 Long term (current) use of antithrombotics/antiplatelets: Secondary | ICD-10-CM | POA: Diagnosis not present

## 2016-12-02 DIAGNOSIS — R531 Weakness: Secondary | ICD-10-CM | POA: Diagnosis not present

## 2016-12-02 DIAGNOSIS — Z7901 Long term (current) use of anticoagulants: Secondary | ICD-10-CM | POA: Diagnosis not present

## 2016-12-02 DIAGNOSIS — Z9181 History of falling: Secondary | ICD-10-CM | POA: Diagnosis not present

## 2016-12-02 DIAGNOSIS — J9611 Chronic respiratory failure with hypoxia: Secondary | ICD-10-CM | POA: Diagnosis not present

## 2016-12-02 DIAGNOSIS — E785 Hyperlipidemia, unspecified: Secondary | ICD-10-CM | POA: Diagnosis not present

## 2016-12-02 DIAGNOSIS — I25708 Atherosclerosis of coronary artery bypass graft(s), unspecified, with other forms of angina pectoris: Secondary | ICD-10-CM | POA: Diagnosis not present

## 2016-12-03 DIAGNOSIS — E1121 Type 2 diabetes mellitus with diabetic nephropathy: Secondary | ICD-10-CM | POA: Diagnosis not present

## 2016-12-03 DIAGNOSIS — N183 Chronic kidney disease, stage 3 (moderate): Secondary | ICD-10-CM | POA: Diagnosis not present

## 2016-12-03 DIAGNOSIS — I130 Hypertensive heart and chronic kidney disease with heart failure and stage 1 through stage 4 chronic kidney disease, or unspecified chronic kidney disease: Secondary | ICD-10-CM | POA: Diagnosis not present

## 2016-12-03 DIAGNOSIS — I25708 Atherosclerosis of coronary artery bypass graft(s), unspecified, with other forms of angina pectoris: Secondary | ICD-10-CM | POA: Diagnosis not present

## 2016-12-03 DIAGNOSIS — J9611 Chronic respiratory failure with hypoxia: Secondary | ICD-10-CM | POA: Diagnosis not present

## 2016-12-03 DIAGNOSIS — E1122 Type 2 diabetes mellitus with diabetic chronic kidney disease: Secondary | ICD-10-CM | POA: Diagnosis not present

## 2016-12-04 DIAGNOSIS — N183 Chronic kidney disease, stage 3 (moderate): Secondary | ICD-10-CM | POA: Diagnosis not present

## 2016-12-04 DIAGNOSIS — E1121 Type 2 diabetes mellitus with diabetic nephropathy: Secondary | ICD-10-CM | POA: Diagnosis not present

## 2016-12-04 DIAGNOSIS — I25708 Atherosclerosis of coronary artery bypass graft(s), unspecified, with other forms of angina pectoris: Secondary | ICD-10-CM | POA: Diagnosis not present

## 2016-12-04 DIAGNOSIS — E1122 Type 2 diabetes mellitus with diabetic chronic kidney disease: Secondary | ICD-10-CM | POA: Diagnosis not present

## 2016-12-04 DIAGNOSIS — I130 Hypertensive heart and chronic kidney disease with heart failure and stage 1 through stage 4 chronic kidney disease, or unspecified chronic kidney disease: Secondary | ICD-10-CM | POA: Diagnosis not present

## 2016-12-04 DIAGNOSIS — J9611 Chronic respiratory failure with hypoxia: Secondary | ICD-10-CM | POA: Diagnosis not present

## 2016-12-06 ENCOUNTER — Telehealth: Payer: Self-pay | Admitting: Family Medicine

## 2016-12-06 DIAGNOSIS — J9611 Chronic respiratory failure with hypoxia: Secondary | ICD-10-CM | POA: Diagnosis not present

## 2016-12-06 DIAGNOSIS — E1121 Type 2 diabetes mellitus with diabetic nephropathy: Secondary | ICD-10-CM | POA: Diagnosis not present

## 2016-12-06 DIAGNOSIS — N183 Chronic kidney disease, stage 3 (moderate): Secondary | ICD-10-CM | POA: Diagnosis not present

## 2016-12-06 DIAGNOSIS — I130 Hypertensive heart and chronic kidney disease with heart failure and stage 1 through stage 4 chronic kidney disease, or unspecified chronic kidney disease: Secondary | ICD-10-CM | POA: Diagnosis not present

## 2016-12-06 DIAGNOSIS — I25708 Atherosclerosis of coronary artery bypass graft(s), unspecified, with other forms of angina pectoris: Secondary | ICD-10-CM | POA: Diagnosis not present

## 2016-12-06 DIAGNOSIS — E1122 Type 2 diabetes mellitus with diabetic chronic kidney disease: Secondary | ICD-10-CM | POA: Diagnosis not present

## 2016-12-06 NOTE — Telephone Encounter (Signed)
Copied from [place] [id number]. Topic: General - Other >> Dec 06, 2016 12:08 PM Cleaster Corin, NT wrote: Reason for CRM: pt. Doesn't Have any more tablets of  1 mg. Tablet of Lorazapam (ativan) just took last pill supposed to get another tonight at [facility] just called and said that the script can be faxed to [facility]

## 2016-12-06 NOTE — Telephone Encounter (Signed)
Copied from [place] [id number]. Topic: Quick Communication - See Telephone Encounter >> Dec 06, 2016  1:23 PM Corie Chiquito, [place] wrote: CRM for notification. See Telephone encounter for:   12/06/16. [facility]NVVYXA [employer] from [facility] called because the patient is out of her Ativan and needs a refill. Ms.Shikita has been in contact with the pharmacy as well

## 2016-12-06 NOTE — Telephone Encounter (Addendum)
Dr. Danise Mina printed last lorazepam 1 mg rx on 10/07/16 with 3 refills. Pt should not need refill yet. Attempted to contact [facility] but no answer, no vm.

## 2016-12-06 NOTE — Telephone Encounter (Signed)
[facility] cannot refill controlled substance- thank you

## 2016-12-07 ENCOUNTER — Telehealth: Payer: Self-pay | Admitting: Family Medicine

## 2016-12-07 DIAGNOSIS — E1121 Type 2 diabetes mellitus with diabetic nephropathy: Secondary | ICD-10-CM | POA: Diagnosis not present

## 2016-12-07 DIAGNOSIS — E1122 Type 2 diabetes mellitus with diabetic chronic kidney disease: Secondary | ICD-10-CM | POA: Diagnosis not present

## 2016-12-07 DIAGNOSIS — I130 Hypertensive heart and chronic kidney disease with heart failure and stage 1 through stage 4 chronic kidney disease, or unspecified chronic kidney disease: Secondary | ICD-10-CM | POA: Diagnosis not present

## 2016-12-07 DIAGNOSIS — J9611 Chronic respiratory failure with hypoxia: Secondary | ICD-10-CM | POA: Diagnosis not present

## 2016-12-07 DIAGNOSIS — I25708 Atherosclerosis of coronary artery bypass graft(s), unspecified, with other forms of angina pectoris: Secondary | ICD-10-CM | POA: Diagnosis not present

## 2016-12-07 DIAGNOSIS — N183 Chronic kidney disease, stage 3 (moderate): Secondary | ICD-10-CM | POA: Diagnosis not present

## 2016-12-07 NOTE — Telephone Encounter (Signed)
Spoke with Evette at [facility] about pt's lorazepam 1 mg rx.  Says they have spoken with the pharmacy and was told there was no quantity on the rx and that's why it has not been released.

## 2016-12-07 NOTE — Telephone Encounter (Signed)
[place] and was told rx for both 0.5 mg and 1 mg rxs are being sent out today.    I have notified Evette at [facility]. Says ok and expresses her thanks.

## 2016-12-07 NOTE — Telephone Encounter (Signed)
Phone call from [place] at [facility].  Reported she spoke with a nurse earlier today at the office.  Was advised that Dr. Danise Mina was not going to order any refills on Lorazepam. Reported they rec'd a physician communication form on 9/24 advising to d/c the Lorazepam 0.25 mg.  Stated that the Lorazepam 0.5 mg daily at noon, does not have a quantity, so it can not be dispensed.  Reported that if Dr. Danise Mina wants her to stay on the Lorazepam 0.5 mg, 1 tab qd at noon, she will need a new Rx. order.  Advised will route the message to Dr. Synthia Innocent nurse.

## 2016-12-08 DIAGNOSIS — J9611 Chronic respiratory failure with hypoxia: Secondary | ICD-10-CM | POA: Diagnosis not present

## 2016-12-08 DIAGNOSIS — N183 Chronic kidney disease, stage 3 (moderate): Secondary | ICD-10-CM | POA: Diagnosis not present

## 2016-12-08 DIAGNOSIS — E1121 Type 2 diabetes mellitus with diabetic nephropathy: Secondary | ICD-10-CM | POA: Diagnosis not present

## 2016-12-08 DIAGNOSIS — E1122 Type 2 diabetes mellitus with diabetic chronic kidney disease: Secondary | ICD-10-CM | POA: Diagnosis not present

## 2016-12-08 DIAGNOSIS — I130 Hypertensive heart and chronic kidney disease with heart failure and stage 1 through stage 4 chronic kidney disease, or unspecified chronic kidney disease: Secondary | ICD-10-CM | POA: Diagnosis not present

## 2016-12-08 DIAGNOSIS — I25708 Atherosclerosis of coronary artery bypass graft(s), unspecified, with other forms of angina pectoris: Secondary | ICD-10-CM | POA: Diagnosis not present

## 2016-12-08 NOTE — Telephone Encounter (Signed)
rec'd another phone call from [employer]; representative was [place].  She would like a return phone call to complete this Rx; if it needs to be continued, the pharmacy is requesting a quantity to be given. (there seems to be confusion around the pharmacy receiving a Physician communication form on 9/24, and the Lorazepam 0.25 mg was d/c'd; and they are questioning if the pt. Is to continue the Lorazepam 0.5 mg at 12:00 PM, but needs an Rx) Call [facility] @ [place] @ [phone], ext.[phone].

## 2016-12-08 NOTE — Telephone Encounter (Signed)
This is a [facility] patient

## 2016-12-08 NOTE — Telephone Encounter (Signed)
I don't understand the confusion.  Pt needs to be on lorazepam 0.5mg  at noon daily and 1mg  at 10pm daily - this is clearly in chart.  We printed Rx for both of these #30 with RF 3 on 10/07/2016 so should last through 02/2017?? plz call pharmacy to confirm this.

## 2016-12-09 DIAGNOSIS — E1121 Type 2 diabetes mellitus with diabetic nephropathy: Secondary | ICD-10-CM | POA: Diagnosis not present

## 2016-12-09 DIAGNOSIS — I130 Hypertensive heart and chronic kidney disease with heart failure and stage 1 through stage 4 chronic kidney disease, or unspecified chronic kidney disease: Secondary | ICD-10-CM | POA: Diagnosis not present

## 2016-12-09 DIAGNOSIS — I25708 Atherosclerosis of coronary artery bypass graft(s), unspecified, with other forms of angina pectoris: Secondary | ICD-10-CM | POA: Diagnosis not present

## 2016-12-09 DIAGNOSIS — E1122 Type 2 diabetes mellitus with diabetic chronic kidney disease: Secondary | ICD-10-CM | POA: Diagnosis not present

## 2016-12-09 DIAGNOSIS — J9611 Chronic respiratory failure with hypoxia: Secondary | ICD-10-CM | POA: Diagnosis not present

## 2016-12-09 DIAGNOSIS — N183 Chronic kidney disease, stage 3 (moderate): Secondary | ICD-10-CM | POA: Diagnosis not present

## 2016-12-09 NOTE — Telephone Encounter (Signed)
Spoke with [facility] and got both rxs straightened out. Says they will take care of it an make sure pt gets them.

## 2016-12-09 NOTE — Telephone Encounter (Signed)
Thank you!  Christine Blanchard

## 2016-12-10 DIAGNOSIS — I25708 Atherosclerosis of coronary artery bypass graft(s), unspecified, with other forms of angina pectoris: Secondary | ICD-10-CM | POA: Diagnosis not present

## 2016-12-10 DIAGNOSIS — N183 Chronic kidney disease, stage 3 (moderate): Secondary | ICD-10-CM | POA: Diagnosis not present

## 2016-12-10 DIAGNOSIS — E1121 Type 2 diabetes mellitus with diabetic nephropathy: Secondary | ICD-10-CM | POA: Diagnosis not present

## 2016-12-10 DIAGNOSIS — I130 Hypertensive heart and chronic kidney disease with heart failure and stage 1 through stage 4 chronic kidney disease, or unspecified chronic kidney disease: Secondary | ICD-10-CM | POA: Diagnosis not present

## 2016-12-10 DIAGNOSIS — J9611 Chronic respiratory failure with hypoxia: Secondary | ICD-10-CM | POA: Diagnosis not present

## 2016-12-10 DIAGNOSIS — E1122 Type 2 diabetes mellitus with diabetic chronic kidney disease: Secondary | ICD-10-CM | POA: Diagnosis not present

## 2016-12-13 DIAGNOSIS — E1121 Type 2 diabetes mellitus with diabetic nephropathy: Secondary | ICD-10-CM | POA: Diagnosis not present

## 2016-12-13 DIAGNOSIS — E1122 Type 2 diabetes mellitus with diabetic chronic kidney disease: Secondary | ICD-10-CM | POA: Diagnosis not present

## 2016-12-13 DIAGNOSIS — I130 Hypertensive heart and chronic kidney disease with heart failure and stage 1 through stage 4 chronic kidney disease, or unspecified chronic kidney disease: Secondary | ICD-10-CM | POA: Diagnosis not present

## 2016-12-13 DIAGNOSIS — J9611 Chronic respiratory failure with hypoxia: Secondary | ICD-10-CM | POA: Diagnosis not present

## 2016-12-13 DIAGNOSIS — N183 Chronic kidney disease, stage 3 (moderate): Secondary | ICD-10-CM | POA: Diagnosis not present

## 2016-12-13 DIAGNOSIS — I25708 Atherosclerosis of coronary artery bypass graft(s), unspecified, with other forms of angina pectoris: Secondary | ICD-10-CM | POA: Diagnosis not present

## 2016-12-14 DIAGNOSIS — E1121 Type 2 diabetes mellitus with diabetic nephropathy: Secondary | ICD-10-CM | POA: Diagnosis not present

## 2016-12-14 DIAGNOSIS — I25708 Atherosclerosis of coronary artery bypass graft(s), unspecified, with other forms of angina pectoris: Secondary | ICD-10-CM | POA: Diagnosis not present

## 2016-12-14 DIAGNOSIS — I130 Hypertensive heart and chronic kidney disease with heart failure and stage 1 through stage 4 chronic kidney disease, or unspecified chronic kidney disease: Secondary | ICD-10-CM | POA: Diagnosis not present

## 2016-12-14 DIAGNOSIS — E1122 Type 2 diabetes mellitus with diabetic chronic kidney disease: Secondary | ICD-10-CM | POA: Diagnosis not present

## 2016-12-14 DIAGNOSIS — N183 Chronic kidney disease, stage 3 (moderate): Secondary | ICD-10-CM | POA: Diagnosis not present

## 2016-12-14 DIAGNOSIS — J9611 Chronic respiratory failure with hypoxia: Secondary | ICD-10-CM | POA: Diagnosis not present

## 2016-12-15 DIAGNOSIS — E1122 Type 2 diabetes mellitus with diabetic chronic kidney disease: Secondary | ICD-10-CM | POA: Diagnosis not present

## 2016-12-15 DIAGNOSIS — I25708 Atherosclerosis of coronary artery bypass graft(s), unspecified, with other forms of angina pectoris: Secondary | ICD-10-CM | POA: Diagnosis not present

## 2016-12-15 DIAGNOSIS — E1121 Type 2 diabetes mellitus with diabetic nephropathy: Secondary | ICD-10-CM | POA: Diagnosis not present

## 2016-12-15 DIAGNOSIS — N183 Chronic kidney disease, stage 3 (moderate): Secondary | ICD-10-CM | POA: Diagnosis not present

## 2016-12-15 DIAGNOSIS — I130 Hypertensive heart and chronic kidney disease with heart failure and stage 1 through stage 4 chronic kidney disease, or unspecified chronic kidney disease: Secondary | ICD-10-CM | POA: Diagnosis not present

## 2016-12-15 DIAGNOSIS — J9611 Chronic respiratory failure with hypoxia: Secondary | ICD-10-CM | POA: Diagnosis not present

## 2016-12-16 DIAGNOSIS — J9611 Chronic respiratory failure with hypoxia: Secondary | ICD-10-CM | POA: Diagnosis not present

## 2016-12-16 DIAGNOSIS — I25708 Atherosclerosis of coronary artery bypass graft(s), unspecified, with other forms of angina pectoris: Secondary | ICD-10-CM | POA: Diagnosis not present

## 2016-12-16 DIAGNOSIS — N183 Chronic kidney disease, stage 3 (moderate): Secondary | ICD-10-CM | POA: Diagnosis not present

## 2016-12-16 DIAGNOSIS — E1122 Type 2 diabetes mellitus with diabetic chronic kidney disease: Secondary | ICD-10-CM | POA: Diagnosis not present

## 2016-12-16 DIAGNOSIS — I130 Hypertensive heart and chronic kidney disease with heart failure and stage 1 through stage 4 chronic kidney disease, or unspecified chronic kidney disease: Secondary | ICD-10-CM | POA: Diagnosis not present

## 2016-12-16 DIAGNOSIS — E1121 Type 2 diabetes mellitus with diabetic nephropathy: Secondary | ICD-10-CM | POA: Diagnosis not present

## 2016-12-17 DIAGNOSIS — N183 Chronic kidney disease, stage 3 (moderate): Secondary | ICD-10-CM | POA: Diagnosis not present

## 2016-12-17 DIAGNOSIS — E1121 Type 2 diabetes mellitus with diabetic nephropathy: Secondary | ICD-10-CM | POA: Diagnosis not present

## 2016-12-17 DIAGNOSIS — E1122 Type 2 diabetes mellitus with diabetic chronic kidney disease: Secondary | ICD-10-CM | POA: Diagnosis not present

## 2016-12-17 DIAGNOSIS — J9611 Chronic respiratory failure with hypoxia: Secondary | ICD-10-CM | POA: Diagnosis not present

## 2016-12-17 DIAGNOSIS — I25708 Atherosclerosis of coronary artery bypass graft(s), unspecified, with other forms of angina pectoris: Secondary | ICD-10-CM | POA: Diagnosis not present

## 2016-12-17 DIAGNOSIS — I130 Hypertensive heart and chronic kidney disease with heart failure and stage 1 through stage 4 chronic kidney disease, or unspecified chronic kidney disease: Secondary | ICD-10-CM | POA: Diagnosis not present

## 2016-12-20 DIAGNOSIS — I130 Hypertensive heart and chronic kidney disease with heart failure and stage 1 through stage 4 chronic kidney disease, or unspecified chronic kidney disease: Secondary | ICD-10-CM | POA: Diagnosis not present

## 2016-12-20 DIAGNOSIS — I25708 Atherosclerosis of coronary artery bypass graft(s), unspecified, with other forms of angina pectoris: Secondary | ICD-10-CM | POA: Diagnosis not present

## 2016-12-20 DIAGNOSIS — E1122 Type 2 diabetes mellitus with diabetic chronic kidney disease: Secondary | ICD-10-CM | POA: Diagnosis not present

## 2016-12-20 DIAGNOSIS — E1121 Type 2 diabetes mellitus with diabetic nephropathy: Secondary | ICD-10-CM | POA: Diagnosis not present

## 2016-12-20 DIAGNOSIS — J9611 Chronic respiratory failure with hypoxia: Secondary | ICD-10-CM | POA: Diagnosis not present

## 2016-12-20 DIAGNOSIS — N183 Chronic kidney disease, stage 3 (moderate): Secondary | ICD-10-CM | POA: Diagnosis not present

## 2016-12-21 ENCOUNTER — Other Ambulatory Visit: Payer: Self-pay

## 2016-12-21 DIAGNOSIS — E1122 Type 2 diabetes mellitus with diabetic chronic kidney disease: Secondary | ICD-10-CM | POA: Diagnosis not present

## 2016-12-21 DIAGNOSIS — N183 Chronic kidney disease, stage 3 (moderate): Secondary | ICD-10-CM | POA: Diagnosis not present

## 2016-12-21 DIAGNOSIS — J9611 Chronic respiratory failure with hypoxia: Secondary | ICD-10-CM | POA: Diagnosis not present

## 2016-12-21 DIAGNOSIS — E1121 Type 2 diabetes mellitus with diabetic nephropathy: Secondary | ICD-10-CM | POA: Diagnosis not present

## 2016-12-21 DIAGNOSIS — I25708 Atherosclerosis of coronary artery bypass graft(s), unspecified, with other forms of angina pectoris: Secondary | ICD-10-CM | POA: Diagnosis not present

## 2016-12-21 DIAGNOSIS — I130 Hypertensive heart and chronic kidney disease with heart failure and stage 1 through stage 4 chronic kidney disease, or unspecified chronic kidney disease: Secondary | ICD-10-CM | POA: Diagnosis not present

## 2016-12-21 NOTE — Telephone Encounter (Signed)
Last printed:  10/07/16, #30 Last OV:  10/25/16 Next OV:  01/20/17

## 2016-12-22 DIAGNOSIS — N183 Chronic kidney disease, stage 3 (moderate): Secondary | ICD-10-CM | POA: Diagnosis not present

## 2016-12-22 DIAGNOSIS — J9611 Chronic respiratory failure with hypoxia: Secondary | ICD-10-CM | POA: Diagnosis not present

## 2016-12-22 DIAGNOSIS — I130 Hypertensive heart and chronic kidney disease with heart failure and stage 1 through stage 4 chronic kidney disease, or unspecified chronic kidney disease: Secondary | ICD-10-CM | POA: Diagnosis not present

## 2016-12-22 DIAGNOSIS — E1121 Type 2 diabetes mellitus with diabetic nephropathy: Secondary | ICD-10-CM | POA: Diagnosis not present

## 2016-12-22 DIAGNOSIS — E1122 Type 2 diabetes mellitus with diabetic chronic kidney disease: Secondary | ICD-10-CM | POA: Diagnosis not present

## 2016-12-22 DIAGNOSIS — I25708 Atherosclerosis of coronary artery bypass graft(s), unspecified, with other forms of angina pectoris: Secondary | ICD-10-CM | POA: Diagnosis not present

## 2016-12-22 NOTE — Telephone Encounter (Signed)
see last phone note. plz call [place] to straighten meds out. Pt should not be on lorazepam 1/2 tab of 0.5mg .  Should be taking 0.5mg  at noon and 1mg  at bedtime.

## 2016-12-22 NOTE — Telephone Encounter (Signed)
Thank you :)

## 2016-12-22 NOTE — Telephone Encounter (Signed)
Spoke with Amy says she sees where both rxs were called in with the refills. Says we should not be receiving refill requests but she will look into it and try to take care of it.

## 2016-12-23 DIAGNOSIS — I25708 Atherosclerosis of coronary artery bypass graft(s), unspecified, with other forms of angina pectoris: Secondary | ICD-10-CM | POA: Diagnosis not present

## 2016-12-23 DIAGNOSIS — I130 Hypertensive heart and chronic kidney disease with heart failure and stage 1 through stage 4 chronic kidney disease, or unspecified chronic kidney disease: Secondary | ICD-10-CM | POA: Diagnosis not present

## 2016-12-23 DIAGNOSIS — N183 Chronic kidney disease, stage 3 (moderate): Secondary | ICD-10-CM | POA: Diagnosis not present

## 2016-12-23 DIAGNOSIS — E1122 Type 2 diabetes mellitus with diabetic chronic kidney disease: Secondary | ICD-10-CM | POA: Diagnosis not present

## 2016-12-23 DIAGNOSIS — J9611 Chronic respiratory failure with hypoxia: Secondary | ICD-10-CM | POA: Diagnosis not present

## 2016-12-23 DIAGNOSIS — E1121 Type 2 diabetes mellitus with diabetic nephropathy: Secondary | ICD-10-CM | POA: Diagnosis not present

## 2016-12-24 DIAGNOSIS — J9611 Chronic respiratory failure with hypoxia: Secondary | ICD-10-CM | POA: Diagnosis not present

## 2016-12-24 DIAGNOSIS — E1121 Type 2 diabetes mellitus with diabetic nephropathy: Secondary | ICD-10-CM | POA: Diagnosis not present

## 2016-12-24 DIAGNOSIS — E1122 Type 2 diabetes mellitus with diabetic chronic kidney disease: Secondary | ICD-10-CM | POA: Diagnosis not present

## 2016-12-24 DIAGNOSIS — I130 Hypertensive heart and chronic kidney disease with heart failure and stage 1 through stage 4 chronic kidney disease, or unspecified chronic kidney disease: Secondary | ICD-10-CM | POA: Diagnosis not present

## 2016-12-24 DIAGNOSIS — I25708 Atherosclerosis of coronary artery bypass graft(s), unspecified, with other forms of angina pectoris: Secondary | ICD-10-CM | POA: Diagnosis not present

## 2016-12-24 DIAGNOSIS — N183 Chronic kidney disease, stage 3 (moderate): Secondary | ICD-10-CM | POA: Diagnosis not present

## 2016-12-27 DIAGNOSIS — E1122 Type 2 diabetes mellitus with diabetic chronic kidney disease: Secondary | ICD-10-CM | POA: Diagnosis not present

## 2016-12-27 DIAGNOSIS — N183 Chronic kidney disease, stage 3 (moderate): Secondary | ICD-10-CM | POA: Diagnosis not present

## 2016-12-27 DIAGNOSIS — J9611 Chronic respiratory failure with hypoxia: Secondary | ICD-10-CM | POA: Diagnosis not present

## 2016-12-27 DIAGNOSIS — I25708 Atherosclerosis of coronary artery bypass graft(s), unspecified, with other forms of angina pectoris: Secondary | ICD-10-CM | POA: Diagnosis not present

## 2016-12-27 DIAGNOSIS — E1121 Type 2 diabetes mellitus with diabetic nephropathy: Secondary | ICD-10-CM | POA: Diagnosis not present

## 2016-12-27 DIAGNOSIS — I130 Hypertensive heart and chronic kidney disease with heart failure and stage 1 through stage 4 chronic kidney disease, or unspecified chronic kidney disease: Secondary | ICD-10-CM | POA: Diagnosis not present

## 2016-12-28 DIAGNOSIS — N183 Chronic kidney disease, stage 3 (moderate): Secondary | ICD-10-CM | POA: Diagnosis not present

## 2016-12-28 DIAGNOSIS — J9611 Chronic respiratory failure with hypoxia: Secondary | ICD-10-CM | POA: Diagnosis not present

## 2016-12-28 DIAGNOSIS — E1121 Type 2 diabetes mellitus with diabetic nephropathy: Secondary | ICD-10-CM | POA: Diagnosis not present

## 2016-12-28 DIAGNOSIS — I130 Hypertensive heart and chronic kidney disease with heart failure and stage 1 through stage 4 chronic kidney disease, or unspecified chronic kidney disease: Secondary | ICD-10-CM | POA: Diagnosis not present

## 2016-12-28 DIAGNOSIS — E1122 Type 2 diabetes mellitus with diabetic chronic kidney disease: Secondary | ICD-10-CM | POA: Diagnosis not present

## 2016-12-28 DIAGNOSIS — I25708 Atherosclerosis of coronary artery bypass graft(s), unspecified, with other forms of angina pectoris: Secondary | ICD-10-CM | POA: Diagnosis not present

## 2016-12-29 DIAGNOSIS — I130 Hypertensive heart and chronic kidney disease with heart failure and stage 1 through stage 4 chronic kidney disease, or unspecified chronic kidney disease: Secondary | ICD-10-CM | POA: Diagnosis not present

## 2016-12-29 DIAGNOSIS — I25708 Atherosclerosis of coronary artery bypass graft(s), unspecified, with other forms of angina pectoris: Secondary | ICD-10-CM | POA: Diagnosis not present

## 2016-12-29 DIAGNOSIS — N183 Chronic kidney disease, stage 3 (moderate): Secondary | ICD-10-CM | POA: Diagnosis not present

## 2016-12-29 DIAGNOSIS — E1121 Type 2 diabetes mellitus with diabetic nephropathy: Secondary | ICD-10-CM | POA: Diagnosis not present

## 2016-12-29 DIAGNOSIS — E1122 Type 2 diabetes mellitus with diabetic chronic kidney disease: Secondary | ICD-10-CM | POA: Diagnosis not present

## 2016-12-29 DIAGNOSIS — J9611 Chronic respiratory failure with hypoxia: Secondary | ICD-10-CM | POA: Diagnosis not present

## 2016-12-30 DIAGNOSIS — E1122 Type 2 diabetes mellitus with diabetic chronic kidney disease: Secondary | ICD-10-CM | POA: Diagnosis not present

## 2016-12-30 DIAGNOSIS — J9611 Chronic respiratory failure with hypoxia: Secondary | ICD-10-CM | POA: Diagnosis not present

## 2016-12-30 DIAGNOSIS — I130 Hypertensive heart and chronic kidney disease with heart failure and stage 1 through stage 4 chronic kidney disease, or unspecified chronic kidney disease: Secondary | ICD-10-CM | POA: Diagnosis not present

## 2016-12-30 DIAGNOSIS — N183 Chronic kidney disease, stage 3 (moderate): Secondary | ICD-10-CM | POA: Diagnosis not present

## 2016-12-30 DIAGNOSIS — E1121 Type 2 diabetes mellitus with diabetic nephropathy: Secondary | ICD-10-CM | POA: Diagnosis not present

## 2016-12-30 DIAGNOSIS — I25708 Atherosclerosis of coronary artery bypass graft(s), unspecified, with other forms of angina pectoris: Secondary | ICD-10-CM | POA: Diagnosis not present

## 2016-12-31 DIAGNOSIS — J9611 Chronic respiratory failure with hypoxia: Secondary | ICD-10-CM | POA: Diagnosis not present

## 2016-12-31 DIAGNOSIS — E1122 Type 2 diabetes mellitus with diabetic chronic kidney disease: Secondary | ICD-10-CM | POA: Diagnosis not present

## 2016-12-31 DIAGNOSIS — I25708 Atherosclerosis of coronary artery bypass graft(s), unspecified, with other forms of angina pectoris: Secondary | ICD-10-CM | POA: Diagnosis not present

## 2016-12-31 DIAGNOSIS — I130 Hypertensive heart and chronic kidney disease with heart failure and stage 1 through stage 4 chronic kidney disease, or unspecified chronic kidney disease: Secondary | ICD-10-CM | POA: Diagnosis not present

## 2016-12-31 DIAGNOSIS — N183 Chronic kidney disease, stage 3 (moderate): Secondary | ICD-10-CM | POA: Diagnosis not present

## 2016-12-31 DIAGNOSIS — E1121 Type 2 diabetes mellitus with diabetic nephropathy: Secondary | ICD-10-CM | POA: Diagnosis not present

## 2017-01-01 DIAGNOSIS — E1121 Type 2 diabetes mellitus with diabetic nephropathy: Secondary | ICD-10-CM | POA: Diagnosis not present

## 2017-01-01 DIAGNOSIS — L03115 Cellulitis of right lower limb: Secondary | ICD-10-CM | POA: Diagnosis not present

## 2017-01-01 DIAGNOSIS — Z7901 Long term (current) use of anticoagulants: Secondary | ICD-10-CM | POA: Diagnosis not present

## 2017-01-01 DIAGNOSIS — Z9181 History of falling: Secondary | ICD-10-CM | POA: Diagnosis not present

## 2017-01-01 DIAGNOSIS — Z9981 Dependence on supplemental oxygen: Secondary | ICD-10-CM | POA: Diagnosis not present

## 2017-01-01 DIAGNOSIS — I5032 Chronic diastolic (congestive) heart failure: Secondary | ICD-10-CM | POA: Diagnosis not present

## 2017-01-01 DIAGNOSIS — I130 Hypertensive heart and chronic kidney disease with heart failure and stage 1 through stage 4 chronic kidney disease, or unspecified chronic kidney disease: Secondary | ICD-10-CM | POA: Diagnosis not present

## 2017-01-01 DIAGNOSIS — R531 Weakness: Secondary | ICD-10-CM | POA: Diagnosis not present

## 2017-01-01 DIAGNOSIS — I25708 Atherosclerosis of coronary artery bypass graft(s), unspecified, with other forms of angina pectoris: Secondary | ICD-10-CM | POA: Diagnosis not present

## 2017-01-01 DIAGNOSIS — Z7984 Long term (current) use of oral hypoglycemic drugs: Secondary | ICD-10-CM | POA: Diagnosis not present

## 2017-01-01 DIAGNOSIS — E1122 Type 2 diabetes mellitus with diabetic chronic kidney disease: Secondary | ICD-10-CM | POA: Diagnosis not present

## 2017-01-01 DIAGNOSIS — J9611 Chronic respiratory failure with hypoxia: Secondary | ICD-10-CM | POA: Diagnosis not present

## 2017-01-01 DIAGNOSIS — Z7902 Long term (current) use of antithrombotics/antiplatelets: Secondary | ICD-10-CM | POA: Diagnosis not present

## 2017-01-01 DIAGNOSIS — N183 Chronic kidney disease, stage 3 (moderate): Secondary | ICD-10-CM | POA: Diagnosis not present

## 2017-01-01 DIAGNOSIS — E785 Hyperlipidemia, unspecified: Secondary | ICD-10-CM | POA: Diagnosis not present

## 2017-01-03 ENCOUNTER — Other Ambulatory Visit: Payer: Self-pay | Admitting: Family Medicine

## 2017-01-03 DIAGNOSIS — I130 Hypertensive heart and chronic kidney disease with heart failure and stage 1 through stage 4 chronic kidney disease, or unspecified chronic kidney disease: Secondary | ICD-10-CM | POA: Diagnosis not present

## 2017-01-03 DIAGNOSIS — J9611 Chronic respiratory failure with hypoxia: Secondary | ICD-10-CM | POA: Diagnosis not present

## 2017-01-03 DIAGNOSIS — N183 Chronic kidney disease, stage 3 (moderate): Secondary | ICD-10-CM | POA: Diagnosis not present

## 2017-01-03 DIAGNOSIS — I5032 Chronic diastolic (congestive) heart failure: Secondary | ICD-10-CM | POA: Diagnosis not present

## 2017-01-03 DIAGNOSIS — E1122 Type 2 diabetes mellitus with diabetic chronic kidney disease: Secondary | ICD-10-CM | POA: Diagnosis not present

## 2017-01-03 DIAGNOSIS — E1121 Type 2 diabetes mellitus with diabetic nephropathy: Secondary | ICD-10-CM | POA: Diagnosis not present

## 2017-01-03 NOTE — Telephone Encounter (Signed)
Copied from [place] [id number]. Topic: Quick Communication - Rx Refill/Question >> Jan 03, 2017 10:11 AM Marin Olp L wrote: Has the patient contacted their pharmacy? Yes.   (Agent: If no, request that the patient contact the pharmacy for the refill.) Preferred Pharmacy (with phone number or street name): [place], [place] Agent: Please be advised that RX refills may take up to 48 hours. We ask that you follow-up with your pharmacy. refill [place] 0.5 once daily at noon

## 2017-01-03 NOTE — Telephone Encounter (Signed)
Called pt. Back and instructed her that her Ativan went to [facility]. Verbalizes understanding.

## 2017-01-04 ENCOUNTER — Telehealth: Payer: Self-pay | Admitting: Family Medicine

## 2017-01-04 DIAGNOSIS — E1121 Type 2 diabetes mellitus with diabetic nephropathy: Secondary | ICD-10-CM | POA: Diagnosis not present

## 2017-01-04 DIAGNOSIS — E1122 Type 2 diabetes mellitus with diabetic chronic kidney disease: Secondary | ICD-10-CM | POA: Diagnosis not present

## 2017-01-04 DIAGNOSIS — J9611 Chronic respiratory failure with hypoxia: Secondary | ICD-10-CM | POA: Diagnosis not present

## 2017-01-04 DIAGNOSIS — I5032 Chronic diastolic (congestive) heart failure: Secondary | ICD-10-CM | POA: Diagnosis not present

## 2017-01-04 DIAGNOSIS — I130 Hypertensive heart and chronic kidney disease with heart failure and stage 1 through stage 4 chronic kidney disease, or unspecified chronic kidney disease: Secondary | ICD-10-CM | POA: Diagnosis not present

## 2017-01-04 DIAGNOSIS — N183 Chronic kidney disease, stage 3 (moderate): Secondary | ICD-10-CM | POA: Diagnosis not present

## 2017-01-04 NOTE — Telephone Encounter (Signed)
Copied from [place]. Topic: Quick Communication - See Telephone Encounter >> Jan 04, 2017  2:38 PM Synthia Innocent wrote: CRM for notification. See Telephone encounter for:  Right Knee skin infection(no odor, some yellow drainage ), treating with aquacel ag, does the dr want to prescribe a antibiotic, or what to see what aquacel does? Please advise. Still have not received refill requested yesterday on ativan 0.5mg . 01/04/17.

## 2017-01-05 ENCOUNTER — Ambulatory Visit: Payer: Self-pay

## 2017-01-05 DIAGNOSIS — I5032 Chronic diastolic (congestive) heart failure: Secondary | ICD-10-CM | POA: Diagnosis not present

## 2017-01-05 DIAGNOSIS — E1122 Type 2 diabetes mellitus with diabetic chronic kidney disease: Secondary | ICD-10-CM | POA: Diagnosis not present

## 2017-01-05 DIAGNOSIS — J9611 Chronic respiratory failure with hypoxia: Secondary | ICD-10-CM | POA: Diagnosis not present

## 2017-01-05 DIAGNOSIS — E1121 Type 2 diabetes mellitus with diabetic nephropathy: Secondary | ICD-10-CM | POA: Diagnosis not present

## 2017-01-05 DIAGNOSIS — N183 Chronic kidney disease, stage 3 (moderate): Secondary | ICD-10-CM | POA: Diagnosis not present

## 2017-01-05 DIAGNOSIS — I130 Hypertensive heart and chronic kidney disease with heart failure and stage 1 through stage 4 chronic kidney disease, or unspecified chronic kidney disease: Secondary | ICD-10-CM | POA: Diagnosis not present

## 2017-01-05 NOTE — Telephone Encounter (Signed)
Called [name] with [facility] regarding pt's right knee. Per Stacy pt's right knee wound on Monday  is  1 x1 cm in  size, red wound bed with irregular wound border. Slight odor was noted.  Dressed with xeroform and dry dressing. On Tuesday. No odor was detected, inflammation and warmth was noted peri-wound with a slight pink steak noted from knee to mid shin, pt had leg edema.Changed dressing to Aquacell silver and covered with dry dressing and gave pt an extra diuretic. Today there is no redness edema or odor or pain.

## 2017-01-05 NOTE — Telephone Encounter (Signed)
This encounter was created in error - please disregard.

## 2017-01-05 NOTE — Addendum Note (Signed)
Addended by: Corky Sox E on: 01/05/2017 05:03 PM   Modules accepted: Level of Service, SmartSet

## 2017-01-05 NOTE — Telephone Encounter (Signed)
Requesting Ativan refill for Noon dose (see office note 10/25/16)

## 2017-01-06 ENCOUNTER — Telehealth: Payer: Self-pay | Admitting: Family Medicine

## 2017-01-06 DIAGNOSIS — I130 Hypertensive heart and chronic kidney disease with heart failure and stage 1 through stage 4 chronic kidney disease, or unspecified chronic kidney disease: Secondary | ICD-10-CM | POA: Diagnosis not present

## 2017-01-06 DIAGNOSIS — E1121 Type 2 diabetes mellitus with diabetic nephropathy: Secondary | ICD-10-CM | POA: Diagnosis not present

## 2017-01-06 DIAGNOSIS — E1122 Type 2 diabetes mellitus with diabetic chronic kidney disease: Secondary | ICD-10-CM | POA: Diagnosis not present

## 2017-01-06 DIAGNOSIS — J9611 Chronic respiratory failure with hypoxia: Secondary | ICD-10-CM | POA: Diagnosis not present

## 2017-01-06 DIAGNOSIS — I5032 Chronic diastolic (congestive) heart failure: Secondary | ICD-10-CM | POA: Diagnosis not present

## 2017-01-06 DIAGNOSIS — N183 Chronic kidney disease, stage 3 (moderate): Secondary | ICD-10-CM | POA: Diagnosis not present

## 2017-01-06 MED ORDER — LORAZEPAM 1 MG PO TABS: 1.0000 mg | ORAL_TABLET | Freq: Every day | ORAL | 3 refills | Status: DC

## 2017-01-06 MED ORDER — LORAZEPAM 0.5 MG PO TABS: 0.5000 mg | ORAL_TABLET | Freq: Every day | ORAL | 3 refills | Status: DC

## 2017-01-06 NOTE — Telephone Encounter (Signed)
Copied from [place] [phone]. Topic: Inquiry >> Jan 05, 2017  1:18 PM [place], [place], [place] wrote: Reason for CRM: [name] called because she wanted to let Dr.Jessyca Sloan know that the patients right knee looks better today and also wanted to know if he has received a fax about treatment to the skin tear if he has any questions please give her a call back at [phone] >> Jan 05, 2017  1:27 PM Corie Chiquito, [place] wrote: Correction: [name] is from [facility]

## 2017-01-06 NOTE — Telephone Encounter (Signed)
Noted. I received plan of care review update.

## 2017-01-06 NOTE — Telephone Encounter (Signed)
I'm just getting this message now.  I'm glad wound is looking better.  lorazepam Rx printed and in Lisa's box - I'm having trouble with E prescribing.  plz fax to correct pharmacy

## 2017-01-06 NOTE — Telephone Encounter (Signed)
Spoke with Stacey relaying message per Dr. G.  Says ok. 

## 2017-01-06 NOTE — Addendum Note (Signed)
Addended by: Ria Bush on: 01/06/2017 01:54 PM   Modules accepted: Orders

## 2017-01-06 NOTE — Telephone Encounter (Signed)
Faxed rxs to [facility]

## 2017-01-07 DIAGNOSIS — N183 Chronic kidney disease, stage 3 (moderate): Secondary | ICD-10-CM | POA: Diagnosis not present

## 2017-01-07 DIAGNOSIS — I5032 Chronic diastolic (congestive) heart failure: Secondary | ICD-10-CM | POA: Diagnosis not present

## 2017-01-07 DIAGNOSIS — I130 Hypertensive heart and chronic kidney disease with heart failure and stage 1 through stage 4 chronic kidney disease, or unspecified chronic kidney disease: Secondary | ICD-10-CM | POA: Diagnosis not present

## 2017-01-07 DIAGNOSIS — E1122 Type 2 diabetes mellitus with diabetic chronic kidney disease: Secondary | ICD-10-CM | POA: Diagnosis not present

## 2017-01-07 DIAGNOSIS — J9611 Chronic respiratory failure with hypoxia: Secondary | ICD-10-CM | POA: Diagnosis not present

## 2017-01-07 DIAGNOSIS — E1121 Type 2 diabetes mellitus with diabetic nephropathy: Secondary | ICD-10-CM | POA: Diagnosis not present

## 2017-01-11 ENCOUNTER — Telehealth: Payer: Self-pay | Admitting: Family Medicine

## 2017-01-11 DIAGNOSIS — E1121 Type 2 diabetes mellitus with diabetic nephropathy: Secondary | ICD-10-CM | POA: Diagnosis not present

## 2017-01-11 DIAGNOSIS — N183 Chronic kidney disease, stage 3 (moderate): Secondary | ICD-10-CM | POA: Diagnosis not present

## 2017-01-11 DIAGNOSIS — J9611 Chronic respiratory failure with hypoxia: Secondary | ICD-10-CM | POA: Diagnosis not present

## 2017-01-11 DIAGNOSIS — E1122 Type 2 diabetes mellitus with diabetic chronic kidney disease: Secondary | ICD-10-CM | POA: Diagnosis not present

## 2017-01-11 DIAGNOSIS — I5032 Chronic diastolic (congestive) heart failure: Secondary | ICD-10-CM | POA: Diagnosis not present

## 2017-01-11 DIAGNOSIS — I130 Hypertensive heart and chronic kidney disease with heart failure and stage 1 through stage 4 chronic kidney disease, or unspecified chronic kidney disease: Secondary | ICD-10-CM | POA: Diagnosis not present

## 2017-01-11 NOTE — Telephone Encounter (Addendum)
Lamona Curl, RN for [facility] called requested additional dose of torsemide for pt; original order per Dr Danise Mina pt has daily dose "torsemide 20 mg (2 tablets every morning) and 1 extra tablet on Monday, Wednesday and Friday if needed", [name] would like an extra" dose for today because the pt has 2-3+ edema in legs, and more crackles in her bases"; pt also complains of abdominal tightness, fatique and shortness of breatht; [name] states that the abdominal girth is 89 cm and is usually 88 cm; Conference call initiated with Dr Larose Kells on call for [place]; Dr Larose Kells gave verbal order to [facility] to give 1 additional torsemide tablet now.

## 2017-01-12 ENCOUNTER — Other Ambulatory Visit: Payer: Self-pay | Admitting: Family Medicine

## 2017-01-12 ENCOUNTER — Telehealth: Payer: Self-pay

## 2017-01-12 DIAGNOSIS — I5032 Chronic diastolic (congestive) heart failure: Secondary | ICD-10-CM | POA: Diagnosis not present

## 2017-01-12 DIAGNOSIS — N183 Chronic kidney disease, stage 3 (moderate): Secondary | ICD-10-CM | POA: Diagnosis not present

## 2017-01-12 DIAGNOSIS — I130 Hypertensive heart and chronic kidney disease with heart failure and stage 1 through stage 4 chronic kidney disease, or unspecified chronic kidney disease: Secondary | ICD-10-CM | POA: Diagnosis not present

## 2017-01-12 DIAGNOSIS — J9611 Chronic respiratory failure with hypoxia: Secondary | ICD-10-CM | POA: Diagnosis not present

## 2017-01-12 DIAGNOSIS — E1121 Type 2 diabetes mellitus with diabetic nephropathy: Secondary | ICD-10-CM | POA: Diagnosis not present

## 2017-01-12 DIAGNOSIS — E1122 Type 2 diabetes mellitus with diabetic chronic kidney disease: Secondary | ICD-10-CM | POA: Diagnosis not present

## 2017-01-12 NOTE — Telephone Encounter (Signed)
So what do I need to do? Let me know

## 2017-01-12 NOTE — Telephone Encounter (Signed)
Spoke with Dewaine Oats with [name] (using return number provided) regarding patient's r/x needs.  She confirms that they have current r/x 's on file for patient's medications (lorazepam 0.5 and 1mg  and Tramadol).  Currently they do not need anything further.    Per our records both r/x's for lorazepam printed and faxed to pharmacy for #30, 3 refills.  Also, Tramadol #60, 3 refills sent in on 10/29/16.  Patient does not need refills on either at the present.    Per [name], patient insists her Tramadol continue to be filled through [facility] and does not wish to change that over to [place] which is apparently the pharmacy Hospice uses.

## 2017-01-12 NOTE — Telephone Encounter (Signed)
Agree  Thank you

## 2017-01-12 NOTE — Telephone Encounter (Signed)
Patient Name: Christine Blanchard Gender: Female DOB: Apr 28, 1928 Age: 83 Y 25 M 21 D Return Phone Number: [phone] (Primary) Address: City/State/Zip: [place] Client [place] Night - Client Client Site [place] Physician [place], NOT LISTED- MD Contact Type Call Who Is Calling Patient / Member / Family / Caregiver Call Type Triage / Clinical Caller Name Malissa Hippo Relationship To Patient Other Return Phone Number [phone] (Primary) Chief Complaint Prescription Refill or Medication Request (non symptomatic) Reason for Call Medication Question / Request Initial Comment Caller is needing a hard coies for percriptions tramadol HCL 150 mg and Lorazapam 0.5mg  Translation No Nurse Assessment Nurse: Joline Salt, RN, Malachy Mood Date/Time (Eastern Time): 01/08/2017 5:05:22 PM Confirm and document reason for call. If symptomatic, describe symptoms. ---Caller is needing a hard copies for prescriptions Tramadol HCL 150 mg and Lorazapam 0.5 mg Does the patient have any new or worsening symptoms? ---No Please document clinical information provided and list any resource used. ---Caller needs this for her Medication records. Hospice will pay for some of these charges. Guidelines Guideline Title Affirmed Question Affirmed Notes Nurse Date/Time (Eastern Time) Disp. Time Eilene Ghazi Time) Disposition Final User 01/08/2017 5:07:59 PM Clinical Call Yes Joline Salt, RN, Malachy Mood

## 2017-01-13 DIAGNOSIS — N183 Chronic kidney disease, stage 3 (moderate): Secondary | ICD-10-CM | POA: Diagnosis not present

## 2017-01-13 DIAGNOSIS — E1121 Type 2 diabetes mellitus with diabetic nephropathy: Secondary | ICD-10-CM | POA: Diagnosis not present

## 2017-01-13 DIAGNOSIS — I5032 Chronic diastolic (congestive) heart failure: Secondary | ICD-10-CM | POA: Diagnosis not present

## 2017-01-13 DIAGNOSIS — I130 Hypertensive heart and chronic kidney disease with heart failure and stage 1 through stage 4 chronic kidney disease, or unspecified chronic kidney disease: Secondary | ICD-10-CM | POA: Diagnosis not present

## 2017-01-13 DIAGNOSIS — J9611 Chronic respiratory failure with hypoxia: Secondary | ICD-10-CM | POA: Diagnosis not present

## 2017-01-13 DIAGNOSIS — E1122 Type 2 diabetes mellitus with diabetic chronic kidney disease: Secondary | ICD-10-CM | POA: Diagnosis not present

## 2017-01-13 NOTE — Telephone Encounter (Signed)
Nothing further needed.  I apologize, not sure why I routed this to you.  I have resolved the issue.    Thanks.

## 2017-01-14 DIAGNOSIS — E1122 Type 2 diabetes mellitus with diabetic chronic kidney disease: Secondary | ICD-10-CM | POA: Diagnosis not present

## 2017-01-14 DIAGNOSIS — I5032 Chronic diastolic (congestive) heart failure: Secondary | ICD-10-CM | POA: Diagnosis not present

## 2017-01-14 DIAGNOSIS — N183 Chronic kidney disease, stage 3 (moderate): Secondary | ICD-10-CM | POA: Diagnosis not present

## 2017-01-14 DIAGNOSIS — E1121 Type 2 diabetes mellitus with diabetic nephropathy: Secondary | ICD-10-CM | POA: Diagnosis not present

## 2017-01-14 DIAGNOSIS — J9611 Chronic respiratory failure with hypoxia: Secondary | ICD-10-CM | POA: Diagnosis not present

## 2017-01-14 DIAGNOSIS — I130 Hypertensive heart and chronic kidney disease with heart failure and stage 1 through stage 4 chronic kidney disease, or unspecified chronic kidney disease: Secondary | ICD-10-CM | POA: Diagnosis not present

## 2017-01-17 DIAGNOSIS — N183 Chronic kidney disease, stage 3 (moderate): Secondary | ICD-10-CM | POA: Diagnosis not present

## 2017-01-17 DIAGNOSIS — I130 Hypertensive heart and chronic kidney disease with heart failure and stage 1 through stage 4 chronic kidney disease, or unspecified chronic kidney disease: Secondary | ICD-10-CM | POA: Diagnosis not present

## 2017-01-17 DIAGNOSIS — E1122 Type 2 diabetes mellitus with diabetic chronic kidney disease: Secondary | ICD-10-CM | POA: Diagnosis not present

## 2017-01-17 DIAGNOSIS — I5032 Chronic diastolic (congestive) heart failure: Secondary | ICD-10-CM | POA: Diagnosis not present

## 2017-01-17 DIAGNOSIS — E1121 Type 2 diabetes mellitus with diabetic nephropathy: Secondary | ICD-10-CM | POA: Diagnosis not present

## 2017-01-17 DIAGNOSIS — J9611 Chronic respiratory failure with hypoxia: Secondary | ICD-10-CM | POA: Diagnosis not present

## 2017-01-18 DIAGNOSIS — E1121 Type 2 diabetes mellitus with diabetic nephropathy: Secondary | ICD-10-CM | POA: Diagnosis not present

## 2017-01-18 DIAGNOSIS — N183 Chronic kidney disease, stage 3 (moderate): Secondary | ICD-10-CM | POA: Diagnosis not present

## 2017-01-18 DIAGNOSIS — E1122 Type 2 diabetes mellitus with diabetic chronic kidney disease: Secondary | ICD-10-CM | POA: Diagnosis not present

## 2017-01-18 DIAGNOSIS — J9611 Chronic respiratory failure with hypoxia: Secondary | ICD-10-CM | POA: Diagnosis not present

## 2017-01-18 DIAGNOSIS — I5032 Chronic diastolic (congestive) heart failure: Secondary | ICD-10-CM | POA: Diagnosis not present

## 2017-01-18 DIAGNOSIS — I130 Hypertensive heart and chronic kidney disease with heart failure and stage 1 through stage 4 chronic kidney disease, or unspecified chronic kidney disease: Secondary | ICD-10-CM | POA: Diagnosis not present

## 2017-01-19 DIAGNOSIS — J9611 Chronic respiratory failure with hypoxia: Secondary | ICD-10-CM | POA: Diagnosis not present

## 2017-01-19 DIAGNOSIS — I5032 Chronic diastolic (congestive) heart failure: Secondary | ICD-10-CM | POA: Diagnosis not present

## 2017-01-19 DIAGNOSIS — I130 Hypertensive heart and chronic kidney disease with heart failure and stage 1 through stage 4 chronic kidney disease, or unspecified chronic kidney disease: Secondary | ICD-10-CM | POA: Diagnosis not present

## 2017-01-19 DIAGNOSIS — E1121 Type 2 diabetes mellitus with diabetic nephropathy: Secondary | ICD-10-CM | POA: Diagnosis not present

## 2017-01-19 DIAGNOSIS — N183 Chronic kidney disease, stage 3 (moderate): Secondary | ICD-10-CM | POA: Diagnosis not present

## 2017-01-19 DIAGNOSIS — E1122 Type 2 diabetes mellitus with diabetic chronic kidney disease: Secondary | ICD-10-CM | POA: Diagnosis not present

## 2017-01-20 ENCOUNTER — Ambulatory Visit (INDEPENDENT_AMBULATORY_CARE_PROVIDER_SITE_OTHER): Payer: Medicare Other | Admitting: Family Medicine

## 2017-01-20 ENCOUNTER — Encounter: Payer: Self-pay | Admitting: Family Medicine

## 2017-01-20 VITALS — BP 122/72 | HR 64 | Temp 97.9°F | Wt 136.2 lb

## 2017-01-20 DIAGNOSIS — N183 Chronic kidney disease, stage 3 unspecified: Secondary | ICD-10-CM

## 2017-01-20 DIAGNOSIS — F331 Major depressive disorder, recurrent, moderate: Secondary | ICD-10-CM | POA: Diagnosis not present

## 2017-01-20 DIAGNOSIS — E1121 Type 2 diabetes mellitus with diabetic nephropathy: Secondary | ICD-10-CM

## 2017-01-20 DIAGNOSIS — Z66 Do not resuscitate: Secondary | ICD-10-CM | POA: Diagnosis not present

## 2017-01-20 DIAGNOSIS — K219 Gastro-esophageal reflux disease without esophagitis: Secondary | ICD-10-CM | POA: Diagnosis not present

## 2017-01-20 DIAGNOSIS — I130 Hypertensive heart and chronic kidney disease with heart failure and stage 1 through stage 4 chronic kidney disease, or unspecified chronic kidney disease: Secondary | ICD-10-CM | POA: Diagnosis not present

## 2017-01-20 DIAGNOSIS — I25708 Atherosclerosis of coronary artery bypass graft(s), unspecified, with other forms of angina pectoris: Secondary | ICD-10-CM

## 2017-01-20 DIAGNOSIS — E1122 Type 2 diabetes mellitus with diabetic chronic kidney disease: Secondary | ICD-10-CM | POA: Diagnosis not present

## 2017-01-20 DIAGNOSIS — I5032 Chronic diastolic (congestive) heart failure: Secondary | ICD-10-CM | POA: Diagnosis not present

## 2017-01-20 DIAGNOSIS — J9611 Chronic respiratory failure with hypoxia: Secondary | ICD-10-CM | POA: Diagnosis not present

## 2017-01-20 DIAGNOSIS — Z515 Encounter for palliative care: Secondary | ICD-10-CM

## 2017-01-20 NOTE — Assessment & Plan Note (Signed)
Chronic, severe and inoperable. Increase in imdur has significantly helped anginal pain.

## 2017-01-20 NOTE — Progress Notes (Signed)
BP 122/72 (BP Location: Left Arm, Patient Position: Sitting, Cuff Size: Normal)   Pulse 64   Temp 97.9 F (36.6 C) (Oral)   Wt 136 lb 4 oz (61.8 kg)   SpO2 96% Comment: 2 L  BMI 23.03 kg/m    CC: 3 mo f/u visit Subjective:    Patient ID: Christine Blanchard, female    DOB: 03-26-1928, 83 y.o.   MRN: [id number]  HPI: Christine Blanchard is a 83 y.o. female presenting on 01/20/2017 for 3 mo follow-up   Hospice patient. Christmas lunch at [facility] today.   Lorazepam 0.53m and 140m(with 3 refills) [facility]  [facility].   Chest/abdominal pressure/tightness did improve after we increased imdur to 609maily.  GERD sxs despite aciphex and ranitidine so we started propranolol which has helped. R lower leg lesion present over the past year treating with dressing changes.   Relevant past medical, surgical, family and social history reviewed and updated as indicated. Interim medical history since our last visit reviewed. Allergies and medications reviewed and updated. Outpatient Medications Prior to Visit  Medication Sig Dispense Refill  . acetaminophen (TYLENOL) 500 MG tablet Take 500 mg by mouth 3 (three) times daily.    . ASPIRIN LOW DOSE 81 MG chewable tablet TAKE 1 TABLET BY MOUTH ONCE DAILY 30 tablet 6  . atorvastatin (LIPITOR) 20 MG tablet TAKE 1 TABLET NIGHTLY 90 tablet 0  . BD AUTOSHIELD DUO 30G X 5 MM MISC Use as instructed with insulin Dx: E11.21 100 each 3  . cetirizine (ZYRTEC) 10 MG tablet Take 1 tablet (10 mg total) by mouth daily as needed for allergies. 30 tablet 5  . clopidogrel (PLAVIX) 75 MG tablet TAKE 1 TABLET DAILY 90 tablet 3  . docusate sodium (COLACE) 100 MG capsule Take 1 capsule (100 mg total) by mouth daily. (Patient taking differently: Take 100 mg by mouth daily. Taking 1 tablet twice a day) 90 capsule 1  . glucose blood (FREESTYLE LITE) test strip Use to check sugar three times daily Dx:  250.40 100 each 3  . glucose monitoring kit (FREESTYLE) monitoring kit 1 each by Does not apply route as needed for other. 1 each 0  . isosorbide mononitrate (IMDUR) 60 MG 24 hr tablet Take 1 tablet (60 mg total) by mouth at bedtime. 90 tablet 1  . KLOR-CON M10 10 MEQ tablet TAKE 2 TABLETS (20 MEQ TOTAL) DAILY 180 tablet 1  . Lancets (FREESTYLE) lancets USE TO CHECK SUGAR THREE TIMES A DAY 300 each 2  . LANTUS SOLOSTAR 100 UNIT/ML Solostar Pen INJECT 10 UNITS UNDER THE SKIN DAILY WITH BREAKFAST (DIRECTION CHANGE) 15 mL 6  . LORazepam (ATIVAN) 0.5 MG tablet Take 1 tablet (0.5 mg total) by mouth daily. At noon 30 tablet 3  . LORazepam (ATIVAN) 1 MG tablet Take 1 tablet (1 mg total) by mouth at bedtime. At 10pm 30 tablet 3  . magnesium hydroxide (MILK OF MAGNESIA) 800 MG/5ML suspension Take by mouth daily as needed for constipation.    . meclizine (ANTIVERT) 12.5 MG tablet One tablet by mouth daily as needed for vertigo 30 tablet 0  . metFORMIN (GLUCOPHAGE-XR) 500 MG 24 hr tablet TAKE 1 TABLET AT BEDTIME 90 tablet 0  . metoprolol tartrate (LOPRESSOR) 25 MG tablet TAKE 1 TABLET TWICE A DAY 180 tablet 3  . Multiple Vitamin (MULTIVITAMIN) capsule Take 1 capsule by mouth daily.      .Marland Kitchen  nitroGLYCERIN (NITROSTAT) 0.4 MG SL tablet DISSOLVE 1 TABLET UNDER THE TONGUE AS NEEDED 20 tablet 1  . ondansetron (ZOFRAN) 4 MG tablet Take 1 tablet (4 mg total) by mouth 2 (two) times daily as needed for nausea or vomiting. 30 tablet 1  . pantoprazole (PROTONIX) 40 MG tablet Take 1 tablet (40 mg total) by mouth daily. 90 tablet 1  . prednisoLONE sodium phosphate (INFLAMASE FORTE) 1 % ophthalmic solution Place 1 drop into both eyes daily. 5 mL 3  . ranitidine (ZANTAC) 150 MG tablet Take 1 tablet (150 mg total) by mouth at bedtime. 90 tablet 1  . sertraline (ZOLOFT) 50 MG tablet TAKE 1 TABLET DAILY 90 tablet 0  . SPIRIVA HANDIHALER 18 MCG inhalation capsule INHALE THE CONTENTS OF 1 CAPSULE VIA HANDIHALER DAILY 90 capsule 2    . torsemide (DEMADEX) 20 MG tablet TAKE 2 TABLETS EVERY MORNING AND 1 EXTRA TABLET ON MONDAY, WEDNESDAY, AND FRIDAY IF NEEDED 225 tablet 1  . traMADol (ULTRAM) 50 MG tablet TAKE ONE TABLET BY MOUTH 2 TIMES A DAY AT 3PM AND 10PM 60 tablet 3  . traZODone (DESYREL) 50 MG tablet TAKE ONE-HALF (1/2) TO 1 TABLET AT BEDTIME AS NEEDED FOR SLEEP 90 tablet 1  . Vitamin D, Ergocalciferol, (DRISDOL) 50000 units CAPS capsule TAKE 1 CAPSULE EVERY WEEK 12 capsule 1  . RABEprazole (ACIPHEX) 20 MG tablet TAKE 1 TABLET DAILY 90 tablet 1   No facility-administered medications prior to visit.      Per HPI unless specifically indicated in ROS section below Review of Systems     Objective:    BP 122/72 (BP Location: Left Arm, Patient Position: Sitting, Cuff Size: Normal)   Pulse 64   Temp 97.9 F (36.6 C) (Oral)   Wt 136 lb 4 oz (61.8 kg)   SpO2 96% Comment: 2 L  BMI 23.03 kg/m   Wt Readings from Last 3 Encounters:  01/20/17 136 lb 4 oz (61.8 kg)  10/25/16 134 lb 4 oz (60.9 kg)  07/21/16 131 lb 8 oz (59.6 kg)    Physical Exam  Constitutional: She appears well-developed and well-nourished. No distress.  HENT:  Mouth/Throat: Oropharynx is clear and moist. No oropharyngeal exudate.  Cardiovascular: Normal rate, regular rhythm, normal heart sounds and intact distal pulses.  No murmur heard. Pulmonary/Chest: Effort normal and breath sounds normal. No respiratory distress. She has no wheezes. She has no rales.  Musculoskeletal: She exhibits edema (tr).  Scaly growth R anterior lower leg without significant surrounding erythema.   Nursing note and vitals reviewed.  Results for orders placed or performed in visit on 10/21/16  HM DIABETES EYE EXAM  Result Value Ref Range   HM Diabetic Eye Exam No Retinopathy No Retinopathy   Lab Results  Component Value Date   HGBA1C 7.4 (H) 07/21/2016    Lab Results  Component Value Date   CHOL 184 10/08/2015   HDL 49.20 10/08/2015   LDLCALC 95 10/08/2015    LDLDIRECT 78.0 04/25/2014   TRIG 199.0 (H) 10/08/2015   CHOLHDL 4 10/08/2015       Assessment & Plan:   Problem List Items Addressed This Visit    CAD (coronary artery disease) of artery bypass graft    Chronic, severe and inoperable. Increase in imdur has significantly helped anginal pain.       CKD (chronic kidney disease) stage 3, GFR 30-59 ml/min (HCC)   DNR (do not resuscitate)   GERD (gastroesophageal reflux disease)    Doing much  better on protonix in place of aciphex - continue.      Hospice care patient - Primary    Appreciate hospice care.      MDD (major depressive disorder), recurrent episode, moderate (HCC)    Chronic, stable. Continue sertraline 65m daily and lorazepam 0.553mat noon, 17m63mt bedtime.       Well controlled type 2 diabetes mellitus with nephropathy ([place]  Consider labs next visit.           Follow up plan: Return in about 4 months (around 05/21/2017) for follow up visit.  JavRia BushD

## 2017-01-20 NOTE — Assessment & Plan Note (Signed)
Consider labs next visit.

## 2017-01-20 NOTE — Assessment & Plan Note (Signed)
Chronic, stable. Continue sertraline 50mg  daily and lorazepam 0.5mg  at noon, 1mg  at bedtime.

## 2017-01-20 NOTE — Patient Instructions (Addendum)
No medicine changes today.  We will continue to watch spot on right leg. Continue daily dressing change Return as needed or in 3-4 months for f/u visit.  Merry Christmas!

## 2017-01-20 NOTE — Assessment & Plan Note (Signed)
Appreciate hospice care. 

## 2017-01-20 NOTE — Assessment & Plan Note (Signed)
Doing much better on protonix in place of aciphex - continue.

## 2017-01-21 DIAGNOSIS — I5032 Chronic diastolic (congestive) heart failure: Secondary | ICD-10-CM | POA: Diagnosis not present

## 2017-01-21 DIAGNOSIS — E1122 Type 2 diabetes mellitus with diabetic chronic kidney disease: Secondary | ICD-10-CM | POA: Diagnosis not present

## 2017-01-21 DIAGNOSIS — E1121 Type 2 diabetes mellitus with diabetic nephropathy: Secondary | ICD-10-CM | POA: Diagnosis not present

## 2017-01-21 DIAGNOSIS — J9611 Chronic respiratory failure with hypoxia: Secondary | ICD-10-CM | POA: Diagnosis not present

## 2017-01-21 DIAGNOSIS — I130 Hypertensive heart and chronic kidney disease with heart failure and stage 1 through stage 4 chronic kidney disease, or unspecified chronic kidney disease: Secondary | ICD-10-CM | POA: Diagnosis not present

## 2017-01-21 DIAGNOSIS — N183 Chronic kidney disease, stage 3 (moderate): Secondary | ICD-10-CM | POA: Diagnosis not present

## 2017-01-22 DIAGNOSIS — J9611 Chronic respiratory failure with hypoxia: Secondary | ICD-10-CM | POA: Diagnosis not present

## 2017-01-22 DIAGNOSIS — N183 Chronic kidney disease, stage 3 (moderate): Secondary | ICD-10-CM | POA: Diagnosis not present

## 2017-01-22 DIAGNOSIS — I130 Hypertensive heart and chronic kidney disease with heart failure and stage 1 through stage 4 chronic kidney disease, or unspecified chronic kidney disease: Secondary | ICD-10-CM | POA: Diagnosis not present

## 2017-01-22 DIAGNOSIS — E1121 Type 2 diabetes mellitus with diabetic nephropathy: Secondary | ICD-10-CM | POA: Diagnosis not present

## 2017-01-22 DIAGNOSIS — I5032 Chronic diastolic (congestive) heart failure: Secondary | ICD-10-CM | POA: Diagnosis not present

## 2017-01-22 DIAGNOSIS — E1122 Type 2 diabetes mellitus with diabetic chronic kidney disease: Secondary | ICD-10-CM | POA: Diagnosis not present

## 2017-01-24 DIAGNOSIS — E1122 Type 2 diabetes mellitus with diabetic chronic kidney disease: Secondary | ICD-10-CM | POA: Diagnosis not present

## 2017-01-24 DIAGNOSIS — N183 Chronic kidney disease, stage 3 (moderate): Secondary | ICD-10-CM | POA: Diagnosis not present

## 2017-01-24 DIAGNOSIS — E1121 Type 2 diabetes mellitus with diabetic nephropathy: Secondary | ICD-10-CM | POA: Diagnosis not present

## 2017-01-24 DIAGNOSIS — I5032 Chronic diastolic (congestive) heart failure: Secondary | ICD-10-CM | POA: Diagnosis not present

## 2017-01-24 DIAGNOSIS — J9611 Chronic respiratory failure with hypoxia: Secondary | ICD-10-CM | POA: Diagnosis not present

## 2017-01-24 DIAGNOSIS — I130 Hypertensive heart and chronic kidney disease with heart failure and stage 1 through stage 4 chronic kidney disease, or unspecified chronic kidney disease: Secondary | ICD-10-CM | POA: Diagnosis not present

## 2017-01-25 DIAGNOSIS — N183 Chronic kidney disease, stage 3 (moderate): Secondary | ICD-10-CM | POA: Diagnosis not present

## 2017-01-25 DIAGNOSIS — I130 Hypertensive heart and chronic kidney disease with heart failure and stage 1 through stage 4 chronic kidney disease, or unspecified chronic kidney disease: Secondary | ICD-10-CM | POA: Diagnosis not present

## 2017-01-25 DIAGNOSIS — E1122 Type 2 diabetes mellitus with diabetic chronic kidney disease: Secondary | ICD-10-CM | POA: Diagnosis not present

## 2017-01-25 DIAGNOSIS — I5032 Chronic diastolic (congestive) heart failure: Secondary | ICD-10-CM | POA: Diagnosis not present

## 2017-01-25 DIAGNOSIS — E1121 Type 2 diabetes mellitus with diabetic nephropathy: Secondary | ICD-10-CM | POA: Diagnosis not present

## 2017-01-25 DIAGNOSIS — J9611 Chronic respiratory failure with hypoxia: Secondary | ICD-10-CM | POA: Diagnosis not present

## 2017-01-26 DIAGNOSIS — J9611 Chronic respiratory failure with hypoxia: Secondary | ICD-10-CM | POA: Diagnosis not present

## 2017-01-26 DIAGNOSIS — I5032 Chronic diastolic (congestive) heart failure: Secondary | ICD-10-CM | POA: Diagnosis not present

## 2017-01-26 DIAGNOSIS — N183 Chronic kidney disease, stage 3 (moderate): Secondary | ICD-10-CM | POA: Diagnosis not present

## 2017-01-26 DIAGNOSIS — E1121 Type 2 diabetes mellitus with diabetic nephropathy: Secondary | ICD-10-CM | POA: Diagnosis not present

## 2017-01-26 DIAGNOSIS — I130 Hypertensive heart and chronic kidney disease with heart failure and stage 1 through stage 4 chronic kidney disease, or unspecified chronic kidney disease: Secondary | ICD-10-CM | POA: Diagnosis not present

## 2017-01-26 DIAGNOSIS — E1122 Type 2 diabetes mellitus with diabetic chronic kidney disease: Secondary | ICD-10-CM | POA: Diagnosis not present

## 2017-01-27 DIAGNOSIS — I5032 Chronic diastolic (congestive) heart failure: Secondary | ICD-10-CM | POA: Diagnosis not present

## 2017-01-27 DIAGNOSIS — N183 Chronic kidney disease, stage 3 (moderate): Secondary | ICD-10-CM | POA: Diagnosis not present

## 2017-01-27 DIAGNOSIS — J9611 Chronic respiratory failure with hypoxia: Secondary | ICD-10-CM | POA: Diagnosis not present

## 2017-01-27 DIAGNOSIS — I130 Hypertensive heart and chronic kidney disease with heart failure and stage 1 through stage 4 chronic kidney disease, or unspecified chronic kidney disease: Secondary | ICD-10-CM | POA: Diagnosis not present

## 2017-01-27 DIAGNOSIS — E1122 Type 2 diabetes mellitus with diabetic chronic kidney disease: Secondary | ICD-10-CM | POA: Diagnosis not present

## 2017-01-27 DIAGNOSIS — E1121 Type 2 diabetes mellitus with diabetic nephropathy: Secondary | ICD-10-CM | POA: Diagnosis not present

## 2017-01-28 DIAGNOSIS — J9611 Chronic respiratory failure with hypoxia: Secondary | ICD-10-CM | POA: Diagnosis not present

## 2017-01-28 DIAGNOSIS — E1122 Type 2 diabetes mellitus with diabetic chronic kidney disease: Secondary | ICD-10-CM | POA: Diagnosis not present

## 2017-01-28 DIAGNOSIS — I130 Hypertensive heart and chronic kidney disease with heart failure and stage 1 through stage 4 chronic kidney disease, or unspecified chronic kidney disease: Secondary | ICD-10-CM | POA: Diagnosis not present

## 2017-01-28 DIAGNOSIS — E1121 Type 2 diabetes mellitus with diabetic nephropathy: Secondary | ICD-10-CM | POA: Diagnosis not present

## 2017-01-28 DIAGNOSIS — I5032 Chronic diastolic (congestive) heart failure: Secondary | ICD-10-CM | POA: Diagnosis not present

## 2017-01-28 DIAGNOSIS — N183 Chronic kidney disease, stage 3 (moderate): Secondary | ICD-10-CM | POA: Diagnosis not present

## 2017-01-31 DIAGNOSIS — J9611 Chronic respiratory failure with hypoxia: Secondary | ICD-10-CM | POA: Diagnosis not present

## 2017-01-31 DIAGNOSIS — I5032 Chronic diastolic (congestive) heart failure: Secondary | ICD-10-CM | POA: Diagnosis not present

## 2017-01-31 DIAGNOSIS — N183 Chronic kidney disease, stage 3 (moderate): Secondary | ICD-10-CM | POA: Diagnosis not present

## 2017-01-31 DIAGNOSIS — E1121 Type 2 diabetes mellitus with diabetic nephropathy: Secondary | ICD-10-CM | POA: Diagnosis not present

## 2017-01-31 DIAGNOSIS — E1122 Type 2 diabetes mellitus with diabetic chronic kidney disease: Secondary | ICD-10-CM | POA: Diagnosis not present

## 2017-01-31 DIAGNOSIS — I130 Hypertensive heart and chronic kidney disease with heart failure and stage 1 through stage 4 chronic kidney disease, or unspecified chronic kidney disease: Secondary | ICD-10-CM | POA: Diagnosis not present

## 2017-02-01 DIAGNOSIS — I5032 Chronic diastolic (congestive) heart failure: Secondary | ICD-10-CM | POA: Diagnosis not present

## 2017-02-01 DIAGNOSIS — I25708 Atherosclerosis of coronary artery bypass graft(s), unspecified, with other forms of angina pectoris: Secondary | ICD-10-CM | POA: Diagnosis not present

## 2017-02-01 DIAGNOSIS — N183 Chronic kidney disease, stage 3 (moderate): Secondary | ICD-10-CM | POA: Diagnosis not present

## 2017-02-01 DIAGNOSIS — Z7984 Long term (current) use of oral hypoglycemic drugs: Secondary | ICD-10-CM | POA: Diagnosis not present

## 2017-02-01 DIAGNOSIS — R531 Weakness: Secondary | ICD-10-CM | POA: Diagnosis not present

## 2017-02-01 DIAGNOSIS — E785 Hyperlipidemia, unspecified: Secondary | ICD-10-CM | POA: Diagnosis not present

## 2017-02-01 DIAGNOSIS — I130 Hypertensive heart and chronic kidney disease with heart failure and stage 1 through stage 4 chronic kidney disease, or unspecified chronic kidney disease: Secondary | ICD-10-CM | POA: Diagnosis not present

## 2017-02-01 DIAGNOSIS — Z9181 History of falling: Secondary | ICD-10-CM | POA: Diagnosis not present

## 2017-02-01 DIAGNOSIS — Z7902 Long term (current) use of antithrombotics/antiplatelets: Secondary | ICD-10-CM | POA: Diagnosis not present

## 2017-02-01 DIAGNOSIS — E1121 Type 2 diabetes mellitus with diabetic nephropathy: Secondary | ICD-10-CM | POA: Diagnosis not present

## 2017-02-01 DIAGNOSIS — Z7901 Long term (current) use of anticoagulants: Secondary | ICD-10-CM | POA: Diagnosis not present

## 2017-02-01 DIAGNOSIS — J9611 Chronic respiratory failure with hypoxia: Secondary | ICD-10-CM | POA: Diagnosis not present

## 2017-02-01 DIAGNOSIS — E1122 Type 2 diabetes mellitus with diabetic chronic kidney disease: Secondary | ICD-10-CM | POA: Diagnosis not present

## 2017-02-01 DIAGNOSIS — L03115 Cellulitis of right lower limb: Secondary | ICD-10-CM | POA: Diagnosis not present

## 2017-02-01 DIAGNOSIS — Z9981 Dependence on supplemental oxygen: Secondary | ICD-10-CM | POA: Diagnosis not present

## 2017-02-02 DIAGNOSIS — J9611 Chronic respiratory failure with hypoxia: Secondary | ICD-10-CM | POA: Diagnosis not present

## 2017-02-02 DIAGNOSIS — E1121 Type 2 diabetes mellitus with diabetic nephropathy: Secondary | ICD-10-CM | POA: Diagnosis not present

## 2017-02-02 DIAGNOSIS — E1122 Type 2 diabetes mellitus with diabetic chronic kidney disease: Secondary | ICD-10-CM | POA: Diagnosis not present

## 2017-02-02 DIAGNOSIS — I5032 Chronic diastolic (congestive) heart failure: Secondary | ICD-10-CM | POA: Diagnosis not present

## 2017-02-02 DIAGNOSIS — I130 Hypertensive heart and chronic kidney disease with heart failure and stage 1 through stage 4 chronic kidney disease, or unspecified chronic kidney disease: Secondary | ICD-10-CM | POA: Diagnosis not present

## 2017-02-02 DIAGNOSIS — N183 Chronic kidney disease, stage 3 (moderate): Secondary | ICD-10-CM | POA: Diagnosis not present

## 2017-02-03 DIAGNOSIS — E1122 Type 2 diabetes mellitus with diabetic chronic kidney disease: Secondary | ICD-10-CM | POA: Diagnosis not present

## 2017-02-03 DIAGNOSIS — J9611 Chronic respiratory failure with hypoxia: Secondary | ICD-10-CM | POA: Diagnosis not present

## 2017-02-03 DIAGNOSIS — E1121 Type 2 diabetes mellitus with diabetic nephropathy: Secondary | ICD-10-CM | POA: Diagnosis not present

## 2017-02-03 DIAGNOSIS — I5032 Chronic diastolic (congestive) heart failure: Secondary | ICD-10-CM | POA: Diagnosis not present

## 2017-02-03 DIAGNOSIS — I130 Hypertensive heart and chronic kidney disease with heart failure and stage 1 through stage 4 chronic kidney disease, or unspecified chronic kidney disease: Secondary | ICD-10-CM | POA: Diagnosis not present

## 2017-02-03 DIAGNOSIS — N183 Chronic kidney disease, stage 3 (moderate): Secondary | ICD-10-CM | POA: Diagnosis not present

## 2017-02-04 ENCOUNTER — Telehealth: Payer: Self-pay | Admitting: Internal Medicine

## 2017-02-04 DIAGNOSIS — E1122 Type 2 diabetes mellitus with diabetic chronic kidney disease: Secondary | ICD-10-CM | POA: Diagnosis not present

## 2017-02-04 DIAGNOSIS — E1121 Type 2 diabetes mellitus with diabetic nephropathy: Secondary | ICD-10-CM | POA: Diagnosis not present

## 2017-02-04 DIAGNOSIS — N183 Chronic kidney disease, stage 3 (moderate): Secondary | ICD-10-CM | POA: Diagnosis not present

## 2017-02-04 DIAGNOSIS — J9611 Chronic respiratory failure with hypoxia: Secondary | ICD-10-CM | POA: Diagnosis not present

## 2017-02-04 DIAGNOSIS — I130 Hypertensive heart and chronic kidney disease with heart failure and stage 1 through stage 4 chronic kidney disease, or unspecified chronic kidney disease: Secondary | ICD-10-CM | POA: Diagnosis not present

## 2017-02-04 DIAGNOSIS — I5032 Chronic diastolic (congestive) heart failure: Secondary | ICD-10-CM | POA: Diagnosis not present

## 2017-02-04 NOTE — Telephone Encounter (Signed)
Per Patient request deleting recall she is in hospice care

## 2017-02-05 ENCOUNTER — Other Ambulatory Visit: Payer: Self-pay | Admitting: Family Medicine

## 2017-02-06 DIAGNOSIS — N183 Chronic kidney disease, stage 3 (moderate): Secondary | ICD-10-CM | POA: Diagnosis not present

## 2017-02-06 DIAGNOSIS — J9611 Chronic respiratory failure with hypoxia: Secondary | ICD-10-CM | POA: Diagnosis not present

## 2017-02-06 DIAGNOSIS — E1121 Type 2 diabetes mellitus with diabetic nephropathy: Secondary | ICD-10-CM | POA: Diagnosis not present

## 2017-02-06 DIAGNOSIS — I5032 Chronic diastolic (congestive) heart failure: Secondary | ICD-10-CM | POA: Diagnosis not present

## 2017-02-06 DIAGNOSIS — I130 Hypertensive heart and chronic kidney disease with heart failure and stage 1 through stage 4 chronic kidney disease, or unspecified chronic kidney disease: Secondary | ICD-10-CM | POA: Diagnosis not present

## 2017-02-06 DIAGNOSIS — E1122 Type 2 diabetes mellitus with diabetic chronic kidney disease: Secondary | ICD-10-CM | POA: Diagnosis not present

## 2017-02-07 ENCOUNTER — Telehealth: Payer: Self-pay | Admitting: Family Medicine

## 2017-02-07 DIAGNOSIS — I5032 Chronic diastolic (congestive) heart failure: Secondary | ICD-10-CM | POA: Diagnosis not present

## 2017-02-07 DIAGNOSIS — E1122 Type 2 diabetes mellitus with diabetic chronic kidney disease: Secondary | ICD-10-CM | POA: Diagnosis not present

## 2017-02-07 DIAGNOSIS — N183 Chronic kidney disease, stage 3 (moderate): Secondary | ICD-10-CM | POA: Diagnosis not present

## 2017-02-07 DIAGNOSIS — J9611 Chronic respiratory failure with hypoxia: Secondary | ICD-10-CM | POA: Diagnosis not present

## 2017-02-07 DIAGNOSIS — I130 Hypertensive heart and chronic kidney disease with heart failure and stage 1 through stage 4 chronic kidney disease, or unspecified chronic kidney disease: Secondary | ICD-10-CM | POA: Diagnosis not present

## 2017-02-07 DIAGNOSIS — E1121 Type 2 diabetes mellitus with diabetic nephropathy: Secondary | ICD-10-CM | POA: Diagnosis not present

## 2017-02-07 NOTE — Telephone Encounter (Signed)
Last office visit 01/20/17 Last refill 01/06/17 #30/3 See note about hard copy.

## 2017-02-07 NOTE — Telephone Encounter (Signed)
Copied from [place]. Topic: Quick Communication - See Telephone Encounter >> Feb 07, 2017 12:28 PM Bea Graff, NT wrote: CRM for notification. See Telephone encounter for: Pt needing a hard copy of prescriptions LORazepam (ATIVAN) 0.5mg  and 1.0mg .   02/07/17.

## 2017-02-07 NOTE — Telephone Encounter (Signed)
Requesting refill of Ativan  LOV 01/20/17 with Dr. Danise Mina

## 2017-02-08 DIAGNOSIS — I130 Hypertensive heart and chronic kidney disease with heart failure and stage 1 through stage 4 chronic kidney disease, or unspecified chronic kidney disease: Secondary | ICD-10-CM | POA: Diagnosis not present

## 2017-02-08 DIAGNOSIS — N183 Chronic kidney disease, stage 3 (moderate): Secondary | ICD-10-CM | POA: Diagnosis not present

## 2017-02-08 DIAGNOSIS — J9611 Chronic respiratory failure with hypoxia: Secondary | ICD-10-CM | POA: Diagnosis not present

## 2017-02-08 DIAGNOSIS — I5032 Chronic diastolic (congestive) heart failure: Secondary | ICD-10-CM | POA: Diagnosis not present

## 2017-02-08 DIAGNOSIS — E1122 Type 2 diabetes mellitus with diabetic chronic kidney disease: Secondary | ICD-10-CM | POA: Diagnosis not present

## 2017-02-08 DIAGNOSIS — E1121 Type 2 diabetes mellitus with diabetic nephropathy: Secondary | ICD-10-CM | POA: Diagnosis not present

## 2017-02-09 DIAGNOSIS — N183 Chronic kidney disease, stage 3 (moderate): Secondary | ICD-10-CM | POA: Diagnosis not present

## 2017-02-09 DIAGNOSIS — E1121 Type 2 diabetes mellitus with diabetic nephropathy: Secondary | ICD-10-CM | POA: Diagnosis not present

## 2017-02-09 DIAGNOSIS — I130 Hypertensive heart and chronic kidney disease with heart failure and stage 1 through stage 4 chronic kidney disease, or unspecified chronic kidney disease: Secondary | ICD-10-CM | POA: Diagnosis not present

## 2017-02-09 DIAGNOSIS — E1122 Type 2 diabetes mellitus with diabetic chronic kidney disease: Secondary | ICD-10-CM | POA: Diagnosis not present

## 2017-02-09 DIAGNOSIS — I5032 Chronic diastolic (congestive) heart failure: Secondary | ICD-10-CM | POA: Diagnosis not present

## 2017-02-09 DIAGNOSIS — J9611 Chronic respiratory failure with hypoxia: Secondary | ICD-10-CM | POA: Diagnosis not present

## 2017-02-09 NOTE — Telephone Encounter (Signed)
Spoke with [name] to discuss lorazepam requests.  Says they received rxs from [place] today.  Explained there should be one more refill for next month. Says ok.

## 2017-02-09 NOTE — Telephone Encounter (Signed)
This is a chronic issue we've had with patient and her ativan refills at [facility]. #30 with 3 refills was printed 01/06/2017. This was faxed to [facility] on 01/06/2017. What happened to this script?

## 2017-02-10 DIAGNOSIS — E1122 Type 2 diabetes mellitus with diabetic chronic kidney disease: Secondary | ICD-10-CM | POA: Diagnosis not present

## 2017-02-10 DIAGNOSIS — E1121 Type 2 diabetes mellitus with diabetic nephropathy: Secondary | ICD-10-CM | POA: Diagnosis not present

## 2017-02-10 DIAGNOSIS — N183 Chronic kidney disease, stage 3 (moderate): Secondary | ICD-10-CM | POA: Diagnosis not present

## 2017-02-10 DIAGNOSIS — I5032 Chronic diastolic (congestive) heart failure: Secondary | ICD-10-CM | POA: Diagnosis not present

## 2017-02-10 DIAGNOSIS — J9611 Chronic respiratory failure with hypoxia: Secondary | ICD-10-CM | POA: Diagnosis not present

## 2017-02-10 DIAGNOSIS — I130 Hypertensive heart and chronic kidney disease with heart failure and stage 1 through stage 4 chronic kidney disease, or unspecified chronic kidney disease: Secondary | ICD-10-CM | POA: Diagnosis not present

## 2017-02-11 DIAGNOSIS — I5032 Chronic diastolic (congestive) heart failure: Secondary | ICD-10-CM | POA: Diagnosis not present

## 2017-02-11 DIAGNOSIS — N183 Chronic kidney disease, stage 3 (moderate): Secondary | ICD-10-CM | POA: Diagnosis not present

## 2017-02-11 DIAGNOSIS — E1122 Type 2 diabetes mellitus with diabetic chronic kidney disease: Secondary | ICD-10-CM | POA: Diagnosis not present

## 2017-02-11 DIAGNOSIS — E1121 Type 2 diabetes mellitus with diabetic nephropathy: Secondary | ICD-10-CM | POA: Diagnosis not present

## 2017-02-11 DIAGNOSIS — J9611 Chronic respiratory failure with hypoxia: Secondary | ICD-10-CM | POA: Diagnosis not present

## 2017-02-11 DIAGNOSIS — I130 Hypertensive heart and chronic kidney disease with heart failure and stage 1 through stage 4 chronic kidney disease, or unspecified chronic kidney disease: Secondary | ICD-10-CM | POA: Diagnosis not present

## 2017-02-14 DIAGNOSIS — N183 Chronic kidney disease, stage 3 (moderate): Secondary | ICD-10-CM | POA: Diagnosis not present

## 2017-02-14 DIAGNOSIS — E1122 Type 2 diabetes mellitus with diabetic chronic kidney disease: Secondary | ICD-10-CM | POA: Diagnosis not present

## 2017-02-14 DIAGNOSIS — E1121 Type 2 diabetes mellitus with diabetic nephropathy: Secondary | ICD-10-CM | POA: Diagnosis not present

## 2017-02-14 DIAGNOSIS — I5032 Chronic diastolic (congestive) heart failure: Secondary | ICD-10-CM | POA: Diagnosis not present

## 2017-02-14 DIAGNOSIS — I130 Hypertensive heart and chronic kidney disease with heart failure and stage 1 through stage 4 chronic kidney disease, or unspecified chronic kidney disease: Secondary | ICD-10-CM | POA: Diagnosis not present

## 2017-02-14 DIAGNOSIS — J9611 Chronic respiratory failure with hypoxia: Secondary | ICD-10-CM | POA: Diagnosis not present

## 2017-02-15 DIAGNOSIS — J9611 Chronic respiratory failure with hypoxia: Secondary | ICD-10-CM | POA: Diagnosis not present

## 2017-02-15 DIAGNOSIS — E1122 Type 2 diabetes mellitus with diabetic chronic kidney disease: Secondary | ICD-10-CM | POA: Diagnosis not present

## 2017-02-15 DIAGNOSIS — I5032 Chronic diastolic (congestive) heart failure: Secondary | ICD-10-CM | POA: Diagnosis not present

## 2017-02-15 DIAGNOSIS — I130 Hypertensive heart and chronic kidney disease with heart failure and stage 1 through stage 4 chronic kidney disease, or unspecified chronic kidney disease: Secondary | ICD-10-CM | POA: Diagnosis not present

## 2017-02-15 DIAGNOSIS — N183 Chronic kidney disease, stage 3 (moderate): Secondary | ICD-10-CM | POA: Diagnosis not present

## 2017-02-15 DIAGNOSIS — E1121 Type 2 diabetes mellitus with diabetic nephropathy: Secondary | ICD-10-CM | POA: Diagnosis not present

## 2017-02-16 DIAGNOSIS — E1121 Type 2 diabetes mellitus with diabetic nephropathy: Secondary | ICD-10-CM | POA: Diagnosis not present

## 2017-02-16 DIAGNOSIS — N183 Chronic kidney disease, stage 3 (moderate): Secondary | ICD-10-CM | POA: Diagnosis not present

## 2017-02-16 DIAGNOSIS — E1122 Type 2 diabetes mellitus with diabetic chronic kidney disease: Secondary | ICD-10-CM | POA: Diagnosis not present

## 2017-02-16 DIAGNOSIS — I5032 Chronic diastolic (congestive) heart failure: Secondary | ICD-10-CM | POA: Diagnosis not present

## 2017-02-16 DIAGNOSIS — I130 Hypertensive heart and chronic kidney disease with heart failure and stage 1 through stage 4 chronic kidney disease, or unspecified chronic kidney disease: Secondary | ICD-10-CM | POA: Diagnosis not present

## 2017-02-16 DIAGNOSIS — J9611 Chronic respiratory failure with hypoxia: Secondary | ICD-10-CM | POA: Diagnosis not present

## 2017-02-17 DIAGNOSIS — E1122 Type 2 diabetes mellitus with diabetic chronic kidney disease: Secondary | ICD-10-CM | POA: Diagnosis not present

## 2017-02-17 DIAGNOSIS — E1121 Type 2 diabetes mellitus with diabetic nephropathy: Secondary | ICD-10-CM | POA: Diagnosis not present

## 2017-02-17 DIAGNOSIS — J9611 Chronic respiratory failure with hypoxia: Secondary | ICD-10-CM | POA: Diagnosis not present

## 2017-02-17 DIAGNOSIS — I5032 Chronic diastolic (congestive) heart failure: Secondary | ICD-10-CM | POA: Diagnosis not present

## 2017-02-17 DIAGNOSIS — I130 Hypertensive heart and chronic kidney disease with heart failure and stage 1 through stage 4 chronic kidney disease, or unspecified chronic kidney disease: Secondary | ICD-10-CM | POA: Diagnosis not present

## 2017-02-17 DIAGNOSIS — N183 Chronic kidney disease, stage 3 (moderate): Secondary | ICD-10-CM | POA: Diagnosis not present

## 2017-02-18 DIAGNOSIS — J9611 Chronic respiratory failure with hypoxia: Secondary | ICD-10-CM | POA: Diagnosis not present

## 2017-02-18 DIAGNOSIS — N183 Chronic kidney disease, stage 3 (moderate): Secondary | ICD-10-CM | POA: Diagnosis not present

## 2017-02-18 DIAGNOSIS — I130 Hypertensive heart and chronic kidney disease with heart failure and stage 1 through stage 4 chronic kidney disease, or unspecified chronic kidney disease: Secondary | ICD-10-CM | POA: Diagnosis not present

## 2017-02-18 DIAGNOSIS — E1121 Type 2 diabetes mellitus with diabetic nephropathy: Secondary | ICD-10-CM | POA: Diagnosis not present

## 2017-02-18 DIAGNOSIS — I5032 Chronic diastolic (congestive) heart failure: Secondary | ICD-10-CM | POA: Diagnosis not present

## 2017-02-18 DIAGNOSIS — E1122 Type 2 diabetes mellitus with diabetic chronic kidney disease: Secondary | ICD-10-CM | POA: Diagnosis not present

## 2017-02-21 ENCOUNTER — Telehealth: Payer: Self-pay | Admitting: Family Medicine

## 2017-02-21 DIAGNOSIS — I130 Hypertensive heart and chronic kidney disease with heart failure and stage 1 through stage 4 chronic kidney disease, or unspecified chronic kidney disease: Secondary | ICD-10-CM | POA: Diagnosis not present

## 2017-02-21 DIAGNOSIS — J9611 Chronic respiratory failure with hypoxia: Secondary | ICD-10-CM | POA: Diagnosis not present

## 2017-02-21 DIAGNOSIS — I5032 Chronic diastolic (congestive) heart failure: Secondary | ICD-10-CM | POA: Diagnosis not present

## 2017-02-21 DIAGNOSIS — N183 Chronic kidney disease, stage 3 (moderate): Secondary | ICD-10-CM | POA: Diagnosis not present

## 2017-02-21 DIAGNOSIS — E1121 Type 2 diabetes mellitus with diabetic nephropathy: Secondary | ICD-10-CM | POA: Diagnosis not present

## 2017-02-21 DIAGNOSIS — E1122 Type 2 diabetes mellitus with diabetic chronic kidney disease: Secondary | ICD-10-CM | POA: Diagnosis not present

## 2017-02-21 NOTE — Telephone Encounter (Signed)
Requesting hard copy of Lorazepam 0.5 mg and 1 mg tablets, will be picked up.

## 2017-02-21 NOTE — Telephone Encounter (Signed)
Copied from [place] [id number]. Topic: Quick Communication - Rx Refill/Question >> Feb 21, 2017  3:58 PM Patrice Paradise wrote: Medication:  LORazepam (ATIVAN) 0.5 MG tablet LORazepam (ATIVAN) 1 MG tablet   Blakery Litsey calling for a hard copy of the above meds and Georgiann Cocker will pick up the copies

## 2017-02-22 DIAGNOSIS — N183 Chronic kidney disease, stage 3 (moderate): Secondary | ICD-10-CM | POA: Diagnosis not present

## 2017-02-22 DIAGNOSIS — E1121 Type 2 diabetes mellitus with diabetic nephropathy: Secondary | ICD-10-CM | POA: Diagnosis not present

## 2017-02-22 DIAGNOSIS — J9611 Chronic respiratory failure with hypoxia: Secondary | ICD-10-CM | POA: Diagnosis not present

## 2017-02-22 DIAGNOSIS — E1122 Type 2 diabetes mellitus with diabetic chronic kidney disease: Secondary | ICD-10-CM | POA: Diagnosis not present

## 2017-02-22 DIAGNOSIS — I5032 Chronic diastolic (congestive) heart failure: Secondary | ICD-10-CM | POA: Diagnosis not present

## 2017-02-22 DIAGNOSIS — I130 Hypertensive heart and chronic kidney disease with heart failure and stage 1 through stage 4 chronic kidney disease, or unspecified chronic kidney disease: Secondary | ICD-10-CM | POA: Diagnosis not present

## 2017-02-22 NOTE — Telephone Encounter (Signed)
I spoke with Candace at [facility] and pt has available refills on lorazepam 0.5 mg and 1 mg. I then spoke with Georgiann Cocker at [facility] and she is not sure who called for refills; Vaughan Basta will contact [facility] for refills of meds. Nothing further needed.

## 2017-02-22 NOTE — Telephone Encounter (Signed)
Ed with [name] came to pick lorazepam 0.5 mg and 1 mg rx. Ed advised that [employer] sent him to pick up lorazepam rxs. Advised Vaughan Basta was notified today at 8:45 that pt had available refills at [facility] and there were no rx to pick up. Ed said he called [name] and he was sent on wild goose chase.

## 2017-02-23 ENCOUNTER — Telehealth: Payer: Self-pay | Admitting: Family Medicine

## 2017-02-23 DIAGNOSIS — I130 Hypertensive heart and chronic kidney disease with heart failure and stage 1 through stage 4 chronic kidney disease, or unspecified chronic kidney disease: Secondary | ICD-10-CM | POA: Diagnosis not present

## 2017-02-23 DIAGNOSIS — I5032 Chronic diastolic (congestive) heart failure: Secondary | ICD-10-CM | POA: Diagnosis not present

## 2017-02-23 DIAGNOSIS — E1121 Type 2 diabetes mellitus with diabetic nephropathy: Secondary | ICD-10-CM | POA: Diagnosis not present

## 2017-02-23 DIAGNOSIS — J9611 Chronic respiratory failure with hypoxia: Secondary | ICD-10-CM | POA: Diagnosis not present

## 2017-02-23 DIAGNOSIS — N183 Chronic kidney disease, stage 3 (moderate): Secondary | ICD-10-CM | POA: Diagnosis not present

## 2017-02-23 DIAGNOSIS — E1122 Type 2 diabetes mellitus with diabetic chronic kidney disease: Secondary | ICD-10-CM | POA: Diagnosis not present

## 2017-02-23 MED ORDER — TRAMADOL HCL 50 MG PO TABS: ORAL_TABLET | ORAL | 3 refills | Status: DC

## 2017-02-23 NOTE — Telephone Encounter (Signed)
Copied from [place]. Topic: Quick Communication - See Telephone Encounter >> Feb 23, 2017 10:19 AM Ahmed Prima L wrote: CRM for notification. See Telephone encounter for:   02/23/17.   [facility] needs renewal on her traMADol (ULTRAM) 50 MG tablet Call back is [phone] reference [id number]  Please call by friday

## 2017-02-23 NOTE — Telephone Encounter (Signed)
[facility].

## 2017-02-23 NOTE — Telephone Encounter (Signed)
Last office visit 01/20/17 Last refill 10/29/16 #60/3

## 2017-02-24 DIAGNOSIS — I5032 Chronic diastolic (congestive) heart failure: Secondary | ICD-10-CM | POA: Diagnosis not present

## 2017-02-24 DIAGNOSIS — E1122 Type 2 diabetes mellitus with diabetic chronic kidney disease: Secondary | ICD-10-CM | POA: Diagnosis not present

## 2017-02-24 DIAGNOSIS — I130 Hypertensive heart and chronic kidney disease with heart failure and stage 1 through stage 4 chronic kidney disease, or unspecified chronic kidney disease: Secondary | ICD-10-CM | POA: Diagnosis not present

## 2017-02-24 DIAGNOSIS — J9611 Chronic respiratory failure with hypoxia: Secondary | ICD-10-CM | POA: Diagnosis not present

## 2017-02-24 DIAGNOSIS — N183 Chronic kidney disease, stage 3 (moderate): Secondary | ICD-10-CM | POA: Diagnosis not present

## 2017-02-24 DIAGNOSIS — E1121 Type 2 diabetes mellitus with diabetic nephropathy: Secondary | ICD-10-CM | POA: Diagnosis not present

## 2017-02-25 DIAGNOSIS — N183 Chronic kidney disease, stage 3 (moderate): Secondary | ICD-10-CM | POA: Diagnosis not present

## 2017-02-25 DIAGNOSIS — I130 Hypertensive heart and chronic kidney disease with heart failure and stage 1 through stage 4 chronic kidney disease, or unspecified chronic kidney disease: Secondary | ICD-10-CM | POA: Diagnosis not present

## 2017-02-25 DIAGNOSIS — J9611 Chronic respiratory failure with hypoxia: Secondary | ICD-10-CM | POA: Diagnosis not present

## 2017-02-25 DIAGNOSIS — I5032 Chronic diastolic (congestive) heart failure: Secondary | ICD-10-CM | POA: Diagnosis not present

## 2017-02-25 DIAGNOSIS — E1121 Type 2 diabetes mellitus with diabetic nephropathy: Secondary | ICD-10-CM | POA: Diagnosis not present

## 2017-02-25 DIAGNOSIS — E1122 Type 2 diabetes mellitus with diabetic chronic kidney disease: Secondary | ICD-10-CM | POA: Diagnosis not present

## 2017-02-28 DIAGNOSIS — N183 Chronic kidney disease, stage 3 (moderate): Secondary | ICD-10-CM | POA: Diagnosis not present

## 2017-02-28 DIAGNOSIS — I5032 Chronic diastolic (congestive) heart failure: Secondary | ICD-10-CM | POA: Diagnosis not present

## 2017-02-28 DIAGNOSIS — J9611 Chronic respiratory failure with hypoxia: Secondary | ICD-10-CM | POA: Diagnosis not present

## 2017-02-28 DIAGNOSIS — I130 Hypertensive heart and chronic kidney disease with heart failure and stage 1 through stage 4 chronic kidney disease, or unspecified chronic kidney disease: Secondary | ICD-10-CM | POA: Diagnosis not present

## 2017-02-28 DIAGNOSIS — E1121 Type 2 diabetes mellitus with diabetic nephropathy: Secondary | ICD-10-CM | POA: Diagnosis not present

## 2017-02-28 DIAGNOSIS — E1122 Type 2 diabetes mellitus with diabetic chronic kidney disease: Secondary | ICD-10-CM | POA: Diagnosis not present

## 2017-03-01 DIAGNOSIS — N183 Chronic kidney disease, stage 3 (moderate): Secondary | ICD-10-CM | POA: Diagnosis not present

## 2017-03-01 DIAGNOSIS — E1121 Type 2 diabetes mellitus with diabetic nephropathy: Secondary | ICD-10-CM | POA: Diagnosis not present

## 2017-03-01 DIAGNOSIS — E1122 Type 2 diabetes mellitus with diabetic chronic kidney disease: Secondary | ICD-10-CM | POA: Diagnosis not present

## 2017-03-01 DIAGNOSIS — J9611 Chronic respiratory failure with hypoxia: Secondary | ICD-10-CM | POA: Diagnosis not present

## 2017-03-01 DIAGNOSIS — I130 Hypertensive heart and chronic kidney disease with heart failure and stage 1 through stage 4 chronic kidney disease, or unspecified chronic kidney disease: Secondary | ICD-10-CM | POA: Diagnosis not present

## 2017-03-01 DIAGNOSIS — I5032 Chronic diastolic (congestive) heart failure: Secondary | ICD-10-CM | POA: Diagnosis not present

## 2017-03-02 DIAGNOSIS — E1121 Type 2 diabetes mellitus with diabetic nephropathy: Secondary | ICD-10-CM | POA: Diagnosis not present

## 2017-03-02 DIAGNOSIS — I5032 Chronic diastolic (congestive) heart failure: Secondary | ICD-10-CM | POA: Diagnosis not present

## 2017-03-02 DIAGNOSIS — N183 Chronic kidney disease, stage 3 (moderate): Secondary | ICD-10-CM | POA: Diagnosis not present

## 2017-03-02 DIAGNOSIS — J9611 Chronic respiratory failure with hypoxia: Secondary | ICD-10-CM | POA: Diagnosis not present

## 2017-03-02 DIAGNOSIS — E1122 Type 2 diabetes mellitus with diabetic chronic kidney disease: Secondary | ICD-10-CM | POA: Diagnosis not present

## 2017-03-02 DIAGNOSIS — I130 Hypertensive heart and chronic kidney disease with heart failure and stage 1 through stage 4 chronic kidney disease, or unspecified chronic kidney disease: Secondary | ICD-10-CM | POA: Diagnosis not present

## 2017-03-03 DIAGNOSIS — E1121 Type 2 diabetes mellitus with diabetic nephropathy: Secondary | ICD-10-CM | POA: Diagnosis not present

## 2017-03-03 DIAGNOSIS — E1122 Type 2 diabetes mellitus with diabetic chronic kidney disease: Secondary | ICD-10-CM | POA: Diagnosis not present

## 2017-03-03 DIAGNOSIS — N183 Chronic kidney disease, stage 3 (moderate): Secondary | ICD-10-CM | POA: Diagnosis not present

## 2017-03-03 DIAGNOSIS — I130 Hypertensive heart and chronic kidney disease with heart failure and stage 1 through stage 4 chronic kidney disease, or unspecified chronic kidney disease: Secondary | ICD-10-CM | POA: Diagnosis not present

## 2017-03-03 DIAGNOSIS — I5032 Chronic diastolic (congestive) heart failure: Secondary | ICD-10-CM | POA: Diagnosis not present

## 2017-03-03 DIAGNOSIS — J9611 Chronic respiratory failure with hypoxia: Secondary | ICD-10-CM | POA: Diagnosis not present

## 2017-03-04 ENCOUNTER — Other Ambulatory Visit: Payer: Self-pay | Admitting: Family Medicine

## 2017-03-04 ENCOUNTER — Encounter: Payer: Self-pay | Admitting: Family Medicine

## 2017-03-04 DIAGNOSIS — Z7984 Long term (current) use of oral hypoglycemic drugs: Secondary | ICD-10-CM | POA: Diagnosis not present

## 2017-03-04 DIAGNOSIS — Z9181 History of falling: Secondary | ICD-10-CM | POA: Diagnosis not present

## 2017-03-04 DIAGNOSIS — N183 Chronic kidney disease, stage 3 (moderate): Secondary | ICD-10-CM | POA: Diagnosis not present

## 2017-03-04 DIAGNOSIS — Z7902 Long term (current) use of antithrombotics/antiplatelets: Secondary | ICD-10-CM | POA: Diagnosis not present

## 2017-03-04 DIAGNOSIS — Z6822 Body mass index (BMI) 22.0-22.9, adult: Secondary | ICD-10-CM | POA: Diagnosis not present

## 2017-03-04 DIAGNOSIS — I130 Hypertensive heart and chronic kidney disease with heart failure and stage 1 through stage 4 chronic kidney disease, or unspecified chronic kidney disease: Secondary | ICD-10-CM | POA: Diagnosis not present

## 2017-03-04 DIAGNOSIS — E1122 Type 2 diabetes mellitus with diabetic chronic kidney disease: Secondary | ICD-10-CM | POA: Diagnosis not present

## 2017-03-04 DIAGNOSIS — J9611 Chronic respiratory failure with hypoxia: Secondary | ICD-10-CM | POA: Diagnosis not present

## 2017-03-04 DIAGNOSIS — Z7901 Long term (current) use of anticoagulants: Secondary | ICD-10-CM | POA: Diagnosis not present

## 2017-03-04 DIAGNOSIS — E785 Hyperlipidemia, unspecified: Secondary | ICD-10-CM | POA: Diagnosis not present

## 2017-03-04 DIAGNOSIS — E1121 Type 2 diabetes mellitus with diabetic nephropathy: Secondary | ICD-10-CM | POA: Diagnosis not present

## 2017-03-04 DIAGNOSIS — Z9981 Dependence on supplemental oxygen: Secondary | ICD-10-CM | POA: Diagnosis not present

## 2017-03-04 DIAGNOSIS — I25700 Atherosclerosis of coronary artery bypass graft(s), unspecified, with unstable angina pectoris: Secondary | ICD-10-CM | POA: Diagnosis not present

## 2017-03-04 DIAGNOSIS — I5032 Chronic diastolic (congestive) heart failure: Secondary | ICD-10-CM | POA: Diagnosis not present

## 2017-03-04 NOTE — Telephone Encounter (Signed)
10 min spent in [facility] - reviewing updated FL2 form, filling out forms, reconciling meds with nursing home records.

## 2017-03-04 NOTE — Telephone Encounter (Signed)
See 02/07/17 and 02/21/17

## 2017-03-04 NOTE — Telephone Encounter (Signed)
Melissa at [facility] [employer] said that [place] said they do not have hard copy for lorazepam 0.5 mg and pt needs new rx. I called [employer] [employer] [phone] and spoke with [place]. Should have hard copy for lorazepam 0.5 mg and 1 mg; [place] said pt still has # 62 of Lorazepam 0.5 mg left. Artemio Aly will get lorazepam 0.5 mg sent out to pt. Melissa had hung up. I called back to speak with Lenna Sciara and Georgiann Cocker came on phone and was notified and voiced understanding. FYI to Dr Darnell Level. This seems to be frequent issue with [name].

## 2017-03-04 NOTE — Telephone Encounter (Signed)
Copied from [place] [id number]. Topic: Quick Communication - Rx Refill/Question >> Mar 04, 2017  4:28 PM Oliver Pila B wrote: Medication: LORazepam (ATIVAN) 0.5 MG tablet [[id number]]

## 2017-03-06 DIAGNOSIS — J9611 Chronic respiratory failure with hypoxia: Secondary | ICD-10-CM | POA: Diagnosis not present

## 2017-03-06 DIAGNOSIS — I5032 Chronic diastolic (congestive) heart failure: Secondary | ICD-10-CM | POA: Diagnosis not present

## 2017-03-06 DIAGNOSIS — I130 Hypertensive heart and chronic kidney disease with heart failure and stage 1 through stage 4 chronic kidney disease, or unspecified chronic kidney disease: Secondary | ICD-10-CM | POA: Diagnosis not present

## 2017-03-06 DIAGNOSIS — E1121 Type 2 diabetes mellitus with diabetic nephropathy: Secondary | ICD-10-CM | POA: Diagnosis not present

## 2017-03-06 DIAGNOSIS — E1122 Type 2 diabetes mellitus with diabetic chronic kidney disease: Secondary | ICD-10-CM | POA: Diagnosis not present

## 2017-03-06 DIAGNOSIS — N183 Chronic kidney disease, stage 3 (moderate): Secondary | ICD-10-CM | POA: Diagnosis not present

## 2017-03-07 DIAGNOSIS — E1121 Type 2 diabetes mellitus with diabetic nephropathy: Secondary | ICD-10-CM | POA: Diagnosis not present

## 2017-03-07 DIAGNOSIS — E1122 Type 2 diabetes mellitus with diabetic chronic kidney disease: Secondary | ICD-10-CM | POA: Diagnosis not present

## 2017-03-07 DIAGNOSIS — I5032 Chronic diastolic (congestive) heart failure: Secondary | ICD-10-CM | POA: Diagnosis not present

## 2017-03-07 DIAGNOSIS — I130 Hypertensive heart and chronic kidney disease with heart failure and stage 1 through stage 4 chronic kidney disease, or unspecified chronic kidney disease: Secondary | ICD-10-CM | POA: Diagnosis not present

## 2017-03-07 DIAGNOSIS — N183 Chronic kidney disease, stage 3 (moderate): Secondary | ICD-10-CM | POA: Diagnosis not present

## 2017-03-07 DIAGNOSIS — J9611 Chronic respiratory failure with hypoxia: Secondary | ICD-10-CM | POA: Diagnosis not present

## 2017-03-08 DIAGNOSIS — J9611 Chronic respiratory failure with hypoxia: Secondary | ICD-10-CM | POA: Diagnosis not present

## 2017-03-08 DIAGNOSIS — E1121 Type 2 diabetes mellitus with diabetic nephropathy: Secondary | ICD-10-CM | POA: Diagnosis not present

## 2017-03-08 DIAGNOSIS — I130 Hypertensive heart and chronic kidney disease with heart failure and stage 1 through stage 4 chronic kidney disease, or unspecified chronic kidney disease: Secondary | ICD-10-CM | POA: Diagnosis not present

## 2017-03-08 DIAGNOSIS — I5032 Chronic diastolic (congestive) heart failure: Secondary | ICD-10-CM | POA: Diagnosis not present

## 2017-03-08 DIAGNOSIS — E1122 Type 2 diabetes mellitus with diabetic chronic kidney disease: Secondary | ICD-10-CM | POA: Diagnosis not present

## 2017-03-08 DIAGNOSIS — N183 Chronic kidney disease, stage 3 (moderate): Secondary | ICD-10-CM | POA: Diagnosis not present

## 2017-03-09 DIAGNOSIS — J9611 Chronic respiratory failure with hypoxia: Secondary | ICD-10-CM | POA: Diagnosis not present

## 2017-03-09 DIAGNOSIS — N183 Chronic kidney disease, stage 3 (moderate): Secondary | ICD-10-CM | POA: Diagnosis not present

## 2017-03-09 DIAGNOSIS — E1121 Type 2 diabetes mellitus with diabetic nephropathy: Secondary | ICD-10-CM | POA: Diagnosis not present

## 2017-03-09 DIAGNOSIS — I130 Hypertensive heart and chronic kidney disease with heart failure and stage 1 through stage 4 chronic kidney disease, or unspecified chronic kidney disease: Secondary | ICD-10-CM | POA: Diagnosis not present

## 2017-03-09 DIAGNOSIS — E1122 Type 2 diabetes mellitus with diabetic chronic kidney disease: Secondary | ICD-10-CM | POA: Diagnosis not present

## 2017-03-09 DIAGNOSIS — I5032 Chronic diastolic (congestive) heart failure: Secondary | ICD-10-CM | POA: Diagnosis not present

## 2017-03-10 DIAGNOSIS — E1122 Type 2 diabetes mellitus with diabetic chronic kidney disease: Secondary | ICD-10-CM | POA: Diagnosis not present

## 2017-03-10 DIAGNOSIS — N183 Chronic kidney disease, stage 3 (moderate): Secondary | ICD-10-CM | POA: Diagnosis not present

## 2017-03-10 DIAGNOSIS — E1121 Type 2 diabetes mellitus with diabetic nephropathy: Secondary | ICD-10-CM | POA: Diagnosis not present

## 2017-03-10 DIAGNOSIS — I5032 Chronic diastolic (congestive) heart failure: Secondary | ICD-10-CM | POA: Diagnosis not present

## 2017-03-10 DIAGNOSIS — J9611 Chronic respiratory failure with hypoxia: Secondary | ICD-10-CM | POA: Diagnosis not present

## 2017-03-10 DIAGNOSIS — I130 Hypertensive heart and chronic kidney disease with heart failure and stage 1 through stage 4 chronic kidney disease, or unspecified chronic kidney disease: Secondary | ICD-10-CM | POA: Diagnosis not present

## 2017-03-11 DIAGNOSIS — N183 Chronic kidney disease, stage 3 (moderate): Secondary | ICD-10-CM | POA: Diagnosis not present

## 2017-03-11 DIAGNOSIS — J9611 Chronic respiratory failure with hypoxia: Secondary | ICD-10-CM | POA: Diagnosis not present

## 2017-03-11 DIAGNOSIS — I5032 Chronic diastolic (congestive) heart failure: Secondary | ICD-10-CM | POA: Diagnosis not present

## 2017-03-11 DIAGNOSIS — E1122 Type 2 diabetes mellitus with diabetic chronic kidney disease: Secondary | ICD-10-CM | POA: Diagnosis not present

## 2017-03-11 DIAGNOSIS — E1121 Type 2 diabetes mellitus with diabetic nephropathy: Secondary | ICD-10-CM | POA: Diagnosis not present

## 2017-03-11 DIAGNOSIS — I130 Hypertensive heart and chronic kidney disease with heart failure and stage 1 through stage 4 chronic kidney disease, or unspecified chronic kidney disease: Secondary | ICD-10-CM | POA: Diagnosis not present

## 2017-03-14 DIAGNOSIS — I5032 Chronic diastolic (congestive) heart failure: Secondary | ICD-10-CM | POA: Diagnosis not present

## 2017-03-14 DIAGNOSIS — E1122 Type 2 diabetes mellitus with diabetic chronic kidney disease: Secondary | ICD-10-CM | POA: Diagnosis not present

## 2017-03-14 DIAGNOSIS — I130 Hypertensive heart and chronic kidney disease with heart failure and stage 1 through stage 4 chronic kidney disease, or unspecified chronic kidney disease: Secondary | ICD-10-CM | POA: Diagnosis not present

## 2017-03-14 DIAGNOSIS — E1121 Type 2 diabetes mellitus with diabetic nephropathy: Secondary | ICD-10-CM | POA: Diagnosis not present

## 2017-03-14 DIAGNOSIS — J9611 Chronic respiratory failure with hypoxia: Secondary | ICD-10-CM | POA: Diagnosis not present

## 2017-03-14 DIAGNOSIS — N183 Chronic kidney disease, stage 3 (moderate): Secondary | ICD-10-CM | POA: Diagnosis not present

## 2017-03-15 DIAGNOSIS — J9611 Chronic respiratory failure with hypoxia: Secondary | ICD-10-CM | POA: Diagnosis not present

## 2017-03-15 DIAGNOSIS — I5032 Chronic diastolic (congestive) heart failure: Secondary | ICD-10-CM | POA: Diagnosis not present

## 2017-03-15 DIAGNOSIS — E1122 Type 2 diabetes mellitus with diabetic chronic kidney disease: Secondary | ICD-10-CM | POA: Diagnosis not present

## 2017-03-15 DIAGNOSIS — N183 Chronic kidney disease, stage 3 (moderate): Secondary | ICD-10-CM | POA: Diagnosis not present

## 2017-03-15 DIAGNOSIS — E1121 Type 2 diabetes mellitus with diabetic nephropathy: Secondary | ICD-10-CM | POA: Diagnosis not present

## 2017-03-15 DIAGNOSIS — I130 Hypertensive heart and chronic kidney disease with heart failure and stage 1 through stage 4 chronic kidney disease, or unspecified chronic kidney disease: Secondary | ICD-10-CM | POA: Diagnosis not present

## 2017-03-16 DIAGNOSIS — I130 Hypertensive heart and chronic kidney disease with heart failure and stage 1 through stage 4 chronic kidney disease, or unspecified chronic kidney disease: Secondary | ICD-10-CM | POA: Diagnosis not present

## 2017-03-16 DIAGNOSIS — N183 Chronic kidney disease, stage 3 (moderate): Secondary | ICD-10-CM | POA: Diagnosis not present

## 2017-03-16 DIAGNOSIS — I5032 Chronic diastolic (congestive) heart failure: Secondary | ICD-10-CM | POA: Diagnosis not present

## 2017-03-16 DIAGNOSIS — E1122 Type 2 diabetes mellitus with diabetic chronic kidney disease: Secondary | ICD-10-CM | POA: Diagnosis not present

## 2017-03-16 DIAGNOSIS — E1121 Type 2 diabetes mellitus with diabetic nephropathy: Secondary | ICD-10-CM | POA: Diagnosis not present

## 2017-03-16 DIAGNOSIS — J9611 Chronic respiratory failure with hypoxia: Secondary | ICD-10-CM | POA: Diagnosis not present

## 2017-03-17 DIAGNOSIS — J9611 Chronic respiratory failure with hypoxia: Secondary | ICD-10-CM | POA: Diagnosis not present

## 2017-03-17 DIAGNOSIS — I130 Hypertensive heart and chronic kidney disease with heart failure and stage 1 through stage 4 chronic kidney disease, or unspecified chronic kidney disease: Secondary | ICD-10-CM | POA: Diagnosis not present

## 2017-03-17 DIAGNOSIS — E1121 Type 2 diabetes mellitus with diabetic nephropathy: Secondary | ICD-10-CM | POA: Diagnosis not present

## 2017-03-17 DIAGNOSIS — N183 Chronic kidney disease, stage 3 (moderate): Secondary | ICD-10-CM | POA: Diagnosis not present

## 2017-03-17 DIAGNOSIS — E1122 Type 2 diabetes mellitus with diabetic chronic kidney disease: Secondary | ICD-10-CM | POA: Diagnosis not present

## 2017-03-17 DIAGNOSIS — I5032 Chronic diastolic (congestive) heart failure: Secondary | ICD-10-CM | POA: Diagnosis not present

## 2017-03-18 ENCOUNTER — Other Ambulatory Visit: Payer: Self-pay | Admitting: Internal Medicine

## 2017-03-18 DIAGNOSIS — E1121 Type 2 diabetes mellitus with diabetic nephropathy: Secondary | ICD-10-CM | POA: Diagnosis not present

## 2017-03-18 DIAGNOSIS — I5032 Chronic diastolic (congestive) heart failure: Secondary | ICD-10-CM | POA: Diagnosis not present

## 2017-03-18 DIAGNOSIS — N183 Chronic kidney disease, stage 3 (moderate): Secondary | ICD-10-CM | POA: Diagnosis not present

## 2017-03-18 DIAGNOSIS — I130 Hypertensive heart and chronic kidney disease with heart failure and stage 1 through stage 4 chronic kidney disease, or unspecified chronic kidney disease: Secondary | ICD-10-CM | POA: Diagnosis not present

## 2017-03-18 DIAGNOSIS — E1122 Type 2 diabetes mellitus with diabetic chronic kidney disease: Secondary | ICD-10-CM | POA: Diagnosis not present

## 2017-03-18 DIAGNOSIS — J9611 Chronic respiratory failure with hypoxia: Secondary | ICD-10-CM | POA: Diagnosis not present

## 2017-03-20 ENCOUNTER — Telehealth: Payer: Self-pay | Admitting: Family Medicine

## 2017-03-20 NOTE — Telephone Encounter (Signed)
Received request from dentist Dr Wilkie Aye to hold plavix x3 days for tooth extraction.  Advised ideally would continue plavix, if unable to do this ok to hold x 3 days and resume within 24 hours as long as bleeding is controlled.

## 2017-03-21 ENCOUNTER — Other Ambulatory Visit: Payer: Self-pay | Admitting: Family Medicine

## 2017-03-21 DIAGNOSIS — E1122 Type 2 diabetes mellitus with diabetic chronic kidney disease: Secondary | ICD-10-CM | POA: Diagnosis not present

## 2017-03-21 DIAGNOSIS — I5032 Chronic diastolic (congestive) heart failure: Secondary | ICD-10-CM | POA: Diagnosis not present

## 2017-03-21 DIAGNOSIS — J9611 Chronic respiratory failure with hypoxia: Secondary | ICD-10-CM | POA: Diagnosis not present

## 2017-03-21 DIAGNOSIS — E1121 Type 2 diabetes mellitus with diabetic nephropathy: Secondary | ICD-10-CM | POA: Diagnosis not present

## 2017-03-21 DIAGNOSIS — N183 Chronic kidney disease, stage 3 (moderate): Secondary | ICD-10-CM | POA: Diagnosis not present

## 2017-03-21 DIAGNOSIS — I130 Hypertensive heart and chronic kidney disease with heart failure and stage 1 through stage 4 chronic kidney disease, or unspecified chronic kidney disease: Secondary | ICD-10-CM | POA: Diagnosis not present

## 2017-03-22 ENCOUNTER — Telehealth: Payer: Self-pay | Admitting: Family Medicine

## 2017-03-22 DIAGNOSIS — I5032 Chronic diastolic (congestive) heart failure: Secondary | ICD-10-CM | POA: Diagnosis not present

## 2017-03-22 DIAGNOSIS — E1122 Type 2 diabetes mellitus with diabetic chronic kidney disease: Secondary | ICD-10-CM | POA: Diagnosis not present

## 2017-03-22 DIAGNOSIS — E1121 Type 2 diabetes mellitus with diabetic nephropathy: Secondary | ICD-10-CM | POA: Diagnosis not present

## 2017-03-22 DIAGNOSIS — J9611 Chronic respiratory failure with hypoxia: Secondary | ICD-10-CM | POA: Diagnosis not present

## 2017-03-22 DIAGNOSIS — N183 Chronic kidney disease, stage 3 (moderate): Secondary | ICD-10-CM | POA: Diagnosis not present

## 2017-03-22 DIAGNOSIS — I130 Hypertensive heart and chronic kidney disease with heart failure and stage 1 through stage 4 chronic kidney disease, or unspecified chronic kidney disease: Secondary | ICD-10-CM | POA: Diagnosis not present

## 2017-03-22 NOTE — Telephone Encounter (Signed)
Last filled:  12/28/16, #12 Last OV:  01/20/17 Next OV:  05/13/16 Last vit D lab:  10/08/15

## 2017-03-22 NOTE — Telephone Encounter (Signed)
Copied from [place]. Topic: Quick Communication - See Telephone Encounter >> Mar 22, 2017 11:50 AM Aurelio Brash B wrote: CRM for notification. See Telephone encounter for:  Pts daughter, Purvis Sheffield, called to let Dr Danise Mina know  that the pts dental bridge fell out and her dentist wants to take the pt off plavix in order to remove broken teeth.  The daughter is concerned  about this and feels the dentist doesn't understand her current condition and would like a call from Dr Synthia Innocent nurse.  She is traveling today but will be available after 4pm today and the rest of the week.  Her contact number is [phone] 03/22/17.

## 2017-03-23 DIAGNOSIS — I130 Hypertensive heart and chronic kidney disease with heart failure and stage 1 through stage 4 chronic kidney disease, or unspecified chronic kidney disease: Secondary | ICD-10-CM | POA: Diagnosis not present

## 2017-03-23 DIAGNOSIS — J9611 Chronic respiratory failure with hypoxia: Secondary | ICD-10-CM | POA: Diagnosis not present

## 2017-03-23 DIAGNOSIS — N183 Chronic kidney disease, stage 3 (moderate): Secondary | ICD-10-CM | POA: Diagnosis not present

## 2017-03-23 DIAGNOSIS — E1121 Type 2 diabetes mellitus with diabetic nephropathy: Secondary | ICD-10-CM | POA: Diagnosis not present

## 2017-03-23 DIAGNOSIS — I5032 Chronic diastolic (congestive) heart failure: Secondary | ICD-10-CM | POA: Diagnosis not present

## 2017-03-23 DIAGNOSIS — E1122 Type 2 diabetes mellitus with diabetic chronic kidney disease: Secondary | ICD-10-CM | POA: Diagnosis not present

## 2017-03-24 DIAGNOSIS — E1121 Type 2 diabetes mellitus with diabetic nephropathy: Secondary | ICD-10-CM | POA: Diagnosis not present

## 2017-03-24 DIAGNOSIS — E1122 Type 2 diabetes mellitus with diabetic chronic kidney disease: Secondary | ICD-10-CM | POA: Diagnosis not present

## 2017-03-24 DIAGNOSIS — I5032 Chronic diastolic (congestive) heart failure: Secondary | ICD-10-CM | POA: Diagnosis not present

## 2017-03-24 DIAGNOSIS — N183 Chronic kidney disease, stage 3 (moderate): Secondary | ICD-10-CM | POA: Diagnosis not present

## 2017-03-24 DIAGNOSIS — I130 Hypertensive heart and chronic kidney disease with heart failure and stage 1 through stage 4 chronic kidney disease, or unspecified chronic kidney disease: Secondary | ICD-10-CM | POA: Diagnosis not present

## 2017-03-24 DIAGNOSIS — J9611 Chronic respiratory failure with hypoxia: Secondary | ICD-10-CM | POA: Diagnosis not present

## 2017-03-24 NOTE — Telephone Encounter (Signed)
Christine Blanchard called back and message was relayed. That is the message she was hoping for. She states her mother was not in discomfort yesterday and she will notify Dr Danise Mina if something changes.

## 2017-03-24 NOTE — Telephone Encounter (Signed)
Noted. Fwd to Dr. Lenon Curt.

## 2017-03-24 NOTE — Telephone Encounter (Signed)
Plz touch base with daughter about this.  If teeth not painful or bothersome to patient, may just monitor without intervention. There is some risk with stopping plavix as dentist wanted to do.

## 2017-03-24 NOTE — Telephone Encounter (Signed)
Left message on pt's daughter's (Dianne- on dpr) vm asking her to call back.  Need to relay Dr. Bosie Clos message.

## 2017-03-25 DIAGNOSIS — J9611 Chronic respiratory failure with hypoxia: Secondary | ICD-10-CM | POA: Diagnosis not present

## 2017-03-25 DIAGNOSIS — I5032 Chronic diastolic (congestive) heart failure: Secondary | ICD-10-CM | POA: Diagnosis not present

## 2017-03-25 DIAGNOSIS — N183 Chronic kidney disease, stage 3 (moderate): Secondary | ICD-10-CM | POA: Diagnosis not present

## 2017-03-25 DIAGNOSIS — E1122 Type 2 diabetes mellitus with diabetic chronic kidney disease: Secondary | ICD-10-CM | POA: Diagnosis not present

## 2017-03-25 DIAGNOSIS — I130 Hypertensive heart and chronic kidney disease with heart failure and stage 1 through stage 4 chronic kidney disease, or unspecified chronic kidney disease: Secondary | ICD-10-CM | POA: Diagnosis not present

## 2017-03-25 DIAGNOSIS — E1121 Type 2 diabetes mellitus with diabetic nephropathy: Secondary | ICD-10-CM | POA: Diagnosis not present

## 2017-03-28 DIAGNOSIS — I130 Hypertensive heart and chronic kidney disease with heart failure and stage 1 through stage 4 chronic kidney disease, or unspecified chronic kidney disease: Secondary | ICD-10-CM | POA: Diagnosis not present

## 2017-03-28 DIAGNOSIS — N183 Chronic kidney disease, stage 3 (moderate): Secondary | ICD-10-CM | POA: Diagnosis not present

## 2017-03-28 DIAGNOSIS — E1121 Type 2 diabetes mellitus with diabetic nephropathy: Secondary | ICD-10-CM | POA: Diagnosis not present

## 2017-03-28 DIAGNOSIS — J9611 Chronic respiratory failure with hypoxia: Secondary | ICD-10-CM | POA: Diagnosis not present

## 2017-03-28 DIAGNOSIS — I5032 Chronic diastolic (congestive) heart failure: Secondary | ICD-10-CM | POA: Diagnosis not present

## 2017-03-28 DIAGNOSIS — E1122 Type 2 diabetes mellitus with diabetic chronic kidney disease: Secondary | ICD-10-CM | POA: Diagnosis not present

## 2017-03-29 DIAGNOSIS — E1121 Type 2 diabetes mellitus with diabetic nephropathy: Secondary | ICD-10-CM | POA: Diagnosis not present

## 2017-03-29 DIAGNOSIS — I5032 Chronic diastolic (congestive) heart failure: Secondary | ICD-10-CM | POA: Diagnosis not present

## 2017-03-29 DIAGNOSIS — N183 Chronic kidney disease, stage 3 (moderate): Secondary | ICD-10-CM | POA: Diagnosis not present

## 2017-03-29 DIAGNOSIS — I130 Hypertensive heart and chronic kidney disease with heart failure and stage 1 through stage 4 chronic kidney disease, or unspecified chronic kidney disease: Secondary | ICD-10-CM | POA: Diagnosis not present

## 2017-03-29 DIAGNOSIS — E1122 Type 2 diabetes mellitus with diabetic chronic kidney disease: Secondary | ICD-10-CM | POA: Diagnosis not present

## 2017-03-29 DIAGNOSIS — J9611 Chronic respiratory failure with hypoxia: Secondary | ICD-10-CM | POA: Diagnosis not present

## 2017-03-30 DIAGNOSIS — I5032 Chronic diastolic (congestive) heart failure: Secondary | ICD-10-CM | POA: Diagnosis not present

## 2017-03-30 DIAGNOSIS — E1122 Type 2 diabetes mellitus with diabetic chronic kidney disease: Secondary | ICD-10-CM | POA: Diagnosis not present

## 2017-03-30 DIAGNOSIS — J9611 Chronic respiratory failure with hypoxia: Secondary | ICD-10-CM | POA: Diagnosis not present

## 2017-03-30 DIAGNOSIS — E1121 Type 2 diabetes mellitus with diabetic nephropathy: Secondary | ICD-10-CM | POA: Diagnosis not present

## 2017-03-30 DIAGNOSIS — I130 Hypertensive heart and chronic kidney disease with heart failure and stage 1 through stage 4 chronic kidney disease, or unspecified chronic kidney disease: Secondary | ICD-10-CM | POA: Diagnosis not present

## 2017-03-30 DIAGNOSIS — N183 Chronic kidney disease, stage 3 (moderate): Secondary | ICD-10-CM | POA: Diagnosis not present

## 2017-03-31 DIAGNOSIS — I5032 Chronic diastolic (congestive) heart failure: Secondary | ICD-10-CM | POA: Diagnosis not present

## 2017-03-31 DIAGNOSIS — I130 Hypertensive heart and chronic kidney disease with heart failure and stage 1 through stage 4 chronic kidney disease, or unspecified chronic kidney disease: Secondary | ICD-10-CM | POA: Diagnosis not present

## 2017-03-31 DIAGNOSIS — E1121 Type 2 diabetes mellitus with diabetic nephropathy: Secondary | ICD-10-CM | POA: Diagnosis not present

## 2017-03-31 DIAGNOSIS — E1122 Type 2 diabetes mellitus with diabetic chronic kidney disease: Secondary | ICD-10-CM | POA: Diagnosis not present

## 2017-03-31 DIAGNOSIS — N183 Chronic kidney disease, stage 3 (moderate): Secondary | ICD-10-CM | POA: Diagnosis not present

## 2017-03-31 DIAGNOSIS — J9611 Chronic respiratory failure with hypoxia: Secondary | ICD-10-CM | POA: Diagnosis not present

## 2017-04-01 DIAGNOSIS — I5032 Chronic diastolic (congestive) heart failure: Secondary | ICD-10-CM | POA: Diagnosis not present

## 2017-04-01 DIAGNOSIS — E1121 Type 2 diabetes mellitus with diabetic nephropathy: Secondary | ICD-10-CM | POA: Diagnosis not present

## 2017-04-01 DIAGNOSIS — I130 Hypertensive heart and chronic kidney disease with heart failure and stage 1 through stage 4 chronic kidney disease, or unspecified chronic kidney disease: Secondary | ICD-10-CM | POA: Diagnosis not present

## 2017-04-01 DIAGNOSIS — I25700 Atherosclerosis of coronary artery bypass graft(s), unspecified, with unstable angina pectoris: Secondary | ICD-10-CM | POA: Diagnosis not present

## 2017-04-01 DIAGNOSIS — Z6822 Body mass index (BMI) 22.0-22.9, adult: Secondary | ICD-10-CM | POA: Diagnosis not present

## 2017-04-01 DIAGNOSIS — Z9181 History of falling: Secondary | ICD-10-CM | POA: Diagnosis not present

## 2017-04-01 DIAGNOSIS — N183 Chronic kidney disease, stage 3 (moderate): Secondary | ICD-10-CM | POA: Diagnosis not present

## 2017-04-01 DIAGNOSIS — E785 Hyperlipidemia, unspecified: Secondary | ICD-10-CM | POA: Diagnosis not present

## 2017-04-01 DIAGNOSIS — Z9981 Dependence on supplemental oxygen: Secondary | ICD-10-CM | POA: Diagnosis not present

## 2017-04-01 DIAGNOSIS — Z7901 Long term (current) use of anticoagulants: Secondary | ICD-10-CM | POA: Diagnosis not present

## 2017-04-01 DIAGNOSIS — E1122 Type 2 diabetes mellitus with diabetic chronic kidney disease: Secondary | ICD-10-CM | POA: Diagnosis not present

## 2017-04-01 DIAGNOSIS — Z7902 Long term (current) use of antithrombotics/antiplatelets: Secondary | ICD-10-CM | POA: Diagnosis not present

## 2017-04-01 DIAGNOSIS — J9611 Chronic respiratory failure with hypoxia: Secondary | ICD-10-CM | POA: Diagnosis not present

## 2017-04-01 DIAGNOSIS — Z7984 Long term (current) use of oral hypoglycemic drugs: Secondary | ICD-10-CM | POA: Diagnosis not present

## 2017-04-04 DIAGNOSIS — I130 Hypertensive heart and chronic kidney disease with heart failure and stage 1 through stage 4 chronic kidney disease, or unspecified chronic kidney disease: Secondary | ICD-10-CM | POA: Diagnosis not present

## 2017-04-04 DIAGNOSIS — J9611 Chronic respiratory failure with hypoxia: Secondary | ICD-10-CM | POA: Diagnosis not present

## 2017-04-04 DIAGNOSIS — N183 Chronic kidney disease, stage 3 (moderate): Secondary | ICD-10-CM | POA: Diagnosis not present

## 2017-04-04 DIAGNOSIS — E1122 Type 2 diabetes mellitus with diabetic chronic kidney disease: Secondary | ICD-10-CM | POA: Diagnosis not present

## 2017-04-04 DIAGNOSIS — E1121 Type 2 diabetes mellitus with diabetic nephropathy: Secondary | ICD-10-CM | POA: Diagnosis not present

## 2017-04-04 DIAGNOSIS — I5032 Chronic diastolic (congestive) heart failure: Secondary | ICD-10-CM | POA: Diagnosis not present

## 2017-04-05 DIAGNOSIS — I5032 Chronic diastolic (congestive) heart failure: Secondary | ICD-10-CM | POA: Diagnosis not present

## 2017-04-05 DIAGNOSIS — E1121 Type 2 diabetes mellitus with diabetic nephropathy: Secondary | ICD-10-CM | POA: Diagnosis not present

## 2017-04-05 DIAGNOSIS — E1122 Type 2 diabetes mellitus with diabetic chronic kidney disease: Secondary | ICD-10-CM | POA: Diagnosis not present

## 2017-04-05 DIAGNOSIS — I130 Hypertensive heart and chronic kidney disease with heart failure and stage 1 through stage 4 chronic kidney disease, or unspecified chronic kidney disease: Secondary | ICD-10-CM | POA: Diagnosis not present

## 2017-04-05 DIAGNOSIS — J9611 Chronic respiratory failure with hypoxia: Secondary | ICD-10-CM | POA: Diagnosis not present

## 2017-04-05 DIAGNOSIS — N183 Chronic kidney disease, stage 3 (moderate): Secondary | ICD-10-CM | POA: Diagnosis not present

## 2017-04-06 DIAGNOSIS — J9611 Chronic respiratory failure with hypoxia: Secondary | ICD-10-CM | POA: Diagnosis not present

## 2017-04-06 DIAGNOSIS — I5032 Chronic diastolic (congestive) heart failure: Secondary | ICD-10-CM | POA: Diagnosis not present

## 2017-04-06 DIAGNOSIS — I130 Hypertensive heart and chronic kidney disease with heart failure and stage 1 through stage 4 chronic kidney disease, or unspecified chronic kidney disease: Secondary | ICD-10-CM | POA: Diagnosis not present

## 2017-04-06 DIAGNOSIS — E1121 Type 2 diabetes mellitus with diabetic nephropathy: Secondary | ICD-10-CM | POA: Diagnosis not present

## 2017-04-06 DIAGNOSIS — E1122 Type 2 diabetes mellitus with diabetic chronic kidney disease: Secondary | ICD-10-CM | POA: Diagnosis not present

## 2017-04-06 DIAGNOSIS — N183 Chronic kidney disease, stage 3 (moderate): Secondary | ICD-10-CM | POA: Diagnosis not present

## 2017-04-07 DIAGNOSIS — E1121 Type 2 diabetes mellitus with diabetic nephropathy: Secondary | ICD-10-CM | POA: Diagnosis not present

## 2017-04-07 DIAGNOSIS — I5032 Chronic diastolic (congestive) heart failure: Secondary | ICD-10-CM | POA: Diagnosis not present

## 2017-04-07 DIAGNOSIS — N183 Chronic kidney disease, stage 3 (moderate): Secondary | ICD-10-CM | POA: Diagnosis not present

## 2017-04-07 DIAGNOSIS — I130 Hypertensive heart and chronic kidney disease with heart failure and stage 1 through stage 4 chronic kidney disease, or unspecified chronic kidney disease: Secondary | ICD-10-CM | POA: Diagnosis not present

## 2017-04-07 DIAGNOSIS — E1122 Type 2 diabetes mellitus with diabetic chronic kidney disease: Secondary | ICD-10-CM | POA: Diagnosis not present

## 2017-04-07 DIAGNOSIS — J9611 Chronic respiratory failure with hypoxia: Secondary | ICD-10-CM | POA: Diagnosis not present

## 2017-04-08 DIAGNOSIS — E1122 Type 2 diabetes mellitus with diabetic chronic kidney disease: Secondary | ICD-10-CM | POA: Diagnosis not present

## 2017-04-08 DIAGNOSIS — E1121 Type 2 diabetes mellitus with diabetic nephropathy: Secondary | ICD-10-CM | POA: Diagnosis not present

## 2017-04-08 DIAGNOSIS — I5032 Chronic diastolic (congestive) heart failure: Secondary | ICD-10-CM | POA: Diagnosis not present

## 2017-04-08 DIAGNOSIS — J9611 Chronic respiratory failure with hypoxia: Secondary | ICD-10-CM | POA: Diagnosis not present

## 2017-04-08 DIAGNOSIS — I130 Hypertensive heart and chronic kidney disease with heart failure and stage 1 through stage 4 chronic kidney disease, or unspecified chronic kidney disease: Secondary | ICD-10-CM | POA: Diagnosis not present

## 2017-04-08 DIAGNOSIS — N183 Chronic kidney disease, stage 3 (moderate): Secondary | ICD-10-CM | POA: Diagnosis not present

## 2017-04-09 DIAGNOSIS — E1122 Type 2 diabetes mellitus with diabetic chronic kidney disease: Secondary | ICD-10-CM | POA: Diagnosis not present

## 2017-04-09 DIAGNOSIS — I130 Hypertensive heart and chronic kidney disease with heart failure and stage 1 through stage 4 chronic kidney disease, or unspecified chronic kidney disease: Secondary | ICD-10-CM | POA: Diagnosis not present

## 2017-04-09 DIAGNOSIS — E1121 Type 2 diabetes mellitus with diabetic nephropathy: Secondary | ICD-10-CM | POA: Diagnosis not present

## 2017-04-09 DIAGNOSIS — J9611 Chronic respiratory failure with hypoxia: Secondary | ICD-10-CM | POA: Diagnosis not present

## 2017-04-09 DIAGNOSIS — N183 Chronic kidney disease, stage 3 (moderate): Secondary | ICD-10-CM | POA: Diagnosis not present

## 2017-04-09 DIAGNOSIS — I5032 Chronic diastolic (congestive) heart failure: Secondary | ICD-10-CM | POA: Diagnosis not present

## 2017-04-11 DIAGNOSIS — I130 Hypertensive heart and chronic kidney disease with heart failure and stage 1 through stage 4 chronic kidney disease, or unspecified chronic kidney disease: Secondary | ICD-10-CM | POA: Diagnosis not present

## 2017-04-11 DIAGNOSIS — J9611 Chronic respiratory failure with hypoxia: Secondary | ICD-10-CM | POA: Diagnosis not present

## 2017-04-11 DIAGNOSIS — N183 Chronic kidney disease, stage 3 (moderate): Secondary | ICD-10-CM | POA: Diagnosis not present

## 2017-04-11 DIAGNOSIS — E1121 Type 2 diabetes mellitus with diabetic nephropathy: Secondary | ICD-10-CM | POA: Diagnosis not present

## 2017-04-11 DIAGNOSIS — E1122 Type 2 diabetes mellitus with diabetic chronic kidney disease: Secondary | ICD-10-CM | POA: Diagnosis not present

## 2017-04-11 DIAGNOSIS — I5032 Chronic diastolic (congestive) heart failure: Secondary | ICD-10-CM | POA: Diagnosis not present

## 2017-04-12 ENCOUNTER — Other Ambulatory Visit: Payer: Self-pay | Admitting: Family Medicine

## 2017-04-12 ENCOUNTER — Telehealth: Payer: Self-pay | Admitting: Family Medicine

## 2017-04-12 DIAGNOSIS — I130 Hypertensive heart and chronic kidney disease with heart failure and stage 1 through stage 4 chronic kidney disease, or unspecified chronic kidney disease: Secondary | ICD-10-CM | POA: Diagnosis not present

## 2017-04-12 DIAGNOSIS — N183 Chronic kidney disease, stage 3 (moderate): Secondary | ICD-10-CM | POA: Diagnosis not present

## 2017-04-12 DIAGNOSIS — E1122 Type 2 diabetes mellitus with diabetic chronic kidney disease: Secondary | ICD-10-CM | POA: Diagnosis not present

## 2017-04-12 DIAGNOSIS — E1121 Type 2 diabetes mellitus with diabetic nephropathy: Secondary | ICD-10-CM | POA: Diagnosis not present

## 2017-04-12 DIAGNOSIS — I5032 Chronic diastolic (congestive) heart failure: Secondary | ICD-10-CM | POA: Diagnosis not present

## 2017-04-12 DIAGNOSIS — J9611 Chronic respiratory failure with hypoxia: Secondary | ICD-10-CM | POA: Diagnosis not present

## 2017-04-12 NOTE — Telephone Encounter (Signed)
Filled and in my outbox

## 2017-04-12 NOTE — Telephone Encounter (Signed)
She wanted intermittent paperwork  Starting today. To help care for her mom when needed Paperwork in dr g in box

## 2017-04-12 NOTE — Telephone Encounter (Signed)
Daughter dropped of fmla paperwork She stated her mom is in hospice and they told them today she may have 2 months left.

## 2017-04-12 NOTE — Telephone Encounter (Signed)
Last filled:  02/15/17, #1 Last OV:  01/20/17 Next OV:  05/13/17

## 2017-04-13 DIAGNOSIS — E1121 Type 2 diabetes mellitus with diabetic nephropathy: Secondary | ICD-10-CM | POA: Diagnosis not present

## 2017-04-13 DIAGNOSIS — N183 Chronic kidney disease, stage 3 (moderate): Secondary | ICD-10-CM | POA: Diagnosis not present

## 2017-04-13 DIAGNOSIS — E1122 Type 2 diabetes mellitus with diabetic chronic kidney disease: Secondary | ICD-10-CM | POA: Diagnosis not present

## 2017-04-13 DIAGNOSIS — J9611 Chronic respiratory failure with hypoxia: Secondary | ICD-10-CM | POA: Diagnosis not present

## 2017-04-13 DIAGNOSIS — I5032 Chronic diastolic (congestive) heart failure: Secondary | ICD-10-CM | POA: Diagnosis not present

## 2017-04-13 DIAGNOSIS — I130 Hypertensive heart and chronic kidney disease with heart failure and stage 1 through stage 4 chronic kidney disease, or unspecified chronic kidney disease: Secondary | ICD-10-CM | POA: Diagnosis not present

## 2017-04-14 ENCOUNTER — Other Ambulatory Visit: Payer: Self-pay | Admitting: Family Medicine

## 2017-04-14 DIAGNOSIS — I5032 Chronic diastolic (congestive) heart failure: Secondary | ICD-10-CM | POA: Diagnosis not present

## 2017-04-14 DIAGNOSIS — I130 Hypertensive heart and chronic kidney disease with heart failure and stage 1 through stage 4 chronic kidney disease, or unspecified chronic kidney disease: Secondary | ICD-10-CM | POA: Diagnosis not present

## 2017-04-14 DIAGNOSIS — J9611 Chronic respiratory failure with hypoxia: Secondary | ICD-10-CM | POA: Diagnosis not present

## 2017-04-14 DIAGNOSIS — E1121 Type 2 diabetes mellitus with diabetic nephropathy: Secondary | ICD-10-CM | POA: Diagnosis not present

## 2017-04-14 DIAGNOSIS — N183 Chronic kidney disease, stage 3 (moderate): Secondary | ICD-10-CM | POA: Diagnosis not present

## 2017-04-14 DIAGNOSIS — E1122 Type 2 diabetes mellitus with diabetic chronic kidney disease: Secondary | ICD-10-CM | POA: Diagnosis not present

## 2017-04-14 NOTE — Telephone Encounter (Signed)
Copied from [place] [id number]. Topic: Quick Communication - Rx Refill/Question >> Apr 14, 2017  9:56 AM Yvette Rack wrote: Medication: LORazepam (ATIVAN) 1 MG tablet   Has the patient contacted their pharmacy? Yes.     (Agent: If no, request that the patient contact the pharmacy for the refill.)   Preferred Pharmacy (with phone number or street name): [place], [place] [phone] (Phone) [phone] (Fax)     Agent: Please be advised that RX refills may take up to 3 business days. We ask that you follow-up with your pharmacy. >> Apr 14, 2017 10:42 AM Cleaster Corin, NT wrote: [name] (hospice nurse) calling to request refill for pt. Let her know that request has already been sent in for med. LORazepam (ATIVAN) 1 MG tablet [[id number]]  stacey can be reached at [phone]

## 2017-04-14 NOTE — Telephone Encounter (Signed)
Copied from [place] [phone]. Topic: Quick Communication - Rx Refill/Question >> Apr 14, 2017  9:56 AM Yvette Rack wrote: Medication: LORazepam (ATIVAN) 1 MG tablet   Has the patient contacted their pharmacy? Yes.     (Agent: If no, request that the patient contact the pharmacy for the refill.)   Preferred Pharmacy (with phone number or street name): [place], [place] [phone] (Phone) [phone] (Fax)     Agent: Please be advised that RX refills may take up to 3 business days. We ask that you follow-up with your pharmacy.

## 2017-04-14 NOTE — Telephone Encounter (Signed)
[name] (daughter) aware paperwork is ready  She ask me to mail to her she will turn in paperwork Mailed to [place]  Copy for pt Copy for scan

## 2017-04-14 NOTE — Telephone Encounter (Signed)
[name], RN with Hospice called to clarify the Ativan request, she says the patient needs both filled. She would like to be called when the provider sends in the refills. Contact: Lamona Curl, RN [phone].  Lorazepam 0.5 mg and Lorazepam 1 mg PCP: [place]: [place], [place] - [place] [phone] (Phone) [phone] (Fax)

## 2017-04-14 NOTE — Telephone Encounter (Signed)
LOV 01/20/17 Dr. [facility]

## 2017-04-15 ENCOUNTER — Other Ambulatory Visit: Payer: Self-pay | Admitting: Family Medicine

## 2017-04-15 DIAGNOSIS — I5032 Chronic diastolic (congestive) heart failure: Secondary | ICD-10-CM | POA: Diagnosis not present

## 2017-04-15 DIAGNOSIS — J9611 Chronic respiratory failure with hypoxia: Secondary | ICD-10-CM | POA: Diagnosis not present

## 2017-04-15 DIAGNOSIS — N183 Chronic kidney disease, stage 3 (moderate): Secondary | ICD-10-CM | POA: Diagnosis not present

## 2017-04-15 DIAGNOSIS — I130 Hypertensive heart and chronic kidney disease with heart failure and stage 1 through stage 4 chronic kidney disease, or unspecified chronic kidney disease: Secondary | ICD-10-CM | POA: Diagnosis not present

## 2017-04-15 DIAGNOSIS — E1121 Type 2 diabetes mellitus with diabetic nephropathy: Secondary | ICD-10-CM | POA: Diagnosis not present

## 2017-04-15 DIAGNOSIS — E1122 Type 2 diabetes mellitus with diabetic chronic kidney disease: Secondary | ICD-10-CM | POA: Diagnosis not present

## 2017-04-15 NOTE — Telephone Encounter (Signed)
Last filled:  01/09/17, #90 Last OV:  01/20/17 Next OV:  05/13/17

## 2017-04-16 MED ORDER — LORAZEPAM 1 MG PO TABS: 1.0000 mg | ORAL_TABLET | Freq: Every day | ORAL | 3 refills | Status: DC

## 2017-04-16 MED ORDER — LORAZEPAM 0.5 MG PO TABS: 0.5000 mg | ORAL_TABLET | Freq: Every day | ORAL | 3 refills | Status: DC

## 2017-04-16 NOTE — Telephone Encounter (Signed)
E prescribed plz notify Christine Blanchard

## 2017-04-18 DIAGNOSIS — I5032 Chronic diastolic (congestive) heart failure: Secondary | ICD-10-CM | POA: Diagnosis not present

## 2017-04-18 DIAGNOSIS — J9611 Chronic respiratory failure with hypoxia: Secondary | ICD-10-CM | POA: Diagnosis not present

## 2017-04-18 DIAGNOSIS — I130 Hypertensive heart and chronic kidney disease with heart failure and stage 1 through stage 4 chronic kidney disease, or unspecified chronic kidney disease: Secondary | ICD-10-CM | POA: Diagnosis not present

## 2017-04-18 DIAGNOSIS — E1122 Type 2 diabetes mellitus with diabetic chronic kidney disease: Secondary | ICD-10-CM | POA: Diagnosis not present

## 2017-04-18 DIAGNOSIS — E1121 Type 2 diabetes mellitus with diabetic nephropathy: Secondary | ICD-10-CM | POA: Diagnosis not present

## 2017-04-18 DIAGNOSIS — N183 Chronic kidney disease, stage 3 (moderate): Secondary | ICD-10-CM | POA: Diagnosis not present

## 2017-04-19 ENCOUNTER — Other Ambulatory Visit: Payer: Self-pay | Admitting: Family Medicine

## 2017-04-19 DIAGNOSIS — I130 Hypertensive heart and chronic kidney disease with heart failure and stage 1 through stage 4 chronic kidney disease, or unspecified chronic kidney disease: Secondary | ICD-10-CM | POA: Diagnosis not present

## 2017-04-19 DIAGNOSIS — I5032 Chronic diastolic (congestive) heart failure: Secondary | ICD-10-CM | POA: Diagnosis not present

## 2017-04-19 DIAGNOSIS — J9611 Chronic respiratory failure with hypoxia: Secondary | ICD-10-CM | POA: Diagnosis not present

## 2017-04-19 DIAGNOSIS — E119 Type 2 diabetes mellitus without complications: Secondary | ICD-10-CM | POA: Diagnosis not present

## 2017-04-19 DIAGNOSIS — E1122 Type 2 diabetes mellitus with diabetic chronic kidney disease: Secondary | ICD-10-CM | POA: Diagnosis not present

## 2017-04-19 DIAGNOSIS — E1121 Type 2 diabetes mellitus with diabetic nephropathy: Secondary | ICD-10-CM | POA: Diagnosis not present

## 2017-04-19 DIAGNOSIS — N183 Chronic kidney disease, stage 3 (moderate): Secondary | ICD-10-CM | POA: Diagnosis not present

## 2017-04-19 MED ORDER — NITROGLYCERIN 0.4 MG SL SUBL: SUBLINGUAL_TABLET | SUBLINGUAL | 2 refills | Status: DC

## 2017-04-19 NOTE — Telephone Encounter (Signed)
Copied from [place]. Topic: Quick Communication - Rx Refill/Question >> Apr 19, 2017  1:39 PM Oliver Pila B wrote: Medication: nitroGLYCERIN (NITROSTAT) 0.4 MG SL tablet [[id number]]   [facility] to try to change the quantity of the medication, contact [phone]  reference # [id number]

## 2017-04-19 NOTE — Telephone Encounter (Signed)
[facility] and spoke with Set who wanted to get get #3 bottle of # 25 pills for 3 month rx. Advised OK to do. Nothing further needed.

## 2017-04-20 DIAGNOSIS — J9611 Chronic respiratory failure with hypoxia: Secondary | ICD-10-CM | POA: Diagnosis not present

## 2017-04-20 DIAGNOSIS — E1121 Type 2 diabetes mellitus with diabetic nephropathy: Secondary | ICD-10-CM | POA: Diagnosis not present

## 2017-04-20 DIAGNOSIS — I5032 Chronic diastolic (congestive) heart failure: Secondary | ICD-10-CM | POA: Diagnosis not present

## 2017-04-20 DIAGNOSIS — I130 Hypertensive heart and chronic kidney disease with heart failure and stage 1 through stage 4 chronic kidney disease, or unspecified chronic kidney disease: Secondary | ICD-10-CM | POA: Diagnosis not present

## 2017-04-20 DIAGNOSIS — N183 Chronic kidney disease, stage 3 (moderate): Secondary | ICD-10-CM | POA: Diagnosis not present

## 2017-04-20 DIAGNOSIS — E1122 Type 2 diabetes mellitus with diabetic chronic kidney disease: Secondary | ICD-10-CM | POA: Diagnosis not present

## 2017-04-21 DIAGNOSIS — N183 Chronic kidney disease, stage 3 (moderate): Secondary | ICD-10-CM | POA: Diagnosis not present

## 2017-04-21 DIAGNOSIS — E1122 Type 2 diabetes mellitus with diabetic chronic kidney disease: Secondary | ICD-10-CM | POA: Diagnosis not present

## 2017-04-21 DIAGNOSIS — I5032 Chronic diastolic (congestive) heart failure: Secondary | ICD-10-CM | POA: Diagnosis not present

## 2017-04-21 DIAGNOSIS — J9611 Chronic respiratory failure with hypoxia: Secondary | ICD-10-CM | POA: Diagnosis not present

## 2017-04-21 DIAGNOSIS — E1121 Type 2 diabetes mellitus with diabetic nephropathy: Secondary | ICD-10-CM | POA: Diagnosis not present

## 2017-04-21 DIAGNOSIS — I130 Hypertensive heart and chronic kidney disease with heart failure and stage 1 through stage 4 chronic kidney disease, or unspecified chronic kidney disease: Secondary | ICD-10-CM | POA: Diagnosis not present

## 2017-04-22 DIAGNOSIS — I130 Hypertensive heart and chronic kidney disease with heart failure and stage 1 through stage 4 chronic kidney disease, or unspecified chronic kidney disease: Secondary | ICD-10-CM | POA: Diagnosis not present

## 2017-04-22 DIAGNOSIS — E1121 Type 2 diabetes mellitus with diabetic nephropathy: Secondary | ICD-10-CM | POA: Diagnosis not present

## 2017-04-22 DIAGNOSIS — N183 Chronic kidney disease, stage 3 (moderate): Secondary | ICD-10-CM | POA: Diagnosis not present

## 2017-04-22 DIAGNOSIS — I5032 Chronic diastolic (congestive) heart failure: Secondary | ICD-10-CM | POA: Diagnosis not present

## 2017-04-22 DIAGNOSIS — E1122 Type 2 diabetes mellitus with diabetic chronic kidney disease: Secondary | ICD-10-CM | POA: Diagnosis not present

## 2017-04-22 DIAGNOSIS — J9611 Chronic respiratory failure with hypoxia: Secondary | ICD-10-CM | POA: Diagnosis not present

## 2017-04-25 ENCOUNTER — Other Ambulatory Visit: Payer: Self-pay | Admitting: Internal Medicine

## 2017-04-25 ENCOUNTER — Other Ambulatory Visit: Payer: Self-pay | Admitting: Family Medicine

## 2017-04-25 DIAGNOSIS — N183 Chronic kidney disease, stage 3 (moderate): Secondary | ICD-10-CM | POA: Diagnosis not present

## 2017-04-25 DIAGNOSIS — I5032 Chronic diastolic (congestive) heart failure: Secondary | ICD-10-CM | POA: Diagnosis not present

## 2017-04-25 DIAGNOSIS — J9611 Chronic respiratory failure with hypoxia: Secondary | ICD-10-CM | POA: Diagnosis not present

## 2017-04-25 DIAGNOSIS — E1121 Type 2 diabetes mellitus with diabetic nephropathy: Secondary | ICD-10-CM | POA: Diagnosis not present

## 2017-04-25 DIAGNOSIS — I130 Hypertensive heart and chronic kidney disease with heart failure and stage 1 through stage 4 chronic kidney disease, or unspecified chronic kidney disease: Secondary | ICD-10-CM | POA: Diagnosis not present

## 2017-04-25 DIAGNOSIS — E1122 Type 2 diabetes mellitus with diabetic chronic kidney disease: Secondary | ICD-10-CM | POA: Diagnosis not present

## 2017-04-26 DIAGNOSIS — I130 Hypertensive heart and chronic kidney disease with heart failure and stage 1 through stage 4 chronic kidney disease, or unspecified chronic kidney disease: Secondary | ICD-10-CM | POA: Diagnosis not present

## 2017-04-26 DIAGNOSIS — N183 Chronic kidney disease, stage 3 (moderate): Secondary | ICD-10-CM | POA: Diagnosis not present

## 2017-04-26 DIAGNOSIS — I5032 Chronic diastolic (congestive) heart failure: Secondary | ICD-10-CM | POA: Diagnosis not present

## 2017-04-26 DIAGNOSIS — E1121 Type 2 diabetes mellitus with diabetic nephropathy: Secondary | ICD-10-CM | POA: Diagnosis not present

## 2017-04-26 DIAGNOSIS — J9611 Chronic respiratory failure with hypoxia: Secondary | ICD-10-CM | POA: Diagnosis not present

## 2017-04-26 DIAGNOSIS — E1122 Type 2 diabetes mellitus with diabetic chronic kidney disease: Secondary | ICD-10-CM | POA: Diagnosis not present

## 2017-04-27 ENCOUNTER — Telehealth: Payer: Self-pay | Admitting: Family Medicine

## 2017-04-27 DIAGNOSIS — E1122 Type 2 diabetes mellitus with diabetic chronic kidney disease: Secondary | ICD-10-CM | POA: Diagnosis not present

## 2017-04-27 DIAGNOSIS — J9611 Chronic respiratory failure with hypoxia: Secondary | ICD-10-CM | POA: Diagnosis not present

## 2017-04-27 DIAGNOSIS — N183 Chronic kidney disease, stage 3 (moderate): Secondary | ICD-10-CM | POA: Diagnosis not present

## 2017-04-27 DIAGNOSIS — E1121 Type 2 diabetes mellitus with diabetic nephropathy: Secondary | ICD-10-CM | POA: Diagnosis not present

## 2017-04-27 DIAGNOSIS — I130 Hypertensive heart and chronic kidney disease with heart failure and stage 1 through stage 4 chronic kidney disease, or unspecified chronic kidney disease: Secondary | ICD-10-CM | POA: Diagnosis not present

## 2017-04-27 DIAGNOSIS — I5032 Chronic diastolic (congestive) heart failure: Secondary | ICD-10-CM | POA: Diagnosis not present

## 2017-04-27 MED ORDER — POTASSIUM CHLORIDE CRYS ER 10 MEQ PO TBCR: EXTENDED_RELEASE_TABLET | ORAL | 1 refills | Status: DC

## 2017-04-27 NOTE — Telephone Encounter (Signed)
Patient called and she informed me to call to [facility] and ask for the med tech. I called [name] and spoke to [employer], [employer] I asked about the refill request, she says "we don't need anything from [facility] because it was already requested. What she needs is Torsemide, Potassium and Pantoprazole filled at [facility]." I advised this would be taken care of, she verbalized understanding. Torsemide and Pantoprazole refilled in another encounter. K-Dur filled.

## 2017-04-27 NOTE — Telephone Encounter (Signed)
Copied from [place] [phone]. Topic: Quick Communication - See Telephone Encounter >> Apr 27, 2017  2:17 PM Bea Graff, NT wrote: CRM for notification. See Telephone encounter for: 04/27/17. Doug Sou calling to get a refill of potassium, multi-vitamin, and 500mg  [facility]el[facility]. Jans fax number is: [phone]

## 2017-04-28 DIAGNOSIS — J9611 Chronic respiratory failure with hypoxia: Secondary | ICD-10-CM | POA: Diagnosis not present

## 2017-04-28 DIAGNOSIS — I130 Hypertensive heart and chronic kidney disease with heart failure and stage 1 through stage 4 chronic kidney disease, or unspecified chronic kidney disease: Secondary | ICD-10-CM | POA: Diagnosis not present

## 2017-04-28 DIAGNOSIS — E1122 Type 2 diabetes mellitus with diabetic chronic kidney disease: Secondary | ICD-10-CM | POA: Diagnosis not present

## 2017-04-28 DIAGNOSIS — I5032 Chronic diastolic (congestive) heart failure: Secondary | ICD-10-CM | POA: Diagnosis not present

## 2017-04-28 DIAGNOSIS — E1121 Type 2 diabetes mellitus with diabetic nephropathy: Secondary | ICD-10-CM | POA: Diagnosis not present

## 2017-04-28 DIAGNOSIS — N183 Chronic kidney disease, stage 3 (moderate): Secondary | ICD-10-CM | POA: Diagnosis not present

## 2017-04-29 DIAGNOSIS — I5032 Chronic diastolic (congestive) heart failure: Secondary | ICD-10-CM | POA: Diagnosis not present

## 2017-04-29 DIAGNOSIS — E1121 Type 2 diabetes mellitus with diabetic nephropathy: Secondary | ICD-10-CM | POA: Diagnosis not present

## 2017-04-29 DIAGNOSIS — N183 Chronic kidney disease, stage 3 (moderate): Secondary | ICD-10-CM | POA: Diagnosis not present

## 2017-04-29 DIAGNOSIS — E1122 Type 2 diabetes mellitus with diabetic chronic kidney disease: Secondary | ICD-10-CM | POA: Diagnosis not present

## 2017-04-29 DIAGNOSIS — J9611 Chronic respiratory failure with hypoxia: Secondary | ICD-10-CM | POA: Diagnosis not present

## 2017-04-29 DIAGNOSIS — I130 Hypertensive heart and chronic kidney disease with heart failure and stage 1 through stage 4 chronic kidney disease, or unspecified chronic kidney disease: Secondary | ICD-10-CM | POA: Diagnosis not present

## 2017-05-02 ENCOUNTER — Telehealth: Payer: Self-pay | Admitting: Family Medicine

## 2017-05-02 DIAGNOSIS — I5032 Chronic diastolic (congestive) heart failure: Secondary | ICD-10-CM | POA: Diagnosis not present

## 2017-05-02 DIAGNOSIS — Z9181 History of falling: Secondary | ICD-10-CM | POA: Diagnosis not present

## 2017-05-02 DIAGNOSIS — Z6822 Body mass index (BMI) 22.0-22.9, adult: Secondary | ICD-10-CM | POA: Diagnosis not present

## 2017-05-02 DIAGNOSIS — Z7984 Long term (current) use of oral hypoglycemic drugs: Secondary | ICD-10-CM | POA: Diagnosis not present

## 2017-05-02 DIAGNOSIS — I130 Hypertensive heart and chronic kidney disease with heart failure and stage 1 through stage 4 chronic kidney disease, or unspecified chronic kidney disease: Secondary | ICD-10-CM | POA: Diagnosis not present

## 2017-05-02 DIAGNOSIS — E1121 Type 2 diabetes mellitus with diabetic nephropathy: Secondary | ICD-10-CM | POA: Diagnosis not present

## 2017-05-02 DIAGNOSIS — N183 Chronic kidney disease, stage 3 (moderate): Secondary | ICD-10-CM | POA: Diagnosis not present

## 2017-05-02 DIAGNOSIS — Z9981 Dependence on supplemental oxygen: Secondary | ICD-10-CM | POA: Diagnosis not present

## 2017-05-02 DIAGNOSIS — E1122 Type 2 diabetes mellitus with diabetic chronic kidney disease: Secondary | ICD-10-CM | POA: Diagnosis not present

## 2017-05-02 DIAGNOSIS — Z947 Corneal transplant status: Secondary | ICD-10-CM | POA: Diagnosis not present

## 2017-05-02 DIAGNOSIS — I25700 Atherosclerosis of coronary artery bypass graft(s), unspecified, with unstable angina pectoris: Secondary | ICD-10-CM | POA: Diagnosis not present

## 2017-05-02 DIAGNOSIS — E785 Hyperlipidemia, unspecified: Secondary | ICD-10-CM | POA: Diagnosis not present

## 2017-05-02 DIAGNOSIS — Z7901 Long term (current) use of anticoagulants: Secondary | ICD-10-CM | POA: Diagnosis not present

## 2017-05-02 DIAGNOSIS — J9611 Chronic respiratory failure with hypoxia: Secondary | ICD-10-CM | POA: Diagnosis not present

## 2017-05-02 DIAGNOSIS — S51012D Laceration without foreign body of left elbow, subsequent encounter: Secondary | ICD-10-CM | POA: Diagnosis not present

## 2017-05-02 DIAGNOSIS — Z7902 Long term (current) use of antithrombotics/antiplatelets: Secondary | ICD-10-CM | POA: Diagnosis not present

## 2017-05-02 MED ORDER — LORAZEPAM 0.5 MG PO TABS: 0.5000 mg | ORAL_TABLET | Freq: Every day | ORAL | 3 refills | Status: DC

## 2017-05-02 MED ORDER — LORAZEPAM 1 MG PO TABS: 1.0000 mg | ORAL_TABLET | Freq: Every day | ORAL | 3 refills | Status: DC

## 2017-05-02 NOTE — Telephone Encounter (Signed)
Faxed request received for lorazepam from [facility] - E prescribed Reviewing chart, both lorazepam 0.5mg  and 1mg  [facility] with 3 RF on 04/16/2017. Please call [facility] to cancel that prescription.

## 2017-05-02 NOTE — Telephone Encounter (Signed)
Spoke with [facility] cancelling future refills per Dr. Darnell Level.   Spoke with Vaughan Basta at [facility] explaining a shipment for Lorazepam is on the way from [employer].  But the rest of refills will be at [facility].  Verbalizes understanding.

## 2017-05-03 DIAGNOSIS — E1122 Type 2 diabetes mellitus with diabetic chronic kidney disease: Secondary | ICD-10-CM | POA: Diagnosis not present

## 2017-05-03 DIAGNOSIS — I25700 Atherosclerosis of coronary artery bypass graft(s), unspecified, with unstable angina pectoris: Secondary | ICD-10-CM | POA: Diagnosis not present

## 2017-05-03 DIAGNOSIS — N183 Chronic kidney disease, stage 3 (moderate): Secondary | ICD-10-CM | POA: Diagnosis not present

## 2017-05-03 DIAGNOSIS — E1121 Type 2 diabetes mellitus with diabetic nephropathy: Secondary | ICD-10-CM | POA: Diagnosis not present

## 2017-05-03 DIAGNOSIS — I130 Hypertensive heart and chronic kidney disease with heart failure and stage 1 through stage 4 chronic kidney disease, or unspecified chronic kidney disease: Secondary | ICD-10-CM | POA: Diagnosis not present

## 2017-05-03 DIAGNOSIS — I5032 Chronic diastolic (congestive) heart failure: Secondary | ICD-10-CM | POA: Diagnosis not present

## 2017-05-04 DIAGNOSIS — I25700 Atherosclerosis of coronary artery bypass graft(s), unspecified, with unstable angina pectoris: Secondary | ICD-10-CM | POA: Diagnosis not present

## 2017-05-04 DIAGNOSIS — N183 Chronic kidney disease, stage 3 (moderate): Secondary | ICD-10-CM | POA: Diagnosis not present

## 2017-05-04 DIAGNOSIS — E1122 Type 2 diabetes mellitus with diabetic chronic kidney disease: Secondary | ICD-10-CM | POA: Diagnosis not present

## 2017-05-04 DIAGNOSIS — I5032 Chronic diastolic (congestive) heart failure: Secondary | ICD-10-CM | POA: Diagnosis not present

## 2017-05-04 DIAGNOSIS — E1121 Type 2 diabetes mellitus with diabetic nephropathy: Secondary | ICD-10-CM | POA: Diagnosis not present

## 2017-05-04 DIAGNOSIS — I130 Hypertensive heart and chronic kidney disease with heart failure and stage 1 through stage 4 chronic kidney disease, or unspecified chronic kidney disease: Secondary | ICD-10-CM | POA: Diagnosis not present

## 2017-05-05 DIAGNOSIS — E1121 Type 2 diabetes mellitus with diabetic nephropathy: Secondary | ICD-10-CM | POA: Diagnosis not present

## 2017-05-05 DIAGNOSIS — I25700 Atherosclerosis of coronary artery bypass graft(s), unspecified, with unstable angina pectoris: Secondary | ICD-10-CM | POA: Diagnosis not present

## 2017-05-05 DIAGNOSIS — E1122 Type 2 diabetes mellitus with diabetic chronic kidney disease: Secondary | ICD-10-CM | POA: Diagnosis not present

## 2017-05-05 DIAGNOSIS — I130 Hypertensive heart and chronic kidney disease with heart failure and stage 1 through stage 4 chronic kidney disease, or unspecified chronic kidney disease: Secondary | ICD-10-CM | POA: Diagnosis not present

## 2017-05-05 DIAGNOSIS — N183 Chronic kidney disease, stage 3 (moderate): Secondary | ICD-10-CM | POA: Diagnosis not present

## 2017-05-05 DIAGNOSIS — I5032 Chronic diastolic (congestive) heart failure: Secondary | ICD-10-CM | POA: Diagnosis not present

## 2017-05-05 NOTE — Telephone Encounter (Signed)
Returned call to [employer] with [facility] [employer] [facility]. Says she is faxing forms for Dr. Darnell Level to complete and sign for pt's lorazepam rx.

## 2017-05-05 NOTE — Telephone Encounter (Signed)
Melissa  From [facility] is calling to speak with Christine Blanchard  About the pts  lorazepam  [phone]

## 2017-05-06 DIAGNOSIS — I130 Hypertensive heart and chronic kidney disease with heart failure and stage 1 through stage 4 chronic kidney disease, or unspecified chronic kidney disease: Secondary | ICD-10-CM | POA: Diagnosis not present

## 2017-05-06 DIAGNOSIS — I5032 Chronic diastolic (congestive) heart failure: Secondary | ICD-10-CM | POA: Diagnosis not present

## 2017-05-06 DIAGNOSIS — N183 Chronic kidney disease, stage 3 (moderate): Secondary | ICD-10-CM | POA: Diagnosis not present

## 2017-05-06 DIAGNOSIS — I25700 Atherosclerosis of coronary artery bypass graft(s), unspecified, with unstable angina pectoris: Secondary | ICD-10-CM | POA: Diagnosis not present

## 2017-05-06 DIAGNOSIS — E1121 Type 2 diabetes mellitus with diabetic nephropathy: Secondary | ICD-10-CM | POA: Diagnosis not present

## 2017-05-06 DIAGNOSIS — E1122 Type 2 diabetes mellitus with diabetic chronic kidney disease: Secondary | ICD-10-CM | POA: Diagnosis not present

## 2017-05-07 NOTE — Telephone Encounter (Signed)
Received forms. I already E prescribed this to [facility], why do I need to fill out forms again?  plz check with [facility] should have everything they need. If we need to fill out forms, please start process. Placed in Lisa's box.

## 2017-05-09 DIAGNOSIS — E1121 Type 2 diabetes mellitus with diabetic nephropathy: Secondary | ICD-10-CM | POA: Diagnosis not present

## 2017-05-09 DIAGNOSIS — E1122 Type 2 diabetes mellitus with diabetic chronic kidney disease: Secondary | ICD-10-CM | POA: Diagnosis not present

## 2017-05-09 DIAGNOSIS — I130 Hypertensive heart and chronic kidney disease with heart failure and stage 1 through stage 4 chronic kidney disease, or unspecified chronic kidney disease: Secondary | ICD-10-CM | POA: Diagnosis not present

## 2017-05-09 DIAGNOSIS — N183 Chronic kidney disease, stage 3 (moderate): Secondary | ICD-10-CM | POA: Diagnosis not present

## 2017-05-09 DIAGNOSIS — I5032 Chronic diastolic (congestive) heart failure: Secondary | ICD-10-CM | POA: Diagnosis not present

## 2017-05-09 DIAGNOSIS — I25700 Atherosclerosis of coronary artery bypass graft(s), unspecified, with unstable angina pectoris: Secondary | ICD-10-CM | POA: Diagnosis not present

## 2017-05-09 NOTE — Telephone Encounter (Signed)
Spoke with Evette at [facility] explaining Dr. Darnell Level has already completed the form and escribed rxs to [facility].  She says ok and she will make a note.  She also mentioned they did received shipment from [facility] for pt.

## 2017-05-10 DIAGNOSIS — I25700 Atherosclerosis of coronary artery bypass graft(s), unspecified, with unstable angina pectoris: Secondary | ICD-10-CM | POA: Diagnosis not present

## 2017-05-10 DIAGNOSIS — E1121 Type 2 diabetes mellitus with diabetic nephropathy: Secondary | ICD-10-CM | POA: Diagnosis not present

## 2017-05-10 DIAGNOSIS — E1122 Type 2 diabetes mellitus with diabetic chronic kidney disease: Secondary | ICD-10-CM | POA: Diagnosis not present

## 2017-05-10 DIAGNOSIS — N183 Chronic kidney disease, stage 3 (moderate): Secondary | ICD-10-CM | POA: Diagnosis not present

## 2017-05-10 DIAGNOSIS — I130 Hypertensive heart and chronic kidney disease with heart failure and stage 1 through stage 4 chronic kidney disease, or unspecified chronic kidney disease: Secondary | ICD-10-CM | POA: Diagnosis not present

## 2017-05-10 DIAGNOSIS — I5032 Chronic diastolic (congestive) heart failure: Secondary | ICD-10-CM | POA: Diagnosis not present

## 2017-05-11 DIAGNOSIS — N183 Chronic kidney disease, stage 3 (moderate): Secondary | ICD-10-CM | POA: Diagnosis not present

## 2017-05-11 DIAGNOSIS — I5032 Chronic diastolic (congestive) heart failure: Secondary | ICD-10-CM | POA: Diagnosis not present

## 2017-05-11 DIAGNOSIS — E1121 Type 2 diabetes mellitus with diabetic nephropathy: Secondary | ICD-10-CM | POA: Diagnosis not present

## 2017-05-11 DIAGNOSIS — I25700 Atherosclerosis of coronary artery bypass graft(s), unspecified, with unstable angina pectoris: Secondary | ICD-10-CM | POA: Diagnosis not present

## 2017-05-11 DIAGNOSIS — I130 Hypertensive heart and chronic kidney disease with heart failure and stage 1 through stage 4 chronic kidney disease, or unspecified chronic kidney disease: Secondary | ICD-10-CM | POA: Diagnosis not present

## 2017-05-11 DIAGNOSIS — E1122 Type 2 diabetes mellitus with diabetic chronic kidney disease: Secondary | ICD-10-CM | POA: Diagnosis not present

## 2017-05-12 DIAGNOSIS — I130 Hypertensive heart and chronic kidney disease with heart failure and stage 1 through stage 4 chronic kidney disease, or unspecified chronic kidney disease: Secondary | ICD-10-CM | POA: Diagnosis not present

## 2017-05-12 DIAGNOSIS — I25700 Atherosclerosis of coronary artery bypass graft(s), unspecified, with unstable angina pectoris: Secondary | ICD-10-CM | POA: Diagnosis not present

## 2017-05-12 DIAGNOSIS — E1121 Type 2 diabetes mellitus with diabetic nephropathy: Secondary | ICD-10-CM | POA: Diagnosis not present

## 2017-05-12 DIAGNOSIS — I5032 Chronic diastolic (congestive) heart failure: Secondary | ICD-10-CM | POA: Diagnosis not present

## 2017-05-12 DIAGNOSIS — N183 Chronic kidney disease, stage 3 (moderate): Secondary | ICD-10-CM | POA: Diagnosis not present

## 2017-05-12 DIAGNOSIS — E1122 Type 2 diabetes mellitus with diabetic chronic kidney disease: Secondary | ICD-10-CM | POA: Diagnosis not present

## 2017-05-13 ENCOUNTER — Other Ambulatory Visit: Payer: Self-pay | Admitting: Family Medicine

## 2017-05-13 ENCOUNTER — Ambulatory Visit (INDEPENDENT_AMBULATORY_CARE_PROVIDER_SITE_OTHER): Admitting: Family Medicine

## 2017-05-13 ENCOUNTER — Encounter: Payer: Self-pay | Admitting: Family Medicine

## 2017-05-13 VITALS — BP 122/72 | HR 68 | Temp 98.5°F | Ht 65.0 in | Wt 133.5 lb

## 2017-05-13 DIAGNOSIS — R531 Weakness: Secondary | ICD-10-CM | POA: Diagnosis not present

## 2017-05-13 DIAGNOSIS — I25708 Atherosclerosis of coronary artery bypass graft(s), unspecified, with other forms of angina pectoris: Secondary | ICD-10-CM | POA: Diagnosis not present

## 2017-05-13 DIAGNOSIS — N183 Chronic kidney disease, stage 3 unspecified: Secondary | ICD-10-CM

## 2017-05-13 DIAGNOSIS — I5032 Chronic diastolic (congestive) heart failure: Secondary | ICD-10-CM

## 2017-05-13 DIAGNOSIS — F331 Major depressive disorder, recurrent, moderate: Secondary | ICD-10-CM

## 2017-05-13 DIAGNOSIS — I10 Essential (primary) hypertension: Secondary | ICD-10-CM

## 2017-05-13 DIAGNOSIS — I130 Hypertensive heart and chronic kidney disease with heart failure and stage 1 through stage 4 chronic kidney disease, or unspecified chronic kidney disease: Secondary | ICD-10-CM | POA: Diagnosis not present

## 2017-05-13 DIAGNOSIS — J9611 Chronic respiratory failure with hypoxia: Secondary | ICD-10-CM | POA: Diagnosis not present

## 2017-05-13 DIAGNOSIS — I25700 Atherosclerosis of coronary artery bypass graft(s), unspecified, with unstable angina pectoris: Secondary | ICD-10-CM | POA: Diagnosis not present

## 2017-05-13 DIAGNOSIS — E1121 Type 2 diabetes mellitus with diabetic nephropathy: Secondary | ICD-10-CM | POA: Diagnosis not present

## 2017-05-13 DIAGNOSIS — Z66 Do not resuscitate: Secondary | ICD-10-CM

## 2017-05-13 DIAGNOSIS — Z515 Encounter for palliative care: Secondary | ICD-10-CM

## 2017-05-13 DIAGNOSIS — E559 Vitamin D deficiency, unspecified: Secondary | ICD-10-CM

## 2017-05-13 DIAGNOSIS — E1122 Type 2 diabetes mellitus with diabetic chronic kidney disease: Secondary | ICD-10-CM | POA: Diagnosis not present

## 2017-05-13 DIAGNOSIS — E7800 Pure hypercholesterolemia, unspecified: Secondary | ICD-10-CM | POA: Diagnosis not present

## 2017-05-13 LAB — BASIC METABOLIC PANEL
BUN: 12 mg/dL (ref 6–23)
CHLORIDE: 96 meq/L (ref 96–112)
CO2: 36 meq/L — AB (ref 19–32)
CREATININE: 0.87 mg/dL (ref 0.40–1.20)
Calcium: 9.4 mg/dL (ref 8.4–10.5)
GFR: 65.46 mL/min (ref >60.00)
Glucose, Bld: 149 mg/dL — ABNORMAL HIGH (ref 70–99)
POTASSIUM: 3.6 meq/L (ref 3.5–5.1)
SODIUM: 141 meq/L (ref 135–145)

## 2017-05-13 LAB — HEMOGLOBIN A1C: Hgb A1c MFr Bld: 7.6 % — ABNORMAL HIGH (ref 4.6–6.5)

## 2017-05-13 LAB — MICROALBUMIN / CREATININE URINE RATIO
CREATININE, U: 19 mg/dL
MICROALB/CREAT RATIO: 3.7 mg/g (ref 0.0–30.0)
Microalb, Ur: <0.7 mg/dL (ref 0.0–1.9)

## 2017-05-13 LAB — TSH: TSH: 1.48 u[IU]/mL (ref 0.35–4.50)

## 2017-05-13 LAB — LDL CHOLESTEROL, DIRECT: LDL DIRECT: 90 mg/dL

## 2017-05-13 LAB — VITAMIN D 25 HYDROXY (VIT D DEFICIENCY, FRACTURES): VITD: 56.62 ng/mL (ref 30.00–100.00)

## 2017-05-13 NOTE — Patient Instructions (Addendum)
Labs today, urine microalbumin today Happy early birthday! You are doing ok today.  Return in 4 months for follow up visit.

## 2017-05-13 NOTE — Assessment & Plan Note (Signed)
Continue oxygen at 2 L.  

## 2017-05-13 NOTE — Assessment & Plan Note (Signed)
Update level.  

## 2017-05-13 NOTE — Progress Notes (Signed)
BP 122/72 (BP Location: Left Arm, Patient Position: Sitting, Cuff Size: Normal)   Pulse 68   Temp 98.5 F (36.9 C) (Oral)   Ht 5' 5"  (1.651 m)   Wt 133 lb 8 oz (60.6 kg)   SpO2 97% Comment: 2 L, pulsating  BMI 22.22 kg/m    CC: 4 mo f/u visit Subjective:    Patient ID: Christine Blanchard, female    DOB: 01/18/1929, 84 y.o.   MRN: [id number]  HPI: Christine Blanchard is a 84 y.o. female presenting on 05/13/2017 for 4 mo follow-up   Hospice patient at [facility]. Here alone today.  Requests lorazepam filled at [facility] and [facility].  Chest and abdominal pressures/tightness sensation has improved since increased imdur dose to 7m.  Stays fatigued and tired, notes weakness. No appetite. Eating more meals in her room "I don't feel like going to the dining room" DM - due for labwork today.  Lab Results  Component Value Date   HGBA1C 7.4 (H) 07/21/2016    Diabetic Foot Exam - Simple   Simple Foot Form Diabetic Foot exam was performed with the following findings:  Yes 05/13/2017 10:36 AM  Visual Inspection No deformities, no ulcerations, no other skin breakdown bilaterally:  Yes Sensation Testing See comments:  Yes Pulse Check Posterior Tibialis and Dorsalis pulse intact bilaterally:  Yes Comments Diminished sensation to monofilament      R lower leg lesion present for a year - this has cleared up.   Relevant past medical, surgical, family and social history reviewed and updated as indicated. Interim medical history since our last visit reviewed. Allergies and medications reviewed and updated. Outpatient Medications Prior to Visit  Medication Sig Dispense Refill  . acetaminophen (TYLENOL) 500 MG tablet Take 500 mg by mouth 3 (three) times daily.    . ASPIRIN LOW DOSE 81 MG chewable tablet TAKE 1 TABLET BY MOUTH ONCE DAILY 30 tablet 6  . atorvastatin (LIPITOR) 20 MG tablet TAKE 1 TABLET NIGHTLY 90 tablet 0  . BD AUTOSHIELD DUO 30G X 5 MM MISC Use as  instructed with insulin Dx: E11.21 100 each 3  . cetirizine (ZYRTEC) 10 MG tablet Take 1 tablet (10 mg total) by mouth daily as needed for allergies. 30 tablet 5  . clopidogrel (PLAVIX) 75 MG tablet TAKE 1 TABLET DAILY 90 tablet 3  . docusate sodium (COLACE) 100 MG capsule Take 1 capsule (100 mg total) by mouth daily. (Patient taking differently: Take 100 mg by mouth daily. Taking 1 tablet twice a day) 90 capsule 1  . glucose blood (FREESTYLE LITE) test strip Use to check sugar three times daily Dx: 250.40 100 each 3  . glucose monitoring kit (FREESTYLE) monitoring kit 1 each by Does not apply route as needed for other. 1 each 0  . isosorbide mononitrate (IMDUR) 60 MG 24 hr tablet TAKE 1 TABLET AT BEDTIME 90 tablet 1  . KLOR-CON M10 10 MEQ tablet TAKE 2 TABLETS DAILY 180 tablet 1  . Lancets (FREESTYLE) lancets USE TO CHECK SUGAR THREE TIMES A DAY 300 each 2  . LANTUS SOLOSTAR 100 UNIT/ML Solostar Pen INJECT 10 UNITS UNDER THE SKIN DAILY WITH BREAKFAST (DIRECTION CHANGE) 15 mL 6  . LORazepam (ATIVAN) 0.5 MG tablet Take 1 tablet (0.5 mg total) by mouth daily. At noon 30 tablet 3  . LORazepam (ATIVAN) 1 MG tablet Take 1 tablet (1 mg total) by mouth at bedtime. At 10pm 30 tablet 3  . magnesium  hydroxide (MILK OF MAGNESIA) 400 MG/5ML suspension Take 30 mLs by mouth daily as needed for mild constipation. *IF NO RESULTS, SEE FLEETS ORDER*    . meclizine (ANTIVERT) 12.5 MG tablet One tablet by mouth daily as needed for vertigo 30 tablet 0  . metFORMIN (GLUCOPHAGE-XR) 500 MG 24 hr tablet TAKE 1 TABLET AT BEDTIME 90 tablet 0  . metoprolol tartrate (LOPRESSOR) 25 MG tablet TAKE 1 TABLET TWICE A DAY 180 tablet 3  . Multiple Vitamin (MULTIVITAMIN) capsule Take 1 capsule by mouth daily.      . nitroGLYCERIN (NITROSTAT) 0.4 MG SL tablet DISSOLVE 1 TABLET UNDER THE TONGUE AS NEEDED 25 tablet 2  . ondansetron (ZOFRAN) 4 MG tablet Take 1 tablet (4 mg total) by mouth 2 (two) times daily as needed for nausea or  vomiting. 30 tablet 1  . pantoprazole (PROTONIX) 40 MG tablet TAKE 1 TABLET DAILY 90 tablet 0  . prednisoLONE sodium phosphate (INFLAMASE FORTE) 1 % ophthalmic solution Place 1 drop into both eyes daily. 5 mL 3  . ranitidine (ZANTAC) 150 MG tablet Take 1 tablet (150 mg total) by mouth at bedtime. 90 tablet 1  . sertraline (ZOLOFT) 50 MG tablet TAKE 1 TABLET DAILY 90 tablet 1  . SPIRIVA HANDIHALER 18 MCG inhalation capsule INHALE THE CONTENTS OF 1 CAPSULE VIA HANDIHALER DAILY 90 capsule 2  . torsemide (DEMADEX) 20 MG tablet TAKE 2 TABLETS EVERY MORNING AND 1 EXTRA TABLET ON MONDAY, WEDNESDAY, AND FRIDAY IF NEEDED 225 tablet 1  . traMADol (ULTRAM) 50 MG tablet TAKE ONE TABLET BY MOUTH 2 TIMES A DAY AT 3PM AND 10PM 60 tablet 3  . traZODone (DESYREL) 50 MG tablet TAKE ONE-HALF (1/2) TO 1 TABLET AT BEDTIME AS NEEDED FOR SLEEP 90 tablet 1  . Vitamin D, Ergocalciferol, (DRISDOL) 50000 units CAPS capsule TAKE 1 CAPSULE EVERY WEEK 12 capsule 1  . magnesium hydroxide (MILK OF MAGNESIA) 800 MG/5ML suspension Take by mouth daily as needed for constipation.     No facility-administered medications prior to visit.      Per HPI unless specifically indicated in ROS section below Review of Systems     Objective:    BP 122/72 (BP Location: Left Arm, Patient Position: Sitting, Cuff Size: Normal)   Pulse 68   Temp 98.5 F (36.9 C) (Oral)   Ht 5' 5"  (1.651 m)   Wt 133 lb 8 oz (60.6 kg)   SpO2 97% Comment: 2 L, pulsating  BMI 22.22 kg/m   Wt Readings from Last 3 Encounters:  05/13/17 133 lb 8 oz (60.6 kg)  01/20/17 136 lb 4 oz (61.8 kg)  10/25/16 134 lb 4 oz (60.9 kg)    Physical Exam  Constitutional: She appears well-developed and well-nourished. No distress.  HENT:  Head: Normocephalic and atraumatic.  Right Ear: External ear normal.  Left Ear: External ear normal.  Nose: Nose normal.  Mouth/Throat: Oropharynx is clear and moist. No oropharyngeal exudate.  Eyes: Pupils are equal, round, and  reactive to light. Conjunctivae and EOM are normal. No scleral icterus.  Neck: Normal range of motion. Neck supple.  Cardiovascular: Normal rate, regular rhythm, normal heart sounds and intact distal pulses.  No murmur heard. Pulmonary/Chest: Effort normal and breath sounds normal. No respiratory distress. She has no wheezes. She has no rales.  Musculoskeletal: She exhibits no edema.  See HPI for foot exam if done  Lymphadenopathy:    She has no cervical adenopathy.  Skin: Skin is warm and dry. No rash  noted.  Psychiatric: She has a normal mood and affect.  Nursing note and vitals reviewed.  Results for orders placed or performed in visit on 10/21/16  HM DIABETES EYE EXAM  Result Value Ref Range   HM Diabetic Eye Exam No Retinopathy No Retinopathy      Assessment & Plan:   Problem List Items Addressed This Visit    CAD (coronary artery disease) of artery bypass graft    Chronic, severe, inoperable. Doing better on higher imdur dose - continue.       Chronic diastolic CHF (congestive heart failure) (HCC)    Chronic, stable. Established with hospice. Continue torsemide, check labs today.       Chronic hypoxemic respiratory failure (HCC)    Continue oxygen at 2L.      CKD (chronic kidney disease) stage 3, GFR 30-59 ml/min (HCC)   Relevant Orders   Basic metabolic panel   TSH   DNR (do not resuscitate)   Essential hypertension    Chronic, stable.       General weakness   Hospice care patient   Hyperlipidemia    Chronic, stable. Continue atorvastatin. Update d LDL today (not fasting)      MDD (major depressive disorder), recurrent episode, moderate (HCC)    Chronic, stable. Continue sertraline 69m daily along with lorazepam 0.593mat noon and 57m47mt bedtime.        Vitamin D deficiency    Update level.       Relevant Orders   VITAMIN D 25 Hydroxy (Vit-D Deficiency, Fractures)   Well controlled type 2 diabetes mellitus with nephropathy ([facility]    Chronic,  stable. Continue current regimen. Update labs.       Relevant Orders   Hemoglobin [id number] metabolic panel   Microalbumin / creatinine urine ratio   LDL Cholesterol, Direct       No orders of the defined types were placed in this encounter.  Orders Placed This Encounter  Procedures  . Hemoglobin A1c  . Basic metabolic panel  . TSH  . Microalbumin / creatinine urine ratio  . LDL Cholesterol, Direct  . VITAMIN D 25 Hydroxy (Vit-D Deficiency, Fractures)    Follow up plan: Return in about 4 months (around 09/12/2017), or if symptoms worsen or fail to improve, for follow up visit.  JavRia BushD

## 2017-05-13 NOTE — Assessment & Plan Note (Signed)
Chronic, stable. Continue atorvastatin. Update d LDL today (not fasting)

## 2017-05-13 NOTE — Assessment & Plan Note (Signed)
Chronic, stable 

## 2017-05-13 NOTE — Assessment & Plan Note (Signed)
Chronic, stable. Established with hospice. Continue torsemide, check labs today.

## 2017-05-13 NOTE — Assessment & Plan Note (Signed)
Chronic, stable. Continue current regimen. Update labs.

## 2017-05-13 NOTE — Assessment & Plan Note (Signed)
Chronic, severe, inoperable. Doing better on higher imdur dose - continue.

## 2017-05-13 NOTE — Assessment & Plan Note (Signed)
Chronic, stable. Continue sertraline 50mg  daily along with lorazepam 0.5mg  at noon and 1mg  at bedtime.

## 2017-05-16 ENCOUNTER — Telehealth: Payer: Self-pay | Admitting: Family Medicine

## 2017-05-16 DIAGNOSIS — E1121 Type 2 diabetes mellitus with diabetic nephropathy: Secondary | ICD-10-CM | POA: Diagnosis not present

## 2017-05-16 DIAGNOSIS — N183 Chronic kidney disease, stage 3 (moderate): Secondary | ICD-10-CM | POA: Diagnosis not present

## 2017-05-16 DIAGNOSIS — I5032 Chronic diastolic (congestive) heart failure: Secondary | ICD-10-CM | POA: Diagnosis not present

## 2017-05-16 DIAGNOSIS — E1122 Type 2 diabetes mellitus with diabetic chronic kidney disease: Secondary | ICD-10-CM | POA: Diagnosis not present

## 2017-05-16 DIAGNOSIS — I130 Hypertensive heart and chronic kidney disease with heart failure and stage 1 through stage 4 chronic kidney disease, or unspecified chronic kidney disease: Secondary | ICD-10-CM | POA: Diagnosis not present

## 2017-05-16 DIAGNOSIS — I25700 Atherosclerosis of coronary artery bypass graft(s), unspecified, with unstable angina pectoris: Secondary | ICD-10-CM | POA: Diagnosis not present

## 2017-05-16 MED ORDER — TRAZODONE HCL 50 MG PO TABS: ORAL_TABLET | ORAL | 3 refills | Status: DC

## 2017-05-16 NOTE — Telephone Encounter (Signed)
Copied from [place] [phone]. Topic: Quick Communication - See Telephone Encounter >> May 16, 2017 11:25 AM Aurelio Brash B wrote: CRM for notification. See Telephone encounter for: 05/16/17. Melissa from [facility] called - Pt needs hard copy of rx  for traZODone (DESYREL) 50 MG tablet    Contact number [phone]

## 2017-05-16 NOTE — Telephone Encounter (Signed)
Last Rx 05/2016 #90 1R. Last OV 05/2017. pls advise

## 2017-05-16 NOTE — Telephone Encounter (Signed)
Spoke with Vaughan Basta at [facility] concerning pt's rx.  She asked that it be faxed to her attention Vaughan Basta) at [phone].   Faxed rx.

## 2017-05-16 NOTE — Telephone Encounter (Signed)
Printed and in Lisa's box.  

## 2017-05-17 DIAGNOSIS — I130 Hypertensive heart and chronic kidney disease with heart failure and stage 1 through stage 4 chronic kidney disease, or unspecified chronic kidney disease: Secondary | ICD-10-CM | POA: Diagnosis not present

## 2017-05-17 DIAGNOSIS — E1122 Type 2 diabetes mellitus with diabetic chronic kidney disease: Secondary | ICD-10-CM | POA: Diagnosis not present

## 2017-05-17 DIAGNOSIS — N183 Chronic kidney disease, stage 3 (moderate): Secondary | ICD-10-CM | POA: Diagnosis not present

## 2017-05-17 DIAGNOSIS — I5032 Chronic diastolic (congestive) heart failure: Secondary | ICD-10-CM | POA: Diagnosis not present

## 2017-05-17 DIAGNOSIS — E1121 Type 2 diabetes mellitus with diabetic nephropathy: Secondary | ICD-10-CM | POA: Diagnosis not present

## 2017-05-17 DIAGNOSIS — I25700 Atherosclerosis of coronary artery bypass graft(s), unspecified, with unstable angina pectoris: Secondary | ICD-10-CM | POA: Diagnosis not present

## 2017-05-18 ENCOUNTER — Other Ambulatory Visit: Payer: Self-pay | Admitting: Family Medicine

## 2017-05-18 DIAGNOSIS — I130 Hypertensive heart and chronic kidney disease with heart failure and stage 1 through stage 4 chronic kidney disease, or unspecified chronic kidney disease: Secondary | ICD-10-CM | POA: Diagnosis not present

## 2017-05-18 DIAGNOSIS — I25700 Atherosclerosis of coronary artery bypass graft(s), unspecified, with unstable angina pectoris: Secondary | ICD-10-CM | POA: Diagnosis not present

## 2017-05-18 DIAGNOSIS — E1122 Type 2 diabetes mellitus with diabetic chronic kidney disease: Secondary | ICD-10-CM | POA: Diagnosis not present

## 2017-05-18 DIAGNOSIS — I5032 Chronic diastolic (congestive) heart failure: Secondary | ICD-10-CM | POA: Diagnosis not present

## 2017-05-18 DIAGNOSIS — E1121 Type 2 diabetes mellitus with diabetic nephropathy: Secondary | ICD-10-CM | POA: Diagnosis not present

## 2017-05-18 DIAGNOSIS — N183 Chronic kidney disease, stage 3 (moderate): Secondary | ICD-10-CM | POA: Diagnosis not present

## 2017-05-18 NOTE — Telephone Encounter (Signed)
Copied from [place] [phone]. Topic: Quick Communication - Rx Refill/Question >> May 18, 2017 12:58 PM Selinda Flavin B, NT wrote: Medication: traMADol (ULTRAM) 50 MG tablet Has the patient contacted their pharmacy? Yes.   (Agent: If no, request that the patient contact the pharmacy for the refill.) Preferred Pharmacy (with phone number or street name): [place] Agent: Please be advised that RX refills may take up to 3 business days. We ask that you follow-up with your pharmacy.

## 2017-05-18 NOTE — Telephone Encounter (Signed)
Pt requesting refill of Tramadol 50mg  tablet.   LOV: 05/13/17  Dr. Danise Mina  [facility]

## 2017-05-18 NOTE — Telephone Encounter (Signed)
Last rx:  02/23/17, #60 Next OV:  09/14/17

## 2017-05-19 DIAGNOSIS — I130 Hypertensive heart and chronic kidney disease with heart failure and stage 1 through stage 4 chronic kidney disease, or unspecified chronic kidney disease: Secondary | ICD-10-CM | POA: Diagnosis not present

## 2017-05-19 DIAGNOSIS — E1122 Type 2 diabetes mellitus with diabetic chronic kidney disease: Secondary | ICD-10-CM | POA: Diagnosis not present

## 2017-05-19 DIAGNOSIS — E1121 Type 2 diabetes mellitus with diabetic nephropathy: Secondary | ICD-10-CM | POA: Diagnosis not present

## 2017-05-19 DIAGNOSIS — I25700 Atherosclerosis of coronary artery bypass graft(s), unspecified, with unstable angina pectoris: Secondary | ICD-10-CM | POA: Diagnosis not present

## 2017-05-19 DIAGNOSIS — N183 Chronic kidney disease, stage 3 (moderate): Secondary | ICD-10-CM | POA: Diagnosis not present

## 2017-05-19 DIAGNOSIS — I5032 Chronic diastolic (congestive) heart failure: Secondary | ICD-10-CM | POA: Diagnosis not present

## 2017-05-19 MED ORDER — TRAMADOL HCL 50 MG PO TABS: ORAL_TABLET | ORAL | 3 refills | Status: DC

## 2017-05-19 NOTE — Telephone Encounter (Signed)
Eprescribed.

## 2017-05-20 ENCOUNTER — Telehealth: Payer: Self-pay | Admitting: Family Medicine

## 2017-05-20 DIAGNOSIS — I25700 Atherosclerosis of coronary artery bypass graft(s), unspecified, with unstable angina pectoris: Secondary | ICD-10-CM | POA: Diagnosis not present

## 2017-05-20 DIAGNOSIS — I5032 Chronic diastolic (congestive) heart failure: Secondary | ICD-10-CM | POA: Diagnosis not present

## 2017-05-20 DIAGNOSIS — N183 Chronic kidney disease, stage 3 (moderate): Secondary | ICD-10-CM | POA: Diagnosis not present

## 2017-05-20 DIAGNOSIS — E1121 Type 2 diabetes mellitus with diabetic nephropathy: Secondary | ICD-10-CM | POA: Diagnosis not present

## 2017-05-20 DIAGNOSIS — E1122 Type 2 diabetes mellitus with diabetic chronic kidney disease: Secondary | ICD-10-CM | POA: Diagnosis not present

## 2017-05-20 DIAGNOSIS — I130 Hypertensive heart and chronic kidney disease with heart failure and stage 1 through stage 4 chronic kidney disease, or unspecified chronic kidney disease: Secondary | ICD-10-CM | POA: Diagnosis not present

## 2017-05-20 NOTE — Telephone Encounter (Signed)
Copied from [place] [id number]. Topic: Quick Communication - See Telephone Encounter >> May 20, 2017  9:52 AM Ether Griffins B wrote: CRM for notification. See Telephone encounter for: 05/20/17.  [place] calling for clarification on the Trazodone. Pt was on a scheduled does now it has been changed to as needed. The pt is having trouble sleeping at night and the pharmacy is wanting to know if it could be changed back to scheduled. They are also needing clarification on the 1/2 to 1 tablet to take. They home she is in needs to know if 1/2 or 1 tablet should be taken they cant determine when she only needs 1/2 or 1. Call back number [phone] ext [phone].

## 2017-05-23 ENCOUNTER — Telehealth: Payer: Self-pay | Admitting: Family Medicine

## 2017-05-23 DIAGNOSIS — I130 Hypertensive heart and chronic kidney disease with heart failure and stage 1 through stage 4 chronic kidney disease, or unspecified chronic kidney disease: Secondary | ICD-10-CM | POA: Diagnosis not present

## 2017-05-23 DIAGNOSIS — E1121 Type 2 diabetes mellitus with diabetic nephropathy: Secondary | ICD-10-CM | POA: Diagnosis not present

## 2017-05-23 DIAGNOSIS — I5032 Chronic diastolic (congestive) heart failure: Secondary | ICD-10-CM | POA: Diagnosis not present

## 2017-05-23 DIAGNOSIS — E1122 Type 2 diabetes mellitus with diabetic chronic kidney disease: Secondary | ICD-10-CM | POA: Diagnosis not present

## 2017-05-23 DIAGNOSIS — I25700 Atherosclerosis of coronary artery bypass graft(s), unspecified, with unstable angina pectoris: Secondary | ICD-10-CM | POA: Diagnosis not present

## 2017-05-23 DIAGNOSIS — N183 Chronic kidney disease, stage 3 (moderate): Secondary | ICD-10-CM | POA: Diagnosis not present

## 2017-05-23 NOTE — Telephone Encounter (Signed)
Copied from [place] [phone]. Topic: Quick Communication - See Telephone Encounter >> May 23, 2017  9:31 AM Robina Ade, Helene Kelp D wrote: CRM for notification. See Telephone encounter for: 05/23/17. Hinton Dyer with [employer] called requesting a change on medication instead of it saying prn for it to say schedule instead of as needed. And if the provider would please write down how much pt needs to take. Her call back number is [phone] [phone]. Please call her back for more information.

## 2017-05-23 NOTE — Telephone Encounter (Signed)
Changed. Form in my out box to fax back.

## 2017-05-23 NOTE — Telephone Encounter (Signed)
There are 3 separate phone messages on the same issue making it difficult to keep track and wasting time - can we try to consolidate to one phone message per item when opening CRM?

## 2017-05-23 NOTE — Telephone Encounter (Signed)
Copied from [place] [phone]. Topic: Quick Communication - See Telephone Encounter >> May 23, 2017  8:58 AM Arletha Grippe wrote: CRM for notification. See Telephone encounter for: 05/23/17. Vaughan Basta called form [employer] - pt is asking for trazadone, it is written as prn.  The order needs to either say half a tablet or a whole tablet at bedtime as needed for sleep. Cb is [phone]

## 2017-05-24 DIAGNOSIS — E1122 Type 2 diabetes mellitus with diabetic chronic kidney disease: Secondary | ICD-10-CM | POA: Diagnosis not present

## 2017-05-24 DIAGNOSIS — I130 Hypertensive heart and chronic kidney disease with heart failure and stage 1 through stage 4 chronic kidney disease, or unspecified chronic kidney disease: Secondary | ICD-10-CM | POA: Diagnosis not present

## 2017-05-24 DIAGNOSIS — I5032 Chronic diastolic (congestive) heart failure: Secondary | ICD-10-CM | POA: Diagnosis not present

## 2017-05-24 DIAGNOSIS — N183 Chronic kidney disease, stage 3 (moderate): Secondary | ICD-10-CM | POA: Diagnosis not present

## 2017-05-24 DIAGNOSIS — I25700 Atherosclerosis of coronary artery bypass graft(s), unspecified, with unstable angina pectoris: Secondary | ICD-10-CM | POA: Diagnosis not present

## 2017-05-24 DIAGNOSIS — E1121 Type 2 diabetes mellitus with diabetic nephropathy: Secondary | ICD-10-CM | POA: Diagnosis not present

## 2017-05-25 DIAGNOSIS — I130 Hypertensive heart and chronic kidney disease with heart failure and stage 1 through stage 4 chronic kidney disease, or unspecified chronic kidney disease: Secondary | ICD-10-CM | POA: Diagnosis not present

## 2017-05-25 DIAGNOSIS — N183 Chronic kidney disease, stage 3 (moderate): Secondary | ICD-10-CM | POA: Diagnosis not present

## 2017-05-25 DIAGNOSIS — I25700 Atherosclerosis of coronary artery bypass graft(s), unspecified, with unstable angina pectoris: Secondary | ICD-10-CM | POA: Diagnosis not present

## 2017-05-25 DIAGNOSIS — E1121 Type 2 diabetes mellitus with diabetic nephropathy: Secondary | ICD-10-CM | POA: Diagnosis not present

## 2017-05-25 DIAGNOSIS — E1122 Type 2 diabetes mellitus with diabetic chronic kidney disease: Secondary | ICD-10-CM | POA: Diagnosis not present

## 2017-05-25 DIAGNOSIS — I5032 Chronic diastolic (congestive) heart failure: Secondary | ICD-10-CM | POA: Diagnosis not present

## 2017-05-26 DIAGNOSIS — I5032 Chronic diastolic (congestive) heart failure: Secondary | ICD-10-CM | POA: Diagnosis not present

## 2017-05-26 DIAGNOSIS — I130 Hypertensive heart and chronic kidney disease with heart failure and stage 1 through stage 4 chronic kidney disease, or unspecified chronic kidney disease: Secondary | ICD-10-CM | POA: Diagnosis not present

## 2017-05-26 DIAGNOSIS — E1121 Type 2 diabetes mellitus with diabetic nephropathy: Secondary | ICD-10-CM | POA: Diagnosis not present

## 2017-05-26 DIAGNOSIS — N183 Chronic kidney disease, stage 3 (moderate): Secondary | ICD-10-CM | POA: Diagnosis not present

## 2017-05-26 DIAGNOSIS — I25700 Atherosclerosis of coronary artery bypass graft(s), unspecified, with unstable angina pectoris: Secondary | ICD-10-CM | POA: Diagnosis not present

## 2017-05-26 DIAGNOSIS — E1122 Type 2 diabetes mellitus with diabetic chronic kidney disease: Secondary | ICD-10-CM | POA: Diagnosis not present

## 2017-05-27 DIAGNOSIS — N183 Chronic kidney disease, stage 3 (moderate): Secondary | ICD-10-CM | POA: Diagnosis not present

## 2017-05-27 DIAGNOSIS — I130 Hypertensive heart and chronic kidney disease with heart failure and stage 1 through stage 4 chronic kidney disease, or unspecified chronic kidney disease: Secondary | ICD-10-CM | POA: Diagnosis not present

## 2017-05-27 DIAGNOSIS — E1122 Type 2 diabetes mellitus with diabetic chronic kidney disease: Secondary | ICD-10-CM | POA: Diagnosis not present

## 2017-05-27 DIAGNOSIS — I5032 Chronic diastolic (congestive) heart failure: Secondary | ICD-10-CM | POA: Diagnosis not present

## 2017-05-27 DIAGNOSIS — E1121 Type 2 diabetes mellitus with diabetic nephropathy: Secondary | ICD-10-CM | POA: Diagnosis not present

## 2017-05-27 DIAGNOSIS — I25700 Atherosclerosis of coronary artery bypass graft(s), unspecified, with unstable angina pectoris: Secondary | ICD-10-CM | POA: Diagnosis not present

## 2017-05-30 DIAGNOSIS — N183 Chronic kidney disease, stage 3 (moderate): Secondary | ICD-10-CM | POA: Diagnosis not present

## 2017-05-30 DIAGNOSIS — I5032 Chronic diastolic (congestive) heart failure: Secondary | ICD-10-CM | POA: Diagnosis not present

## 2017-05-30 DIAGNOSIS — I25700 Atherosclerosis of coronary artery bypass graft(s), unspecified, with unstable angina pectoris: Secondary | ICD-10-CM | POA: Diagnosis not present

## 2017-05-30 DIAGNOSIS — E1121 Type 2 diabetes mellitus with diabetic nephropathy: Secondary | ICD-10-CM | POA: Diagnosis not present

## 2017-05-30 DIAGNOSIS — I130 Hypertensive heart and chronic kidney disease with heart failure and stage 1 through stage 4 chronic kidney disease, or unspecified chronic kidney disease: Secondary | ICD-10-CM | POA: Diagnosis not present

## 2017-05-30 DIAGNOSIS — E1122 Type 2 diabetes mellitus with diabetic chronic kidney disease: Secondary | ICD-10-CM | POA: Diagnosis not present

## 2017-05-31 DIAGNOSIS — E1122 Type 2 diabetes mellitus with diabetic chronic kidney disease: Secondary | ICD-10-CM | POA: Diagnosis not present

## 2017-05-31 DIAGNOSIS — I5032 Chronic diastolic (congestive) heart failure: Secondary | ICD-10-CM | POA: Diagnosis not present

## 2017-05-31 DIAGNOSIS — N183 Chronic kidney disease, stage 3 (moderate): Secondary | ICD-10-CM | POA: Diagnosis not present

## 2017-05-31 DIAGNOSIS — I25700 Atherosclerosis of coronary artery bypass graft(s), unspecified, with unstable angina pectoris: Secondary | ICD-10-CM | POA: Diagnosis not present

## 2017-05-31 DIAGNOSIS — E1121 Type 2 diabetes mellitus with diabetic nephropathy: Secondary | ICD-10-CM | POA: Diagnosis not present

## 2017-05-31 DIAGNOSIS — I130 Hypertensive heart and chronic kidney disease with heart failure and stage 1 through stage 4 chronic kidney disease, or unspecified chronic kidney disease: Secondary | ICD-10-CM | POA: Diagnosis not present

## 2017-06-01 DIAGNOSIS — I130 Hypertensive heart and chronic kidney disease with heart failure and stage 1 through stage 4 chronic kidney disease, or unspecified chronic kidney disease: Secondary | ICD-10-CM | POA: Diagnosis not present

## 2017-06-01 DIAGNOSIS — S51012D Laceration without foreign body of left elbow, subsequent encounter: Secondary | ICD-10-CM | POA: Diagnosis not present

## 2017-06-01 DIAGNOSIS — Z7984 Long term (current) use of oral hypoglycemic drugs: Secondary | ICD-10-CM | POA: Diagnosis not present

## 2017-06-01 DIAGNOSIS — J9611 Chronic respiratory failure with hypoxia: Secondary | ICD-10-CM | POA: Diagnosis not present

## 2017-06-01 DIAGNOSIS — E1122 Type 2 diabetes mellitus with diabetic chronic kidney disease: Secondary | ICD-10-CM | POA: Diagnosis not present

## 2017-06-01 DIAGNOSIS — Z9181 History of falling: Secondary | ICD-10-CM | POA: Diagnosis not present

## 2017-06-01 DIAGNOSIS — E785 Hyperlipidemia, unspecified: Secondary | ICD-10-CM | POA: Diagnosis not present

## 2017-06-01 DIAGNOSIS — Z6822 Body mass index (BMI) 22.0-22.9, adult: Secondary | ICD-10-CM | POA: Diagnosis not present

## 2017-06-01 DIAGNOSIS — Z9981 Dependence on supplemental oxygen: Secondary | ICD-10-CM | POA: Diagnosis not present

## 2017-06-01 DIAGNOSIS — Z7902 Long term (current) use of antithrombotics/antiplatelets: Secondary | ICD-10-CM | POA: Diagnosis not present

## 2017-06-01 DIAGNOSIS — Z947 Corneal transplant status: Secondary | ICD-10-CM | POA: Diagnosis not present

## 2017-06-01 DIAGNOSIS — N183 Chronic kidney disease, stage 3 (moderate): Secondary | ICD-10-CM | POA: Diagnosis not present

## 2017-06-01 DIAGNOSIS — I25700 Atherosclerosis of coronary artery bypass graft(s), unspecified, with unstable angina pectoris: Secondary | ICD-10-CM | POA: Diagnosis not present

## 2017-06-01 DIAGNOSIS — E1121 Type 2 diabetes mellitus with diabetic nephropathy: Secondary | ICD-10-CM | POA: Diagnosis not present

## 2017-06-01 DIAGNOSIS — I5032 Chronic diastolic (congestive) heart failure: Secondary | ICD-10-CM | POA: Diagnosis not present

## 2017-06-01 DIAGNOSIS — Z7901 Long term (current) use of anticoagulants: Secondary | ICD-10-CM | POA: Diagnosis not present

## 2017-06-02 DIAGNOSIS — N183 Chronic kidney disease, stage 3 (moderate): Secondary | ICD-10-CM | POA: Diagnosis not present

## 2017-06-02 DIAGNOSIS — I130 Hypertensive heart and chronic kidney disease with heart failure and stage 1 through stage 4 chronic kidney disease, or unspecified chronic kidney disease: Secondary | ICD-10-CM | POA: Diagnosis not present

## 2017-06-02 DIAGNOSIS — E1122 Type 2 diabetes mellitus with diabetic chronic kidney disease: Secondary | ICD-10-CM | POA: Diagnosis not present

## 2017-06-02 DIAGNOSIS — I5032 Chronic diastolic (congestive) heart failure: Secondary | ICD-10-CM | POA: Diagnosis not present

## 2017-06-02 DIAGNOSIS — I25700 Atherosclerosis of coronary artery bypass graft(s), unspecified, with unstable angina pectoris: Secondary | ICD-10-CM | POA: Diagnosis not present

## 2017-06-02 DIAGNOSIS — E1121 Type 2 diabetes mellitus with diabetic nephropathy: Secondary | ICD-10-CM | POA: Diagnosis not present

## 2017-06-03 DIAGNOSIS — I5032 Chronic diastolic (congestive) heart failure: Secondary | ICD-10-CM | POA: Diagnosis not present

## 2017-06-03 DIAGNOSIS — I130 Hypertensive heart and chronic kidney disease with heart failure and stage 1 through stage 4 chronic kidney disease, or unspecified chronic kidney disease: Secondary | ICD-10-CM | POA: Diagnosis not present

## 2017-06-03 DIAGNOSIS — E1121 Type 2 diabetes mellitus with diabetic nephropathy: Secondary | ICD-10-CM | POA: Diagnosis not present

## 2017-06-03 DIAGNOSIS — N183 Chronic kidney disease, stage 3 (moderate): Secondary | ICD-10-CM | POA: Diagnosis not present

## 2017-06-03 DIAGNOSIS — I25700 Atherosclerosis of coronary artery bypass graft(s), unspecified, with unstable angina pectoris: Secondary | ICD-10-CM | POA: Diagnosis not present

## 2017-06-03 DIAGNOSIS — E1122 Type 2 diabetes mellitus with diabetic chronic kidney disease: Secondary | ICD-10-CM | POA: Diagnosis not present

## 2017-06-06 ENCOUNTER — Other Ambulatory Visit: Payer: Self-pay | Admitting: Family Medicine

## 2017-06-06 DIAGNOSIS — I25700 Atherosclerosis of coronary artery bypass graft(s), unspecified, with unstable angina pectoris: Secondary | ICD-10-CM | POA: Diagnosis not present

## 2017-06-06 DIAGNOSIS — I5032 Chronic diastolic (congestive) heart failure: Secondary | ICD-10-CM | POA: Diagnosis not present

## 2017-06-06 DIAGNOSIS — I130 Hypertensive heart and chronic kidney disease with heart failure and stage 1 through stage 4 chronic kidney disease, or unspecified chronic kidney disease: Secondary | ICD-10-CM | POA: Diagnosis not present

## 2017-06-06 DIAGNOSIS — E1122 Type 2 diabetes mellitus with diabetic chronic kidney disease: Secondary | ICD-10-CM | POA: Diagnosis not present

## 2017-06-06 DIAGNOSIS — N183 Chronic kidney disease, stage 3 (moderate): Secondary | ICD-10-CM | POA: Diagnosis not present

## 2017-06-06 DIAGNOSIS — E1121 Type 2 diabetes mellitus with diabetic nephropathy: Secondary | ICD-10-CM | POA: Diagnosis not present

## 2017-06-07 DIAGNOSIS — E1121 Type 2 diabetes mellitus with diabetic nephropathy: Secondary | ICD-10-CM | POA: Diagnosis not present

## 2017-06-07 DIAGNOSIS — I130 Hypertensive heart and chronic kidney disease with heart failure and stage 1 through stage 4 chronic kidney disease, or unspecified chronic kidney disease: Secondary | ICD-10-CM | POA: Diagnosis not present

## 2017-06-07 DIAGNOSIS — E1122 Type 2 diabetes mellitus with diabetic chronic kidney disease: Secondary | ICD-10-CM | POA: Diagnosis not present

## 2017-06-07 DIAGNOSIS — I25700 Atherosclerosis of coronary artery bypass graft(s), unspecified, with unstable angina pectoris: Secondary | ICD-10-CM | POA: Diagnosis not present

## 2017-06-07 DIAGNOSIS — N183 Chronic kidney disease, stage 3 (moderate): Secondary | ICD-10-CM | POA: Diagnosis not present

## 2017-06-07 DIAGNOSIS — I5032 Chronic diastolic (congestive) heart failure: Secondary | ICD-10-CM | POA: Diagnosis not present

## 2017-06-08 DIAGNOSIS — N183 Chronic kidney disease, stage 3 (moderate): Secondary | ICD-10-CM | POA: Diagnosis not present

## 2017-06-08 DIAGNOSIS — I130 Hypertensive heart and chronic kidney disease with heart failure and stage 1 through stage 4 chronic kidney disease, or unspecified chronic kidney disease: Secondary | ICD-10-CM | POA: Diagnosis not present

## 2017-06-08 DIAGNOSIS — E1122 Type 2 diabetes mellitus with diabetic chronic kidney disease: Secondary | ICD-10-CM | POA: Diagnosis not present

## 2017-06-08 DIAGNOSIS — I5032 Chronic diastolic (congestive) heart failure: Secondary | ICD-10-CM | POA: Diagnosis not present

## 2017-06-08 DIAGNOSIS — I25700 Atherosclerosis of coronary artery bypass graft(s), unspecified, with unstable angina pectoris: Secondary | ICD-10-CM | POA: Diagnosis not present

## 2017-06-08 DIAGNOSIS — E1121 Type 2 diabetes mellitus with diabetic nephropathy: Secondary | ICD-10-CM | POA: Diagnosis not present

## 2017-06-09 DIAGNOSIS — I25700 Atherosclerosis of coronary artery bypass graft(s), unspecified, with unstable angina pectoris: Secondary | ICD-10-CM | POA: Diagnosis not present

## 2017-06-09 DIAGNOSIS — N183 Chronic kidney disease, stage 3 (moderate): Secondary | ICD-10-CM | POA: Diagnosis not present

## 2017-06-09 DIAGNOSIS — E1122 Type 2 diabetes mellitus with diabetic chronic kidney disease: Secondary | ICD-10-CM | POA: Diagnosis not present

## 2017-06-09 DIAGNOSIS — I130 Hypertensive heart and chronic kidney disease with heart failure and stage 1 through stage 4 chronic kidney disease, or unspecified chronic kidney disease: Secondary | ICD-10-CM | POA: Diagnosis not present

## 2017-06-09 DIAGNOSIS — E1121 Type 2 diabetes mellitus with diabetic nephropathy: Secondary | ICD-10-CM | POA: Diagnosis not present

## 2017-06-09 DIAGNOSIS — I5032 Chronic diastolic (congestive) heart failure: Secondary | ICD-10-CM | POA: Diagnosis not present

## 2017-06-10 DIAGNOSIS — N183 Chronic kidney disease, stage 3 (moderate): Secondary | ICD-10-CM | POA: Diagnosis not present

## 2017-06-10 DIAGNOSIS — E1121 Type 2 diabetes mellitus with diabetic nephropathy: Secondary | ICD-10-CM | POA: Diagnosis not present

## 2017-06-10 DIAGNOSIS — I130 Hypertensive heart and chronic kidney disease with heart failure and stage 1 through stage 4 chronic kidney disease, or unspecified chronic kidney disease: Secondary | ICD-10-CM | POA: Diagnosis not present

## 2017-06-10 DIAGNOSIS — E1122 Type 2 diabetes mellitus with diabetic chronic kidney disease: Secondary | ICD-10-CM | POA: Diagnosis not present

## 2017-06-10 DIAGNOSIS — I25700 Atherosclerosis of coronary artery bypass graft(s), unspecified, with unstable angina pectoris: Secondary | ICD-10-CM | POA: Diagnosis not present

## 2017-06-10 DIAGNOSIS — I5032 Chronic diastolic (congestive) heart failure: Secondary | ICD-10-CM | POA: Diagnosis not present

## 2017-06-13 DIAGNOSIS — I5032 Chronic diastolic (congestive) heart failure: Secondary | ICD-10-CM | POA: Diagnosis not present

## 2017-06-13 DIAGNOSIS — E1121 Type 2 diabetes mellitus with diabetic nephropathy: Secondary | ICD-10-CM | POA: Diagnosis not present

## 2017-06-13 DIAGNOSIS — I130 Hypertensive heart and chronic kidney disease with heart failure and stage 1 through stage 4 chronic kidney disease, or unspecified chronic kidney disease: Secondary | ICD-10-CM | POA: Diagnosis not present

## 2017-06-13 DIAGNOSIS — E1122 Type 2 diabetes mellitus with diabetic chronic kidney disease: Secondary | ICD-10-CM | POA: Diagnosis not present

## 2017-06-13 DIAGNOSIS — I25700 Atherosclerosis of coronary artery bypass graft(s), unspecified, with unstable angina pectoris: Secondary | ICD-10-CM | POA: Diagnosis not present

## 2017-06-13 DIAGNOSIS — N183 Chronic kidney disease, stage 3 (moderate): Secondary | ICD-10-CM | POA: Diagnosis not present

## 2017-06-14 DIAGNOSIS — N183 Chronic kidney disease, stage 3 (moderate): Secondary | ICD-10-CM | POA: Diagnosis not present

## 2017-06-14 DIAGNOSIS — I25700 Atherosclerosis of coronary artery bypass graft(s), unspecified, with unstable angina pectoris: Secondary | ICD-10-CM | POA: Diagnosis not present

## 2017-06-14 DIAGNOSIS — I130 Hypertensive heart and chronic kidney disease with heart failure and stage 1 through stage 4 chronic kidney disease, or unspecified chronic kidney disease: Secondary | ICD-10-CM | POA: Diagnosis not present

## 2017-06-14 DIAGNOSIS — E1121 Type 2 diabetes mellitus with diabetic nephropathy: Secondary | ICD-10-CM | POA: Diagnosis not present

## 2017-06-14 DIAGNOSIS — E1122 Type 2 diabetes mellitus with diabetic chronic kidney disease: Secondary | ICD-10-CM | POA: Diagnosis not present

## 2017-06-14 DIAGNOSIS — I5032 Chronic diastolic (congestive) heart failure: Secondary | ICD-10-CM | POA: Diagnosis not present

## 2017-06-15 DIAGNOSIS — I5032 Chronic diastolic (congestive) heart failure: Secondary | ICD-10-CM | POA: Diagnosis not present

## 2017-06-15 DIAGNOSIS — E1122 Type 2 diabetes mellitus with diabetic chronic kidney disease: Secondary | ICD-10-CM | POA: Diagnosis not present

## 2017-06-15 DIAGNOSIS — I130 Hypertensive heart and chronic kidney disease with heart failure and stage 1 through stage 4 chronic kidney disease, or unspecified chronic kidney disease: Secondary | ICD-10-CM | POA: Diagnosis not present

## 2017-06-15 DIAGNOSIS — E1121 Type 2 diabetes mellitus with diabetic nephropathy: Secondary | ICD-10-CM | POA: Diagnosis not present

## 2017-06-15 DIAGNOSIS — N183 Chronic kidney disease, stage 3 (moderate): Secondary | ICD-10-CM | POA: Diagnosis not present

## 2017-06-15 DIAGNOSIS — I25700 Atherosclerosis of coronary artery bypass graft(s), unspecified, with unstable angina pectoris: Secondary | ICD-10-CM | POA: Diagnosis not present

## 2017-06-16 DIAGNOSIS — E1121 Type 2 diabetes mellitus with diabetic nephropathy: Secondary | ICD-10-CM | POA: Diagnosis not present

## 2017-06-16 DIAGNOSIS — N183 Chronic kidney disease, stage 3 (moderate): Secondary | ICD-10-CM | POA: Diagnosis not present

## 2017-06-16 DIAGNOSIS — I130 Hypertensive heart and chronic kidney disease with heart failure and stage 1 through stage 4 chronic kidney disease, or unspecified chronic kidney disease: Secondary | ICD-10-CM | POA: Diagnosis not present

## 2017-06-16 DIAGNOSIS — I5032 Chronic diastolic (congestive) heart failure: Secondary | ICD-10-CM | POA: Diagnosis not present

## 2017-06-16 DIAGNOSIS — E1122 Type 2 diabetes mellitus with diabetic chronic kidney disease: Secondary | ICD-10-CM | POA: Diagnosis not present

## 2017-06-16 DIAGNOSIS — I25700 Atherosclerosis of coronary artery bypass graft(s), unspecified, with unstable angina pectoris: Secondary | ICD-10-CM | POA: Diagnosis not present

## 2017-06-17 ENCOUNTER — Telehealth: Payer: Self-pay | Admitting: Family Medicine

## 2017-06-17 DIAGNOSIS — N183 Chronic kidney disease, stage 3 (moderate): Secondary | ICD-10-CM | POA: Diagnosis not present

## 2017-06-17 DIAGNOSIS — I25700 Atherosclerosis of coronary artery bypass graft(s), unspecified, with unstable angina pectoris: Secondary | ICD-10-CM | POA: Diagnosis not present

## 2017-06-17 DIAGNOSIS — E1121 Type 2 diabetes mellitus with diabetic nephropathy: Secondary | ICD-10-CM | POA: Diagnosis not present

## 2017-06-17 DIAGNOSIS — E1122 Type 2 diabetes mellitus with diabetic chronic kidney disease: Secondary | ICD-10-CM | POA: Diagnosis not present

## 2017-06-17 DIAGNOSIS — I5032 Chronic diastolic (congestive) heart failure: Secondary | ICD-10-CM | POA: Diagnosis not present

## 2017-06-17 DIAGNOSIS — I130 Hypertensive heart and chronic kidney disease with heart failure and stage 1 through stage 4 chronic kidney disease, or unspecified chronic kidney disease: Secondary | ICD-10-CM | POA: Diagnosis not present

## 2017-06-17 NOTE — Telephone Encounter (Signed)
Copied from [place] [phone]. Topic: Quick Communication - See Telephone Encounter >> Jun 17, 2017  4:54 PM Arletha Grippe wrote: CRM for notification. See Telephone encounter for: 06/17/17  [name] with hospice called - they need to have ativan 0.5 mg rx sent to [place] - fax is [phone]  Cb for stacey is [phone] Pt has 3 pills left

## 2017-06-20 DIAGNOSIS — N183 Chronic kidney disease, stage 3 (moderate): Secondary | ICD-10-CM | POA: Diagnosis not present

## 2017-06-20 DIAGNOSIS — I130 Hypertensive heart and chronic kidney disease with heart failure and stage 1 through stage 4 chronic kidney disease, or unspecified chronic kidney disease: Secondary | ICD-10-CM | POA: Diagnosis not present

## 2017-06-20 DIAGNOSIS — E1121 Type 2 diabetes mellitus with diabetic nephropathy: Secondary | ICD-10-CM | POA: Diagnosis not present

## 2017-06-20 DIAGNOSIS — E1122 Type 2 diabetes mellitus with diabetic chronic kidney disease: Secondary | ICD-10-CM | POA: Diagnosis not present

## 2017-06-20 DIAGNOSIS — I25700 Atherosclerosis of coronary artery bypass graft(s), unspecified, with unstable angina pectoris: Secondary | ICD-10-CM | POA: Diagnosis not present

## 2017-06-20 DIAGNOSIS — I5032 Chronic diastolic (congestive) heart failure: Secondary | ICD-10-CM | POA: Diagnosis not present

## 2017-06-20 NOTE — Telephone Encounter (Signed)
Request for 0.5 mg Ativan patient uses at noon     Last filled : 05/02/17  #30  LOV: 05/13/17  PCP: [place]: verified

## 2017-06-20 NOTE — Telephone Encounter (Signed)
Unable to reach [place] with hospice by phone; I called [facility] and spoke with Andee Poles; pt has available refills on ativan 0.5 mg and a refill was sent to [facility] facility for pt on 06/17/17 . I spoke with Dewaine Oats at [facility] and pt does have Ativan 0.5 mg medication available;left v/m for [place] with this info. Nothing further needed.

## 2017-06-21 DIAGNOSIS — I25700 Atherosclerosis of coronary artery bypass graft(s), unspecified, with unstable angina pectoris: Secondary | ICD-10-CM | POA: Diagnosis not present

## 2017-06-21 DIAGNOSIS — E1122 Type 2 diabetes mellitus with diabetic chronic kidney disease: Secondary | ICD-10-CM | POA: Diagnosis not present

## 2017-06-21 DIAGNOSIS — I5032 Chronic diastolic (congestive) heart failure: Secondary | ICD-10-CM | POA: Diagnosis not present

## 2017-06-21 DIAGNOSIS — I130 Hypertensive heart and chronic kidney disease with heart failure and stage 1 through stage 4 chronic kidney disease, or unspecified chronic kidney disease: Secondary | ICD-10-CM | POA: Diagnosis not present

## 2017-06-21 DIAGNOSIS — E1121 Type 2 diabetes mellitus with diabetic nephropathy: Secondary | ICD-10-CM | POA: Diagnosis not present

## 2017-06-21 DIAGNOSIS — N183 Chronic kidney disease, stage 3 (moderate): Secondary | ICD-10-CM | POA: Diagnosis not present

## 2017-06-22 ENCOUNTER — Telehealth: Payer: Self-pay

## 2017-06-22 DIAGNOSIS — I25700 Atherosclerosis of coronary artery bypass graft(s), unspecified, with unstable angina pectoris: Secondary | ICD-10-CM | POA: Diagnosis not present

## 2017-06-22 DIAGNOSIS — I5032 Chronic diastolic (congestive) heart failure: Secondary | ICD-10-CM | POA: Diagnosis not present

## 2017-06-22 DIAGNOSIS — I130 Hypertensive heart and chronic kidney disease with heart failure and stage 1 through stage 4 chronic kidney disease, or unspecified chronic kidney disease: Secondary | ICD-10-CM | POA: Diagnosis not present

## 2017-06-22 DIAGNOSIS — E1122 Type 2 diabetes mellitus with diabetic chronic kidney disease: Secondary | ICD-10-CM | POA: Diagnosis not present

## 2017-06-22 DIAGNOSIS — E1121 Type 2 diabetes mellitus with diabetic nephropathy: Secondary | ICD-10-CM | POA: Diagnosis not present

## 2017-06-22 DIAGNOSIS — N183 Chronic kidney disease, stage 3 (moderate): Secondary | ICD-10-CM | POA: Diagnosis not present

## 2017-06-22 NOTE — Telephone Encounter (Signed)
Copied from [place]. Topic: General - Other >> Jun 22, 2017  1:34 PM Yvette Rack wrote: Reason for CRM: Stacy with [facility] states pt has been experiencing nausea for last several days. Marzetta Board states the pt may have a UTI and wanted to know if they should collect a clean urine catch. Requests call back at [phone].

## 2017-06-22 NOTE — Telephone Encounter (Signed)
any fever, dysuria, increased urgency or flank or back pain?  Ok to do UA and culture if abnormal.

## 2017-06-23 DIAGNOSIS — E1122 Type 2 diabetes mellitus with diabetic chronic kidney disease: Secondary | ICD-10-CM | POA: Diagnosis not present

## 2017-06-23 DIAGNOSIS — I25700 Atherosclerosis of coronary artery bypass graft(s), unspecified, with unstable angina pectoris: Secondary | ICD-10-CM | POA: Diagnosis not present

## 2017-06-23 DIAGNOSIS — N183 Chronic kidney disease, stage 3 (moderate): Secondary | ICD-10-CM | POA: Diagnosis not present

## 2017-06-23 DIAGNOSIS — E1121 Type 2 diabetes mellitus with diabetic nephropathy: Secondary | ICD-10-CM | POA: Diagnosis not present

## 2017-06-23 DIAGNOSIS — I130 Hypertensive heart and chronic kidney disease with heart failure and stage 1 through stage 4 chronic kidney disease, or unspecified chronic kidney disease: Secondary | ICD-10-CM | POA: Diagnosis not present

## 2017-06-23 DIAGNOSIS — I5032 Chronic diastolic (congestive) heart failure: Secondary | ICD-10-CM | POA: Diagnosis not present

## 2017-06-23 NOTE — Telephone Encounter (Signed)
Spoke with [name] asking if pt has any sxs.  Says pt has not c/o any fever, dysuria, increased urgency or back pain. I relayed Dr. Synthia Innocent message. Stacey verbalizes understanding.

## 2017-06-24 ENCOUNTER — Other Ambulatory Visit: Payer: Self-pay | Admitting: Family Medicine

## 2017-06-24 DIAGNOSIS — I130 Hypertensive heart and chronic kidney disease with heart failure and stage 1 through stage 4 chronic kidney disease, or unspecified chronic kidney disease: Secondary | ICD-10-CM | POA: Diagnosis not present

## 2017-06-24 DIAGNOSIS — E1122 Type 2 diabetes mellitus with diabetic chronic kidney disease: Secondary | ICD-10-CM | POA: Diagnosis not present

## 2017-06-24 DIAGNOSIS — I25700 Atherosclerosis of coronary artery bypass graft(s), unspecified, with unstable angina pectoris: Secondary | ICD-10-CM | POA: Diagnosis not present

## 2017-06-24 DIAGNOSIS — E1121 Type 2 diabetes mellitus with diabetic nephropathy: Secondary | ICD-10-CM | POA: Diagnosis not present

## 2017-06-24 DIAGNOSIS — N183 Chronic kidney disease, stage 3 (moderate): Secondary | ICD-10-CM | POA: Diagnosis not present

## 2017-06-24 DIAGNOSIS — I5032 Chronic diastolic (congestive) heart failure: Secondary | ICD-10-CM | POA: Diagnosis not present

## 2017-06-24 MED ORDER — CEPHALEXIN 500 MG PO CAPS: 500.0000 mg | ORAL_CAPSULE | Freq: Two times a day (BID) | ORAL | 0 refills | Status: DC

## 2017-06-24 NOTE — Telephone Encounter (Signed)
Spoke with pt informing her the urine shows some signs of infection but we are still waiting for the results of the culture.  So in the meantime, Dr. Darnell Level has sent abx for her to take and he wants her to call back next week to let him know how she is doing.  Pt verbalizes understanding.

## 2017-06-24 NOTE — Telephone Encounter (Signed)
received UA results from [facility] - suspicious for infection with 3+ LE and nitrites. Awaiting urine culture.  In interim, please start keflex antibiotic sent to [facility] and update [place] with symptoms next week.

## 2017-06-26 ENCOUNTER — Other Ambulatory Visit: Payer: Self-pay | Admitting: Family Medicine

## 2017-06-27 DIAGNOSIS — E1122 Type 2 diabetes mellitus with diabetic chronic kidney disease: Secondary | ICD-10-CM | POA: Diagnosis not present

## 2017-06-27 DIAGNOSIS — E1121 Type 2 diabetes mellitus with diabetic nephropathy: Secondary | ICD-10-CM | POA: Diagnosis not present

## 2017-06-27 DIAGNOSIS — I5032 Chronic diastolic (congestive) heart failure: Secondary | ICD-10-CM | POA: Diagnosis not present

## 2017-06-27 DIAGNOSIS — I130 Hypertensive heart and chronic kidney disease with heart failure and stage 1 through stage 4 chronic kidney disease, or unspecified chronic kidney disease: Secondary | ICD-10-CM | POA: Diagnosis not present

## 2017-06-27 DIAGNOSIS — N183 Chronic kidney disease, stage 3 (moderate): Secondary | ICD-10-CM | POA: Diagnosis not present

## 2017-06-27 DIAGNOSIS — I25700 Atherosclerosis of coronary artery bypass graft(s), unspecified, with unstable angina pectoris: Secondary | ICD-10-CM | POA: Diagnosis not present

## 2017-06-28 DIAGNOSIS — I5032 Chronic diastolic (congestive) heart failure: Secondary | ICD-10-CM | POA: Diagnosis not present

## 2017-06-28 DIAGNOSIS — E1122 Type 2 diabetes mellitus with diabetic chronic kidney disease: Secondary | ICD-10-CM | POA: Diagnosis not present

## 2017-06-28 DIAGNOSIS — I130 Hypertensive heart and chronic kidney disease with heart failure and stage 1 through stage 4 chronic kidney disease, or unspecified chronic kidney disease: Secondary | ICD-10-CM | POA: Diagnosis not present

## 2017-06-28 DIAGNOSIS — N183 Chronic kidney disease, stage 3 (moderate): Secondary | ICD-10-CM | POA: Diagnosis not present

## 2017-06-28 DIAGNOSIS — I25700 Atherosclerosis of coronary artery bypass graft(s), unspecified, with unstable angina pectoris: Secondary | ICD-10-CM | POA: Diagnosis not present

## 2017-06-28 DIAGNOSIS — E1121 Type 2 diabetes mellitus with diabetic nephropathy: Secondary | ICD-10-CM | POA: Diagnosis not present

## 2017-06-28 NOTE — Telephone Encounter (Signed)
plz notify urine culture returned without growing definitive bacterial infection. rec finish abx and update [place] if persistent symptoms after treatment.

## 2017-06-29 ENCOUNTER — Encounter: Payer: Self-pay | Admitting: Family Medicine

## 2017-06-29 DIAGNOSIS — I5032 Chronic diastolic (congestive) heart failure: Secondary | ICD-10-CM | POA: Diagnosis not present

## 2017-06-29 DIAGNOSIS — E1122 Type 2 diabetes mellitus with diabetic chronic kidney disease: Secondary | ICD-10-CM | POA: Diagnosis not present

## 2017-06-29 DIAGNOSIS — N183 Chronic kidney disease, stage 3 (moderate): Secondary | ICD-10-CM | POA: Diagnosis not present

## 2017-06-29 DIAGNOSIS — I25700 Atherosclerosis of coronary artery bypass graft(s), unspecified, with unstable angina pectoris: Secondary | ICD-10-CM | POA: Diagnosis not present

## 2017-06-29 DIAGNOSIS — I130 Hypertensive heart and chronic kidney disease with heart failure and stage 1 through stage 4 chronic kidney disease, or unspecified chronic kidney disease: Secondary | ICD-10-CM | POA: Diagnosis not present

## 2017-06-29 DIAGNOSIS — E1121 Type 2 diabetes mellitus with diabetic nephropathy: Secondary | ICD-10-CM | POA: Diagnosis not present

## 2017-06-29 NOTE — Telephone Encounter (Signed)
Spoke with [name] relaying results and instructions for pt.  Verbalizes understanding.

## 2017-06-30 DIAGNOSIS — I5032 Chronic diastolic (congestive) heart failure: Secondary | ICD-10-CM | POA: Diagnosis not present

## 2017-06-30 DIAGNOSIS — N183 Chronic kidney disease, stage 3 (moderate): Secondary | ICD-10-CM | POA: Diagnosis not present

## 2017-06-30 DIAGNOSIS — I130 Hypertensive heart and chronic kidney disease with heart failure and stage 1 through stage 4 chronic kidney disease, or unspecified chronic kidney disease: Secondary | ICD-10-CM | POA: Diagnosis not present

## 2017-06-30 DIAGNOSIS — E1121 Type 2 diabetes mellitus with diabetic nephropathy: Secondary | ICD-10-CM | POA: Diagnosis not present

## 2017-06-30 DIAGNOSIS — E1122 Type 2 diabetes mellitus with diabetic chronic kidney disease: Secondary | ICD-10-CM | POA: Diagnosis not present

## 2017-06-30 DIAGNOSIS — I25700 Atherosclerosis of coronary artery bypass graft(s), unspecified, with unstable angina pectoris: Secondary | ICD-10-CM | POA: Diagnosis not present

## 2017-07-01 DIAGNOSIS — N183 Chronic kidney disease, stage 3 (moderate): Secondary | ICD-10-CM | POA: Diagnosis not present

## 2017-07-01 DIAGNOSIS — E1122 Type 2 diabetes mellitus with diabetic chronic kidney disease: Secondary | ICD-10-CM | POA: Diagnosis not present

## 2017-07-01 DIAGNOSIS — I5032 Chronic diastolic (congestive) heart failure: Secondary | ICD-10-CM | POA: Diagnosis not present

## 2017-07-01 DIAGNOSIS — E1121 Type 2 diabetes mellitus with diabetic nephropathy: Secondary | ICD-10-CM | POA: Diagnosis not present

## 2017-07-01 DIAGNOSIS — I130 Hypertensive heart and chronic kidney disease with heart failure and stage 1 through stage 4 chronic kidney disease, or unspecified chronic kidney disease: Secondary | ICD-10-CM | POA: Diagnosis not present

## 2017-07-01 DIAGNOSIS — I25700 Atherosclerosis of coronary artery bypass graft(s), unspecified, with unstable angina pectoris: Secondary | ICD-10-CM | POA: Diagnosis not present

## 2017-07-02 DIAGNOSIS — Z6822 Body mass index (BMI) 22.0-22.9, adult: Secondary | ICD-10-CM | POA: Diagnosis not present

## 2017-07-02 DIAGNOSIS — N183 Chronic kidney disease, stage 3 (moderate): Secondary | ICD-10-CM | POA: Diagnosis not present

## 2017-07-02 DIAGNOSIS — Z7902 Long term (current) use of antithrombotics/antiplatelets: Secondary | ICD-10-CM | POA: Diagnosis not present

## 2017-07-02 DIAGNOSIS — I130 Hypertensive heart and chronic kidney disease with heart failure and stage 1 through stage 4 chronic kidney disease, or unspecified chronic kidney disease: Secondary | ICD-10-CM | POA: Diagnosis not present

## 2017-07-02 DIAGNOSIS — J9611 Chronic respiratory failure with hypoxia: Secondary | ICD-10-CM | POA: Diagnosis not present

## 2017-07-02 DIAGNOSIS — E785 Hyperlipidemia, unspecified: Secondary | ICD-10-CM | POA: Diagnosis not present

## 2017-07-02 DIAGNOSIS — Z741 Need for assistance with personal care: Secondary | ICD-10-CM | POA: Diagnosis not present

## 2017-07-02 DIAGNOSIS — E1121 Type 2 diabetes mellitus with diabetic nephropathy: Secondary | ICD-10-CM | POA: Diagnosis not present

## 2017-07-02 DIAGNOSIS — I5032 Chronic diastolic (congestive) heart failure: Secondary | ICD-10-CM | POA: Diagnosis not present

## 2017-07-02 DIAGNOSIS — Z9981 Dependence on supplemental oxygen: Secondary | ICD-10-CM | POA: Diagnosis not present

## 2017-07-02 DIAGNOSIS — Z9181 History of falling: Secondary | ICD-10-CM | POA: Diagnosis not present

## 2017-07-02 DIAGNOSIS — I25700 Atherosclerosis of coronary artery bypass graft(s), unspecified, with unstable angina pectoris: Secondary | ICD-10-CM | POA: Diagnosis not present

## 2017-07-02 DIAGNOSIS — R32 Unspecified urinary incontinence: Secondary | ICD-10-CM | POA: Diagnosis not present

## 2017-07-02 DIAGNOSIS — N390 Urinary tract infection, site not specified: Secondary | ICD-10-CM | POA: Diagnosis not present

## 2017-07-02 DIAGNOSIS — E1122 Type 2 diabetes mellitus with diabetic chronic kidney disease: Secondary | ICD-10-CM | POA: Diagnosis not present

## 2017-07-02 DIAGNOSIS — Z7901 Long term (current) use of anticoagulants: Secondary | ICD-10-CM | POA: Diagnosis not present

## 2017-07-02 DIAGNOSIS — Z7984 Long term (current) use of oral hypoglycemic drugs: Secondary | ICD-10-CM | POA: Diagnosis not present

## 2017-07-04 DIAGNOSIS — N183 Chronic kidney disease, stage 3 (moderate): Secondary | ICD-10-CM | POA: Diagnosis not present

## 2017-07-04 DIAGNOSIS — E1122 Type 2 diabetes mellitus with diabetic chronic kidney disease: Secondary | ICD-10-CM | POA: Diagnosis not present

## 2017-07-04 DIAGNOSIS — E1121 Type 2 diabetes mellitus with diabetic nephropathy: Secondary | ICD-10-CM | POA: Diagnosis not present

## 2017-07-04 DIAGNOSIS — I130 Hypertensive heart and chronic kidney disease with heart failure and stage 1 through stage 4 chronic kidney disease, or unspecified chronic kidney disease: Secondary | ICD-10-CM | POA: Diagnosis not present

## 2017-07-04 DIAGNOSIS — I5032 Chronic diastolic (congestive) heart failure: Secondary | ICD-10-CM | POA: Diagnosis not present

## 2017-07-04 DIAGNOSIS — I25700 Atherosclerosis of coronary artery bypass graft(s), unspecified, with unstable angina pectoris: Secondary | ICD-10-CM | POA: Diagnosis not present

## 2017-07-05 DIAGNOSIS — E1121 Type 2 diabetes mellitus with diabetic nephropathy: Secondary | ICD-10-CM | POA: Diagnosis not present

## 2017-07-05 DIAGNOSIS — I5032 Chronic diastolic (congestive) heart failure: Secondary | ICD-10-CM | POA: Diagnosis not present

## 2017-07-05 DIAGNOSIS — I25700 Atherosclerosis of coronary artery bypass graft(s), unspecified, with unstable angina pectoris: Secondary | ICD-10-CM | POA: Diagnosis not present

## 2017-07-05 DIAGNOSIS — E1122 Type 2 diabetes mellitus with diabetic chronic kidney disease: Secondary | ICD-10-CM | POA: Diagnosis not present

## 2017-07-05 DIAGNOSIS — I130 Hypertensive heart and chronic kidney disease with heart failure and stage 1 through stage 4 chronic kidney disease, or unspecified chronic kidney disease: Secondary | ICD-10-CM | POA: Diagnosis not present

## 2017-07-05 DIAGNOSIS — N183 Chronic kidney disease, stage 3 (moderate): Secondary | ICD-10-CM | POA: Diagnosis not present

## 2017-07-06 DIAGNOSIS — E1122 Type 2 diabetes mellitus with diabetic chronic kidney disease: Secondary | ICD-10-CM | POA: Diagnosis not present

## 2017-07-06 DIAGNOSIS — I130 Hypertensive heart and chronic kidney disease with heart failure and stage 1 through stage 4 chronic kidney disease, or unspecified chronic kidney disease: Secondary | ICD-10-CM | POA: Diagnosis not present

## 2017-07-06 DIAGNOSIS — I25700 Atherosclerosis of coronary artery bypass graft(s), unspecified, with unstable angina pectoris: Secondary | ICD-10-CM | POA: Diagnosis not present

## 2017-07-06 DIAGNOSIS — N183 Chronic kidney disease, stage 3 (moderate): Secondary | ICD-10-CM | POA: Diagnosis not present

## 2017-07-06 DIAGNOSIS — E1121 Type 2 diabetes mellitus with diabetic nephropathy: Secondary | ICD-10-CM | POA: Diagnosis not present

## 2017-07-06 DIAGNOSIS — I5032 Chronic diastolic (congestive) heart failure: Secondary | ICD-10-CM | POA: Diagnosis not present

## 2017-07-07 DIAGNOSIS — I5032 Chronic diastolic (congestive) heart failure: Secondary | ICD-10-CM | POA: Diagnosis not present

## 2017-07-07 DIAGNOSIS — I25700 Atherosclerosis of coronary artery bypass graft(s), unspecified, with unstable angina pectoris: Secondary | ICD-10-CM | POA: Diagnosis not present

## 2017-07-07 DIAGNOSIS — E1121 Type 2 diabetes mellitus with diabetic nephropathy: Secondary | ICD-10-CM | POA: Diagnosis not present

## 2017-07-07 DIAGNOSIS — I130 Hypertensive heart and chronic kidney disease with heart failure and stage 1 through stage 4 chronic kidney disease, or unspecified chronic kidney disease: Secondary | ICD-10-CM | POA: Diagnosis not present

## 2017-07-07 DIAGNOSIS — N183 Chronic kidney disease, stage 3 (moderate): Secondary | ICD-10-CM | POA: Diagnosis not present

## 2017-07-07 DIAGNOSIS — E1122 Type 2 diabetes mellitus with diabetic chronic kidney disease: Secondary | ICD-10-CM | POA: Diagnosis not present

## 2017-07-08 DIAGNOSIS — E1122 Type 2 diabetes mellitus with diabetic chronic kidney disease: Secondary | ICD-10-CM | POA: Diagnosis not present

## 2017-07-08 DIAGNOSIS — I130 Hypertensive heart and chronic kidney disease with heart failure and stage 1 through stage 4 chronic kidney disease, or unspecified chronic kidney disease: Secondary | ICD-10-CM | POA: Diagnosis not present

## 2017-07-08 DIAGNOSIS — N183 Chronic kidney disease, stage 3 (moderate): Secondary | ICD-10-CM | POA: Diagnosis not present

## 2017-07-08 DIAGNOSIS — E1121 Type 2 diabetes mellitus with diabetic nephropathy: Secondary | ICD-10-CM | POA: Diagnosis not present

## 2017-07-08 DIAGNOSIS — I25700 Atherosclerosis of coronary artery bypass graft(s), unspecified, with unstable angina pectoris: Secondary | ICD-10-CM | POA: Diagnosis not present

## 2017-07-08 DIAGNOSIS — I5032 Chronic diastolic (congestive) heart failure: Secondary | ICD-10-CM | POA: Diagnosis not present

## 2017-07-11 ENCOUNTER — Other Ambulatory Visit: Payer: Self-pay | Admitting: Family Medicine

## 2017-07-11 DIAGNOSIS — N183 Chronic kidney disease, stage 3 (moderate): Secondary | ICD-10-CM | POA: Diagnosis not present

## 2017-07-11 DIAGNOSIS — E1121 Type 2 diabetes mellitus with diabetic nephropathy: Secondary | ICD-10-CM | POA: Diagnosis not present

## 2017-07-11 DIAGNOSIS — I25700 Atherosclerosis of coronary artery bypass graft(s), unspecified, with unstable angina pectoris: Secondary | ICD-10-CM | POA: Diagnosis not present

## 2017-07-11 DIAGNOSIS — I130 Hypertensive heart and chronic kidney disease with heart failure and stage 1 through stage 4 chronic kidney disease, or unspecified chronic kidney disease: Secondary | ICD-10-CM | POA: Diagnosis not present

## 2017-07-11 DIAGNOSIS — E1122 Type 2 diabetes mellitus with diabetic chronic kidney disease: Secondary | ICD-10-CM | POA: Diagnosis not present

## 2017-07-11 DIAGNOSIS — I5032 Chronic diastolic (congestive) heart failure: Secondary | ICD-10-CM | POA: Diagnosis not present

## 2017-07-12 DIAGNOSIS — I25700 Atherosclerosis of coronary artery bypass graft(s), unspecified, with unstable angina pectoris: Secondary | ICD-10-CM | POA: Diagnosis not present

## 2017-07-12 DIAGNOSIS — I5032 Chronic diastolic (congestive) heart failure: Secondary | ICD-10-CM | POA: Diagnosis not present

## 2017-07-12 DIAGNOSIS — I130 Hypertensive heart and chronic kidney disease with heart failure and stage 1 through stage 4 chronic kidney disease, or unspecified chronic kidney disease: Secondary | ICD-10-CM | POA: Diagnosis not present

## 2017-07-12 DIAGNOSIS — E1122 Type 2 diabetes mellitus with diabetic chronic kidney disease: Secondary | ICD-10-CM | POA: Diagnosis not present

## 2017-07-12 DIAGNOSIS — E1121 Type 2 diabetes mellitus with diabetic nephropathy: Secondary | ICD-10-CM | POA: Diagnosis not present

## 2017-07-12 DIAGNOSIS — N183 Chronic kidney disease, stage 3 (moderate): Secondary | ICD-10-CM | POA: Diagnosis not present

## 2017-07-13 DIAGNOSIS — I130 Hypertensive heart and chronic kidney disease with heart failure and stage 1 through stage 4 chronic kidney disease, or unspecified chronic kidney disease: Secondary | ICD-10-CM | POA: Diagnosis not present

## 2017-07-13 DIAGNOSIS — E1122 Type 2 diabetes mellitus with diabetic chronic kidney disease: Secondary | ICD-10-CM | POA: Diagnosis not present

## 2017-07-13 DIAGNOSIS — N183 Chronic kidney disease, stage 3 (moderate): Secondary | ICD-10-CM | POA: Diagnosis not present

## 2017-07-13 DIAGNOSIS — I5032 Chronic diastolic (congestive) heart failure: Secondary | ICD-10-CM | POA: Diagnosis not present

## 2017-07-13 DIAGNOSIS — E1121 Type 2 diabetes mellitus with diabetic nephropathy: Secondary | ICD-10-CM | POA: Diagnosis not present

## 2017-07-13 DIAGNOSIS — I25700 Atherosclerosis of coronary artery bypass graft(s), unspecified, with unstable angina pectoris: Secondary | ICD-10-CM | POA: Diagnosis not present

## 2017-07-14 DIAGNOSIS — I25700 Atherosclerosis of coronary artery bypass graft(s), unspecified, with unstable angina pectoris: Secondary | ICD-10-CM | POA: Diagnosis not present

## 2017-07-14 DIAGNOSIS — N183 Chronic kidney disease, stage 3 (moderate): Secondary | ICD-10-CM | POA: Diagnosis not present

## 2017-07-14 DIAGNOSIS — I130 Hypertensive heart and chronic kidney disease with heart failure and stage 1 through stage 4 chronic kidney disease, or unspecified chronic kidney disease: Secondary | ICD-10-CM | POA: Diagnosis not present

## 2017-07-14 DIAGNOSIS — E1121 Type 2 diabetes mellitus with diabetic nephropathy: Secondary | ICD-10-CM | POA: Diagnosis not present

## 2017-07-14 DIAGNOSIS — I5032 Chronic diastolic (congestive) heart failure: Secondary | ICD-10-CM | POA: Diagnosis not present

## 2017-07-14 DIAGNOSIS — E1122 Type 2 diabetes mellitus with diabetic chronic kidney disease: Secondary | ICD-10-CM | POA: Diagnosis not present

## 2017-07-15 ENCOUNTER — Other Ambulatory Visit: Payer: Self-pay

## 2017-07-15 DIAGNOSIS — I25700 Atherosclerosis of coronary artery bypass graft(s), unspecified, with unstable angina pectoris: Secondary | ICD-10-CM | POA: Diagnosis not present

## 2017-07-15 DIAGNOSIS — E1122 Type 2 diabetes mellitus with diabetic chronic kidney disease: Secondary | ICD-10-CM | POA: Diagnosis not present

## 2017-07-15 DIAGNOSIS — I5032 Chronic diastolic (congestive) heart failure: Secondary | ICD-10-CM | POA: Diagnosis not present

## 2017-07-15 DIAGNOSIS — I130 Hypertensive heart and chronic kidney disease with heart failure and stage 1 through stage 4 chronic kidney disease, or unspecified chronic kidney disease: Secondary | ICD-10-CM | POA: Diagnosis not present

## 2017-07-15 DIAGNOSIS — N183 Chronic kidney disease, stage 3 (moderate): Secondary | ICD-10-CM | POA: Diagnosis not present

## 2017-07-15 DIAGNOSIS — E1121 Type 2 diabetes mellitus with diabetic nephropathy: Secondary | ICD-10-CM | POA: Diagnosis not present

## 2017-07-15 NOTE — Telephone Encounter (Signed)
Last rx:  05/02/17, #30 Last OV:  05/13/17 Next OV:  09/14/17

## 2017-07-15 NOTE — Telephone Encounter (Signed)
Stacy RN with [facility] called to ck on refill for lorazepam 0.5 mg; med tech from [facility] had advised Stacy pt needed refill for lorazepam next week. I called [facility] and spoke with Wynell Balloon who spoke with Jonni Sanger in order entry; pt already has available refills on both lorazepam 0.5 mg and 1 mg. Jonni Sanger said was sent out on 07/05/17 so will have to wait for next refill. Stacy RN was notified and voiced understanding and she will contact [facility] with info. Both lorazepam 0.5 mg and 1 mg was sent electronically to [facility] # 30 x 3 on 05/02/17. FYI to Dr Danise Mina.

## 2017-07-18 DIAGNOSIS — I5032 Chronic diastolic (congestive) heart failure: Secondary | ICD-10-CM | POA: Diagnosis not present

## 2017-07-18 DIAGNOSIS — I25700 Atherosclerosis of coronary artery bypass graft(s), unspecified, with unstable angina pectoris: Secondary | ICD-10-CM | POA: Diagnosis not present

## 2017-07-18 DIAGNOSIS — N183 Chronic kidney disease, stage 3 (moderate): Secondary | ICD-10-CM | POA: Diagnosis not present

## 2017-07-18 DIAGNOSIS — E1121 Type 2 diabetes mellitus with diabetic nephropathy: Secondary | ICD-10-CM | POA: Diagnosis not present

## 2017-07-18 DIAGNOSIS — I130 Hypertensive heart and chronic kidney disease with heart failure and stage 1 through stage 4 chronic kidney disease, or unspecified chronic kidney disease: Secondary | ICD-10-CM | POA: Diagnosis not present

## 2017-07-18 DIAGNOSIS — E1122 Type 2 diabetes mellitus with diabetic chronic kidney disease: Secondary | ICD-10-CM | POA: Diagnosis not present

## 2017-07-19 DIAGNOSIS — I25700 Atherosclerosis of coronary artery bypass graft(s), unspecified, with unstable angina pectoris: Secondary | ICD-10-CM | POA: Diagnosis not present

## 2017-07-19 DIAGNOSIS — I130 Hypertensive heart and chronic kidney disease with heart failure and stage 1 through stage 4 chronic kidney disease, or unspecified chronic kidney disease: Secondary | ICD-10-CM | POA: Diagnosis not present

## 2017-07-19 DIAGNOSIS — I5032 Chronic diastolic (congestive) heart failure: Secondary | ICD-10-CM | POA: Diagnosis not present

## 2017-07-19 DIAGNOSIS — E1122 Type 2 diabetes mellitus with diabetic chronic kidney disease: Secondary | ICD-10-CM | POA: Diagnosis not present

## 2017-07-19 DIAGNOSIS — N183 Chronic kidney disease, stage 3 (moderate): Secondary | ICD-10-CM | POA: Diagnosis not present

## 2017-07-19 DIAGNOSIS — E1121 Type 2 diabetes mellitus with diabetic nephropathy: Secondary | ICD-10-CM | POA: Diagnosis not present

## 2017-07-20 DIAGNOSIS — I25700 Atherosclerosis of coronary artery bypass graft(s), unspecified, with unstable angina pectoris: Secondary | ICD-10-CM | POA: Diagnosis not present

## 2017-07-20 DIAGNOSIS — I5032 Chronic diastolic (congestive) heart failure: Secondary | ICD-10-CM | POA: Diagnosis not present

## 2017-07-20 DIAGNOSIS — E1122 Type 2 diabetes mellitus with diabetic chronic kidney disease: Secondary | ICD-10-CM | POA: Diagnosis not present

## 2017-07-20 DIAGNOSIS — I130 Hypertensive heart and chronic kidney disease with heart failure and stage 1 through stage 4 chronic kidney disease, or unspecified chronic kidney disease: Secondary | ICD-10-CM | POA: Diagnosis not present

## 2017-07-20 DIAGNOSIS — E1121 Type 2 diabetes mellitus with diabetic nephropathy: Secondary | ICD-10-CM | POA: Diagnosis not present

## 2017-07-20 DIAGNOSIS — N183 Chronic kidney disease, stage 3 (moderate): Secondary | ICD-10-CM | POA: Diagnosis not present

## 2017-07-21 DIAGNOSIS — I5032 Chronic diastolic (congestive) heart failure: Secondary | ICD-10-CM | POA: Diagnosis not present

## 2017-07-21 DIAGNOSIS — E1121 Type 2 diabetes mellitus with diabetic nephropathy: Secondary | ICD-10-CM | POA: Diagnosis not present

## 2017-07-21 DIAGNOSIS — E1122 Type 2 diabetes mellitus with diabetic chronic kidney disease: Secondary | ICD-10-CM | POA: Diagnosis not present

## 2017-07-21 DIAGNOSIS — I130 Hypertensive heart and chronic kidney disease with heart failure and stage 1 through stage 4 chronic kidney disease, or unspecified chronic kidney disease: Secondary | ICD-10-CM | POA: Diagnosis not present

## 2017-07-21 DIAGNOSIS — N183 Chronic kidney disease, stage 3 (moderate): Secondary | ICD-10-CM | POA: Diagnosis not present

## 2017-07-21 DIAGNOSIS — I25700 Atherosclerosis of coronary artery bypass graft(s), unspecified, with unstable angina pectoris: Secondary | ICD-10-CM | POA: Diagnosis not present

## 2017-07-22 DIAGNOSIS — I5032 Chronic diastolic (congestive) heart failure: Secondary | ICD-10-CM | POA: Diagnosis not present

## 2017-07-22 DIAGNOSIS — I25700 Atherosclerosis of coronary artery bypass graft(s), unspecified, with unstable angina pectoris: Secondary | ICD-10-CM | POA: Diagnosis not present

## 2017-07-22 DIAGNOSIS — I130 Hypertensive heart and chronic kidney disease with heart failure and stage 1 through stage 4 chronic kidney disease, or unspecified chronic kidney disease: Secondary | ICD-10-CM | POA: Diagnosis not present

## 2017-07-22 DIAGNOSIS — N183 Chronic kidney disease, stage 3 (moderate): Secondary | ICD-10-CM | POA: Diagnosis not present

## 2017-07-22 DIAGNOSIS — E1121 Type 2 diabetes mellitus with diabetic nephropathy: Secondary | ICD-10-CM | POA: Diagnosis not present

## 2017-07-22 DIAGNOSIS — E1122 Type 2 diabetes mellitus with diabetic chronic kidney disease: Secondary | ICD-10-CM | POA: Diagnosis not present

## 2017-07-24 DIAGNOSIS — E1121 Type 2 diabetes mellitus with diabetic nephropathy: Secondary | ICD-10-CM | POA: Diagnosis not present

## 2017-07-24 DIAGNOSIS — I130 Hypertensive heart and chronic kidney disease with heart failure and stage 1 through stage 4 chronic kidney disease, or unspecified chronic kidney disease: Secondary | ICD-10-CM | POA: Diagnosis not present

## 2017-07-24 DIAGNOSIS — I5032 Chronic diastolic (congestive) heart failure: Secondary | ICD-10-CM | POA: Diagnosis not present

## 2017-07-24 DIAGNOSIS — I25700 Atherosclerosis of coronary artery bypass graft(s), unspecified, with unstable angina pectoris: Secondary | ICD-10-CM | POA: Diagnosis not present

## 2017-07-24 DIAGNOSIS — E1122 Type 2 diabetes mellitus with diabetic chronic kidney disease: Secondary | ICD-10-CM | POA: Diagnosis not present

## 2017-07-24 DIAGNOSIS — N183 Chronic kidney disease, stage 3 (moderate): Secondary | ICD-10-CM | POA: Diagnosis not present

## 2017-07-25 DIAGNOSIS — I5032 Chronic diastolic (congestive) heart failure: Secondary | ICD-10-CM | POA: Diagnosis not present

## 2017-07-25 DIAGNOSIS — E1122 Type 2 diabetes mellitus with diabetic chronic kidney disease: Secondary | ICD-10-CM | POA: Diagnosis not present

## 2017-07-25 DIAGNOSIS — I25700 Atherosclerosis of coronary artery bypass graft(s), unspecified, with unstable angina pectoris: Secondary | ICD-10-CM | POA: Diagnosis not present

## 2017-07-25 DIAGNOSIS — I130 Hypertensive heart and chronic kidney disease with heart failure and stage 1 through stage 4 chronic kidney disease, or unspecified chronic kidney disease: Secondary | ICD-10-CM | POA: Diagnosis not present

## 2017-07-25 DIAGNOSIS — N183 Chronic kidney disease, stage 3 (moderate): Secondary | ICD-10-CM | POA: Diagnosis not present

## 2017-07-25 DIAGNOSIS — E1121 Type 2 diabetes mellitus with diabetic nephropathy: Secondary | ICD-10-CM | POA: Diagnosis not present

## 2017-07-26 DIAGNOSIS — E1121 Type 2 diabetes mellitus with diabetic nephropathy: Secondary | ICD-10-CM | POA: Diagnosis not present

## 2017-07-26 DIAGNOSIS — I130 Hypertensive heart and chronic kidney disease with heart failure and stage 1 through stage 4 chronic kidney disease, or unspecified chronic kidney disease: Secondary | ICD-10-CM | POA: Diagnosis not present

## 2017-07-26 DIAGNOSIS — I25700 Atherosclerosis of coronary artery bypass graft(s), unspecified, with unstable angina pectoris: Secondary | ICD-10-CM | POA: Diagnosis not present

## 2017-07-26 DIAGNOSIS — N183 Chronic kidney disease, stage 3 (moderate): Secondary | ICD-10-CM | POA: Diagnosis not present

## 2017-07-26 DIAGNOSIS — E1122 Type 2 diabetes mellitus with diabetic chronic kidney disease: Secondary | ICD-10-CM | POA: Diagnosis not present

## 2017-07-26 DIAGNOSIS — I5032 Chronic diastolic (congestive) heart failure: Secondary | ICD-10-CM | POA: Diagnosis not present

## 2017-07-27 ENCOUNTER — Encounter: Payer: Self-pay | Admitting: Family Medicine

## 2017-07-27 DIAGNOSIS — N183 Chronic kidney disease, stage 3 (moderate): Secondary | ICD-10-CM | POA: Diagnosis not present

## 2017-07-27 DIAGNOSIS — E1122 Type 2 diabetes mellitus with diabetic chronic kidney disease: Secondary | ICD-10-CM | POA: Diagnosis not present

## 2017-07-27 DIAGNOSIS — I5032 Chronic diastolic (congestive) heart failure: Secondary | ICD-10-CM | POA: Diagnosis not present

## 2017-07-27 DIAGNOSIS — E1121 Type 2 diabetes mellitus with diabetic nephropathy: Secondary | ICD-10-CM | POA: Diagnosis not present

## 2017-07-27 DIAGNOSIS — I25700 Atherosclerosis of coronary artery bypass graft(s), unspecified, with unstable angina pectoris: Secondary | ICD-10-CM | POA: Diagnosis not present

## 2017-07-27 DIAGNOSIS — I130 Hypertensive heart and chronic kidney disease with heart failure and stage 1 through stage 4 chronic kidney disease, or unspecified chronic kidney disease: Secondary | ICD-10-CM | POA: Diagnosis not present

## 2017-07-28 DIAGNOSIS — E1121 Type 2 diabetes mellitus with diabetic nephropathy: Secondary | ICD-10-CM | POA: Diagnosis not present

## 2017-07-28 DIAGNOSIS — I25700 Atherosclerosis of coronary artery bypass graft(s), unspecified, with unstable angina pectoris: Secondary | ICD-10-CM | POA: Diagnosis not present

## 2017-07-28 DIAGNOSIS — I130 Hypertensive heart and chronic kidney disease with heart failure and stage 1 through stage 4 chronic kidney disease, or unspecified chronic kidney disease: Secondary | ICD-10-CM | POA: Diagnosis not present

## 2017-07-28 DIAGNOSIS — N183 Chronic kidney disease, stage 3 (moderate): Secondary | ICD-10-CM | POA: Diagnosis not present

## 2017-07-28 DIAGNOSIS — I5032 Chronic diastolic (congestive) heart failure: Secondary | ICD-10-CM | POA: Diagnosis not present

## 2017-07-28 DIAGNOSIS — E1122 Type 2 diabetes mellitus with diabetic chronic kidney disease: Secondary | ICD-10-CM | POA: Diagnosis not present

## 2017-07-29 DIAGNOSIS — I130 Hypertensive heart and chronic kidney disease with heart failure and stage 1 through stage 4 chronic kidney disease, or unspecified chronic kidney disease: Secondary | ICD-10-CM | POA: Diagnosis not present

## 2017-07-29 DIAGNOSIS — I25700 Atherosclerosis of coronary artery bypass graft(s), unspecified, with unstable angina pectoris: Secondary | ICD-10-CM | POA: Diagnosis not present

## 2017-07-29 DIAGNOSIS — E1121 Type 2 diabetes mellitus with diabetic nephropathy: Secondary | ICD-10-CM | POA: Diagnosis not present

## 2017-07-29 DIAGNOSIS — I5032 Chronic diastolic (congestive) heart failure: Secondary | ICD-10-CM | POA: Diagnosis not present

## 2017-07-29 DIAGNOSIS — N183 Chronic kidney disease, stage 3 (moderate): Secondary | ICD-10-CM | POA: Diagnosis not present

## 2017-07-29 DIAGNOSIS — E1122 Type 2 diabetes mellitus with diabetic chronic kidney disease: Secondary | ICD-10-CM | POA: Diagnosis not present

## 2017-08-01 DIAGNOSIS — E1121 Type 2 diabetes mellitus with diabetic nephropathy: Secondary | ICD-10-CM | POA: Diagnosis not present

## 2017-08-01 DIAGNOSIS — Z7901 Long term (current) use of anticoagulants: Secondary | ICD-10-CM | POA: Diagnosis not present

## 2017-08-01 DIAGNOSIS — N183 Chronic kidney disease, stage 3 (moderate): Secondary | ICD-10-CM | POA: Diagnosis not present

## 2017-08-01 DIAGNOSIS — N390 Urinary tract infection, site not specified: Secondary | ICD-10-CM | POA: Diagnosis not present

## 2017-08-01 DIAGNOSIS — I130 Hypertensive heart and chronic kidney disease with heart failure and stage 1 through stage 4 chronic kidney disease, or unspecified chronic kidney disease: Secondary | ICD-10-CM | POA: Diagnosis not present

## 2017-08-01 DIAGNOSIS — Z7984 Long term (current) use of oral hypoglycemic drugs: Secondary | ICD-10-CM | POA: Diagnosis not present

## 2017-08-01 DIAGNOSIS — Z9981 Dependence on supplemental oxygen: Secondary | ICD-10-CM | POA: Diagnosis not present

## 2017-08-01 DIAGNOSIS — Z741 Need for assistance with personal care: Secondary | ICD-10-CM | POA: Diagnosis not present

## 2017-08-01 DIAGNOSIS — R32 Unspecified urinary incontinence: Secondary | ICD-10-CM | POA: Diagnosis not present

## 2017-08-01 DIAGNOSIS — E785 Hyperlipidemia, unspecified: Secondary | ICD-10-CM | POA: Diagnosis not present

## 2017-08-01 DIAGNOSIS — E1122 Type 2 diabetes mellitus with diabetic chronic kidney disease: Secondary | ICD-10-CM | POA: Diagnosis not present

## 2017-08-01 DIAGNOSIS — I25700 Atherosclerosis of coronary artery bypass graft(s), unspecified, with unstable angina pectoris: Secondary | ICD-10-CM | POA: Diagnosis not present

## 2017-08-01 DIAGNOSIS — I5032 Chronic diastolic (congestive) heart failure: Secondary | ICD-10-CM | POA: Diagnosis not present

## 2017-08-01 DIAGNOSIS — Z7902 Long term (current) use of antithrombotics/antiplatelets: Secondary | ICD-10-CM | POA: Diagnosis not present

## 2017-08-01 DIAGNOSIS — J9611 Chronic respiratory failure with hypoxia: Secondary | ICD-10-CM | POA: Diagnosis not present

## 2017-08-01 DIAGNOSIS — Z9181 History of falling: Secondary | ICD-10-CM | POA: Diagnosis not present

## 2017-08-01 DIAGNOSIS — Z6822 Body mass index (BMI) 22.0-22.9, adult: Secondary | ICD-10-CM | POA: Diagnosis not present

## 2017-08-02 ENCOUNTER — Telehealth: Payer: Self-pay | Admitting: Family Medicine

## 2017-08-02 DIAGNOSIS — N183 Chronic kidney disease, stage 3 (moderate): Secondary | ICD-10-CM | POA: Diagnosis not present

## 2017-08-02 DIAGNOSIS — I25700 Atherosclerosis of coronary artery bypass graft(s), unspecified, with unstable angina pectoris: Secondary | ICD-10-CM | POA: Diagnosis not present

## 2017-08-02 DIAGNOSIS — I130 Hypertensive heart and chronic kidney disease with heart failure and stage 1 through stage 4 chronic kidney disease, or unspecified chronic kidney disease: Secondary | ICD-10-CM | POA: Diagnosis not present

## 2017-08-02 DIAGNOSIS — E1122 Type 2 diabetes mellitus with diabetic chronic kidney disease: Secondary | ICD-10-CM | POA: Diagnosis not present

## 2017-08-02 DIAGNOSIS — I5032 Chronic diastolic (congestive) heart failure: Secondary | ICD-10-CM | POA: Diagnosis not present

## 2017-08-02 DIAGNOSIS — E1121 Type 2 diabetes mellitus with diabetic nephropathy: Secondary | ICD-10-CM | POA: Diagnosis not present

## 2017-08-02 NOTE — Telephone Encounter (Signed)
Filled and in my outbox

## 2017-08-02 NOTE — Telephone Encounter (Signed)
Daughter faxed fmla paperwork to be filled out In dr g in box

## 2017-08-03 ENCOUNTER — Other Ambulatory Visit: Payer: Self-pay | Admitting: Family Medicine

## 2017-08-03 DIAGNOSIS — I5032 Chronic diastolic (congestive) heart failure: Secondary | ICD-10-CM | POA: Diagnosis not present

## 2017-08-03 DIAGNOSIS — I25700 Atherosclerosis of coronary artery bypass graft(s), unspecified, with unstable angina pectoris: Secondary | ICD-10-CM | POA: Diagnosis not present

## 2017-08-03 DIAGNOSIS — N183 Chronic kidney disease, stage 3 (moderate): Secondary | ICD-10-CM | POA: Diagnosis not present

## 2017-08-03 DIAGNOSIS — I130 Hypertensive heart and chronic kidney disease with heart failure and stage 1 through stage 4 chronic kidney disease, or unspecified chronic kidney disease: Secondary | ICD-10-CM | POA: Diagnosis not present

## 2017-08-03 DIAGNOSIS — E1122 Type 2 diabetes mellitus with diabetic chronic kidney disease: Secondary | ICD-10-CM | POA: Diagnosis not present

## 2017-08-03 DIAGNOSIS — E1121 Type 2 diabetes mellitus with diabetic nephropathy: Secondary | ICD-10-CM | POA: Diagnosis not present

## 2017-08-03 MED ORDER — LORAZEPAM 1 MG PO TABS: 1.0000 mg | ORAL_TABLET | Freq: Every day | ORAL | 3 refills | Status: DC

## 2017-08-03 NOTE — Telephone Encounter (Signed)
Last refill 05/02/17 #30/3

## 2017-08-03 NOTE — Telephone Encounter (Signed)
Per daughters request paperwork mailed Copy for scan

## 2017-08-03 NOTE — Telephone Encounter (Signed)
Copied from [place] [phone]. Topic: Quick Communication - See Telephone Encounter >> Aug 03, 2017 11:39 AM Percell Belt A wrote: CRM for notification. See Telephone encounter for: 08/03/17. Stacy with Hospice called in and stated that pt needs 1mg  adivan called into [facility] .  She stated that it needs to be called in today, pt only has 1 left   [phone]

## 2017-08-03 NOTE — Telephone Encounter (Signed)
plz notify this was sent in. 

## 2017-08-03 NOTE — Telephone Encounter (Signed)
Left message on vm for Stacy with Hospice notifying her the pt's lorazepam 1 mg refill was sent to [facility].

## 2017-08-04 DIAGNOSIS — E1121 Type 2 diabetes mellitus with diabetic nephropathy: Secondary | ICD-10-CM | POA: Diagnosis not present

## 2017-08-04 DIAGNOSIS — E1122 Type 2 diabetes mellitus with diabetic chronic kidney disease: Secondary | ICD-10-CM | POA: Diagnosis not present

## 2017-08-04 DIAGNOSIS — I5032 Chronic diastolic (congestive) heart failure: Secondary | ICD-10-CM | POA: Diagnosis not present

## 2017-08-04 DIAGNOSIS — I25700 Atherosclerosis of coronary artery bypass graft(s), unspecified, with unstable angina pectoris: Secondary | ICD-10-CM | POA: Diagnosis not present

## 2017-08-04 DIAGNOSIS — I130 Hypertensive heart and chronic kidney disease with heart failure and stage 1 through stage 4 chronic kidney disease, or unspecified chronic kidney disease: Secondary | ICD-10-CM | POA: Diagnosis not present

## 2017-08-04 DIAGNOSIS — N183 Chronic kidney disease, stage 3 (moderate): Secondary | ICD-10-CM | POA: Diagnosis not present

## 2017-08-05 DIAGNOSIS — I25700 Atherosclerosis of coronary artery bypass graft(s), unspecified, with unstable angina pectoris: Secondary | ICD-10-CM | POA: Diagnosis not present

## 2017-08-05 DIAGNOSIS — I5032 Chronic diastolic (congestive) heart failure: Secondary | ICD-10-CM | POA: Diagnosis not present

## 2017-08-05 DIAGNOSIS — E1121 Type 2 diabetes mellitus with diabetic nephropathy: Secondary | ICD-10-CM | POA: Diagnosis not present

## 2017-08-05 DIAGNOSIS — E1122 Type 2 diabetes mellitus with diabetic chronic kidney disease: Secondary | ICD-10-CM | POA: Diagnosis not present

## 2017-08-05 DIAGNOSIS — I130 Hypertensive heart and chronic kidney disease with heart failure and stage 1 through stage 4 chronic kidney disease, or unspecified chronic kidney disease: Secondary | ICD-10-CM | POA: Diagnosis not present

## 2017-08-05 DIAGNOSIS — N183 Chronic kidney disease, stage 3 (moderate): Secondary | ICD-10-CM | POA: Diagnosis not present

## 2017-08-08 DIAGNOSIS — I5032 Chronic diastolic (congestive) heart failure: Secondary | ICD-10-CM | POA: Diagnosis not present

## 2017-08-08 DIAGNOSIS — N183 Chronic kidney disease, stage 3 (moderate): Secondary | ICD-10-CM | POA: Diagnosis not present

## 2017-08-08 DIAGNOSIS — E1122 Type 2 diabetes mellitus with diabetic chronic kidney disease: Secondary | ICD-10-CM | POA: Diagnosis not present

## 2017-08-08 DIAGNOSIS — E1121 Type 2 diabetes mellitus with diabetic nephropathy: Secondary | ICD-10-CM | POA: Diagnosis not present

## 2017-08-08 DIAGNOSIS — I25700 Atherosclerosis of coronary artery bypass graft(s), unspecified, with unstable angina pectoris: Secondary | ICD-10-CM | POA: Diagnosis not present

## 2017-08-08 DIAGNOSIS — I130 Hypertensive heart and chronic kidney disease with heart failure and stage 1 through stage 4 chronic kidney disease, or unspecified chronic kidney disease: Secondary | ICD-10-CM | POA: Diagnosis not present

## 2017-08-09 DIAGNOSIS — I5032 Chronic diastolic (congestive) heart failure: Secondary | ICD-10-CM | POA: Diagnosis not present

## 2017-08-09 DIAGNOSIS — I130 Hypertensive heart and chronic kidney disease with heart failure and stage 1 through stage 4 chronic kidney disease, or unspecified chronic kidney disease: Secondary | ICD-10-CM | POA: Diagnosis not present

## 2017-08-09 DIAGNOSIS — N183 Chronic kidney disease, stage 3 (moderate): Secondary | ICD-10-CM | POA: Diagnosis not present

## 2017-08-09 DIAGNOSIS — I25700 Atherosclerosis of coronary artery bypass graft(s), unspecified, with unstable angina pectoris: Secondary | ICD-10-CM | POA: Diagnosis not present

## 2017-08-09 DIAGNOSIS — E1122 Type 2 diabetes mellitus with diabetic chronic kidney disease: Secondary | ICD-10-CM | POA: Diagnosis not present

## 2017-08-09 DIAGNOSIS — E1121 Type 2 diabetes mellitus with diabetic nephropathy: Secondary | ICD-10-CM | POA: Diagnosis not present

## 2017-08-10 DIAGNOSIS — E1121 Type 2 diabetes mellitus with diabetic nephropathy: Secondary | ICD-10-CM | POA: Diagnosis not present

## 2017-08-10 DIAGNOSIS — I130 Hypertensive heart and chronic kidney disease with heart failure and stage 1 through stage 4 chronic kidney disease, or unspecified chronic kidney disease: Secondary | ICD-10-CM | POA: Diagnosis not present

## 2017-08-10 DIAGNOSIS — E1122 Type 2 diabetes mellitus with diabetic chronic kidney disease: Secondary | ICD-10-CM | POA: Diagnosis not present

## 2017-08-10 DIAGNOSIS — I25700 Atherosclerosis of coronary artery bypass graft(s), unspecified, with unstable angina pectoris: Secondary | ICD-10-CM | POA: Diagnosis not present

## 2017-08-10 DIAGNOSIS — N183 Chronic kidney disease, stage 3 (moderate): Secondary | ICD-10-CM | POA: Diagnosis not present

## 2017-08-10 DIAGNOSIS — I5032 Chronic diastolic (congestive) heart failure: Secondary | ICD-10-CM | POA: Diagnosis not present

## 2017-08-11 DIAGNOSIS — E1122 Type 2 diabetes mellitus with diabetic chronic kidney disease: Secondary | ICD-10-CM | POA: Diagnosis not present

## 2017-08-11 DIAGNOSIS — I5032 Chronic diastolic (congestive) heart failure: Secondary | ICD-10-CM | POA: Diagnosis not present

## 2017-08-11 DIAGNOSIS — I25700 Atherosclerosis of coronary artery bypass graft(s), unspecified, with unstable angina pectoris: Secondary | ICD-10-CM | POA: Diagnosis not present

## 2017-08-11 DIAGNOSIS — E1121 Type 2 diabetes mellitus with diabetic nephropathy: Secondary | ICD-10-CM | POA: Diagnosis not present

## 2017-08-11 DIAGNOSIS — I130 Hypertensive heart and chronic kidney disease with heart failure and stage 1 through stage 4 chronic kidney disease, or unspecified chronic kidney disease: Secondary | ICD-10-CM | POA: Diagnosis not present

## 2017-08-11 DIAGNOSIS — N183 Chronic kidney disease, stage 3 (moderate): Secondary | ICD-10-CM | POA: Diagnosis not present

## 2017-08-12 DIAGNOSIS — I130 Hypertensive heart and chronic kidney disease with heart failure and stage 1 through stage 4 chronic kidney disease, or unspecified chronic kidney disease: Secondary | ICD-10-CM | POA: Diagnosis not present

## 2017-08-12 DIAGNOSIS — N183 Chronic kidney disease, stage 3 (moderate): Secondary | ICD-10-CM | POA: Diagnosis not present

## 2017-08-12 DIAGNOSIS — E1121 Type 2 diabetes mellitus with diabetic nephropathy: Secondary | ICD-10-CM | POA: Diagnosis not present

## 2017-08-12 DIAGNOSIS — I25700 Atherosclerosis of coronary artery bypass graft(s), unspecified, with unstable angina pectoris: Secondary | ICD-10-CM | POA: Diagnosis not present

## 2017-08-12 DIAGNOSIS — E1122 Type 2 diabetes mellitus with diabetic chronic kidney disease: Secondary | ICD-10-CM | POA: Diagnosis not present

## 2017-08-12 DIAGNOSIS — I5032 Chronic diastolic (congestive) heart failure: Secondary | ICD-10-CM | POA: Diagnosis not present

## 2017-08-15 DIAGNOSIS — I5032 Chronic diastolic (congestive) heart failure: Secondary | ICD-10-CM | POA: Diagnosis not present

## 2017-08-15 DIAGNOSIS — E1122 Type 2 diabetes mellitus with diabetic chronic kidney disease: Secondary | ICD-10-CM | POA: Diagnosis not present

## 2017-08-15 DIAGNOSIS — I25700 Atherosclerosis of coronary artery bypass graft(s), unspecified, with unstable angina pectoris: Secondary | ICD-10-CM | POA: Diagnosis not present

## 2017-08-15 DIAGNOSIS — N183 Chronic kidney disease, stage 3 (moderate): Secondary | ICD-10-CM | POA: Diagnosis not present

## 2017-08-15 DIAGNOSIS — E1121 Type 2 diabetes mellitus with diabetic nephropathy: Secondary | ICD-10-CM | POA: Diagnosis not present

## 2017-08-15 DIAGNOSIS — I130 Hypertensive heart and chronic kidney disease with heart failure and stage 1 through stage 4 chronic kidney disease, or unspecified chronic kidney disease: Secondary | ICD-10-CM | POA: Diagnosis not present

## 2017-08-16 DIAGNOSIS — E1122 Type 2 diabetes mellitus with diabetic chronic kidney disease: Secondary | ICD-10-CM | POA: Diagnosis not present

## 2017-08-16 DIAGNOSIS — I25700 Atherosclerosis of coronary artery bypass graft(s), unspecified, with unstable angina pectoris: Secondary | ICD-10-CM | POA: Diagnosis not present

## 2017-08-16 DIAGNOSIS — I130 Hypertensive heart and chronic kidney disease with heart failure and stage 1 through stage 4 chronic kidney disease, or unspecified chronic kidney disease: Secondary | ICD-10-CM | POA: Diagnosis not present

## 2017-08-16 DIAGNOSIS — E1121 Type 2 diabetes mellitus with diabetic nephropathy: Secondary | ICD-10-CM | POA: Diagnosis not present

## 2017-08-16 DIAGNOSIS — I5032 Chronic diastolic (congestive) heart failure: Secondary | ICD-10-CM | POA: Diagnosis not present

## 2017-08-16 DIAGNOSIS — N183 Chronic kidney disease, stage 3 (moderate): Secondary | ICD-10-CM | POA: Diagnosis not present

## 2017-08-17 DIAGNOSIS — I25700 Atherosclerosis of coronary artery bypass graft(s), unspecified, with unstable angina pectoris: Secondary | ICD-10-CM | POA: Diagnosis not present

## 2017-08-17 DIAGNOSIS — N183 Chronic kidney disease, stage 3 (moderate): Secondary | ICD-10-CM | POA: Diagnosis not present

## 2017-08-17 DIAGNOSIS — E1122 Type 2 diabetes mellitus with diabetic chronic kidney disease: Secondary | ICD-10-CM | POA: Diagnosis not present

## 2017-08-17 DIAGNOSIS — I130 Hypertensive heart and chronic kidney disease with heart failure and stage 1 through stage 4 chronic kidney disease, or unspecified chronic kidney disease: Secondary | ICD-10-CM | POA: Diagnosis not present

## 2017-08-17 DIAGNOSIS — E1121 Type 2 diabetes mellitus with diabetic nephropathy: Secondary | ICD-10-CM | POA: Diagnosis not present

## 2017-08-17 DIAGNOSIS — I5032 Chronic diastolic (congestive) heart failure: Secondary | ICD-10-CM | POA: Diagnosis not present

## 2017-08-18 ENCOUNTER — Other Ambulatory Visit: Payer: Self-pay | Admitting: Family Medicine

## 2017-08-18 DIAGNOSIS — I130 Hypertensive heart and chronic kidney disease with heart failure and stage 1 through stage 4 chronic kidney disease, or unspecified chronic kidney disease: Secondary | ICD-10-CM | POA: Diagnosis not present

## 2017-08-18 DIAGNOSIS — N183 Chronic kidney disease, stage 3 (moderate): Secondary | ICD-10-CM | POA: Diagnosis not present

## 2017-08-18 DIAGNOSIS — I25700 Atherosclerosis of coronary artery bypass graft(s), unspecified, with unstable angina pectoris: Secondary | ICD-10-CM | POA: Diagnosis not present

## 2017-08-18 DIAGNOSIS — E1121 Type 2 diabetes mellitus with diabetic nephropathy: Secondary | ICD-10-CM | POA: Diagnosis not present

## 2017-08-18 DIAGNOSIS — I5032 Chronic diastolic (congestive) heart failure: Secondary | ICD-10-CM | POA: Diagnosis not present

## 2017-08-18 DIAGNOSIS — E1122 Type 2 diabetes mellitus with diabetic chronic kidney disease: Secondary | ICD-10-CM | POA: Diagnosis not present

## 2017-08-18 NOTE — Telephone Encounter (Signed)
Copied from [place] [phone]. Topic: Quick Communication - Rx Refill/Question >> Aug 18, 2017  3:05 PM Waldemar Dickens, Sade R wrote: Medication:  LORazepam (ATIVAN) 0.5 MG tablet              LORazepam (ATIVAN) 1 MG tablet  Has the patient contacted their pharmacy? Yes   Preferred Pharmacy (with phone number or street name):   Agent: Please be advised that RX refills may take up to 3 business days. We ask that you follow-up with your pharmacy.

## 2017-08-19 ENCOUNTER — Other Ambulatory Visit: Payer: Self-pay | Admitting: Family Medicine

## 2017-08-19 DIAGNOSIS — E1121 Type 2 diabetes mellitus with diabetic nephropathy: Secondary | ICD-10-CM | POA: Diagnosis not present

## 2017-08-19 DIAGNOSIS — E1122 Type 2 diabetes mellitus with diabetic chronic kidney disease: Secondary | ICD-10-CM | POA: Diagnosis not present

## 2017-08-19 DIAGNOSIS — I5032 Chronic diastolic (congestive) heart failure: Secondary | ICD-10-CM | POA: Diagnosis not present

## 2017-08-19 DIAGNOSIS — I25700 Atherosclerosis of coronary artery bypass graft(s), unspecified, with unstable angina pectoris: Secondary | ICD-10-CM | POA: Diagnosis not present

## 2017-08-19 DIAGNOSIS — N183 Chronic kidney disease, stage 3 (moderate): Secondary | ICD-10-CM | POA: Diagnosis not present

## 2017-08-19 DIAGNOSIS — I130 Hypertensive heart and chronic kidney disease with heart failure and stage 1 through stage 4 chronic kidney disease, or unspecified chronic kidney disease: Secondary | ICD-10-CM | POA: Diagnosis not present

## 2017-08-19 MED ORDER — LORAZEPAM 0.5 MG PO TABS: 0.5000 mg | ORAL_TABLET | Freq: Every day | ORAL | 3 refills | Status: DC

## 2017-08-19 NOTE — Telephone Encounter (Signed)
Eprescribed.

## 2017-08-19 NOTE — Telephone Encounter (Signed)
Name of Medication: Lorazepam 0.5 mg Name of Pharmacy: [facility] or Written Date and Quantity: 05/02/17, #30 Last Office Visit and Type: 05/13/17, f/u Next Office Visit and Type: 09/14/17, f/u Last Controlled Substance Agreement Date: none Last UDS: none  Lorazepam 1 mg rx was just done 08/03/17, #30/3

## 2017-08-19 NOTE — Telephone Encounter (Signed)
Copied from [place] [id number]. Topic: Quick Communication - Rx Refill/Question >> Aug 18, 2017  3:05 PM Waldemar Dickens, Sade R wrote: Medication:  LORazepam (ATIVAN) 0.5 MG tablet              LORazepam (ATIVAN) 1 MG tablet  Has the patient contacted their pharmacy? Yes   Preferred Pharmacy (with phone number or street name): [facility], [place] [phone] (Phone) [phone] (Fax)      Agent: Please be advised that RX refills may take up to 3 business days. We ask that you follow-up with your pharmacy.

## 2017-08-19 NOTE — Telephone Encounter (Signed)
Rx refill request: Lorazepam 0.5 mg    Ordered:05/02/17                            Lorazepam 1 mg       Ordered:08/03/17   LOV: 05/13/17  PCP: [place]: verified

## 2017-08-22 ENCOUNTER — Other Ambulatory Visit: Payer: Self-pay | Admitting: Family Medicine

## 2017-08-22 DIAGNOSIS — I130 Hypertensive heart and chronic kidney disease with heart failure and stage 1 through stage 4 chronic kidney disease, or unspecified chronic kidney disease: Secondary | ICD-10-CM | POA: Diagnosis not present

## 2017-08-22 DIAGNOSIS — E1122 Type 2 diabetes mellitus with diabetic chronic kidney disease: Secondary | ICD-10-CM | POA: Diagnosis not present

## 2017-08-22 DIAGNOSIS — I5032 Chronic diastolic (congestive) heart failure: Secondary | ICD-10-CM | POA: Diagnosis not present

## 2017-08-22 DIAGNOSIS — N183 Chronic kidney disease, stage 3 (moderate): Secondary | ICD-10-CM | POA: Diagnosis not present

## 2017-08-22 DIAGNOSIS — I25700 Atherosclerosis of coronary artery bypass graft(s), unspecified, with unstable angina pectoris: Secondary | ICD-10-CM | POA: Diagnosis not present

## 2017-08-22 DIAGNOSIS — E1121 Type 2 diabetes mellitus with diabetic nephropathy: Secondary | ICD-10-CM | POA: Diagnosis not present

## 2017-08-23 DIAGNOSIS — I130 Hypertensive heart and chronic kidney disease with heart failure and stage 1 through stage 4 chronic kidney disease, or unspecified chronic kidney disease: Secondary | ICD-10-CM | POA: Diagnosis not present

## 2017-08-23 DIAGNOSIS — I5032 Chronic diastolic (congestive) heart failure: Secondary | ICD-10-CM | POA: Diagnosis not present

## 2017-08-23 DIAGNOSIS — N183 Chronic kidney disease, stage 3 (moderate): Secondary | ICD-10-CM | POA: Diagnosis not present

## 2017-08-23 DIAGNOSIS — I25700 Atherosclerosis of coronary artery bypass graft(s), unspecified, with unstable angina pectoris: Secondary | ICD-10-CM | POA: Diagnosis not present

## 2017-08-23 DIAGNOSIS — E1122 Type 2 diabetes mellitus with diabetic chronic kidney disease: Secondary | ICD-10-CM | POA: Diagnosis not present

## 2017-08-23 DIAGNOSIS — E1121 Type 2 diabetes mellitus with diabetic nephropathy: Secondary | ICD-10-CM | POA: Diagnosis not present

## 2017-08-23 NOTE — Telephone Encounter (Signed)
Pharmacy requests refill on Sertraline:  Last OV: 05/13/17 Next OV: 09/14/17 Last Refill: #90, 1 refill on 02/07/17  Will refill X 1 to carry through appointment.

## 2017-08-24 DIAGNOSIS — E1122 Type 2 diabetes mellitus with diabetic chronic kidney disease: Secondary | ICD-10-CM | POA: Diagnosis not present

## 2017-08-24 DIAGNOSIS — I5032 Chronic diastolic (congestive) heart failure: Secondary | ICD-10-CM | POA: Diagnosis not present

## 2017-08-24 DIAGNOSIS — I130 Hypertensive heart and chronic kidney disease with heart failure and stage 1 through stage 4 chronic kidney disease, or unspecified chronic kidney disease: Secondary | ICD-10-CM | POA: Diagnosis not present

## 2017-08-24 DIAGNOSIS — I25700 Atherosclerosis of coronary artery bypass graft(s), unspecified, with unstable angina pectoris: Secondary | ICD-10-CM | POA: Diagnosis not present

## 2017-08-24 DIAGNOSIS — N183 Chronic kidney disease, stage 3 (moderate): Secondary | ICD-10-CM | POA: Diagnosis not present

## 2017-08-24 DIAGNOSIS — E1121 Type 2 diabetes mellitus with diabetic nephropathy: Secondary | ICD-10-CM | POA: Diagnosis not present

## 2017-08-25 DIAGNOSIS — I5032 Chronic diastolic (congestive) heart failure: Secondary | ICD-10-CM | POA: Diagnosis not present

## 2017-08-25 DIAGNOSIS — I25700 Atherosclerosis of coronary artery bypass graft(s), unspecified, with unstable angina pectoris: Secondary | ICD-10-CM | POA: Diagnosis not present

## 2017-08-25 DIAGNOSIS — E1122 Type 2 diabetes mellitus with diabetic chronic kidney disease: Secondary | ICD-10-CM | POA: Diagnosis not present

## 2017-08-25 DIAGNOSIS — N183 Chronic kidney disease, stage 3 (moderate): Secondary | ICD-10-CM | POA: Diagnosis not present

## 2017-08-25 DIAGNOSIS — E1121 Type 2 diabetes mellitus with diabetic nephropathy: Secondary | ICD-10-CM | POA: Diagnosis not present

## 2017-08-25 DIAGNOSIS — I130 Hypertensive heart and chronic kidney disease with heart failure and stage 1 through stage 4 chronic kidney disease, or unspecified chronic kidney disease: Secondary | ICD-10-CM | POA: Diagnosis not present

## 2017-08-26 DIAGNOSIS — E1122 Type 2 diabetes mellitus with diabetic chronic kidney disease: Secondary | ICD-10-CM | POA: Diagnosis not present

## 2017-08-26 DIAGNOSIS — E1121 Type 2 diabetes mellitus with diabetic nephropathy: Secondary | ICD-10-CM | POA: Diagnosis not present

## 2017-08-26 DIAGNOSIS — I25700 Atherosclerosis of coronary artery bypass graft(s), unspecified, with unstable angina pectoris: Secondary | ICD-10-CM | POA: Diagnosis not present

## 2017-08-26 DIAGNOSIS — N183 Chronic kidney disease, stage 3 (moderate): Secondary | ICD-10-CM | POA: Diagnosis not present

## 2017-08-26 DIAGNOSIS — I130 Hypertensive heart and chronic kidney disease with heart failure and stage 1 through stage 4 chronic kidney disease, or unspecified chronic kidney disease: Secondary | ICD-10-CM | POA: Diagnosis not present

## 2017-08-26 DIAGNOSIS — I5032 Chronic diastolic (congestive) heart failure: Secondary | ICD-10-CM | POA: Diagnosis not present

## 2017-08-29 DIAGNOSIS — I130 Hypertensive heart and chronic kidney disease with heart failure and stage 1 through stage 4 chronic kidney disease, or unspecified chronic kidney disease: Secondary | ICD-10-CM | POA: Diagnosis not present

## 2017-08-29 DIAGNOSIS — N183 Chronic kidney disease, stage 3 (moderate): Secondary | ICD-10-CM | POA: Diagnosis not present

## 2017-08-29 DIAGNOSIS — I5032 Chronic diastolic (congestive) heart failure: Secondary | ICD-10-CM | POA: Diagnosis not present

## 2017-08-29 DIAGNOSIS — E1122 Type 2 diabetes mellitus with diabetic chronic kidney disease: Secondary | ICD-10-CM | POA: Diagnosis not present

## 2017-08-29 DIAGNOSIS — E1121 Type 2 diabetes mellitus with diabetic nephropathy: Secondary | ICD-10-CM | POA: Diagnosis not present

## 2017-08-29 DIAGNOSIS — I25700 Atherosclerosis of coronary artery bypass graft(s), unspecified, with unstable angina pectoris: Secondary | ICD-10-CM | POA: Diagnosis not present

## 2017-08-30 DIAGNOSIS — I25700 Atherosclerosis of coronary artery bypass graft(s), unspecified, with unstable angina pectoris: Secondary | ICD-10-CM | POA: Diagnosis not present

## 2017-08-30 DIAGNOSIS — E1121 Type 2 diabetes mellitus with diabetic nephropathy: Secondary | ICD-10-CM | POA: Diagnosis not present

## 2017-08-30 DIAGNOSIS — I130 Hypertensive heart and chronic kidney disease with heart failure and stage 1 through stage 4 chronic kidney disease, or unspecified chronic kidney disease: Secondary | ICD-10-CM | POA: Diagnosis not present

## 2017-08-30 DIAGNOSIS — N183 Chronic kidney disease, stage 3 (moderate): Secondary | ICD-10-CM | POA: Diagnosis not present

## 2017-08-30 DIAGNOSIS — I5032 Chronic diastolic (congestive) heart failure: Secondary | ICD-10-CM | POA: Diagnosis not present

## 2017-08-30 DIAGNOSIS — E1122 Type 2 diabetes mellitus with diabetic chronic kidney disease: Secondary | ICD-10-CM | POA: Diagnosis not present

## 2017-08-31 DIAGNOSIS — E1122 Type 2 diabetes mellitus with diabetic chronic kidney disease: Secondary | ICD-10-CM | POA: Diagnosis not present

## 2017-08-31 DIAGNOSIS — I130 Hypertensive heart and chronic kidney disease with heart failure and stage 1 through stage 4 chronic kidney disease, or unspecified chronic kidney disease: Secondary | ICD-10-CM | POA: Diagnosis not present

## 2017-08-31 DIAGNOSIS — I25700 Atherosclerosis of coronary artery bypass graft(s), unspecified, with unstable angina pectoris: Secondary | ICD-10-CM | POA: Diagnosis not present

## 2017-08-31 DIAGNOSIS — I5032 Chronic diastolic (congestive) heart failure: Secondary | ICD-10-CM | POA: Diagnosis not present

## 2017-08-31 DIAGNOSIS — E1121 Type 2 diabetes mellitus with diabetic nephropathy: Secondary | ICD-10-CM | POA: Diagnosis not present

## 2017-08-31 DIAGNOSIS — N183 Chronic kidney disease, stage 3 (moderate): Secondary | ICD-10-CM | POA: Diagnosis not present

## 2017-09-01 DIAGNOSIS — E785 Hyperlipidemia, unspecified: Secondary | ICD-10-CM | POA: Diagnosis not present

## 2017-09-01 DIAGNOSIS — Z947 Corneal transplant status: Secondary | ICD-10-CM | POA: Diagnosis not present

## 2017-09-01 DIAGNOSIS — Z7902 Long term (current) use of antithrombotics/antiplatelets: Secondary | ICD-10-CM | POA: Diagnosis not present

## 2017-09-01 DIAGNOSIS — Z7901 Long term (current) use of anticoagulants: Secondary | ICD-10-CM | POA: Diagnosis not present

## 2017-09-01 DIAGNOSIS — Z741 Need for assistance with personal care: Secondary | ICD-10-CM | POA: Diagnosis not present

## 2017-09-01 DIAGNOSIS — I25700 Atherosclerosis of coronary artery bypass graft(s), unspecified, with unstable angina pectoris: Secondary | ICD-10-CM | POA: Diagnosis not present

## 2017-09-01 DIAGNOSIS — R32 Unspecified urinary incontinence: Secondary | ICD-10-CM | POA: Diagnosis not present

## 2017-09-01 DIAGNOSIS — I5032 Chronic diastolic (congestive) heart failure: Secondary | ICD-10-CM | POA: Diagnosis not present

## 2017-09-01 DIAGNOSIS — Z7984 Long term (current) use of oral hypoglycemic drugs: Secondary | ICD-10-CM | POA: Diagnosis not present

## 2017-09-01 DIAGNOSIS — Z9981 Dependence on supplemental oxygen: Secondary | ICD-10-CM | POA: Diagnosis not present

## 2017-09-01 DIAGNOSIS — E1121 Type 2 diabetes mellitus with diabetic nephropathy: Secondary | ICD-10-CM | POA: Diagnosis not present

## 2017-09-01 DIAGNOSIS — N183 Chronic kidney disease, stage 3 (moderate): Secondary | ICD-10-CM | POA: Diagnosis not present

## 2017-09-01 DIAGNOSIS — Z6821 Body mass index (BMI) 21.0-21.9, adult: Secondary | ICD-10-CM | POA: Diagnosis not present

## 2017-09-01 DIAGNOSIS — J9611 Chronic respiratory failure with hypoxia: Secondary | ICD-10-CM | POA: Diagnosis not present

## 2017-09-01 DIAGNOSIS — E1122 Type 2 diabetes mellitus with diabetic chronic kidney disease: Secondary | ICD-10-CM | POA: Diagnosis not present

## 2017-09-01 DIAGNOSIS — I130 Hypertensive heart and chronic kidney disease with heart failure and stage 1 through stage 4 chronic kidney disease, or unspecified chronic kidney disease: Secondary | ICD-10-CM | POA: Diagnosis not present

## 2017-09-01 DIAGNOSIS — Z9181 History of falling: Secondary | ICD-10-CM | POA: Diagnosis not present

## 2017-09-02 DIAGNOSIS — I130 Hypertensive heart and chronic kidney disease with heart failure and stage 1 through stage 4 chronic kidney disease, or unspecified chronic kidney disease: Secondary | ICD-10-CM | POA: Diagnosis not present

## 2017-09-02 DIAGNOSIS — I5032 Chronic diastolic (congestive) heart failure: Secondary | ICD-10-CM | POA: Diagnosis not present

## 2017-09-02 DIAGNOSIS — E1121 Type 2 diabetes mellitus with diabetic nephropathy: Secondary | ICD-10-CM | POA: Diagnosis not present

## 2017-09-02 DIAGNOSIS — E1122 Type 2 diabetes mellitus with diabetic chronic kidney disease: Secondary | ICD-10-CM | POA: Diagnosis not present

## 2017-09-02 DIAGNOSIS — I25700 Atherosclerosis of coronary artery bypass graft(s), unspecified, with unstable angina pectoris: Secondary | ICD-10-CM | POA: Diagnosis not present

## 2017-09-02 DIAGNOSIS — N183 Chronic kidney disease, stage 3 (moderate): Secondary | ICD-10-CM | POA: Diagnosis not present

## 2017-09-05 DIAGNOSIS — I130 Hypertensive heart and chronic kidney disease with heart failure and stage 1 through stage 4 chronic kidney disease, or unspecified chronic kidney disease: Secondary | ICD-10-CM | POA: Diagnosis not present

## 2017-09-05 DIAGNOSIS — E1121 Type 2 diabetes mellitus with diabetic nephropathy: Secondary | ICD-10-CM | POA: Diagnosis not present

## 2017-09-05 DIAGNOSIS — N183 Chronic kidney disease, stage 3 (moderate): Secondary | ICD-10-CM | POA: Diagnosis not present

## 2017-09-05 DIAGNOSIS — I25700 Atherosclerosis of coronary artery bypass graft(s), unspecified, with unstable angina pectoris: Secondary | ICD-10-CM | POA: Diagnosis not present

## 2017-09-05 DIAGNOSIS — I5032 Chronic diastolic (congestive) heart failure: Secondary | ICD-10-CM | POA: Diagnosis not present

## 2017-09-05 DIAGNOSIS — E1122 Type 2 diabetes mellitus with diabetic chronic kidney disease: Secondary | ICD-10-CM | POA: Diagnosis not present

## 2017-09-06 DIAGNOSIS — I130 Hypertensive heart and chronic kidney disease with heart failure and stage 1 through stage 4 chronic kidney disease, or unspecified chronic kidney disease: Secondary | ICD-10-CM | POA: Diagnosis not present

## 2017-09-06 DIAGNOSIS — E1122 Type 2 diabetes mellitus with diabetic chronic kidney disease: Secondary | ICD-10-CM | POA: Diagnosis not present

## 2017-09-06 DIAGNOSIS — I5032 Chronic diastolic (congestive) heart failure: Secondary | ICD-10-CM | POA: Diagnosis not present

## 2017-09-06 DIAGNOSIS — E1121 Type 2 diabetes mellitus with diabetic nephropathy: Secondary | ICD-10-CM | POA: Diagnosis not present

## 2017-09-06 DIAGNOSIS — N183 Chronic kidney disease, stage 3 (moderate): Secondary | ICD-10-CM | POA: Diagnosis not present

## 2017-09-06 DIAGNOSIS — I25700 Atherosclerosis of coronary artery bypass graft(s), unspecified, with unstable angina pectoris: Secondary | ICD-10-CM | POA: Diagnosis not present

## 2017-09-07 DIAGNOSIS — I5032 Chronic diastolic (congestive) heart failure: Secondary | ICD-10-CM | POA: Diagnosis not present

## 2017-09-07 DIAGNOSIS — I130 Hypertensive heart and chronic kidney disease with heart failure and stage 1 through stage 4 chronic kidney disease, or unspecified chronic kidney disease: Secondary | ICD-10-CM | POA: Diagnosis not present

## 2017-09-07 DIAGNOSIS — I25700 Atherosclerosis of coronary artery bypass graft(s), unspecified, with unstable angina pectoris: Secondary | ICD-10-CM | POA: Diagnosis not present

## 2017-09-07 DIAGNOSIS — N183 Chronic kidney disease, stage 3 (moderate): Secondary | ICD-10-CM | POA: Diagnosis not present

## 2017-09-07 DIAGNOSIS — E1122 Type 2 diabetes mellitus with diabetic chronic kidney disease: Secondary | ICD-10-CM | POA: Diagnosis not present

## 2017-09-07 DIAGNOSIS — E1121 Type 2 diabetes mellitus with diabetic nephropathy: Secondary | ICD-10-CM | POA: Diagnosis not present

## 2017-09-08 DIAGNOSIS — E1121 Type 2 diabetes mellitus with diabetic nephropathy: Secondary | ICD-10-CM | POA: Diagnosis not present

## 2017-09-08 DIAGNOSIS — I25700 Atherosclerosis of coronary artery bypass graft(s), unspecified, with unstable angina pectoris: Secondary | ICD-10-CM | POA: Diagnosis not present

## 2017-09-08 DIAGNOSIS — E1122 Type 2 diabetes mellitus with diabetic chronic kidney disease: Secondary | ICD-10-CM | POA: Diagnosis not present

## 2017-09-08 DIAGNOSIS — I5032 Chronic diastolic (congestive) heart failure: Secondary | ICD-10-CM | POA: Diagnosis not present

## 2017-09-08 DIAGNOSIS — I130 Hypertensive heart and chronic kidney disease with heart failure and stage 1 through stage 4 chronic kidney disease, or unspecified chronic kidney disease: Secondary | ICD-10-CM | POA: Diagnosis not present

## 2017-09-08 DIAGNOSIS — N183 Chronic kidney disease, stage 3 (moderate): Secondary | ICD-10-CM | POA: Diagnosis not present

## 2017-09-09 DIAGNOSIS — E1121 Type 2 diabetes mellitus with diabetic nephropathy: Secondary | ICD-10-CM | POA: Diagnosis not present

## 2017-09-09 DIAGNOSIS — I130 Hypertensive heart and chronic kidney disease with heart failure and stage 1 through stage 4 chronic kidney disease, or unspecified chronic kidney disease: Secondary | ICD-10-CM | POA: Diagnosis not present

## 2017-09-09 DIAGNOSIS — I5032 Chronic diastolic (congestive) heart failure: Secondary | ICD-10-CM | POA: Diagnosis not present

## 2017-09-09 DIAGNOSIS — E1122 Type 2 diabetes mellitus with diabetic chronic kidney disease: Secondary | ICD-10-CM | POA: Diagnosis not present

## 2017-09-09 DIAGNOSIS — I25700 Atherosclerosis of coronary artery bypass graft(s), unspecified, with unstable angina pectoris: Secondary | ICD-10-CM | POA: Diagnosis not present

## 2017-09-09 DIAGNOSIS — N183 Chronic kidney disease, stage 3 (moderate): Secondary | ICD-10-CM | POA: Diagnosis not present

## 2017-09-12 DIAGNOSIS — N183 Chronic kidney disease, stage 3 (moderate): Secondary | ICD-10-CM | POA: Diagnosis not present

## 2017-09-12 DIAGNOSIS — I130 Hypertensive heart and chronic kidney disease with heart failure and stage 1 through stage 4 chronic kidney disease, or unspecified chronic kidney disease: Secondary | ICD-10-CM | POA: Diagnosis not present

## 2017-09-12 DIAGNOSIS — I5032 Chronic diastolic (congestive) heart failure: Secondary | ICD-10-CM | POA: Diagnosis not present

## 2017-09-12 DIAGNOSIS — I25700 Atherosclerosis of coronary artery bypass graft(s), unspecified, with unstable angina pectoris: Secondary | ICD-10-CM | POA: Diagnosis not present

## 2017-09-12 DIAGNOSIS — E1122 Type 2 diabetes mellitus with diabetic chronic kidney disease: Secondary | ICD-10-CM | POA: Diagnosis not present

## 2017-09-12 DIAGNOSIS — E1121 Type 2 diabetes mellitus with diabetic nephropathy: Secondary | ICD-10-CM | POA: Diagnosis not present

## 2017-09-13 DIAGNOSIS — I25700 Atherosclerosis of coronary artery bypass graft(s), unspecified, with unstable angina pectoris: Secondary | ICD-10-CM | POA: Diagnosis not present

## 2017-09-13 DIAGNOSIS — E1122 Type 2 diabetes mellitus with diabetic chronic kidney disease: Secondary | ICD-10-CM | POA: Diagnosis not present

## 2017-09-13 DIAGNOSIS — I130 Hypertensive heart and chronic kidney disease with heart failure and stage 1 through stage 4 chronic kidney disease, or unspecified chronic kidney disease: Secondary | ICD-10-CM | POA: Diagnosis not present

## 2017-09-13 DIAGNOSIS — I5032 Chronic diastolic (congestive) heart failure: Secondary | ICD-10-CM | POA: Diagnosis not present

## 2017-09-13 DIAGNOSIS — N183 Chronic kidney disease, stage 3 (moderate): Secondary | ICD-10-CM | POA: Diagnosis not present

## 2017-09-13 DIAGNOSIS — E1121 Type 2 diabetes mellitus with diabetic nephropathy: Secondary | ICD-10-CM | POA: Diagnosis not present

## 2017-09-14 ENCOUNTER — Encounter: Payer: Self-pay | Admitting: Family Medicine

## 2017-09-14 ENCOUNTER — Ambulatory Visit (INDEPENDENT_AMBULATORY_CARE_PROVIDER_SITE_OTHER): Admitting: Family Medicine

## 2017-09-14 VITALS — BP 118/62 | HR 63 | Temp 98.2°F | Ht 65.0 in | Wt 130.5 lb

## 2017-09-14 DIAGNOSIS — I130 Hypertensive heart and chronic kidney disease with heart failure and stage 1 through stage 4 chronic kidney disease, or unspecified chronic kidney disease: Secondary | ICD-10-CM | POA: Diagnosis not present

## 2017-09-14 DIAGNOSIS — Z515 Encounter for palliative care: Secondary | ICD-10-CM

## 2017-09-14 DIAGNOSIS — R110 Nausea: Secondary | ICD-10-CM

## 2017-09-14 DIAGNOSIS — R531 Weakness: Secondary | ICD-10-CM

## 2017-09-14 DIAGNOSIS — I25700 Atherosclerosis of coronary artery bypass graft(s), unspecified, with unstable angina pectoris: Secondary | ICD-10-CM | POA: Diagnosis not present

## 2017-09-14 DIAGNOSIS — E1121 Type 2 diabetes mellitus with diabetic nephropathy: Secondary | ICD-10-CM | POA: Diagnosis not present

## 2017-09-14 DIAGNOSIS — M87051 Idiopathic aseptic necrosis of right femur: Secondary | ICD-10-CM | POA: Insufficient documentation

## 2017-09-14 DIAGNOSIS — F331 Major depressive disorder, recurrent, moderate: Secondary | ICD-10-CM | POA: Diagnosis not present

## 2017-09-14 DIAGNOSIS — I5032 Chronic diastolic (congestive) heart failure: Secondary | ICD-10-CM | POA: Diagnosis not present

## 2017-09-14 DIAGNOSIS — M25551 Pain in right hip: Secondary | ICD-10-CM | POA: Insufficient documentation

## 2017-09-14 DIAGNOSIS — N183 Chronic kidney disease, stage 3 (moderate): Secondary | ICD-10-CM | POA: Diagnosis not present

## 2017-09-14 DIAGNOSIS — E1122 Type 2 diabetes mellitus with diabetic chronic kidney disease: Secondary | ICD-10-CM | POA: Diagnosis not present

## 2017-09-14 LAB — POCT GLYCOSYLATED HEMOGLOBIN (HGB A1C): Hemoglobin A1C: 6.4 % — AB (ref 4.0–5.6)

## 2017-09-14 MED ORDER — SERTRALINE HCL 50 MG PO TABS: 75.0000 mg | ORAL_TABLET | Freq: Every day | ORAL | 0 refills | Status: AC

## 2017-09-14 NOTE — Patient Instructions (Addendum)
Increase sertraline to 75mg  daily.  Stop metformin.  I think R leg pain may be more muscle soreness. Let me know if worsening.  Return in 3 months for follow up.

## 2017-09-14 NOTE — Assessment & Plan Note (Signed)
Chronic, stable. A1c improved. With ongoing nausea, will discontinue metformin at this time.

## 2017-09-14 NOTE — Assessment & Plan Note (Signed)
Anticipate muscle soreness, not hip or bursa pathology.

## 2017-09-14 NOTE — Assessment & Plan Note (Addendum)
Chronic, deteriorated. Will trial higher sertraline to 75mg  daily. Continue lorazepam 0.5mg /1mg  daily.

## 2017-09-14 NOTE — Progress Notes (Addendum)
BP 118/62 (BP Location: Left Arm, Patient Position: Sitting, Cuff Size: Normal)   Pulse 63   Temp 98.2 F (36.8 C) (Oral)   Ht _0  (1.651 m)   Wt 130 lb 8 oz (59.2 kg)   SpO2 98% Comment: 2 L, pulsating  BMI 21.72 kg/m    CC: 4 mo f/u visit Subjective:    Patient ID: Christine Blanchard, female    DOB: 22-Sep-1928, 84 y.o.   MRN: [id number]  HPI: Christine Blanchard is a 84 y.o. female presenting on 09/14/2017 for 4 mo follow up (Wants to discuss Zoloft. Does not work as much anymore.) and Form Completion (Has 2 forms to be completed. Form in envelope needs to go back with pt today.)   Forms for [employer] filled out today.  She is now a great grandmother! Progressive weight loss noted, progressive weakness note.  A1c better. Takes lantus 10 units with breakfast and metformin with dinner. No appetite. Denies significant low sugars or hypoglycemic symptoms.   Notes increased R hip pain - this is the hip she broke (s/p R closed reduction percutaneous pinning R femoral neck fracture 03/2014.   Relevant past medical, surgical, family and social history reviewed and updated as indicated. Interim medical history since our last visit reviewed. Allergies and medications reviewed and updated. Outpatient Medications Prior to Visit  Medication Sig Dispense Refill  . acetaminophen (TYLENOL) 500 MG tablet Take 500 mg by mouth 3 (three) times daily.    . ASPIRIN LOW DOSE 81 MG chewable tablet TAKE 1 TABLET BY MOUTH ONCE DAILY 30 tablet 6  . atorvastatin (LIPITOR) 20 MG tablet TAKE 1 TABLET NIGHTLY 90 tablet 2  . BD AUTOSHIELD DUO 30G X 5 MM MISC Use as instructed with insulin Dx: E11.21 100 each 3  . cetirizine (ZYRTEC) 10 MG tablet Take 1 tablet (10 mg total) by mouth daily as needed for allergies. 30 tablet 5  . clopidogrel (PLAVIX) 75 MG tablet TAKE 1 TABLET DAILY 90 tablet 3  . docusate sodium (COLACE) 100 MG capsule Take 1 capsule (100 mg total) by mouth daily. (Patient taking differently: Take  100 mg by mouth daily. Taking 1 tablet twice a day) 90 capsule 1  . glucose blood (FREESTYLE LITE) test strip Use to check sugar three times daily Dx: 250.40 100 each 3  . glucose monitoring kit (FREESTYLE) monitoring kit 1 each by Does not apply route as needed for other. 1 each 0  . isosorbide mononitrate (IMDUR) 60 MG 24 hr tablet TAKE 1 TABLET AT BEDTIME 90 tablet 1  . KLOR-CON M10 10 MEQ tablet TAKE 2 TABLETS DAILY 180 tablet 1  . Lancets (FREESTYLE) lancets USE TO CHECK SUGAR THREE TIMES A DAY 300 each 2  . LANTUS SOLOSTAR 100 UNIT/ML Solostar Pen INJECT 10 UNITS UNDER THE SKIN DAILY WITH BREAKFAST (DIRECTION CHANGE) 15 mL 6  . LORazepam (ATIVAN) 0.5 MG tablet Take 1 tablet (0.5 mg total) by mouth daily. At noon 30 tablet 3  . LORazepam (ATIVAN) 1 MG tablet Take 1 tablet (1 mg total) by mouth at bedtime. At 10pm 30 tablet 3  . meclizine (ANTIVERT) 12.5 MG tablet One tablet by mouth daily as needed for vertigo 30 tablet 0  . metoprolol tartrate (LOPRESSOR) 25 MG tablet TAKE 1 TABLET TWICE A DAY 180 tablet 3  . Multiple Vitamin (MULTIVITAMIN) capsule Take 1 capsule by mouth daily.      . nitroGLYCERIN (NITROSTAT) 0.4 MG SL tablet DISSOLVE 1 TABLET  UNDER THE TONGUE AS NEEDED 25 tablet 2  . ondansetron (ZOFRAN) 4 MG tablet Take 1 tablet (4 mg total) by mouth 2 (two) times daily as needed for nausea or vomiting. 30 tablet 1  . prednisoLONE sodium phosphate (INFLAMASE FORTE) 1 % ophthalmic solution Place 1 drop into both eyes daily. 5 mL 3  . torsemide (DEMADEX) 20 MG tablet TAKE 2 TABLETS EVERY MORNING AND 1 EXTRA TABLET ON MONDAY, WEDNESDAY, AND FRIDAY IF NEEDED 225 tablet 1  . traMADol (ULTRAM) 50 MG tablet TAKE ONE TABLET BY MOUTH 2 TIMES A DAY AT 3PM AND 10PM 60 tablet 3  . traZODone (DESYREL) 50 MG tablet Take 0.5 tablets (25 mg total) by mouth at bedtime. 90 tablet 3  . Vitamin D, Ergocalciferol, (DRISDOL) 50000 units CAPS capsule TAKE 1 CAPSULE EVERY WEEK 12 capsule 1  . metFORMIN  (GLUCOPHAGE-XR) 500 MG 24 hr tablet TAKE 1 TABLET AT BEDTIME 90 tablet 0  . sertraline (ZOLOFT) 50 MG tablet TAKE 1 TABLET DAILY 90 tablet 0  . pantoprazole (PROTONIX) 40 MG tablet TAKE 1 TABLET DAILY 90 tablet 1  . ranitidine (ZANTAC) 150 MG tablet Take 1 tablet (150 mg total) by mouth at bedtime. 90 tablet 1  . cephALEXin (KEFLEX) 500 MG capsule Take 1 capsule (500 mg total) by mouth 2 (two) times daily. 14 capsule 0  . magnesium hydroxide (MILK OF MAGNESIA) 400 MG/5ML suspension Take 30 mLs by mouth daily as needed for mild constipation. *IF NO RESULTS, SEE FLEETS ORDER*     No facility-administered medications prior to visit.      Per HPI unless specifically indicated in ROS section below Review of Systems     Objective:    BP 118/62 (BP Location: Left Arm, Patient Position: Sitting, Cuff Size: Normal)   Pulse 63   Temp 98.2 F (36.8 C) (Oral)   Ht _0  (1.651 m)   Wt 130 lb 8 oz (59.2 kg)   SpO2 98% Comment: 2 L, pulsating  BMI 21.72 kg/m   Wt Readings from Last 3 Encounters:  09/14/17 130 lb 8 oz (59.2 kg)  05/13/17 133 lb 8 oz (60.6 kg)  01/20/17 136 lb 4 oz (61.8 kg)    Physical Exam  Constitutional: She appears well-developed and well-nourished. No distress.  HENT:  Mouth/Throat: No oropharyngeal exudate.  Dry MM  Cardiovascular: Normal rate and regular rhythm.  Murmur (? mid systolic) heard. Pulmonary/Chest: Effort normal and breath sounds normal. No respiratory distress. She has no wheezes. She has no rales.  Musculoskeletal: She exhibits no edema.  No pain to palpation at R hip bursa, along R femur, or with int/ext rotation at R hip. Limited ROM at R hip L hip with limited ROM but otherwise normal  Psychiatric: She has a normal mood and affect.  Nursing note and vitals reviewed.  Results for orders placed or performed in visit on 09/14/17  POCT glycosylated hemoglobin (Hb A1C)  Result Value Ref Range   Hemoglobin A1C 6.4 (A) 4.0 - 5.6 %   HbA1c POC (<>  result, manual entry)     HbA1c, POC (prediabetic range)     HbA1c, POC (controlled diabetic range)     Lab Results  Component Value Date   CREATININE 0.87 05/13/2017   BUN 12 05/13/2017   NA 141 05/13/2017   K 3.6 05/13/2017   CL 96 05/13/2017   CO2 36 (H) 05/13/2017       Assessment & Plan:   Problem List Items Addressed  This Visit    Well controlled type 2 diabetes mellitus with nephropathy ([place]) - Primary    Chronic, stable. A1c improved. With ongoing nausea, will discontinue metformin at this time.       Relevant Orders   POCT glycosylated hemoglobin (Hb A1C) (Completed)   Right hip pain    Anticipate muscle soreness, not hip or bursa pathology.       Nausea   MDD (major depressive disorder), recurrent episode, moderate (HCC)    Chronic, deteriorated. Will trial higher sertraline to 25m daily. Continue lorazepam 0.518m1mg daily.      Relevant Medications   sertraline (ZOLOFT) 50 MG tablet   Hospice care patient   General weakness       Meds ordered this encounter  Medications  . sertraline (ZOLOFT) 50 MG tablet    Sig: Take 1.5 tablets (75 mg total) by mouth daily.    Dispense:  135 tablet    Refill:  0   Orders Placed This Encounter  Procedures  . POCT glycosylated hemoglobin (Hb A1C)    Follow up plan: Return in about 3 months (around 12/15/2017) for follow up visit.  JaRia BushMD

## 2017-09-15 ENCOUNTER — Encounter: Payer: Self-pay | Admitting: Family Medicine

## 2017-09-15 DIAGNOSIS — I5032 Chronic diastolic (congestive) heart failure: Secondary | ICD-10-CM | POA: Diagnosis not present

## 2017-09-15 DIAGNOSIS — I25700 Atherosclerosis of coronary artery bypass graft(s), unspecified, with unstable angina pectoris: Secondary | ICD-10-CM | POA: Diagnosis not present

## 2017-09-15 DIAGNOSIS — N183 Chronic kidney disease, stage 3 (moderate): Secondary | ICD-10-CM | POA: Diagnosis not present

## 2017-09-15 DIAGNOSIS — E1122 Type 2 diabetes mellitus with diabetic chronic kidney disease: Secondary | ICD-10-CM | POA: Diagnosis not present

## 2017-09-15 DIAGNOSIS — E1121 Type 2 diabetes mellitus with diabetic nephropathy: Secondary | ICD-10-CM | POA: Diagnosis not present

## 2017-09-15 DIAGNOSIS — I130 Hypertensive heart and chronic kidney disease with heart failure and stage 1 through stage 4 chronic kidney disease, or unspecified chronic kidney disease: Secondary | ICD-10-CM | POA: Diagnosis not present

## 2017-09-16 DIAGNOSIS — N183 Chronic kidney disease, stage 3 (moderate): Secondary | ICD-10-CM | POA: Diagnosis not present

## 2017-09-16 DIAGNOSIS — I25700 Atherosclerosis of coronary artery bypass graft(s), unspecified, with unstable angina pectoris: Secondary | ICD-10-CM | POA: Diagnosis not present

## 2017-09-16 DIAGNOSIS — I130 Hypertensive heart and chronic kidney disease with heart failure and stage 1 through stage 4 chronic kidney disease, or unspecified chronic kidney disease: Secondary | ICD-10-CM | POA: Diagnosis not present

## 2017-09-16 DIAGNOSIS — I5032 Chronic diastolic (congestive) heart failure: Secondary | ICD-10-CM | POA: Diagnosis not present

## 2017-09-16 DIAGNOSIS — E1122 Type 2 diabetes mellitus with diabetic chronic kidney disease: Secondary | ICD-10-CM | POA: Diagnosis not present

## 2017-09-16 DIAGNOSIS — E1121 Type 2 diabetes mellitus with diabetic nephropathy: Secondary | ICD-10-CM | POA: Diagnosis not present

## 2017-09-19 DIAGNOSIS — I25700 Atherosclerosis of coronary artery bypass graft(s), unspecified, with unstable angina pectoris: Secondary | ICD-10-CM | POA: Diagnosis not present

## 2017-09-19 DIAGNOSIS — N183 Chronic kidney disease, stage 3 (moderate): Secondary | ICD-10-CM | POA: Diagnosis not present

## 2017-09-19 DIAGNOSIS — I5032 Chronic diastolic (congestive) heart failure: Secondary | ICD-10-CM | POA: Diagnosis not present

## 2017-09-19 DIAGNOSIS — E1121 Type 2 diabetes mellitus with diabetic nephropathy: Secondary | ICD-10-CM | POA: Diagnosis not present

## 2017-09-19 DIAGNOSIS — I130 Hypertensive heart and chronic kidney disease with heart failure and stage 1 through stage 4 chronic kidney disease, or unspecified chronic kidney disease: Secondary | ICD-10-CM | POA: Diagnosis not present

## 2017-09-19 DIAGNOSIS — E1122 Type 2 diabetes mellitus with diabetic chronic kidney disease: Secondary | ICD-10-CM | POA: Diagnosis not present

## 2017-09-20 DIAGNOSIS — N183 Chronic kidney disease, stage 3 (moderate): Secondary | ICD-10-CM | POA: Diagnosis not present

## 2017-09-20 DIAGNOSIS — E1121 Type 2 diabetes mellitus with diabetic nephropathy: Secondary | ICD-10-CM | POA: Diagnosis not present

## 2017-09-20 DIAGNOSIS — I25700 Atherosclerosis of coronary artery bypass graft(s), unspecified, with unstable angina pectoris: Secondary | ICD-10-CM | POA: Diagnosis not present

## 2017-09-20 DIAGNOSIS — I130 Hypertensive heart and chronic kidney disease with heart failure and stage 1 through stage 4 chronic kidney disease, or unspecified chronic kidney disease: Secondary | ICD-10-CM | POA: Diagnosis not present

## 2017-09-20 DIAGNOSIS — E1122 Type 2 diabetes mellitus with diabetic chronic kidney disease: Secondary | ICD-10-CM | POA: Diagnosis not present

## 2017-09-20 DIAGNOSIS — I5032 Chronic diastolic (congestive) heart failure: Secondary | ICD-10-CM | POA: Diagnosis not present

## 2017-09-21 DIAGNOSIS — E1121 Type 2 diabetes mellitus with diabetic nephropathy: Secondary | ICD-10-CM | POA: Diagnosis not present

## 2017-09-21 DIAGNOSIS — I5032 Chronic diastolic (congestive) heart failure: Secondary | ICD-10-CM | POA: Diagnosis not present

## 2017-09-21 DIAGNOSIS — I130 Hypertensive heart and chronic kidney disease with heart failure and stage 1 through stage 4 chronic kidney disease, or unspecified chronic kidney disease: Secondary | ICD-10-CM | POA: Diagnosis not present

## 2017-09-21 DIAGNOSIS — N183 Chronic kidney disease, stage 3 (moderate): Secondary | ICD-10-CM | POA: Diagnosis not present

## 2017-09-21 DIAGNOSIS — I25700 Atherosclerosis of coronary artery bypass graft(s), unspecified, with unstable angina pectoris: Secondary | ICD-10-CM | POA: Diagnosis not present

## 2017-09-21 DIAGNOSIS — E1122 Type 2 diabetes mellitus with diabetic chronic kidney disease: Secondary | ICD-10-CM | POA: Diagnosis not present

## 2017-09-22 DIAGNOSIS — N183 Chronic kidney disease, stage 3 (moderate): Secondary | ICD-10-CM | POA: Diagnosis not present

## 2017-09-22 DIAGNOSIS — E1122 Type 2 diabetes mellitus with diabetic chronic kidney disease: Secondary | ICD-10-CM | POA: Diagnosis not present

## 2017-09-22 DIAGNOSIS — I130 Hypertensive heart and chronic kidney disease with heart failure and stage 1 through stage 4 chronic kidney disease, or unspecified chronic kidney disease: Secondary | ICD-10-CM | POA: Diagnosis not present

## 2017-09-22 DIAGNOSIS — E1121 Type 2 diabetes mellitus with diabetic nephropathy: Secondary | ICD-10-CM | POA: Diagnosis not present

## 2017-09-22 DIAGNOSIS — I25700 Atherosclerosis of coronary artery bypass graft(s), unspecified, with unstable angina pectoris: Secondary | ICD-10-CM | POA: Diagnosis not present

## 2017-09-22 DIAGNOSIS — I5032 Chronic diastolic (congestive) heart failure: Secondary | ICD-10-CM | POA: Diagnosis not present

## 2017-09-23 DIAGNOSIS — E1122 Type 2 diabetes mellitus with diabetic chronic kidney disease: Secondary | ICD-10-CM | POA: Diagnosis not present

## 2017-09-23 DIAGNOSIS — E1121 Type 2 diabetes mellitus with diabetic nephropathy: Secondary | ICD-10-CM | POA: Diagnosis not present

## 2017-09-23 DIAGNOSIS — I130 Hypertensive heart and chronic kidney disease with heart failure and stage 1 through stage 4 chronic kidney disease, or unspecified chronic kidney disease: Secondary | ICD-10-CM | POA: Diagnosis not present

## 2017-09-23 DIAGNOSIS — I5032 Chronic diastolic (congestive) heart failure: Secondary | ICD-10-CM | POA: Diagnosis not present

## 2017-09-23 DIAGNOSIS — I25700 Atherosclerosis of coronary artery bypass graft(s), unspecified, with unstable angina pectoris: Secondary | ICD-10-CM | POA: Diagnosis not present

## 2017-09-23 DIAGNOSIS — N183 Chronic kidney disease, stage 3 (moderate): Secondary | ICD-10-CM | POA: Diagnosis not present

## 2017-09-26 DIAGNOSIS — I25700 Atherosclerosis of coronary artery bypass graft(s), unspecified, with unstable angina pectoris: Secondary | ICD-10-CM | POA: Diagnosis not present

## 2017-09-26 DIAGNOSIS — N183 Chronic kidney disease, stage 3 (moderate): Secondary | ICD-10-CM | POA: Diagnosis not present

## 2017-09-26 DIAGNOSIS — I130 Hypertensive heart and chronic kidney disease with heart failure and stage 1 through stage 4 chronic kidney disease, or unspecified chronic kidney disease: Secondary | ICD-10-CM | POA: Diagnosis not present

## 2017-09-26 DIAGNOSIS — E1121 Type 2 diabetes mellitus with diabetic nephropathy: Secondary | ICD-10-CM | POA: Diagnosis not present

## 2017-09-26 DIAGNOSIS — I5032 Chronic diastolic (congestive) heart failure: Secondary | ICD-10-CM | POA: Diagnosis not present

## 2017-09-26 DIAGNOSIS — E1122 Type 2 diabetes mellitus with diabetic chronic kidney disease: Secondary | ICD-10-CM | POA: Diagnosis not present

## 2017-09-27 DIAGNOSIS — I25700 Atherosclerosis of coronary artery bypass graft(s), unspecified, with unstable angina pectoris: Secondary | ICD-10-CM | POA: Diagnosis not present

## 2017-09-27 DIAGNOSIS — E1122 Type 2 diabetes mellitus with diabetic chronic kidney disease: Secondary | ICD-10-CM | POA: Diagnosis not present

## 2017-09-27 DIAGNOSIS — N183 Chronic kidney disease, stage 3 (moderate): Secondary | ICD-10-CM | POA: Diagnosis not present

## 2017-09-27 DIAGNOSIS — I130 Hypertensive heart and chronic kidney disease with heart failure and stage 1 through stage 4 chronic kidney disease, or unspecified chronic kidney disease: Secondary | ICD-10-CM | POA: Diagnosis not present

## 2017-09-27 DIAGNOSIS — E1121 Type 2 diabetes mellitus with diabetic nephropathy: Secondary | ICD-10-CM | POA: Diagnosis not present

## 2017-09-27 DIAGNOSIS — I5032 Chronic diastolic (congestive) heart failure: Secondary | ICD-10-CM | POA: Diagnosis not present

## 2017-09-28 DIAGNOSIS — I25700 Atherosclerosis of coronary artery bypass graft(s), unspecified, with unstable angina pectoris: Secondary | ICD-10-CM | POA: Diagnosis not present

## 2017-09-28 DIAGNOSIS — I130 Hypertensive heart and chronic kidney disease with heart failure and stage 1 through stage 4 chronic kidney disease, or unspecified chronic kidney disease: Secondary | ICD-10-CM | POA: Diagnosis not present

## 2017-09-28 DIAGNOSIS — E1122 Type 2 diabetes mellitus with diabetic chronic kidney disease: Secondary | ICD-10-CM | POA: Diagnosis not present

## 2017-09-28 DIAGNOSIS — E1121 Type 2 diabetes mellitus with diabetic nephropathy: Secondary | ICD-10-CM | POA: Diagnosis not present

## 2017-09-28 DIAGNOSIS — I5032 Chronic diastolic (congestive) heart failure: Secondary | ICD-10-CM | POA: Diagnosis not present

## 2017-09-28 DIAGNOSIS — N183 Chronic kidney disease, stage 3 (moderate): Secondary | ICD-10-CM | POA: Diagnosis not present

## 2017-09-30 ENCOUNTER — Other Ambulatory Visit: Payer: Self-pay | Admitting: Family Medicine

## 2017-09-30 DIAGNOSIS — I25700 Atherosclerosis of coronary artery bypass graft(s), unspecified, with unstable angina pectoris: Secondary | ICD-10-CM | POA: Diagnosis not present

## 2017-09-30 DIAGNOSIS — I5032 Chronic diastolic (congestive) heart failure: Secondary | ICD-10-CM | POA: Diagnosis not present

## 2017-09-30 DIAGNOSIS — E1121 Type 2 diabetes mellitus with diabetic nephropathy: Secondary | ICD-10-CM | POA: Diagnosis not present

## 2017-09-30 DIAGNOSIS — E1122 Type 2 diabetes mellitus with diabetic chronic kidney disease: Secondary | ICD-10-CM | POA: Diagnosis not present

## 2017-09-30 DIAGNOSIS — I130 Hypertensive heart and chronic kidney disease with heart failure and stage 1 through stage 4 chronic kidney disease, or unspecified chronic kidney disease: Secondary | ICD-10-CM | POA: Diagnosis not present

## 2017-09-30 DIAGNOSIS — N183 Chronic kidney disease, stage 3 (moderate): Secondary | ICD-10-CM | POA: Diagnosis not present

## 2017-09-30 NOTE — Telephone Encounter (Signed)
Copied from [place]. Topic: Quick Communication - Rx Refill/Question >> Sep 30, 2017  2:22 PM Keene Breath wrote: Medication: LORazepam (ATIVAN) 0.5 MG tablet  Patient called to request a refill for the above medication.  CB#[phone]  Preferred Pharmacy (with phone number or street name): [facility], [place] [phone] (Phone) [phone] (Fax)

## 2017-09-30 NOTE — Telephone Encounter (Signed)
I spoke with Jan at [facility] and asked if she had cked with [facility] in [place] because lorazepam 0.5 mg was refilled # 30 x 3 on 08/19/17. Jan said she had not checked with [facility] for refill but she would. FYI to Dr Darnell Level.

## 2017-09-30 NOTE — Telephone Encounter (Signed)
lorazepam refill Last Refill:08/31/17 # 30 Last OV: 09/14/17 PCP: Dr Danise Mina Pharmacy: [facility] [place], [place]

## 2017-10-02 DIAGNOSIS — E1122 Type 2 diabetes mellitus with diabetic chronic kidney disease: Secondary | ICD-10-CM | POA: Diagnosis not present

## 2017-10-02 DIAGNOSIS — J9611 Chronic respiratory failure with hypoxia: Secondary | ICD-10-CM | POA: Diagnosis not present

## 2017-10-02 DIAGNOSIS — E1121 Type 2 diabetes mellitus with diabetic nephropathy: Secondary | ICD-10-CM | POA: Diagnosis not present

## 2017-10-02 DIAGNOSIS — Z947 Corneal transplant status: Secondary | ICD-10-CM | POA: Diagnosis not present

## 2017-10-02 DIAGNOSIS — Z741 Need for assistance with personal care: Secondary | ICD-10-CM | POA: Diagnosis not present

## 2017-10-02 DIAGNOSIS — Z9981 Dependence on supplemental oxygen: Secondary | ICD-10-CM | POA: Diagnosis not present

## 2017-10-02 DIAGNOSIS — E785 Hyperlipidemia, unspecified: Secondary | ICD-10-CM | POA: Diagnosis not present

## 2017-10-02 DIAGNOSIS — Z6821 Body mass index (BMI) 21.0-21.9, adult: Secondary | ICD-10-CM | POA: Diagnosis not present

## 2017-10-02 DIAGNOSIS — Z7902 Long term (current) use of antithrombotics/antiplatelets: Secondary | ICD-10-CM | POA: Diagnosis not present

## 2017-10-02 DIAGNOSIS — I130 Hypertensive heart and chronic kidney disease with heart failure and stage 1 through stage 4 chronic kidney disease, or unspecified chronic kidney disease: Secondary | ICD-10-CM | POA: Diagnosis not present

## 2017-10-02 DIAGNOSIS — Z7984 Long term (current) use of oral hypoglycemic drugs: Secondary | ICD-10-CM | POA: Diagnosis not present

## 2017-10-02 DIAGNOSIS — Z9181 History of falling: Secondary | ICD-10-CM | POA: Diagnosis not present

## 2017-10-02 DIAGNOSIS — I25700 Atherosclerosis of coronary artery bypass graft(s), unspecified, with unstable angina pectoris: Secondary | ICD-10-CM | POA: Diagnosis not present

## 2017-10-02 DIAGNOSIS — Z7901 Long term (current) use of anticoagulants: Secondary | ICD-10-CM | POA: Diagnosis not present

## 2017-10-02 DIAGNOSIS — N183 Chronic kidney disease, stage 3 (moderate): Secondary | ICD-10-CM | POA: Diagnosis not present

## 2017-10-02 DIAGNOSIS — R32 Unspecified urinary incontinence: Secondary | ICD-10-CM | POA: Diagnosis not present

## 2017-10-02 DIAGNOSIS — I5032 Chronic diastolic (congestive) heart failure: Secondary | ICD-10-CM | POA: Diagnosis not present

## 2017-10-03 DIAGNOSIS — I25700 Atherosclerosis of coronary artery bypass graft(s), unspecified, with unstable angina pectoris: Secondary | ICD-10-CM | POA: Diagnosis not present

## 2017-10-03 DIAGNOSIS — N183 Chronic kidney disease, stage 3 (moderate): Secondary | ICD-10-CM | POA: Diagnosis not present

## 2017-10-03 DIAGNOSIS — E1121 Type 2 diabetes mellitus with diabetic nephropathy: Secondary | ICD-10-CM | POA: Diagnosis not present

## 2017-10-03 DIAGNOSIS — E1122 Type 2 diabetes mellitus with diabetic chronic kidney disease: Secondary | ICD-10-CM | POA: Diagnosis not present

## 2017-10-03 DIAGNOSIS — I130 Hypertensive heart and chronic kidney disease with heart failure and stage 1 through stage 4 chronic kidney disease, or unspecified chronic kidney disease: Secondary | ICD-10-CM | POA: Diagnosis not present

## 2017-10-03 DIAGNOSIS — I5032 Chronic diastolic (congestive) heart failure: Secondary | ICD-10-CM | POA: Diagnosis not present

## 2017-10-04 DIAGNOSIS — I5032 Chronic diastolic (congestive) heart failure: Secondary | ICD-10-CM | POA: Diagnosis not present

## 2017-10-04 DIAGNOSIS — I130 Hypertensive heart and chronic kidney disease with heart failure and stage 1 through stage 4 chronic kidney disease, or unspecified chronic kidney disease: Secondary | ICD-10-CM | POA: Diagnosis not present

## 2017-10-04 DIAGNOSIS — E1122 Type 2 diabetes mellitus with diabetic chronic kidney disease: Secondary | ICD-10-CM | POA: Diagnosis not present

## 2017-10-04 DIAGNOSIS — E1121 Type 2 diabetes mellitus with diabetic nephropathy: Secondary | ICD-10-CM | POA: Diagnosis not present

## 2017-10-04 DIAGNOSIS — N183 Chronic kidney disease, stage 3 (moderate): Secondary | ICD-10-CM | POA: Diagnosis not present

## 2017-10-04 DIAGNOSIS — I25700 Atherosclerosis of coronary artery bypass graft(s), unspecified, with unstable angina pectoris: Secondary | ICD-10-CM | POA: Diagnosis not present

## 2017-10-05 DIAGNOSIS — I5032 Chronic diastolic (congestive) heart failure: Secondary | ICD-10-CM | POA: Diagnosis not present

## 2017-10-05 DIAGNOSIS — E1122 Type 2 diabetes mellitus with diabetic chronic kidney disease: Secondary | ICD-10-CM | POA: Diagnosis not present

## 2017-10-05 DIAGNOSIS — I25700 Atherosclerosis of coronary artery bypass graft(s), unspecified, with unstable angina pectoris: Secondary | ICD-10-CM | POA: Diagnosis not present

## 2017-10-05 DIAGNOSIS — N183 Chronic kidney disease, stage 3 (moderate): Secondary | ICD-10-CM | POA: Diagnosis not present

## 2017-10-05 DIAGNOSIS — I130 Hypertensive heart and chronic kidney disease with heart failure and stage 1 through stage 4 chronic kidney disease, or unspecified chronic kidney disease: Secondary | ICD-10-CM | POA: Diagnosis not present

## 2017-10-05 DIAGNOSIS — E1121 Type 2 diabetes mellitus with diabetic nephropathy: Secondary | ICD-10-CM | POA: Diagnosis not present

## 2017-10-05 NOTE — Telephone Encounter (Signed)
Also requesting refill of LORazepam (ATIVAN) 1 MG tablet

## 2017-10-05 NOTE — Addendum Note (Signed)
Addended by: Modena Nunnery on: 10/05/2017 12:30 PM   Modules accepted: Orders

## 2017-10-06 DIAGNOSIS — E1121 Type 2 diabetes mellitus with diabetic nephropathy: Secondary | ICD-10-CM | POA: Diagnosis not present

## 2017-10-06 DIAGNOSIS — I130 Hypertensive heart and chronic kidney disease with heart failure and stage 1 through stage 4 chronic kidney disease, or unspecified chronic kidney disease: Secondary | ICD-10-CM | POA: Diagnosis not present

## 2017-10-06 DIAGNOSIS — N183 Chronic kidney disease, stage 3 (moderate): Secondary | ICD-10-CM | POA: Diagnosis not present

## 2017-10-06 DIAGNOSIS — I5032 Chronic diastolic (congestive) heart failure: Secondary | ICD-10-CM | POA: Diagnosis not present

## 2017-10-06 DIAGNOSIS — E1122 Type 2 diabetes mellitus with diabetic chronic kidney disease: Secondary | ICD-10-CM | POA: Diagnosis not present

## 2017-10-06 DIAGNOSIS — I25700 Atherosclerosis of coronary artery bypass graft(s), unspecified, with unstable angina pectoris: Secondary | ICD-10-CM | POA: Diagnosis not present

## 2017-10-07 DIAGNOSIS — I130 Hypertensive heart and chronic kidney disease with heart failure and stage 1 through stage 4 chronic kidney disease, or unspecified chronic kidney disease: Secondary | ICD-10-CM | POA: Diagnosis not present

## 2017-10-07 DIAGNOSIS — E1121 Type 2 diabetes mellitus with diabetic nephropathy: Secondary | ICD-10-CM | POA: Diagnosis not present

## 2017-10-07 DIAGNOSIS — E1122 Type 2 diabetes mellitus with diabetic chronic kidney disease: Secondary | ICD-10-CM | POA: Diagnosis not present

## 2017-10-07 DIAGNOSIS — I5032 Chronic diastolic (congestive) heart failure: Secondary | ICD-10-CM | POA: Diagnosis not present

## 2017-10-07 DIAGNOSIS — N183 Chronic kidney disease, stage 3 (moderate): Secondary | ICD-10-CM | POA: Diagnosis not present

## 2017-10-07 DIAGNOSIS — I25700 Atherosclerosis of coronary artery bypass graft(s), unspecified, with unstable angina pectoris: Secondary | ICD-10-CM | POA: Diagnosis not present

## 2017-10-07 NOTE — Telephone Encounter (Addendum)
Jan is not working today and spoke with [employer] a med Designer, multimedia. Avette said pharmacy said there were no refills. I called [facility] spoke with Laurence Ferrari pharmacist and was told pt does have refills on lorazepam 1 mg and an refill was sent out on 10/06/17. I called Avette back to let her know. FYI to Dr Darnell Level.

## 2017-10-07 NOTE — Telephone Encounter (Signed)
plz touch base with Jan about this - should have enough refills until 12/2017

## 2017-10-10 DIAGNOSIS — N183 Chronic kidney disease, stage 3 (moderate): Secondary | ICD-10-CM | POA: Diagnosis not present

## 2017-10-10 DIAGNOSIS — E1122 Type 2 diabetes mellitus with diabetic chronic kidney disease: Secondary | ICD-10-CM | POA: Diagnosis not present

## 2017-10-10 DIAGNOSIS — I25700 Atherosclerosis of coronary artery bypass graft(s), unspecified, with unstable angina pectoris: Secondary | ICD-10-CM | POA: Diagnosis not present

## 2017-10-10 DIAGNOSIS — E1121 Type 2 diabetes mellitus with diabetic nephropathy: Secondary | ICD-10-CM | POA: Diagnosis not present

## 2017-10-10 DIAGNOSIS — I5032 Chronic diastolic (congestive) heart failure: Secondary | ICD-10-CM | POA: Diagnosis not present

## 2017-10-10 DIAGNOSIS — I130 Hypertensive heart and chronic kidney disease with heart failure and stage 1 through stage 4 chronic kidney disease, or unspecified chronic kidney disease: Secondary | ICD-10-CM | POA: Diagnosis not present

## 2017-10-11 DIAGNOSIS — N183 Chronic kidney disease, stage 3 (moderate): Secondary | ICD-10-CM | POA: Diagnosis not present

## 2017-10-11 DIAGNOSIS — I130 Hypertensive heart and chronic kidney disease with heart failure and stage 1 through stage 4 chronic kidney disease, or unspecified chronic kidney disease: Secondary | ICD-10-CM | POA: Diagnosis not present

## 2017-10-11 DIAGNOSIS — I5032 Chronic diastolic (congestive) heart failure: Secondary | ICD-10-CM | POA: Diagnosis not present

## 2017-10-11 DIAGNOSIS — I25700 Atherosclerosis of coronary artery bypass graft(s), unspecified, with unstable angina pectoris: Secondary | ICD-10-CM | POA: Diagnosis not present

## 2017-10-11 DIAGNOSIS — E1122 Type 2 diabetes mellitus with diabetic chronic kidney disease: Secondary | ICD-10-CM | POA: Diagnosis not present

## 2017-10-11 DIAGNOSIS — E1121 Type 2 diabetes mellitus with diabetic nephropathy: Secondary | ICD-10-CM | POA: Diagnosis not present

## 2017-10-12 DIAGNOSIS — I5032 Chronic diastolic (congestive) heart failure: Secondary | ICD-10-CM | POA: Diagnosis not present

## 2017-10-12 DIAGNOSIS — I130 Hypertensive heart and chronic kidney disease with heart failure and stage 1 through stage 4 chronic kidney disease, or unspecified chronic kidney disease: Secondary | ICD-10-CM | POA: Diagnosis not present

## 2017-10-12 DIAGNOSIS — E1122 Type 2 diabetes mellitus with diabetic chronic kidney disease: Secondary | ICD-10-CM | POA: Diagnosis not present

## 2017-10-12 DIAGNOSIS — E1121 Type 2 diabetes mellitus with diabetic nephropathy: Secondary | ICD-10-CM | POA: Diagnosis not present

## 2017-10-12 DIAGNOSIS — N183 Chronic kidney disease, stage 3 (moderate): Secondary | ICD-10-CM | POA: Diagnosis not present

## 2017-10-12 DIAGNOSIS — I25700 Atherosclerosis of coronary artery bypass graft(s), unspecified, with unstable angina pectoris: Secondary | ICD-10-CM | POA: Diagnosis not present

## 2017-10-13 DIAGNOSIS — I130 Hypertensive heart and chronic kidney disease with heart failure and stage 1 through stage 4 chronic kidney disease, or unspecified chronic kidney disease: Secondary | ICD-10-CM | POA: Diagnosis not present

## 2017-10-13 DIAGNOSIS — N183 Chronic kidney disease, stage 3 (moderate): Secondary | ICD-10-CM | POA: Diagnosis not present

## 2017-10-13 DIAGNOSIS — E1121 Type 2 diabetes mellitus with diabetic nephropathy: Secondary | ICD-10-CM | POA: Diagnosis not present

## 2017-10-13 DIAGNOSIS — I25700 Atherosclerosis of coronary artery bypass graft(s), unspecified, with unstable angina pectoris: Secondary | ICD-10-CM | POA: Diagnosis not present

## 2017-10-13 DIAGNOSIS — I5032 Chronic diastolic (congestive) heart failure: Secondary | ICD-10-CM | POA: Diagnosis not present

## 2017-10-13 DIAGNOSIS — E1122 Type 2 diabetes mellitus with diabetic chronic kidney disease: Secondary | ICD-10-CM | POA: Diagnosis not present

## 2017-10-14 DIAGNOSIS — I25700 Atherosclerosis of coronary artery bypass graft(s), unspecified, with unstable angina pectoris: Secondary | ICD-10-CM | POA: Diagnosis not present

## 2017-10-14 DIAGNOSIS — E1121 Type 2 diabetes mellitus with diabetic nephropathy: Secondary | ICD-10-CM | POA: Diagnosis not present

## 2017-10-14 DIAGNOSIS — E1122 Type 2 diabetes mellitus with diabetic chronic kidney disease: Secondary | ICD-10-CM | POA: Diagnosis not present

## 2017-10-14 DIAGNOSIS — I130 Hypertensive heart and chronic kidney disease with heart failure and stage 1 through stage 4 chronic kidney disease, or unspecified chronic kidney disease: Secondary | ICD-10-CM | POA: Diagnosis not present

## 2017-10-14 DIAGNOSIS — I5032 Chronic diastolic (congestive) heart failure: Secondary | ICD-10-CM | POA: Diagnosis not present

## 2017-10-14 DIAGNOSIS — N183 Chronic kidney disease, stage 3 (moderate): Secondary | ICD-10-CM | POA: Diagnosis not present

## 2017-10-17 DIAGNOSIS — I130 Hypertensive heart and chronic kidney disease with heart failure and stage 1 through stage 4 chronic kidney disease, or unspecified chronic kidney disease: Secondary | ICD-10-CM | POA: Diagnosis not present

## 2017-10-17 DIAGNOSIS — E119 Type 2 diabetes mellitus without complications: Secondary | ICD-10-CM | POA: Diagnosis not present

## 2017-10-17 DIAGNOSIS — E1121 Type 2 diabetes mellitus with diabetic nephropathy: Secondary | ICD-10-CM | POA: Diagnosis not present

## 2017-10-17 DIAGNOSIS — N183 Chronic kidney disease, stage 3 (moderate): Secondary | ICD-10-CM | POA: Diagnosis not present

## 2017-10-17 DIAGNOSIS — I25700 Atherosclerosis of coronary artery bypass graft(s), unspecified, with unstable angina pectoris: Secondary | ICD-10-CM | POA: Diagnosis not present

## 2017-10-17 DIAGNOSIS — I5032 Chronic diastolic (congestive) heart failure: Secondary | ICD-10-CM | POA: Diagnosis not present

## 2017-10-17 DIAGNOSIS — E1122 Type 2 diabetes mellitus with diabetic chronic kidney disease: Secondary | ICD-10-CM | POA: Diagnosis not present

## 2017-10-17 LAB — HM DIABETES EYE EXAM

## 2017-10-18 DIAGNOSIS — N183 Chronic kidney disease, stage 3 (moderate): Secondary | ICD-10-CM | POA: Diagnosis not present

## 2017-10-18 DIAGNOSIS — E1121 Type 2 diabetes mellitus with diabetic nephropathy: Secondary | ICD-10-CM | POA: Diagnosis not present

## 2017-10-18 DIAGNOSIS — I25700 Atherosclerosis of coronary artery bypass graft(s), unspecified, with unstable angina pectoris: Secondary | ICD-10-CM | POA: Diagnosis not present

## 2017-10-18 DIAGNOSIS — E1122 Type 2 diabetes mellitus with diabetic chronic kidney disease: Secondary | ICD-10-CM | POA: Diagnosis not present

## 2017-10-18 DIAGNOSIS — I130 Hypertensive heart and chronic kidney disease with heart failure and stage 1 through stage 4 chronic kidney disease, or unspecified chronic kidney disease: Secondary | ICD-10-CM | POA: Diagnosis not present

## 2017-10-18 DIAGNOSIS — I5032 Chronic diastolic (congestive) heart failure: Secondary | ICD-10-CM | POA: Diagnosis not present

## 2017-10-19 DIAGNOSIS — I5032 Chronic diastolic (congestive) heart failure: Secondary | ICD-10-CM | POA: Diagnosis not present

## 2017-10-19 DIAGNOSIS — I25700 Atherosclerosis of coronary artery bypass graft(s), unspecified, with unstable angina pectoris: Secondary | ICD-10-CM | POA: Diagnosis not present

## 2017-10-19 DIAGNOSIS — N183 Chronic kidney disease, stage 3 (moderate): Secondary | ICD-10-CM | POA: Diagnosis not present

## 2017-10-19 DIAGNOSIS — I130 Hypertensive heart and chronic kidney disease with heart failure and stage 1 through stage 4 chronic kidney disease, or unspecified chronic kidney disease: Secondary | ICD-10-CM | POA: Diagnosis not present

## 2017-10-19 DIAGNOSIS — E1121 Type 2 diabetes mellitus with diabetic nephropathy: Secondary | ICD-10-CM | POA: Diagnosis not present

## 2017-10-19 DIAGNOSIS — E1122 Type 2 diabetes mellitus with diabetic chronic kidney disease: Secondary | ICD-10-CM | POA: Diagnosis not present

## 2017-10-20 DIAGNOSIS — E1121 Type 2 diabetes mellitus with diabetic nephropathy: Secondary | ICD-10-CM | POA: Diagnosis not present

## 2017-10-20 DIAGNOSIS — I130 Hypertensive heart and chronic kidney disease with heart failure and stage 1 through stage 4 chronic kidney disease, or unspecified chronic kidney disease: Secondary | ICD-10-CM | POA: Diagnosis not present

## 2017-10-20 DIAGNOSIS — I25700 Atherosclerosis of coronary artery bypass graft(s), unspecified, with unstable angina pectoris: Secondary | ICD-10-CM | POA: Diagnosis not present

## 2017-10-20 DIAGNOSIS — I5032 Chronic diastolic (congestive) heart failure: Secondary | ICD-10-CM | POA: Diagnosis not present

## 2017-10-20 DIAGNOSIS — E1122 Type 2 diabetes mellitus with diabetic chronic kidney disease: Secondary | ICD-10-CM | POA: Diagnosis not present

## 2017-10-20 DIAGNOSIS — N183 Chronic kidney disease, stage 3 (moderate): Secondary | ICD-10-CM | POA: Diagnosis not present

## 2017-10-20 IMAGING — DX DG CHEST 1V
1 series · 1 of 1 positions shown · non-contrast
Comparison: 11/01/2014

CLINICAL DATA: Fell last night, cough

EXAM:
CHEST 1 VIEW

[chest ap]
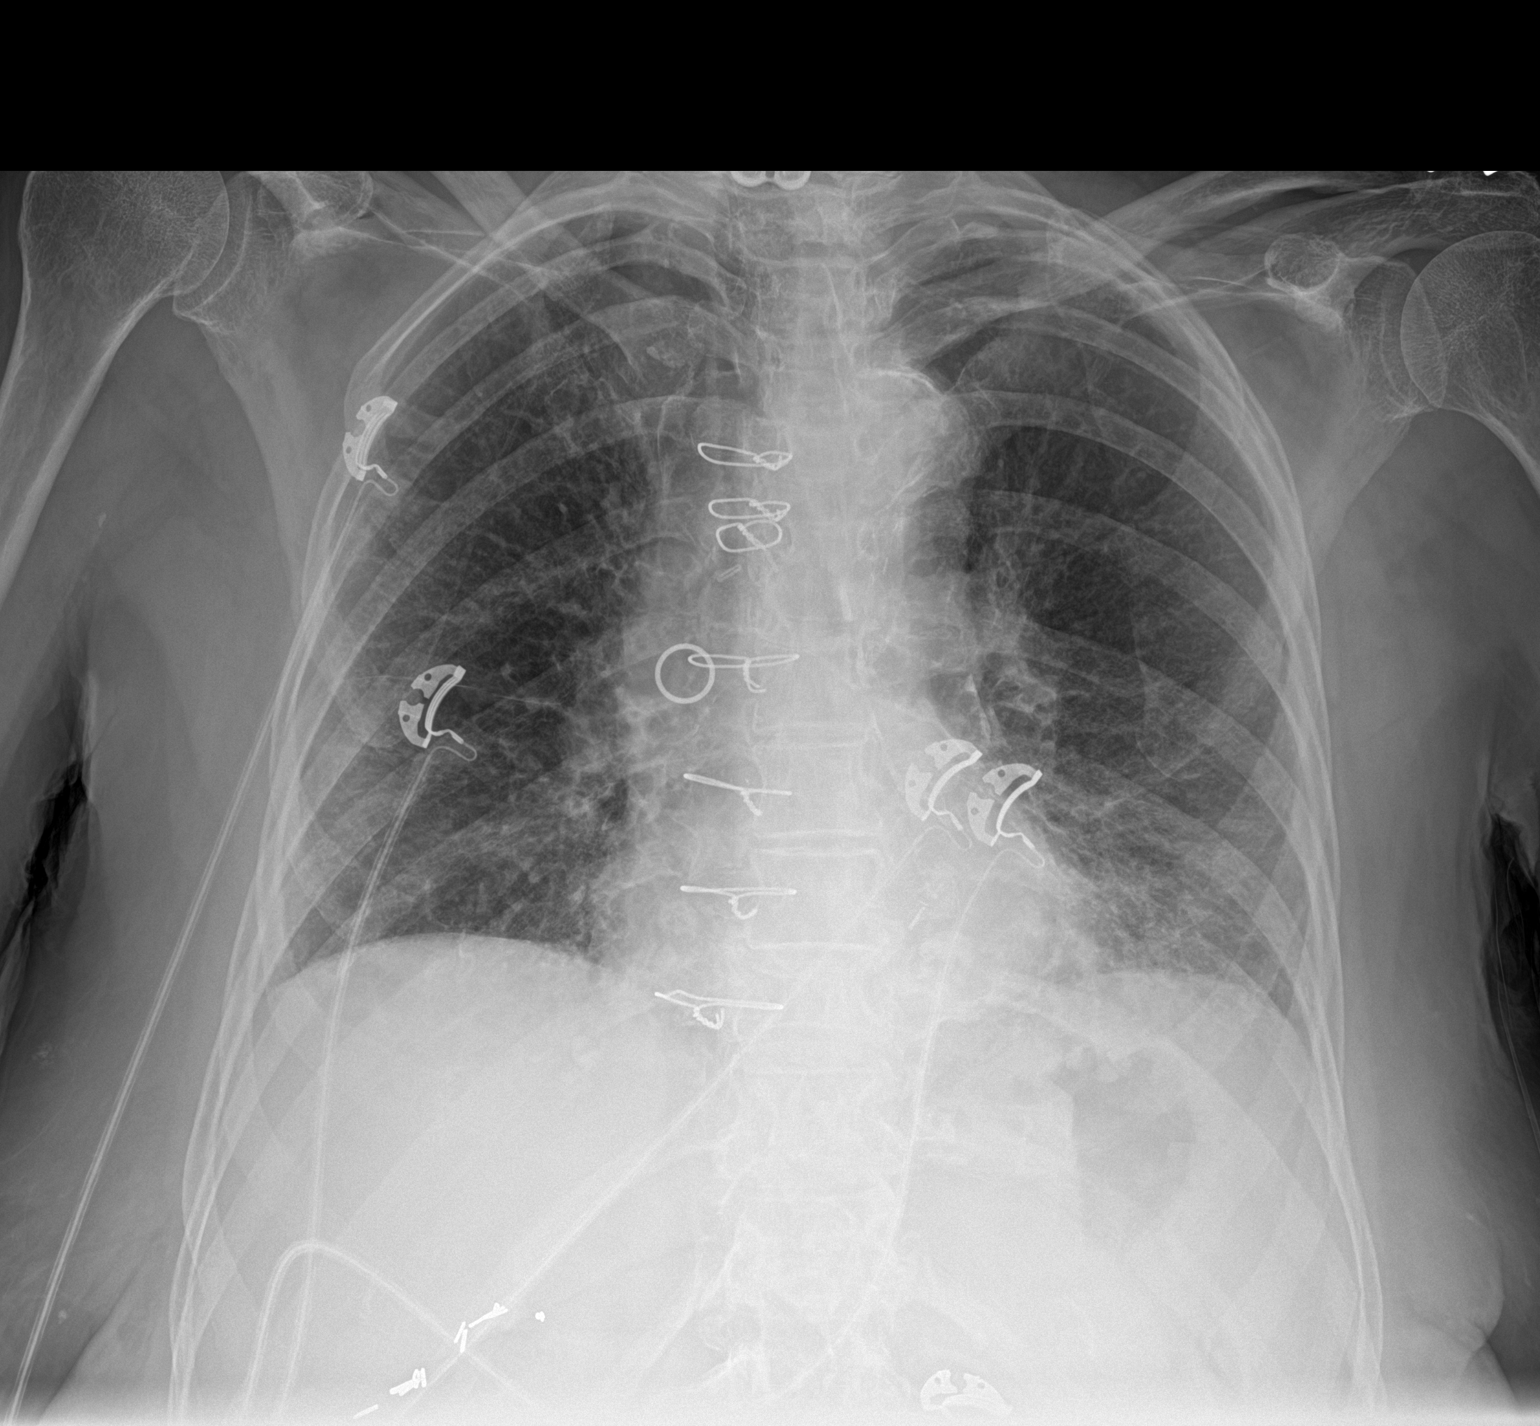

[1 of 1 positions shown; findings below may reference images not displayed]

FINDINGS: Lower cervical plate and screw fixator. Prior CABG. Heart size
within normal limits. Atherosclerotic calcification of the aortic
arch.

Interstitial accentuation peripherally in the lungs especially at
the left lung base, along with a small amount of airspace opacity
medially at the left lung base.
IMPRESSION: 1. Indistinct airspace opacity medially at the left lung base-early
pneumonia is not excluded.
2. Mild chronic interstitial accentuation peripherally.
3. Atherosclerotic aortic arch.

## 2017-10-21 DIAGNOSIS — E1122 Type 2 diabetes mellitus with diabetic chronic kidney disease: Secondary | ICD-10-CM | POA: Diagnosis not present

## 2017-10-21 DIAGNOSIS — N183 Chronic kidney disease, stage 3 (moderate): Secondary | ICD-10-CM | POA: Diagnosis not present

## 2017-10-21 DIAGNOSIS — I25700 Atherosclerosis of coronary artery bypass graft(s), unspecified, with unstable angina pectoris: Secondary | ICD-10-CM | POA: Diagnosis not present

## 2017-10-21 DIAGNOSIS — E1121 Type 2 diabetes mellitus with diabetic nephropathy: Secondary | ICD-10-CM | POA: Diagnosis not present

## 2017-10-21 DIAGNOSIS — I5032 Chronic diastolic (congestive) heart failure: Secondary | ICD-10-CM | POA: Diagnosis not present

## 2017-10-21 DIAGNOSIS — I130 Hypertensive heart and chronic kidney disease with heart failure and stage 1 through stage 4 chronic kidney disease, or unspecified chronic kidney disease: Secondary | ICD-10-CM | POA: Diagnosis not present

## 2017-10-24 ENCOUNTER — Encounter: Payer: Self-pay | Admitting: Family Medicine

## 2017-10-24 DIAGNOSIS — E1121 Type 2 diabetes mellitus with diabetic nephropathy: Secondary | ICD-10-CM | POA: Diagnosis not present

## 2017-10-24 DIAGNOSIS — I25700 Atherosclerosis of coronary artery bypass graft(s), unspecified, with unstable angina pectoris: Secondary | ICD-10-CM | POA: Diagnosis not present

## 2017-10-24 DIAGNOSIS — I5032 Chronic diastolic (congestive) heart failure: Secondary | ICD-10-CM | POA: Diagnosis not present

## 2017-10-24 DIAGNOSIS — E1122 Type 2 diabetes mellitus with diabetic chronic kidney disease: Secondary | ICD-10-CM | POA: Diagnosis not present

## 2017-10-24 DIAGNOSIS — I130 Hypertensive heart and chronic kidney disease with heart failure and stage 1 through stage 4 chronic kidney disease, or unspecified chronic kidney disease: Secondary | ICD-10-CM | POA: Diagnosis not present

## 2017-10-24 DIAGNOSIS — N183 Chronic kidney disease, stage 3 (moderate): Secondary | ICD-10-CM | POA: Diagnosis not present

## 2017-10-25 ENCOUNTER — Ambulatory Visit: Payer: Self-pay | Admitting: *Deleted

## 2017-10-25 DIAGNOSIS — E1121 Type 2 diabetes mellitus with diabetic nephropathy: Secondary | ICD-10-CM | POA: Diagnosis not present

## 2017-10-25 DIAGNOSIS — I130 Hypertensive heart and chronic kidney disease with heart failure and stage 1 through stage 4 chronic kidney disease, or unspecified chronic kidney disease: Secondary | ICD-10-CM | POA: Diagnosis not present

## 2017-10-25 DIAGNOSIS — N183 Chronic kidney disease, stage 3 (moderate): Secondary | ICD-10-CM | POA: Diagnosis not present

## 2017-10-25 DIAGNOSIS — E1122 Type 2 diabetes mellitus with diabetic chronic kidney disease: Secondary | ICD-10-CM | POA: Diagnosis not present

## 2017-10-25 DIAGNOSIS — I25700 Atherosclerosis of coronary artery bypass graft(s), unspecified, with unstable angina pectoris: Secondary | ICD-10-CM | POA: Diagnosis not present

## 2017-10-25 DIAGNOSIS — I5032 Chronic diastolic (congestive) heart failure: Secondary | ICD-10-CM | POA: Diagnosis not present

## 2017-10-25 NOTE — Telephone Encounter (Signed)
Noted. Will review at [facility].

## 2017-10-25 NOTE — Telephone Encounter (Signed)
Pt's daughter called with a question regarding her mom's Zoloft. Her mom told her that she thought this medication was making her a little confused. Per reference material, confusion is one of the side effects of Zoloft. Pt's daughter stated that her mom's dosage had been increased at the last visit.  She just wanted Dr. Danise Mina to be aware. Pt has an office visit on Friday with her pcp.  Reason for Disposition . Caller has medication question only, adult not sick, and triager answers question  Answer Assessment - Initial Assessment Questions 1. SYMPTOMS: "Do you have any symptoms?"     confusion 2. SEVERITY: If symptoms are present, ask "Are they mild, moderate or severe?"     mild  Protocols used: MEDICATION QUESTION CALL-A-AH

## 2017-10-26 ENCOUNTER — Other Ambulatory Visit: Payer: Self-pay | Admitting: Cardiovascular Disease

## 2017-10-26 DIAGNOSIS — E1121 Type 2 diabetes mellitus with diabetic nephropathy: Secondary | ICD-10-CM | POA: Diagnosis not present

## 2017-10-26 DIAGNOSIS — E1122 Type 2 diabetes mellitus with diabetic chronic kidney disease: Secondary | ICD-10-CM | POA: Diagnosis not present

## 2017-10-26 DIAGNOSIS — I25700 Atherosclerosis of coronary artery bypass graft(s), unspecified, with unstable angina pectoris: Secondary | ICD-10-CM | POA: Diagnosis not present

## 2017-10-26 DIAGNOSIS — N183 Chronic kidney disease, stage 3 (moderate): Secondary | ICD-10-CM | POA: Diagnosis not present

## 2017-10-26 DIAGNOSIS — I5032 Chronic diastolic (congestive) heart failure: Secondary | ICD-10-CM | POA: Diagnosis not present

## 2017-10-26 DIAGNOSIS — I130 Hypertensive heart and chronic kidney disease with heart failure and stage 1 through stage 4 chronic kidney disease, or unspecified chronic kidney disease: Secondary | ICD-10-CM | POA: Diagnosis not present

## 2017-10-27 ENCOUNTER — Telehealth: Payer: Self-pay | Admitting: Cardiovascular Disease

## 2017-10-27 DIAGNOSIS — I130 Hypertensive heart and chronic kidney disease with heart failure and stage 1 through stage 4 chronic kidney disease, or unspecified chronic kidney disease: Secondary | ICD-10-CM | POA: Diagnosis not present

## 2017-10-27 DIAGNOSIS — I5032 Chronic diastolic (congestive) heart failure: Secondary | ICD-10-CM | POA: Diagnosis not present

## 2017-10-27 DIAGNOSIS — E1122 Type 2 diabetes mellitus with diabetic chronic kidney disease: Secondary | ICD-10-CM | POA: Diagnosis not present

## 2017-10-27 DIAGNOSIS — I25700 Atherosclerosis of coronary artery bypass graft(s), unspecified, with unstable angina pectoris: Secondary | ICD-10-CM | POA: Diagnosis not present

## 2017-10-27 DIAGNOSIS — N183 Chronic kidney disease, stage 3 (moderate): Secondary | ICD-10-CM | POA: Diagnosis not present

## 2017-10-27 DIAGNOSIS — E1121 Type 2 diabetes mellitus with diabetic nephropathy: Secondary | ICD-10-CM | POA: Diagnosis not present

## 2017-10-27 NOTE — Telephone Encounter (Signed)
-----   Message from Anselm Pancoast, Merlin sent at 10/26/2017  9:26 AM EDT ----- Please contact patient for a follow up appointment.  The patient requesting refills on medications.  She was to come back in Oct. 2018. Thanks, Ivin Booty

## 2017-10-27 NOTE — Telephone Encounter (Signed)
Lmov for patient to call and schedule °

## 2017-10-28 ENCOUNTER — Encounter: Payer: Self-pay | Admitting: Family Medicine

## 2017-10-28 ENCOUNTER — Ambulatory Visit (INDEPENDENT_AMBULATORY_CARE_PROVIDER_SITE_OTHER): Payer: Medicare Other | Admitting: Family Medicine

## 2017-10-28 VITALS — BP 118/62 | HR 62 | Temp 98.2°F | Ht 65.0 in

## 2017-10-28 DIAGNOSIS — I5032 Chronic diastolic (congestive) heart failure: Secondary | ICD-10-CM | POA: Diagnosis not present

## 2017-10-28 DIAGNOSIS — I25700 Atherosclerosis of coronary artery bypass graft(s), unspecified, with unstable angina pectoris: Secondary | ICD-10-CM | POA: Diagnosis not present

## 2017-10-28 DIAGNOSIS — L989 Disorder of the skin and subcutaneous tissue, unspecified: Secondary | ICD-10-CM

## 2017-10-28 DIAGNOSIS — N183 Chronic kidney disease, stage 3 (moderate): Secondary | ICD-10-CM | POA: Diagnosis not present

## 2017-10-28 DIAGNOSIS — I130 Hypertensive heart and chronic kidney disease with heart failure and stage 1 through stage 4 chronic kidney disease, or unspecified chronic kidney disease: Secondary | ICD-10-CM | POA: Diagnosis not present

## 2017-10-28 DIAGNOSIS — Z23 Encounter for immunization: Secondary | ICD-10-CM | POA: Diagnosis not present

## 2017-10-28 DIAGNOSIS — S40022A Contusion of left upper arm, initial encounter: Secondary | ICD-10-CM | POA: Diagnosis not present

## 2017-10-28 DIAGNOSIS — E1122 Type 2 diabetes mellitus with diabetic chronic kidney disease: Secondary | ICD-10-CM | POA: Diagnosis not present

## 2017-10-28 DIAGNOSIS — F331 Major depressive disorder, recurrent, moderate: Secondary | ICD-10-CM

## 2017-10-28 DIAGNOSIS — E1121 Type 2 diabetes mellitus with diabetic nephropathy: Secondary | ICD-10-CM | POA: Diagnosis not present

## 2017-10-28 MED ORDER — DOXYCYCLINE HYCLATE 100 MG PO TABS: 100.0000 mg | ORAL_TABLET | Freq: Two times a day (BID) | ORAL | 0 refills | Status: DC

## 2017-10-28 NOTE — Progress Notes (Signed)
BP 118/62 (BP Location: Left Arm, Patient Position: Sitting, Cuff Size: Normal)   Pulse 62   Temp 98.2 F (36.8 C) (Oral)   Ht _0  (1.651 m)   SpO2 98% Comment: 2 L, pulsating  BMI 21.72 kg/m    CC: axillary lumps  Subjective:    Patient ID: Christine Blanchard, female    DOB: [birth year], 84 y.o.   MRN: [id number]  HPI: Christine Blanchard is a 84 y.o. female presenting on 10/28/2017 for Mass (C/o lumps in bilateral axillary area about 2 wks ago. Pt squeezed cyst under left arm, area popped and drained. Stopped using deodorant and applying alcohol. Pt is accompanied by her daughter. )   Here with daughter [name] today.  2 wk h/o axillary lumps. Areas are tender. One area drained.  She has stopped using deodorant - was using mitchum deodorant which isn't highly fragranced. She has been using dilute alcohol.  Daughter worried recent increase in zoloft to 59m daily may be causing some confusion.   Recent fall against rocker with resultant large bruise to LUE.   May get someone to sit with her 8-10pm.   Relevant past medical, surgical, family and social history reviewed and updated as indicated. Interim medical history since our last visit reviewed. Allergies and medications reviewed and updated. Outpatient Medications Prior to Visit  Medication Sig Dispense Refill  . acetaminophen (TYLENOL) 500 MG tablet Take 500 mg by mouth 3 (three) times daily.    . ASPIRIN LOW DOSE 81 MG chewable tablet TAKE 1 TABLET BY MOUTH ONCE DAILY 30 tablet 6  . atorvastatin (LIPITOR) 20 MG tablet TAKE 1 TABLET NIGHTLY 90 tablet 2  . BD AUTOSHIELD DUO 30G X 5 MM MISC Use as instructed with insulin Dx: E11.21 100 each 3  . cetirizine (ZYRTEC) 10 MG tablet Take 1 tablet (10 mg total) by mouth daily as needed for allergies. 30 tablet 5  . clopidogrel (PLAVIX) 75 MG tablet TAKE 1 TABLET DAILY 90 tablet 3  . docusate sodium (COLACE) 100 MG capsule Take 1 capsule (100 mg total) by mouth daily. (Patient taking  differently: Take 100 mg by mouth daily. Taking 1 tablet twice a day) 90 capsule 1  . glucose blood (FREESTYLE LITE) test strip Use to check sugar three times daily Dx: 250.40 100 each 3  . glucose monitoring kit (FREESTYLE) monitoring kit 1 each by Does not apply route as needed for other. 1 each 0  . isosorbide mononitrate (IMDUR) 60 MG 24 hr tablet TAKE 1 TABLET AT BEDTIME 90 tablet 1  . KLOR-CON M10 10 MEQ tablet TAKE 2 TABLETS DAILY 180 tablet 1  . Lancets (FREESTYLE) lancets USE TO CHECK SUGAR THREE TIMES A DAY 300 each 2  . LANTUS SOLOSTAR 100 UNIT/ML Solostar Pen INJECT 10 UNITS UNDER THE SKIN DAILY WITH BREAKFAST (DIRECTION CHANGE) 15 mL 6  . LORazepam (ATIVAN) 0.5 MG tablet Take 1 tablet (0.5 mg total) by mouth daily. At noon 30 tablet 3  . LORazepam (ATIVAN) 1 MG tablet Take 1 tablet (1 mg total) by mouth at bedtime. At 10pm 30 tablet 3  . meclizine (ANTIVERT) 12.5 MG tablet One tablet by mouth daily as needed for vertigo 30 tablet 0  . metoprolol tartrate (LOPRESSOR) 25 MG tablet TAKE 1 TABLET TWICE A DAY 180 tablet 3  . Multiple Vitamin (MULTIVITAMIN) capsule Take 1 capsule by mouth daily.      . nitroGLYCERIN (NITROSTAT) 0.4 MG SL tablet DISSOLVE 1 TABLET UNDER THE  TONGUE AS NEEDED 25 tablet 2  . ondansetron (ZOFRAN) 4 MG tablet Take 1 tablet (4 mg total) by mouth 2 (two) times daily as needed for nausea or vomiting. 30 tablet 1  . pantoprazole (PROTONIX) 40 MG tablet TAKE 1 TABLET DAILY 90 tablet 1  . prednisoLONE sodium phosphate (INFLAMASE FORTE) 1 % ophthalmic solution Place 1 drop into both eyes daily. 5 mL 3  . ranitidine (ZANTAC) 150 MG tablet Take 1 tablet (150 mg total) by mouth at bedtime. 90 tablet 1  . sertraline (ZOLOFT) 50 MG tablet Take 1.5 tablets (75 mg total) by mouth daily. 135 tablet 0  . torsemide (DEMADEX) 20 MG tablet TAKE 2 TABLETS EVERY MORNING AND 1 EXTRA TABLET ON MONDAY, WEDNESDAY, AND FRIDAY IF NEEDED 225 tablet 1  . traMADol (ULTRAM) 50 MG tablet TAKE  ONE TABLET BY MOUTH 2 TIMES A DAY AT 3PM AND 10PM 60 tablet 3  . traZODone (DESYREL) 50 MG tablet Take 0.5 tablets (25 mg total) by mouth at bedtime. 90 tablet 3  . Vitamin D, Ergocalciferol, (DRISDOL) 50000 units CAPS capsule TAKE 1 CAPSULE EVERY WEEK 12 capsule 1   No facility-administered medications prior to visit.      Per HPI unless specifically indicated in ROS section below Review of Systems     Objective:    BP 118/62 (BP Location: Left Arm, Patient Position: Sitting, Cuff Size: Normal)   Pulse 62   Temp 98.2 F (36.8 C) (Oral)   Ht _0  (1.651 m)   SpO2 98% Comment: 2 L, pulsating  BMI 21.72 kg/m   Wt Readings from Last 3 Encounters:  09/14/17 130 lb 8 oz (59.2 kg)  05/13/17 133 lb 8 oz (60.6 kg)  01/20/17 136 lb 4 oz (61.8 kg)    Physical Exam  Constitutional: She appears well-developed and well-nourished. No distress.  Musculoskeletal: She exhibits no edema.  Skin: Skin is warm and dry. Ecchymosis noted. No erythema.     Large ecchymosis to LUE after fall/injury Nodules at skin of axilla L>R irritation Small pustule inferior to axilla on left  Nursing note and vitals reviewed.  Results for orders placed or performed in visit on 10/24/17  HM DIABETES EYE EXAM  Result Value Ref Range   HM Diabetic Eye Exam No Retinopathy No Retinopathy      Assessment & Plan:   Problem List Items Addressed This Visit    Traumatic ecchymosis of left upper arm    No pain at humerus - not consistent with fracture. Monitor for improvement.      Skin lesions - Primary    At bilateral axilla, L>R irritated. Unclear cause. Too superficial for lymph nodes. Not consistent with boils. ?scarred nodules vs irritated seborrheic keratoses. Will monitor for now, recommended warm compresses to area. Update if enlarging or more bothersome.  Possible folliculitis component - will Rx 5d doxy course, consider hibiclens.       MDD (major depressive disorder), recurrent episode, moderate  (HCC)    Stable period on sertraline 76m daily. I don't think higher SSRI dose contributing to any confusion. Will continue this along with lorazepam 0.566m/75m46maily.       Other Visit Diagnoses    Need for influenza vaccination       Relevant Orders   Flu Vaccine QUAD 36+ mos IM (Completed)       Meds ordered this encounter  Medications  . doxycycline (VIBRA-TABS) 100 MG tablet    Sig: Take 1 tablet (100  mg total) by mouth 2 (two) times daily.    Dispense:  10 tablet    Refill:  0   Orders Placed This Encounter  Procedures  . Flu Vaccine QUAD 36+ mos IM    Follow up plan: Return if symptoms worsen or fail to improve.  Ria Bush, MD

## 2017-10-28 NOTE — Patient Instructions (Addendum)
Flu shot today Possible folliculitis Start doxycycline twice daily for 5 days.  Continue warm compresses twice daily.  Update me with effect.

## 2017-10-30 DIAGNOSIS — L989 Disorder of the skin and subcutaneous tissue, unspecified: Secondary | ICD-10-CM | POA: Insufficient documentation

## 2017-10-30 DIAGNOSIS — S40022A Contusion of left upper arm, initial encounter: Secondary | ICD-10-CM | POA: Insufficient documentation

## 2017-10-30 NOTE — Assessment & Plan Note (Signed)
No pain at humerus - not consistent with fracture. Monitor for improvement.

## 2017-10-30 NOTE — Assessment & Plan Note (Signed)
Stable period on sertraline 75mg  daily. I don't think higher SSRI dose contributing to any confusion. Will continue this along with lorazepam 0.5mg  /1mg  daily.

## 2017-10-30 NOTE — Assessment & Plan Note (Addendum)
At bilateral axilla, L>R irritated. Unclear cause. Too superficial for lymph nodes. Not consistent with boils. ?scarred nodules vs irritated seborrheic keratoses. Will monitor for now, recommended warm compresses to area. Update if enlarging or more bothersome.  Possible folliculitis component - will Rx 5d doxy course, consider hibiclens.

## 2017-10-31 ENCOUNTER — Telehealth: Payer: Self-pay

## 2017-10-31 DIAGNOSIS — I5032 Chronic diastolic (congestive) heart failure: Secondary | ICD-10-CM | POA: Diagnosis not present

## 2017-10-31 DIAGNOSIS — E1122 Type 2 diabetes mellitus with diabetic chronic kidney disease: Secondary | ICD-10-CM | POA: Diagnosis not present

## 2017-10-31 DIAGNOSIS — E1121 Type 2 diabetes mellitus with diabetic nephropathy: Secondary | ICD-10-CM | POA: Diagnosis not present

## 2017-10-31 DIAGNOSIS — I130 Hypertensive heart and chronic kidney disease with heart failure and stage 1 through stage 4 chronic kidney disease, or unspecified chronic kidney disease: Secondary | ICD-10-CM | POA: Diagnosis not present

## 2017-10-31 DIAGNOSIS — I25700 Atherosclerosis of coronary artery bypass graft(s), unspecified, with unstable angina pectoris: Secondary | ICD-10-CM | POA: Diagnosis not present

## 2017-10-31 DIAGNOSIS — N183 Chronic kidney disease, stage 3 (moderate): Secondary | ICD-10-CM | POA: Diagnosis not present

## 2017-10-31 MED ORDER — CEPHALEXIN 500 MG PO CAPS: 500.0000 mg | ORAL_CAPSULE | Freq: Three times a day (TID) | ORAL | 0 refills | Status: DC

## 2017-10-31 MED ORDER — CLOTRIMAZOLE 1 % EX CREA: 1.0000 "application " | TOPICAL_CREAM | Freq: Two times a day (BID) | CUTANEOUS | 0 refills | Status: AC

## 2017-10-31 NOTE — Telephone Encounter (Signed)
Ok - if she hasn't started this yet, don't take doxy. Instead take keflex sent to pharmacy.  Lab Results  Component Value Date   CREATININE 0.87 05/13/2017

## 2017-10-31 NOTE — Telephone Encounter (Signed)
Copied from [place] [id number]. Topic: General - Other >> Oct 28, 2017  5:07 PM Yvette Rack wrote: Reason for CRM: Stacey with [facility] state the last time pt was on doxycycline (VIBRA-TABS) 100 MG tablet she experienced severe diarrhea. Cb# [phone]

## 2017-10-31 NOTE — Telephone Encounter (Signed)
Lmov for patient to call and schedule °

## 2017-10-31 NOTE — Addendum Note (Signed)
Addended by: Ria Bush on: 10/31/2017 05:50 PM   Modules accepted: Orders

## 2017-10-31 NOTE — Telephone Encounter (Signed)
Spoke to Christine Blanchard by telephone and was advised that patient started the Doxy Saturday and has had 4 doses and has not had any diarrhea so far. [name] stated that patient has yeast under her breast and had this before starting the antibiotic, but has gotten worse since starting Doxy. [name] stated that the patient has been using the powder, but that is not helping with the problem and that the yeast is now on her bottom.

## 2017-10-31 NOTE — Telephone Encounter (Signed)
Finish doxy. Cancel keflex sent to pharmacy. For yeast - add clotrimazole cream sent to pharmacy - use twice daily.

## 2017-11-01 DIAGNOSIS — I5032 Chronic diastolic (congestive) heart failure: Secondary | ICD-10-CM | POA: Diagnosis not present

## 2017-11-01 DIAGNOSIS — E785 Hyperlipidemia, unspecified: Secondary | ICD-10-CM | POA: Diagnosis not present

## 2017-11-01 DIAGNOSIS — Z741 Need for assistance with personal care: Secondary | ICD-10-CM | POA: Diagnosis not present

## 2017-11-01 DIAGNOSIS — J9611 Chronic respiratory failure with hypoxia: Secondary | ICD-10-CM | POA: Diagnosis not present

## 2017-11-01 DIAGNOSIS — I25700 Atherosclerosis of coronary artery bypass graft(s), unspecified, with unstable angina pectoris: Secondary | ICD-10-CM | POA: Diagnosis not present

## 2017-11-01 DIAGNOSIS — I130 Hypertensive heart and chronic kidney disease with heart failure and stage 1 through stage 4 chronic kidney disease, or unspecified chronic kidney disease: Secondary | ICD-10-CM | POA: Diagnosis not present

## 2017-11-01 DIAGNOSIS — Z9981 Dependence on supplemental oxygen: Secondary | ICD-10-CM | POA: Diagnosis not present

## 2017-11-01 DIAGNOSIS — E1122 Type 2 diabetes mellitus with diabetic chronic kidney disease: Secondary | ICD-10-CM | POA: Diagnosis not present

## 2017-11-01 DIAGNOSIS — E1121 Type 2 diabetes mellitus with diabetic nephropathy: Secondary | ICD-10-CM | POA: Diagnosis not present

## 2017-11-01 DIAGNOSIS — Z9181 History of falling: Secondary | ICD-10-CM | POA: Diagnosis not present

## 2017-11-01 DIAGNOSIS — Z7902 Long term (current) use of antithrombotics/antiplatelets: Secondary | ICD-10-CM | POA: Diagnosis not present

## 2017-11-01 DIAGNOSIS — N183 Chronic kidney disease, stage 3 (moderate): Secondary | ICD-10-CM | POA: Diagnosis not present

## 2017-11-01 DIAGNOSIS — Z947 Corneal transplant status: Secondary | ICD-10-CM | POA: Diagnosis not present

## 2017-11-01 DIAGNOSIS — Z7984 Long term (current) use of oral hypoglycemic drugs: Secondary | ICD-10-CM | POA: Diagnosis not present

## 2017-11-01 DIAGNOSIS — R32 Unspecified urinary incontinence: Secondary | ICD-10-CM | POA: Diagnosis not present

## 2017-11-01 DIAGNOSIS — Z7901 Long term (current) use of anticoagulants: Secondary | ICD-10-CM | POA: Diagnosis not present

## 2017-11-01 NOTE — Telephone Encounter (Signed)
Stacy from Hospice calling and needs clarification on orders.  Marzetta Board can be reached at [phone].

## 2017-11-01 NOTE — Telephone Encounter (Signed)
Noted  

## 2017-11-01 NOTE — Telephone Encounter (Signed)
Lmov for patient to call and schedule °

## 2017-11-01 NOTE — Telephone Encounter (Signed)
Stacy with Hospice notified as instructed and voiced understanding and Marzetta Board will let Dottie Vaquerano be aware of changes. I spoke with Cyril Mourning at [facility] she said [facility] would service this pt and transferred me to [employer]. I spoke with [place] at [facility] and Keflex was d/c and pharmacy will send clotrimazole cream. Caryl Comes did say that [facility] does service I[facility].

## 2017-11-02 DIAGNOSIS — N183 Chronic kidney disease, stage 3 (moderate): Secondary | ICD-10-CM | POA: Diagnosis not present

## 2017-11-02 DIAGNOSIS — I130 Hypertensive heart and chronic kidney disease with heart failure and stage 1 through stage 4 chronic kidney disease, or unspecified chronic kidney disease: Secondary | ICD-10-CM | POA: Diagnosis not present

## 2017-11-02 DIAGNOSIS — E1121 Type 2 diabetes mellitus with diabetic nephropathy: Secondary | ICD-10-CM | POA: Diagnosis not present

## 2017-11-02 DIAGNOSIS — I5032 Chronic diastolic (congestive) heart failure: Secondary | ICD-10-CM | POA: Diagnosis not present

## 2017-11-02 DIAGNOSIS — J9611 Chronic respiratory failure with hypoxia: Secondary | ICD-10-CM | POA: Diagnosis not present

## 2017-11-02 DIAGNOSIS — E1122 Type 2 diabetes mellitus with diabetic chronic kidney disease: Secondary | ICD-10-CM | POA: Diagnosis not present

## 2017-11-03 DIAGNOSIS — E1121 Type 2 diabetes mellitus with diabetic nephropathy: Secondary | ICD-10-CM | POA: Diagnosis not present

## 2017-11-03 DIAGNOSIS — E1122 Type 2 diabetes mellitus with diabetic chronic kidney disease: Secondary | ICD-10-CM | POA: Diagnosis not present

## 2017-11-03 DIAGNOSIS — I130 Hypertensive heart and chronic kidney disease with heart failure and stage 1 through stage 4 chronic kidney disease, or unspecified chronic kidney disease: Secondary | ICD-10-CM | POA: Diagnosis not present

## 2017-11-03 DIAGNOSIS — N183 Chronic kidney disease, stage 3 (moderate): Secondary | ICD-10-CM | POA: Diagnosis not present

## 2017-11-03 DIAGNOSIS — I5032 Chronic diastolic (congestive) heart failure: Secondary | ICD-10-CM | POA: Diagnosis not present

## 2017-11-03 DIAGNOSIS — J9611 Chronic respiratory failure with hypoxia: Secondary | ICD-10-CM | POA: Diagnosis not present

## 2017-11-04 DIAGNOSIS — I130 Hypertensive heart and chronic kidney disease with heart failure and stage 1 through stage 4 chronic kidney disease, or unspecified chronic kidney disease: Secondary | ICD-10-CM | POA: Diagnosis not present

## 2017-11-04 DIAGNOSIS — E1121 Type 2 diabetes mellitus with diabetic nephropathy: Secondary | ICD-10-CM | POA: Diagnosis not present

## 2017-11-04 DIAGNOSIS — I5032 Chronic diastolic (congestive) heart failure: Secondary | ICD-10-CM | POA: Diagnosis not present

## 2017-11-04 DIAGNOSIS — J9611 Chronic respiratory failure with hypoxia: Secondary | ICD-10-CM | POA: Diagnosis not present

## 2017-11-04 DIAGNOSIS — N183 Chronic kidney disease, stage 3 (moderate): Secondary | ICD-10-CM | POA: Diagnosis not present

## 2017-11-04 DIAGNOSIS — E1122 Type 2 diabetes mellitus with diabetic chronic kidney disease: Secondary | ICD-10-CM | POA: Diagnosis not present

## 2017-11-07 DIAGNOSIS — I130 Hypertensive heart and chronic kidney disease with heart failure and stage 1 through stage 4 chronic kidney disease, or unspecified chronic kidney disease: Secondary | ICD-10-CM | POA: Diagnosis not present

## 2017-11-07 DIAGNOSIS — E1122 Type 2 diabetes mellitus with diabetic chronic kidney disease: Secondary | ICD-10-CM | POA: Diagnosis not present

## 2017-11-07 DIAGNOSIS — N183 Chronic kidney disease, stage 3 (moderate): Secondary | ICD-10-CM | POA: Diagnosis not present

## 2017-11-07 DIAGNOSIS — E1121 Type 2 diabetes mellitus with diabetic nephropathy: Secondary | ICD-10-CM | POA: Diagnosis not present

## 2017-11-07 DIAGNOSIS — J9611 Chronic respiratory failure with hypoxia: Secondary | ICD-10-CM | POA: Diagnosis not present

## 2017-11-07 DIAGNOSIS — I5032 Chronic diastolic (congestive) heart failure: Secondary | ICD-10-CM | POA: Diagnosis not present

## 2017-11-08 ENCOUNTER — Encounter: Payer: Self-pay | Admitting: Cardiovascular Disease

## 2017-11-08 DIAGNOSIS — I5032 Chronic diastolic (congestive) heart failure: Secondary | ICD-10-CM | POA: Diagnosis not present

## 2017-11-08 DIAGNOSIS — N183 Chronic kidney disease, stage 3 (moderate): Secondary | ICD-10-CM | POA: Diagnosis not present

## 2017-11-08 DIAGNOSIS — E1121 Type 2 diabetes mellitus with diabetic nephropathy: Secondary | ICD-10-CM | POA: Diagnosis not present

## 2017-11-08 DIAGNOSIS — E1122 Type 2 diabetes mellitus with diabetic chronic kidney disease: Secondary | ICD-10-CM | POA: Diagnosis not present

## 2017-11-08 DIAGNOSIS — I130 Hypertensive heart and chronic kidney disease with heart failure and stage 1 through stage 4 chronic kidney disease, or unspecified chronic kidney disease: Secondary | ICD-10-CM | POA: Diagnosis not present

## 2017-11-08 DIAGNOSIS — J9611 Chronic respiratory failure with hypoxia: Secondary | ICD-10-CM | POA: Diagnosis not present

## 2017-11-08 NOTE — Telephone Encounter (Signed)
Sent letter

## 2017-11-09 ENCOUNTER — Other Ambulatory Visit: Payer: Self-pay | Admitting: Cardiovascular Disease

## 2017-11-09 DIAGNOSIS — E1121 Type 2 diabetes mellitus with diabetic nephropathy: Secondary | ICD-10-CM | POA: Diagnosis not present

## 2017-11-09 DIAGNOSIS — I130 Hypertensive heart and chronic kidney disease with heart failure and stage 1 through stage 4 chronic kidney disease, or unspecified chronic kidney disease: Secondary | ICD-10-CM | POA: Diagnosis not present

## 2017-11-09 DIAGNOSIS — J9611 Chronic respiratory failure with hypoxia: Secondary | ICD-10-CM | POA: Diagnosis not present

## 2017-11-09 DIAGNOSIS — I5032 Chronic diastolic (congestive) heart failure: Secondary | ICD-10-CM | POA: Diagnosis not present

## 2017-11-09 DIAGNOSIS — N183 Chronic kidney disease, stage 3 (moderate): Secondary | ICD-10-CM | POA: Diagnosis not present

## 2017-11-09 DIAGNOSIS — E1122 Type 2 diabetes mellitus with diabetic chronic kidney disease: Secondary | ICD-10-CM | POA: Diagnosis not present

## 2017-11-09 NOTE — Telephone Encounter (Signed)
Pt needs appointment last seen 05/2016. Pt overdue for 6 month f/u.

## 2017-11-09 NOTE — Telephone Encounter (Signed)
Patient states she is under hospice care and not sure if she can get a ride to office Would like to see if she really needs to be seen, wanted to see if Dr Rockey Situ really needs her to come in or not  Please advise

## 2017-11-10 ENCOUNTER — Other Ambulatory Visit: Payer: Self-pay

## 2017-11-10 DIAGNOSIS — I130 Hypertensive heart and chronic kidney disease with heart failure and stage 1 through stage 4 chronic kidney disease, or unspecified chronic kidney disease: Secondary | ICD-10-CM | POA: Diagnosis not present

## 2017-11-10 DIAGNOSIS — J9611 Chronic respiratory failure with hypoxia: Secondary | ICD-10-CM | POA: Diagnosis not present

## 2017-11-10 DIAGNOSIS — I5032 Chronic diastolic (congestive) heart failure: Secondary | ICD-10-CM | POA: Diagnosis not present

## 2017-11-10 DIAGNOSIS — E1121 Type 2 diabetes mellitus with diabetic nephropathy: Secondary | ICD-10-CM | POA: Diagnosis not present

## 2017-11-10 DIAGNOSIS — E1122 Type 2 diabetes mellitus with diabetic chronic kidney disease: Secondary | ICD-10-CM | POA: Diagnosis not present

## 2017-11-10 DIAGNOSIS — N183 Chronic kidney disease, stage 3 (moderate): Secondary | ICD-10-CM | POA: Diagnosis not present

## 2017-11-10 NOTE — Telephone Encounter (Signed)
Name of Medication: Tramadol Name of Pharmacy: [facility] or [facility] Date and Quantity: 05/19/17, #60 Last Office Visit and Type: 10/28/17, acute Next Office Visit and Type: 12/15/17, f/u Last Controlled Substance Agreement Date: none Last UDS: none

## 2017-11-11 DIAGNOSIS — E1121 Type 2 diabetes mellitus with diabetic nephropathy: Secondary | ICD-10-CM | POA: Diagnosis not present

## 2017-11-11 DIAGNOSIS — I5032 Chronic diastolic (congestive) heart failure: Secondary | ICD-10-CM | POA: Diagnosis not present

## 2017-11-11 DIAGNOSIS — E1122 Type 2 diabetes mellitus with diabetic chronic kidney disease: Secondary | ICD-10-CM | POA: Diagnosis not present

## 2017-11-11 DIAGNOSIS — J9611 Chronic respiratory failure with hypoxia: Secondary | ICD-10-CM | POA: Diagnosis not present

## 2017-11-11 DIAGNOSIS — I130 Hypertensive heart and chronic kidney disease with heart failure and stage 1 through stage 4 chronic kidney disease, or unspecified chronic kidney disease: Secondary | ICD-10-CM | POA: Diagnosis not present

## 2017-11-11 DIAGNOSIS — N183 Chronic kidney disease, stage 3 (moderate): Secondary | ICD-10-CM | POA: Diagnosis not present

## 2017-11-13 MED ORDER — TRAMADOL HCL 50 MG PO TABS: ORAL_TABLET | ORAL | 3 refills | Status: DC

## 2017-11-13 NOTE — Telephone Encounter (Signed)
Eprescribed.

## 2017-11-14 DIAGNOSIS — I130 Hypertensive heart and chronic kidney disease with heart failure and stage 1 through stage 4 chronic kidney disease, or unspecified chronic kidney disease: Secondary | ICD-10-CM | POA: Diagnosis not present

## 2017-11-14 DIAGNOSIS — N183 Chronic kidney disease, stage 3 (moderate): Secondary | ICD-10-CM | POA: Diagnosis not present

## 2017-11-14 DIAGNOSIS — E1121 Type 2 diabetes mellitus with diabetic nephropathy: Secondary | ICD-10-CM | POA: Diagnosis not present

## 2017-11-14 DIAGNOSIS — E1122 Type 2 diabetes mellitus with diabetic chronic kidney disease: Secondary | ICD-10-CM | POA: Diagnosis not present

## 2017-11-14 DIAGNOSIS — I5032 Chronic diastolic (congestive) heart failure: Secondary | ICD-10-CM | POA: Diagnosis not present

## 2017-11-14 DIAGNOSIS — J9611 Chronic respiratory failure with hypoxia: Secondary | ICD-10-CM | POA: Diagnosis not present

## 2017-11-15 DIAGNOSIS — E1121 Type 2 diabetes mellitus with diabetic nephropathy: Secondary | ICD-10-CM | POA: Diagnosis not present

## 2017-11-15 DIAGNOSIS — I5032 Chronic diastolic (congestive) heart failure: Secondary | ICD-10-CM | POA: Diagnosis not present

## 2017-11-15 DIAGNOSIS — I130 Hypertensive heart and chronic kidney disease with heart failure and stage 1 through stage 4 chronic kidney disease, or unspecified chronic kidney disease: Secondary | ICD-10-CM | POA: Diagnosis not present

## 2017-11-15 DIAGNOSIS — J9611 Chronic respiratory failure with hypoxia: Secondary | ICD-10-CM | POA: Diagnosis not present

## 2017-11-15 DIAGNOSIS — N183 Chronic kidney disease, stage 3 (moderate): Secondary | ICD-10-CM | POA: Diagnosis not present

## 2017-11-15 DIAGNOSIS — E1122 Type 2 diabetes mellitus with diabetic chronic kidney disease: Secondary | ICD-10-CM | POA: Diagnosis not present

## 2017-11-16 DIAGNOSIS — E1122 Type 2 diabetes mellitus with diabetic chronic kidney disease: Secondary | ICD-10-CM | POA: Diagnosis not present

## 2017-11-16 DIAGNOSIS — I5032 Chronic diastolic (congestive) heart failure: Secondary | ICD-10-CM | POA: Diagnosis not present

## 2017-11-16 DIAGNOSIS — I130 Hypertensive heart and chronic kidney disease with heart failure and stage 1 through stage 4 chronic kidney disease, or unspecified chronic kidney disease: Secondary | ICD-10-CM | POA: Diagnosis not present

## 2017-11-16 DIAGNOSIS — J9611 Chronic respiratory failure with hypoxia: Secondary | ICD-10-CM | POA: Diagnosis not present

## 2017-11-16 DIAGNOSIS — E1121 Type 2 diabetes mellitus with diabetic nephropathy: Secondary | ICD-10-CM | POA: Diagnosis not present

## 2017-11-16 DIAGNOSIS — N183 Chronic kidney disease, stage 3 (moderate): Secondary | ICD-10-CM | POA: Diagnosis not present

## 2017-11-17 ENCOUNTER — Other Ambulatory Visit: Payer: Self-pay | Admitting: Family Medicine

## 2017-11-17 DIAGNOSIS — I5032 Chronic diastolic (congestive) heart failure: Secondary | ICD-10-CM | POA: Diagnosis not present

## 2017-11-17 DIAGNOSIS — N183 Chronic kidney disease, stage 3 (moderate): Secondary | ICD-10-CM | POA: Diagnosis not present

## 2017-11-17 DIAGNOSIS — E1121 Type 2 diabetes mellitus with diabetic nephropathy: Secondary | ICD-10-CM | POA: Diagnosis not present

## 2017-11-17 DIAGNOSIS — I130 Hypertensive heart and chronic kidney disease with heart failure and stage 1 through stage 4 chronic kidney disease, or unspecified chronic kidney disease: Secondary | ICD-10-CM | POA: Diagnosis not present

## 2017-11-17 DIAGNOSIS — E1122 Type 2 diabetes mellitus with diabetic chronic kidney disease: Secondary | ICD-10-CM | POA: Diagnosis not present

## 2017-11-17 DIAGNOSIS — J9611 Chronic respiratory failure with hypoxia: Secondary | ICD-10-CM | POA: Diagnosis not present

## 2017-11-18 DIAGNOSIS — I5032 Chronic diastolic (congestive) heart failure: Secondary | ICD-10-CM | POA: Diagnosis not present

## 2017-11-18 DIAGNOSIS — I130 Hypertensive heart and chronic kidney disease with heart failure and stage 1 through stage 4 chronic kidney disease, or unspecified chronic kidney disease: Secondary | ICD-10-CM | POA: Diagnosis not present

## 2017-11-18 DIAGNOSIS — J9611 Chronic respiratory failure with hypoxia: Secondary | ICD-10-CM | POA: Diagnosis not present

## 2017-11-18 DIAGNOSIS — E1122 Type 2 diabetes mellitus with diabetic chronic kidney disease: Secondary | ICD-10-CM | POA: Diagnosis not present

## 2017-11-18 DIAGNOSIS — N183 Chronic kidney disease, stage 3 (moderate): Secondary | ICD-10-CM | POA: Diagnosis not present

## 2017-11-18 DIAGNOSIS — E1121 Type 2 diabetes mellitus with diabetic nephropathy: Secondary | ICD-10-CM | POA: Diagnosis not present

## 2017-11-21 ENCOUNTER — Telehealth: Payer: Self-pay | Admitting: Family Medicine

## 2017-11-21 DIAGNOSIS — E1121 Type 2 diabetes mellitus with diabetic nephropathy: Secondary | ICD-10-CM | POA: Diagnosis not present

## 2017-11-21 DIAGNOSIS — E1122 Type 2 diabetes mellitus with diabetic chronic kidney disease: Secondary | ICD-10-CM | POA: Diagnosis not present

## 2017-11-21 DIAGNOSIS — J9611 Chronic respiratory failure with hypoxia: Secondary | ICD-10-CM | POA: Diagnosis not present

## 2017-11-21 DIAGNOSIS — I130 Hypertensive heart and chronic kidney disease with heart failure and stage 1 through stage 4 chronic kidney disease, or unspecified chronic kidney disease: Secondary | ICD-10-CM | POA: Diagnosis not present

## 2017-11-21 DIAGNOSIS — N183 Chronic kidney disease, stage 3 (moderate): Secondary | ICD-10-CM | POA: Diagnosis not present

## 2017-11-21 DIAGNOSIS — I5032 Chronic diastolic (congestive) heart failure: Secondary | ICD-10-CM | POA: Diagnosis not present

## 2017-11-21 NOTE — Telephone Encounter (Signed)
plz touch base with hospice. I'm not sure I understand request

## 2017-11-21 NOTE — Telephone Encounter (Signed)
Ok to do - but this isn't a solution.  Need more information - is pain increasing, do we need to increase tramadol dosing? Or is this just acute increase and normally pain is well managed?

## 2017-11-21 NOTE — Telephone Encounter (Signed)
Spoke with [name], relaying Dr. Synthia Innocent message.  Per [name], this is just a one time thing. They had not received pt's refills from [facility] so pt went all day yesterday without pain meds.  Also, [name] is asking that pt's rxs be sent to [facility] in the future.

## 2017-11-21 NOTE — Telephone Encounter (Signed)
Spoke with [name] of Hospice to get clarification of request.  Says pt is hurting so bad and [name] is asking for verbal order to administer 1 time dose of tramadol.

## 2017-11-21 NOTE — Telephone Encounter (Signed)
Copied from [place] [phone]. Topic: Quick Communication - Home Health Verbal Orders >> Nov 21, 2017 10:01 AM Alfredia Ferguson R wrote: Caller/Agency: [facility] Number: [phone] Requesting OT/PT/Skilled Nursing/Social Work:t o administer tramadol Frequency:per order given

## 2017-11-22 ENCOUNTER — Other Ambulatory Visit: Payer: Self-pay | Admitting: Family Medicine

## 2017-11-22 ENCOUNTER — Telehealth: Payer: Self-pay | Admitting: Family Medicine

## 2017-11-22 DIAGNOSIS — J9611 Chronic respiratory failure with hypoxia: Secondary | ICD-10-CM | POA: Diagnosis not present

## 2017-11-22 DIAGNOSIS — N183 Chronic kidney disease, stage 3 (moderate): Secondary | ICD-10-CM | POA: Diagnosis not present

## 2017-11-22 DIAGNOSIS — I5032 Chronic diastolic (congestive) heart failure: Secondary | ICD-10-CM | POA: Diagnosis not present

## 2017-11-22 DIAGNOSIS — E1122 Type 2 diabetes mellitus with diabetic chronic kidney disease: Secondary | ICD-10-CM | POA: Diagnosis not present

## 2017-11-22 DIAGNOSIS — I130 Hypertensive heart and chronic kidney disease with heart failure and stage 1 through stage 4 chronic kidney disease, or unspecified chronic kidney disease: Secondary | ICD-10-CM | POA: Diagnosis not present

## 2017-11-22 DIAGNOSIS — E1121 Type 2 diabetes mellitus with diabetic nephropathy: Secondary | ICD-10-CM | POA: Diagnosis not present

## 2017-11-22 MED ORDER — LORAZEPAM 0.5 MG PO TABS: 0.5000 mg | ORAL_TABLET | Freq: Every day | ORAL | 3 refills | Status: DC

## 2017-11-22 MED ORDER — LORAZEPAM 1 MG PO TABS: 1.0000 mg | ORAL_TABLET | Freq: Every day | ORAL | 3 refills | Status: DC

## 2017-11-22 NOTE — Addendum Note (Signed)
Addended by: Ria Bush on: 11/22/2017 06:23 PM   Modules accepted: Orders

## 2017-11-22 NOTE — Telephone Encounter (Signed)
Spoke with Christine Blanchard of hospice informing her Dr. Darnell Level agrees with restarting nystatin powder.  Verbalizes understanding. Also, Christine Blanchard mentioned pt needs refill for Ativan 0.5 mg and 1 mg tabs sent to [facility].  Name of Medication: Ativan  Name of Pharmacy: [facility] or Written Date and Quantity:      Ativan 0.5 mg:  08/19/17, #30     Ativan 1 mg:  08/03/17, #30 Last Office Visit and Type: 10/28/17, acute Next Office Visit and Type: 12/15/17, f/u Last Controlled Substance Agreement Date: none Last UDS: none

## 2017-11-22 NOTE — Addendum Note (Signed)
Addended by: Brenton Grills on: 46/80/3212 03:43 PM   Modules accepted: Orders

## 2017-11-22 NOTE — Telephone Encounter (Signed)
Noted. Agree with restarting nystatin powder.

## 2017-11-22 NOTE — Telephone Encounter (Signed)
Spoke to pt and advised her refill request is too early. Last Rx 7/19 #30 3R. Pt is not due until 11/19. Advised pt to contact pharmacy directly

## 2017-11-22 NOTE — Telephone Encounter (Signed)
Copied from [place] [phone]. Topic: Quick Communication - Rx Refill/Question >> Nov 22, 2017  3:20 PM Wynetta Emery, [place] C wrote: Medication: LORazepam (ATIVAN) 0.5 MG tablet and also LORazepam (ATIVAN) 1 MG tablet  Has the patient contacted their pharmacy? No   (Agent: If no, request that the patient contact the pharmacy for the refill.) (Agent: If yes, when and what did the pharmacy advise?)  Preferred Pharmacy (with phone number or street name): [facility], [place] [address] [phone] (Phone) [phone] (Fax)    Agent: Please be advised that RX refills may take up to 3 business days. We ask that you follow-up with your pharmacy.

## 2017-11-22 NOTE — Telephone Encounter (Signed)
Copied from [place] [phone]. Topic: Quick Communication - See Telephone Encounter >> Nov 22, 2017  1:09 PM Conception Chancy, NT wrote: CRM for notification. See Telephone encounter for: 11/22/17.  Marzetta Board is calling from Hospice and states the patient had a fall about 12pm today 11/22/17 when the med tech when in the patient room to make sure she was coming for lunch. Patient states she did not fall but she was sitting on her bottom when she walked into the room. Patient is okay and not complaining of anything hurting. Patient still has some yeast under breast, medication has been stopped since it was only for 10 days but she is going to start the Nystatin Powder.   [phone]

## 2017-11-22 NOTE — Telephone Encounter (Signed)
Sent electronically 

## 2017-11-23 DIAGNOSIS — N183 Chronic kidney disease, stage 3 (moderate): Secondary | ICD-10-CM | POA: Diagnosis not present

## 2017-11-23 DIAGNOSIS — E1121 Type 2 diabetes mellitus with diabetic nephropathy: Secondary | ICD-10-CM | POA: Diagnosis not present

## 2017-11-23 DIAGNOSIS — E1122 Type 2 diabetes mellitus with diabetic chronic kidney disease: Secondary | ICD-10-CM | POA: Diagnosis not present

## 2017-11-23 DIAGNOSIS — I130 Hypertensive heart and chronic kidney disease with heart failure and stage 1 through stage 4 chronic kidney disease, or unspecified chronic kidney disease: Secondary | ICD-10-CM | POA: Diagnosis not present

## 2017-11-23 DIAGNOSIS — I5032 Chronic diastolic (congestive) heart failure: Secondary | ICD-10-CM | POA: Diagnosis not present

## 2017-11-23 DIAGNOSIS — J9611 Chronic respiratory failure with hypoxia: Secondary | ICD-10-CM | POA: Diagnosis not present

## 2017-11-24 DIAGNOSIS — J9611 Chronic respiratory failure with hypoxia: Secondary | ICD-10-CM | POA: Diagnosis not present

## 2017-11-24 DIAGNOSIS — E1122 Type 2 diabetes mellitus with diabetic chronic kidney disease: Secondary | ICD-10-CM | POA: Diagnosis not present

## 2017-11-24 DIAGNOSIS — N183 Chronic kidney disease, stage 3 (moderate): Secondary | ICD-10-CM | POA: Diagnosis not present

## 2017-11-24 DIAGNOSIS — I130 Hypertensive heart and chronic kidney disease with heart failure and stage 1 through stage 4 chronic kidney disease, or unspecified chronic kidney disease: Secondary | ICD-10-CM | POA: Diagnosis not present

## 2017-11-24 DIAGNOSIS — E1121 Type 2 diabetes mellitus with diabetic nephropathy: Secondary | ICD-10-CM | POA: Diagnosis not present

## 2017-11-24 DIAGNOSIS — I5032 Chronic diastolic (congestive) heart failure: Secondary | ICD-10-CM | POA: Diagnosis not present

## 2017-11-25 DIAGNOSIS — E1122 Type 2 diabetes mellitus with diabetic chronic kidney disease: Secondary | ICD-10-CM | POA: Diagnosis not present

## 2017-11-25 DIAGNOSIS — I5032 Chronic diastolic (congestive) heart failure: Secondary | ICD-10-CM | POA: Diagnosis not present

## 2017-11-25 DIAGNOSIS — N183 Chronic kidney disease, stage 3 (moderate): Secondary | ICD-10-CM | POA: Diagnosis not present

## 2017-11-25 DIAGNOSIS — E1121 Type 2 diabetes mellitus with diabetic nephropathy: Secondary | ICD-10-CM | POA: Diagnosis not present

## 2017-11-25 DIAGNOSIS — I130 Hypertensive heart and chronic kidney disease with heart failure and stage 1 through stage 4 chronic kidney disease, or unspecified chronic kidney disease: Secondary | ICD-10-CM | POA: Diagnosis not present

## 2017-11-25 DIAGNOSIS — J9611 Chronic respiratory failure with hypoxia: Secondary | ICD-10-CM | POA: Diagnosis not present

## 2017-11-28 DIAGNOSIS — J9611 Chronic respiratory failure with hypoxia: Secondary | ICD-10-CM | POA: Diagnosis not present

## 2017-11-28 DIAGNOSIS — E1121 Type 2 diabetes mellitus with diabetic nephropathy: Secondary | ICD-10-CM | POA: Diagnosis not present

## 2017-11-28 DIAGNOSIS — E1122 Type 2 diabetes mellitus with diabetic chronic kidney disease: Secondary | ICD-10-CM | POA: Diagnosis not present

## 2017-11-28 DIAGNOSIS — I130 Hypertensive heart and chronic kidney disease with heart failure and stage 1 through stage 4 chronic kidney disease, or unspecified chronic kidney disease: Secondary | ICD-10-CM | POA: Diagnosis not present

## 2017-11-28 DIAGNOSIS — I5032 Chronic diastolic (congestive) heart failure: Secondary | ICD-10-CM | POA: Diagnosis not present

## 2017-11-28 DIAGNOSIS — N183 Chronic kidney disease, stage 3 (moderate): Secondary | ICD-10-CM | POA: Diagnosis not present

## 2017-11-29 DIAGNOSIS — E1121 Type 2 diabetes mellitus with diabetic nephropathy: Secondary | ICD-10-CM | POA: Diagnosis not present

## 2017-11-29 DIAGNOSIS — J9611 Chronic respiratory failure with hypoxia: Secondary | ICD-10-CM | POA: Diagnosis not present

## 2017-11-29 DIAGNOSIS — E1122 Type 2 diabetes mellitus with diabetic chronic kidney disease: Secondary | ICD-10-CM | POA: Diagnosis not present

## 2017-11-29 DIAGNOSIS — N183 Chronic kidney disease, stage 3 (moderate): Secondary | ICD-10-CM | POA: Diagnosis not present

## 2017-11-29 DIAGNOSIS — I5032 Chronic diastolic (congestive) heart failure: Secondary | ICD-10-CM | POA: Diagnosis not present

## 2017-11-29 DIAGNOSIS — I130 Hypertensive heart and chronic kidney disease with heart failure and stage 1 through stage 4 chronic kidney disease, or unspecified chronic kidney disease: Secondary | ICD-10-CM | POA: Diagnosis not present

## 2017-11-30 DIAGNOSIS — J9611 Chronic respiratory failure with hypoxia: Secondary | ICD-10-CM | POA: Diagnosis not present

## 2017-11-30 DIAGNOSIS — I5032 Chronic diastolic (congestive) heart failure: Secondary | ICD-10-CM | POA: Diagnosis not present

## 2017-11-30 DIAGNOSIS — I130 Hypertensive heart and chronic kidney disease with heart failure and stage 1 through stage 4 chronic kidney disease, or unspecified chronic kidney disease: Secondary | ICD-10-CM | POA: Diagnosis not present

## 2017-11-30 DIAGNOSIS — N183 Chronic kidney disease, stage 3 (moderate): Secondary | ICD-10-CM | POA: Diagnosis not present

## 2017-11-30 DIAGNOSIS — E1121 Type 2 diabetes mellitus with diabetic nephropathy: Secondary | ICD-10-CM | POA: Diagnosis not present

## 2017-11-30 DIAGNOSIS — E1122 Type 2 diabetes mellitus with diabetic chronic kidney disease: Secondary | ICD-10-CM | POA: Diagnosis not present

## 2017-12-01 DIAGNOSIS — J9611 Chronic respiratory failure with hypoxia: Secondary | ICD-10-CM | POA: Diagnosis not present

## 2017-12-01 DIAGNOSIS — N183 Chronic kidney disease, stage 3 (moderate): Secondary | ICD-10-CM | POA: Diagnosis not present

## 2017-12-01 DIAGNOSIS — E1122 Type 2 diabetes mellitus with diabetic chronic kidney disease: Secondary | ICD-10-CM | POA: Diagnosis not present

## 2017-12-01 DIAGNOSIS — I5032 Chronic diastolic (congestive) heart failure: Secondary | ICD-10-CM | POA: Diagnosis not present

## 2017-12-01 DIAGNOSIS — I130 Hypertensive heart and chronic kidney disease with heart failure and stage 1 through stage 4 chronic kidney disease, or unspecified chronic kidney disease: Secondary | ICD-10-CM | POA: Diagnosis not present

## 2017-12-01 DIAGNOSIS — E1121 Type 2 diabetes mellitus with diabetic nephropathy: Secondary | ICD-10-CM | POA: Diagnosis not present

## 2017-12-02 ENCOUNTER — Telehealth: Payer: Self-pay | Admitting: *Deleted

## 2017-12-02 DIAGNOSIS — J9611 Chronic respiratory failure with hypoxia: Secondary | ICD-10-CM | POA: Diagnosis not present

## 2017-12-02 DIAGNOSIS — I130 Hypertensive heart and chronic kidney disease with heart failure and stage 1 through stage 4 chronic kidney disease, or unspecified chronic kidney disease: Secondary | ICD-10-CM | POA: Diagnosis not present

## 2017-12-02 DIAGNOSIS — E1122 Type 2 diabetes mellitus with diabetic chronic kidney disease: Secondary | ICD-10-CM | POA: Diagnosis not present

## 2017-12-02 DIAGNOSIS — I5032 Chronic diastolic (congestive) heart failure: Secondary | ICD-10-CM | POA: Diagnosis not present

## 2017-12-02 DIAGNOSIS — Z9981 Dependence on supplemental oxygen: Secondary | ICD-10-CM | POA: Diagnosis not present

## 2017-12-02 DIAGNOSIS — R32 Unspecified urinary incontinence: Secondary | ICD-10-CM | POA: Diagnosis not present

## 2017-12-02 DIAGNOSIS — N183 Chronic kidney disease, stage 3 (moderate): Secondary | ICD-10-CM | POA: Diagnosis not present

## 2017-12-02 DIAGNOSIS — Z7901 Long term (current) use of anticoagulants: Secondary | ICD-10-CM | POA: Diagnosis not present

## 2017-12-02 DIAGNOSIS — Z7984 Long term (current) use of oral hypoglycemic drugs: Secondary | ICD-10-CM | POA: Diagnosis not present

## 2017-12-02 DIAGNOSIS — Z741 Need for assistance with personal care: Secondary | ICD-10-CM | POA: Diagnosis not present

## 2017-12-02 DIAGNOSIS — Z7902 Long term (current) use of antithrombotics/antiplatelets: Secondary | ICD-10-CM | POA: Diagnosis not present

## 2017-12-02 DIAGNOSIS — E785 Hyperlipidemia, unspecified: Secondary | ICD-10-CM | POA: Diagnosis not present

## 2017-12-02 DIAGNOSIS — I25700 Atherosclerosis of coronary artery bypass graft(s), unspecified, with unstable angina pectoris: Secondary | ICD-10-CM | POA: Diagnosis not present

## 2017-12-02 DIAGNOSIS — Z9181 History of falling: Secondary | ICD-10-CM | POA: Diagnosis not present

## 2017-12-02 DIAGNOSIS — Z947 Corneal transplant status: Secondary | ICD-10-CM | POA: Diagnosis not present

## 2017-12-02 DIAGNOSIS — E1121 Type 2 diabetes mellitus with diabetic nephropathy: Secondary | ICD-10-CM | POA: Diagnosis not present

## 2017-12-02 MED ORDER — NYSTATIN 100000 UNIT/GM EX CREA: 1.0000 "application " | TOPICAL_CREAM | Freq: Two times a day (BID) | CUTANEOUS | 1 refills | Status: AC

## 2017-12-02 NOTE — Telephone Encounter (Signed)
Sherry with Hospice advised.

## 2017-12-02 NOTE — Telephone Encounter (Signed)
Nystatin cream sent to pharmacy.

## 2017-12-02 NOTE — Telephone Encounter (Signed)
Sherry with hospice lm on vm on triage stating pt has rash under her breast and is requesting nystatin cream be sent to her pharmacy. Rx can be faxed to [facility] or sent to local pharmacy

## 2017-12-05 ENCOUNTER — Telehealth: Payer: Self-pay | Admitting: *Deleted

## 2017-12-05 DIAGNOSIS — I5032 Chronic diastolic (congestive) heart failure: Secondary | ICD-10-CM | POA: Diagnosis not present

## 2017-12-05 DIAGNOSIS — E1122 Type 2 diabetes mellitus with diabetic chronic kidney disease: Secondary | ICD-10-CM | POA: Diagnosis not present

## 2017-12-05 DIAGNOSIS — N183 Chronic kidney disease, stage 3 (moderate): Secondary | ICD-10-CM | POA: Diagnosis not present

## 2017-12-05 DIAGNOSIS — I130 Hypertensive heart and chronic kidney disease with heart failure and stage 1 through stage 4 chronic kidney disease, or unspecified chronic kidney disease: Secondary | ICD-10-CM | POA: Diagnosis not present

## 2017-12-05 DIAGNOSIS — J9611 Chronic respiratory failure with hypoxia: Secondary | ICD-10-CM | POA: Diagnosis not present

## 2017-12-05 DIAGNOSIS — E1121 Type 2 diabetes mellitus with diabetic nephropathy: Secondary | ICD-10-CM | POA: Diagnosis not present

## 2017-12-05 NOTE — Telephone Encounter (Signed)
[name] called requesting a order because patient has a cough. [name] would like a standing order for Mucinex 600 mg ER, take by mouth two times a day.

## 2017-12-05 NOTE — Telephone Encounter (Signed)
Ok to do this order. Thank you.

## 2017-12-06 DIAGNOSIS — I5032 Chronic diastolic (congestive) heart failure: Secondary | ICD-10-CM | POA: Diagnosis not present

## 2017-12-06 DIAGNOSIS — E1121 Type 2 diabetes mellitus with diabetic nephropathy: Secondary | ICD-10-CM | POA: Diagnosis not present

## 2017-12-06 DIAGNOSIS — I130 Hypertensive heart and chronic kidney disease with heart failure and stage 1 through stage 4 chronic kidney disease, or unspecified chronic kidney disease: Secondary | ICD-10-CM | POA: Diagnosis not present

## 2017-12-06 DIAGNOSIS — N183 Chronic kidney disease, stage 3 (moderate): Secondary | ICD-10-CM | POA: Diagnosis not present

## 2017-12-06 DIAGNOSIS — J9611 Chronic respiratory failure with hypoxia: Secondary | ICD-10-CM | POA: Diagnosis not present

## 2017-12-06 DIAGNOSIS — E1122 Type 2 diabetes mellitus with diabetic chronic kidney disease: Secondary | ICD-10-CM | POA: Diagnosis not present

## 2017-12-06 NOTE — Telephone Encounter (Signed)
Spoke to [employer] and provided verbal orders

## 2017-12-06 NOTE — Telephone Encounter (Signed)
Noted  

## 2017-12-07 DIAGNOSIS — E1122 Type 2 diabetes mellitus with diabetic chronic kidney disease: Secondary | ICD-10-CM | POA: Diagnosis not present

## 2017-12-07 DIAGNOSIS — N183 Chronic kidney disease, stage 3 (moderate): Secondary | ICD-10-CM | POA: Diagnosis not present

## 2017-12-07 DIAGNOSIS — E1121 Type 2 diabetes mellitus with diabetic nephropathy: Secondary | ICD-10-CM | POA: Diagnosis not present

## 2017-12-07 DIAGNOSIS — I5032 Chronic diastolic (congestive) heart failure: Secondary | ICD-10-CM | POA: Diagnosis not present

## 2017-12-07 DIAGNOSIS — I130 Hypertensive heart and chronic kidney disease with heart failure and stage 1 through stage 4 chronic kidney disease, or unspecified chronic kidney disease: Secondary | ICD-10-CM | POA: Diagnosis not present

## 2017-12-07 DIAGNOSIS — J9611 Chronic respiratory failure with hypoxia: Secondary | ICD-10-CM | POA: Diagnosis not present

## 2017-12-08 DIAGNOSIS — J9611 Chronic respiratory failure with hypoxia: Secondary | ICD-10-CM | POA: Diagnosis not present

## 2017-12-08 DIAGNOSIS — N183 Chronic kidney disease, stage 3 (moderate): Secondary | ICD-10-CM | POA: Diagnosis not present

## 2017-12-08 DIAGNOSIS — I5032 Chronic diastolic (congestive) heart failure: Secondary | ICD-10-CM | POA: Diagnosis not present

## 2017-12-08 DIAGNOSIS — I130 Hypertensive heart and chronic kidney disease with heart failure and stage 1 through stage 4 chronic kidney disease, or unspecified chronic kidney disease: Secondary | ICD-10-CM | POA: Diagnosis not present

## 2017-12-08 DIAGNOSIS — E1121 Type 2 diabetes mellitus with diabetic nephropathy: Secondary | ICD-10-CM | POA: Diagnosis not present

## 2017-12-08 DIAGNOSIS — E1122 Type 2 diabetes mellitus with diabetic chronic kidney disease: Secondary | ICD-10-CM | POA: Diagnosis not present

## 2017-12-09 DIAGNOSIS — N183 Chronic kidney disease, stage 3 (moderate): Secondary | ICD-10-CM | POA: Diagnosis not present

## 2017-12-09 DIAGNOSIS — E1122 Type 2 diabetes mellitus with diabetic chronic kidney disease: Secondary | ICD-10-CM | POA: Diagnosis not present

## 2017-12-09 DIAGNOSIS — I5032 Chronic diastolic (congestive) heart failure: Secondary | ICD-10-CM | POA: Diagnosis not present

## 2017-12-09 DIAGNOSIS — I130 Hypertensive heart and chronic kidney disease with heart failure and stage 1 through stage 4 chronic kidney disease, or unspecified chronic kidney disease: Secondary | ICD-10-CM | POA: Diagnosis not present

## 2017-12-09 DIAGNOSIS — J9611 Chronic respiratory failure with hypoxia: Secondary | ICD-10-CM | POA: Diagnosis not present

## 2017-12-09 DIAGNOSIS — E1121 Type 2 diabetes mellitus with diabetic nephropathy: Secondary | ICD-10-CM | POA: Diagnosis not present

## 2017-12-10 DIAGNOSIS — E1122 Type 2 diabetes mellitus with diabetic chronic kidney disease: Secondary | ICD-10-CM | POA: Diagnosis not present

## 2017-12-10 DIAGNOSIS — I5032 Chronic diastolic (congestive) heart failure: Secondary | ICD-10-CM | POA: Diagnosis not present

## 2017-12-10 DIAGNOSIS — N183 Chronic kidney disease, stage 3 (moderate): Secondary | ICD-10-CM | POA: Diagnosis not present

## 2017-12-10 DIAGNOSIS — J9611 Chronic respiratory failure with hypoxia: Secondary | ICD-10-CM | POA: Diagnosis not present

## 2017-12-10 DIAGNOSIS — I130 Hypertensive heart and chronic kidney disease with heart failure and stage 1 through stage 4 chronic kidney disease, or unspecified chronic kidney disease: Secondary | ICD-10-CM | POA: Diagnosis not present

## 2017-12-10 DIAGNOSIS — E1121 Type 2 diabetes mellitus with diabetic nephropathy: Secondary | ICD-10-CM | POA: Diagnosis not present

## 2017-12-12 DIAGNOSIS — I130 Hypertensive heart and chronic kidney disease with heart failure and stage 1 through stage 4 chronic kidney disease, or unspecified chronic kidney disease: Secondary | ICD-10-CM | POA: Diagnosis not present

## 2017-12-12 DIAGNOSIS — N183 Chronic kidney disease, stage 3 (moderate): Secondary | ICD-10-CM | POA: Diagnosis not present

## 2017-12-12 DIAGNOSIS — J9611 Chronic respiratory failure with hypoxia: Secondary | ICD-10-CM | POA: Diagnosis not present

## 2017-12-12 DIAGNOSIS — E1122 Type 2 diabetes mellitus with diabetic chronic kidney disease: Secondary | ICD-10-CM | POA: Diagnosis not present

## 2017-12-12 DIAGNOSIS — E1121 Type 2 diabetes mellitus with diabetic nephropathy: Secondary | ICD-10-CM | POA: Diagnosis not present

## 2017-12-12 DIAGNOSIS — I5032 Chronic diastolic (congestive) heart failure: Secondary | ICD-10-CM | POA: Diagnosis not present

## 2017-12-13 DIAGNOSIS — E1121 Type 2 diabetes mellitus with diabetic nephropathy: Secondary | ICD-10-CM | POA: Diagnosis not present

## 2017-12-13 DIAGNOSIS — I5032 Chronic diastolic (congestive) heart failure: Secondary | ICD-10-CM | POA: Diagnosis not present

## 2017-12-13 DIAGNOSIS — E1122 Type 2 diabetes mellitus with diabetic chronic kidney disease: Secondary | ICD-10-CM | POA: Diagnosis not present

## 2017-12-13 DIAGNOSIS — N183 Chronic kidney disease, stage 3 (moderate): Secondary | ICD-10-CM | POA: Diagnosis not present

## 2017-12-13 DIAGNOSIS — I130 Hypertensive heart and chronic kidney disease with heart failure and stage 1 through stage 4 chronic kidney disease, or unspecified chronic kidney disease: Secondary | ICD-10-CM | POA: Diagnosis not present

## 2017-12-13 DIAGNOSIS — J9611 Chronic respiratory failure with hypoxia: Secondary | ICD-10-CM | POA: Diagnosis not present

## 2017-12-14 DIAGNOSIS — J9611 Chronic respiratory failure with hypoxia: Secondary | ICD-10-CM | POA: Diagnosis not present

## 2017-12-14 DIAGNOSIS — N183 Chronic kidney disease, stage 3 (moderate): Secondary | ICD-10-CM | POA: Diagnosis not present

## 2017-12-14 DIAGNOSIS — I5032 Chronic diastolic (congestive) heart failure: Secondary | ICD-10-CM | POA: Diagnosis not present

## 2017-12-14 DIAGNOSIS — I130 Hypertensive heart and chronic kidney disease with heart failure and stage 1 through stage 4 chronic kidney disease, or unspecified chronic kidney disease: Secondary | ICD-10-CM | POA: Diagnosis not present

## 2017-12-14 DIAGNOSIS — E1121 Type 2 diabetes mellitus with diabetic nephropathy: Secondary | ICD-10-CM | POA: Diagnosis not present

## 2017-12-14 DIAGNOSIS — E1122 Type 2 diabetes mellitus with diabetic chronic kidney disease: Secondary | ICD-10-CM | POA: Diagnosis not present

## 2017-12-15 ENCOUNTER — Ambulatory Visit (INDEPENDENT_AMBULATORY_CARE_PROVIDER_SITE_OTHER): Admitting: Family Medicine

## 2017-12-15 ENCOUNTER — Encounter: Payer: Self-pay | Admitting: Family Medicine

## 2017-12-15 VITALS — BP 114/60 | HR 61 | Temp 97.8°F | Ht 65.0 in | Wt 127.0 lb

## 2017-12-15 DIAGNOSIS — I5032 Chronic diastolic (congestive) heart failure: Secondary | ICD-10-CM | POA: Diagnosis not present

## 2017-12-15 DIAGNOSIS — I130 Hypertensive heart and chronic kidney disease with heart failure and stage 1 through stage 4 chronic kidney disease, or unspecified chronic kidney disease: Secondary | ICD-10-CM | POA: Diagnosis not present

## 2017-12-15 DIAGNOSIS — Z66 Do not resuscitate: Secondary | ICD-10-CM

## 2017-12-15 DIAGNOSIS — L989 Disorder of the skin and subcutaneous tissue, unspecified: Secondary | ICD-10-CM

## 2017-12-15 DIAGNOSIS — J9611 Chronic respiratory failure with hypoxia: Secondary | ICD-10-CM | POA: Diagnosis not present

## 2017-12-15 DIAGNOSIS — E1122 Type 2 diabetes mellitus with diabetic chronic kidney disease: Secondary | ICD-10-CM | POA: Diagnosis not present

## 2017-12-15 DIAGNOSIS — N183 Chronic kidney disease, stage 3 (moderate): Secondary | ICD-10-CM | POA: Diagnosis not present

## 2017-12-15 DIAGNOSIS — F331 Major depressive disorder, recurrent, moderate: Secondary | ICD-10-CM | POA: Diagnosis not present

## 2017-12-15 DIAGNOSIS — E1121 Type 2 diabetes mellitus with diabetic nephropathy: Secondary | ICD-10-CM

## 2017-12-15 DIAGNOSIS — Z515 Encounter for palliative care: Secondary | ICD-10-CM

## 2017-12-15 LAB — POCT GLYCOSYLATED HEMOGLOBIN (HGB A1C): HEMOGLOBIN A1C: 6.4 % — AB (ref 4.0–5.6)

## 2017-12-15 NOTE — Progress Notes (Signed)
BP 114/60 (BP Location: Left Arm, Patient Position: Sitting, Cuff Size: Normal)   Pulse 61   Temp 97.8 F (36.6 C) (Oral)   Ht _0  (1.651 m)   Wt 127 lb (57.6 kg)   SpO2 100% Comment: 2 L, pulsating  BMI 21.13 kg/m    CC: 3 mo f/u visit Subjective:    Patient ID: Christine Blanchard, female    DOB: 02/08/1928, 84 y.o.   MRN: [id number]  HPI: Christine Blanchard is a 84 y.o. female presenting on 12/15/2017 for Follow-up (Here for 3 mo f/u. Wants to discuss if she still needs rx for cough. ) and Form Completion (Has form from [facility] to be completed. )   Cough developed earlier this month, treated with mucinex 692m ER bid. This is improving.   Weight continues dropping.  She does appreciate hospice workers, doesn't enjoy [facility] [facility]  "I'm tired and just as soon this journey be over".   Hospice recommended personal aide 2 hours at night time.  No appetite Persistent axillary bumps   Relevant past medical, surgical, family and social history reviewed and updated as indicated. Interim medical history since our last visit reviewed. Allergies and medications reviewed and updated. Outpatient Medications Prior to Visit  Medication Sig Dispense Refill  . acetaminophen (TYLENOL) 500 MG tablet Take 500 mg by mouth 3 (three) times daily.    . ASPIRIN LOW DOSE 81 MG chewable tablet TAKE 1 TABLET BY MOUTH ONCE DAILY 30 tablet 6  . atorvastatin (LIPITOR) 20 MG tablet TAKE 1 TABLET NIGHTLY 90 tablet 2  . BD AUTOSHIELD DUO 30G X 5 MM MISC Use as instructed with insulin Dx: E11.21 100 each 3  . cetirizine (ZYRTEC) 10 MG tablet Take 1 tablet (10 mg total) by mouth daily as needed for allergies. 30 tablet 5  . clopidogrel (PLAVIX) 75 MG tablet TAKE 1 TABLET DAILY 90 tablet 3  . clotrimazole (LOTRIMIN) 1 % cream Apply 1 application topically 2 (two) times daily. 60 g 0  . docusate sodium (COLACE) 100 MG capsule Take 1 capsule (100 mg total) by mouth daily. (Patient taking differently: Take 100 mg  by mouth daily. Taking 1 tablet twice a day) 90 capsule 1  . glucose blood (FREESTYLE LITE) test strip Use to check sugar three times daily Dx: 250.40 100 each 3  . glucose monitoring kit (FREESTYLE) monitoring kit 1 each by Does not apply route as needed for other. 1 each 0  . isosorbide mononitrate (IMDUR) 60 MG 24 hr tablet TAKE 1 TABLET AT BEDTIME 90 tablet 0  . KLOR-CON M10 10 MEQ tablet TAKE 2 TABLETS DAILY 180 tablet 1  . Lancets (FREESTYLE) lancets USE TO CHECK SUGAR THREE TIMES A DAY 300 each 2  . LANTUS SOLOSTAR 100 UNIT/ML Solostar Pen INJECT 10 UNITS UNDER THE SKIN DAILY WITH BREAKFAST (DIRECTION CHANGE) 15 mL 6  . LORazepam (ATIVAN) 0.5 MG tablet Take 1 tablet (0.5 mg total) by mouth daily. At noon 30 tablet 3  . LORazepam (ATIVAN) 1 MG tablet Take 1 tablet (1 mg total) by mouth at bedtime. At 10pm 30 tablet 3  . meclizine (ANTIVERT) 12.5 MG tablet One tablet by mouth daily as needed for vertigo 30 tablet 0  . metoprolol tartrate (LOPRESSOR) 25 MG tablet TAKE 1 TABLET TWICE A DAY 180 tablet 3  . Multiple Vitamin (MULTIVITAMIN) capsule Take 1 capsule by mouth daily.      . nitroGLYCERIN (NITROSTAT) 0.4 MG SL tablet DISSOLVE 1  TABLET UNDER THE TONGUE AS NEEDED 25 tablet 2  . nystatin cream (MYCOSTATIN) Apply 1 application topically 2 (two) times daily. 30 g 1  . ondansetron (ZOFRAN) 4 MG tablet Take 1 tablet (4 mg total) by mouth 2 (two) times daily as needed for nausea or vomiting. 30 tablet 1  . pantoprazole (PROTONIX) 40 MG tablet TAKE 1 TABLET DAILY 90 tablet 1  . prednisoLONE sodium phosphate (INFLAMASE FORTE) 1 % ophthalmic solution Place 1 drop into both eyes daily. 5 mL 3  . ranitidine (ZANTAC) 150 MG tablet Take 1 tablet (150 mg total) by mouth at bedtime. 90 tablet 1  . sertraline (ZOLOFT) 50 MG tablet Take 1.5 tablets (75 mg total) by mouth daily. 135 tablet 0  . torsemide (DEMADEX) 20 MG tablet TAKE 2 TABLETS EVERY MORNING AND 1 EXTRA TABLET ON MONDAY, WEDNESDAY, AND FRIDAY  IF NEEDED 225 tablet 1  . traMADol (ULTRAM) 50 MG tablet TAKE ONE TABLET BY MOUTH 2 TIMES A DAY AT 3PM AND 10PM 60 tablet 3  . traZODone (DESYREL) 50 MG tablet Take 0.5 tablets (25 mg total) by mouth at bedtime. 90 tablet 3  . Vitamin D, Ergocalciferol, (DRISDOL) 50000 units CAPS capsule TAKE 1 CAPSULE EVERY WEEK 12 capsule 1  . cephALEXin (KEFLEX) 500 MG capsule Take 1 capsule (500 mg total) by mouth 3 (three) times daily. 15 capsule 0   No facility-administered medications prior to visit.      Per HPI unless specifically indicated in ROS section below Review of Systems     Objective:    BP 114/60 (BP Location: Left Arm, Patient Position: Sitting, Cuff Size: Normal)   Pulse 61   Temp 97.8 F (36.6 C) (Oral)   Ht _0  (1.651 m)   Wt 127 lb (57.6 kg)   SpO2 100% Comment: 2 L, pulsating  BMI 21.13 kg/m   Wt Readings from Last 3 Encounters:  12/15/17 127 lb (57.6 kg)  09/14/17 130 lb 8 oz (59.2 kg)  05/13/17 133 lb 8 oz (60.6 kg)    Physical Exam  Constitutional: She appears well-developed and well-nourished. No distress.  In wheelchair today.  HENT:  Mouth/Throat: Oropharynx is clear and moist. No oropharyngeal exudate.  Cardiovascular: Normal rate, regular rhythm and normal heart sounds.  No murmur heard. Pulmonary/Chest: Effort normal. No respiratory distress. She has no wheezes. She has no rales.  Somewhat coarse  Musculoskeletal: She exhibits no edema.  Psychiatric: She has a normal mood and affect.  Nursing note and vitals reviewed.  Results for orders placed or performed in visit on 12/15/17  POCT glycosylated hemoglobin (Hb A1C)  Result Value Ref Range   Hemoglobin A1C 6.4 (A) 4.0 - 5.6 %   HbA1c POC (<> result, manual entry)     HbA1c, POC (prediabetic range)     HbA1c, POC (controlled diabetic range)        Assessment & Plan:  Christine Blanchard forms filled out, med list signed and reconciled.  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 A1c well controlled. Denies hypoglycemia. Consider titration of lantus.       Relevant Orders   POCT glycosylated hemoglobin (Hb A1C) (Completed)   Skin lesions    Actually axillary lesions have significantly improved. She is using alcohol under arms in place of deodorant.       MDD (major depressive disorder), recurrent episode, moderate ([place])  Continue SSRI with trazodone at night.       Hospice care patient    Discussed progression up to now and uncertainty of illness course. Weight loss noted.       DNR (do not resuscitate)   Chronic hypoxemic respiratory failure (HCC)    Continue 2L oxygen.          No orders of the defined types were placed in this encounter.  Orders Placed This Encounter  Procedures  . POCT glycosylated hemoglobin (Hb A1C)    Follow up plan: Return in about 3 months (around 03/17/2018) for medicare wellness visit.  Ria Bush, MD

## 2017-12-15 NOTE — Assessment & Plan Note (Signed)
Actually axillary lesions have significantly improved. She is using alcohol under arms in place of deodorant.

## 2017-12-15 NOTE — Assessment & Plan Note (Addendum)
Discussed progression up to now and uncertainty of illness course. Weight loss noted.

## 2017-12-15 NOTE — Patient Instructions (Addendum)
Bumps under arms seem to be resolving! Continue current medicines.  Return in 3 months for medicare wellness visit - we will check labs at that time.  Ok to stop mucinex.

## 2017-12-15 NOTE — Assessment & Plan Note (Signed)
Continue SSRI with trazodone at night.

## 2017-12-15 NOTE — Assessment & Plan Note (Signed)
Continue 2L oxygen.

## 2017-12-15 NOTE — Assessment & Plan Note (Signed)
Chronic, stable. A1c well controlled. Denies hypoglycemia. Consider titration of lantus.

## 2017-12-16 DIAGNOSIS — J9611 Chronic respiratory failure with hypoxia: Secondary | ICD-10-CM | POA: Diagnosis not present

## 2017-12-16 DIAGNOSIS — I5032 Chronic diastolic (congestive) heart failure: Secondary | ICD-10-CM | POA: Diagnosis not present

## 2017-12-16 DIAGNOSIS — I130 Hypertensive heart and chronic kidney disease with heart failure and stage 1 through stage 4 chronic kidney disease, or unspecified chronic kidney disease: Secondary | ICD-10-CM | POA: Diagnosis not present

## 2017-12-16 DIAGNOSIS — N183 Chronic kidney disease, stage 3 (moderate): Secondary | ICD-10-CM | POA: Diagnosis not present

## 2017-12-16 DIAGNOSIS — E1122 Type 2 diabetes mellitus with diabetic chronic kidney disease: Secondary | ICD-10-CM | POA: Diagnosis not present

## 2017-12-16 DIAGNOSIS — E1121 Type 2 diabetes mellitus with diabetic nephropathy: Secondary | ICD-10-CM | POA: Diagnosis not present

## 2017-12-19 ENCOUNTER — Other Ambulatory Visit: Payer: Self-pay

## 2017-12-19 DIAGNOSIS — N183 Chronic kidney disease, stage 3 (moderate): Secondary | ICD-10-CM | POA: Diagnosis not present

## 2017-12-19 DIAGNOSIS — I130 Hypertensive heart and chronic kidney disease with heart failure and stage 1 through stage 4 chronic kidney disease, or unspecified chronic kidney disease: Secondary | ICD-10-CM | POA: Diagnosis not present

## 2017-12-19 DIAGNOSIS — I5032 Chronic diastolic (congestive) heart failure: Secondary | ICD-10-CM | POA: Diagnosis not present

## 2017-12-19 DIAGNOSIS — J9611 Chronic respiratory failure with hypoxia: Secondary | ICD-10-CM | POA: Diagnosis not present

## 2017-12-19 DIAGNOSIS — E1122 Type 2 diabetes mellitus with diabetic chronic kidney disease: Secondary | ICD-10-CM | POA: Diagnosis not present

## 2017-12-19 DIAGNOSIS — E1121 Type 2 diabetes mellitus with diabetic nephropathy: Secondary | ICD-10-CM | POA: Diagnosis not present

## 2017-12-20 DIAGNOSIS — E1122 Type 2 diabetes mellitus with diabetic chronic kidney disease: Secondary | ICD-10-CM | POA: Diagnosis not present

## 2017-12-20 DIAGNOSIS — E1121 Type 2 diabetes mellitus with diabetic nephropathy: Secondary | ICD-10-CM | POA: Diagnosis not present

## 2017-12-20 DIAGNOSIS — J9611 Chronic respiratory failure with hypoxia: Secondary | ICD-10-CM | POA: Diagnosis not present

## 2017-12-20 DIAGNOSIS — N183 Chronic kidney disease, stage 3 (moderate): Secondary | ICD-10-CM | POA: Diagnosis not present

## 2017-12-20 DIAGNOSIS — I130 Hypertensive heart and chronic kidney disease with heart failure and stage 1 through stage 4 chronic kidney disease, or unspecified chronic kidney disease: Secondary | ICD-10-CM | POA: Diagnosis not present

## 2017-12-20 DIAGNOSIS — I5032 Chronic diastolic (congestive) heart failure: Secondary | ICD-10-CM | POA: Diagnosis not present

## 2017-12-21 DIAGNOSIS — N183 Chronic kidney disease, stage 3 (moderate): Secondary | ICD-10-CM | POA: Diagnosis not present

## 2017-12-21 DIAGNOSIS — E1121 Type 2 diabetes mellitus with diabetic nephropathy: Secondary | ICD-10-CM | POA: Diagnosis not present

## 2017-12-21 DIAGNOSIS — I130 Hypertensive heart and chronic kidney disease with heart failure and stage 1 through stage 4 chronic kidney disease, or unspecified chronic kidney disease: Secondary | ICD-10-CM | POA: Diagnosis not present

## 2017-12-21 DIAGNOSIS — E1122 Type 2 diabetes mellitus with diabetic chronic kidney disease: Secondary | ICD-10-CM | POA: Diagnosis not present

## 2017-12-21 DIAGNOSIS — I5032 Chronic diastolic (congestive) heart failure: Secondary | ICD-10-CM | POA: Diagnosis not present

## 2017-12-21 DIAGNOSIS — J9611 Chronic respiratory failure with hypoxia: Secondary | ICD-10-CM | POA: Diagnosis not present

## 2017-12-22 DIAGNOSIS — I5032 Chronic diastolic (congestive) heart failure: Secondary | ICD-10-CM | POA: Diagnosis not present

## 2017-12-22 DIAGNOSIS — I130 Hypertensive heart and chronic kidney disease with heart failure and stage 1 through stage 4 chronic kidney disease, or unspecified chronic kidney disease: Secondary | ICD-10-CM | POA: Diagnosis not present

## 2017-12-22 DIAGNOSIS — J9611 Chronic respiratory failure with hypoxia: Secondary | ICD-10-CM | POA: Diagnosis not present

## 2017-12-22 DIAGNOSIS — N183 Chronic kidney disease, stage 3 (moderate): Secondary | ICD-10-CM | POA: Diagnosis not present

## 2017-12-22 DIAGNOSIS — E1122 Type 2 diabetes mellitus with diabetic chronic kidney disease: Secondary | ICD-10-CM | POA: Diagnosis not present

## 2017-12-22 DIAGNOSIS — E1121 Type 2 diabetes mellitus with diabetic nephropathy: Secondary | ICD-10-CM | POA: Diagnosis not present

## 2017-12-23 DIAGNOSIS — E1122 Type 2 diabetes mellitus with diabetic chronic kidney disease: Secondary | ICD-10-CM | POA: Diagnosis not present

## 2017-12-23 DIAGNOSIS — N183 Chronic kidney disease, stage 3 (moderate): Secondary | ICD-10-CM | POA: Diagnosis not present

## 2017-12-23 DIAGNOSIS — E1121 Type 2 diabetes mellitus with diabetic nephropathy: Secondary | ICD-10-CM | POA: Diagnosis not present

## 2017-12-23 DIAGNOSIS — I130 Hypertensive heart and chronic kidney disease with heart failure and stage 1 through stage 4 chronic kidney disease, or unspecified chronic kidney disease: Secondary | ICD-10-CM | POA: Diagnosis not present

## 2017-12-23 DIAGNOSIS — I5032 Chronic diastolic (congestive) heart failure: Secondary | ICD-10-CM | POA: Diagnosis not present

## 2017-12-23 DIAGNOSIS — J9611 Chronic respiratory failure with hypoxia: Secondary | ICD-10-CM | POA: Diagnosis not present

## 2017-12-26 DIAGNOSIS — J9611 Chronic respiratory failure with hypoxia: Secondary | ICD-10-CM | POA: Diagnosis not present

## 2017-12-26 DIAGNOSIS — E1121 Type 2 diabetes mellitus with diabetic nephropathy: Secondary | ICD-10-CM | POA: Diagnosis not present

## 2017-12-26 DIAGNOSIS — N183 Chronic kidney disease, stage 3 (moderate): Secondary | ICD-10-CM | POA: Diagnosis not present

## 2017-12-26 DIAGNOSIS — I130 Hypertensive heart and chronic kidney disease with heart failure and stage 1 through stage 4 chronic kidney disease, or unspecified chronic kidney disease: Secondary | ICD-10-CM | POA: Diagnosis not present

## 2017-12-26 DIAGNOSIS — E1122 Type 2 diabetes mellitus with diabetic chronic kidney disease: Secondary | ICD-10-CM | POA: Diagnosis not present

## 2017-12-26 DIAGNOSIS — I5032 Chronic diastolic (congestive) heart failure: Secondary | ICD-10-CM | POA: Diagnosis not present

## 2017-12-27 DIAGNOSIS — I130 Hypertensive heart and chronic kidney disease with heart failure and stage 1 through stage 4 chronic kidney disease, or unspecified chronic kidney disease: Secondary | ICD-10-CM | POA: Diagnosis not present

## 2017-12-27 DIAGNOSIS — I5032 Chronic diastolic (congestive) heart failure: Secondary | ICD-10-CM | POA: Diagnosis not present

## 2017-12-27 DIAGNOSIS — N183 Chronic kidney disease, stage 3 (moderate): Secondary | ICD-10-CM | POA: Diagnosis not present

## 2017-12-27 DIAGNOSIS — E1122 Type 2 diabetes mellitus with diabetic chronic kidney disease: Secondary | ICD-10-CM | POA: Diagnosis not present

## 2017-12-27 DIAGNOSIS — J9611 Chronic respiratory failure with hypoxia: Secondary | ICD-10-CM | POA: Diagnosis not present

## 2017-12-27 DIAGNOSIS — E1121 Type 2 diabetes mellitus with diabetic nephropathy: Secondary | ICD-10-CM | POA: Diagnosis not present

## 2017-12-28 DIAGNOSIS — E1121 Type 2 diabetes mellitus with diabetic nephropathy: Secondary | ICD-10-CM | POA: Diagnosis not present

## 2017-12-28 DIAGNOSIS — J9611 Chronic respiratory failure with hypoxia: Secondary | ICD-10-CM | POA: Diagnosis not present

## 2017-12-28 DIAGNOSIS — N183 Chronic kidney disease, stage 3 (moderate): Secondary | ICD-10-CM | POA: Diagnosis not present

## 2017-12-28 DIAGNOSIS — I5032 Chronic diastolic (congestive) heart failure: Secondary | ICD-10-CM | POA: Diagnosis not present

## 2017-12-28 DIAGNOSIS — E1122 Type 2 diabetes mellitus with diabetic chronic kidney disease: Secondary | ICD-10-CM | POA: Diagnosis not present

## 2017-12-28 DIAGNOSIS — I130 Hypertensive heart and chronic kidney disease with heart failure and stage 1 through stage 4 chronic kidney disease, or unspecified chronic kidney disease: Secondary | ICD-10-CM | POA: Diagnosis not present

## 2017-12-29 ENCOUNTER — Other Ambulatory Visit: Payer: Self-pay | Admitting: Internal Medicine

## 2017-12-29 DIAGNOSIS — I130 Hypertensive heart and chronic kidney disease with heart failure and stage 1 through stage 4 chronic kidney disease, or unspecified chronic kidney disease: Secondary | ICD-10-CM | POA: Diagnosis not present

## 2017-12-29 DIAGNOSIS — N183 Chronic kidney disease, stage 3 (moderate): Secondary | ICD-10-CM | POA: Diagnosis not present

## 2017-12-29 DIAGNOSIS — E1121 Type 2 diabetes mellitus with diabetic nephropathy: Secondary | ICD-10-CM | POA: Diagnosis not present

## 2017-12-29 DIAGNOSIS — I5032 Chronic diastolic (congestive) heart failure: Secondary | ICD-10-CM | POA: Diagnosis not present

## 2017-12-29 DIAGNOSIS — E1122 Type 2 diabetes mellitus with diabetic chronic kidney disease: Secondary | ICD-10-CM | POA: Diagnosis not present

## 2017-12-29 DIAGNOSIS — J9611 Chronic respiratory failure with hypoxia: Secondary | ICD-10-CM

## 2017-12-29 MED ORDER — TRAMADOL HCL 50 MG PO TABS: ORAL_TABLET | ORAL | 0 refills | Status: DC

## 2017-12-30 DIAGNOSIS — I130 Hypertensive heart and chronic kidney disease with heart failure and stage 1 through stage 4 chronic kidney disease, or unspecified chronic kidney disease: Secondary | ICD-10-CM | POA: Diagnosis not present

## 2017-12-30 DIAGNOSIS — J9611 Chronic respiratory failure with hypoxia: Secondary | ICD-10-CM | POA: Diagnosis not present

## 2017-12-30 DIAGNOSIS — N183 Chronic kidney disease, stage 3 (moderate): Secondary | ICD-10-CM | POA: Diagnosis not present

## 2017-12-30 DIAGNOSIS — E1122 Type 2 diabetes mellitus with diabetic chronic kidney disease: Secondary | ICD-10-CM | POA: Diagnosis not present

## 2017-12-30 DIAGNOSIS — I5032 Chronic diastolic (congestive) heart failure: Secondary | ICD-10-CM | POA: Diagnosis not present

## 2017-12-30 DIAGNOSIS — E1121 Type 2 diabetes mellitus with diabetic nephropathy: Secondary | ICD-10-CM | POA: Diagnosis not present

## 2018-01-01 DIAGNOSIS — E1122 Type 2 diabetes mellitus with diabetic chronic kidney disease: Secondary | ICD-10-CM | POA: Diagnosis not present

## 2018-01-01 DIAGNOSIS — Z947 Corneal transplant status: Secondary | ICD-10-CM | POA: Diagnosis not present

## 2018-01-01 DIAGNOSIS — R32 Unspecified urinary incontinence: Secondary | ICD-10-CM | POA: Diagnosis not present

## 2018-01-01 DIAGNOSIS — Z6820 Body mass index (BMI) 20.0-20.9, adult: Secondary | ICD-10-CM | POA: Diagnosis not present

## 2018-01-01 DIAGNOSIS — N183 Chronic kidney disease, stage 3 (moderate): Secondary | ICD-10-CM | POA: Diagnosis not present

## 2018-01-01 DIAGNOSIS — J9611 Chronic respiratory failure with hypoxia: Secondary | ICD-10-CM | POA: Diagnosis not present

## 2018-01-01 DIAGNOSIS — Z9981 Dependence on supplemental oxygen: Secondary | ICD-10-CM | POA: Diagnosis not present

## 2018-01-01 DIAGNOSIS — I25700 Atherosclerosis of coronary artery bypass graft(s), unspecified, with unstable angina pectoris: Secondary | ICD-10-CM | POA: Diagnosis not present

## 2018-01-01 DIAGNOSIS — Z9181 History of falling: Secondary | ICD-10-CM | POA: Diagnosis not present

## 2018-01-01 DIAGNOSIS — E785 Hyperlipidemia, unspecified: Secondary | ICD-10-CM | POA: Diagnosis not present

## 2018-01-01 DIAGNOSIS — B379 Candidiasis, unspecified: Secondary | ICD-10-CM | POA: Diagnosis not present

## 2018-01-01 DIAGNOSIS — I130 Hypertensive heart and chronic kidney disease with heart failure and stage 1 through stage 4 chronic kidney disease, or unspecified chronic kidney disease: Secondary | ICD-10-CM | POA: Diagnosis not present

## 2018-01-01 DIAGNOSIS — Z741 Need for assistance with personal care: Secondary | ICD-10-CM | POA: Diagnosis not present

## 2018-01-01 DIAGNOSIS — Z7901 Long term (current) use of anticoagulants: Secondary | ICD-10-CM | POA: Diagnosis not present

## 2018-01-01 DIAGNOSIS — I5032 Chronic diastolic (congestive) heart failure: Secondary | ICD-10-CM | POA: Diagnosis not present

## 2018-01-01 DIAGNOSIS — Z7984 Long term (current) use of oral hypoglycemic drugs: Secondary | ICD-10-CM | POA: Diagnosis not present

## 2018-01-01 DIAGNOSIS — E1121 Type 2 diabetes mellitus with diabetic nephropathy: Secondary | ICD-10-CM | POA: Diagnosis not present

## 2018-01-01 DIAGNOSIS — Z7902 Long term (current) use of antithrombotics/antiplatelets: Secondary | ICD-10-CM | POA: Diagnosis not present

## 2018-01-02 ENCOUNTER — Telehealth: Payer: Self-pay | Admitting: Family Medicine

## 2018-01-02 ENCOUNTER — Telehealth: Payer: Self-pay

## 2018-01-02 ENCOUNTER — Other Ambulatory Visit: Payer: Self-pay

## 2018-01-02 DIAGNOSIS — I130 Hypertensive heart and chronic kidney disease with heart failure and stage 1 through stage 4 chronic kidney disease, or unspecified chronic kidney disease: Secondary | ICD-10-CM | POA: Diagnosis not present

## 2018-01-02 DIAGNOSIS — I25700 Atherosclerosis of coronary artery bypass graft(s), unspecified, with unstable angina pectoris: Secondary | ICD-10-CM | POA: Diagnosis not present

## 2018-01-02 DIAGNOSIS — E1122 Type 2 diabetes mellitus with diabetic chronic kidney disease: Secondary | ICD-10-CM | POA: Diagnosis not present

## 2018-01-02 DIAGNOSIS — I5032 Chronic diastolic (congestive) heart failure: Secondary | ICD-10-CM | POA: Diagnosis not present

## 2018-01-02 DIAGNOSIS — N183 Chronic kidney disease, stage 3 (moderate): Secondary | ICD-10-CM | POA: Diagnosis not present

## 2018-01-02 DIAGNOSIS — E1121 Type 2 diabetes mellitus with diabetic nephropathy: Secondary | ICD-10-CM | POA: Diagnosis not present

## 2018-01-02 NOTE — Telephone Encounter (Signed)
error.not needed

## 2018-01-02 NOTE — Telephone Encounter (Signed)
Sherry with Hospice said med tech at [facility] [facility] advised Christine Blanchard pt needs refills on lorazepam 0.5 mg and lorazepam 1 mg. Both meds were sent electronically on 11/22/17 for # 30 x 3 to [facility]. Christine Blanchard voiced understanding and will have med tech to contact pharmacy for refills.

## 2018-01-02 NOTE — Telephone Encounter (Signed)
Marnie with Hospice called to state that patient is out of Tramadol and request a refill.   Appears on call was paged and Dr. Derrel Nip sent in an R/X for #30 of Tramadol to pharmacy ([facility]).

## 2018-01-03 DIAGNOSIS — E1122 Type 2 diabetes mellitus with diabetic chronic kidney disease: Secondary | ICD-10-CM | POA: Diagnosis not present

## 2018-01-03 DIAGNOSIS — E1121 Type 2 diabetes mellitus with diabetic nephropathy: Secondary | ICD-10-CM | POA: Diagnosis not present

## 2018-01-03 DIAGNOSIS — N183 Chronic kidney disease, stage 3 (moderate): Secondary | ICD-10-CM | POA: Diagnosis not present

## 2018-01-03 DIAGNOSIS — I130 Hypertensive heart and chronic kidney disease with heart failure and stage 1 through stage 4 chronic kidney disease, or unspecified chronic kidney disease: Secondary | ICD-10-CM | POA: Diagnosis not present

## 2018-01-03 DIAGNOSIS — I25700 Atherosclerosis of coronary artery bypass graft(s), unspecified, with unstable angina pectoris: Secondary | ICD-10-CM | POA: Diagnosis not present

## 2018-01-03 DIAGNOSIS — I5032 Chronic diastolic (congestive) heart failure: Secondary | ICD-10-CM | POA: Diagnosis not present

## 2018-01-04 DIAGNOSIS — N183 Chronic kidney disease, stage 3 (moderate): Secondary | ICD-10-CM | POA: Diagnosis not present

## 2018-01-04 DIAGNOSIS — E1122 Type 2 diabetes mellitus with diabetic chronic kidney disease: Secondary | ICD-10-CM | POA: Diagnosis not present

## 2018-01-04 DIAGNOSIS — I5032 Chronic diastolic (congestive) heart failure: Secondary | ICD-10-CM | POA: Diagnosis not present

## 2018-01-04 DIAGNOSIS — I25700 Atherosclerosis of coronary artery bypass graft(s), unspecified, with unstable angina pectoris: Secondary | ICD-10-CM | POA: Diagnosis not present

## 2018-01-04 DIAGNOSIS — I130 Hypertensive heart and chronic kidney disease with heart failure and stage 1 through stage 4 chronic kidney disease, or unspecified chronic kidney disease: Secondary | ICD-10-CM | POA: Diagnosis not present

## 2018-01-04 DIAGNOSIS — E1121 Type 2 diabetes mellitus with diabetic nephropathy: Secondary | ICD-10-CM | POA: Diagnosis not present

## 2018-01-05 DIAGNOSIS — N183 Chronic kidney disease, stage 3 (moderate): Secondary | ICD-10-CM | POA: Diagnosis not present

## 2018-01-05 DIAGNOSIS — I25700 Atherosclerosis of coronary artery bypass graft(s), unspecified, with unstable angina pectoris: Secondary | ICD-10-CM | POA: Diagnosis not present

## 2018-01-05 DIAGNOSIS — E1121 Type 2 diabetes mellitus with diabetic nephropathy: Secondary | ICD-10-CM | POA: Diagnosis not present

## 2018-01-05 DIAGNOSIS — I130 Hypertensive heart and chronic kidney disease with heart failure and stage 1 through stage 4 chronic kidney disease, or unspecified chronic kidney disease: Secondary | ICD-10-CM | POA: Diagnosis not present

## 2018-01-05 DIAGNOSIS — I5032 Chronic diastolic (congestive) heart failure: Secondary | ICD-10-CM | POA: Diagnosis not present

## 2018-01-05 DIAGNOSIS — E1122 Type 2 diabetes mellitus with diabetic chronic kidney disease: Secondary | ICD-10-CM | POA: Diagnosis not present

## 2018-01-06 DIAGNOSIS — I130 Hypertensive heart and chronic kidney disease with heart failure and stage 1 through stage 4 chronic kidney disease, or unspecified chronic kidney disease: Secondary | ICD-10-CM | POA: Diagnosis not present

## 2018-01-06 DIAGNOSIS — E1121 Type 2 diabetes mellitus with diabetic nephropathy: Secondary | ICD-10-CM | POA: Diagnosis not present

## 2018-01-06 DIAGNOSIS — N183 Chronic kidney disease, stage 3 (moderate): Secondary | ICD-10-CM | POA: Diagnosis not present

## 2018-01-06 DIAGNOSIS — I5032 Chronic diastolic (congestive) heart failure: Secondary | ICD-10-CM | POA: Diagnosis not present

## 2018-01-06 DIAGNOSIS — E1122 Type 2 diabetes mellitus with diabetic chronic kidney disease: Secondary | ICD-10-CM | POA: Diagnosis not present

## 2018-01-06 DIAGNOSIS — I25700 Atherosclerosis of coronary artery bypass graft(s), unspecified, with unstable angina pectoris: Secondary | ICD-10-CM | POA: Diagnosis not present

## 2018-01-07 DIAGNOSIS — I130 Hypertensive heart and chronic kidney disease with heart failure and stage 1 through stage 4 chronic kidney disease, or unspecified chronic kidney disease: Secondary | ICD-10-CM | POA: Diagnosis not present

## 2018-01-07 DIAGNOSIS — N183 Chronic kidney disease, stage 3 (moderate): Secondary | ICD-10-CM | POA: Diagnosis not present

## 2018-01-07 DIAGNOSIS — I5032 Chronic diastolic (congestive) heart failure: Secondary | ICD-10-CM | POA: Diagnosis not present

## 2018-01-07 DIAGNOSIS — E1122 Type 2 diabetes mellitus with diabetic chronic kidney disease: Secondary | ICD-10-CM | POA: Diagnosis not present

## 2018-01-07 DIAGNOSIS — I25700 Atherosclerosis of coronary artery bypass graft(s), unspecified, with unstable angina pectoris: Secondary | ICD-10-CM | POA: Diagnosis not present

## 2018-01-07 DIAGNOSIS — E1121 Type 2 diabetes mellitus with diabetic nephropathy: Secondary | ICD-10-CM | POA: Diagnosis not present

## 2018-01-09 DIAGNOSIS — E1121 Type 2 diabetes mellitus with diabetic nephropathy: Secondary | ICD-10-CM | POA: Diagnosis not present

## 2018-01-09 DIAGNOSIS — E1122 Type 2 diabetes mellitus with diabetic chronic kidney disease: Secondary | ICD-10-CM | POA: Diagnosis not present

## 2018-01-09 DIAGNOSIS — I25700 Atherosclerosis of coronary artery bypass graft(s), unspecified, with unstable angina pectoris: Secondary | ICD-10-CM | POA: Diagnosis not present

## 2018-01-09 DIAGNOSIS — I130 Hypertensive heart and chronic kidney disease with heart failure and stage 1 through stage 4 chronic kidney disease, or unspecified chronic kidney disease: Secondary | ICD-10-CM | POA: Diagnosis not present

## 2018-01-09 DIAGNOSIS — I5032 Chronic diastolic (congestive) heart failure: Secondary | ICD-10-CM | POA: Diagnosis not present

## 2018-01-09 DIAGNOSIS — N183 Chronic kidney disease, stage 3 (moderate): Secondary | ICD-10-CM | POA: Diagnosis not present

## 2018-01-10 DIAGNOSIS — N183 Chronic kidney disease, stage 3 (moderate): Secondary | ICD-10-CM | POA: Diagnosis not present

## 2018-01-10 DIAGNOSIS — E1121 Type 2 diabetes mellitus with diabetic nephropathy: Secondary | ICD-10-CM | POA: Diagnosis not present

## 2018-01-10 DIAGNOSIS — E1122 Type 2 diabetes mellitus with diabetic chronic kidney disease: Secondary | ICD-10-CM | POA: Diagnosis not present

## 2018-01-10 DIAGNOSIS — I5032 Chronic diastolic (congestive) heart failure: Secondary | ICD-10-CM | POA: Diagnosis not present

## 2018-01-10 DIAGNOSIS — I130 Hypertensive heart and chronic kidney disease with heart failure and stage 1 through stage 4 chronic kidney disease, or unspecified chronic kidney disease: Secondary | ICD-10-CM | POA: Diagnosis not present

## 2018-01-10 DIAGNOSIS — I25700 Atherosclerosis of coronary artery bypass graft(s), unspecified, with unstable angina pectoris: Secondary | ICD-10-CM | POA: Diagnosis not present

## 2018-01-11 ENCOUNTER — Telehealth: Payer: Self-pay | Admitting: *Deleted

## 2018-01-11 DIAGNOSIS — I25700 Atherosclerosis of coronary artery bypass graft(s), unspecified, with unstable angina pectoris: Secondary | ICD-10-CM | POA: Diagnosis not present

## 2018-01-11 DIAGNOSIS — I5032 Chronic diastolic (congestive) heart failure: Secondary | ICD-10-CM | POA: Diagnosis not present

## 2018-01-11 DIAGNOSIS — E1121 Type 2 diabetes mellitus with diabetic nephropathy: Secondary | ICD-10-CM | POA: Diagnosis not present

## 2018-01-11 DIAGNOSIS — I130 Hypertensive heart and chronic kidney disease with heart failure and stage 1 through stage 4 chronic kidney disease, or unspecified chronic kidney disease: Secondary | ICD-10-CM | POA: Diagnosis not present

## 2018-01-11 DIAGNOSIS — E1122 Type 2 diabetes mellitus with diabetic chronic kidney disease: Secondary | ICD-10-CM | POA: Diagnosis not present

## 2018-01-11 DIAGNOSIS — N183 Chronic kidney disease, stage 3 (moderate): Secondary | ICD-10-CM | POA: Diagnosis not present

## 2018-01-11 MED ORDER — CEPHALEXIN 500 MG PO CAPS: 500.0000 mg | ORAL_CAPSULE | Freq: Three times a day (TID) | ORAL | 0 refills | Status: DC

## 2018-01-11 NOTE — Telephone Encounter (Signed)
Spoke with [name], of [facility], relaying Dr. Synthia Innocent message and instructions. Verbalizes understanding.

## 2018-01-11 NOTE — Telephone Encounter (Signed)
Ok to try keflex sent to pharmacy (500mg  TID x 1 wk) Schedule OV if not improving with treatment.

## 2018-01-11 NOTE — Telephone Encounter (Signed)
Spoke to [place] with [facility] who states pt seems as though she has cellulitis in L lower leg. Site is red, swollen and warm to the touch. Requesting verbal orders for an abx and pt does well with keflex, per [name]. [facility] is pharmacy of choice if sent

## 2018-01-12 DIAGNOSIS — E1122 Type 2 diabetes mellitus with diabetic chronic kidney disease: Secondary | ICD-10-CM | POA: Diagnosis not present

## 2018-01-12 DIAGNOSIS — E1121 Type 2 diabetes mellitus with diabetic nephropathy: Secondary | ICD-10-CM | POA: Diagnosis not present

## 2018-01-12 DIAGNOSIS — I5032 Chronic diastolic (congestive) heart failure: Secondary | ICD-10-CM | POA: Diagnosis not present

## 2018-01-12 DIAGNOSIS — I25700 Atherosclerosis of coronary artery bypass graft(s), unspecified, with unstable angina pectoris: Secondary | ICD-10-CM | POA: Diagnosis not present

## 2018-01-12 DIAGNOSIS — I130 Hypertensive heart and chronic kidney disease with heart failure and stage 1 through stage 4 chronic kidney disease, or unspecified chronic kidney disease: Secondary | ICD-10-CM | POA: Diagnosis not present

## 2018-01-12 DIAGNOSIS — N183 Chronic kidney disease, stage 3 (moderate): Secondary | ICD-10-CM | POA: Diagnosis not present

## 2018-01-13 DIAGNOSIS — I130 Hypertensive heart and chronic kidney disease with heart failure and stage 1 through stage 4 chronic kidney disease, or unspecified chronic kidney disease: Secondary | ICD-10-CM | POA: Diagnosis not present

## 2018-01-13 DIAGNOSIS — E1122 Type 2 diabetes mellitus with diabetic chronic kidney disease: Secondary | ICD-10-CM | POA: Diagnosis not present

## 2018-01-13 DIAGNOSIS — I5032 Chronic diastolic (congestive) heart failure: Secondary | ICD-10-CM | POA: Diagnosis not present

## 2018-01-13 DIAGNOSIS — N183 Chronic kidney disease, stage 3 (moderate): Secondary | ICD-10-CM | POA: Diagnosis not present

## 2018-01-13 DIAGNOSIS — I25700 Atherosclerosis of coronary artery bypass graft(s), unspecified, with unstable angina pectoris: Secondary | ICD-10-CM | POA: Diagnosis not present

## 2018-01-13 DIAGNOSIS — E1121 Type 2 diabetes mellitus with diabetic nephropathy: Secondary | ICD-10-CM | POA: Diagnosis not present

## 2018-01-16 DIAGNOSIS — I25700 Atherosclerosis of coronary artery bypass graft(s), unspecified, with unstable angina pectoris: Secondary | ICD-10-CM | POA: Diagnosis not present

## 2018-01-16 DIAGNOSIS — N183 Chronic kidney disease, stage 3 (moderate): Secondary | ICD-10-CM | POA: Diagnosis not present

## 2018-01-16 DIAGNOSIS — E1122 Type 2 diabetes mellitus with diabetic chronic kidney disease: Secondary | ICD-10-CM | POA: Diagnosis not present

## 2018-01-16 DIAGNOSIS — I130 Hypertensive heart and chronic kidney disease with heart failure and stage 1 through stage 4 chronic kidney disease, or unspecified chronic kidney disease: Secondary | ICD-10-CM | POA: Diagnosis not present

## 2018-01-16 DIAGNOSIS — E1121 Type 2 diabetes mellitus with diabetic nephropathy: Secondary | ICD-10-CM | POA: Diagnosis not present

## 2018-01-16 DIAGNOSIS — I5032 Chronic diastolic (congestive) heart failure: Secondary | ICD-10-CM | POA: Diagnosis not present

## 2018-01-17 DIAGNOSIS — I130 Hypertensive heart and chronic kidney disease with heart failure and stage 1 through stage 4 chronic kidney disease, or unspecified chronic kidney disease: Secondary | ICD-10-CM | POA: Diagnosis not present

## 2018-01-17 DIAGNOSIS — N183 Chronic kidney disease, stage 3 (moderate): Secondary | ICD-10-CM | POA: Diagnosis not present

## 2018-01-17 DIAGNOSIS — E1121 Type 2 diabetes mellitus with diabetic nephropathy: Secondary | ICD-10-CM | POA: Diagnosis not present

## 2018-01-17 DIAGNOSIS — I25700 Atherosclerosis of coronary artery bypass graft(s), unspecified, with unstable angina pectoris: Secondary | ICD-10-CM | POA: Diagnosis not present

## 2018-01-17 DIAGNOSIS — I5032 Chronic diastolic (congestive) heart failure: Secondary | ICD-10-CM | POA: Diagnosis not present

## 2018-01-17 DIAGNOSIS — E1122 Type 2 diabetes mellitus with diabetic chronic kidney disease: Secondary | ICD-10-CM | POA: Diagnosis not present

## 2018-01-18 DIAGNOSIS — E1121 Type 2 diabetes mellitus with diabetic nephropathy: Secondary | ICD-10-CM | POA: Diagnosis not present

## 2018-01-18 DIAGNOSIS — E1122 Type 2 diabetes mellitus with diabetic chronic kidney disease: Secondary | ICD-10-CM | POA: Diagnosis not present

## 2018-01-18 DIAGNOSIS — I130 Hypertensive heart and chronic kidney disease with heart failure and stage 1 through stage 4 chronic kidney disease, or unspecified chronic kidney disease: Secondary | ICD-10-CM | POA: Diagnosis not present

## 2018-01-18 DIAGNOSIS — N183 Chronic kidney disease, stage 3 (moderate): Secondary | ICD-10-CM | POA: Diagnosis not present

## 2018-01-18 DIAGNOSIS — I25700 Atherosclerosis of coronary artery bypass graft(s), unspecified, with unstable angina pectoris: Secondary | ICD-10-CM | POA: Diagnosis not present

## 2018-01-18 DIAGNOSIS — I5032 Chronic diastolic (congestive) heart failure: Secondary | ICD-10-CM | POA: Diagnosis not present

## 2018-01-19 DIAGNOSIS — N183 Chronic kidney disease, stage 3 (moderate): Secondary | ICD-10-CM | POA: Diagnosis not present

## 2018-01-19 DIAGNOSIS — E1121 Type 2 diabetes mellitus with diabetic nephropathy: Secondary | ICD-10-CM | POA: Diagnosis not present

## 2018-01-19 DIAGNOSIS — I5032 Chronic diastolic (congestive) heart failure: Secondary | ICD-10-CM | POA: Diagnosis not present

## 2018-01-19 DIAGNOSIS — E1122 Type 2 diabetes mellitus with diabetic chronic kidney disease: Secondary | ICD-10-CM | POA: Diagnosis not present

## 2018-01-19 DIAGNOSIS — I25700 Atherosclerosis of coronary artery bypass graft(s), unspecified, with unstable angina pectoris: Secondary | ICD-10-CM | POA: Diagnosis not present

## 2018-01-19 DIAGNOSIS — I130 Hypertensive heart and chronic kidney disease with heart failure and stage 1 through stage 4 chronic kidney disease, or unspecified chronic kidney disease: Secondary | ICD-10-CM | POA: Diagnosis not present

## 2018-01-20 ENCOUNTER — Telehealth: Payer: Self-pay | Admitting: Family Medicine

## 2018-01-20 DIAGNOSIS — E1121 Type 2 diabetes mellitus with diabetic nephropathy: Secondary | ICD-10-CM | POA: Diagnosis not present

## 2018-01-20 DIAGNOSIS — E1122 Type 2 diabetes mellitus with diabetic chronic kidney disease: Secondary | ICD-10-CM | POA: Diagnosis not present

## 2018-01-20 DIAGNOSIS — I5032 Chronic diastolic (congestive) heart failure: Secondary | ICD-10-CM | POA: Diagnosis not present

## 2018-01-20 DIAGNOSIS — N183 Chronic kidney disease, stage 3 (moderate): Secondary | ICD-10-CM | POA: Diagnosis not present

## 2018-01-20 DIAGNOSIS — I25700 Atherosclerosis of coronary artery bypass graft(s), unspecified, with unstable angina pectoris: Secondary | ICD-10-CM | POA: Diagnosis not present

## 2018-01-20 DIAGNOSIS — I130 Hypertensive heart and chronic kidney disease with heart failure and stage 1 through stage 4 chronic kidney disease, or unspecified chronic kidney disease: Secondary | ICD-10-CM | POA: Diagnosis not present

## 2018-01-20 NOTE — Telephone Encounter (Signed)
Want to know the status of the Physician orders that was sent 12.17.19. please advise

## 2018-01-20 NOTE — Telephone Encounter (Signed)
Did you receive orders for pt?

## 2018-01-23 ENCOUNTER — Telehealth: Payer: Self-pay | Admitting: *Deleted

## 2018-01-23 DIAGNOSIS — N183 Chronic kidney disease, stage 3 (moderate): Secondary | ICD-10-CM | POA: Diagnosis not present

## 2018-01-23 DIAGNOSIS — I25700 Atherosclerosis of coronary artery bypass graft(s), unspecified, with unstable angina pectoris: Secondary | ICD-10-CM | POA: Diagnosis not present

## 2018-01-23 DIAGNOSIS — E1121 Type 2 diabetes mellitus with diabetic nephropathy: Secondary | ICD-10-CM | POA: Diagnosis not present

## 2018-01-23 DIAGNOSIS — I130 Hypertensive heart and chronic kidney disease with heart failure and stage 1 through stage 4 chronic kidney disease, or unspecified chronic kidney disease: Secondary | ICD-10-CM | POA: Diagnosis not present

## 2018-01-23 DIAGNOSIS — I5032 Chronic diastolic (congestive) heart failure: Secondary | ICD-10-CM | POA: Diagnosis not present

## 2018-01-23 DIAGNOSIS — E1122 Type 2 diabetes mellitus with diabetic chronic kidney disease: Secondary | ICD-10-CM | POA: Diagnosis not present

## 2018-01-23 MED ORDER — LORAZEPAM 1 MG PO TABS: 1.0000 mg | ORAL_TABLET | Freq: Every day | ORAL | 3 refills | Status: DC

## 2018-01-23 MED ORDER — LORAZEPAM 0.5 MG PO TABS: 0.5000 mg | ORAL_TABLET | Freq: Every day | ORAL | 3 refills | Status: DC

## 2018-01-23 MED ORDER — FLUCONAZOLE 150 MG PO TABS: 150.0000 mg | ORAL_TABLET | Freq: Once | ORAL | 0 refills | Status: AC

## 2018-01-23 MED ORDER — TRAMADOL HCL 50 MG PO TABS: ORAL_TABLET | ORAL | 3 refills | Status: DC

## 2018-01-23 NOTE — Telephone Encounter (Signed)
Signed and in my out box. New pharmacy will be [facility] ([place]).  E prescribed tramadol and lorazepam.

## 2018-01-23 NOTE — Telephone Encounter (Signed)
Spoke with Christine Blanchard with hospice requesting rx for diflucan 100mg  tab. Pt was recently on abx and c/o vaginal itching.Rx can be sent to [facility]

## 2018-01-23 NOTE — Telephone Encounter (Signed)
Sent in 150mg  diflucan. i'm not familiar with 100mg  dosing.

## 2018-01-23 NOTE — Telephone Encounter (Signed)
Left message on vm for [facility] relaying Dr. Synthia Innocent message.

## 2018-01-24 DIAGNOSIS — I25700 Atherosclerosis of coronary artery bypass graft(s), unspecified, with unstable angina pectoris: Secondary | ICD-10-CM | POA: Diagnosis not present

## 2018-01-24 DIAGNOSIS — I130 Hypertensive heart and chronic kidney disease with heart failure and stage 1 through stage 4 chronic kidney disease, or unspecified chronic kidney disease: Secondary | ICD-10-CM | POA: Diagnosis not present

## 2018-01-24 DIAGNOSIS — I5032 Chronic diastolic (congestive) heart failure: Secondary | ICD-10-CM | POA: Diagnosis not present

## 2018-01-24 DIAGNOSIS — E1122 Type 2 diabetes mellitus with diabetic chronic kidney disease: Secondary | ICD-10-CM | POA: Diagnosis not present

## 2018-01-24 DIAGNOSIS — N183 Chronic kidney disease, stage 3 (moderate): Secondary | ICD-10-CM | POA: Diagnosis not present

## 2018-01-24 DIAGNOSIS — E1121 Type 2 diabetes mellitus with diabetic nephropathy: Secondary | ICD-10-CM | POA: Diagnosis not present

## 2018-01-24 NOTE — Telephone Encounter (Signed)
Noted. Per Morey Hummingbird, orders were faxed.

## 2018-01-25 DIAGNOSIS — N183 Chronic kidney disease, stage 3 (moderate): Secondary | ICD-10-CM | POA: Diagnosis not present

## 2018-01-25 DIAGNOSIS — E1122 Type 2 diabetes mellitus with diabetic chronic kidney disease: Secondary | ICD-10-CM | POA: Diagnosis not present

## 2018-01-25 DIAGNOSIS — I25700 Atherosclerosis of coronary artery bypass graft(s), unspecified, with unstable angina pectoris: Secondary | ICD-10-CM | POA: Diagnosis not present

## 2018-01-25 DIAGNOSIS — E1121 Type 2 diabetes mellitus with diabetic nephropathy: Secondary | ICD-10-CM | POA: Diagnosis not present

## 2018-01-25 DIAGNOSIS — I130 Hypertensive heart and chronic kidney disease with heart failure and stage 1 through stage 4 chronic kidney disease, or unspecified chronic kidney disease: Secondary | ICD-10-CM | POA: Diagnosis not present

## 2018-01-25 DIAGNOSIS — I5032 Chronic diastolic (congestive) heart failure: Secondary | ICD-10-CM | POA: Diagnosis not present

## 2018-01-26 DIAGNOSIS — I25700 Atherosclerosis of coronary artery bypass graft(s), unspecified, with unstable angina pectoris: Secondary | ICD-10-CM | POA: Diagnosis not present

## 2018-01-26 DIAGNOSIS — I5032 Chronic diastolic (congestive) heart failure: Secondary | ICD-10-CM | POA: Diagnosis not present

## 2018-01-26 DIAGNOSIS — I130 Hypertensive heart and chronic kidney disease with heart failure and stage 1 through stage 4 chronic kidney disease, or unspecified chronic kidney disease: Secondary | ICD-10-CM | POA: Diagnosis not present

## 2018-01-26 DIAGNOSIS — N183 Chronic kidney disease, stage 3 (moderate): Secondary | ICD-10-CM | POA: Diagnosis not present

## 2018-01-26 DIAGNOSIS — E1122 Type 2 diabetes mellitus with diabetic chronic kidney disease: Secondary | ICD-10-CM | POA: Diagnosis not present

## 2018-01-26 DIAGNOSIS — E1121 Type 2 diabetes mellitus with diabetic nephropathy: Secondary | ICD-10-CM | POA: Diagnosis not present

## 2018-01-27 DIAGNOSIS — N183 Chronic kidney disease, stage 3 (moderate): Secondary | ICD-10-CM | POA: Diagnosis not present

## 2018-01-27 DIAGNOSIS — E1121 Type 2 diabetes mellitus with diabetic nephropathy: Secondary | ICD-10-CM | POA: Diagnosis not present

## 2018-01-27 DIAGNOSIS — I130 Hypertensive heart and chronic kidney disease with heart failure and stage 1 through stage 4 chronic kidney disease, or unspecified chronic kidney disease: Secondary | ICD-10-CM | POA: Diagnosis not present

## 2018-01-27 DIAGNOSIS — E1122 Type 2 diabetes mellitus with diabetic chronic kidney disease: Secondary | ICD-10-CM | POA: Diagnosis not present

## 2018-01-27 DIAGNOSIS — I25700 Atherosclerosis of coronary artery bypass graft(s), unspecified, with unstable angina pectoris: Secondary | ICD-10-CM | POA: Diagnosis not present

## 2018-01-27 DIAGNOSIS — I5032 Chronic diastolic (congestive) heart failure: Secondary | ICD-10-CM | POA: Diagnosis not present

## 2018-01-30 ENCOUNTER — Other Ambulatory Visit: Payer: Self-pay

## 2018-01-30 DIAGNOSIS — E1122 Type 2 diabetes mellitus with diabetic chronic kidney disease: Secondary | ICD-10-CM | POA: Diagnosis not present

## 2018-01-30 DIAGNOSIS — I130 Hypertensive heart and chronic kidney disease with heart failure and stage 1 through stage 4 chronic kidney disease, or unspecified chronic kidney disease: Secondary | ICD-10-CM | POA: Diagnosis not present

## 2018-01-30 DIAGNOSIS — I25700 Atherosclerosis of coronary artery bypass graft(s), unspecified, with unstable angina pectoris: Secondary | ICD-10-CM | POA: Diagnosis not present

## 2018-01-30 DIAGNOSIS — N183 Chronic kidney disease, stage 3 (moderate): Secondary | ICD-10-CM | POA: Diagnosis not present

## 2018-01-30 DIAGNOSIS — E1121 Type 2 diabetes mellitus with diabetic nephropathy: Secondary | ICD-10-CM | POA: Diagnosis not present

## 2018-01-30 DIAGNOSIS — I5032 Chronic diastolic (congestive) heart failure: Secondary | ICD-10-CM | POA: Diagnosis not present

## 2018-01-30 NOTE — Telephone Encounter (Signed)
Received faxed new rx requests from [facility] for rxs marked and:  -SF 5000 plus cream, 1 g   Brush teeth thoroughly for at least 2 minutes. Do not   rinse afterwards (Resident  May self administer) -Micro-Guard 2% powder, 1 g   Apply under left breast topically 4 (four) times a day -Zinc oxide 20% ointment   Apply topically to perineal area 3 (three) times a day      until rash is resolved

## 2018-01-31 DIAGNOSIS — E1121 Type 2 diabetes mellitus with diabetic nephropathy: Secondary | ICD-10-CM | POA: Diagnosis not present

## 2018-01-31 DIAGNOSIS — E1122 Type 2 diabetes mellitus with diabetic chronic kidney disease: Secondary | ICD-10-CM | POA: Diagnosis not present

## 2018-01-31 DIAGNOSIS — I130 Hypertensive heart and chronic kidney disease with heart failure and stage 1 through stage 4 chronic kidney disease, or unspecified chronic kidney disease: Secondary | ICD-10-CM | POA: Diagnosis not present

## 2018-01-31 DIAGNOSIS — I5032 Chronic diastolic (congestive) heart failure: Secondary | ICD-10-CM | POA: Diagnosis not present

## 2018-01-31 DIAGNOSIS — N183 Chronic kidney disease, stage 3 (moderate): Secondary | ICD-10-CM | POA: Diagnosis not present

## 2018-01-31 DIAGNOSIS — I25700 Atherosclerosis of coronary artery bypass graft(s), unspecified, with unstable angina pectoris: Secondary | ICD-10-CM | POA: Diagnosis not present

## 2018-01-31 MED ORDER — INSULIN GLARGINE 100 UNIT/ML SOLOSTAR PEN: PEN_INJECTOR | SUBCUTANEOUS | 6 refills | Status: AC

## 2018-01-31 MED ORDER — BD AUTOSHIELD DUO 30G X 5 MM MISC: 3 refills | Status: DC

## 2018-01-31 NOTE — Telephone Encounter (Signed)
Declined eye drops - steroid eye drops will need to come through ophthalmologist.

## 2018-02-01 DIAGNOSIS — Z7984 Long term (current) use of oral hypoglycemic drugs: Secondary | ICD-10-CM | POA: Diagnosis not present

## 2018-02-01 DIAGNOSIS — R32 Unspecified urinary incontinence: Secondary | ICD-10-CM | POA: Diagnosis not present

## 2018-02-01 DIAGNOSIS — N183 Chronic kidney disease, stage 3 (moderate): Secondary | ICD-10-CM | POA: Diagnosis not present

## 2018-02-01 DIAGNOSIS — Z7902 Long term (current) use of antithrombotics/antiplatelets: Secondary | ICD-10-CM | POA: Diagnosis not present

## 2018-02-01 DIAGNOSIS — J9611 Chronic respiratory failure with hypoxia: Secondary | ICD-10-CM | POA: Diagnosis not present

## 2018-02-01 DIAGNOSIS — Z6820 Body mass index (BMI) 20.0-20.9, adult: Secondary | ICD-10-CM | POA: Diagnosis not present

## 2018-02-01 DIAGNOSIS — B379 Candidiasis, unspecified: Secondary | ICD-10-CM | POA: Diagnosis not present

## 2018-02-01 DIAGNOSIS — I5032 Chronic diastolic (congestive) heart failure: Secondary | ICD-10-CM | POA: Diagnosis not present

## 2018-02-01 DIAGNOSIS — Z741 Need for assistance with personal care: Secondary | ICD-10-CM | POA: Diagnosis not present

## 2018-02-01 DIAGNOSIS — E1122 Type 2 diabetes mellitus with diabetic chronic kidney disease: Secondary | ICD-10-CM | POA: Diagnosis not present

## 2018-02-01 DIAGNOSIS — Z9181 History of falling: Secondary | ICD-10-CM | POA: Diagnosis not present

## 2018-02-01 DIAGNOSIS — I25700 Atherosclerosis of coronary artery bypass graft(s), unspecified, with unstable angina pectoris: Secondary | ICD-10-CM | POA: Diagnosis not present

## 2018-02-01 DIAGNOSIS — E785 Hyperlipidemia, unspecified: Secondary | ICD-10-CM | POA: Diagnosis not present

## 2018-02-01 DIAGNOSIS — Z947 Corneal transplant status: Secondary | ICD-10-CM | POA: Diagnosis not present

## 2018-02-01 DIAGNOSIS — Z7901 Long term (current) use of anticoagulants: Secondary | ICD-10-CM | POA: Diagnosis not present

## 2018-02-01 DIAGNOSIS — Z9981 Dependence on supplemental oxygen: Secondary | ICD-10-CM | POA: Diagnosis not present

## 2018-02-01 DIAGNOSIS — I130 Hypertensive heart and chronic kidney disease with heart failure and stage 1 through stage 4 chronic kidney disease, or unspecified chronic kidney disease: Secondary | ICD-10-CM | POA: Diagnosis not present

## 2018-02-01 DIAGNOSIS — E1121 Type 2 diabetes mellitus with diabetic nephropathy: Secondary | ICD-10-CM | POA: Diagnosis not present

## 2018-02-02 ENCOUNTER — Telehealth: Payer: Self-pay | Admitting: Family Medicine

## 2018-02-02 DIAGNOSIS — N183 Chronic kidney disease, stage 3 (moderate): Secondary | ICD-10-CM | POA: Diagnosis not present

## 2018-02-02 DIAGNOSIS — I5032 Chronic diastolic (congestive) heart failure: Secondary | ICD-10-CM | POA: Diagnosis not present

## 2018-02-02 DIAGNOSIS — I25700 Atherosclerosis of coronary artery bypass graft(s), unspecified, with unstable angina pectoris: Secondary | ICD-10-CM | POA: Diagnosis not present

## 2018-02-02 DIAGNOSIS — E1121 Type 2 diabetes mellitus with diabetic nephropathy: Secondary | ICD-10-CM | POA: Diagnosis not present

## 2018-02-02 DIAGNOSIS — I130 Hypertensive heart and chronic kidney disease with heart failure and stage 1 through stage 4 chronic kidney disease, or unspecified chronic kidney disease: Secondary | ICD-10-CM | POA: Diagnosis not present

## 2018-02-02 DIAGNOSIS — E1122 Type 2 diabetes mellitus with diabetic chronic kidney disease: Secondary | ICD-10-CM | POA: Diagnosis not present

## 2018-02-02 NOTE — Telephone Encounter (Signed)
Ok to do this - urinalysis with micro and reflex to gram stain/culture if abnormal. Please schedule zyrtec once daily for 2 wks.  Please clarify - Ativan #30 and Tramadol #60 already sent with 3 refills to [facility] 01/23/2018 - shouldn't need refill yet?

## 2018-02-02 NOTE — Telephone Encounter (Signed)
Stacey/[facility] called office to report to Dr.G that the pt has increased weakness/trimmers and increased confusion. She is also requesting an order to be put in for a urine sample for UACNS. A refill for the Ativan 0.5mg  and 1mg  needs to be sent to [facility] in [place]. She also requesting the Tramadol be sent to [facility] as well. Previously it was sent to [facility] because [facility] hadn't started their contract with them. [name] is also requesting to schedule the pt's allergy pill once a day for 14 days.

## 2018-02-03 ENCOUNTER — Encounter: Payer: Self-pay | Admitting: Family Medicine

## 2018-02-03 ENCOUNTER — Telehealth: Payer: Self-pay | Admitting: *Deleted

## 2018-02-03 DIAGNOSIS — N183 Chronic kidney disease, stage 3 (moderate): Secondary | ICD-10-CM | POA: Diagnosis not present

## 2018-02-03 DIAGNOSIS — I5032 Chronic diastolic (congestive) heart failure: Secondary | ICD-10-CM | POA: Diagnosis not present

## 2018-02-03 DIAGNOSIS — I130 Hypertensive heart and chronic kidney disease with heart failure and stage 1 through stage 4 chronic kidney disease, or unspecified chronic kidney disease: Secondary | ICD-10-CM | POA: Diagnosis not present

## 2018-02-03 DIAGNOSIS — E1122 Type 2 diabetes mellitus with diabetic chronic kidney disease: Secondary | ICD-10-CM | POA: Diagnosis not present

## 2018-02-03 DIAGNOSIS — I25700 Atherosclerosis of coronary artery bypass graft(s), unspecified, with unstable angina pectoris: Secondary | ICD-10-CM | POA: Diagnosis not present

## 2018-02-03 DIAGNOSIS — E1121 Type 2 diabetes mellitus with diabetic nephropathy: Secondary | ICD-10-CM | POA: Diagnosis not present

## 2018-02-03 MED ORDER — TRAMADOL HCL 50 MG PO TABS: ORAL_TABLET | ORAL | 3 refills | Status: DC

## 2018-02-03 NOTE — Telephone Encounter (Signed)
Noted  

## 2018-02-03 NOTE — Telephone Encounter (Signed)
Tramadol sent to new pharmacy.

## 2018-02-03 NOTE — Addendum Note (Signed)
Addended by: Ria Bush on: 02/03/2018 08:38 AM   Modules accepted: Orders

## 2018-02-03 NOTE — Telephone Encounter (Signed)
Spoke with [name], of [facility], relaying Dr. Synthia Innocent message and instructions. Stacey verbalizes understanding.  Also, clarifies the Ativan rx is ok. But they had to fill the tramadol before 02/01/18 with the old pharmacy to have some at the facility, therefore that canceled out the 01/23/18 refill sent to new pharmacy [[facility]].  Now they will need new rx sent to [facility].

## 2018-02-03 NOTE — Telephone Encounter (Signed)
Left message on vm for [facility] relaying Dr. Synthia Innocent message.

## 2018-02-03 NOTE — Telephone Encounter (Signed)
Ok to do this if not already done.

## 2018-02-03 NOTE — Telephone Encounter (Signed)
Christine Blanchard with [facility] said that she had already gotten message from [place] about doing U/A and culture and Christine Blanchard had sent it off but today pt is more confused; BS going up and down and pt is tired. pts family has decided does not want anything for pt except comfort measures. (comfort care only)

## 2018-02-03 NOTE — Telephone Encounter (Addendum)
Dr. Synthia Innocent message had already been given to [facility]. Disregard.

## 2018-02-03 NOTE — Telephone Encounter (Signed)
Fax received from [facility] stating call received from [place] with [facility] requesting orders for urinalysis and culture if needed. pls advise

## 2018-02-04 DIAGNOSIS — I130 Hypertensive heart and chronic kidney disease with heart failure and stage 1 through stage 4 chronic kidney disease, or unspecified chronic kidney disease: Secondary | ICD-10-CM | POA: Diagnosis not present

## 2018-02-04 DIAGNOSIS — E1122 Type 2 diabetes mellitus with diabetic chronic kidney disease: Secondary | ICD-10-CM | POA: Diagnosis not present

## 2018-02-04 DIAGNOSIS — N183 Chronic kidney disease, stage 3 (moderate): Secondary | ICD-10-CM | POA: Diagnosis not present

## 2018-02-04 DIAGNOSIS — I5032 Chronic diastolic (congestive) heart failure: Secondary | ICD-10-CM | POA: Diagnosis not present

## 2018-02-04 DIAGNOSIS — I25700 Atherosclerosis of coronary artery bypass graft(s), unspecified, with unstable angina pectoris: Secondary | ICD-10-CM | POA: Diagnosis not present

## 2018-02-04 DIAGNOSIS — E1121 Type 2 diabetes mellitus with diabetic nephropathy: Secondary | ICD-10-CM | POA: Diagnosis not present

## 2018-02-06 DIAGNOSIS — I25700 Atherosclerosis of coronary artery bypass graft(s), unspecified, with unstable angina pectoris: Secondary | ICD-10-CM | POA: Diagnosis not present

## 2018-02-06 DIAGNOSIS — E1122 Type 2 diabetes mellitus with diabetic chronic kidney disease: Secondary | ICD-10-CM | POA: Diagnosis not present

## 2018-02-06 DIAGNOSIS — E1121 Type 2 diabetes mellitus with diabetic nephropathy: Secondary | ICD-10-CM | POA: Diagnosis not present

## 2018-02-06 DIAGNOSIS — N183 Chronic kidney disease, stage 3 (moderate): Secondary | ICD-10-CM | POA: Diagnosis not present

## 2018-02-06 DIAGNOSIS — I130 Hypertensive heart and chronic kidney disease with heart failure and stage 1 through stage 4 chronic kidney disease, or unspecified chronic kidney disease: Secondary | ICD-10-CM | POA: Diagnosis not present

## 2018-02-06 DIAGNOSIS — I5032 Chronic diastolic (congestive) heart failure: Secondary | ICD-10-CM | POA: Diagnosis not present

## 2018-02-07 ENCOUNTER — Telehealth: Payer: Self-pay | Admitting: *Deleted

## 2018-02-07 DIAGNOSIS — E1121 Type 2 diabetes mellitus with diabetic nephropathy: Secondary | ICD-10-CM | POA: Diagnosis not present

## 2018-02-07 DIAGNOSIS — N183 Chronic kidney disease, stage 3 (moderate): Secondary | ICD-10-CM | POA: Diagnosis not present

## 2018-02-07 DIAGNOSIS — I130 Hypertensive heart and chronic kidney disease with heart failure and stage 1 through stage 4 chronic kidney disease, or unspecified chronic kidney disease: Secondary | ICD-10-CM | POA: Diagnosis not present

## 2018-02-07 DIAGNOSIS — I25700 Atherosclerosis of coronary artery bypass graft(s), unspecified, with unstable angina pectoris: Secondary | ICD-10-CM | POA: Diagnosis not present

## 2018-02-07 DIAGNOSIS — I5032 Chronic diastolic (congestive) heart failure: Secondary | ICD-10-CM | POA: Diagnosis not present

## 2018-02-07 DIAGNOSIS — E1122 Type 2 diabetes mellitus with diabetic chronic kidney disease: Secondary | ICD-10-CM | POA: Diagnosis not present

## 2018-02-07 NOTE — Telephone Encounter (Signed)
Patient's daughter [name]) called stating that she just faxed over some FMLA paperwork that needs to be completed for her. Dianne stated that her mom is at [facility] and in [facility]. [name] stated that her mom is declining and she is having to be available more to help with her care. Dianne stated that she understands that it takes a while to get this done, but would appreciate it being completed as soon as it can be done.

## 2018-02-08 DIAGNOSIS — Z0279 Encounter for issue of other medical certificate: Secondary | ICD-10-CM

## 2018-02-08 DIAGNOSIS — E1122 Type 2 diabetes mellitus with diabetic chronic kidney disease: Secondary | ICD-10-CM | POA: Diagnosis not present

## 2018-02-08 DIAGNOSIS — N183 Chronic kidney disease, stage 3 (moderate): Secondary | ICD-10-CM | POA: Diagnosis not present

## 2018-02-08 DIAGNOSIS — I130 Hypertensive heart and chronic kidney disease with heart failure and stage 1 through stage 4 chronic kidney disease, or unspecified chronic kidney disease: Secondary | ICD-10-CM | POA: Diagnosis not present

## 2018-02-08 DIAGNOSIS — I5032 Chronic diastolic (congestive) heart failure: Secondary | ICD-10-CM | POA: Diagnosis not present

## 2018-02-08 DIAGNOSIS — E1121 Type 2 diabetes mellitus with diabetic nephropathy: Secondary | ICD-10-CM | POA: Diagnosis not present

## 2018-02-08 DIAGNOSIS — I25700 Atherosclerosis of coronary artery bypass graft(s), unspecified, with unstable angina pectoris: Secondary | ICD-10-CM | POA: Diagnosis not present

## 2018-02-08 NOTE — Telephone Encounter (Signed)
Paperwork in d g in box For review and signature

## 2018-02-08 NOTE — Telephone Encounter (Signed)
Signed and my out box. Thank you.

## 2018-02-08 NOTE — Telephone Encounter (Signed)
plz call stacy -  UCx returned growing pan sensitive E coli.  I know family has decided full comfort only, no treatment. Let us know if they change mind and want abx for UTI (would do keflex 500mg  bid x 7 days)

## 2018-02-09 DIAGNOSIS — I130 Hypertensive heart and chronic kidney disease with heart failure and stage 1 through stage 4 chronic kidney disease, or unspecified chronic kidney disease: Secondary | ICD-10-CM | POA: Diagnosis not present

## 2018-02-09 DIAGNOSIS — I25700 Atherosclerosis of coronary artery bypass graft(s), unspecified, with unstable angina pectoris: Secondary | ICD-10-CM | POA: Diagnosis not present

## 2018-02-09 DIAGNOSIS — I5032 Chronic diastolic (congestive) heart failure: Secondary | ICD-10-CM | POA: Diagnosis not present

## 2018-02-09 DIAGNOSIS — E1122 Type 2 diabetes mellitus with diabetic chronic kidney disease: Secondary | ICD-10-CM | POA: Diagnosis not present

## 2018-02-09 DIAGNOSIS — E1121 Type 2 diabetes mellitus with diabetic nephropathy: Secondary | ICD-10-CM | POA: Diagnosis not present

## 2018-02-09 DIAGNOSIS — N183 Chronic kidney disease, stage 3 (moderate): Secondary | ICD-10-CM | POA: Diagnosis not present

## 2018-02-09 NOTE — Telephone Encounter (Signed)
Spoke with [facility] relaying Dr. Synthia Innocent message.  She verbalizes understanding and states she says the family is pretty adamant about tx.  But she will relay the info and let Dr. Darnell Level know what they say. Fyi to Dr. Darnell Level.

## 2018-02-10 DIAGNOSIS — I25700 Atherosclerosis of coronary artery bypass graft(s), unspecified, with unstable angina pectoris: Secondary | ICD-10-CM | POA: Diagnosis not present

## 2018-02-10 DIAGNOSIS — E1122 Type 2 diabetes mellitus with diabetic chronic kidney disease: Secondary | ICD-10-CM | POA: Diagnosis not present

## 2018-02-10 DIAGNOSIS — I130 Hypertensive heart and chronic kidney disease with heart failure and stage 1 through stage 4 chronic kidney disease, or unspecified chronic kidney disease: Secondary | ICD-10-CM | POA: Diagnosis not present

## 2018-02-10 DIAGNOSIS — E1121 Type 2 diabetes mellitus with diabetic nephropathy: Secondary | ICD-10-CM | POA: Diagnosis not present

## 2018-02-10 DIAGNOSIS — N183 Chronic kidney disease, stage 3 (moderate): Secondary | ICD-10-CM | POA: Diagnosis not present

## 2018-02-10 DIAGNOSIS — I5032 Chronic diastolic (congestive) heart failure: Secondary | ICD-10-CM | POA: Diagnosis not present

## 2018-02-10 NOTE — Telephone Encounter (Signed)
Paperwork email per daughters request

## 2018-02-13 DIAGNOSIS — I130 Hypertensive heart and chronic kidney disease with heart failure and stage 1 through stage 4 chronic kidney disease, or unspecified chronic kidney disease: Secondary | ICD-10-CM | POA: Diagnosis not present

## 2018-02-13 DIAGNOSIS — I25700 Atherosclerosis of coronary artery bypass graft(s), unspecified, with unstable angina pectoris: Secondary | ICD-10-CM | POA: Diagnosis not present

## 2018-02-13 DIAGNOSIS — I5032 Chronic diastolic (congestive) heart failure: Secondary | ICD-10-CM | POA: Diagnosis not present

## 2018-02-13 DIAGNOSIS — N183 Chronic kidney disease, stage 3 (moderate): Secondary | ICD-10-CM | POA: Diagnosis not present

## 2018-02-13 DIAGNOSIS — E1121 Type 2 diabetes mellitus with diabetic nephropathy: Secondary | ICD-10-CM | POA: Diagnosis not present

## 2018-02-13 DIAGNOSIS — E1122 Type 2 diabetes mellitus with diabetic chronic kidney disease: Secondary | ICD-10-CM | POA: Diagnosis not present

## 2018-02-13 NOTE — Telephone Encounter (Signed)
Copy for pt Copy for scan Copy for billing

## 2018-02-14 DIAGNOSIS — E1121 Type 2 diabetes mellitus with diabetic nephropathy: Secondary | ICD-10-CM | POA: Diagnosis not present

## 2018-02-14 DIAGNOSIS — I5032 Chronic diastolic (congestive) heart failure: Secondary | ICD-10-CM | POA: Diagnosis not present

## 2018-02-14 DIAGNOSIS — I130 Hypertensive heart and chronic kidney disease with heart failure and stage 1 through stage 4 chronic kidney disease, or unspecified chronic kidney disease: Secondary | ICD-10-CM | POA: Diagnosis not present

## 2018-02-14 DIAGNOSIS — E1122 Type 2 diabetes mellitus with diabetic chronic kidney disease: Secondary | ICD-10-CM | POA: Diagnosis not present

## 2018-02-14 DIAGNOSIS — I25700 Atherosclerosis of coronary artery bypass graft(s), unspecified, with unstable angina pectoris: Secondary | ICD-10-CM | POA: Diagnosis not present

## 2018-02-14 DIAGNOSIS — N183 Chronic kidney disease, stage 3 (moderate): Secondary | ICD-10-CM | POA: Diagnosis not present

## 2018-02-15 DIAGNOSIS — I25700 Atherosclerosis of coronary artery bypass graft(s), unspecified, with unstable angina pectoris: Secondary | ICD-10-CM | POA: Diagnosis not present

## 2018-02-15 DIAGNOSIS — I130 Hypertensive heart and chronic kidney disease with heart failure and stage 1 through stage 4 chronic kidney disease, or unspecified chronic kidney disease: Secondary | ICD-10-CM | POA: Diagnosis not present

## 2018-02-15 DIAGNOSIS — E1122 Type 2 diabetes mellitus with diabetic chronic kidney disease: Secondary | ICD-10-CM | POA: Diagnosis not present

## 2018-02-15 DIAGNOSIS — I5032 Chronic diastolic (congestive) heart failure: Secondary | ICD-10-CM | POA: Diagnosis not present

## 2018-02-15 DIAGNOSIS — N183 Chronic kidney disease, stage 3 (moderate): Secondary | ICD-10-CM | POA: Diagnosis not present

## 2018-02-15 DIAGNOSIS — E1121 Type 2 diabetes mellitus with diabetic nephropathy: Secondary | ICD-10-CM | POA: Diagnosis not present

## 2018-02-16 DIAGNOSIS — E1122 Type 2 diabetes mellitus with diabetic chronic kidney disease: Secondary | ICD-10-CM | POA: Diagnosis not present

## 2018-02-16 DIAGNOSIS — N183 Chronic kidney disease, stage 3 (moderate): Secondary | ICD-10-CM | POA: Diagnosis not present

## 2018-02-16 DIAGNOSIS — I5032 Chronic diastolic (congestive) heart failure: Secondary | ICD-10-CM | POA: Diagnosis not present

## 2018-02-16 DIAGNOSIS — I25700 Atherosclerosis of coronary artery bypass graft(s), unspecified, with unstable angina pectoris: Secondary | ICD-10-CM | POA: Diagnosis not present

## 2018-02-16 DIAGNOSIS — I130 Hypertensive heart and chronic kidney disease with heart failure and stage 1 through stage 4 chronic kidney disease, or unspecified chronic kidney disease: Secondary | ICD-10-CM | POA: Diagnosis not present

## 2018-02-16 DIAGNOSIS — E1121 Type 2 diabetes mellitus with diabetic nephropathy: Secondary | ICD-10-CM | POA: Diagnosis not present

## 2018-02-17 DIAGNOSIS — E1121 Type 2 diabetes mellitus with diabetic nephropathy: Secondary | ICD-10-CM | POA: Diagnosis not present

## 2018-02-17 DIAGNOSIS — N183 Chronic kidney disease, stage 3 (moderate): Secondary | ICD-10-CM | POA: Diagnosis not present

## 2018-02-17 DIAGNOSIS — I130 Hypertensive heart and chronic kidney disease with heart failure and stage 1 through stage 4 chronic kidney disease, or unspecified chronic kidney disease: Secondary | ICD-10-CM | POA: Diagnosis not present

## 2018-02-17 DIAGNOSIS — E1122 Type 2 diabetes mellitus with diabetic chronic kidney disease: Secondary | ICD-10-CM | POA: Diagnosis not present

## 2018-02-17 DIAGNOSIS — I25700 Atherosclerosis of coronary artery bypass graft(s), unspecified, with unstable angina pectoris: Secondary | ICD-10-CM | POA: Diagnosis not present

## 2018-02-17 DIAGNOSIS — I5032 Chronic diastolic (congestive) heart failure: Secondary | ICD-10-CM | POA: Diagnosis not present

## 2018-02-20 DIAGNOSIS — I5032 Chronic diastolic (congestive) heart failure: Secondary | ICD-10-CM | POA: Diagnosis not present

## 2018-02-20 DIAGNOSIS — E1122 Type 2 diabetes mellitus with diabetic chronic kidney disease: Secondary | ICD-10-CM | POA: Diagnosis not present

## 2018-02-20 DIAGNOSIS — I130 Hypertensive heart and chronic kidney disease with heart failure and stage 1 through stage 4 chronic kidney disease, or unspecified chronic kidney disease: Secondary | ICD-10-CM | POA: Diagnosis not present

## 2018-02-20 DIAGNOSIS — E1121 Type 2 diabetes mellitus with diabetic nephropathy: Secondary | ICD-10-CM | POA: Diagnosis not present

## 2018-02-20 DIAGNOSIS — N183 Chronic kidney disease, stage 3 (moderate): Secondary | ICD-10-CM | POA: Diagnosis not present

## 2018-02-20 DIAGNOSIS — I25700 Atherosclerosis of coronary artery bypass graft(s), unspecified, with unstable angina pectoris: Secondary | ICD-10-CM | POA: Diagnosis not present

## 2018-02-21 DIAGNOSIS — I25700 Atherosclerosis of coronary artery bypass graft(s), unspecified, with unstable angina pectoris: Secondary | ICD-10-CM | POA: Diagnosis not present

## 2018-02-21 DIAGNOSIS — E1122 Type 2 diabetes mellitus with diabetic chronic kidney disease: Secondary | ICD-10-CM | POA: Diagnosis not present

## 2018-02-21 DIAGNOSIS — E1121 Type 2 diabetes mellitus with diabetic nephropathy: Secondary | ICD-10-CM | POA: Diagnosis not present

## 2018-02-21 DIAGNOSIS — I130 Hypertensive heart and chronic kidney disease with heart failure and stage 1 through stage 4 chronic kidney disease, or unspecified chronic kidney disease: Secondary | ICD-10-CM | POA: Diagnosis not present

## 2018-02-21 DIAGNOSIS — N183 Chronic kidney disease, stage 3 (moderate): Secondary | ICD-10-CM | POA: Diagnosis not present

## 2018-02-21 DIAGNOSIS — I5032 Chronic diastolic (congestive) heart failure: Secondary | ICD-10-CM | POA: Diagnosis not present

## 2018-02-22 DIAGNOSIS — I130 Hypertensive heart and chronic kidney disease with heart failure and stage 1 through stage 4 chronic kidney disease, or unspecified chronic kidney disease: Secondary | ICD-10-CM | POA: Diagnosis not present

## 2018-02-22 DIAGNOSIS — E1122 Type 2 diabetes mellitus with diabetic chronic kidney disease: Secondary | ICD-10-CM | POA: Diagnosis not present

## 2018-02-22 DIAGNOSIS — N183 Chronic kidney disease, stage 3 (moderate): Secondary | ICD-10-CM | POA: Diagnosis not present

## 2018-02-22 DIAGNOSIS — E1121 Type 2 diabetes mellitus with diabetic nephropathy: Secondary | ICD-10-CM | POA: Diagnosis not present

## 2018-02-22 DIAGNOSIS — I25700 Atherosclerosis of coronary artery bypass graft(s), unspecified, with unstable angina pectoris: Secondary | ICD-10-CM | POA: Diagnosis not present

## 2018-02-22 DIAGNOSIS — I5032 Chronic diastolic (congestive) heart failure: Secondary | ICD-10-CM | POA: Diagnosis not present

## 2018-02-23 DIAGNOSIS — N183 Chronic kidney disease, stage 3 (moderate): Secondary | ICD-10-CM | POA: Diagnosis not present

## 2018-02-23 DIAGNOSIS — I25700 Atherosclerosis of coronary artery bypass graft(s), unspecified, with unstable angina pectoris: Secondary | ICD-10-CM | POA: Diagnosis not present

## 2018-02-23 DIAGNOSIS — E1121 Type 2 diabetes mellitus with diabetic nephropathy: Secondary | ICD-10-CM | POA: Diagnosis not present

## 2018-02-23 DIAGNOSIS — E1122 Type 2 diabetes mellitus with diabetic chronic kidney disease: Secondary | ICD-10-CM | POA: Diagnosis not present

## 2018-02-23 DIAGNOSIS — I130 Hypertensive heart and chronic kidney disease with heart failure and stage 1 through stage 4 chronic kidney disease, or unspecified chronic kidney disease: Secondary | ICD-10-CM | POA: Diagnosis not present

## 2018-02-23 DIAGNOSIS — I5032 Chronic diastolic (congestive) heart failure: Secondary | ICD-10-CM | POA: Diagnosis not present

## 2018-02-24 DIAGNOSIS — I25700 Atherosclerosis of coronary artery bypass graft(s), unspecified, with unstable angina pectoris: Secondary | ICD-10-CM | POA: Diagnosis not present

## 2018-02-24 DIAGNOSIS — N183 Chronic kidney disease, stage 3 (moderate): Secondary | ICD-10-CM | POA: Diagnosis not present

## 2018-02-24 DIAGNOSIS — E1121 Type 2 diabetes mellitus with diabetic nephropathy: Secondary | ICD-10-CM | POA: Diagnosis not present

## 2018-02-24 DIAGNOSIS — E1122 Type 2 diabetes mellitus with diabetic chronic kidney disease: Secondary | ICD-10-CM | POA: Diagnosis not present

## 2018-02-24 DIAGNOSIS — I130 Hypertensive heart and chronic kidney disease with heart failure and stage 1 through stage 4 chronic kidney disease, or unspecified chronic kidney disease: Secondary | ICD-10-CM | POA: Diagnosis not present

## 2018-02-24 DIAGNOSIS — I5032 Chronic diastolic (congestive) heart failure: Secondary | ICD-10-CM | POA: Diagnosis not present

## 2018-02-27 DIAGNOSIS — N183 Chronic kidney disease, stage 3 (moderate): Secondary | ICD-10-CM | POA: Diagnosis not present

## 2018-02-27 DIAGNOSIS — I25700 Atherosclerosis of coronary artery bypass graft(s), unspecified, with unstable angina pectoris: Secondary | ICD-10-CM | POA: Diagnosis not present

## 2018-02-27 DIAGNOSIS — E1122 Type 2 diabetes mellitus with diabetic chronic kidney disease: Secondary | ICD-10-CM | POA: Diagnosis not present

## 2018-02-27 DIAGNOSIS — I130 Hypertensive heart and chronic kidney disease with heart failure and stage 1 through stage 4 chronic kidney disease, or unspecified chronic kidney disease: Secondary | ICD-10-CM | POA: Diagnosis not present

## 2018-02-27 DIAGNOSIS — E1121 Type 2 diabetes mellitus with diabetic nephropathy: Secondary | ICD-10-CM | POA: Diagnosis not present

## 2018-02-27 DIAGNOSIS — I5032 Chronic diastolic (congestive) heart failure: Secondary | ICD-10-CM | POA: Diagnosis not present

## 2018-02-28 DIAGNOSIS — I5032 Chronic diastolic (congestive) heart failure: Secondary | ICD-10-CM | POA: Diagnosis not present

## 2018-02-28 DIAGNOSIS — N183 Chronic kidney disease, stage 3 (moderate): Secondary | ICD-10-CM | POA: Diagnosis not present

## 2018-02-28 DIAGNOSIS — E1122 Type 2 diabetes mellitus with diabetic chronic kidney disease: Secondary | ICD-10-CM | POA: Diagnosis not present

## 2018-02-28 DIAGNOSIS — I130 Hypertensive heart and chronic kidney disease with heart failure and stage 1 through stage 4 chronic kidney disease, or unspecified chronic kidney disease: Secondary | ICD-10-CM | POA: Diagnosis not present

## 2018-02-28 DIAGNOSIS — I25700 Atherosclerosis of coronary artery bypass graft(s), unspecified, with unstable angina pectoris: Secondary | ICD-10-CM | POA: Diagnosis not present

## 2018-02-28 DIAGNOSIS — E1121 Type 2 diabetes mellitus with diabetic nephropathy: Secondary | ICD-10-CM | POA: Diagnosis not present

## 2018-03-01 DIAGNOSIS — I5032 Chronic diastolic (congestive) heart failure: Secondary | ICD-10-CM | POA: Diagnosis not present

## 2018-03-01 DIAGNOSIS — E1122 Type 2 diabetes mellitus with diabetic chronic kidney disease: Secondary | ICD-10-CM | POA: Diagnosis not present

## 2018-03-01 DIAGNOSIS — E1121 Type 2 diabetes mellitus with diabetic nephropathy: Secondary | ICD-10-CM | POA: Diagnosis not present

## 2018-03-01 DIAGNOSIS — N183 Chronic kidney disease, stage 3 (moderate): Secondary | ICD-10-CM | POA: Diagnosis not present

## 2018-03-01 DIAGNOSIS — I25700 Atherosclerosis of coronary artery bypass graft(s), unspecified, with unstable angina pectoris: Secondary | ICD-10-CM | POA: Diagnosis not present

## 2018-03-01 DIAGNOSIS — I130 Hypertensive heart and chronic kidney disease with heart failure and stage 1 through stage 4 chronic kidney disease, or unspecified chronic kidney disease: Secondary | ICD-10-CM | POA: Diagnosis not present

## 2018-03-02 DIAGNOSIS — I130 Hypertensive heart and chronic kidney disease with heart failure and stage 1 through stage 4 chronic kidney disease, or unspecified chronic kidney disease: Secondary | ICD-10-CM | POA: Diagnosis not present

## 2018-03-02 DIAGNOSIS — I25700 Atherosclerosis of coronary artery bypass graft(s), unspecified, with unstable angina pectoris: Secondary | ICD-10-CM | POA: Diagnosis not present

## 2018-03-02 DIAGNOSIS — N183 Chronic kidney disease, stage 3 (moderate): Secondary | ICD-10-CM | POA: Diagnosis not present

## 2018-03-02 DIAGNOSIS — I5032 Chronic diastolic (congestive) heart failure: Secondary | ICD-10-CM | POA: Diagnosis not present

## 2018-03-02 DIAGNOSIS — E1122 Type 2 diabetes mellitus with diabetic chronic kidney disease: Secondary | ICD-10-CM | POA: Diagnosis not present

## 2018-03-02 DIAGNOSIS — E1121 Type 2 diabetes mellitus with diabetic nephropathy: Secondary | ICD-10-CM | POA: Diagnosis not present

## 2018-03-03 ENCOUNTER — Other Ambulatory Visit: Payer: Self-pay | Admitting: Family Medicine

## 2018-03-03 DIAGNOSIS — E1121 Type 2 diabetes mellitus with diabetic nephropathy: Secondary | ICD-10-CM | POA: Diagnosis not present

## 2018-03-03 DIAGNOSIS — E1122 Type 2 diabetes mellitus with diabetic chronic kidney disease: Secondary | ICD-10-CM | POA: Diagnosis not present

## 2018-03-03 DIAGNOSIS — N183 Chronic kidney disease, stage 3 (moderate): Secondary | ICD-10-CM | POA: Diagnosis not present

## 2018-03-03 DIAGNOSIS — I130 Hypertensive heart and chronic kidney disease with heart failure and stage 1 through stage 4 chronic kidney disease, or unspecified chronic kidney disease: Secondary | ICD-10-CM | POA: Diagnosis not present

## 2018-03-03 DIAGNOSIS — I25700 Atherosclerosis of coronary artery bypass graft(s), unspecified, with unstable angina pectoris: Secondary | ICD-10-CM | POA: Diagnosis not present

## 2018-03-03 DIAGNOSIS — I5032 Chronic diastolic (congestive) heart failure: Secondary | ICD-10-CM | POA: Diagnosis not present

## 2018-03-04 DIAGNOSIS — J9611 Chronic respiratory failure with hypoxia: Secondary | ICD-10-CM | POA: Diagnosis not present

## 2018-03-04 DIAGNOSIS — Z7984 Long term (current) use of oral hypoglycemic drugs: Secondary | ICD-10-CM | POA: Diagnosis not present

## 2018-03-04 DIAGNOSIS — I130 Hypertensive heart and chronic kidney disease with heart failure and stage 1 through stage 4 chronic kidney disease, or unspecified chronic kidney disease: Secondary | ICD-10-CM | POA: Diagnosis not present

## 2018-03-04 DIAGNOSIS — Z6820 Body mass index (BMI) 20.0-20.9, adult: Secondary | ICD-10-CM | POA: Diagnosis not present

## 2018-03-04 DIAGNOSIS — I25700 Atherosclerosis of coronary artery bypass graft(s), unspecified, with unstable angina pectoris: Secondary | ICD-10-CM | POA: Diagnosis not present

## 2018-03-04 DIAGNOSIS — Z741 Need for assistance with personal care: Secondary | ICD-10-CM | POA: Diagnosis not present

## 2018-03-04 DIAGNOSIS — E785 Hyperlipidemia, unspecified: Secondary | ICD-10-CM | POA: Diagnosis not present

## 2018-03-04 DIAGNOSIS — E1122 Type 2 diabetes mellitus with diabetic chronic kidney disease: Secondary | ICD-10-CM | POA: Diagnosis not present

## 2018-03-04 DIAGNOSIS — Z7902 Long term (current) use of antithrombotics/antiplatelets: Secondary | ICD-10-CM | POA: Diagnosis not present

## 2018-03-04 DIAGNOSIS — Z947 Corneal transplant status: Secondary | ICD-10-CM | POA: Diagnosis not present

## 2018-03-04 DIAGNOSIS — I5032 Chronic diastolic (congestive) heart failure: Secondary | ICD-10-CM | POA: Diagnosis not present

## 2018-03-04 DIAGNOSIS — E1121 Type 2 diabetes mellitus with diabetic nephropathy: Secondary | ICD-10-CM | POA: Diagnosis not present

## 2018-03-04 DIAGNOSIS — Z9981 Dependence on supplemental oxygen: Secondary | ICD-10-CM | POA: Diagnosis not present

## 2018-03-04 DIAGNOSIS — Z7901 Long term (current) use of anticoagulants: Secondary | ICD-10-CM | POA: Diagnosis not present

## 2018-03-04 DIAGNOSIS — B379 Candidiasis, unspecified: Secondary | ICD-10-CM | POA: Diagnosis not present

## 2018-03-04 DIAGNOSIS — N183 Chronic kidney disease, stage 3 (moderate): Secondary | ICD-10-CM | POA: Diagnosis not present

## 2018-03-04 DIAGNOSIS — R32 Unspecified urinary incontinence: Secondary | ICD-10-CM | POA: Diagnosis not present

## 2018-03-04 DIAGNOSIS — Z9181 History of falling: Secondary | ICD-10-CM | POA: Diagnosis not present

## 2018-03-06 DIAGNOSIS — N183 Chronic kidney disease, stage 3 (moderate): Secondary | ICD-10-CM | POA: Diagnosis not present

## 2018-03-06 DIAGNOSIS — E1121 Type 2 diabetes mellitus with diabetic nephropathy: Secondary | ICD-10-CM | POA: Diagnosis not present

## 2018-03-06 DIAGNOSIS — I5032 Chronic diastolic (congestive) heart failure: Secondary | ICD-10-CM | POA: Diagnosis not present

## 2018-03-06 DIAGNOSIS — I130 Hypertensive heart and chronic kidney disease with heart failure and stage 1 through stage 4 chronic kidney disease, or unspecified chronic kidney disease: Secondary | ICD-10-CM | POA: Diagnosis not present

## 2018-03-06 DIAGNOSIS — J9611 Chronic respiratory failure with hypoxia: Secondary | ICD-10-CM | POA: Diagnosis not present

## 2018-03-06 DIAGNOSIS — E1122 Type 2 diabetes mellitus with diabetic chronic kidney disease: Secondary | ICD-10-CM | POA: Diagnosis not present

## 2018-03-07 ENCOUNTER — Telehealth: Payer: Self-pay | Admitting: Family Medicine

## 2018-03-07 DIAGNOSIS — I5032 Chronic diastolic (congestive) heart failure: Secondary | ICD-10-CM | POA: Diagnosis not present

## 2018-03-07 DIAGNOSIS — E1121 Type 2 diabetes mellitus with diabetic nephropathy: Secondary | ICD-10-CM | POA: Diagnosis not present

## 2018-03-07 DIAGNOSIS — I130 Hypertensive heart and chronic kidney disease with heart failure and stage 1 through stage 4 chronic kidney disease, or unspecified chronic kidney disease: Secondary | ICD-10-CM | POA: Diagnosis not present

## 2018-03-07 DIAGNOSIS — E1122 Type 2 diabetes mellitus with diabetic chronic kidney disease: Secondary | ICD-10-CM | POA: Diagnosis not present

## 2018-03-07 DIAGNOSIS — J9611 Chronic respiratory failure with hypoxia: Secondary | ICD-10-CM | POA: Diagnosis not present

## 2018-03-07 DIAGNOSIS — N183 Chronic kidney disease, stage 3 (moderate): Secondary | ICD-10-CM | POA: Diagnosis not present

## 2018-03-07 NOTE — Telephone Encounter (Signed)
Left message asking pt to call office please r/s 2/20 appointment with dr g

## 2018-03-08 DIAGNOSIS — E1122 Type 2 diabetes mellitus with diabetic chronic kidney disease: Secondary | ICD-10-CM | POA: Diagnosis not present

## 2018-03-08 DIAGNOSIS — I130 Hypertensive heart and chronic kidney disease with heart failure and stage 1 through stage 4 chronic kidney disease, or unspecified chronic kidney disease: Secondary | ICD-10-CM | POA: Diagnosis not present

## 2018-03-08 DIAGNOSIS — E1121 Type 2 diabetes mellitus with diabetic nephropathy: Secondary | ICD-10-CM | POA: Diagnosis not present

## 2018-03-08 DIAGNOSIS — J9611 Chronic respiratory failure with hypoxia: Secondary | ICD-10-CM | POA: Diagnosis not present

## 2018-03-08 DIAGNOSIS — I5032 Chronic diastolic (congestive) heart failure: Secondary | ICD-10-CM | POA: Diagnosis not present

## 2018-03-08 DIAGNOSIS — N183 Chronic kidney disease, stage 3 (moderate): Secondary | ICD-10-CM | POA: Diagnosis not present

## 2018-03-09 DIAGNOSIS — N183 Chronic kidney disease, stage 3 (moderate): Secondary | ICD-10-CM | POA: Diagnosis not present

## 2018-03-09 DIAGNOSIS — J9611 Chronic respiratory failure with hypoxia: Secondary | ICD-10-CM | POA: Diagnosis not present

## 2018-03-09 DIAGNOSIS — E1122 Type 2 diabetes mellitus with diabetic chronic kidney disease: Secondary | ICD-10-CM | POA: Diagnosis not present

## 2018-03-09 DIAGNOSIS — I5032 Chronic diastolic (congestive) heart failure: Secondary | ICD-10-CM | POA: Diagnosis not present

## 2018-03-09 DIAGNOSIS — E1121 Type 2 diabetes mellitus with diabetic nephropathy: Secondary | ICD-10-CM | POA: Diagnosis not present

## 2018-03-09 DIAGNOSIS — I130 Hypertensive heart and chronic kidney disease with heart failure and stage 1 through stage 4 chronic kidney disease, or unspecified chronic kidney disease: Secondary | ICD-10-CM | POA: Diagnosis not present

## 2018-03-10 DIAGNOSIS — J9611 Chronic respiratory failure with hypoxia: Secondary | ICD-10-CM | POA: Diagnosis not present

## 2018-03-10 DIAGNOSIS — I5032 Chronic diastolic (congestive) heart failure: Secondary | ICD-10-CM | POA: Diagnosis not present

## 2018-03-10 DIAGNOSIS — E1121 Type 2 diabetes mellitus with diabetic nephropathy: Secondary | ICD-10-CM | POA: Diagnosis not present

## 2018-03-10 DIAGNOSIS — I130 Hypertensive heart and chronic kidney disease with heart failure and stage 1 through stage 4 chronic kidney disease, or unspecified chronic kidney disease: Secondary | ICD-10-CM | POA: Diagnosis not present

## 2018-03-10 DIAGNOSIS — E1122 Type 2 diabetes mellitus with diabetic chronic kidney disease: Secondary | ICD-10-CM | POA: Diagnosis not present

## 2018-03-10 DIAGNOSIS — N183 Chronic kidney disease, stage 3 (moderate): Secondary | ICD-10-CM | POA: Diagnosis not present

## 2018-03-13 DIAGNOSIS — N183 Chronic kidney disease, stage 3 (moderate): Secondary | ICD-10-CM | POA: Diagnosis not present

## 2018-03-13 DIAGNOSIS — I5032 Chronic diastolic (congestive) heart failure: Secondary | ICD-10-CM | POA: Diagnosis not present

## 2018-03-13 DIAGNOSIS — E1122 Type 2 diabetes mellitus with diabetic chronic kidney disease: Secondary | ICD-10-CM | POA: Diagnosis not present

## 2018-03-13 DIAGNOSIS — I130 Hypertensive heart and chronic kidney disease with heart failure and stage 1 through stage 4 chronic kidney disease, or unspecified chronic kidney disease: Secondary | ICD-10-CM | POA: Diagnosis not present

## 2018-03-13 DIAGNOSIS — J9611 Chronic respiratory failure with hypoxia: Secondary | ICD-10-CM | POA: Diagnosis not present

## 2018-03-13 DIAGNOSIS — E1121 Type 2 diabetes mellitus with diabetic nephropathy: Secondary | ICD-10-CM | POA: Diagnosis not present

## 2018-03-14 DIAGNOSIS — E1122 Type 2 diabetes mellitus with diabetic chronic kidney disease: Secondary | ICD-10-CM | POA: Diagnosis not present

## 2018-03-14 DIAGNOSIS — N183 Chronic kidney disease, stage 3 (moderate): Secondary | ICD-10-CM | POA: Diagnosis not present

## 2018-03-14 DIAGNOSIS — J9611 Chronic respiratory failure with hypoxia: Secondary | ICD-10-CM | POA: Diagnosis not present

## 2018-03-14 DIAGNOSIS — I130 Hypertensive heart and chronic kidney disease with heart failure and stage 1 through stage 4 chronic kidney disease, or unspecified chronic kidney disease: Secondary | ICD-10-CM | POA: Diagnosis not present

## 2018-03-14 DIAGNOSIS — I5032 Chronic diastolic (congestive) heart failure: Secondary | ICD-10-CM | POA: Diagnosis not present

## 2018-03-14 DIAGNOSIS — E1121 Type 2 diabetes mellitus with diabetic nephropathy: Secondary | ICD-10-CM | POA: Diagnosis not present

## 2018-03-15 DIAGNOSIS — I5032 Chronic diastolic (congestive) heart failure: Secondary | ICD-10-CM | POA: Diagnosis not present

## 2018-03-15 DIAGNOSIS — J9611 Chronic respiratory failure with hypoxia: Secondary | ICD-10-CM | POA: Diagnosis not present

## 2018-03-15 DIAGNOSIS — I130 Hypertensive heart and chronic kidney disease with heart failure and stage 1 through stage 4 chronic kidney disease, or unspecified chronic kidney disease: Secondary | ICD-10-CM | POA: Diagnosis not present

## 2018-03-15 DIAGNOSIS — E1122 Type 2 diabetes mellitus with diabetic chronic kidney disease: Secondary | ICD-10-CM | POA: Diagnosis not present

## 2018-03-15 DIAGNOSIS — E1121 Type 2 diabetes mellitus with diabetic nephropathy: Secondary | ICD-10-CM | POA: Diagnosis not present

## 2018-03-15 DIAGNOSIS — N183 Chronic kidney disease, stage 3 (moderate): Secondary | ICD-10-CM | POA: Diagnosis not present

## 2018-03-16 DIAGNOSIS — I5032 Chronic diastolic (congestive) heart failure: Secondary | ICD-10-CM | POA: Diagnosis not present

## 2018-03-16 DIAGNOSIS — E1122 Type 2 diabetes mellitus with diabetic chronic kidney disease: Secondary | ICD-10-CM | POA: Diagnosis not present

## 2018-03-16 DIAGNOSIS — E1121 Type 2 diabetes mellitus with diabetic nephropathy: Secondary | ICD-10-CM | POA: Diagnosis not present

## 2018-03-16 DIAGNOSIS — J9611 Chronic respiratory failure with hypoxia: Secondary | ICD-10-CM | POA: Diagnosis not present

## 2018-03-16 DIAGNOSIS — I130 Hypertensive heart and chronic kidney disease with heart failure and stage 1 through stage 4 chronic kidney disease, or unspecified chronic kidney disease: Secondary | ICD-10-CM | POA: Diagnosis not present

## 2018-03-16 DIAGNOSIS — N183 Chronic kidney disease, stage 3 (moderate): Secondary | ICD-10-CM | POA: Diagnosis not present

## 2018-03-17 DIAGNOSIS — E1122 Type 2 diabetes mellitus with diabetic chronic kidney disease: Secondary | ICD-10-CM | POA: Diagnosis not present

## 2018-03-17 DIAGNOSIS — N183 Chronic kidney disease, stage 3 (moderate): Secondary | ICD-10-CM | POA: Diagnosis not present

## 2018-03-17 DIAGNOSIS — I5032 Chronic diastolic (congestive) heart failure: Secondary | ICD-10-CM | POA: Diagnosis not present

## 2018-03-17 DIAGNOSIS — J9611 Chronic respiratory failure with hypoxia: Secondary | ICD-10-CM | POA: Diagnosis not present

## 2018-03-17 DIAGNOSIS — E1121 Type 2 diabetes mellitus with diabetic nephropathy: Secondary | ICD-10-CM | POA: Diagnosis not present

## 2018-03-17 DIAGNOSIS — I130 Hypertensive heart and chronic kidney disease with heart failure and stage 1 through stage 4 chronic kidney disease, or unspecified chronic kidney disease: Secondary | ICD-10-CM | POA: Diagnosis not present

## 2018-03-20 ENCOUNTER — Other Ambulatory Visit: Payer: Self-pay | Admitting: Family Medicine

## 2018-03-20 DIAGNOSIS — N183 Chronic kidney disease, stage 3 (moderate): Secondary | ICD-10-CM | POA: Diagnosis not present

## 2018-03-20 DIAGNOSIS — I130 Hypertensive heart and chronic kidney disease with heart failure and stage 1 through stage 4 chronic kidney disease, or unspecified chronic kidney disease: Secondary | ICD-10-CM | POA: Diagnosis not present

## 2018-03-20 DIAGNOSIS — E1121 Type 2 diabetes mellitus with diabetic nephropathy: Secondary | ICD-10-CM | POA: Diagnosis not present

## 2018-03-20 DIAGNOSIS — E1122 Type 2 diabetes mellitus with diabetic chronic kidney disease: Secondary | ICD-10-CM | POA: Diagnosis not present

## 2018-03-20 DIAGNOSIS — I5032 Chronic diastolic (congestive) heart failure: Secondary | ICD-10-CM | POA: Diagnosis not present

## 2018-03-20 DIAGNOSIS — J9611 Chronic respiratory failure with hypoxia: Secondary | ICD-10-CM | POA: Diagnosis not present

## 2018-03-21 DIAGNOSIS — E1122 Type 2 diabetes mellitus with diabetic chronic kidney disease: Secondary | ICD-10-CM | POA: Diagnosis not present

## 2018-03-21 DIAGNOSIS — N183 Chronic kidney disease, stage 3 (moderate): Secondary | ICD-10-CM | POA: Diagnosis not present

## 2018-03-21 DIAGNOSIS — I5032 Chronic diastolic (congestive) heart failure: Secondary | ICD-10-CM | POA: Diagnosis not present

## 2018-03-21 DIAGNOSIS — E1121 Type 2 diabetes mellitus with diabetic nephropathy: Secondary | ICD-10-CM | POA: Diagnosis not present

## 2018-03-21 DIAGNOSIS — J9611 Chronic respiratory failure with hypoxia: Secondary | ICD-10-CM | POA: Diagnosis not present

## 2018-03-21 DIAGNOSIS — I130 Hypertensive heart and chronic kidney disease with heart failure and stage 1 through stage 4 chronic kidney disease, or unspecified chronic kidney disease: Secondary | ICD-10-CM | POA: Diagnosis not present

## 2018-03-22 DIAGNOSIS — I5032 Chronic diastolic (congestive) heart failure: Secondary | ICD-10-CM | POA: Diagnosis not present

## 2018-03-22 DIAGNOSIS — E1121 Type 2 diabetes mellitus with diabetic nephropathy: Secondary | ICD-10-CM | POA: Diagnosis not present

## 2018-03-22 DIAGNOSIS — J9611 Chronic respiratory failure with hypoxia: Secondary | ICD-10-CM | POA: Diagnosis not present

## 2018-03-22 DIAGNOSIS — E1122 Type 2 diabetes mellitus with diabetic chronic kidney disease: Secondary | ICD-10-CM | POA: Diagnosis not present

## 2018-03-22 DIAGNOSIS — N183 Chronic kidney disease, stage 3 (moderate): Secondary | ICD-10-CM | POA: Diagnosis not present

## 2018-03-22 DIAGNOSIS — I130 Hypertensive heart and chronic kidney disease with heart failure and stage 1 through stage 4 chronic kidney disease, or unspecified chronic kidney disease: Secondary | ICD-10-CM | POA: Diagnosis not present

## 2018-03-23 ENCOUNTER — Telehealth: Payer: Self-pay

## 2018-03-23 ENCOUNTER — Ambulatory Visit: Payer: TRICARE For Life (TFL)

## 2018-03-23 ENCOUNTER — Encounter: Payer: TRICARE For Life (TFL) | Admitting: Family Medicine

## 2018-03-23 DIAGNOSIS — I130 Hypertensive heart and chronic kidney disease with heart failure and stage 1 through stage 4 chronic kidney disease, or unspecified chronic kidney disease: Secondary | ICD-10-CM | POA: Diagnosis not present

## 2018-03-23 DIAGNOSIS — J9611 Chronic respiratory failure with hypoxia: Secondary | ICD-10-CM | POA: Diagnosis not present

## 2018-03-23 DIAGNOSIS — E1121 Type 2 diabetes mellitus with diabetic nephropathy: Secondary | ICD-10-CM | POA: Diagnosis not present

## 2018-03-23 DIAGNOSIS — E1122 Type 2 diabetes mellitus with diabetic chronic kidney disease: Secondary | ICD-10-CM | POA: Diagnosis not present

## 2018-03-23 DIAGNOSIS — N183 Chronic kidney disease, stage 3 (moderate): Secondary | ICD-10-CM | POA: Diagnosis not present

## 2018-03-23 DIAGNOSIS — I5032 Chronic diastolic (congestive) heart failure: Secondary | ICD-10-CM | POA: Diagnosis not present

## 2018-03-23 NOTE — Telephone Encounter (Signed)
Stacy with Hospice left v/m that pt fell on 03/21/18; on 03/22/18 pt had a lot of pain in rt side and was early AM so contacted Hospice provider who gave pt tramadol q6h scheduled for 2 days. Marzetta Board is requesting to extend the tramadol order for 3 more days to get pt thru the weekend. Stacy request cb. Dr Darnell Level out of office.Please advise.

## 2018-03-23 NOTE — Telephone Encounter (Signed)
Stacy advised 

## 2018-03-23 NOTE — Telephone Encounter (Signed)
Please give the order, if rx needs to be sent then let me know.  Update [place] if pain isn't controlled or if worsening.  Thanks.

## 2018-03-24 DIAGNOSIS — E1121 Type 2 diabetes mellitus with diabetic nephropathy: Secondary | ICD-10-CM | POA: Diagnosis not present

## 2018-03-24 DIAGNOSIS — N183 Chronic kidney disease, stage 3 (moderate): Secondary | ICD-10-CM | POA: Diagnosis not present

## 2018-03-24 DIAGNOSIS — I5032 Chronic diastolic (congestive) heart failure: Secondary | ICD-10-CM | POA: Diagnosis not present

## 2018-03-24 DIAGNOSIS — J9611 Chronic respiratory failure with hypoxia: Secondary | ICD-10-CM | POA: Diagnosis not present

## 2018-03-24 DIAGNOSIS — I130 Hypertensive heart and chronic kidney disease with heart failure and stage 1 through stage 4 chronic kidney disease, or unspecified chronic kidney disease: Secondary | ICD-10-CM | POA: Diagnosis not present

## 2018-03-24 DIAGNOSIS — E1122 Type 2 diabetes mellitus with diabetic chronic kidney disease: Secondary | ICD-10-CM | POA: Diagnosis not present

## 2018-03-25 DIAGNOSIS — N183 Chronic kidney disease, stage 3 (moderate): Secondary | ICD-10-CM | POA: Diagnosis not present

## 2018-03-25 DIAGNOSIS — E1122 Type 2 diabetes mellitus with diabetic chronic kidney disease: Secondary | ICD-10-CM | POA: Diagnosis not present

## 2018-03-25 DIAGNOSIS — E1121 Type 2 diabetes mellitus with diabetic nephropathy: Secondary | ICD-10-CM | POA: Diagnosis not present

## 2018-03-25 DIAGNOSIS — I130 Hypertensive heart and chronic kidney disease with heart failure and stage 1 through stage 4 chronic kidney disease, or unspecified chronic kidney disease: Secondary | ICD-10-CM | POA: Diagnosis not present

## 2018-03-25 DIAGNOSIS — J9611 Chronic respiratory failure with hypoxia: Secondary | ICD-10-CM | POA: Diagnosis not present

## 2018-03-25 DIAGNOSIS — I5032 Chronic diastolic (congestive) heart failure: Secondary | ICD-10-CM | POA: Diagnosis not present

## 2018-03-27 DIAGNOSIS — I5032 Chronic diastolic (congestive) heart failure: Secondary | ICD-10-CM | POA: Diagnosis not present

## 2018-03-27 DIAGNOSIS — E1121 Type 2 diabetes mellitus with diabetic nephropathy: Secondary | ICD-10-CM | POA: Diagnosis not present

## 2018-03-27 DIAGNOSIS — J9611 Chronic respiratory failure with hypoxia: Secondary | ICD-10-CM | POA: Diagnosis not present

## 2018-03-27 DIAGNOSIS — E1122 Type 2 diabetes mellitus with diabetic chronic kidney disease: Secondary | ICD-10-CM | POA: Diagnosis not present

## 2018-03-27 DIAGNOSIS — I130 Hypertensive heart and chronic kidney disease with heart failure and stage 1 through stage 4 chronic kidney disease, or unspecified chronic kidney disease: Secondary | ICD-10-CM | POA: Diagnosis not present

## 2018-03-27 DIAGNOSIS — N183 Chronic kidney disease, stage 3 (moderate): Secondary | ICD-10-CM | POA: Diagnosis not present

## 2018-03-28 DIAGNOSIS — E1121 Type 2 diabetes mellitus with diabetic nephropathy: Secondary | ICD-10-CM | POA: Diagnosis not present

## 2018-03-28 DIAGNOSIS — J9611 Chronic respiratory failure with hypoxia: Secondary | ICD-10-CM | POA: Diagnosis not present

## 2018-03-28 DIAGNOSIS — I130 Hypertensive heart and chronic kidney disease with heart failure and stage 1 through stage 4 chronic kidney disease, or unspecified chronic kidney disease: Secondary | ICD-10-CM | POA: Diagnosis not present

## 2018-03-28 DIAGNOSIS — I5032 Chronic diastolic (congestive) heart failure: Secondary | ICD-10-CM | POA: Diagnosis not present

## 2018-03-28 DIAGNOSIS — E1122 Type 2 diabetes mellitus with diabetic chronic kidney disease: Secondary | ICD-10-CM | POA: Diagnosis not present

## 2018-03-28 DIAGNOSIS — N183 Chronic kidney disease, stage 3 (moderate): Secondary | ICD-10-CM | POA: Diagnosis not present

## 2018-03-29 DIAGNOSIS — N183 Chronic kidney disease, stage 3 (moderate): Secondary | ICD-10-CM | POA: Diagnosis not present

## 2018-03-29 DIAGNOSIS — J9611 Chronic respiratory failure with hypoxia: Secondary | ICD-10-CM | POA: Diagnosis not present

## 2018-03-29 DIAGNOSIS — E1122 Type 2 diabetes mellitus with diabetic chronic kidney disease: Secondary | ICD-10-CM | POA: Diagnosis not present

## 2018-03-29 DIAGNOSIS — I5032 Chronic diastolic (congestive) heart failure: Secondary | ICD-10-CM | POA: Diagnosis not present

## 2018-03-29 DIAGNOSIS — I130 Hypertensive heart and chronic kidney disease with heart failure and stage 1 through stage 4 chronic kidney disease, or unspecified chronic kidney disease: Secondary | ICD-10-CM | POA: Diagnosis not present

## 2018-03-29 DIAGNOSIS — E1121 Type 2 diabetes mellitus with diabetic nephropathy: Secondary | ICD-10-CM | POA: Diagnosis not present

## 2018-03-30 DIAGNOSIS — N183 Chronic kidney disease, stage 3 (moderate): Secondary | ICD-10-CM | POA: Diagnosis not present

## 2018-03-30 DIAGNOSIS — E1121 Type 2 diabetes mellitus with diabetic nephropathy: Secondary | ICD-10-CM | POA: Diagnosis not present

## 2018-03-30 DIAGNOSIS — I130 Hypertensive heart and chronic kidney disease with heart failure and stage 1 through stage 4 chronic kidney disease, or unspecified chronic kidney disease: Secondary | ICD-10-CM | POA: Diagnosis not present

## 2018-03-30 DIAGNOSIS — I5032 Chronic diastolic (congestive) heart failure: Secondary | ICD-10-CM | POA: Diagnosis not present

## 2018-03-30 DIAGNOSIS — J9611 Chronic respiratory failure with hypoxia: Secondary | ICD-10-CM | POA: Diagnosis not present

## 2018-03-30 DIAGNOSIS — E1122 Type 2 diabetes mellitus with diabetic chronic kidney disease: Secondary | ICD-10-CM | POA: Diagnosis not present

## 2018-03-31 DIAGNOSIS — J9611 Chronic respiratory failure with hypoxia: Secondary | ICD-10-CM | POA: Diagnosis not present

## 2018-03-31 DIAGNOSIS — I5032 Chronic diastolic (congestive) heart failure: Secondary | ICD-10-CM | POA: Diagnosis not present

## 2018-03-31 DIAGNOSIS — E1121 Type 2 diabetes mellitus with diabetic nephropathy: Secondary | ICD-10-CM | POA: Diagnosis not present

## 2018-03-31 DIAGNOSIS — I130 Hypertensive heart and chronic kidney disease with heart failure and stage 1 through stage 4 chronic kidney disease, or unspecified chronic kidney disease: Secondary | ICD-10-CM | POA: Diagnosis not present

## 2018-03-31 DIAGNOSIS — N183 Chronic kidney disease, stage 3 (moderate): Secondary | ICD-10-CM | POA: Diagnosis not present

## 2018-03-31 DIAGNOSIS — E1122 Type 2 diabetes mellitus with diabetic chronic kidney disease: Secondary | ICD-10-CM | POA: Diagnosis not present

## 2018-04-02 DIAGNOSIS — N183 Chronic kidney disease, stage 3 (moderate): Secondary | ICD-10-CM | POA: Diagnosis not present

## 2018-04-02 DIAGNOSIS — E1122 Type 2 diabetes mellitus with diabetic chronic kidney disease: Secondary | ICD-10-CM | POA: Diagnosis not present

## 2018-04-02 DIAGNOSIS — B379 Candidiasis, unspecified: Secondary | ICD-10-CM | POA: Diagnosis not present

## 2018-04-02 DIAGNOSIS — R32 Unspecified urinary incontinence: Secondary | ICD-10-CM | POA: Diagnosis not present

## 2018-04-02 DIAGNOSIS — I5032 Chronic diastolic (congestive) heart failure: Secondary | ICD-10-CM | POA: Diagnosis not present

## 2018-04-02 DIAGNOSIS — Z9181 History of falling: Secondary | ICD-10-CM | POA: Diagnosis not present

## 2018-04-02 DIAGNOSIS — Z7902 Long term (current) use of antithrombotics/antiplatelets: Secondary | ICD-10-CM | POA: Diagnosis not present

## 2018-04-02 DIAGNOSIS — I25700 Atherosclerosis of coronary artery bypass graft(s), unspecified, with unstable angina pectoris: Secondary | ICD-10-CM | POA: Diagnosis not present

## 2018-04-02 DIAGNOSIS — Z7984 Long term (current) use of oral hypoglycemic drugs: Secondary | ICD-10-CM | POA: Diagnosis not present

## 2018-04-02 DIAGNOSIS — Z7901 Long term (current) use of anticoagulants: Secondary | ICD-10-CM | POA: Diagnosis not present

## 2018-04-02 DIAGNOSIS — Z9981 Dependence on supplemental oxygen: Secondary | ICD-10-CM | POA: Diagnosis not present

## 2018-04-02 DIAGNOSIS — E785 Hyperlipidemia, unspecified: Secondary | ICD-10-CM | POA: Diagnosis not present

## 2018-04-02 DIAGNOSIS — R296 Repeated falls: Secondary | ICD-10-CM | POA: Diagnosis not present

## 2018-04-02 DIAGNOSIS — I130 Hypertensive heart and chronic kidney disease with heart failure and stage 1 through stage 4 chronic kidney disease, or unspecified chronic kidney disease: Secondary | ICD-10-CM | POA: Diagnosis not present

## 2018-04-02 DIAGNOSIS — J9611 Chronic respiratory failure with hypoxia: Secondary | ICD-10-CM | POA: Diagnosis not present

## 2018-04-02 DIAGNOSIS — Z741 Need for assistance with personal care: Secondary | ICD-10-CM | POA: Diagnosis not present

## 2018-04-02 DIAGNOSIS — N390 Urinary tract infection, site not specified: Secondary | ICD-10-CM | POA: Diagnosis not present

## 2018-04-02 DIAGNOSIS — Z681 Body mass index (BMI) 19 or less, adult: Secondary | ICD-10-CM | POA: Diagnosis not present

## 2018-04-02 DIAGNOSIS — Z947 Corneal transplant status: Secondary | ICD-10-CM | POA: Diagnosis not present

## 2018-04-02 DIAGNOSIS — E1121 Type 2 diabetes mellitus with diabetic nephropathy: Secondary | ICD-10-CM | POA: Diagnosis not present

## 2018-04-03 DIAGNOSIS — I5032 Chronic diastolic (congestive) heart failure: Secondary | ICD-10-CM | POA: Diagnosis not present

## 2018-04-03 DIAGNOSIS — E1122 Type 2 diabetes mellitus with diabetic chronic kidney disease: Secondary | ICD-10-CM | POA: Diagnosis not present

## 2018-04-03 DIAGNOSIS — E1121 Type 2 diabetes mellitus with diabetic nephropathy: Secondary | ICD-10-CM | POA: Diagnosis not present

## 2018-04-03 DIAGNOSIS — I25700 Atherosclerosis of coronary artery bypass graft(s), unspecified, with unstable angina pectoris: Secondary | ICD-10-CM | POA: Diagnosis not present

## 2018-04-03 DIAGNOSIS — N183 Chronic kidney disease, stage 3 (moderate): Secondary | ICD-10-CM | POA: Diagnosis not present

## 2018-04-03 DIAGNOSIS — I130 Hypertensive heart and chronic kidney disease with heart failure and stage 1 through stage 4 chronic kidney disease, or unspecified chronic kidney disease: Secondary | ICD-10-CM | POA: Diagnosis not present

## 2018-04-04 DIAGNOSIS — E1122 Type 2 diabetes mellitus with diabetic chronic kidney disease: Secondary | ICD-10-CM | POA: Diagnosis not present

## 2018-04-04 DIAGNOSIS — I5032 Chronic diastolic (congestive) heart failure: Secondary | ICD-10-CM | POA: Diagnosis not present

## 2018-04-04 DIAGNOSIS — I130 Hypertensive heart and chronic kidney disease with heart failure and stage 1 through stage 4 chronic kidney disease, or unspecified chronic kidney disease: Secondary | ICD-10-CM | POA: Diagnosis not present

## 2018-04-04 DIAGNOSIS — E1121 Type 2 diabetes mellitus with diabetic nephropathy: Secondary | ICD-10-CM | POA: Diagnosis not present

## 2018-04-04 DIAGNOSIS — I25700 Atherosclerosis of coronary artery bypass graft(s), unspecified, with unstable angina pectoris: Secondary | ICD-10-CM | POA: Diagnosis not present

## 2018-04-04 DIAGNOSIS — N183 Chronic kidney disease, stage 3 (moderate): Secondary | ICD-10-CM | POA: Diagnosis not present

## 2018-04-05 DIAGNOSIS — E1121 Type 2 diabetes mellitus with diabetic nephropathy: Secondary | ICD-10-CM | POA: Diagnosis not present

## 2018-04-05 DIAGNOSIS — E1122 Type 2 diabetes mellitus with diabetic chronic kidney disease: Secondary | ICD-10-CM | POA: Diagnosis not present

## 2018-04-05 DIAGNOSIS — I130 Hypertensive heart and chronic kidney disease with heart failure and stage 1 through stage 4 chronic kidney disease, or unspecified chronic kidney disease: Secondary | ICD-10-CM | POA: Diagnosis not present

## 2018-04-05 DIAGNOSIS — I25700 Atherosclerosis of coronary artery bypass graft(s), unspecified, with unstable angina pectoris: Secondary | ICD-10-CM | POA: Diagnosis not present

## 2018-04-05 DIAGNOSIS — I5032 Chronic diastolic (congestive) heart failure: Secondary | ICD-10-CM | POA: Diagnosis not present

## 2018-04-05 DIAGNOSIS — N183 Chronic kidney disease, stage 3 (moderate): Secondary | ICD-10-CM | POA: Diagnosis not present

## 2018-04-06 DIAGNOSIS — I130 Hypertensive heart and chronic kidney disease with heart failure and stage 1 through stage 4 chronic kidney disease, or unspecified chronic kidney disease: Secondary | ICD-10-CM | POA: Diagnosis not present

## 2018-04-06 DIAGNOSIS — E1122 Type 2 diabetes mellitus with diabetic chronic kidney disease: Secondary | ICD-10-CM | POA: Diagnosis not present

## 2018-04-06 DIAGNOSIS — I25700 Atherosclerosis of coronary artery bypass graft(s), unspecified, with unstable angina pectoris: Secondary | ICD-10-CM | POA: Diagnosis not present

## 2018-04-06 DIAGNOSIS — E1121 Type 2 diabetes mellitus with diabetic nephropathy: Secondary | ICD-10-CM | POA: Diagnosis not present

## 2018-04-06 DIAGNOSIS — I5032 Chronic diastolic (congestive) heart failure: Secondary | ICD-10-CM | POA: Diagnosis not present

## 2018-04-06 DIAGNOSIS — N183 Chronic kidney disease, stage 3 (moderate): Secondary | ICD-10-CM | POA: Diagnosis not present

## 2018-04-07 DIAGNOSIS — N183 Chronic kidney disease, stage 3 (moderate): Secondary | ICD-10-CM | POA: Diagnosis not present

## 2018-04-07 DIAGNOSIS — I5032 Chronic diastolic (congestive) heart failure: Secondary | ICD-10-CM | POA: Diagnosis not present

## 2018-04-07 DIAGNOSIS — E1122 Type 2 diabetes mellitus with diabetic chronic kidney disease: Secondary | ICD-10-CM | POA: Diagnosis not present

## 2018-04-07 DIAGNOSIS — E1121 Type 2 diabetes mellitus with diabetic nephropathy: Secondary | ICD-10-CM | POA: Diagnosis not present

## 2018-04-07 DIAGNOSIS — I25700 Atherosclerosis of coronary artery bypass graft(s), unspecified, with unstable angina pectoris: Secondary | ICD-10-CM | POA: Diagnosis not present

## 2018-04-07 DIAGNOSIS — I130 Hypertensive heart and chronic kidney disease with heart failure and stage 1 through stage 4 chronic kidney disease, or unspecified chronic kidney disease: Secondary | ICD-10-CM | POA: Diagnosis not present

## 2018-04-10 DIAGNOSIS — I130 Hypertensive heart and chronic kidney disease with heart failure and stage 1 through stage 4 chronic kidney disease, or unspecified chronic kidney disease: Secondary | ICD-10-CM | POA: Diagnosis not present

## 2018-04-10 DIAGNOSIS — E1121 Type 2 diabetes mellitus with diabetic nephropathy: Secondary | ICD-10-CM | POA: Diagnosis not present

## 2018-04-10 DIAGNOSIS — I5032 Chronic diastolic (congestive) heart failure: Secondary | ICD-10-CM | POA: Diagnosis not present

## 2018-04-10 DIAGNOSIS — I25700 Atherosclerosis of coronary artery bypass graft(s), unspecified, with unstable angina pectoris: Secondary | ICD-10-CM | POA: Diagnosis not present

## 2018-04-10 DIAGNOSIS — E1122 Type 2 diabetes mellitus with diabetic chronic kidney disease: Secondary | ICD-10-CM | POA: Diagnosis not present

## 2018-04-10 DIAGNOSIS — N183 Chronic kidney disease, stage 3 (moderate): Secondary | ICD-10-CM | POA: Diagnosis not present

## 2018-04-11 DIAGNOSIS — I130 Hypertensive heart and chronic kidney disease with heart failure and stage 1 through stage 4 chronic kidney disease, or unspecified chronic kidney disease: Secondary | ICD-10-CM | POA: Diagnosis not present

## 2018-04-11 DIAGNOSIS — I25700 Atherosclerosis of coronary artery bypass graft(s), unspecified, with unstable angina pectoris: Secondary | ICD-10-CM | POA: Diagnosis not present

## 2018-04-11 DIAGNOSIS — I5032 Chronic diastolic (congestive) heart failure: Secondary | ICD-10-CM | POA: Diagnosis not present

## 2018-04-11 DIAGNOSIS — E1121 Type 2 diabetes mellitus with diabetic nephropathy: Secondary | ICD-10-CM | POA: Diagnosis not present

## 2018-04-11 DIAGNOSIS — E1122 Type 2 diabetes mellitus with diabetic chronic kidney disease: Secondary | ICD-10-CM | POA: Diagnosis not present

## 2018-04-11 DIAGNOSIS — N183 Chronic kidney disease, stage 3 (moderate): Secondary | ICD-10-CM | POA: Diagnosis not present

## 2018-04-12 DIAGNOSIS — I130 Hypertensive heart and chronic kidney disease with heart failure and stage 1 through stage 4 chronic kidney disease, or unspecified chronic kidney disease: Secondary | ICD-10-CM | POA: Diagnosis not present

## 2018-04-12 DIAGNOSIS — I25700 Atherosclerosis of coronary artery bypass graft(s), unspecified, with unstable angina pectoris: Secondary | ICD-10-CM | POA: Diagnosis not present

## 2018-04-12 DIAGNOSIS — N183 Chronic kidney disease, stage 3 (moderate): Secondary | ICD-10-CM | POA: Diagnosis not present

## 2018-04-12 DIAGNOSIS — E1122 Type 2 diabetes mellitus with diabetic chronic kidney disease: Secondary | ICD-10-CM | POA: Diagnosis not present

## 2018-04-12 DIAGNOSIS — E1121 Type 2 diabetes mellitus with diabetic nephropathy: Secondary | ICD-10-CM | POA: Diagnosis not present

## 2018-04-12 DIAGNOSIS — I5032 Chronic diastolic (congestive) heart failure: Secondary | ICD-10-CM | POA: Diagnosis not present

## 2018-04-13 DIAGNOSIS — I130 Hypertensive heart and chronic kidney disease with heart failure and stage 1 through stage 4 chronic kidney disease, or unspecified chronic kidney disease: Secondary | ICD-10-CM | POA: Diagnosis not present

## 2018-04-13 DIAGNOSIS — E1121 Type 2 diabetes mellitus with diabetic nephropathy: Secondary | ICD-10-CM | POA: Diagnosis not present

## 2018-04-13 DIAGNOSIS — I25700 Atherosclerosis of coronary artery bypass graft(s), unspecified, with unstable angina pectoris: Secondary | ICD-10-CM | POA: Diagnosis not present

## 2018-04-13 DIAGNOSIS — N183 Chronic kidney disease, stage 3 (moderate): Secondary | ICD-10-CM | POA: Diagnosis not present

## 2018-04-13 DIAGNOSIS — E1122 Type 2 diabetes mellitus with diabetic chronic kidney disease: Secondary | ICD-10-CM | POA: Diagnosis not present

## 2018-04-13 DIAGNOSIS — I5032 Chronic diastolic (congestive) heart failure: Secondary | ICD-10-CM | POA: Diagnosis not present

## 2018-04-14 DIAGNOSIS — I5032 Chronic diastolic (congestive) heart failure: Secondary | ICD-10-CM | POA: Diagnosis not present

## 2018-04-14 DIAGNOSIS — I130 Hypertensive heart and chronic kidney disease with heart failure and stage 1 through stage 4 chronic kidney disease, or unspecified chronic kidney disease: Secondary | ICD-10-CM | POA: Diagnosis not present

## 2018-04-14 DIAGNOSIS — E1122 Type 2 diabetes mellitus with diabetic chronic kidney disease: Secondary | ICD-10-CM | POA: Diagnosis not present

## 2018-04-14 DIAGNOSIS — I25700 Atherosclerosis of coronary artery bypass graft(s), unspecified, with unstable angina pectoris: Secondary | ICD-10-CM | POA: Diagnosis not present

## 2018-04-14 DIAGNOSIS — N183 Chronic kidney disease, stage 3 (moderate): Secondary | ICD-10-CM | POA: Diagnosis not present

## 2018-04-14 DIAGNOSIS — E1121 Type 2 diabetes mellitus with diabetic nephropathy: Secondary | ICD-10-CM | POA: Diagnosis not present

## 2018-04-17 DIAGNOSIS — E1121 Type 2 diabetes mellitus with diabetic nephropathy: Secondary | ICD-10-CM | POA: Diagnosis not present

## 2018-04-17 DIAGNOSIS — N183 Chronic kidney disease, stage 3 (moderate): Secondary | ICD-10-CM | POA: Diagnosis not present

## 2018-04-17 DIAGNOSIS — I5032 Chronic diastolic (congestive) heart failure: Secondary | ICD-10-CM | POA: Diagnosis not present

## 2018-04-17 DIAGNOSIS — I25700 Atherosclerosis of coronary artery bypass graft(s), unspecified, with unstable angina pectoris: Secondary | ICD-10-CM | POA: Diagnosis not present

## 2018-04-17 DIAGNOSIS — E1122 Type 2 diabetes mellitus with diabetic chronic kidney disease: Secondary | ICD-10-CM | POA: Diagnosis not present

## 2018-04-17 DIAGNOSIS — I130 Hypertensive heart and chronic kidney disease with heart failure and stage 1 through stage 4 chronic kidney disease, or unspecified chronic kidney disease: Secondary | ICD-10-CM | POA: Diagnosis not present

## 2018-04-18 DIAGNOSIS — I25700 Atherosclerosis of coronary artery bypass graft(s), unspecified, with unstable angina pectoris: Secondary | ICD-10-CM | POA: Diagnosis not present

## 2018-04-18 DIAGNOSIS — I130 Hypertensive heart and chronic kidney disease with heart failure and stage 1 through stage 4 chronic kidney disease, or unspecified chronic kidney disease: Secondary | ICD-10-CM | POA: Diagnosis not present

## 2018-04-18 DIAGNOSIS — E1121 Type 2 diabetes mellitus with diabetic nephropathy: Secondary | ICD-10-CM | POA: Diagnosis not present

## 2018-04-18 DIAGNOSIS — N183 Chronic kidney disease, stage 3 (moderate): Secondary | ICD-10-CM | POA: Diagnosis not present

## 2018-04-18 DIAGNOSIS — E1122 Type 2 diabetes mellitus with diabetic chronic kidney disease: Secondary | ICD-10-CM | POA: Diagnosis not present

## 2018-04-18 DIAGNOSIS — I5032 Chronic diastolic (congestive) heart failure: Secondary | ICD-10-CM | POA: Diagnosis not present

## 2018-04-19 ENCOUNTER — Telehealth: Payer: Self-pay

## 2018-04-19 DIAGNOSIS — I25700 Atherosclerosis of coronary artery bypass graft(s), unspecified, with unstable angina pectoris: Secondary | ICD-10-CM | POA: Diagnosis not present

## 2018-04-19 DIAGNOSIS — E1121 Type 2 diabetes mellitus with diabetic nephropathy: Secondary | ICD-10-CM | POA: Diagnosis not present

## 2018-04-19 DIAGNOSIS — E1122 Type 2 diabetes mellitus with diabetic chronic kidney disease: Secondary | ICD-10-CM | POA: Diagnosis not present

## 2018-04-19 DIAGNOSIS — I130 Hypertensive heart and chronic kidney disease with heart failure and stage 1 through stage 4 chronic kidney disease, or unspecified chronic kidney disease: Secondary | ICD-10-CM | POA: Diagnosis not present

## 2018-04-19 DIAGNOSIS — I5032 Chronic diastolic (congestive) heart failure: Secondary | ICD-10-CM | POA: Diagnosis not present

## 2018-04-19 DIAGNOSIS — N183 Chronic kidney disease, stage 3 (moderate): Secondary | ICD-10-CM | POA: Diagnosis not present

## 2018-04-19 NOTE — Telephone Encounter (Signed)
Thank her for her message. I agree safest place for her currently is to stay at [facility] due to current coronavirus situation - we would have likely called to cancel appointment anyways. I expect hospice will be able to continue visiting her.  I hope she is well and give her my regards.

## 2018-04-19 NOTE — Telephone Encounter (Signed)
Pt left v/m pt did not leave contact # pt has appt with Dr Darnell Level for CPX on 04/21/18 at 11:30. Pt is on lock down and can not come to office. Pt said Dr Darnell Level could come there or Hospice could come there. Please advise.

## 2018-04-20 DIAGNOSIS — I5032 Chronic diastolic (congestive) heart failure: Secondary | ICD-10-CM | POA: Diagnosis not present

## 2018-04-20 DIAGNOSIS — E1121 Type 2 diabetes mellitus with diabetic nephropathy: Secondary | ICD-10-CM | POA: Diagnosis not present

## 2018-04-20 DIAGNOSIS — E1122 Type 2 diabetes mellitus with diabetic chronic kidney disease: Secondary | ICD-10-CM | POA: Diagnosis not present

## 2018-04-20 DIAGNOSIS — I130 Hypertensive heart and chronic kidney disease with heart failure and stage 1 through stage 4 chronic kidney disease, or unspecified chronic kidney disease: Secondary | ICD-10-CM | POA: Diagnosis not present

## 2018-04-20 DIAGNOSIS — I25700 Atherosclerosis of coronary artery bypass graft(s), unspecified, with unstable angina pectoris: Secondary | ICD-10-CM | POA: Diagnosis not present

## 2018-04-20 DIAGNOSIS — N183 Chronic kidney disease, stage 3 (moderate): Secondary | ICD-10-CM | POA: Diagnosis not present

## 2018-04-20 NOTE — Telephone Encounter (Signed)
[place] contacted pt and notified her the OV will be c/x.

## 2018-04-21 ENCOUNTER — Ambulatory Visit: Payer: TRICARE For Life (TFL)

## 2018-04-21 ENCOUNTER — Encounter: Payer: Medicare Other | Admitting: Family Medicine

## 2018-04-21 DIAGNOSIS — I5032 Chronic diastolic (congestive) heart failure: Secondary | ICD-10-CM | POA: Diagnosis not present

## 2018-04-21 DIAGNOSIS — I130 Hypertensive heart and chronic kidney disease with heart failure and stage 1 through stage 4 chronic kidney disease, or unspecified chronic kidney disease: Secondary | ICD-10-CM | POA: Diagnosis not present

## 2018-04-21 DIAGNOSIS — N183 Chronic kidney disease, stage 3 (moderate): Secondary | ICD-10-CM | POA: Diagnosis not present

## 2018-04-21 DIAGNOSIS — E1122 Type 2 diabetes mellitus with diabetic chronic kidney disease: Secondary | ICD-10-CM | POA: Diagnosis not present

## 2018-04-21 DIAGNOSIS — E1121 Type 2 diabetes mellitus with diabetic nephropathy: Secondary | ICD-10-CM | POA: Diagnosis not present

## 2018-04-21 DIAGNOSIS — I25700 Atherosclerosis of coronary artery bypass graft(s), unspecified, with unstable angina pectoris: Secondary | ICD-10-CM | POA: Diagnosis not present

## 2018-04-23 ENCOUNTER — Telehealth: Payer: Self-pay

## 2018-04-24 ENCOUNTER — Other Ambulatory Visit: Payer: Self-pay | Admitting: Family Medicine

## 2018-04-24 ENCOUNTER — Telehealth: Payer: Self-pay | Admitting: Family Medicine

## 2018-04-24 DIAGNOSIS — I130 Hypertensive heart and chronic kidney disease with heart failure and stage 1 through stage 4 chronic kidney disease, or unspecified chronic kidney disease: Secondary | ICD-10-CM | POA: Diagnosis not present

## 2018-04-24 DIAGNOSIS — N183 Chronic kidney disease, stage 3 (moderate): Secondary | ICD-10-CM | POA: Diagnosis not present

## 2018-04-24 DIAGNOSIS — I25700 Atherosclerosis of coronary artery bypass graft(s), unspecified, with unstable angina pectoris: Secondary | ICD-10-CM | POA: Diagnosis not present

## 2018-04-24 DIAGNOSIS — I5032 Chronic diastolic (congestive) heart failure: Secondary | ICD-10-CM | POA: Diagnosis not present

## 2018-04-24 DIAGNOSIS — E1122 Type 2 diabetes mellitus with diabetic chronic kidney disease: Secondary | ICD-10-CM | POA: Diagnosis not present

## 2018-04-24 DIAGNOSIS — E1121 Type 2 diabetes mellitus with diabetic nephropathy: Secondary | ICD-10-CM | POA: Diagnosis not present

## 2018-04-24 MED ORDER — DIPHENHYDRAMINE HCL 25 MG PO TABS: 12.5000 mg | ORAL_TABLET | Freq: Two times a day (BID) | ORAL | 0 refills | Status: AC | PRN

## 2018-04-24 NOTE — Telephone Encounter (Signed)
Please refer to on call note for documentation around response.

## 2018-04-24 NOTE — Telephone Encounter (Addendum)
Thanks! beandryl sent in to [facility]. plz ntoify Christian at [phone]

## 2018-04-24 NOTE — Telephone Encounter (Signed)
[facility] Night - Client Nonclinical Telephone Record [place] Night - Client Client Site [place] Physician Ria Bush - MD Contact Type Call Who Is [place] [facility] [place] Call Type Provider Call PC Page Now Reason for Call Request to speak to Physician Initial Comment Caller states she needs a rx for Benadryl. Pt is in a nursing home. They need a hard script. Additional Comment Patient Name Christine Blanchard Patient DOB 02-09-1928 Requesting Provider Anderson Malta, RN Physician Number [phone] Facility Name [facility] Phone DateTime Result/Outcome Message Type Notes Briscoe Deutscher- DO [phone] 04/23/2018 7:14:51 PM Called On Call Provider - Reached Doctor Paged Briscoe Deutscher- DO 04/23/2018 7:15:18 PM Spoke with On Call - General Message Result Call Closed By: Sarajane Marek Transaction Date/Time: 04/23/2018 6:58:27 PM (ET)

## 2018-04-24 NOTE — Telephone Encounter (Signed)
Physician on call received request last night for Benadryl 25 mg order for itching. Spoke with [place] at [facility], [phone]. Unable to send order as they would only accept fax. Would not accept verbal order or e-scribe. Will send to PCP to review. Briscoe Deutscher, DO 04/24/2018

## 2018-04-24 NOTE — Addendum Note (Signed)
Addended by: Ria Bush on: 04/24/2018 07:51 AM   Modules accepted: Orders

## 2018-04-24 NOTE — Telephone Encounter (Signed)
Spoke with [employer], who works with Darrick Meigs, notifying her Benadryl was sent to [facility].  Says she will relay the message.

## 2018-04-25 DIAGNOSIS — I130 Hypertensive heart and chronic kidney disease with heart failure and stage 1 through stage 4 chronic kidney disease, or unspecified chronic kidney disease: Secondary | ICD-10-CM | POA: Diagnosis not present

## 2018-04-25 DIAGNOSIS — I5032 Chronic diastolic (congestive) heart failure: Secondary | ICD-10-CM | POA: Diagnosis not present

## 2018-04-25 DIAGNOSIS — E1121 Type 2 diabetes mellitus with diabetic nephropathy: Secondary | ICD-10-CM | POA: Diagnosis not present

## 2018-04-25 DIAGNOSIS — N183 Chronic kidney disease, stage 3 (moderate): Secondary | ICD-10-CM | POA: Diagnosis not present

## 2018-04-25 DIAGNOSIS — I25700 Atherosclerosis of coronary artery bypass graft(s), unspecified, with unstable angina pectoris: Secondary | ICD-10-CM | POA: Diagnosis not present

## 2018-04-25 DIAGNOSIS — E1122 Type 2 diabetes mellitus with diabetic chronic kidney disease: Secondary | ICD-10-CM | POA: Diagnosis not present

## 2018-04-26 ENCOUNTER — Other Ambulatory Visit: Payer: Self-pay | Admitting: Family Medicine

## 2018-04-26 DIAGNOSIS — I25700 Atherosclerosis of coronary artery bypass graft(s), unspecified, with unstable angina pectoris: Secondary | ICD-10-CM | POA: Diagnosis not present

## 2018-04-26 DIAGNOSIS — I5032 Chronic diastolic (congestive) heart failure: Secondary | ICD-10-CM | POA: Diagnosis not present

## 2018-04-26 DIAGNOSIS — E1122 Type 2 diabetes mellitus with diabetic chronic kidney disease: Secondary | ICD-10-CM | POA: Diagnosis not present

## 2018-04-26 DIAGNOSIS — E1121 Type 2 diabetes mellitus with diabetic nephropathy: Secondary | ICD-10-CM | POA: Diagnosis not present

## 2018-04-26 DIAGNOSIS — N183 Chronic kidney disease, stage 3 (moderate): Secondary | ICD-10-CM | POA: Diagnosis not present

## 2018-04-26 DIAGNOSIS — I130 Hypertensive heart and chronic kidney disease with heart failure and stage 1 through stage 4 chronic kidney disease, or unspecified chronic kidney disease: Secondary | ICD-10-CM | POA: Diagnosis not present

## 2018-04-26 NOTE — Telephone Encounter (Signed)
Dr. Darnell Level, is ok to refill #45/1?

## 2018-04-27 DIAGNOSIS — E1122 Type 2 diabetes mellitus with diabetic chronic kidney disease: Secondary | ICD-10-CM | POA: Diagnosis not present

## 2018-04-27 DIAGNOSIS — I5032 Chronic diastolic (congestive) heart failure: Secondary | ICD-10-CM | POA: Diagnosis not present

## 2018-04-27 DIAGNOSIS — N183 Chronic kidney disease, stage 3 (moderate): Secondary | ICD-10-CM | POA: Diagnosis not present

## 2018-04-27 DIAGNOSIS — I130 Hypertensive heart and chronic kidney disease with heart failure and stage 1 through stage 4 chronic kidney disease, or unspecified chronic kidney disease: Secondary | ICD-10-CM | POA: Diagnosis not present

## 2018-04-27 DIAGNOSIS — I25700 Atherosclerosis of coronary artery bypass graft(s), unspecified, with unstable angina pectoris: Secondary | ICD-10-CM | POA: Diagnosis not present

## 2018-04-27 DIAGNOSIS — E1121 Type 2 diabetes mellitus with diabetic nephropathy: Secondary | ICD-10-CM | POA: Diagnosis not present

## 2018-04-28 DIAGNOSIS — I5032 Chronic diastolic (congestive) heart failure: Secondary | ICD-10-CM | POA: Diagnosis not present

## 2018-04-28 DIAGNOSIS — N183 Chronic kidney disease, stage 3 (moderate): Secondary | ICD-10-CM | POA: Diagnosis not present

## 2018-04-28 DIAGNOSIS — E1122 Type 2 diabetes mellitus with diabetic chronic kidney disease: Secondary | ICD-10-CM | POA: Diagnosis not present

## 2018-04-28 DIAGNOSIS — E1121 Type 2 diabetes mellitus with diabetic nephropathy: Secondary | ICD-10-CM | POA: Diagnosis not present

## 2018-04-28 DIAGNOSIS — I25700 Atherosclerosis of coronary artery bypass graft(s), unspecified, with unstable angina pectoris: Secondary | ICD-10-CM | POA: Diagnosis not present

## 2018-04-28 DIAGNOSIS — I130 Hypertensive heart and chronic kidney disease with heart failure and stage 1 through stage 4 chronic kidney disease, or unspecified chronic kidney disease: Secondary | ICD-10-CM | POA: Diagnosis not present

## 2018-04-30 ENCOUNTER — Other Ambulatory Visit: Payer: Self-pay | Admitting: Family Medicine

## 2018-05-01 ENCOUNTER — Other Ambulatory Visit: Payer: Self-pay | Admitting: Family Medicine

## 2018-05-01 DIAGNOSIS — E1122 Type 2 diabetes mellitus with diabetic chronic kidney disease: Secondary | ICD-10-CM | POA: Diagnosis not present

## 2018-05-01 DIAGNOSIS — I25700 Atherosclerosis of coronary artery bypass graft(s), unspecified, with unstable angina pectoris: Secondary | ICD-10-CM | POA: Diagnosis not present

## 2018-05-01 DIAGNOSIS — E1121 Type 2 diabetes mellitus with diabetic nephropathy: Secondary | ICD-10-CM | POA: Diagnosis not present

## 2018-05-01 DIAGNOSIS — I5032 Chronic diastolic (congestive) heart failure: Secondary | ICD-10-CM | POA: Diagnosis not present

## 2018-05-01 DIAGNOSIS — N183 Chronic kidney disease, stage 3 (moderate): Secondary | ICD-10-CM | POA: Diagnosis not present

## 2018-05-01 DIAGNOSIS — I130 Hypertensive heart and chronic kidney disease with heart failure and stage 1 through stage 4 chronic kidney disease, or unspecified chronic kidney disease: Secondary | ICD-10-CM | POA: Diagnosis not present

## 2018-05-02 ENCOUNTER — Telehealth: Payer: Self-pay

## 2018-05-02 DIAGNOSIS — E1121 Type 2 diabetes mellitus with diabetic nephropathy: Secondary | ICD-10-CM | POA: Diagnosis not present

## 2018-05-02 DIAGNOSIS — N183 Chronic kidney disease, stage 3 (moderate): Secondary | ICD-10-CM | POA: Diagnosis not present

## 2018-05-02 DIAGNOSIS — I5032 Chronic diastolic (congestive) heart failure: Secondary | ICD-10-CM | POA: Diagnosis not present

## 2018-05-02 DIAGNOSIS — I130 Hypertensive heart and chronic kidney disease with heart failure and stage 1 through stage 4 chronic kidney disease, or unspecified chronic kidney disease: Secondary | ICD-10-CM | POA: Diagnosis not present

## 2018-05-02 DIAGNOSIS — E1122 Type 2 diabetes mellitus with diabetic chronic kidney disease: Secondary | ICD-10-CM | POA: Diagnosis not present

## 2018-05-02 DIAGNOSIS — I25700 Atherosclerosis of coronary artery bypass graft(s), unspecified, with unstable angina pectoris: Secondary | ICD-10-CM | POA: Diagnosis not present

## 2018-05-02 NOTE — Telephone Encounter (Signed)
Received inbound call from [facility] regarding patient's complaint of itching in neck region. Per [name], there is no rash present. [name] has requested a verbal order for topical cream to relieve itching.   [name] can be reached @ [phone]

## 2018-05-02 NOTE — Telephone Encounter (Addendum)
May try moisturizing cream like aveeno to neck PRN itching.  If ineffective, may try small amouts of topical benadryl  PRN.

## 2018-05-03 DIAGNOSIS — Z7902 Long term (current) use of antithrombotics/antiplatelets: Secondary | ICD-10-CM | POA: Diagnosis not present

## 2018-05-03 DIAGNOSIS — Z9981 Dependence on supplemental oxygen: Secondary | ICD-10-CM | POA: Diagnosis not present

## 2018-05-03 DIAGNOSIS — B379 Candidiasis, unspecified: Secondary | ICD-10-CM | POA: Diagnosis not present

## 2018-05-03 DIAGNOSIS — I25700 Atherosclerosis of coronary artery bypass graft(s), unspecified, with unstable angina pectoris: Secondary | ICD-10-CM | POA: Diagnosis not present

## 2018-05-03 DIAGNOSIS — R32 Unspecified urinary incontinence: Secondary | ICD-10-CM | POA: Diagnosis not present

## 2018-05-03 DIAGNOSIS — Z681 Body mass index (BMI) 19 or less, adult: Secondary | ICD-10-CM | POA: Diagnosis not present

## 2018-05-03 DIAGNOSIS — R296 Repeated falls: Secondary | ICD-10-CM | POA: Diagnosis not present

## 2018-05-03 DIAGNOSIS — I5032 Chronic diastolic (congestive) heart failure: Secondary | ICD-10-CM | POA: Diagnosis not present

## 2018-05-03 DIAGNOSIS — N183 Chronic kidney disease, stage 3 (moderate): Secondary | ICD-10-CM | POA: Diagnosis not present

## 2018-05-03 DIAGNOSIS — N390 Urinary tract infection, site not specified: Secondary | ICD-10-CM | POA: Diagnosis not present

## 2018-05-03 DIAGNOSIS — Z9181 History of falling: Secondary | ICD-10-CM | POA: Diagnosis not present

## 2018-05-03 DIAGNOSIS — Z947 Corneal transplant status: Secondary | ICD-10-CM | POA: Diagnosis not present

## 2018-05-03 DIAGNOSIS — E1122 Type 2 diabetes mellitus with diabetic chronic kidney disease: Secondary | ICD-10-CM | POA: Diagnosis not present

## 2018-05-03 DIAGNOSIS — I130 Hypertensive heart and chronic kidney disease with heart failure and stage 1 through stage 4 chronic kidney disease, or unspecified chronic kidney disease: Secondary | ICD-10-CM | POA: Diagnosis not present

## 2018-05-03 DIAGNOSIS — Z7984 Long term (current) use of oral hypoglycemic drugs: Secondary | ICD-10-CM | POA: Diagnosis not present

## 2018-05-03 DIAGNOSIS — Z7901 Long term (current) use of anticoagulants: Secondary | ICD-10-CM | POA: Diagnosis not present

## 2018-05-03 DIAGNOSIS — E785 Hyperlipidemia, unspecified: Secondary | ICD-10-CM | POA: Diagnosis not present

## 2018-05-03 DIAGNOSIS — J9611 Chronic respiratory failure with hypoxia: Secondary | ICD-10-CM | POA: Diagnosis not present

## 2018-05-03 DIAGNOSIS — Z741 Need for assistance with personal care: Secondary | ICD-10-CM | POA: Diagnosis not present

## 2018-05-03 DIAGNOSIS — E1121 Type 2 diabetes mellitus with diabetic nephropathy: Secondary | ICD-10-CM | POA: Diagnosis not present

## 2018-05-03 NOTE — Telephone Encounter (Signed)
Spoke with [name], of Hospice, relaying Dr. Synthia Innocent message.  Verbalizes understanding.

## 2018-05-04 DIAGNOSIS — E1121 Type 2 diabetes mellitus with diabetic nephropathy: Secondary | ICD-10-CM | POA: Diagnosis not present

## 2018-05-04 DIAGNOSIS — E1122 Type 2 diabetes mellitus with diabetic chronic kidney disease: Secondary | ICD-10-CM | POA: Diagnosis not present

## 2018-05-04 DIAGNOSIS — I5032 Chronic diastolic (congestive) heart failure: Secondary | ICD-10-CM | POA: Diagnosis not present

## 2018-05-04 DIAGNOSIS — N183 Chronic kidney disease, stage 3 (moderate): Secondary | ICD-10-CM | POA: Diagnosis not present

## 2018-05-04 DIAGNOSIS — I25700 Atherosclerosis of coronary artery bypass graft(s), unspecified, with unstable angina pectoris: Secondary | ICD-10-CM | POA: Diagnosis not present

## 2018-05-04 DIAGNOSIS — I130 Hypertensive heart and chronic kidney disease with heart failure and stage 1 through stage 4 chronic kidney disease, or unspecified chronic kidney disease: Secondary | ICD-10-CM | POA: Diagnosis not present

## 2018-05-05 DIAGNOSIS — N183 Chronic kidney disease, stage 3 (moderate): Secondary | ICD-10-CM | POA: Diagnosis not present

## 2018-05-05 DIAGNOSIS — E1121 Type 2 diabetes mellitus with diabetic nephropathy: Secondary | ICD-10-CM | POA: Diagnosis not present

## 2018-05-05 DIAGNOSIS — I130 Hypertensive heart and chronic kidney disease with heart failure and stage 1 through stage 4 chronic kidney disease, or unspecified chronic kidney disease: Secondary | ICD-10-CM | POA: Diagnosis not present

## 2018-05-05 DIAGNOSIS — I5032 Chronic diastolic (congestive) heart failure: Secondary | ICD-10-CM | POA: Diagnosis not present

## 2018-05-05 DIAGNOSIS — I25700 Atherosclerosis of coronary artery bypass graft(s), unspecified, with unstable angina pectoris: Secondary | ICD-10-CM | POA: Diagnosis not present

## 2018-05-05 DIAGNOSIS — E1122 Type 2 diabetes mellitus with diabetic chronic kidney disease: Secondary | ICD-10-CM | POA: Diagnosis not present

## 2018-05-09 ENCOUNTER — Telehealth: Payer: Self-pay | Admitting: Family Medicine

## 2018-05-09 NOTE — Telephone Encounter (Signed)
Placed in Dr. G's box.  

## 2018-05-09 NOTE — Telephone Encounter (Signed)
Agree. Signed and in Lisa's box.

## 2018-05-09 NOTE — Telephone Encounter (Signed)
Order faxed.

## 2018-05-09 NOTE — Telephone Encounter (Signed)
Wanting to know the status of the order that was faxed that needed to be signed for medications refill

## 2018-05-18 ENCOUNTER — Other Ambulatory Visit: Payer: Self-pay | Admitting: Family Medicine

## 2018-05-19 NOTE — Telephone Encounter (Signed)
Electronic refill request Lorazepam Last office visit 12/15/17 Last refill 01/23/18 #30/3

## 2018-05-19 NOTE — Telephone Encounter (Signed)
Eprescribed.

## 2018-05-25 ENCOUNTER — Other Ambulatory Visit: Payer: Self-pay | Admitting: Family Medicine

## 2018-05-25 DIAGNOSIS — I130 Hypertensive heart and chronic kidney disease with heart failure and stage 1 through stage 4 chronic kidney disease, or unspecified chronic kidney disease: Secondary | ICD-10-CM | POA: Diagnosis not present

## 2018-05-25 DIAGNOSIS — E1122 Type 2 diabetes mellitus with diabetic chronic kidney disease: Secondary | ICD-10-CM | POA: Diagnosis not present

## 2018-05-25 DIAGNOSIS — I5032 Chronic diastolic (congestive) heart failure: Secondary | ICD-10-CM | POA: Diagnosis not present

## 2018-05-25 DIAGNOSIS — I25700 Atherosclerosis of coronary artery bypass graft(s), unspecified, with unstable angina pectoris: Secondary | ICD-10-CM | POA: Diagnosis not present

## 2018-05-25 DIAGNOSIS — E1121 Type 2 diabetes mellitus with diabetic nephropathy: Secondary | ICD-10-CM | POA: Diagnosis not present

## 2018-05-25 DIAGNOSIS — N183 Chronic kidney disease, stage 3 (moderate): Secondary | ICD-10-CM | POA: Diagnosis not present

## 2018-05-25 NOTE — Telephone Encounter (Signed)
Name of Medication: Lorazepam Name of Pharmacy: [place] or Written Date and Quantity: 05/11/18, #15 Last Office Visit and Type: 12/15/17, f/u Next Office Visit and Type: 09/05/18, CPE Pt 2 Last Controlled Substance Agreement Date: none Last UDS: none

## 2018-05-25 NOTE — Telephone Encounter (Signed)
Eprescribed.

## 2018-05-29 DIAGNOSIS — N183 Chronic kidney disease, stage 3 (moderate): Secondary | ICD-10-CM | POA: Diagnosis not present

## 2018-05-29 DIAGNOSIS — I130 Hypertensive heart and chronic kidney disease with heart failure and stage 1 through stage 4 chronic kidney disease, or unspecified chronic kidney disease: Secondary | ICD-10-CM | POA: Diagnosis not present

## 2018-05-29 DIAGNOSIS — I25700 Atherosclerosis of coronary artery bypass graft(s), unspecified, with unstable angina pectoris: Secondary | ICD-10-CM | POA: Diagnosis not present

## 2018-05-29 DIAGNOSIS — E1122 Type 2 diabetes mellitus with diabetic chronic kidney disease: Secondary | ICD-10-CM | POA: Diagnosis not present

## 2018-05-29 DIAGNOSIS — I5032 Chronic diastolic (congestive) heart failure: Secondary | ICD-10-CM | POA: Diagnosis not present

## 2018-05-29 DIAGNOSIS — E1121 Type 2 diabetes mellitus with diabetic nephropathy: Secondary | ICD-10-CM | POA: Diagnosis not present

## 2018-05-30 ENCOUNTER — Telehealth: Payer: Self-pay

## 2018-05-30 MED ORDER — CEPHALEXIN 500 MG PO CAPS: 500.0000 mg | ORAL_CAPSULE | Freq: Three times a day (TID) | ORAL | 0 refills | Status: AC

## 2018-05-30 NOTE — Telephone Encounter (Signed)
Stacy with Hospice said on 05/26/18 pt had pinpoint area on rt knee; looked like a zit with clear drainage; area was dressed and today entire rt knee is pink to red; rt knee hurts and also hurts to touch.there appears to be a bruise around the pin point area today.? Pustule.no swelling of knee and no red streaks. No fever, cough,SOB,no travel and no know exposure to covid or flu. Marzetta Board said hospice phones are encrypted and does not know if can do virtual appt or not. Marzetta Board wondered if could start Keflex and continue with dry dressings? Stacy request cb.

## 2018-05-30 NOTE — Telephone Encounter (Signed)
I called and spoke with [name]. She is aware Rx was sent in & to continue the dressings.

## 2018-05-30 NOTE — Telephone Encounter (Signed)
Agree with starting keflex and continued dressings - sent to [facility] pharmacy. Let us know how she does.

## 2018-05-31 NOTE — Telephone Encounter (Signed)
Stacy with hospice said that keflex was not at [facility]. I advised the keflex rx was sent to [facility] in [place]. Marzetta Board said she will go by [facility] and pick up keflex for pt. Marzetta Board said to take off the [facility] and pharmacy in [place] from pts pharmacy list. Done. Nothing further needed.

## 2018-06-02 DIAGNOSIS — Z681 Body mass index (BMI) 19 or less, adult: Secondary | ICD-10-CM | POA: Diagnosis not present

## 2018-06-02 DIAGNOSIS — I25700 Atherosclerosis of coronary artery bypass graft(s), unspecified, with unstable angina pectoris: Secondary | ICD-10-CM | POA: Diagnosis not present

## 2018-06-02 DIAGNOSIS — B379 Candidiasis, unspecified: Secondary | ICD-10-CM | POA: Diagnosis not present

## 2018-06-02 DIAGNOSIS — N390 Urinary tract infection, site not specified: Secondary | ICD-10-CM | POA: Diagnosis not present

## 2018-06-02 DIAGNOSIS — J9611 Chronic respiratory failure with hypoxia: Secondary | ICD-10-CM | POA: Diagnosis not present

## 2018-06-02 DIAGNOSIS — Z947 Corneal transplant status: Secondary | ICD-10-CM | POA: Diagnosis not present

## 2018-06-02 DIAGNOSIS — Z741 Need for assistance with personal care: Secondary | ICD-10-CM | POA: Diagnosis not present

## 2018-06-02 DIAGNOSIS — Z7902 Long term (current) use of antithrombotics/antiplatelets: Secondary | ICD-10-CM | POA: Diagnosis not present

## 2018-06-02 DIAGNOSIS — Z9181 History of falling: Secondary | ICD-10-CM | POA: Diagnosis not present

## 2018-06-02 DIAGNOSIS — I130 Hypertensive heart and chronic kidney disease with heart failure and stage 1 through stage 4 chronic kidney disease, or unspecified chronic kidney disease: Secondary | ICD-10-CM | POA: Diagnosis not present

## 2018-06-02 DIAGNOSIS — Z7901 Long term (current) use of anticoagulants: Secondary | ICD-10-CM | POA: Diagnosis not present

## 2018-06-02 DIAGNOSIS — E1121 Type 2 diabetes mellitus with diabetic nephropathy: Secondary | ICD-10-CM | POA: Diagnosis not present

## 2018-06-02 DIAGNOSIS — I5032 Chronic diastolic (congestive) heart failure: Secondary | ICD-10-CM | POA: Diagnosis not present

## 2018-06-02 DIAGNOSIS — E1122 Type 2 diabetes mellitus with diabetic chronic kidney disease: Secondary | ICD-10-CM | POA: Diagnosis not present

## 2018-06-02 DIAGNOSIS — N183 Chronic kidney disease, stage 3 (moderate): Secondary | ICD-10-CM | POA: Diagnosis not present

## 2018-06-02 DIAGNOSIS — Z9981 Dependence on supplemental oxygen: Secondary | ICD-10-CM | POA: Diagnosis not present

## 2018-06-02 DIAGNOSIS — R32 Unspecified urinary incontinence: Secondary | ICD-10-CM | POA: Diagnosis not present

## 2018-06-02 DIAGNOSIS — E785 Hyperlipidemia, unspecified: Secondary | ICD-10-CM | POA: Diagnosis not present

## 2018-06-02 DIAGNOSIS — Z7984 Long term (current) use of oral hypoglycemic drugs: Secondary | ICD-10-CM | POA: Diagnosis not present

## 2018-06-05 DIAGNOSIS — E1121 Type 2 diabetes mellitus with diabetic nephropathy: Secondary | ICD-10-CM | POA: Diagnosis not present

## 2018-06-05 DIAGNOSIS — I5032 Chronic diastolic (congestive) heart failure: Secondary | ICD-10-CM | POA: Diagnosis not present

## 2018-06-05 DIAGNOSIS — I130 Hypertensive heart and chronic kidney disease with heart failure and stage 1 through stage 4 chronic kidney disease, or unspecified chronic kidney disease: Secondary | ICD-10-CM | POA: Diagnosis not present

## 2018-06-05 DIAGNOSIS — E1122 Type 2 diabetes mellitus with diabetic chronic kidney disease: Secondary | ICD-10-CM | POA: Diagnosis not present

## 2018-06-05 DIAGNOSIS — N183 Chronic kidney disease, stage 3 (moderate): Secondary | ICD-10-CM | POA: Diagnosis not present

## 2018-06-05 DIAGNOSIS — I25700 Atherosclerosis of coronary artery bypass graft(s), unspecified, with unstable angina pectoris: Secondary | ICD-10-CM | POA: Diagnosis not present

## 2018-06-08 DIAGNOSIS — E1122 Type 2 diabetes mellitus with diabetic chronic kidney disease: Secondary | ICD-10-CM | POA: Diagnosis not present

## 2018-06-08 DIAGNOSIS — N183 Chronic kidney disease, stage 3 (moderate): Secondary | ICD-10-CM | POA: Diagnosis not present

## 2018-06-08 DIAGNOSIS — I25700 Atherosclerosis of coronary artery bypass graft(s), unspecified, with unstable angina pectoris: Secondary | ICD-10-CM | POA: Diagnosis not present

## 2018-06-08 DIAGNOSIS — I5032 Chronic diastolic (congestive) heart failure: Secondary | ICD-10-CM | POA: Diagnosis not present

## 2018-06-08 DIAGNOSIS — E1121 Type 2 diabetes mellitus with diabetic nephropathy: Secondary | ICD-10-CM | POA: Diagnosis not present

## 2018-06-08 DIAGNOSIS — I130 Hypertensive heart and chronic kidney disease with heart failure and stage 1 through stage 4 chronic kidney disease, or unspecified chronic kidney disease: Secondary | ICD-10-CM | POA: Diagnosis not present

## 2018-06-12 DIAGNOSIS — I25700 Atherosclerosis of coronary artery bypass graft(s), unspecified, with unstable angina pectoris: Secondary | ICD-10-CM | POA: Diagnosis not present

## 2018-06-12 DIAGNOSIS — N183 Chronic kidney disease, stage 3 (moderate): Secondary | ICD-10-CM | POA: Diagnosis not present

## 2018-06-12 DIAGNOSIS — I5032 Chronic diastolic (congestive) heart failure: Secondary | ICD-10-CM | POA: Diagnosis not present

## 2018-06-12 DIAGNOSIS — E1121 Type 2 diabetes mellitus with diabetic nephropathy: Secondary | ICD-10-CM | POA: Diagnosis not present

## 2018-06-12 DIAGNOSIS — E1122 Type 2 diabetes mellitus with diabetic chronic kidney disease: Secondary | ICD-10-CM | POA: Diagnosis not present

## 2018-06-12 DIAGNOSIS — I130 Hypertensive heart and chronic kidney disease with heart failure and stage 1 through stage 4 chronic kidney disease, or unspecified chronic kidney disease: Secondary | ICD-10-CM | POA: Diagnosis not present

## 2018-06-20 DIAGNOSIS — N183 Chronic kidney disease, stage 3 (moderate): Secondary | ICD-10-CM | POA: Diagnosis not present

## 2018-06-20 DIAGNOSIS — E1121 Type 2 diabetes mellitus with diabetic nephropathy: Secondary | ICD-10-CM | POA: Diagnosis not present

## 2018-06-20 DIAGNOSIS — I25700 Atherosclerosis of coronary artery bypass graft(s), unspecified, with unstable angina pectoris: Secondary | ICD-10-CM | POA: Diagnosis not present

## 2018-06-20 DIAGNOSIS — I130 Hypertensive heart and chronic kidney disease with heart failure and stage 1 through stage 4 chronic kidney disease, or unspecified chronic kidney disease: Secondary | ICD-10-CM | POA: Diagnosis not present

## 2018-06-20 DIAGNOSIS — I5032 Chronic diastolic (congestive) heart failure: Secondary | ICD-10-CM | POA: Diagnosis not present

## 2018-06-20 DIAGNOSIS — E1122 Type 2 diabetes mellitus with diabetic chronic kidney disease: Secondary | ICD-10-CM | POA: Diagnosis not present

## 2018-06-22 ENCOUNTER — Other Ambulatory Visit: Payer: Self-pay | Admitting: Family Medicine

## 2018-06-22 NOTE — Telephone Encounter (Signed)
Name of Medication: Lorazepam Name of Pharmacy: [place] or Written Date and Quantity: 06/08/18, #15 Last Office Visit and Type: 12/15/17, f/u Next Office Visit and Type: 09/05/18, CPE Pt 2 Last Controlled Substance Agreement Date: none Last UDS: none

## 2018-06-22 NOTE — Telephone Encounter (Signed)
Eprescribed.

## 2018-06-27 DIAGNOSIS — I5032 Chronic diastolic (congestive) heart failure: Secondary | ICD-10-CM | POA: Diagnosis not present

## 2018-06-27 DIAGNOSIS — I25700 Atherosclerosis of coronary artery bypass graft(s), unspecified, with unstable angina pectoris: Secondary | ICD-10-CM | POA: Diagnosis not present

## 2018-06-27 DIAGNOSIS — I130 Hypertensive heart and chronic kidney disease with heart failure and stage 1 through stage 4 chronic kidney disease, or unspecified chronic kidney disease: Secondary | ICD-10-CM | POA: Diagnosis not present

## 2018-06-27 DIAGNOSIS — N183 Chronic kidney disease, stage 3 (moderate): Secondary | ICD-10-CM | POA: Diagnosis not present

## 2018-06-27 DIAGNOSIS — E1121 Type 2 diabetes mellitus with diabetic nephropathy: Secondary | ICD-10-CM | POA: Diagnosis not present

## 2018-06-27 DIAGNOSIS — E1122 Type 2 diabetes mellitus with diabetic chronic kidney disease: Secondary | ICD-10-CM | POA: Diagnosis not present

## 2018-06-28 DIAGNOSIS — I5032 Chronic diastolic (congestive) heart failure: Secondary | ICD-10-CM | POA: Diagnosis not present

## 2018-06-28 DIAGNOSIS — E1122 Type 2 diabetes mellitus with diabetic chronic kidney disease: Secondary | ICD-10-CM | POA: Diagnosis not present

## 2018-06-28 DIAGNOSIS — N183 Chronic kidney disease, stage 3 (moderate): Secondary | ICD-10-CM | POA: Diagnosis not present

## 2018-06-28 DIAGNOSIS — I130 Hypertensive heart and chronic kidney disease with heart failure and stage 1 through stage 4 chronic kidney disease, or unspecified chronic kidney disease: Secondary | ICD-10-CM | POA: Diagnosis not present

## 2018-06-28 DIAGNOSIS — E1121 Type 2 diabetes mellitus with diabetic nephropathy: Secondary | ICD-10-CM | POA: Diagnosis not present

## 2018-06-28 DIAGNOSIS — I25700 Atherosclerosis of coronary artery bypass graft(s), unspecified, with unstable angina pectoris: Secondary | ICD-10-CM | POA: Diagnosis not present

## 2018-06-30 DIAGNOSIS — I25700 Atherosclerosis of coronary artery bypass graft(s), unspecified, with unstable angina pectoris: Secondary | ICD-10-CM | POA: Diagnosis not present

## 2018-06-30 DIAGNOSIS — I5032 Chronic diastolic (congestive) heart failure: Secondary | ICD-10-CM | POA: Diagnosis not present

## 2018-06-30 DIAGNOSIS — E1122 Type 2 diabetes mellitus with diabetic chronic kidney disease: Secondary | ICD-10-CM | POA: Diagnosis not present

## 2018-06-30 DIAGNOSIS — I130 Hypertensive heart and chronic kidney disease with heart failure and stage 1 through stage 4 chronic kidney disease, or unspecified chronic kidney disease: Secondary | ICD-10-CM | POA: Diagnosis not present

## 2018-06-30 DIAGNOSIS — E1121 Type 2 diabetes mellitus with diabetic nephropathy: Secondary | ICD-10-CM | POA: Diagnosis not present

## 2018-06-30 DIAGNOSIS — N183 Chronic kidney disease, stage 3 (moderate): Secondary | ICD-10-CM | POA: Diagnosis not present

## 2018-07-03 DIAGNOSIS — R32 Unspecified urinary incontinence: Secondary | ICD-10-CM | POA: Diagnosis not present

## 2018-07-03 DIAGNOSIS — I130 Hypertensive heart and chronic kidney disease with heart failure and stage 1 through stage 4 chronic kidney disease, or unspecified chronic kidney disease: Secondary | ICD-10-CM | POA: Diagnosis not present

## 2018-07-03 DIAGNOSIS — Z7902 Long term (current) use of antithrombotics/antiplatelets: Secondary | ICD-10-CM | POA: Diagnosis not present

## 2018-07-03 DIAGNOSIS — B379 Candidiasis, unspecified: Secondary | ICD-10-CM | POA: Diagnosis not present

## 2018-07-03 DIAGNOSIS — I5032 Chronic diastolic (congestive) heart failure: Secondary | ICD-10-CM | POA: Diagnosis not present

## 2018-07-03 DIAGNOSIS — Z7901 Long term (current) use of anticoagulants: Secondary | ICD-10-CM | POA: Diagnosis not present

## 2018-07-03 DIAGNOSIS — E785 Hyperlipidemia, unspecified: Secondary | ICD-10-CM | POA: Diagnosis not present

## 2018-07-03 DIAGNOSIS — Z9981 Dependence on supplemental oxygen: Secondary | ICD-10-CM | POA: Diagnosis not present

## 2018-07-03 DIAGNOSIS — N390 Urinary tract infection, site not specified: Secondary | ICD-10-CM | POA: Diagnosis not present

## 2018-07-03 DIAGNOSIS — N183 Chronic kidney disease, stage 3 (moderate): Secondary | ICD-10-CM | POA: Diagnosis not present

## 2018-07-03 DIAGNOSIS — E1121 Type 2 diabetes mellitus with diabetic nephropathy: Secondary | ICD-10-CM | POA: Diagnosis not present

## 2018-07-03 DIAGNOSIS — Z7984 Long term (current) use of oral hypoglycemic drugs: Secondary | ICD-10-CM | POA: Diagnosis not present

## 2018-07-03 DIAGNOSIS — Z947 Corneal transplant status: Secondary | ICD-10-CM | POA: Diagnosis not present

## 2018-07-03 DIAGNOSIS — Z681 Body mass index (BMI) 19 or less, adult: Secondary | ICD-10-CM | POA: Diagnosis not present

## 2018-07-03 DIAGNOSIS — J9611 Chronic respiratory failure with hypoxia: Secondary | ICD-10-CM | POA: Diagnosis not present

## 2018-07-03 DIAGNOSIS — Z741 Need for assistance with personal care: Secondary | ICD-10-CM | POA: Diagnosis not present

## 2018-07-03 DIAGNOSIS — E1122 Type 2 diabetes mellitus with diabetic chronic kidney disease: Secondary | ICD-10-CM | POA: Diagnosis not present

## 2018-07-03 DIAGNOSIS — Z9181 History of falling: Secondary | ICD-10-CM | POA: Diagnosis not present

## 2018-07-03 DIAGNOSIS — I25700 Atherosclerosis of coronary artery bypass graft(s), unspecified, with unstable angina pectoris: Secondary | ICD-10-CM | POA: Diagnosis not present

## 2018-07-04 DIAGNOSIS — E1121 Type 2 diabetes mellitus with diabetic nephropathy: Secondary | ICD-10-CM | POA: Diagnosis not present

## 2018-07-04 DIAGNOSIS — I130 Hypertensive heart and chronic kidney disease with heart failure and stage 1 through stage 4 chronic kidney disease, or unspecified chronic kidney disease: Secondary | ICD-10-CM | POA: Diagnosis not present

## 2018-07-04 DIAGNOSIS — N183 Chronic kidney disease, stage 3 (moderate): Secondary | ICD-10-CM | POA: Diagnosis not present

## 2018-07-04 DIAGNOSIS — E1122 Type 2 diabetes mellitus with diabetic chronic kidney disease: Secondary | ICD-10-CM | POA: Diagnosis not present

## 2018-07-04 DIAGNOSIS — I5032 Chronic diastolic (congestive) heart failure: Secondary | ICD-10-CM | POA: Diagnosis not present

## 2018-07-04 DIAGNOSIS — I25700 Atherosclerosis of coronary artery bypass graft(s), unspecified, with unstable angina pectoris: Secondary | ICD-10-CM | POA: Diagnosis not present

## 2018-07-07 DIAGNOSIS — I130 Hypertensive heart and chronic kidney disease with heart failure and stage 1 through stage 4 chronic kidney disease, or unspecified chronic kidney disease: Secondary | ICD-10-CM | POA: Diagnosis not present

## 2018-07-07 DIAGNOSIS — E1121 Type 2 diabetes mellitus with diabetic nephropathy: Secondary | ICD-10-CM | POA: Diagnosis not present

## 2018-07-07 DIAGNOSIS — N183 Chronic kidney disease, stage 3 (moderate): Secondary | ICD-10-CM | POA: Diagnosis not present

## 2018-07-07 DIAGNOSIS — I25700 Atherosclerosis of coronary artery bypass graft(s), unspecified, with unstable angina pectoris: Secondary | ICD-10-CM | POA: Diagnosis not present

## 2018-07-07 DIAGNOSIS — E1122 Type 2 diabetes mellitus with diabetic chronic kidney disease: Secondary | ICD-10-CM | POA: Diagnosis not present

## 2018-07-07 DIAGNOSIS — I5032 Chronic diastolic (congestive) heart failure: Secondary | ICD-10-CM | POA: Diagnosis not present

## 2018-07-08 DIAGNOSIS — I25700 Atherosclerosis of coronary artery bypass graft(s), unspecified, with unstable angina pectoris: Secondary | ICD-10-CM | POA: Diagnosis not present

## 2018-07-08 DIAGNOSIS — N183 Chronic kidney disease, stage 3 (moderate): Secondary | ICD-10-CM | POA: Diagnosis not present

## 2018-07-08 DIAGNOSIS — I130 Hypertensive heart and chronic kidney disease with heart failure and stage 1 through stage 4 chronic kidney disease, or unspecified chronic kidney disease: Secondary | ICD-10-CM | POA: Diagnosis not present

## 2018-07-08 DIAGNOSIS — E1122 Type 2 diabetes mellitus with diabetic chronic kidney disease: Secondary | ICD-10-CM | POA: Diagnosis not present

## 2018-07-08 DIAGNOSIS — E1121 Type 2 diabetes mellitus with diabetic nephropathy: Secondary | ICD-10-CM | POA: Diagnosis not present

## 2018-07-08 DIAGNOSIS — I5032 Chronic diastolic (congestive) heart failure: Secondary | ICD-10-CM | POA: Diagnosis not present

## 2018-07-10 DIAGNOSIS — E1121 Type 2 diabetes mellitus with diabetic nephropathy: Secondary | ICD-10-CM | POA: Diagnosis not present

## 2018-07-10 DIAGNOSIS — I5032 Chronic diastolic (congestive) heart failure: Secondary | ICD-10-CM | POA: Diagnosis not present

## 2018-07-10 DIAGNOSIS — E1122 Type 2 diabetes mellitus with diabetic chronic kidney disease: Secondary | ICD-10-CM | POA: Diagnosis not present

## 2018-07-10 DIAGNOSIS — I25700 Atherosclerosis of coronary artery bypass graft(s), unspecified, with unstable angina pectoris: Secondary | ICD-10-CM | POA: Diagnosis not present

## 2018-07-10 DIAGNOSIS — N183 Chronic kidney disease, stage 3 (moderate): Secondary | ICD-10-CM | POA: Diagnosis not present

## 2018-07-10 DIAGNOSIS — I130 Hypertensive heart and chronic kidney disease with heart failure and stage 1 through stage 4 chronic kidney disease, or unspecified chronic kidney disease: Secondary | ICD-10-CM | POA: Diagnosis not present

## 2018-07-11 DIAGNOSIS — I5032 Chronic diastolic (congestive) heart failure: Secondary | ICD-10-CM | POA: Diagnosis not present

## 2018-07-11 DIAGNOSIS — I130 Hypertensive heart and chronic kidney disease with heart failure and stage 1 through stage 4 chronic kidney disease, or unspecified chronic kidney disease: Secondary | ICD-10-CM | POA: Diagnosis not present

## 2018-07-11 DIAGNOSIS — E1121 Type 2 diabetes mellitus with diabetic nephropathy: Secondary | ICD-10-CM | POA: Diagnosis not present

## 2018-07-11 DIAGNOSIS — E1122 Type 2 diabetes mellitus with diabetic chronic kidney disease: Secondary | ICD-10-CM | POA: Diagnosis not present

## 2018-07-11 DIAGNOSIS — N183 Chronic kidney disease, stage 3 (moderate): Secondary | ICD-10-CM | POA: Diagnosis not present

## 2018-07-11 DIAGNOSIS — I25700 Atherosclerosis of coronary artery bypass graft(s), unspecified, with unstable angina pectoris: Secondary | ICD-10-CM | POA: Diagnosis not present

## 2018-07-12 ENCOUNTER — Telehealth: Payer: Self-pay | Admitting: *Deleted

## 2018-07-12 DIAGNOSIS — E1122 Type 2 diabetes mellitus with diabetic chronic kidney disease: Secondary | ICD-10-CM | POA: Diagnosis not present

## 2018-07-12 DIAGNOSIS — I5032 Chronic diastolic (congestive) heart failure: Secondary | ICD-10-CM | POA: Diagnosis not present

## 2018-07-12 DIAGNOSIS — I130 Hypertensive heart and chronic kidney disease with heart failure and stage 1 through stage 4 chronic kidney disease, or unspecified chronic kidney disease: Secondary | ICD-10-CM | POA: Diagnosis not present

## 2018-07-12 DIAGNOSIS — E1121 Type 2 diabetes mellitus with diabetic nephropathy: Secondary | ICD-10-CM | POA: Diagnosis not present

## 2018-07-12 DIAGNOSIS — I25700 Atherosclerosis of coronary artery bypass graft(s), unspecified, with unstable angina pectoris: Secondary | ICD-10-CM | POA: Diagnosis not present

## 2018-07-12 DIAGNOSIS — N183 Chronic kidney disease, stage 3 (moderate): Secondary | ICD-10-CM | POA: Diagnosis not present

## 2018-07-12 NOTE — Telephone Encounter (Signed)
Christine Blanchard [facility] Nurse called wanting to know if they can hold patient's Plavix and Aspirin for 3 days?  Patient is having some pain and is needing to have an oral procedure done to have some teeth removed. Please advise,

## 2018-07-13 ENCOUNTER — Telehealth: Payer: Self-pay

## 2018-07-13 NOTE — Telephone Encounter (Signed)
Stacy, Hospice Nurse, is asking if Dr Darnell Level could call her at his convenience about the pt and this upcoming dental procedure. [phone]

## 2018-07-13 NOTE — Telephone Encounter (Signed)
Spoke to Stacy. 

## 2018-07-13 NOTE — Telephone Encounter (Signed)
She has extensive cardiac history, wouldn't recommend holding aspirin. If needed, could hold plavix for 3 days prior to procedure and restart as soon as able.

## 2018-07-13 NOTE — Telephone Encounter (Signed)
Opened in error

## 2018-07-13 NOTE — Telephone Encounter (Signed)
Spoke w/ Marzetta Board. Pt having recurrent dental infections, dentist recommends extractions. Will hold plavix for 3 days, continue aspirin.  Dentist to consider tranexamic acid oral rinse.

## 2018-07-14 DIAGNOSIS — I130 Hypertensive heart and chronic kidney disease with heart failure and stage 1 through stage 4 chronic kidney disease, or unspecified chronic kidney disease: Secondary | ICD-10-CM | POA: Diagnosis not present

## 2018-07-14 DIAGNOSIS — I5032 Chronic diastolic (congestive) heart failure: Secondary | ICD-10-CM | POA: Diagnosis not present

## 2018-07-14 DIAGNOSIS — N183 Chronic kidney disease, stage 3 (moderate): Secondary | ICD-10-CM | POA: Diagnosis not present

## 2018-07-14 DIAGNOSIS — E1122 Type 2 diabetes mellitus with diabetic chronic kidney disease: Secondary | ICD-10-CM | POA: Diagnosis not present

## 2018-07-14 DIAGNOSIS — E1121 Type 2 diabetes mellitus with diabetic nephropathy: Secondary | ICD-10-CM | POA: Diagnosis not present

## 2018-07-14 DIAGNOSIS — I25700 Atherosclerosis of coronary artery bypass graft(s), unspecified, with unstable angina pectoris: Secondary | ICD-10-CM | POA: Diagnosis not present

## 2018-07-17 DIAGNOSIS — I5032 Chronic diastolic (congestive) heart failure: Secondary | ICD-10-CM | POA: Diagnosis not present

## 2018-07-17 DIAGNOSIS — I130 Hypertensive heart and chronic kidney disease with heart failure and stage 1 through stage 4 chronic kidney disease, or unspecified chronic kidney disease: Secondary | ICD-10-CM | POA: Diagnosis not present

## 2018-07-17 DIAGNOSIS — E1122 Type 2 diabetes mellitus with diabetic chronic kidney disease: Secondary | ICD-10-CM | POA: Diagnosis not present

## 2018-07-17 DIAGNOSIS — N183 Chronic kidney disease, stage 3 (moderate): Secondary | ICD-10-CM | POA: Diagnosis not present

## 2018-07-17 DIAGNOSIS — I25700 Atherosclerosis of coronary artery bypass graft(s), unspecified, with unstable angina pectoris: Secondary | ICD-10-CM | POA: Diagnosis not present

## 2018-07-17 DIAGNOSIS — E1121 Type 2 diabetes mellitus with diabetic nephropathy: Secondary | ICD-10-CM | POA: Diagnosis not present

## 2018-07-18 DIAGNOSIS — N183 Chronic kidney disease, stage 3 (moderate): Secondary | ICD-10-CM | POA: Diagnosis not present

## 2018-07-18 DIAGNOSIS — I5032 Chronic diastolic (congestive) heart failure: Secondary | ICD-10-CM | POA: Diagnosis not present

## 2018-07-18 DIAGNOSIS — I130 Hypertensive heart and chronic kidney disease with heart failure and stage 1 through stage 4 chronic kidney disease, or unspecified chronic kidney disease: Secondary | ICD-10-CM | POA: Diagnosis not present

## 2018-07-18 DIAGNOSIS — E1121 Type 2 diabetes mellitus with diabetic nephropathy: Secondary | ICD-10-CM | POA: Diagnosis not present

## 2018-07-18 DIAGNOSIS — I25700 Atherosclerosis of coronary artery bypass graft(s), unspecified, with unstable angina pectoris: Secondary | ICD-10-CM | POA: Diagnosis not present

## 2018-07-18 DIAGNOSIS — E1122 Type 2 diabetes mellitus with diabetic chronic kidney disease: Secondary | ICD-10-CM | POA: Diagnosis not present

## 2018-07-19 DIAGNOSIS — E1121 Type 2 diabetes mellitus with diabetic nephropathy: Secondary | ICD-10-CM | POA: Diagnosis not present

## 2018-07-19 DIAGNOSIS — N183 Chronic kidney disease, stage 3 (moderate): Secondary | ICD-10-CM | POA: Diagnosis not present

## 2018-07-19 DIAGNOSIS — I25700 Atherosclerosis of coronary artery bypass graft(s), unspecified, with unstable angina pectoris: Secondary | ICD-10-CM | POA: Diagnosis not present

## 2018-07-19 DIAGNOSIS — E1122 Type 2 diabetes mellitus with diabetic chronic kidney disease: Secondary | ICD-10-CM | POA: Diagnosis not present

## 2018-07-19 DIAGNOSIS — I130 Hypertensive heart and chronic kidney disease with heart failure and stage 1 through stage 4 chronic kidney disease, or unspecified chronic kidney disease: Secondary | ICD-10-CM | POA: Diagnosis not present

## 2018-07-19 DIAGNOSIS — I5032 Chronic diastolic (congestive) heart failure: Secondary | ICD-10-CM | POA: Diagnosis not present

## 2018-07-20 ENCOUNTER — Other Ambulatory Visit: Payer: Self-pay | Admitting: Family Medicine

## 2018-07-20 DIAGNOSIS — I130 Hypertensive heart and chronic kidney disease with heart failure and stage 1 through stage 4 chronic kidney disease, or unspecified chronic kidney disease: Secondary | ICD-10-CM | POA: Diagnosis not present

## 2018-07-20 DIAGNOSIS — E1122 Type 2 diabetes mellitus with diabetic chronic kidney disease: Secondary | ICD-10-CM | POA: Diagnosis not present

## 2018-07-20 DIAGNOSIS — E1121 Type 2 diabetes mellitus with diabetic nephropathy: Secondary | ICD-10-CM | POA: Diagnosis not present

## 2018-07-20 DIAGNOSIS — I5032 Chronic diastolic (congestive) heart failure: Secondary | ICD-10-CM | POA: Diagnosis not present

## 2018-07-20 DIAGNOSIS — I25700 Atherosclerosis of coronary artery bypass graft(s), unspecified, with unstable angina pectoris: Secondary | ICD-10-CM | POA: Diagnosis not present

## 2018-07-20 DIAGNOSIS — N183 Chronic kidney disease, stage 3 (moderate): Secondary | ICD-10-CM | POA: Diagnosis not present

## 2018-07-20 NOTE — Telephone Encounter (Signed)
Name of Medication: Lorazepam Name of Pharmacy: [place] or Written Date and Quantity: 07/06/18, #15 Last Office Visit and Type: 12/15/17, f/u Next Office Visit and Type: 09/05/18, CPE Pt 2 Last Controlled Substance Agreement Date: none Last UDS: none

## 2018-07-21 DIAGNOSIS — I25700 Atherosclerosis of coronary artery bypass graft(s), unspecified, with unstable angina pectoris: Secondary | ICD-10-CM | POA: Diagnosis not present

## 2018-07-21 DIAGNOSIS — I130 Hypertensive heart and chronic kidney disease with heart failure and stage 1 through stage 4 chronic kidney disease, or unspecified chronic kidney disease: Secondary | ICD-10-CM | POA: Diagnosis not present

## 2018-07-21 DIAGNOSIS — E1122 Type 2 diabetes mellitus with diabetic chronic kidney disease: Secondary | ICD-10-CM | POA: Diagnosis not present

## 2018-07-21 DIAGNOSIS — N183 Chronic kidney disease, stage 3 (moderate): Secondary | ICD-10-CM | POA: Diagnosis not present

## 2018-07-21 DIAGNOSIS — E1121 Type 2 diabetes mellitus with diabetic nephropathy: Secondary | ICD-10-CM | POA: Diagnosis not present

## 2018-07-21 DIAGNOSIS — I5032 Chronic diastolic (congestive) heart failure: Secondary | ICD-10-CM | POA: Diagnosis not present

## 2018-07-21 NOTE — Telephone Encounter (Signed)
Eprescribed.

## 2018-07-24 DIAGNOSIS — N183 Chronic kidney disease, stage 3 (moderate): Secondary | ICD-10-CM | POA: Diagnosis not present

## 2018-07-24 DIAGNOSIS — I25700 Atherosclerosis of coronary artery bypass graft(s), unspecified, with unstable angina pectoris: Secondary | ICD-10-CM | POA: Diagnosis not present

## 2018-07-24 DIAGNOSIS — E1121 Type 2 diabetes mellitus with diabetic nephropathy: Secondary | ICD-10-CM | POA: Diagnosis not present

## 2018-07-24 DIAGNOSIS — I130 Hypertensive heart and chronic kidney disease with heart failure and stage 1 through stage 4 chronic kidney disease, or unspecified chronic kidney disease: Secondary | ICD-10-CM | POA: Diagnosis not present

## 2018-07-24 DIAGNOSIS — E1122 Type 2 diabetes mellitus with diabetic chronic kidney disease: Secondary | ICD-10-CM | POA: Diagnosis not present

## 2018-07-24 DIAGNOSIS — I5032 Chronic diastolic (congestive) heart failure: Secondary | ICD-10-CM | POA: Diagnosis not present

## 2018-07-25 DIAGNOSIS — E1121 Type 2 diabetes mellitus with diabetic nephropathy: Secondary | ICD-10-CM | POA: Diagnosis not present

## 2018-07-25 DIAGNOSIS — N183 Chronic kidney disease, stage 3 (moderate): Secondary | ICD-10-CM | POA: Diagnosis not present

## 2018-07-25 DIAGNOSIS — I25700 Atherosclerosis of coronary artery bypass graft(s), unspecified, with unstable angina pectoris: Secondary | ICD-10-CM | POA: Diagnosis not present

## 2018-07-25 DIAGNOSIS — E1122 Type 2 diabetes mellitus with diabetic chronic kidney disease: Secondary | ICD-10-CM | POA: Diagnosis not present

## 2018-07-25 DIAGNOSIS — I5032 Chronic diastolic (congestive) heart failure: Secondary | ICD-10-CM | POA: Diagnosis not present

## 2018-07-25 DIAGNOSIS — I130 Hypertensive heart and chronic kidney disease with heart failure and stage 1 through stage 4 chronic kidney disease, or unspecified chronic kidney disease: Secondary | ICD-10-CM | POA: Diagnosis not present

## 2018-07-28 ENCOUNTER — Other Ambulatory Visit: Payer: Self-pay | Admitting: Family Medicine

## 2018-07-28 DIAGNOSIS — E1121 Type 2 diabetes mellitus with diabetic nephropathy: Secondary | ICD-10-CM | POA: Diagnosis not present

## 2018-07-28 DIAGNOSIS — E1122 Type 2 diabetes mellitus with diabetic chronic kidney disease: Secondary | ICD-10-CM | POA: Diagnosis not present

## 2018-07-28 DIAGNOSIS — I25700 Atherosclerosis of coronary artery bypass graft(s), unspecified, with unstable angina pectoris: Secondary | ICD-10-CM | POA: Diagnosis not present

## 2018-07-28 DIAGNOSIS — N183 Chronic kidney disease, stage 3 (moderate): Secondary | ICD-10-CM | POA: Diagnosis not present

## 2018-07-28 DIAGNOSIS — I130 Hypertensive heart and chronic kidney disease with heart failure and stage 1 through stage 4 chronic kidney disease, or unspecified chronic kidney disease: Secondary | ICD-10-CM | POA: Diagnosis not present

## 2018-07-28 DIAGNOSIS — I5032 Chronic diastolic (congestive) heart failure: Secondary | ICD-10-CM | POA: Diagnosis not present

## 2018-08-01 DIAGNOSIS — N183 Chronic kidney disease, stage 3 (moderate): Secondary | ICD-10-CM | POA: Diagnosis not present

## 2018-08-01 DIAGNOSIS — E1121 Type 2 diabetes mellitus with diabetic nephropathy: Secondary | ICD-10-CM | POA: Diagnosis not present

## 2018-08-01 DIAGNOSIS — E1122 Type 2 diabetes mellitus with diabetic chronic kidney disease: Secondary | ICD-10-CM | POA: Diagnosis not present

## 2018-08-01 DIAGNOSIS — I130 Hypertensive heart and chronic kidney disease with heart failure and stage 1 through stage 4 chronic kidney disease, or unspecified chronic kidney disease: Secondary | ICD-10-CM | POA: Diagnosis not present

## 2018-08-01 DIAGNOSIS — I25700 Atherosclerosis of coronary artery bypass graft(s), unspecified, with unstable angina pectoris: Secondary | ICD-10-CM | POA: Diagnosis not present

## 2018-08-01 DIAGNOSIS — I5032 Chronic diastolic (congestive) heart failure: Secondary | ICD-10-CM | POA: Diagnosis not present

## 2018-08-02 DIAGNOSIS — Z741 Need for assistance with personal care: Secondary | ICD-10-CM | POA: Diagnosis not present

## 2018-08-02 DIAGNOSIS — Z9981 Dependence on supplemental oxygen: Secondary | ICD-10-CM | POA: Diagnosis not present

## 2018-08-02 DIAGNOSIS — E1121 Type 2 diabetes mellitus with diabetic nephropathy: Secondary | ICD-10-CM | POA: Diagnosis not present

## 2018-08-02 DIAGNOSIS — K047 Periapical abscess without sinus: Secondary | ICD-10-CM | POA: Diagnosis not present

## 2018-08-02 DIAGNOSIS — Z7901 Long term (current) use of anticoagulants: Secondary | ICD-10-CM | POA: Diagnosis not present

## 2018-08-02 DIAGNOSIS — Z9181 History of falling: Secondary | ICD-10-CM | POA: Diagnosis not present

## 2018-08-02 DIAGNOSIS — R32 Unspecified urinary incontinence: Secondary | ICD-10-CM | POA: Diagnosis not present

## 2018-08-02 DIAGNOSIS — N183 Chronic kidney disease, stage 3 (moderate): Secondary | ICD-10-CM | POA: Diagnosis not present

## 2018-08-02 DIAGNOSIS — L089 Local infection of the skin and subcutaneous tissue, unspecified: Secondary | ICD-10-CM | POA: Diagnosis not present

## 2018-08-02 DIAGNOSIS — Z7902 Long term (current) use of antithrombotics/antiplatelets: Secondary | ICD-10-CM | POA: Diagnosis not present

## 2018-08-02 DIAGNOSIS — S81011D Laceration without foreign body, right knee, subsequent encounter: Secondary | ICD-10-CM | POA: Diagnosis not present

## 2018-08-02 DIAGNOSIS — J9611 Chronic respiratory failure with hypoxia: Secondary | ICD-10-CM | POA: Diagnosis not present

## 2018-08-02 DIAGNOSIS — I25700 Atherosclerosis of coronary artery bypass graft(s), unspecified, with unstable angina pectoris: Secondary | ICD-10-CM | POA: Diagnosis not present

## 2018-08-02 DIAGNOSIS — Z87440 Personal history of urinary (tract) infections: Secondary | ICD-10-CM | POA: Diagnosis not present

## 2018-08-02 DIAGNOSIS — Z7984 Long term (current) use of oral hypoglycemic drugs: Secondary | ICD-10-CM | POA: Diagnosis not present

## 2018-08-02 DIAGNOSIS — Z947 Corneal transplant status: Secondary | ICD-10-CM | POA: Diagnosis not present

## 2018-08-02 DIAGNOSIS — I5032 Chronic diastolic (congestive) heart failure: Secondary | ICD-10-CM | POA: Diagnosis not present

## 2018-08-02 DIAGNOSIS — Z681 Body mass index (BMI) 19 or less, adult: Secondary | ICD-10-CM | POA: Diagnosis not present

## 2018-08-02 DIAGNOSIS — E1122 Type 2 diabetes mellitus with diabetic chronic kidney disease: Secondary | ICD-10-CM | POA: Diagnosis not present

## 2018-08-02 DIAGNOSIS — K0381 Cracked tooth: Secondary | ICD-10-CM | POA: Diagnosis not present

## 2018-08-02 DIAGNOSIS — E785 Hyperlipidemia, unspecified: Secondary | ICD-10-CM | POA: Diagnosis not present

## 2018-08-02 DIAGNOSIS — I130 Hypertensive heart and chronic kidney disease with heart failure and stage 1 through stage 4 chronic kidney disease, or unspecified chronic kidney disease: Secondary | ICD-10-CM | POA: Diagnosis not present

## 2018-08-04 DIAGNOSIS — I130 Hypertensive heart and chronic kidney disease with heart failure and stage 1 through stage 4 chronic kidney disease, or unspecified chronic kidney disease: Secondary | ICD-10-CM | POA: Diagnosis not present

## 2018-08-04 DIAGNOSIS — N183 Chronic kidney disease, stage 3 (moderate): Secondary | ICD-10-CM | POA: Diagnosis not present

## 2018-08-04 DIAGNOSIS — E1121 Type 2 diabetes mellitus with diabetic nephropathy: Secondary | ICD-10-CM | POA: Diagnosis not present

## 2018-08-04 DIAGNOSIS — I25700 Atherosclerosis of coronary artery bypass graft(s), unspecified, with unstable angina pectoris: Secondary | ICD-10-CM | POA: Diagnosis not present

## 2018-08-04 DIAGNOSIS — E1122 Type 2 diabetes mellitus with diabetic chronic kidney disease: Secondary | ICD-10-CM | POA: Diagnosis not present

## 2018-08-04 DIAGNOSIS — I5032 Chronic diastolic (congestive) heart failure: Secondary | ICD-10-CM | POA: Diagnosis not present

## 2018-08-07 DIAGNOSIS — I25700 Atherosclerosis of coronary artery bypass graft(s), unspecified, with unstable angina pectoris: Secondary | ICD-10-CM | POA: Diagnosis not present

## 2018-08-07 DIAGNOSIS — E1121 Type 2 diabetes mellitus with diabetic nephropathy: Secondary | ICD-10-CM | POA: Diagnosis not present

## 2018-08-07 DIAGNOSIS — E1122 Type 2 diabetes mellitus with diabetic chronic kidney disease: Secondary | ICD-10-CM | POA: Diagnosis not present

## 2018-08-07 DIAGNOSIS — I5032 Chronic diastolic (congestive) heart failure: Secondary | ICD-10-CM | POA: Diagnosis not present

## 2018-08-07 DIAGNOSIS — N183 Chronic kidney disease, stage 3 (moderate): Secondary | ICD-10-CM | POA: Diagnosis not present

## 2018-08-07 DIAGNOSIS — I130 Hypertensive heart and chronic kidney disease with heart failure and stage 1 through stage 4 chronic kidney disease, or unspecified chronic kidney disease: Secondary | ICD-10-CM | POA: Diagnosis not present

## 2018-08-08 DIAGNOSIS — I5032 Chronic diastolic (congestive) heart failure: Secondary | ICD-10-CM | POA: Diagnosis not present

## 2018-08-08 DIAGNOSIS — E1121 Type 2 diabetes mellitus with diabetic nephropathy: Secondary | ICD-10-CM | POA: Diagnosis not present

## 2018-08-08 DIAGNOSIS — I130 Hypertensive heart and chronic kidney disease with heart failure and stage 1 through stage 4 chronic kidney disease, or unspecified chronic kidney disease: Secondary | ICD-10-CM | POA: Diagnosis not present

## 2018-08-08 DIAGNOSIS — N183 Chronic kidney disease, stage 3 (moderate): Secondary | ICD-10-CM | POA: Diagnosis not present

## 2018-08-08 DIAGNOSIS — I25700 Atherosclerosis of coronary artery bypass graft(s), unspecified, with unstable angina pectoris: Secondary | ICD-10-CM | POA: Diagnosis not present

## 2018-08-08 DIAGNOSIS — E1122 Type 2 diabetes mellitus with diabetic chronic kidney disease: Secondary | ICD-10-CM | POA: Diagnosis not present

## 2018-08-09 DIAGNOSIS — I5032 Chronic diastolic (congestive) heart failure: Secondary | ICD-10-CM | POA: Diagnosis not present

## 2018-08-09 DIAGNOSIS — E1122 Type 2 diabetes mellitus with diabetic chronic kidney disease: Secondary | ICD-10-CM | POA: Diagnosis not present

## 2018-08-09 DIAGNOSIS — N183 Chronic kidney disease, stage 3 (moderate): Secondary | ICD-10-CM | POA: Diagnosis not present

## 2018-08-09 DIAGNOSIS — I25700 Atherosclerosis of coronary artery bypass graft(s), unspecified, with unstable angina pectoris: Secondary | ICD-10-CM | POA: Diagnosis not present

## 2018-08-09 DIAGNOSIS — E1121 Type 2 diabetes mellitus with diabetic nephropathy: Secondary | ICD-10-CM | POA: Diagnosis not present

## 2018-08-09 DIAGNOSIS — I130 Hypertensive heart and chronic kidney disease with heart failure and stage 1 through stage 4 chronic kidney disease, or unspecified chronic kidney disease: Secondary | ICD-10-CM | POA: Diagnosis not present

## 2018-08-11 DIAGNOSIS — N183 Chronic kidney disease, stage 3 (moderate): Secondary | ICD-10-CM | POA: Diagnosis not present

## 2018-08-11 DIAGNOSIS — E1121 Type 2 diabetes mellitus with diabetic nephropathy: Secondary | ICD-10-CM | POA: Diagnosis not present

## 2018-08-11 DIAGNOSIS — I25700 Atherosclerosis of coronary artery bypass graft(s), unspecified, with unstable angina pectoris: Secondary | ICD-10-CM | POA: Diagnosis not present

## 2018-08-11 DIAGNOSIS — I130 Hypertensive heart and chronic kidney disease with heart failure and stage 1 through stage 4 chronic kidney disease, or unspecified chronic kidney disease: Secondary | ICD-10-CM | POA: Diagnosis not present

## 2018-08-11 DIAGNOSIS — I5032 Chronic diastolic (congestive) heart failure: Secondary | ICD-10-CM | POA: Diagnosis not present

## 2018-08-11 DIAGNOSIS — E1122 Type 2 diabetes mellitus with diabetic chronic kidney disease: Secondary | ICD-10-CM | POA: Diagnosis not present

## 2018-08-15 DIAGNOSIS — E1122 Type 2 diabetes mellitus with diabetic chronic kidney disease: Secondary | ICD-10-CM | POA: Diagnosis not present

## 2018-08-15 DIAGNOSIS — E1121 Type 2 diabetes mellitus with diabetic nephropathy: Secondary | ICD-10-CM | POA: Diagnosis not present

## 2018-08-15 DIAGNOSIS — N183 Chronic kidney disease, stage 3 (moderate): Secondary | ICD-10-CM | POA: Diagnosis not present

## 2018-08-15 DIAGNOSIS — I25700 Atherosclerosis of coronary artery bypass graft(s), unspecified, with unstable angina pectoris: Secondary | ICD-10-CM | POA: Diagnosis not present

## 2018-08-15 DIAGNOSIS — I130 Hypertensive heart and chronic kidney disease with heart failure and stage 1 through stage 4 chronic kidney disease, or unspecified chronic kidney disease: Secondary | ICD-10-CM | POA: Diagnosis not present

## 2018-08-15 DIAGNOSIS — I5032 Chronic diastolic (congestive) heart failure: Secondary | ICD-10-CM | POA: Diagnosis not present

## 2018-08-16 DIAGNOSIS — I5032 Chronic diastolic (congestive) heart failure: Secondary | ICD-10-CM | POA: Diagnosis not present

## 2018-08-16 DIAGNOSIS — N183 Chronic kidney disease, stage 3 (moderate): Secondary | ICD-10-CM | POA: Diagnosis not present

## 2018-08-16 DIAGNOSIS — E1122 Type 2 diabetes mellitus with diabetic chronic kidney disease: Secondary | ICD-10-CM | POA: Diagnosis not present

## 2018-08-16 DIAGNOSIS — I25700 Atherosclerosis of coronary artery bypass graft(s), unspecified, with unstable angina pectoris: Secondary | ICD-10-CM | POA: Diagnosis not present

## 2018-08-16 DIAGNOSIS — I130 Hypertensive heart and chronic kidney disease with heart failure and stage 1 through stage 4 chronic kidney disease, or unspecified chronic kidney disease: Secondary | ICD-10-CM | POA: Diagnosis not present

## 2018-08-16 DIAGNOSIS — E1121 Type 2 diabetes mellitus with diabetic nephropathy: Secondary | ICD-10-CM | POA: Diagnosis not present

## 2018-08-17 ENCOUNTER — Other Ambulatory Visit: Payer: Self-pay | Admitting: Family Medicine

## 2018-08-18 DIAGNOSIS — I130 Hypertensive heart and chronic kidney disease with heart failure and stage 1 through stage 4 chronic kidney disease, or unspecified chronic kidney disease: Secondary | ICD-10-CM | POA: Diagnosis not present

## 2018-08-18 DIAGNOSIS — I25700 Atherosclerosis of coronary artery bypass graft(s), unspecified, with unstable angina pectoris: Secondary | ICD-10-CM | POA: Diagnosis not present

## 2018-08-18 DIAGNOSIS — N183 Chronic kidney disease, stage 3 (moderate): Secondary | ICD-10-CM | POA: Diagnosis not present

## 2018-08-18 DIAGNOSIS — U071 COVID-19: Secondary | ICD-10-CM | POA: Diagnosis not present

## 2018-08-18 DIAGNOSIS — E1121 Type 2 diabetes mellitus with diabetic nephropathy: Secondary | ICD-10-CM | POA: Diagnosis not present

## 2018-08-18 DIAGNOSIS — E1122 Type 2 diabetes mellitus with diabetic chronic kidney disease: Secondary | ICD-10-CM | POA: Diagnosis not present

## 2018-08-18 DIAGNOSIS — I5032 Chronic diastolic (congestive) heart failure: Secondary | ICD-10-CM | POA: Diagnosis not present

## 2018-08-22 DIAGNOSIS — I5032 Chronic diastolic (congestive) heart failure: Secondary | ICD-10-CM | POA: Diagnosis not present

## 2018-08-22 DIAGNOSIS — E1121 Type 2 diabetes mellitus with diabetic nephropathy: Secondary | ICD-10-CM | POA: Diagnosis not present

## 2018-08-22 DIAGNOSIS — N183 Chronic kidney disease, stage 3 (moderate): Secondary | ICD-10-CM | POA: Diagnosis not present

## 2018-08-22 DIAGNOSIS — E1122 Type 2 diabetes mellitus with diabetic chronic kidney disease: Secondary | ICD-10-CM | POA: Diagnosis not present

## 2018-08-22 DIAGNOSIS — I25700 Atherosclerosis of coronary artery bypass graft(s), unspecified, with unstable angina pectoris: Secondary | ICD-10-CM | POA: Diagnosis not present

## 2018-08-22 DIAGNOSIS — I130 Hypertensive heart and chronic kidney disease with heart failure and stage 1 through stage 4 chronic kidney disease, or unspecified chronic kidney disease: Secondary | ICD-10-CM | POA: Diagnosis not present

## 2018-08-25 DIAGNOSIS — I5032 Chronic diastolic (congestive) heart failure: Secondary | ICD-10-CM | POA: Diagnosis not present

## 2018-08-25 DIAGNOSIS — N183 Chronic kidney disease, stage 3 (moderate): Secondary | ICD-10-CM | POA: Diagnosis not present

## 2018-08-25 DIAGNOSIS — E1122 Type 2 diabetes mellitus with diabetic chronic kidney disease: Secondary | ICD-10-CM | POA: Diagnosis not present

## 2018-08-25 DIAGNOSIS — E1121 Type 2 diabetes mellitus with diabetic nephropathy: Secondary | ICD-10-CM | POA: Diagnosis not present

## 2018-08-25 DIAGNOSIS — I130 Hypertensive heart and chronic kidney disease with heart failure and stage 1 through stage 4 chronic kidney disease, or unspecified chronic kidney disease: Secondary | ICD-10-CM | POA: Diagnosis not present

## 2018-08-25 DIAGNOSIS — I25700 Atherosclerosis of coronary artery bypass graft(s), unspecified, with unstable angina pectoris: Secondary | ICD-10-CM | POA: Diagnosis not present

## 2018-08-29 DIAGNOSIS — E1121 Type 2 diabetes mellitus with diabetic nephropathy: Secondary | ICD-10-CM | POA: Diagnosis not present

## 2018-08-29 DIAGNOSIS — E1122 Type 2 diabetes mellitus with diabetic chronic kidney disease: Secondary | ICD-10-CM | POA: Diagnosis not present

## 2018-08-29 DIAGNOSIS — I5032 Chronic diastolic (congestive) heart failure: Secondary | ICD-10-CM | POA: Diagnosis not present

## 2018-08-29 DIAGNOSIS — N183 Chronic kidney disease, stage 3 (moderate): Secondary | ICD-10-CM | POA: Diagnosis not present

## 2018-08-29 DIAGNOSIS — I25700 Atherosclerosis of coronary artery bypass graft(s), unspecified, with unstable angina pectoris: Secondary | ICD-10-CM | POA: Diagnosis not present

## 2018-08-29 DIAGNOSIS — I130 Hypertensive heart and chronic kidney disease with heart failure and stage 1 through stage 4 chronic kidney disease, or unspecified chronic kidney disease: Secondary | ICD-10-CM | POA: Diagnosis not present

## 2018-09-01 DIAGNOSIS — N183 Chronic kidney disease, stage 3 (moderate): Secondary | ICD-10-CM | POA: Diagnosis not present

## 2018-09-01 DIAGNOSIS — E1122 Type 2 diabetes mellitus with diabetic chronic kidney disease: Secondary | ICD-10-CM | POA: Diagnosis not present

## 2018-09-01 DIAGNOSIS — I25700 Atherosclerosis of coronary artery bypass graft(s), unspecified, with unstable angina pectoris: Secondary | ICD-10-CM | POA: Diagnosis not present

## 2018-09-01 DIAGNOSIS — I5032 Chronic diastolic (congestive) heart failure: Secondary | ICD-10-CM | POA: Diagnosis not present

## 2018-09-01 DIAGNOSIS — I130 Hypertensive heart and chronic kidney disease with heart failure and stage 1 through stage 4 chronic kidney disease, or unspecified chronic kidney disease: Secondary | ICD-10-CM | POA: Diagnosis not present

## 2018-09-01 DIAGNOSIS — E1121 Type 2 diabetes mellitus with diabetic nephropathy: Secondary | ICD-10-CM | POA: Diagnosis not present

## 2018-09-02 DIAGNOSIS — S81011D Laceration without foreign body, right knee, subsequent encounter: Secondary | ICD-10-CM | POA: Diagnosis not present

## 2018-09-02 DIAGNOSIS — Z9981 Dependence on supplemental oxygen: Secondary | ICD-10-CM | POA: Diagnosis not present

## 2018-09-02 DIAGNOSIS — E1121 Type 2 diabetes mellitus with diabetic nephropathy: Secondary | ICD-10-CM | POA: Diagnosis not present

## 2018-09-02 DIAGNOSIS — Z681 Body mass index (BMI) 19 or less, adult: Secondary | ICD-10-CM | POA: Diagnosis not present

## 2018-09-02 DIAGNOSIS — Z947 Corneal transplant status: Secondary | ICD-10-CM | POA: Diagnosis not present

## 2018-09-02 DIAGNOSIS — I130 Hypertensive heart and chronic kidney disease with heart failure and stage 1 through stage 4 chronic kidney disease, or unspecified chronic kidney disease: Secondary | ICD-10-CM | POA: Diagnosis not present

## 2018-09-02 DIAGNOSIS — J9611 Chronic respiratory failure with hypoxia: Secondary | ICD-10-CM | POA: Diagnosis not present

## 2018-09-02 DIAGNOSIS — I25700 Atherosclerosis of coronary artery bypass graft(s), unspecified, with unstable angina pectoris: Secondary | ICD-10-CM | POA: Diagnosis not present

## 2018-09-02 DIAGNOSIS — Z741 Need for assistance with personal care: Secondary | ICD-10-CM | POA: Diagnosis not present

## 2018-09-02 DIAGNOSIS — Z7902 Long term (current) use of antithrombotics/antiplatelets: Secondary | ICD-10-CM | POA: Diagnosis not present

## 2018-09-02 DIAGNOSIS — E1122 Type 2 diabetes mellitus with diabetic chronic kidney disease: Secondary | ICD-10-CM | POA: Diagnosis not present

## 2018-09-02 DIAGNOSIS — Z9181 History of falling: Secondary | ICD-10-CM | POA: Diagnosis not present

## 2018-09-02 DIAGNOSIS — I5032 Chronic diastolic (congestive) heart failure: Secondary | ICD-10-CM | POA: Diagnosis not present

## 2018-09-02 DIAGNOSIS — Z87440 Personal history of urinary (tract) infections: Secondary | ICD-10-CM | POA: Diagnosis not present

## 2018-09-02 DIAGNOSIS — K0381 Cracked tooth: Secondary | ICD-10-CM | POA: Diagnosis not present

## 2018-09-02 DIAGNOSIS — K047 Periapical abscess without sinus: Secondary | ICD-10-CM | POA: Diagnosis not present

## 2018-09-02 DIAGNOSIS — E785 Hyperlipidemia, unspecified: Secondary | ICD-10-CM | POA: Diagnosis not present

## 2018-09-02 DIAGNOSIS — N183 Chronic kidney disease, stage 3 (moderate): Secondary | ICD-10-CM | POA: Diagnosis not present

## 2018-09-02 DIAGNOSIS — L089 Local infection of the skin and subcutaneous tissue, unspecified: Secondary | ICD-10-CM | POA: Diagnosis not present

## 2018-09-02 DIAGNOSIS — R32 Unspecified urinary incontinence: Secondary | ICD-10-CM | POA: Diagnosis not present

## 2018-09-02 DIAGNOSIS — Z7901 Long term (current) use of anticoagulants: Secondary | ICD-10-CM | POA: Diagnosis not present

## 2018-09-02 DIAGNOSIS — Z7984 Long term (current) use of oral hypoglycemic drugs: Secondary | ICD-10-CM | POA: Diagnosis not present

## 2018-09-04 ENCOUNTER — Other Ambulatory Visit: Payer: Self-pay | Admitting: Family Medicine

## 2018-09-04 NOTE — Telephone Encounter (Signed)
Name of Medication: Tramadol Name of Pharmacy: [place] or Written Date and Quantity: 08/02/18, #60 Last Office Visit and Type: 12/15/17, f/u Next Office Visit and Type: none Last Controlled Substance Agreement Date:  none Last UDS: none

## 2018-09-04 NOTE — Telephone Encounter (Signed)
Eprescribed.

## 2018-09-05 ENCOUNTER — Encounter: Payer: TRICARE For Life (TFL) | Admitting: Family Medicine

## 2018-09-05 ENCOUNTER — Ambulatory Visit: Payer: Medicare Other

## 2018-09-05 DIAGNOSIS — N183 Chronic kidney disease, stage 3 (moderate): Secondary | ICD-10-CM | POA: Diagnosis not present

## 2018-09-05 DIAGNOSIS — E1121 Type 2 diabetes mellitus with diabetic nephropathy: Secondary | ICD-10-CM | POA: Diagnosis not present

## 2018-09-05 DIAGNOSIS — I5032 Chronic diastolic (congestive) heart failure: Secondary | ICD-10-CM | POA: Diagnosis not present

## 2018-09-05 DIAGNOSIS — I25700 Atherosclerosis of coronary artery bypass graft(s), unspecified, with unstable angina pectoris: Secondary | ICD-10-CM | POA: Diagnosis not present

## 2018-09-05 DIAGNOSIS — E1122 Type 2 diabetes mellitus with diabetic chronic kidney disease: Secondary | ICD-10-CM | POA: Diagnosis not present

## 2018-09-05 DIAGNOSIS — I130 Hypertensive heart and chronic kidney disease with heart failure and stage 1 through stage 4 chronic kidney disease, or unspecified chronic kidney disease: Secondary | ICD-10-CM | POA: Diagnosis not present

## 2018-09-05 DIAGNOSIS — U071 COVID-19: Secondary | ICD-10-CM | POA: Diagnosis not present

## 2018-09-06 DIAGNOSIS — I25700 Atherosclerosis of coronary artery bypass graft(s), unspecified, with unstable angina pectoris: Secondary | ICD-10-CM | POA: Diagnosis not present

## 2018-09-06 DIAGNOSIS — N183 Chronic kidney disease, stage 3 (moderate): Secondary | ICD-10-CM | POA: Diagnosis not present

## 2018-09-06 DIAGNOSIS — I130 Hypertensive heart and chronic kidney disease with heart failure and stage 1 through stage 4 chronic kidney disease, or unspecified chronic kidney disease: Secondary | ICD-10-CM | POA: Diagnosis not present

## 2018-09-06 DIAGNOSIS — I5032 Chronic diastolic (congestive) heart failure: Secondary | ICD-10-CM | POA: Diagnosis not present

## 2018-09-06 DIAGNOSIS — E1121 Type 2 diabetes mellitus with diabetic nephropathy: Secondary | ICD-10-CM | POA: Diagnosis not present

## 2018-09-06 DIAGNOSIS — E1122 Type 2 diabetes mellitus with diabetic chronic kidney disease: Secondary | ICD-10-CM | POA: Diagnosis not present

## 2018-09-07 ENCOUNTER — Other Ambulatory Visit: Payer: Self-pay | Admitting: Family Medicine

## 2018-09-07 NOTE — Telephone Encounter (Signed)
Name of Medication: Lorazepam  Name of Pharmacy: [address] or Written Date and Quantity: 08/10/18, #30 Last Office Visit and Type: 12/15/17, f/u Next Office Visit and Type: none Last Controlled Substance Agreement Date: none Last UDS: none

## 2018-09-08 DIAGNOSIS — E1121 Type 2 diabetes mellitus with diabetic nephropathy: Secondary | ICD-10-CM | POA: Diagnosis not present

## 2018-09-08 DIAGNOSIS — N183 Chronic kidney disease, stage 3 (moderate): Secondary | ICD-10-CM | POA: Diagnosis not present

## 2018-09-08 DIAGNOSIS — I25700 Atherosclerosis of coronary artery bypass graft(s), unspecified, with unstable angina pectoris: Secondary | ICD-10-CM | POA: Diagnosis not present

## 2018-09-08 DIAGNOSIS — E1122 Type 2 diabetes mellitus with diabetic chronic kidney disease: Secondary | ICD-10-CM | POA: Diagnosis not present

## 2018-09-08 DIAGNOSIS — I130 Hypertensive heart and chronic kidney disease with heart failure and stage 1 through stage 4 chronic kidney disease, or unspecified chronic kidney disease: Secondary | ICD-10-CM | POA: Diagnosis not present

## 2018-09-08 DIAGNOSIS — I5032 Chronic diastolic (congestive) heart failure: Secondary | ICD-10-CM | POA: Diagnosis not present

## 2018-09-08 NOTE — Telephone Encounter (Signed)
Eprescribed.

## 2018-09-08 NOTE — Telephone Encounter (Signed)
Stacy with [facility]  Left v/m that pt needs refill on both ativan prescriptions because pt is almost out of med. I spoke with Tammy at [facility] and they did receive refill for lorazepam 0.5 mg that Dr Darnell Level sent today. And pt is not due for lorazepam 1 mg until 09/16/18. Stacy voiced understanding.

## 2018-09-08 NOTE — Telephone Encounter (Signed)
[facility] called to check on request. Pt will be out of this med tonight. [phone]

## 2018-09-12 DIAGNOSIS — E1121 Type 2 diabetes mellitus with diabetic nephropathy: Secondary | ICD-10-CM | POA: Diagnosis not present

## 2018-09-12 DIAGNOSIS — I5032 Chronic diastolic (congestive) heart failure: Secondary | ICD-10-CM | POA: Diagnosis not present

## 2018-09-12 DIAGNOSIS — E1122 Type 2 diabetes mellitus with diabetic chronic kidney disease: Secondary | ICD-10-CM | POA: Diagnosis not present

## 2018-09-12 DIAGNOSIS — I25700 Atherosclerosis of coronary artery bypass graft(s), unspecified, with unstable angina pectoris: Secondary | ICD-10-CM | POA: Diagnosis not present

## 2018-09-12 DIAGNOSIS — I130 Hypertensive heart and chronic kidney disease with heart failure and stage 1 through stage 4 chronic kidney disease, or unspecified chronic kidney disease: Secondary | ICD-10-CM | POA: Diagnosis not present

## 2018-09-12 DIAGNOSIS — N183 Chronic kidney disease, stage 3 (moderate): Secondary | ICD-10-CM | POA: Diagnosis not present

## 2018-09-14 ENCOUNTER — Other Ambulatory Visit: Payer: Self-pay | Admitting: Family Medicine

## 2018-09-14 ENCOUNTER — Encounter: Payer: Self-pay | Admitting: Family Medicine

## 2018-09-14 DIAGNOSIS — I25700 Atherosclerosis of coronary artery bypass graft(s), unspecified, with unstable angina pectoris: Secondary | ICD-10-CM | POA: Diagnosis not present

## 2018-09-14 DIAGNOSIS — E1121 Type 2 diabetes mellitus with diabetic nephropathy: Secondary | ICD-10-CM | POA: Diagnosis not present

## 2018-09-14 DIAGNOSIS — E1122 Type 2 diabetes mellitus with diabetic chronic kidney disease: Secondary | ICD-10-CM | POA: Diagnosis not present

## 2018-09-14 DIAGNOSIS — I5032 Chronic diastolic (congestive) heart failure: Secondary | ICD-10-CM | POA: Diagnosis not present

## 2018-09-14 DIAGNOSIS — N183 Chronic kidney disease, stage 3 (moderate): Secondary | ICD-10-CM | POA: Diagnosis not present

## 2018-09-14 DIAGNOSIS — I130 Hypertensive heart and chronic kidney disease with heart failure and stage 1 through stage 4 chronic kidney disease, or unspecified chronic kidney disease: Secondary | ICD-10-CM | POA: Diagnosis not present

## 2018-09-14 NOTE — Telephone Encounter (Signed)
Name of Medication: Lorazepam 1 mg Name of Pharmacy: [place] or Written Date and Quantity: 08/17/18, #30 Last Office Visit and Type: 12/15/17, f/u Next Office Visit and Type: none Last Controlled Substance Agreement Date: none Last UDS: none

## 2018-09-14 NOTE — Telephone Encounter (Signed)
Eprescribed.

## 2018-09-15 DIAGNOSIS — I5032 Chronic diastolic (congestive) heart failure: Secondary | ICD-10-CM | POA: Diagnosis not present

## 2018-09-15 DIAGNOSIS — I130 Hypertensive heart and chronic kidney disease with heart failure and stage 1 through stage 4 chronic kidney disease, or unspecified chronic kidney disease: Secondary | ICD-10-CM | POA: Diagnosis not present

## 2018-09-15 DIAGNOSIS — N183 Chronic kidney disease, stage 3 (moderate): Secondary | ICD-10-CM | POA: Diagnosis not present

## 2018-09-15 DIAGNOSIS — E1121 Type 2 diabetes mellitus with diabetic nephropathy: Secondary | ICD-10-CM | POA: Diagnosis not present

## 2018-09-15 DIAGNOSIS — E1122 Type 2 diabetes mellitus with diabetic chronic kidney disease: Secondary | ICD-10-CM | POA: Diagnosis not present

## 2018-09-15 DIAGNOSIS — I25700 Atherosclerosis of coronary artery bypass graft(s), unspecified, with unstable angina pectoris: Secondary | ICD-10-CM | POA: Diagnosis not present

## 2018-09-19 ENCOUNTER — Encounter: Payer: TRICARE For Life (TFL) | Admitting: Family Medicine

## 2018-09-19 DIAGNOSIS — I5032 Chronic diastolic (congestive) heart failure: Secondary | ICD-10-CM | POA: Diagnosis not present

## 2018-09-19 DIAGNOSIS — N183 Chronic kidney disease, stage 3 (moderate): Secondary | ICD-10-CM | POA: Diagnosis not present

## 2018-09-19 DIAGNOSIS — E1122 Type 2 diabetes mellitus with diabetic chronic kidney disease: Secondary | ICD-10-CM | POA: Diagnosis not present

## 2018-09-19 DIAGNOSIS — I25700 Atherosclerosis of coronary artery bypass graft(s), unspecified, with unstable angina pectoris: Secondary | ICD-10-CM | POA: Diagnosis not present

## 2018-09-19 DIAGNOSIS — U071 COVID-19: Secondary | ICD-10-CM | POA: Diagnosis not present

## 2018-09-19 DIAGNOSIS — I130 Hypertensive heart and chronic kidney disease with heart failure and stage 1 through stage 4 chronic kidney disease, or unspecified chronic kidney disease: Secondary | ICD-10-CM | POA: Diagnosis not present

## 2018-09-19 DIAGNOSIS — E1121 Type 2 diabetes mellitus with diabetic nephropathy: Secondary | ICD-10-CM | POA: Diagnosis not present

## 2018-09-20 DIAGNOSIS — I130 Hypertensive heart and chronic kidney disease with heart failure and stage 1 through stage 4 chronic kidney disease, or unspecified chronic kidney disease: Secondary | ICD-10-CM | POA: Diagnosis not present

## 2018-09-20 DIAGNOSIS — I25700 Atherosclerosis of coronary artery bypass graft(s), unspecified, with unstable angina pectoris: Secondary | ICD-10-CM | POA: Diagnosis not present

## 2018-09-20 DIAGNOSIS — E1121 Type 2 diabetes mellitus with diabetic nephropathy: Secondary | ICD-10-CM | POA: Diagnosis not present

## 2018-09-20 DIAGNOSIS — E1122 Type 2 diabetes mellitus with diabetic chronic kidney disease: Secondary | ICD-10-CM | POA: Diagnosis not present

## 2018-09-20 DIAGNOSIS — N183 Chronic kidney disease, stage 3 (moderate): Secondary | ICD-10-CM | POA: Diagnosis not present

## 2018-09-20 DIAGNOSIS — I5032 Chronic diastolic (congestive) heart failure: Secondary | ICD-10-CM | POA: Diagnosis not present

## 2018-09-22 DIAGNOSIS — I25700 Atherosclerosis of coronary artery bypass graft(s), unspecified, with unstable angina pectoris: Secondary | ICD-10-CM | POA: Diagnosis not present

## 2018-09-22 DIAGNOSIS — E1122 Type 2 diabetes mellitus with diabetic chronic kidney disease: Secondary | ICD-10-CM | POA: Diagnosis not present

## 2018-09-22 DIAGNOSIS — I130 Hypertensive heart and chronic kidney disease with heart failure and stage 1 through stage 4 chronic kidney disease, or unspecified chronic kidney disease: Secondary | ICD-10-CM | POA: Diagnosis not present

## 2018-09-22 DIAGNOSIS — N183 Chronic kidney disease, stage 3 (moderate): Secondary | ICD-10-CM | POA: Diagnosis not present

## 2018-09-22 DIAGNOSIS — E1121 Type 2 diabetes mellitus with diabetic nephropathy: Secondary | ICD-10-CM | POA: Diagnosis not present

## 2018-09-22 DIAGNOSIS — I5032 Chronic diastolic (congestive) heart failure: Secondary | ICD-10-CM | POA: Diagnosis not present

## 2018-09-26 DIAGNOSIS — I130 Hypertensive heart and chronic kidney disease with heart failure and stage 1 through stage 4 chronic kidney disease, or unspecified chronic kidney disease: Secondary | ICD-10-CM | POA: Diagnosis not present

## 2018-09-26 DIAGNOSIS — E1122 Type 2 diabetes mellitus with diabetic chronic kidney disease: Secondary | ICD-10-CM | POA: Diagnosis not present

## 2018-09-26 DIAGNOSIS — N183 Chronic kidney disease, stage 3 (moderate): Secondary | ICD-10-CM | POA: Diagnosis not present

## 2018-09-26 DIAGNOSIS — I5032 Chronic diastolic (congestive) heart failure: Secondary | ICD-10-CM | POA: Diagnosis not present

## 2018-09-26 DIAGNOSIS — I25700 Atherosclerosis of coronary artery bypass graft(s), unspecified, with unstable angina pectoris: Secondary | ICD-10-CM | POA: Diagnosis not present

## 2018-09-26 DIAGNOSIS — E1121 Type 2 diabetes mellitus with diabetic nephropathy: Secondary | ICD-10-CM | POA: Diagnosis not present

## 2018-09-27 ENCOUNTER — Telehealth: Payer: Self-pay | Admitting: Family Medicine

## 2018-09-27 DIAGNOSIS — I25700 Atherosclerosis of coronary artery bypass graft(s), unspecified, with unstable angina pectoris: Secondary | ICD-10-CM | POA: Diagnosis not present

## 2018-09-27 DIAGNOSIS — E1121 Type 2 diabetes mellitus with diabetic nephropathy: Secondary | ICD-10-CM | POA: Diagnosis not present

## 2018-09-27 DIAGNOSIS — N183 Chronic kidney disease, stage 3 (moderate): Secondary | ICD-10-CM | POA: Diagnosis not present

## 2018-09-27 DIAGNOSIS — I5032 Chronic diastolic (congestive) heart failure: Secondary | ICD-10-CM | POA: Diagnosis not present

## 2018-09-27 DIAGNOSIS — I130 Hypertensive heart and chronic kidney disease with heart failure and stage 1 through stage 4 chronic kidney disease, or unspecified chronic kidney disease: Secondary | ICD-10-CM | POA: Diagnosis not present

## 2018-09-27 DIAGNOSIS — E1122 Type 2 diabetes mellitus with diabetic chronic kidney disease: Secondary | ICD-10-CM | POA: Diagnosis not present

## 2018-09-27 MED ORDER — TRAMADOL HCL 50 MG PO TABS: ORAL_TABLET | ORAL | 3 refills | Status: DC

## 2018-09-27 NOTE — Telephone Encounter (Signed)
Received request to increase tramadol dose by [facility] hospice due to increased pain at 3am daily.  Will send in new dose to pharmacy

## 2018-09-29 DIAGNOSIS — N183 Chronic kidney disease, stage 3 (moderate): Secondary | ICD-10-CM | POA: Diagnosis not present

## 2018-09-29 DIAGNOSIS — E1122 Type 2 diabetes mellitus with diabetic chronic kidney disease: Secondary | ICD-10-CM | POA: Diagnosis not present

## 2018-09-29 DIAGNOSIS — I130 Hypertensive heart and chronic kidney disease with heart failure and stage 1 through stage 4 chronic kidney disease, or unspecified chronic kidney disease: Secondary | ICD-10-CM | POA: Diagnosis not present

## 2018-09-29 DIAGNOSIS — I5032 Chronic diastolic (congestive) heart failure: Secondary | ICD-10-CM | POA: Diagnosis not present

## 2018-09-29 DIAGNOSIS — I25700 Atherosclerosis of coronary artery bypass graft(s), unspecified, with unstable angina pectoris: Secondary | ICD-10-CM | POA: Diagnosis not present

## 2018-09-29 DIAGNOSIS — E1121 Type 2 diabetes mellitus with diabetic nephropathy: Secondary | ICD-10-CM | POA: Diagnosis not present

## 2018-10-03 DIAGNOSIS — N183 Chronic kidney disease, stage 3 (moderate): Secondary | ICD-10-CM | POA: Diagnosis not present

## 2018-10-03 DIAGNOSIS — Z7901 Long term (current) use of anticoagulants: Secondary | ICD-10-CM | POA: Diagnosis not present

## 2018-10-03 DIAGNOSIS — Z741 Need for assistance with personal care: Secondary | ICD-10-CM | POA: Diagnosis not present

## 2018-10-03 DIAGNOSIS — E1122 Type 2 diabetes mellitus with diabetic chronic kidney disease: Secondary | ICD-10-CM | POA: Diagnosis not present

## 2018-10-03 DIAGNOSIS — E785 Hyperlipidemia, unspecified: Secondary | ICD-10-CM | POA: Diagnosis not present

## 2018-10-03 DIAGNOSIS — Z681 Body mass index (BMI) 19 or less, adult: Secondary | ICD-10-CM | POA: Diagnosis not present

## 2018-10-03 DIAGNOSIS — I130 Hypertensive heart and chronic kidney disease with heart failure and stage 1 through stage 4 chronic kidney disease, or unspecified chronic kidney disease: Secondary | ICD-10-CM | POA: Diagnosis not present

## 2018-10-03 DIAGNOSIS — Z947 Corneal transplant status: Secondary | ICD-10-CM | POA: Diagnosis not present

## 2018-10-03 DIAGNOSIS — E1121 Type 2 diabetes mellitus with diabetic nephropathy: Secondary | ICD-10-CM | POA: Diagnosis not present

## 2018-10-03 DIAGNOSIS — I25700 Atherosclerosis of coronary artery bypass graft(s), unspecified, with unstable angina pectoris: Secondary | ICD-10-CM | POA: Diagnosis not present

## 2018-10-03 DIAGNOSIS — K0381 Cracked tooth: Secondary | ICD-10-CM | POA: Diagnosis not present

## 2018-10-03 DIAGNOSIS — R32 Unspecified urinary incontinence: Secondary | ICD-10-CM | POA: Diagnosis not present

## 2018-10-03 DIAGNOSIS — J9611 Chronic respiratory failure with hypoxia: Secondary | ICD-10-CM | POA: Diagnosis not present

## 2018-10-03 DIAGNOSIS — Z87440 Personal history of urinary (tract) infections: Secondary | ICD-10-CM | POA: Diagnosis not present

## 2018-10-03 DIAGNOSIS — Z7984 Long term (current) use of oral hypoglycemic drugs: Secondary | ICD-10-CM | POA: Diagnosis not present

## 2018-10-03 DIAGNOSIS — Z7902 Long term (current) use of antithrombotics/antiplatelets: Secondary | ICD-10-CM | POA: Diagnosis not present

## 2018-10-03 DIAGNOSIS — I5032 Chronic diastolic (congestive) heart failure: Secondary | ICD-10-CM | POA: Diagnosis not present

## 2018-10-03 DIAGNOSIS — Z9181 History of falling: Secondary | ICD-10-CM | POA: Diagnosis not present

## 2018-10-03 DIAGNOSIS — Z993 Dependence on wheelchair: Secondary | ICD-10-CM | POA: Diagnosis not present

## 2018-10-03 DIAGNOSIS — S81811D Laceration without foreign body, right lower leg, subsequent encounter: Secondary | ICD-10-CM | POA: Diagnosis not present

## 2018-10-03 DIAGNOSIS — Z9981 Dependence on supplemental oxygen: Secondary | ICD-10-CM | POA: Diagnosis not present

## 2018-10-03 DIAGNOSIS — K047 Periapical abscess without sinus: Secondary | ICD-10-CM | POA: Diagnosis not present

## 2018-10-04 DIAGNOSIS — I25700 Atherosclerosis of coronary artery bypass graft(s), unspecified, with unstable angina pectoris: Secondary | ICD-10-CM | POA: Diagnosis not present

## 2018-10-04 DIAGNOSIS — E1122 Type 2 diabetes mellitus with diabetic chronic kidney disease: Secondary | ICD-10-CM | POA: Diagnosis not present

## 2018-10-04 DIAGNOSIS — I5032 Chronic diastolic (congestive) heart failure: Secondary | ICD-10-CM | POA: Diagnosis not present

## 2018-10-04 DIAGNOSIS — N183 Chronic kidney disease, stage 3 (moderate): Secondary | ICD-10-CM | POA: Diagnosis not present

## 2018-10-04 DIAGNOSIS — I130 Hypertensive heart and chronic kidney disease with heart failure and stage 1 through stage 4 chronic kidney disease, or unspecified chronic kidney disease: Secondary | ICD-10-CM | POA: Diagnosis not present

## 2018-10-04 DIAGNOSIS — E1121 Type 2 diabetes mellitus with diabetic nephropathy: Secondary | ICD-10-CM | POA: Diagnosis not present

## 2018-10-05 DIAGNOSIS — N183 Chronic kidney disease, stage 3 (moderate): Secondary | ICD-10-CM | POA: Diagnosis not present

## 2018-10-05 DIAGNOSIS — E1121 Type 2 diabetes mellitus with diabetic nephropathy: Secondary | ICD-10-CM | POA: Diagnosis not present

## 2018-10-05 DIAGNOSIS — E1122 Type 2 diabetes mellitus with diabetic chronic kidney disease: Secondary | ICD-10-CM | POA: Diagnosis not present

## 2018-10-05 DIAGNOSIS — I25700 Atherosclerosis of coronary artery bypass graft(s), unspecified, with unstable angina pectoris: Secondary | ICD-10-CM | POA: Diagnosis not present

## 2018-10-05 DIAGNOSIS — I130 Hypertensive heart and chronic kidney disease with heart failure and stage 1 through stage 4 chronic kidney disease, or unspecified chronic kidney disease: Secondary | ICD-10-CM | POA: Diagnosis not present

## 2018-10-05 DIAGNOSIS — I5032 Chronic diastolic (congestive) heart failure: Secondary | ICD-10-CM | POA: Diagnosis not present

## 2018-10-06 DIAGNOSIS — N183 Chronic kidney disease, stage 3 (moderate): Secondary | ICD-10-CM | POA: Diagnosis not present

## 2018-10-06 DIAGNOSIS — I130 Hypertensive heart and chronic kidney disease with heart failure and stage 1 through stage 4 chronic kidney disease, or unspecified chronic kidney disease: Secondary | ICD-10-CM | POA: Diagnosis not present

## 2018-10-06 DIAGNOSIS — E1121 Type 2 diabetes mellitus with diabetic nephropathy: Secondary | ICD-10-CM | POA: Diagnosis not present

## 2018-10-06 DIAGNOSIS — E1122 Type 2 diabetes mellitus with diabetic chronic kidney disease: Secondary | ICD-10-CM | POA: Diagnosis not present

## 2018-10-06 DIAGNOSIS — I5032 Chronic diastolic (congestive) heart failure: Secondary | ICD-10-CM | POA: Diagnosis not present

## 2018-10-06 DIAGNOSIS — I25700 Atherosclerosis of coronary artery bypass graft(s), unspecified, with unstable angina pectoris: Secondary | ICD-10-CM | POA: Diagnosis not present

## 2018-10-10 DIAGNOSIS — I5032 Chronic diastolic (congestive) heart failure: Secondary | ICD-10-CM | POA: Diagnosis not present

## 2018-10-10 DIAGNOSIS — I25700 Atherosclerosis of coronary artery bypass graft(s), unspecified, with unstable angina pectoris: Secondary | ICD-10-CM | POA: Diagnosis not present

## 2018-10-10 DIAGNOSIS — N183 Chronic kidney disease, stage 3 (moderate): Secondary | ICD-10-CM | POA: Diagnosis not present

## 2018-10-10 DIAGNOSIS — E1121 Type 2 diabetes mellitus with diabetic nephropathy: Secondary | ICD-10-CM | POA: Diagnosis not present

## 2018-10-10 DIAGNOSIS — E1122 Type 2 diabetes mellitus with diabetic chronic kidney disease: Secondary | ICD-10-CM | POA: Diagnosis not present

## 2018-10-10 DIAGNOSIS — I130 Hypertensive heart and chronic kidney disease with heart failure and stage 1 through stage 4 chronic kidney disease, or unspecified chronic kidney disease: Secondary | ICD-10-CM | POA: Diagnosis not present

## 2018-10-13 DIAGNOSIS — E1122 Type 2 diabetes mellitus with diabetic chronic kidney disease: Secondary | ICD-10-CM | POA: Diagnosis not present

## 2018-10-13 DIAGNOSIS — I25700 Atherosclerosis of coronary artery bypass graft(s), unspecified, with unstable angina pectoris: Secondary | ICD-10-CM | POA: Diagnosis not present

## 2018-10-13 DIAGNOSIS — E1121 Type 2 diabetes mellitus with diabetic nephropathy: Secondary | ICD-10-CM | POA: Diagnosis not present

## 2018-10-13 DIAGNOSIS — N183 Chronic kidney disease, stage 3 (moderate): Secondary | ICD-10-CM | POA: Diagnosis not present

## 2018-10-13 DIAGNOSIS — I5032 Chronic diastolic (congestive) heart failure: Secondary | ICD-10-CM | POA: Diagnosis not present

## 2018-10-13 DIAGNOSIS — I130 Hypertensive heart and chronic kidney disease with heart failure and stage 1 through stage 4 chronic kidney disease, or unspecified chronic kidney disease: Secondary | ICD-10-CM | POA: Diagnosis not present

## 2018-10-17 DIAGNOSIS — I5032 Chronic diastolic (congestive) heart failure: Secondary | ICD-10-CM | POA: Diagnosis not present

## 2018-10-17 DIAGNOSIS — E1121 Type 2 diabetes mellitus with diabetic nephropathy: Secondary | ICD-10-CM | POA: Diagnosis not present

## 2018-10-17 DIAGNOSIS — I25700 Atherosclerosis of coronary artery bypass graft(s), unspecified, with unstable angina pectoris: Secondary | ICD-10-CM | POA: Diagnosis not present

## 2018-10-17 DIAGNOSIS — I130 Hypertensive heart and chronic kidney disease with heart failure and stage 1 through stage 4 chronic kidney disease, or unspecified chronic kidney disease: Secondary | ICD-10-CM | POA: Diagnosis not present

## 2018-10-17 DIAGNOSIS — E1122 Type 2 diabetes mellitus with diabetic chronic kidney disease: Secondary | ICD-10-CM | POA: Diagnosis not present

## 2018-10-17 DIAGNOSIS — N183 Chronic kidney disease, stage 3 (moderate): Secondary | ICD-10-CM | POA: Diagnosis not present

## 2018-10-18 ENCOUNTER — Telehealth: Payer: Self-pay | Admitting: *Deleted

## 2018-10-18 MED ORDER — TRAMADOL HCL 50 MG PO TABS: 50.0000 mg | ORAL_TABLET | Freq: Three times a day (TID) | ORAL | 0 refills | Status: DC

## 2018-10-18 NOTE — Telephone Encounter (Signed)
Sent in

## 2018-10-18 NOTE — Telephone Encounter (Signed)
I believe patient was taking 50mg  at 3pm and 75mg  at [place] to increase tramadol to 50mg  TID.  Where should new Rx be sent to?

## 2018-10-18 NOTE — Addendum Note (Signed)
Addended by: Ria Bush on: 10/18/2018 05:12 PM   Modules accepted: Orders

## 2018-10-18 NOTE — Telephone Encounter (Signed)
Stacey nurse with Hospice left a voicemail stating that the patient is complaining of a lot more pain. Christine Blanchard wants to know if they can add a Tramadol 50 mg every morning?  Christine Blanchard stated that patient is already taking Tramadol 50 mg at 3 pm and at bedtime and would like to continue those doses. Christine Blanchard requested a call back.

## 2018-10-18 NOTE — Telephone Encounter (Signed)
Spoke with [facility] relaying Dr. Synthia Innocent message.  She verbalizes understanding and asks that tramadol rx be sent to [facility].

## 2018-10-19 DIAGNOSIS — N183 Chronic kidney disease, stage 3 (moderate): Secondary | ICD-10-CM | POA: Diagnosis not present

## 2018-10-19 DIAGNOSIS — E1122 Type 2 diabetes mellitus with diabetic chronic kidney disease: Secondary | ICD-10-CM | POA: Diagnosis not present

## 2018-10-19 DIAGNOSIS — I5032 Chronic diastolic (congestive) heart failure: Secondary | ICD-10-CM | POA: Diagnosis not present

## 2018-10-19 DIAGNOSIS — I25700 Atherosclerosis of coronary artery bypass graft(s), unspecified, with unstable angina pectoris: Secondary | ICD-10-CM | POA: Diagnosis not present

## 2018-10-19 DIAGNOSIS — E1121 Type 2 diabetes mellitus with diabetic nephropathy: Secondary | ICD-10-CM | POA: Diagnosis not present

## 2018-10-19 DIAGNOSIS — I130 Hypertensive heart and chronic kidney disease with heart failure and stage 1 through stage 4 chronic kidney disease, or unspecified chronic kidney disease: Secondary | ICD-10-CM | POA: Diagnosis not present

## 2018-10-20 DIAGNOSIS — N183 Chronic kidney disease, stage 3 (moderate): Secondary | ICD-10-CM | POA: Diagnosis not present

## 2018-10-20 DIAGNOSIS — E1122 Type 2 diabetes mellitus with diabetic chronic kidney disease: Secondary | ICD-10-CM | POA: Diagnosis not present

## 2018-10-20 DIAGNOSIS — I130 Hypertensive heart and chronic kidney disease with heart failure and stage 1 through stage 4 chronic kidney disease, or unspecified chronic kidney disease: Secondary | ICD-10-CM | POA: Diagnosis not present

## 2018-10-20 DIAGNOSIS — I25700 Atherosclerosis of coronary artery bypass graft(s), unspecified, with unstable angina pectoris: Secondary | ICD-10-CM | POA: Diagnosis not present

## 2018-10-20 DIAGNOSIS — I5032 Chronic diastolic (congestive) heart failure: Secondary | ICD-10-CM | POA: Diagnosis not present

## 2018-10-20 DIAGNOSIS — E1121 Type 2 diabetes mellitus with diabetic nephropathy: Secondary | ICD-10-CM | POA: Diagnosis not present

## 2018-10-24 DIAGNOSIS — N183 Chronic kidney disease, stage 3 (moderate): Secondary | ICD-10-CM | POA: Diagnosis not present

## 2018-10-24 DIAGNOSIS — E1121 Type 2 diabetes mellitus with diabetic nephropathy: Secondary | ICD-10-CM | POA: Diagnosis not present

## 2018-10-24 DIAGNOSIS — I130 Hypertensive heart and chronic kidney disease with heart failure and stage 1 through stage 4 chronic kidney disease, or unspecified chronic kidney disease: Secondary | ICD-10-CM | POA: Diagnosis not present

## 2018-10-24 DIAGNOSIS — E1122 Type 2 diabetes mellitus with diabetic chronic kidney disease: Secondary | ICD-10-CM | POA: Diagnosis not present

## 2018-10-24 DIAGNOSIS — I5032 Chronic diastolic (congestive) heart failure: Secondary | ICD-10-CM | POA: Diagnosis not present

## 2018-10-24 DIAGNOSIS — I25700 Atherosclerosis of coronary artery bypass graft(s), unspecified, with unstable angina pectoris: Secondary | ICD-10-CM | POA: Diagnosis not present

## 2018-10-25 DIAGNOSIS — I25700 Atherosclerosis of coronary artery bypass graft(s), unspecified, with unstable angina pectoris: Secondary | ICD-10-CM | POA: Diagnosis not present

## 2018-10-25 DIAGNOSIS — E1121 Type 2 diabetes mellitus with diabetic nephropathy: Secondary | ICD-10-CM | POA: Diagnosis not present

## 2018-10-25 DIAGNOSIS — I130 Hypertensive heart and chronic kidney disease with heart failure and stage 1 through stage 4 chronic kidney disease, or unspecified chronic kidney disease: Secondary | ICD-10-CM | POA: Diagnosis not present

## 2018-10-25 DIAGNOSIS — E1122 Type 2 diabetes mellitus with diabetic chronic kidney disease: Secondary | ICD-10-CM | POA: Diagnosis not present

## 2018-10-25 DIAGNOSIS — I5032 Chronic diastolic (congestive) heart failure: Secondary | ICD-10-CM | POA: Diagnosis not present

## 2018-10-25 DIAGNOSIS — N183 Chronic kidney disease, stage 3 (moderate): Secondary | ICD-10-CM | POA: Diagnosis not present

## 2018-10-26 DIAGNOSIS — U071 COVID-19: Secondary | ICD-10-CM | POA: Diagnosis not present

## 2018-10-27 ENCOUNTER — Other Ambulatory Visit: Payer: Self-pay | Admitting: Family Medicine

## 2018-10-27 ENCOUNTER — Telehealth: Payer: Self-pay

## 2018-10-27 DIAGNOSIS — I130 Hypertensive heart and chronic kidney disease with heart failure and stage 1 through stage 4 chronic kidney disease, or unspecified chronic kidney disease: Secondary | ICD-10-CM | POA: Diagnosis not present

## 2018-10-27 DIAGNOSIS — I5032 Chronic diastolic (congestive) heart failure: Secondary | ICD-10-CM | POA: Diagnosis not present

## 2018-10-27 DIAGNOSIS — N183 Chronic kidney disease, stage 3 (moderate): Secondary | ICD-10-CM | POA: Diagnosis not present

## 2018-10-27 DIAGNOSIS — E1121 Type 2 diabetes mellitus with diabetic nephropathy: Secondary | ICD-10-CM | POA: Diagnosis not present

## 2018-10-27 DIAGNOSIS — I25700 Atherosclerosis of coronary artery bypass graft(s), unspecified, with unstable angina pectoris: Secondary | ICD-10-CM | POA: Diagnosis not present

## 2018-10-27 DIAGNOSIS — E1122 Type 2 diabetes mellitus with diabetic chronic kidney disease: Secondary | ICD-10-CM | POA: Diagnosis not present

## 2018-10-27 NOTE — Telephone Encounter (Signed)
Stacy advised. If they need something in written for the change she will call us back.

## 2018-10-27 NOTE — Telephone Encounter (Signed)
Received request for refill tramadol. #90 sent in 10/18/2018. Denied.

## 2018-10-27 NOTE — Telephone Encounter (Signed)
Stacy with Hospice left v/m that pt is presently taking Tramadol 50 mg tid. Marzetta Board wants to know since pt has a lot of early AM pain if pt can have the 8 AM dose of Tramadol changed to 3 AM or 4 AM so when wakes up hopefully will not be in as much pain. Stacy request cb.

## 2018-10-27 NOTE — Telephone Encounter (Signed)
Ok to switch this for first dose at 3-4am.

## 2018-10-30 ENCOUNTER — Other Ambulatory Visit: Payer: Self-pay | Admitting: Family Medicine

## 2018-10-30 NOTE — Telephone Encounter (Signed)
Spoke to Christine Blanchard at the pharmacy and was advised that she did get the refill on 10/18/18. Christine Blanchard stated that since the Hospice nurse had changed the directions she was hoping to get another script to have on file when another refill was needed.

## 2018-10-30 NOTE — Telephone Encounter (Signed)
plz call pharmacy to check on tramadol refill request.  I denied Rx request last Friday as we sent in a 1 month supply to same pharmacy on 10/18/2018.

## 2018-10-31 DIAGNOSIS — E1121 Type 2 diabetes mellitus with diabetic nephropathy: Secondary | ICD-10-CM | POA: Diagnosis not present

## 2018-10-31 DIAGNOSIS — E1122 Type 2 diabetes mellitus with diabetic chronic kidney disease: Secondary | ICD-10-CM | POA: Diagnosis not present

## 2018-10-31 DIAGNOSIS — I25700 Atherosclerosis of coronary artery bypass graft(s), unspecified, with unstable angina pectoris: Secondary | ICD-10-CM | POA: Diagnosis not present

## 2018-10-31 DIAGNOSIS — I5032 Chronic diastolic (congestive) heart failure: Secondary | ICD-10-CM | POA: Diagnosis not present

## 2018-10-31 DIAGNOSIS — I130 Hypertensive heart and chronic kidney disease with heart failure and stage 1 through stage 4 chronic kidney disease, or unspecified chronic kidney disease: Secondary | ICD-10-CM | POA: Diagnosis not present

## 2018-10-31 DIAGNOSIS — N183 Chronic kidney disease, stage 3 (moderate): Secondary | ICD-10-CM | POA: Diagnosis not present

## 2018-11-01 NOTE — Telephone Encounter (Signed)
ERx 

## 2018-11-02 ENCOUNTER — Other Ambulatory Visit: Payer: Self-pay | Admitting: Family Medicine

## 2018-11-02 DIAGNOSIS — S81811D Laceration without foreign body, right lower leg, subsequent encounter: Secondary | ICD-10-CM | POA: Diagnosis not present

## 2018-11-02 DIAGNOSIS — Z993 Dependence on wheelchair: Secondary | ICD-10-CM | POA: Diagnosis not present

## 2018-11-02 DIAGNOSIS — I25700 Atherosclerosis of coronary artery bypass graft(s), unspecified, with unstable angina pectoris: Secondary | ICD-10-CM | POA: Diagnosis not present

## 2018-11-02 DIAGNOSIS — Z87440 Personal history of urinary (tract) infections: Secondary | ICD-10-CM | POA: Diagnosis not present

## 2018-11-02 DIAGNOSIS — Z741 Need for assistance with personal care: Secondary | ICD-10-CM | POA: Diagnosis not present

## 2018-11-02 DIAGNOSIS — Z7902 Long term (current) use of antithrombotics/antiplatelets: Secondary | ICD-10-CM | POA: Diagnosis not present

## 2018-11-02 DIAGNOSIS — Z7901 Long term (current) use of anticoagulants: Secondary | ICD-10-CM | POA: Diagnosis not present

## 2018-11-02 DIAGNOSIS — N183 Chronic kidney disease, stage 3 unspecified: Secondary | ICD-10-CM | POA: Diagnosis not present

## 2018-11-02 DIAGNOSIS — E1121 Type 2 diabetes mellitus with diabetic nephropathy: Secondary | ICD-10-CM | POA: Diagnosis not present

## 2018-11-02 DIAGNOSIS — I130 Hypertensive heart and chronic kidney disease with heart failure and stage 1 through stage 4 chronic kidney disease, or unspecified chronic kidney disease: Secondary | ICD-10-CM | POA: Diagnosis not present

## 2018-11-02 DIAGNOSIS — K0381 Cracked tooth: Secondary | ICD-10-CM | POA: Diagnosis not present

## 2018-11-02 DIAGNOSIS — R32 Unspecified urinary incontinence: Secondary | ICD-10-CM | POA: Diagnosis not present

## 2018-11-02 DIAGNOSIS — K047 Periapical abscess without sinus: Secondary | ICD-10-CM | POA: Diagnosis not present

## 2018-11-02 DIAGNOSIS — Z9181 History of falling: Secondary | ICD-10-CM | POA: Diagnosis not present

## 2018-11-02 DIAGNOSIS — Z7984 Long term (current) use of oral hypoglycemic drugs: Secondary | ICD-10-CM | POA: Diagnosis not present

## 2018-11-02 DIAGNOSIS — J9611 Chronic respiratory failure with hypoxia: Secondary | ICD-10-CM | POA: Diagnosis not present

## 2018-11-02 DIAGNOSIS — Z947 Corneal transplant status: Secondary | ICD-10-CM | POA: Diagnosis not present

## 2018-11-02 DIAGNOSIS — E1122 Type 2 diabetes mellitus with diabetic chronic kidney disease: Secondary | ICD-10-CM | POA: Diagnosis not present

## 2018-11-02 DIAGNOSIS — Z9981 Dependence on supplemental oxygen: Secondary | ICD-10-CM | POA: Diagnosis not present

## 2018-11-02 DIAGNOSIS — Z681 Body mass index (BMI) 19 or less, adult: Secondary | ICD-10-CM | POA: Diagnosis not present

## 2018-11-02 DIAGNOSIS — U071 COVID-19: Secondary | ICD-10-CM | POA: Diagnosis not present

## 2018-11-02 DIAGNOSIS — I5032 Chronic diastolic (congestive) heart failure: Secondary | ICD-10-CM | POA: Diagnosis not present

## 2018-11-02 DIAGNOSIS — E785 Hyperlipidemia, unspecified: Secondary | ICD-10-CM | POA: Diagnosis not present

## 2018-11-02 NOTE — Telephone Encounter (Signed)
Name of Medication: Lorazepam 0.5 mg Name of Pharmacy: [place] or Written Date and Quantity: 10/05/18, #30 Last Office Visit and Type: 12/15/17, f/u Next Office Visit and Type: none Last Controlled Substance Agreement Date: none Last UDS: none

## 2018-11-03 DIAGNOSIS — I25700 Atherosclerosis of coronary artery bypass graft(s), unspecified, with unstable angina pectoris: Secondary | ICD-10-CM | POA: Diagnosis not present

## 2018-11-03 DIAGNOSIS — I130 Hypertensive heart and chronic kidney disease with heart failure and stage 1 through stage 4 chronic kidney disease, or unspecified chronic kidney disease: Secondary | ICD-10-CM | POA: Diagnosis not present

## 2018-11-03 DIAGNOSIS — I5032 Chronic diastolic (congestive) heart failure: Secondary | ICD-10-CM | POA: Diagnosis not present

## 2018-11-03 DIAGNOSIS — N183 Chronic kidney disease, stage 3 unspecified: Secondary | ICD-10-CM | POA: Diagnosis not present

## 2018-11-03 DIAGNOSIS — E1122 Type 2 diabetes mellitus with diabetic chronic kidney disease: Secondary | ICD-10-CM | POA: Diagnosis not present

## 2018-11-03 DIAGNOSIS — E1121 Type 2 diabetes mellitus with diabetic nephropathy: Secondary | ICD-10-CM | POA: Diagnosis not present

## 2018-11-03 NOTE — Telephone Encounter (Signed)
ERx 

## 2018-11-07 DIAGNOSIS — N183 Chronic kidney disease, stage 3 unspecified: Secondary | ICD-10-CM | POA: Diagnosis not present

## 2018-11-07 DIAGNOSIS — I25700 Atherosclerosis of coronary artery bypass graft(s), unspecified, with unstable angina pectoris: Secondary | ICD-10-CM | POA: Diagnosis not present

## 2018-11-07 DIAGNOSIS — E1122 Type 2 diabetes mellitus with diabetic chronic kidney disease: Secondary | ICD-10-CM | POA: Diagnosis not present

## 2018-11-07 DIAGNOSIS — I5032 Chronic diastolic (congestive) heart failure: Secondary | ICD-10-CM | POA: Diagnosis not present

## 2018-11-07 DIAGNOSIS — E1121 Type 2 diabetes mellitus with diabetic nephropathy: Secondary | ICD-10-CM | POA: Diagnosis not present

## 2018-11-07 DIAGNOSIS — I130 Hypertensive heart and chronic kidney disease with heart failure and stage 1 through stage 4 chronic kidney disease, or unspecified chronic kidney disease: Secondary | ICD-10-CM | POA: Diagnosis not present

## 2018-11-09 DIAGNOSIS — E1121 Type 2 diabetes mellitus with diabetic nephropathy: Secondary | ICD-10-CM | POA: Diagnosis not present

## 2018-11-09 DIAGNOSIS — E1122 Type 2 diabetes mellitus with diabetic chronic kidney disease: Secondary | ICD-10-CM | POA: Diagnosis not present

## 2018-11-09 DIAGNOSIS — U071 COVID-19: Secondary | ICD-10-CM | POA: Diagnosis not present

## 2018-11-09 DIAGNOSIS — I130 Hypertensive heart and chronic kidney disease with heart failure and stage 1 through stage 4 chronic kidney disease, or unspecified chronic kidney disease: Secondary | ICD-10-CM | POA: Diagnosis not present

## 2018-11-09 DIAGNOSIS — I25700 Atherosclerosis of coronary artery bypass graft(s), unspecified, with unstable angina pectoris: Secondary | ICD-10-CM | POA: Diagnosis not present

## 2018-11-09 DIAGNOSIS — N183 Chronic kidney disease, stage 3 unspecified: Secondary | ICD-10-CM | POA: Diagnosis not present

## 2018-11-09 DIAGNOSIS — I5032 Chronic diastolic (congestive) heart failure: Secondary | ICD-10-CM | POA: Diagnosis not present

## 2018-11-10 ENCOUNTER — Other Ambulatory Visit: Payer: Self-pay | Admitting: Family Medicine

## 2018-11-10 DIAGNOSIS — E1122 Type 2 diabetes mellitus with diabetic chronic kidney disease: Secondary | ICD-10-CM | POA: Diagnosis not present

## 2018-11-10 DIAGNOSIS — I130 Hypertensive heart and chronic kidney disease with heart failure and stage 1 through stage 4 chronic kidney disease, or unspecified chronic kidney disease: Secondary | ICD-10-CM | POA: Diagnosis not present

## 2018-11-10 DIAGNOSIS — N183 Chronic kidney disease, stage 3 unspecified: Secondary | ICD-10-CM | POA: Diagnosis not present

## 2018-11-10 DIAGNOSIS — I5032 Chronic diastolic (congestive) heart failure: Secondary | ICD-10-CM | POA: Diagnosis not present

## 2018-11-10 DIAGNOSIS — E1121 Type 2 diabetes mellitus with diabetic nephropathy: Secondary | ICD-10-CM | POA: Diagnosis not present

## 2018-11-10 DIAGNOSIS — I25700 Atherosclerosis of coronary artery bypass graft(s), unspecified, with unstable angina pectoris: Secondary | ICD-10-CM | POA: Diagnosis not present

## 2018-11-13 NOTE — Telephone Encounter (Signed)
Last office visit 12/15/2017 for 3 month follow up.  AVS states to return in 3 months for MWV.  No future appointments.  Refill?

## 2018-11-14 DIAGNOSIS — I5032 Chronic diastolic (congestive) heart failure: Secondary | ICD-10-CM | POA: Diagnosis not present

## 2018-11-14 DIAGNOSIS — I130 Hypertensive heart and chronic kidney disease with heart failure and stage 1 through stage 4 chronic kidney disease, or unspecified chronic kidney disease: Secondary | ICD-10-CM | POA: Diagnosis not present

## 2018-11-14 DIAGNOSIS — I25700 Atherosclerosis of coronary artery bypass graft(s), unspecified, with unstable angina pectoris: Secondary | ICD-10-CM | POA: Diagnosis not present

## 2018-11-14 DIAGNOSIS — E1121 Type 2 diabetes mellitus with diabetic nephropathy: Secondary | ICD-10-CM | POA: Diagnosis not present

## 2018-11-14 DIAGNOSIS — E1122 Type 2 diabetes mellitus with diabetic chronic kidney disease: Secondary | ICD-10-CM | POA: Diagnosis not present

## 2018-11-14 DIAGNOSIS — N183 Chronic kidney disease, stage 3 unspecified: Secondary | ICD-10-CM | POA: Diagnosis not present

## 2018-11-17 DIAGNOSIS — I5032 Chronic diastolic (congestive) heart failure: Secondary | ICD-10-CM | POA: Diagnosis not present

## 2018-11-17 DIAGNOSIS — I25700 Atherosclerosis of coronary artery bypass graft(s), unspecified, with unstable angina pectoris: Secondary | ICD-10-CM | POA: Diagnosis not present

## 2018-11-17 DIAGNOSIS — E1122 Type 2 diabetes mellitus with diabetic chronic kidney disease: Secondary | ICD-10-CM | POA: Diagnosis not present

## 2018-11-17 DIAGNOSIS — E1121 Type 2 diabetes mellitus with diabetic nephropathy: Secondary | ICD-10-CM | POA: Diagnosis not present

## 2018-11-17 DIAGNOSIS — I130 Hypertensive heart and chronic kidney disease with heart failure and stage 1 through stage 4 chronic kidney disease, or unspecified chronic kidney disease: Secondary | ICD-10-CM | POA: Diagnosis not present

## 2018-11-17 DIAGNOSIS — N183 Chronic kidney disease, stage 3 unspecified: Secondary | ICD-10-CM | POA: Diagnosis not present

## 2018-11-21 DIAGNOSIS — I130 Hypertensive heart and chronic kidney disease with heart failure and stage 1 through stage 4 chronic kidney disease, or unspecified chronic kidney disease: Secondary | ICD-10-CM | POA: Diagnosis not present

## 2018-11-21 DIAGNOSIS — I5032 Chronic diastolic (congestive) heart failure: Secondary | ICD-10-CM | POA: Diagnosis not present

## 2018-11-21 DIAGNOSIS — E1121 Type 2 diabetes mellitus with diabetic nephropathy: Secondary | ICD-10-CM | POA: Diagnosis not present

## 2018-11-21 DIAGNOSIS — Z23 Encounter for immunization: Secondary | ICD-10-CM | POA: Diagnosis not present

## 2018-11-21 DIAGNOSIS — N183 Chronic kidney disease, stage 3 unspecified: Secondary | ICD-10-CM | POA: Diagnosis not present

## 2018-11-21 DIAGNOSIS — I25700 Atherosclerosis of coronary artery bypass graft(s), unspecified, with unstable angina pectoris: Secondary | ICD-10-CM | POA: Diagnosis not present

## 2018-11-21 DIAGNOSIS — E1122 Type 2 diabetes mellitus with diabetic chronic kidney disease: Secondary | ICD-10-CM | POA: Diagnosis not present

## 2018-11-22 ENCOUNTER — Telehealth: Payer: Self-pay | Admitting: *Deleted

## 2018-11-22 NOTE — Telephone Encounter (Signed)
Stacey nurse with Hospice left a voicemail stating that she faxed over a form the end of last week requesting an order to decrease patient's Lantus because her blood sugar has been running low.Christine Blanchard stated that she has not heard anything back and requested a call.

## 2018-11-23 DIAGNOSIS — U071 COVID-19: Secondary | ICD-10-CM | POA: Diagnosis not present

## 2018-11-23 NOTE — Telephone Encounter (Signed)
Recommend d/c lantus at this time.  Note placed in Lisa's box to fax back.

## 2018-11-23 NOTE — Telephone Encounter (Signed)
Faxed form.

## 2018-11-24 DIAGNOSIS — E1122 Type 2 diabetes mellitus with diabetic chronic kidney disease: Secondary | ICD-10-CM | POA: Diagnosis not present

## 2018-11-24 DIAGNOSIS — E1121 Type 2 diabetes mellitus with diabetic nephropathy: Secondary | ICD-10-CM | POA: Diagnosis not present

## 2018-11-24 DIAGNOSIS — I5032 Chronic diastolic (congestive) heart failure: Secondary | ICD-10-CM | POA: Diagnosis not present

## 2018-11-24 DIAGNOSIS — I25700 Atherosclerosis of coronary artery bypass graft(s), unspecified, with unstable angina pectoris: Secondary | ICD-10-CM | POA: Diagnosis not present

## 2018-11-24 DIAGNOSIS — I130 Hypertensive heart and chronic kidney disease with heart failure and stage 1 through stage 4 chronic kidney disease, or unspecified chronic kidney disease: Secondary | ICD-10-CM | POA: Diagnosis not present

## 2018-11-24 DIAGNOSIS — N183 Chronic kidney disease, stage 3 unspecified: Secondary | ICD-10-CM | POA: Diagnosis not present

## 2018-11-28 DIAGNOSIS — I130 Hypertensive heart and chronic kidney disease with heart failure and stage 1 through stage 4 chronic kidney disease, or unspecified chronic kidney disease: Secondary | ICD-10-CM | POA: Diagnosis not present

## 2018-11-28 DIAGNOSIS — E1122 Type 2 diabetes mellitus with diabetic chronic kidney disease: Secondary | ICD-10-CM | POA: Diagnosis not present

## 2018-11-28 DIAGNOSIS — N183 Chronic kidney disease, stage 3 unspecified: Secondary | ICD-10-CM | POA: Diagnosis not present

## 2018-11-28 DIAGNOSIS — I5032 Chronic diastolic (congestive) heart failure: Secondary | ICD-10-CM | POA: Diagnosis not present

## 2018-11-28 DIAGNOSIS — E1121 Type 2 diabetes mellitus with diabetic nephropathy: Secondary | ICD-10-CM | POA: Diagnosis not present

## 2018-11-28 DIAGNOSIS — I25700 Atherosclerosis of coronary artery bypass graft(s), unspecified, with unstable angina pectoris: Secondary | ICD-10-CM | POA: Diagnosis not present

## 2018-12-01 DIAGNOSIS — I130 Hypertensive heart and chronic kidney disease with heart failure and stage 1 through stage 4 chronic kidney disease, or unspecified chronic kidney disease: Secondary | ICD-10-CM | POA: Diagnosis not present

## 2018-12-01 DIAGNOSIS — I25700 Atherosclerosis of coronary artery bypass graft(s), unspecified, with unstable angina pectoris: Secondary | ICD-10-CM | POA: Diagnosis not present

## 2018-12-01 DIAGNOSIS — N183 Chronic kidney disease, stage 3 unspecified: Secondary | ICD-10-CM | POA: Diagnosis not present

## 2018-12-01 DIAGNOSIS — E1122 Type 2 diabetes mellitus with diabetic chronic kidney disease: Secondary | ICD-10-CM | POA: Diagnosis not present

## 2018-12-01 DIAGNOSIS — E1121 Type 2 diabetes mellitus with diabetic nephropathy: Secondary | ICD-10-CM | POA: Diagnosis not present

## 2018-12-01 DIAGNOSIS — I5032 Chronic diastolic (congestive) heart failure: Secondary | ICD-10-CM | POA: Diagnosis not present

## 2018-12-03 DIAGNOSIS — Z87440 Personal history of urinary (tract) infections: Secondary | ICD-10-CM | POA: Diagnosis not present

## 2018-12-03 DIAGNOSIS — K0381 Cracked tooth: Secondary | ICD-10-CM | POA: Diagnosis not present

## 2018-12-03 DIAGNOSIS — Z7901 Long term (current) use of anticoagulants: Secondary | ICD-10-CM | POA: Diagnosis not present

## 2018-12-03 DIAGNOSIS — E1122 Type 2 diabetes mellitus with diabetic chronic kidney disease: Secondary | ICD-10-CM | POA: Diagnosis not present

## 2018-12-03 DIAGNOSIS — Z9981 Dependence on supplemental oxygen: Secondary | ICD-10-CM | POA: Diagnosis not present

## 2018-12-03 DIAGNOSIS — Z741 Need for assistance with personal care: Secondary | ICD-10-CM | POA: Diagnosis not present

## 2018-12-03 DIAGNOSIS — Z7902 Long term (current) use of antithrombotics/antiplatelets: Secondary | ICD-10-CM | POA: Diagnosis not present

## 2018-12-03 DIAGNOSIS — I5032 Chronic diastolic (congestive) heart failure: Secondary | ICD-10-CM | POA: Diagnosis not present

## 2018-12-03 DIAGNOSIS — Z7984 Long term (current) use of oral hypoglycemic drugs: Secondary | ICD-10-CM | POA: Diagnosis not present

## 2018-12-03 DIAGNOSIS — Z681 Body mass index (BMI) 19 or less, adult: Secondary | ICD-10-CM | POA: Diagnosis not present

## 2018-12-03 DIAGNOSIS — E1121 Type 2 diabetes mellitus with diabetic nephropathy: Secondary | ICD-10-CM | POA: Diagnosis not present

## 2018-12-03 DIAGNOSIS — J9611 Chronic respiratory failure with hypoxia: Secondary | ICD-10-CM | POA: Diagnosis not present

## 2018-12-03 DIAGNOSIS — N183 Chronic kidney disease, stage 3 unspecified: Secondary | ICD-10-CM | POA: Diagnosis not present

## 2018-12-03 DIAGNOSIS — I25700 Atherosclerosis of coronary artery bypass graft(s), unspecified, with unstable angina pectoris: Secondary | ICD-10-CM | POA: Diagnosis not present

## 2018-12-03 DIAGNOSIS — R32 Unspecified urinary incontinence: Secondary | ICD-10-CM | POA: Diagnosis not present

## 2018-12-03 DIAGNOSIS — I130 Hypertensive heart and chronic kidney disease with heart failure and stage 1 through stage 4 chronic kidney disease, or unspecified chronic kidney disease: Secondary | ICD-10-CM | POA: Diagnosis not present

## 2018-12-03 DIAGNOSIS — Z947 Corneal transplant status: Secondary | ICD-10-CM | POA: Diagnosis not present

## 2018-12-03 DIAGNOSIS — S81811D Laceration without foreign body, right lower leg, subsequent encounter: Secondary | ICD-10-CM | POA: Diagnosis not present

## 2018-12-03 DIAGNOSIS — K047 Periapical abscess without sinus: Secondary | ICD-10-CM | POA: Diagnosis not present

## 2018-12-03 DIAGNOSIS — Z9181 History of falling: Secondary | ICD-10-CM | POA: Diagnosis not present

## 2018-12-03 DIAGNOSIS — E785 Hyperlipidemia, unspecified: Secondary | ICD-10-CM | POA: Diagnosis not present

## 2018-12-03 DIAGNOSIS — Z993 Dependence on wheelchair: Secondary | ICD-10-CM | POA: Diagnosis not present

## 2018-12-05 DIAGNOSIS — E1121 Type 2 diabetes mellitus with diabetic nephropathy: Secondary | ICD-10-CM | POA: Diagnosis not present

## 2018-12-05 DIAGNOSIS — E1122 Type 2 diabetes mellitus with diabetic chronic kidney disease: Secondary | ICD-10-CM | POA: Diagnosis not present

## 2018-12-05 DIAGNOSIS — I5032 Chronic diastolic (congestive) heart failure: Secondary | ICD-10-CM | POA: Diagnosis not present

## 2018-12-05 DIAGNOSIS — N183 Chronic kidney disease, stage 3 unspecified: Secondary | ICD-10-CM | POA: Diagnosis not present

## 2018-12-05 DIAGNOSIS — I25700 Atherosclerosis of coronary artery bypass graft(s), unspecified, with unstable angina pectoris: Secondary | ICD-10-CM | POA: Diagnosis not present

## 2018-12-05 DIAGNOSIS — I130 Hypertensive heart and chronic kidney disease with heart failure and stage 1 through stage 4 chronic kidney disease, or unspecified chronic kidney disease: Secondary | ICD-10-CM | POA: Diagnosis not present

## 2018-12-08 DIAGNOSIS — N183 Chronic kidney disease, stage 3 unspecified: Secondary | ICD-10-CM | POA: Diagnosis not present

## 2018-12-08 DIAGNOSIS — I130 Hypertensive heart and chronic kidney disease with heart failure and stage 1 through stage 4 chronic kidney disease, or unspecified chronic kidney disease: Secondary | ICD-10-CM | POA: Diagnosis not present

## 2018-12-08 DIAGNOSIS — E1121 Type 2 diabetes mellitus with diabetic nephropathy: Secondary | ICD-10-CM | POA: Diagnosis not present

## 2018-12-08 DIAGNOSIS — I5032 Chronic diastolic (congestive) heart failure: Secondary | ICD-10-CM | POA: Diagnosis not present

## 2018-12-08 DIAGNOSIS — I25700 Atherosclerosis of coronary artery bypass graft(s), unspecified, with unstable angina pectoris: Secondary | ICD-10-CM | POA: Diagnosis not present

## 2018-12-08 DIAGNOSIS — E1122 Type 2 diabetes mellitus with diabetic chronic kidney disease: Secondary | ICD-10-CM | POA: Diagnosis not present

## 2018-12-11 ENCOUNTER — Other Ambulatory Visit: Payer: Self-pay | Admitting: Family Medicine

## 2018-12-12 DIAGNOSIS — I130 Hypertensive heart and chronic kidney disease with heart failure and stage 1 through stage 4 chronic kidney disease, or unspecified chronic kidney disease: Secondary | ICD-10-CM | POA: Diagnosis not present

## 2018-12-12 DIAGNOSIS — E1122 Type 2 diabetes mellitus with diabetic chronic kidney disease: Secondary | ICD-10-CM | POA: Diagnosis not present

## 2018-12-12 DIAGNOSIS — E1121 Type 2 diabetes mellitus with diabetic nephropathy: Secondary | ICD-10-CM | POA: Diagnosis not present

## 2018-12-12 DIAGNOSIS — I5032 Chronic diastolic (congestive) heart failure: Secondary | ICD-10-CM | POA: Diagnosis not present

## 2018-12-12 DIAGNOSIS — I25700 Atherosclerosis of coronary artery bypass graft(s), unspecified, with unstable angina pectoris: Secondary | ICD-10-CM | POA: Diagnosis not present

## 2018-12-12 DIAGNOSIS — N183 Chronic kidney disease, stage 3 unspecified: Secondary | ICD-10-CM | POA: Diagnosis not present

## 2018-12-12 NOTE — Telephone Encounter (Signed)
Plavix last filled:  11/17/18, #30 Docusate sodium last rx:  11/29/13, #90/1 Last OV:  12/15/17, f/u (DM, MDD, etc.) Next OV:  none

## 2018-12-15 DIAGNOSIS — E1121 Type 2 diabetes mellitus with diabetic nephropathy: Secondary | ICD-10-CM | POA: Diagnosis not present

## 2018-12-15 DIAGNOSIS — I25700 Atherosclerosis of coronary artery bypass graft(s), unspecified, with unstable angina pectoris: Secondary | ICD-10-CM | POA: Diagnosis not present

## 2018-12-15 DIAGNOSIS — E1122 Type 2 diabetes mellitus with diabetic chronic kidney disease: Secondary | ICD-10-CM | POA: Diagnosis not present

## 2018-12-15 DIAGNOSIS — N183 Chronic kidney disease, stage 3 unspecified: Secondary | ICD-10-CM | POA: Diagnosis not present

## 2018-12-15 DIAGNOSIS — I5032 Chronic diastolic (congestive) heart failure: Secondary | ICD-10-CM | POA: Diagnosis not present

## 2018-12-15 DIAGNOSIS — I130 Hypertensive heart and chronic kidney disease with heart failure and stage 1 through stage 4 chronic kidney disease, or unspecified chronic kidney disease: Secondary | ICD-10-CM | POA: Diagnosis not present

## 2018-12-19 DIAGNOSIS — E1122 Type 2 diabetes mellitus with diabetic chronic kidney disease: Secondary | ICD-10-CM | POA: Diagnosis not present

## 2018-12-19 DIAGNOSIS — N183 Chronic kidney disease, stage 3 unspecified: Secondary | ICD-10-CM | POA: Diagnosis not present

## 2018-12-19 DIAGNOSIS — I5032 Chronic diastolic (congestive) heart failure: Secondary | ICD-10-CM | POA: Diagnosis not present

## 2018-12-19 DIAGNOSIS — E1121 Type 2 diabetes mellitus with diabetic nephropathy: Secondary | ICD-10-CM | POA: Diagnosis not present

## 2018-12-19 DIAGNOSIS — I130 Hypertensive heart and chronic kidney disease with heart failure and stage 1 through stage 4 chronic kidney disease, or unspecified chronic kidney disease: Secondary | ICD-10-CM | POA: Diagnosis not present

## 2018-12-19 DIAGNOSIS — I25700 Atherosclerosis of coronary artery bypass graft(s), unspecified, with unstable angina pectoris: Secondary | ICD-10-CM | POA: Diagnosis not present

## 2018-12-22 DIAGNOSIS — I5032 Chronic diastolic (congestive) heart failure: Secondary | ICD-10-CM | POA: Diagnosis not present

## 2018-12-22 DIAGNOSIS — I130 Hypertensive heart and chronic kidney disease with heart failure and stage 1 through stage 4 chronic kidney disease, or unspecified chronic kidney disease: Secondary | ICD-10-CM | POA: Diagnosis not present

## 2018-12-22 DIAGNOSIS — I25700 Atherosclerosis of coronary artery bypass graft(s), unspecified, with unstable angina pectoris: Secondary | ICD-10-CM | POA: Diagnosis not present

## 2018-12-22 DIAGNOSIS — N183 Chronic kidney disease, stage 3 unspecified: Secondary | ICD-10-CM | POA: Diagnosis not present

## 2018-12-22 DIAGNOSIS — E1122 Type 2 diabetes mellitus with diabetic chronic kidney disease: Secondary | ICD-10-CM | POA: Diagnosis not present

## 2018-12-22 DIAGNOSIS — E1121 Type 2 diabetes mellitus with diabetic nephropathy: Secondary | ICD-10-CM | POA: Diagnosis not present

## 2018-12-26 DIAGNOSIS — E1122 Type 2 diabetes mellitus with diabetic chronic kidney disease: Secondary | ICD-10-CM | POA: Diagnosis not present

## 2018-12-26 DIAGNOSIS — I25700 Atherosclerosis of coronary artery bypass graft(s), unspecified, with unstable angina pectoris: Secondary | ICD-10-CM | POA: Diagnosis not present

## 2018-12-26 DIAGNOSIS — N183 Chronic kidney disease, stage 3 unspecified: Secondary | ICD-10-CM | POA: Diagnosis not present

## 2018-12-26 DIAGNOSIS — E1121 Type 2 diabetes mellitus with diabetic nephropathy: Secondary | ICD-10-CM | POA: Diagnosis not present

## 2018-12-26 DIAGNOSIS — I5032 Chronic diastolic (congestive) heart failure: Secondary | ICD-10-CM | POA: Diagnosis not present

## 2018-12-26 DIAGNOSIS — I130 Hypertensive heart and chronic kidney disease with heart failure and stage 1 through stage 4 chronic kidney disease, or unspecified chronic kidney disease: Secondary | ICD-10-CM | POA: Diagnosis not present

## 2018-12-27 DIAGNOSIS — I130 Hypertensive heart and chronic kidney disease with heart failure and stage 1 through stage 4 chronic kidney disease, or unspecified chronic kidney disease: Secondary | ICD-10-CM | POA: Diagnosis not present

## 2018-12-27 DIAGNOSIS — E1121 Type 2 diabetes mellitus with diabetic nephropathy: Secondary | ICD-10-CM | POA: Diagnosis not present

## 2018-12-27 DIAGNOSIS — E1122 Type 2 diabetes mellitus with diabetic chronic kidney disease: Secondary | ICD-10-CM | POA: Diagnosis not present

## 2018-12-27 DIAGNOSIS — I5032 Chronic diastolic (congestive) heart failure: Secondary | ICD-10-CM | POA: Diagnosis not present

## 2018-12-27 DIAGNOSIS — I25700 Atherosclerosis of coronary artery bypass graft(s), unspecified, with unstable angina pectoris: Secondary | ICD-10-CM | POA: Diagnosis not present

## 2018-12-27 DIAGNOSIS — N183 Chronic kidney disease, stage 3 unspecified: Secondary | ICD-10-CM | POA: Diagnosis not present

## 2018-12-29 DIAGNOSIS — I130 Hypertensive heart and chronic kidney disease with heart failure and stage 1 through stage 4 chronic kidney disease, or unspecified chronic kidney disease: Secondary | ICD-10-CM | POA: Diagnosis not present

## 2018-12-29 DIAGNOSIS — E1122 Type 2 diabetes mellitus with diabetic chronic kidney disease: Secondary | ICD-10-CM | POA: Diagnosis not present

## 2018-12-29 DIAGNOSIS — N183 Chronic kidney disease, stage 3 unspecified: Secondary | ICD-10-CM | POA: Diagnosis not present

## 2018-12-29 DIAGNOSIS — I25700 Atherosclerosis of coronary artery bypass graft(s), unspecified, with unstable angina pectoris: Secondary | ICD-10-CM | POA: Diagnosis not present

## 2018-12-29 DIAGNOSIS — I5032 Chronic diastolic (congestive) heart failure: Secondary | ICD-10-CM | POA: Diagnosis not present

## 2018-12-29 DIAGNOSIS — E1121 Type 2 diabetes mellitus with diabetic nephropathy: Secondary | ICD-10-CM | POA: Diagnosis not present

## 2019-01-02 DIAGNOSIS — U071 COVID-19: Secondary | ICD-10-CM

## 2019-01-02 HISTORY — DX: COVID-19: U07.1

## 2019-01-04 ENCOUNTER — Other Ambulatory Visit: Payer: Self-pay | Admitting: Family Medicine

## 2019-01-04 NOTE — Telephone Encounter (Signed)
Name of Medication: Tramadol Name of Pharmacy: [place] or Written Date and Quantity: 12/17/18, #90 Last Office Visit and Type: 12/15/17, f/u Next Office Visit and Type: none Last Controlled Substance Agreement Date: none Last UDS: none

## 2019-01-05 NOTE — Telephone Encounter (Signed)
ERx 

## 2019-01-11 DIAGNOSIS — U071 COVID-19: Secondary | ICD-10-CM | POA: Diagnosis not present

## 2019-01-11 DIAGNOSIS — Z20828 Contact with and (suspected) exposure to other viral communicable diseases: Secondary | ICD-10-CM | POA: Diagnosis not present

## 2019-01-12 DIAGNOSIS — U071 COVID-19: Secondary | ICD-10-CM | POA: Diagnosis not present

## 2019-01-18 DIAGNOSIS — Z20828 Contact with and (suspected) exposure to other viral communicable diseases: Secondary | ICD-10-CM | POA: Diagnosis not present

## 2019-01-18 DIAGNOSIS — U071 COVID-19: Secondary | ICD-10-CM | POA: Diagnosis not present

## 2019-01-23 DIAGNOSIS — J1001 Influenza due to other identified influenza virus with the same other identified influenza virus pneumonia: Secondary | ICD-10-CM | POA: Diagnosis not present

## 2019-01-23 DIAGNOSIS — Z20828 Contact with and (suspected) exposure to other viral communicable diseases: Secondary | ICD-10-CM | POA: Diagnosis not present

## 2019-01-23 DIAGNOSIS — B974 Respiratory syncytial virus as the cause of diseases classified elsewhere: Secondary | ICD-10-CM | POA: Diagnosis not present

## 2019-01-23 DIAGNOSIS — U071 COVID-19: Secondary | ICD-10-CM | POA: Diagnosis not present

## 2019-02-02 DIAGNOSIS — K047 Periapical abscess without sinus: Secondary | ICD-10-CM | POA: Diagnosis not present

## 2019-02-02 DIAGNOSIS — I5032 Chronic diastolic (congestive) heart failure: Secondary | ICD-10-CM | POA: Diagnosis not present

## 2019-02-02 DIAGNOSIS — Z993 Dependence on wheelchair: Secondary | ICD-10-CM | POA: Diagnosis not present

## 2019-02-02 DIAGNOSIS — Z9981 Dependence on supplemental oxygen: Secondary | ICD-10-CM | POA: Diagnosis not present

## 2019-02-02 DIAGNOSIS — R230 Cyanosis: Secondary | ICD-10-CM | POA: Diagnosis not present

## 2019-02-02 DIAGNOSIS — Z741 Need for assistance with personal care: Secondary | ICD-10-CM | POA: Diagnosis not present

## 2019-02-02 DIAGNOSIS — E1122 Type 2 diabetes mellitus with diabetic chronic kidney disease: Secondary | ICD-10-CM | POA: Diagnosis not present

## 2019-02-02 DIAGNOSIS — R32 Unspecified urinary incontinence: Secondary | ICD-10-CM | POA: Diagnosis not present

## 2019-02-02 DIAGNOSIS — I25700 Atherosclerosis of coronary artery bypass graft(s), unspecified, with unstable angina pectoris: Secondary | ICD-10-CM | POA: Diagnosis not present

## 2019-02-02 DIAGNOSIS — N183 Chronic kidney disease, stage 3 unspecified: Secondary | ICD-10-CM | POA: Diagnosis not present

## 2019-02-02 DIAGNOSIS — K0381 Cracked tooth: Secondary | ICD-10-CM | POA: Diagnosis not present

## 2019-02-02 DIAGNOSIS — Z9181 History of falling: Secondary | ICD-10-CM | POA: Diagnosis not present

## 2019-02-02 DIAGNOSIS — S81811D Laceration without foreign body, right lower leg, subsequent encounter: Secondary | ICD-10-CM | POA: Diagnosis not present

## 2019-02-02 DIAGNOSIS — Z7902 Long term (current) use of antithrombotics/antiplatelets: Secondary | ICD-10-CM | POA: Diagnosis not present

## 2019-02-02 DIAGNOSIS — E785 Hyperlipidemia, unspecified: Secondary | ICD-10-CM | POA: Diagnosis not present

## 2019-02-02 DIAGNOSIS — E1121 Type 2 diabetes mellitus with diabetic nephropathy: Secondary | ICD-10-CM | POA: Diagnosis not present

## 2019-02-02 DIAGNOSIS — R627 Adult failure to thrive: Secondary | ICD-10-CM | POA: Diagnosis not present

## 2019-02-02 DIAGNOSIS — J9611 Chronic respiratory failure with hypoxia: Secondary | ICD-10-CM | POA: Diagnosis not present

## 2019-02-02 DIAGNOSIS — Z7901 Long term (current) use of anticoagulants: Secondary | ICD-10-CM | POA: Diagnosis not present

## 2019-02-02 DIAGNOSIS — U071 COVID-19: Secondary | ICD-10-CM | POA: Diagnosis not present

## 2019-02-02 DIAGNOSIS — Z7984 Long term (current) use of oral hypoglycemic drugs: Secondary | ICD-10-CM | POA: Diagnosis not present

## 2019-02-02 DIAGNOSIS — Z87440 Personal history of urinary (tract) infections: Secondary | ICD-10-CM | POA: Diagnosis not present

## 2019-02-02 DIAGNOSIS — R634 Abnormal weight loss: Secondary | ICD-10-CM | POA: Diagnosis not present

## 2019-02-02 DIAGNOSIS — I130 Hypertensive heart and chronic kidney disease with heart failure and stage 1 through stage 4 chronic kidney disease, or unspecified chronic kidney disease: Secondary | ICD-10-CM | POA: Diagnosis not present

## 2019-02-02 DIAGNOSIS — R0601 Orthopnea: Secondary | ICD-10-CM | POA: Diagnosis not present

## 2019-02-06 DIAGNOSIS — I5032 Chronic diastolic (congestive) heart failure: Secondary | ICD-10-CM | POA: Diagnosis not present

## 2019-02-06 DIAGNOSIS — E1121 Type 2 diabetes mellitus with diabetic nephropathy: Secondary | ICD-10-CM | POA: Diagnosis not present

## 2019-02-06 DIAGNOSIS — N183 Chronic kidney disease, stage 3 unspecified: Secondary | ICD-10-CM | POA: Diagnosis not present

## 2019-02-06 DIAGNOSIS — I130 Hypertensive heart and chronic kidney disease with heart failure and stage 1 through stage 4 chronic kidney disease, or unspecified chronic kidney disease: Secondary | ICD-10-CM | POA: Diagnosis not present

## 2019-02-06 DIAGNOSIS — E1122 Type 2 diabetes mellitus with diabetic chronic kidney disease: Secondary | ICD-10-CM | POA: Diagnosis not present

## 2019-02-06 DIAGNOSIS — I25700 Atherosclerosis of coronary artery bypass graft(s), unspecified, with unstable angina pectoris: Secondary | ICD-10-CM | POA: Diagnosis not present

## 2019-02-07 DIAGNOSIS — I5032 Chronic diastolic (congestive) heart failure: Secondary | ICD-10-CM | POA: Diagnosis not present

## 2019-02-07 DIAGNOSIS — I25700 Atherosclerosis of coronary artery bypass graft(s), unspecified, with unstable angina pectoris: Secondary | ICD-10-CM | POA: Diagnosis not present

## 2019-02-07 DIAGNOSIS — E1121 Type 2 diabetes mellitus with diabetic nephropathy: Secondary | ICD-10-CM | POA: Diagnosis not present

## 2019-02-07 DIAGNOSIS — E1122 Type 2 diabetes mellitus with diabetic chronic kidney disease: Secondary | ICD-10-CM | POA: Diagnosis not present

## 2019-02-07 DIAGNOSIS — N183 Chronic kidney disease, stage 3 unspecified: Secondary | ICD-10-CM | POA: Diagnosis not present

## 2019-02-07 DIAGNOSIS — I130 Hypertensive heart and chronic kidney disease with heart failure and stage 1 through stage 4 chronic kidney disease, or unspecified chronic kidney disease: Secondary | ICD-10-CM | POA: Diagnosis not present

## 2019-02-08 ENCOUNTER — Other Ambulatory Visit: Payer: Self-pay | Admitting: Family Medicine

## 2019-02-08 NOTE — Telephone Encounter (Signed)
ERx 

## 2019-02-09 DIAGNOSIS — I130 Hypertensive heart and chronic kidney disease with heart failure and stage 1 through stage 4 chronic kidney disease, or unspecified chronic kidney disease: Secondary | ICD-10-CM | POA: Diagnosis not present

## 2019-02-09 DIAGNOSIS — E1121 Type 2 diabetes mellitus with diabetic nephropathy: Secondary | ICD-10-CM | POA: Diagnosis not present

## 2019-02-09 DIAGNOSIS — N183 Chronic kidney disease, stage 3 unspecified: Secondary | ICD-10-CM | POA: Diagnosis not present

## 2019-02-09 DIAGNOSIS — I5032 Chronic diastolic (congestive) heart failure: Secondary | ICD-10-CM | POA: Diagnosis not present

## 2019-02-09 DIAGNOSIS — I25700 Atherosclerosis of coronary artery bypass graft(s), unspecified, with unstable angina pectoris: Secondary | ICD-10-CM | POA: Diagnosis not present

## 2019-02-09 DIAGNOSIS — E1122 Type 2 diabetes mellitus with diabetic chronic kidney disease: Secondary | ICD-10-CM | POA: Diagnosis not present

## 2019-02-12 ENCOUNTER — Encounter: Payer: Self-pay | Admitting: Family Medicine

## 2019-02-13 DIAGNOSIS — E1121 Type 2 diabetes mellitus with diabetic nephropathy: Secondary | ICD-10-CM | POA: Diagnosis not present

## 2019-02-13 DIAGNOSIS — I130 Hypertensive heart and chronic kidney disease with heart failure and stage 1 through stage 4 chronic kidney disease, or unspecified chronic kidney disease: Secondary | ICD-10-CM | POA: Diagnosis not present

## 2019-02-13 DIAGNOSIS — I25700 Atherosclerosis of coronary artery bypass graft(s), unspecified, with unstable angina pectoris: Secondary | ICD-10-CM | POA: Diagnosis not present

## 2019-02-13 DIAGNOSIS — I5032 Chronic diastolic (congestive) heart failure: Secondary | ICD-10-CM | POA: Diagnosis not present

## 2019-02-13 DIAGNOSIS — N183 Chronic kidney disease, stage 3 unspecified: Secondary | ICD-10-CM | POA: Diagnosis not present

## 2019-02-13 DIAGNOSIS — E1122 Type 2 diabetes mellitus with diabetic chronic kidney disease: Secondary | ICD-10-CM | POA: Diagnosis not present

## 2019-02-16 DIAGNOSIS — I5032 Chronic diastolic (congestive) heart failure: Secondary | ICD-10-CM | POA: Diagnosis not present

## 2019-02-16 DIAGNOSIS — I130 Hypertensive heart and chronic kidney disease with heart failure and stage 1 through stage 4 chronic kidney disease, or unspecified chronic kidney disease: Secondary | ICD-10-CM | POA: Diagnosis not present

## 2019-02-16 DIAGNOSIS — E1121 Type 2 diabetes mellitus with diabetic nephropathy: Secondary | ICD-10-CM | POA: Diagnosis not present

## 2019-02-16 DIAGNOSIS — I25700 Atherosclerosis of coronary artery bypass graft(s), unspecified, with unstable angina pectoris: Secondary | ICD-10-CM | POA: Diagnosis not present

## 2019-02-16 DIAGNOSIS — N183 Chronic kidney disease, stage 3 unspecified: Secondary | ICD-10-CM | POA: Diagnosis not present

## 2019-02-16 DIAGNOSIS — E1122 Type 2 diabetes mellitus with diabetic chronic kidney disease: Secondary | ICD-10-CM | POA: Diagnosis not present

## 2019-02-17 ENCOUNTER — Other Ambulatory Visit: Payer: Self-pay | Admitting: Family Medicine

## 2019-02-20 DIAGNOSIS — I5032 Chronic diastolic (congestive) heart failure: Secondary | ICD-10-CM | POA: Diagnosis not present

## 2019-02-20 DIAGNOSIS — N183 Chronic kidney disease, stage 3 unspecified: Secondary | ICD-10-CM | POA: Diagnosis not present

## 2019-02-20 DIAGNOSIS — E1121 Type 2 diabetes mellitus with diabetic nephropathy: Secondary | ICD-10-CM | POA: Diagnosis not present

## 2019-02-20 DIAGNOSIS — I130 Hypertensive heart and chronic kidney disease with heart failure and stage 1 through stage 4 chronic kidney disease, or unspecified chronic kidney disease: Secondary | ICD-10-CM | POA: Diagnosis not present

## 2019-02-20 DIAGNOSIS — E1122 Type 2 diabetes mellitus with diabetic chronic kidney disease: Secondary | ICD-10-CM | POA: Diagnosis not present

## 2019-02-20 DIAGNOSIS — I25700 Atherosclerosis of coronary artery bypass graft(s), unspecified, with unstable angina pectoris: Secondary | ICD-10-CM | POA: Diagnosis not present

## 2019-02-23 DIAGNOSIS — I130 Hypertensive heart and chronic kidney disease with heart failure and stage 1 through stage 4 chronic kidney disease, or unspecified chronic kidney disease: Secondary | ICD-10-CM | POA: Diagnosis not present

## 2019-02-23 DIAGNOSIS — E1122 Type 2 diabetes mellitus with diabetic chronic kidney disease: Secondary | ICD-10-CM | POA: Diagnosis not present

## 2019-02-23 DIAGNOSIS — I25700 Atherosclerosis of coronary artery bypass graft(s), unspecified, with unstable angina pectoris: Secondary | ICD-10-CM | POA: Diagnosis not present

## 2019-02-23 DIAGNOSIS — I5032 Chronic diastolic (congestive) heart failure: Secondary | ICD-10-CM | POA: Diagnosis not present

## 2019-02-23 DIAGNOSIS — N183 Chronic kidney disease, stage 3 unspecified: Secondary | ICD-10-CM | POA: Diagnosis not present

## 2019-02-23 DIAGNOSIS — E1121 Type 2 diabetes mellitus with diabetic nephropathy: Secondary | ICD-10-CM | POA: Diagnosis not present

## 2019-02-27 DIAGNOSIS — I25700 Atherosclerosis of coronary artery bypass graft(s), unspecified, with unstable angina pectoris: Secondary | ICD-10-CM | POA: Diagnosis not present

## 2019-02-27 DIAGNOSIS — E1121 Type 2 diabetes mellitus with diabetic nephropathy: Secondary | ICD-10-CM | POA: Diagnosis not present

## 2019-02-27 DIAGNOSIS — I130 Hypertensive heart and chronic kidney disease with heart failure and stage 1 through stage 4 chronic kidney disease, or unspecified chronic kidney disease: Secondary | ICD-10-CM | POA: Diagnosis not present

## 2019-02-27 DIAGNOSIS — I5032 Chronic diastolic (congestive) heart failure: Secondary | ICD-10-CM | POA: Diagnosis not present

## 2019-02-27 DIAGNOSIS — E1122 Type 2 diabetes mellitus with diabetic chronic kidney disease: Secondary | ICD-10-CM | POA: Diagnosis not present

## 2019-02-27 DIAGNOSIS — N183 Chronic kidney disease, stage 3 unspecified: Secondary | ICD-10-CM | POA: Diagnosis not present

## 2019-03-02 DIAGNOSIS — I130 Hypertensive heart and chronic kidney disease with heart failure and stage 1 through stage 4 chronic kidney disease, or unspecified chronic kidney disease: Secondary | ICD-10-CM | POA: Diagnosis not present

## 2019-03-02 DIAGNOSIS — N183 Chronic kidney disease, stage 3 unspecified: Secondary | ICD-10-CM | POA: Diagnosis not present

## 2019-03-02 DIAGNOSIS — I25700 Atherosclerosis of coronary artery bypass graft(s), unspecified, with unstable angina pectoris: Secondary | ICD-10-CM | POA: Diagnosis not present

## 2019-03-02 DIAGNOSIS — E1121 Type 2 diabetes mellitus with diabetic nephropathy: Secondary | ICD-10-CM | POA: Diagnosis not present

## 2019-03-02 DIAGNOSIS — I5032 Chronic diastolic (congestive) heart failure: Secondary | ICD-10-CM | POA: Diagnosis not present

## 2019-03-02 DIAGNOSIS — E1122 Type 2 diabetes mellitus with diabetic chronic kidney disease: Secondary | ICD-10-CM | POA: Diagnosis not present

## 2019-03-05 DIAGNOSIS — S81811D Laceration without foreign body, right lower leg, subsequent encounter: Secondary | ICD-10-CM | POA: Diagnosis not present

## 2019-03-05 DIAGNOSIS — Z7984 Long term (current) use of oral hypoglycemic drugs: Secondary | ICD-10-CM | POA: Diagnosis not present

## 2019-03-05 DIAGNOSIS — R627 Adult failure to thrive: Secondary | ICD-10-CM | POA: Diagnosis not present

## 2019-03-05 DIAGNOSIS — R0601 Orthopnea: Secondary | ICD-10-CM | POA: Diagnosis not present

## 2019-03-05 DIAGNOSIS — J9611 Chronic respiratory failure with hypoxia: Secondary | ICD-10-CM | POA: Diagnosis not present

## 2019-03-05 DIAGNOSIS — Z741 Need for assistance with personal care: Secondary | ICD-10-CM | POA: Diagnosis not present

## 2019-03-05 DIAGNOSIS — K047 Periapical abscess without sinus: Secondary | ICD-10-CM | POA: Diagnosis not present

## 2019-03-05 DIAGNOSIS — R32 Unspecified urinary incontinence: Secondary | ICD-10-CM | POA: Diagnosis not present

## 2019-03-05 DIAGNOSIS — Z9981 Dependence on supplemental oxygen: Secondary | ICD-10-CM | POA: Diagnosis not present

## 2019-03-05 DIAGNOSIS — Z8616 Personal history of COVID-19: Secondary | ICD-10-CM | POA: Diagnosis not present

## 2019-03-05 DIAGNOSIS — Z993 Dependence on wheelchair: Secondary | ICD-10-CM | POA: Diagnosis not present

## 2019-03-05 DIAGNOSIS — N183 Chronic kidney disease, stage 3 unspecified: Secondary | ICD-10-CM | POA: Diagnosis not present

## 2019-03-05 DIAGNOSIS — R230 Cyanosis: Secondary | ICD-10-CM | POA: Diagnosis not present

## 2019-03-05 DIAGNOSIS — Z7902 Long term (current) use of antithrombotics/antiplatelets: Secondary | ICD-10-CM | POA: Diagnosis not present

## 2019-03-05 DIAGNOSIS — R634 Abnormal weight loss: Secondary | ICD-10-CM | POA: Diagnosis not present

## 2019-03-05 DIAGNOSIS — I5032 Chronic diastolic (congestive) heart failure: Secondary | ICD-10-CM | POA: Diagnosis not present

## 2019-03-05 DIAGNOSIS — I130 Hypertensive heart and chronic kidney disease with heart failure and stage 1 through stage 4 chronic kidney disease, or unspecified chronic kidney disease: Secondary | ICD-10-CM | POA: Diagnosis not present

## 2019-03-05 DIAGNOSIS — E1122 Type 2 diabetes mellitus with diabetic chronic kidney disease: Secondary | ICD-10-CM | POA: Diagnosis not present

## 2019-03-05 DIAGNOSIS — K0381 Cracked tooth: Secondary | ICD-10-CM | POA: Diagnosis not present

## 2019-03-05 DIAGNOSIS — I25700 Atherosclerosis of coronary artery bypass graft(s), unspecified, with unstable angina pectoris: Secondary | ICD-10-CM | POA: Diagnosis not present

## 2019-03-05 DIAGNOSIS — E785 Hyperlipidemia, unspecified: Secondary | ICD-10-CM | POA: Diagnosis not present

## 2019-03-05 DIAGNOSIS — Z9181 History of falling: Secondary | ICD-10-CM | POA: Diagnosis not present

## 2019-03-05 DIAGNOSIS — Z7901 Long term (current) use of anticoagulants: Secondary | ICD-10-CM | POA: Diagnosis not present

## 2019-03-05 DIAGNOSIS — E1121 Type 2 diabetes mellitus with diabetic nephropathy: Secondary | ICD-10-CM | POA: Diagnosis not present

## 2019-03-05 DIAGNOSIS — Z87440 Personal history of urinary (tract) infections: Secondary | ICD-10-CM | POA: Diagnosis not present

## 2019-03-06 DIAGNOSIS — E1122 Type 2 diabetes mellitus with diabetic chronic kidney disease: Secondary | ICD-10-CM | POA: Diagnosis not present

## 2019-03-06 DIAGNOSIS — N183 Chronic kidney disease, stage 3 unspecified: Secondary | ICD-10-CM | POA: Diagnosis not present

## 2019-03-06 DIAGNOSIS — I130 Hypertensive heart and chronic kidney disease with heart failure and stage 1 through stage 4 chronic kidney disease, or unspecified chronic kidney disease: Secondary | ICD-10-CM | POA: Diagnosis not present

## 2019-03-06 DIAGNOSIS — I25700 Atherosclerosis of coronary artery bypass graft(s), unspecified, with unstable angina pectoris: Secondary | ICD-10-CM | POA: Diagnosis not present

## 2019-03-06 DIAGNOSIS — I5032 Chronic diastolic (congestive) heart failure: Secondary | ICD-10-CM | POA: Diagnosis not present

## 2019-03-06 DIAGNOSIS — E1121 Type 2 diabetes mellitus with diabetic nephropathy: Secondary | ICD-10-CM | POA: Diagnosis not present

## 2019-03-09 DIAGNOSIS — I130 Hypertensive heart and chronic kidney disease with heart failure and stage 1 through stage 4 chronic kidney disease, or unspecified chronic kidney disease: Secondary | ICD-10-CM | POA: Diagnosis not present

## 2019-03-09 DIAGNOSIS — I25700 Atherosclerosis of coronary artery bypass graft(s), unspecified, with unstable angina pectoris: Secondary | ICD-10-CM | POA: Diagnosis not present

## 2019-03-09 DIAGNOSIS — E1122 Type 2 diabetes mellitus with diabetic chronic kidney disease: Secondary | ICD-10-CM | POA: Diagnosis not present

## 2019-03-09 DIAGNOSIS — N183 Chronic kidney disease, stage 3 unspecified: Secondary | ICD-10-CM | POA: Diagnosis not present

## 2019-03-09 DIAGNOSIS — I5032 Chronic diastolic (congestive) heart failure: Secondary | ICD-10-CM | POA: Diagnosis not present

## 2019-03-09 DIAGNOSIS — E1121 Type 2 diabetes mellitus with diabetic nephropathy: Secondary | ICD-10-CM | POA: Diagnosis not present

## 2019-03-13 DIAGNOSIS — I130 Hypertensive heart and chronic kidney disease with heart failure and stage 1 through stage 4 chronic kidney disease, or unspecified chronic kidney disease: Secondary | ICD-10-CM | POA: Diagnosis not present

## 2019-03-13 DIAGNOSIS — I5032 Chronic diastolic (congestive) heart failure: Secondary | ICD-10-CM | POA: Diagnosis not present

## 2019-03-13 DIAGNOSIS — E1122 Type 2 diabetes mellitus with diabetic chronic kidney disease: Secondary | ICD-10-CM | POA: Diagnosis not present

## 2019-03-13 DIAGNOSIS — N183 Chronic kidney disease, stage 3 unspecified: Secondary | ICD-10-CM | POA: Diagnosis not present

## 2019-03-13 DIAGNOSIS — E1121 Type 2 diabetes mellitus with diabetic nephropathy: Secondary | ICD-10-CM | POA: Diagnosis not present

## 2019-03-13 DIAGNOSIS — I25700 Atherosclerosis of coronary artery bypass graft(s), unspecified, with unstable angina pectoris: Secondary | ICD-10-CM | POA: Diagnosis not present

## 2019-03-14 ENCOUNTER — Other Ambulatory Visit: Payer: Self-pay | Admitting: Family Medicine

## 2019-03-14 DIAGNOSIS — I5032 Chronic diastolic (congestive) heart failure: Secondary | ICD-10-CM | POA: Diagnosis not present

## 2019-03-14 DIAGNOSIS — I130 Hypertensive heart and chronic kidney disease with heart failure and stage 1 through stage 4 chronic kidney disease, or unspecified chronic kidney disease: Secondary | ICD-10-CM | POA: Diagnosis not present

## 2019-03-14 DIAGNOSIS — N183 Chronic kidney disease, stage 3 unspecified: Secondary | ICD-10-CM | POA: Diagnosis not present

## 2019-03-14 DIAGNOSIS — I25700 Atherosclerosis of coronary artery bypass graft(s), unspecified, with unstable angina pectoris: Secondary | ICD-10-CM | POA: Diagnosis not present

## 2019-03-14 DIAGNOSIS — E1122 Type 2 diabetes mellitus with diabetic chronic kidney disease: Secondary | ICD-10-CM | POA: Diagnosis not present

## 2019-03-14 DIAGNOSIS — E1121 Type 2 diabetes mellitus with diabetic nephropathy: Secondary | ICD-10-CM | POA: Diagnosis not present

## 2019-03-14 NOTE — Telephone Encounter (Signed)
Last office visit 12/15/2017 for 3 month follow up.  Last refilled 01/05/2019 for #90 with 4 refills with instructions to take one tablet tid.   Pharmacy is asking for #105 with patient taking 1 1/2 tablets at bedtime.  No future appointments.

## 2019-03-14 NOTE — Telephone Encounter (Signed)
ERx 

## 2019-03-16 DIAGNOSIS — N183 Chronic kidney disease, stage 3 unspecified: Secondary | ICD-10-CM | POA: Diagnosis not present

## 2019-03-16 DIAGNOSIS — E1122 Type 2 diabetes mellitus with diabetic chronic kidney disease: Secondary | ICD-10-CM | POA: Diagnosis not present

## 2019-03-16 DIAGNOSIS — I130 Hypertensive heart and chronic kidney disease with heart failure and stage 1 through stage 4 chronic kidney disease, or unspecified chronic kidney disease: Secondary | ICD-10-CM | POA: Diagnosis not present

## 2019-03-16 DIAGNOSIS — I25700 Atherosclerosis of coronary artery bypass graft(s), unspecified, with unstable angina pectoris: Secondary | ICD-10-CM | POA: Diagnosis not present

## 2019-03-16 DIAGNOSIS — I5032 Chronic diastolic (congestive) heart failure: Secondary | ICD-10-CM | POA: Diagnosis not present

## 2019-03-16 DIAGNOSIS — E1121 Type 2 diabetes mellitus with diabetic nephropathy: Secondary | ICD-10-CM | POA: Diagnosis not present

## 2019-03-19 DIAGNOSIS — N183 Chronic kidney disease, stage 3 unspecified: Secondary | ICD-10-CM | POA: Diagnosis not present

## 2019-03-19 DIAGNOSIS — I25700 Atherosclerosis of coronary artery bypass graft(s), unspecified, with unstable angina pectoris: Secondary | ICD-10-CM | POA: Diagnosis not present

## 2019-03-19 DIAGNOSIS — E1121 Type 2 diabetes mellitus with diabetic nephropathy: Secondary | ICD-10-CM | POA: Diagnosis not present

## 2019-03-19 DIAGNOSIS — I130 Hypertensive heart and chronic kidney disease with heart failure and stage 1 through stage 4 chronic kidney disease, or unspecified chronic kidney disease: Secondary | ICD-10-CM | POA: Diagnosis not present

## 2019-03-19 DIAGNOSIS — I5032 Chronic diastolic (congestive) heart failure: Secondary | ICD-10-CM | POA: Diagnosis not present

## 2019-03-19 DIAGNOSIS — E1122 Type 2 diabetes mellitus with diabetic chronic kidney disease: Secondary | ICD-10-CM | POA: Diagnosis not present

## 2019-03-20 DIAGNOSIS — I25700 Atherosclerosis of coronary artery bypass graft(s), unspecified, with unstable angina pectoris: Secondary | ICD-10-CM | POA: Diagnosis not present

## 2019-03-20 DIAGNOSIS — I130 Hypertensive heart and chronic kidney disease with heart failure and stage 1 through stage 4 chronic kidney disease, or unspecified chronic kidney disease: Secondary | ICD-10-CM | POA: Diagnosis not present

## 2019-03-20 DIAGNOSIS — E1121 Type 2 diabetes mellitus with diabetic nephropathy: Secondary | ICD-10-CM | POA: Diagnosis not present

## 2019-03-20 DIAGNOSIS — N183 Chronic kidney disease, stage 3 unspecified: Secondary | ICD-10-CM | POA: Diagnosis not present

## 2019-03-20 DIAGNOSIS — E1122 Type 2 diabetes mellitus with diabetic chronic kidney disease: Secondary | ICD-10-CM | POA: Diagnosis not present

## 2019-03-20 DIAGNOSIS — I5032 Chronic diastolic (congestive) heart failure: Secondary | ICD-10-CM | POA: Diagnosis not present

## 2019-03-23 DIAGNOSIS — I5032 Chronic diastolic (congestive) heart failure: Secondary | ICD-10-CM | POA: Diagnosis not present

## 2019-03-23 DIAGNOSIS — E1122 Type 2 diabetes mellitus with diabetic chronic kidney disease: Secondary | ICD-10-CM | POA: Diagnosis not present

## 2019-03-23 DIAGNOSIS — I130 Hypertensive heart and chronic kidney disease with heart failure and stage 1 through stage 4 chronic kidney disease, or unspecified chronic kidney disease: Secondary | ICD-10-CM | POA: Diagnosis not present

## 2019-03-23 DIAGNOSIS — N183 Chronic kidney disease, stage 3 unspecified: Secondary | ICD-10-CM | POA: Diagnosis not present

## 2019-03-23 DIAGNOSIS — E1121 Type 2 diabetes mellitus with diabetic nephropathy: Secondary | ICD-10-CM | POA: Diagnosis not present

## 2019-03-23 DIAGNOSIS — I25700 Atherosclerosis of coronary artery bypass graft(s), unspecified, with unstable angina pectoris: Secondary | ICD-10-CM | POA: Diagnosis not present

## 2019-03-27 DIAGNOSIS — E1122 Type 2 diabetes mellitus with diabetic chronic kidney disease: Secondary | ICD-10-CM | POA: Diagnosis not present

## 2019-03-27 DIAGNOSIS — I5032 Chronic diastolic (congestive) heart failure: Secondary | ICD-10-CM | POA: Diagnosis not present

## 2019-03-27 DIAGNOSIS — E1121 Type 2 diabetes mellitus with diabetic nephropathy: Secondary | ICD-10-CM | POA: Diagnosis not present

## 2019-03-27 DIAGNOSIS — I25700 Atherosclerosis of coronary artery bypass graft(s), unspecified, with unstable angina pectoris: Secondary | ICD-10-CM | POA: Diagnosis not present

## 2019-03-27 DIAGNOSIS — N183 Chronic kidney disease, stage 3 unspecified: Secondary | ICD-10-CM | POA: Diagnosis not present

## 2019-03-27 DIAGNOSIS — I130 Hypertensive heart and chronic kidney disease with heart failure and stage 1 through stage 4 chronic kidney disease, or unspecified chronic kidney disease: Secondary | ICD-10-CM | POA: Diagnosis not present

## 2019-03-28 DIAGNOSIS — I130 Hypertensive heart and chronic kidney disease with heart failure and stage 1 through stage 4 chronic kidney disease, or unspecified chronic kidney disease: Secondary | ICD-10-CM | POA: Diagnosis not present

## 2019-03-28 DIAGNOSIS — I5032 Chronic diastolic (congestive) heart failure: Secondary | ICD-10-CM | POA: Diagnosis not present

## 2019-03-28 DIAGNOSIS — E1122 Type 2 diabetes mellitus with diabetic chronic kidney disease: Secondary | ICD-10-CM | POA: Diagnosis not present

## 2019-03-28 DIAGNOSIS — I25700 Atherosclerosis of coronary artery bypass graft(s), unspecified, with unstable angina pectoris: Secondary | ICD-10-CM | POA: Diagnosis not present

## 2019-03-28 DIAGNOSIS — E1121 Type 2 diabetes mellitus with diabetic nephropathy: Secondary | ICD-10-CM | POA: Diagnosis not present

## 2019-03-28 DIAGNOSIS — N183 Chronic kidney disease, stage 3 unspecified: Secondary | ICD-10-CM | POA: Diagnosis not present

## 2019-03-30 DIAGNOSIS — I130 Hypertensive heart and chronic kidney disease with heart failure and stage 1 through stage 4 chronic kidney disease, or unspecified chronic kidney disease: Secondary | ICD-10-CM | POA: Diagnosis not present

## 2019-03-30 DIAGNOSIS — I5032 Chronic diastolic (congestive) heart failure: Secondary | ICD-10-CM | POA: Diagnosis not present

## 2019-03-30 DIAGNOSIS — E1122 Type 2 diabetes mellitus with diabetic chronic kidney disease: Secondary | ICD-10-CM | POA: Diagnosis not present

## 2019-03-30 DIAGNOSIS — E1121 Type 2 diabetes mellitus with diabetic nephropathy: Secondary | ICD-10-CM | POA: Diagnosis not present

## 2019-03-30 DIAGNOSIS — I25700 Atherosclerosis of coronary artery bypass graft(s), unspecified, with unstable angina pectoris: Secondary | ICD-10-CM | POA: Diagnosis not present

## 2019-03-30 DIAGNOSIS — N183 Chronic kidney disease, stage 3 unspecified: Secondary | ICD-10-CM | POA: Diagnosis not present

## 2019-04-02 DIAGNOSIS — S81811D Laceration without foreign body, right lower leg, subsequent encounter: Secondary | ICD-10-CM | POA: Diagnosis not present

## 2019-04-02 DIAGNOSIS — Z9981 Dependence on supplemental oxygen: Secondary | ICD-10-CM | POA: Diagnosis not present

## 2019-04-02 DIAGNOSIS — I130 Hypertensive heart and chronic kidney disease with heart failure and stage 1 through stage 4 chronic kidney disease, or unspecified chronic kidney disease: Secondary | ICD-10-CM | POA: Diagnosis not present

## 2019-04-02 DIAGNOSIS — Z87440 Personal history of urinary (tract) infections: Secondary | ICD-10-CM | POA: Diagnosis not present

## 2019-04-02 DIAGNOSIS — I5032 Chronic diastolic (congestive) heart failure: Secondary | ICD-10-CM | POA: Diagnosis not present

## 2019-04-02 DIAGNOSIS — E1121 Type 2 diabetes mellitus with diabetic nephropathy: Secondary | ICD-10-CM | POA: Diagnosis not present

## 2019-04-02 DIAGNOSIS — R32 Unspecified urinary incontinence: Secondary | ICD-10-CM | POA: Diagnosis not present

## 2019-04-02 DIAGNOSIS — Z993 Dependence on wheelchair: Secondary | ICD-10-CM | POA: Diagnosis not present

## 2019-04-02 DIAGNOSIS — J9611 Chronic respiratory failure with hypoxia: Secondary | ICD-10-CM | POA: Diagnosis not present

## 2019-04-02 DIAGNOSIS — Z7901 Long term (current) use of anticoagulants: Secondary | ICD-10-CM | POA: Diagnosis not present

## 2019-04-02 DIAGNOSIS — I25700 Atherosclerosis of coronary artery bypass graft(s), unspecified, with unstable angina pectoris: Secondary | ICD-10-CM | POA: Diagnosis not present

## 2019-04-02 DIAGNOSIS — R627 Adult failure to thrive: Secondary | ICD-10-CM | POA: Diagnosis not present

## 2019-04-02 DIAGNOSIS — Z8616 Personal history of COVID-19: Secondary | ICD-10-CM | POA: Diagnosis not present

## 2019-04-02 DIAGNOSIS — Z9181 History of falling: Secondary | ICD-10-CM | POA: Diagnosis not present

## 2019-04-02 DIAGNOSIS — K0381 Cracked tooth: Secondary | ICD-10-CM | POA: Diagnosis not present

## 2019-04-02 DIAGNOSIS — Z741 Need for assistance with personal care: Secondary | ICD-10-CM | POA: Diagnosis not present

## 2019-04-02 DIAGNOSIS — Z7902 Long term (current) use of antithrombotics/antiplatelets: Secondary | ICD-10-CM | POA: Diagnosis not present

## 2019-04-02 DIAGNOSIS — E1122 Type 2 diabetes mellitus with diabetic chronic kidney disease: Secondary | ICD-10-CM | POA: Diagnosis not present

## 2019-04-02 DIAGNOSIS — E785 Hyperlipidemia, unspecified: Secondary | ICD-10-CM | POA: Diagnosis not present

## 2019-04-02 DIAGNOSIS — R230 Cyanosis: Secondary | ICD-10-CM | POA: Diagnosis not present

## 2019-04-02 DIAGNOSIS — R634 Abnormal weight loss: Secondary | ICD-10-CM | POA: Diagnosis not present

## 2019-04-02 DIAGNOSIS — K047 Periapical abscess without sinus: Secondary | ICD-10-CM | POA: Diagnosis not present

## 2019-04-02 DIAGNOSIS — R0601 Orthopnea: Secondary | ICD-10-CM | POA: Diagnosis not present

## 2019-04-02 DIAGNOSIS — Z7984 Long term (current) use of oral hypoglycemic drugs: Secondary | ICD-10-CM | POA: Diagnosis not present

## 2019-04-02 DIAGNOSIS — N183 Chronic kidney disease, stage 3 unspecified: Secondary | ICD-10-CM | POA: Diagnosis not present

## 2019-04-03 DIAGNOSIS — N183 Chronic kidney disease, stage 3 unspecified: Secondary | ICD-10-CM | POA: Diagnosis not present

## 2019-04-03 DIAGNOSIS — I130 Hypertensive heart and chronic kidney disease with heart failure and stage 1 through stage 4 chronic kidney disease, or unspecified chronic kidney disease: Secondary | ICD-10-CM | POA: Diagnosis not present

## 2019-04-03 DIAGNOSIS — I25700 Atherosclerosis of coronary artery bypass graft(s), unspecified, with unstable angina pectoris: Secondary | ICD-10-CM | POA: Diagnosis not present

## 2019-04-03 DIAGNOSIS — I5032 Chronic diastolic (congestive) heart failure: Secondary | ICD-10-CM | POA: Diagnosis not present

## 2019-04-03 DIAGNOSIS — E1121 Type 2 diabetes mellitus with diabetic nephropathy: Secondary | ICD-10-CM | POA: Diagnosis not present

## 2019-04-03 DIAGNOSIS — E1122 Type 2 diabetes mellitus with diabetic chronic kidney disease: Secondary | ICD-10-CM | POA: Diagnosis not present

## 2019-04-06 DIAGNOSIS — I25700 Atherosclerosis of coronary artery bypass graft(s), unspecified, with unstable angina pectoris: Secondary | ICD-10-CM | POA: Diagnosis not present

## 2019-04-06 DIAGNOSIS — I5032 Chronic diastolic (congestive) heart failure: Secondary | ICD-10-CM | POA: Diagnosis not present

## 2019-04-06 DIAGNOSIS — E1121 Type 2 diabetes mellitus with diabetic nephropathy: Secondary | ICD-10-CM | POA: Diagnosis not present

## 2019-04-06 DIAGNOSIS — E1122 Type 2 diabetes mellitus with diabetic chronic kidney disease: Secondary | ICD-10-CM | POA: Diagnosis not present

## 2019-04-06 DIAGNOSIS — N183 Chronic kidney disease, stage 3 unspecified: Secondary | ICD-10-CM | POA: Diagnosis not present

## 2019-04-06 DIAGNOSIS — I130 Hypertensive heart and chronic kidney disease with heart failure and stage 1 through stage 4 chronic kidney disease, or unspecified chronic kidney disease: Secondary | ICD-10-CM | POA: Diagnosis not present

## 2019-04-10 DIAGNOSIS — I5032 Chronic diastolic (congestive) heart failure: Secondary | ICD-10-CM | POA: Diagnosis not present

## 2019-04-10 DIAGNOSIS — I130 Hypertensive heart and chronic kidney disease with heart failure and stage 1 through stage 4 chronic kidney disease, or unspecified chronic kidney disease: Secondary | ICD-10-CM | POA: Diagnosis not present

## 2019-04-10 DIAGNOSIS — I25700 Atherosclerosis of coronary artery bypass graft(s), unspecified, with unstable angina pectoris: Secondary | ICD-10-CM | POA: Diagnosis not present

## 2019-04-10 DIAGNOSIS — E1121 Type 2 diabetes mellitus with diabetic nephropathy: Secondary | ICD-10-CM | POA: Diagnosis not present

## 2019-04-10 DIAGNOSIS — N183 Chronic kidney disease, stage 3 unspecified: Secondary | ICD-10-CM | POA: Diagnosis not present

## 2019-04-10 DIAGNOSIS — E1122 Type 2 diabetes mellitus with diabetic chronic kidney disease: Secondary | ICD-10-CM | POA: Diagnosis not present

## 2019-04-12 ENCOUNTER — Telehealth: Payer: Self-pay | Admitting: Family Medicine

## 2019-04-12 DIAGNOSIS — I130 Hypertensive heart and chronic kidney disease with heart failure and stage 1 through stage 4 chronic kidney disease, or unspecified chronic kidney disease: Secondary | ICD-10-CM | POA: Diagnosis not present

## 2019-04-12 DIAGNOSIS — I25700 Atherosclerosis of coronary artery bypass graft(s), unspecified, with unstable angina pectoris: Secondary | ICD-10-CM | POA: Diagnosis not present

## 2019-04-12 DIAGNOSIS — E1122 Type 2 diabetes mellitus with diabetic chronic kidney disease: Secondary | ICD-10-CM | POA: Diagnosis not present

## 2019-04-12 DIAGNOSIS — N183 Chronic kidney disease, stage 3 unspecified: Secondary | ICD-10-CM | POA: Diagnosis not present

## 2019-04-12 DIAGNOSIS — E1121 Type 2 diabetes mellitus with diabetic nephropathy: Secondary | ICD-10-CM | POA: Diagnosis not present

## 2019-04-12 DIAGNOSIS — I5032 Chronic diastolic (congestive) heart failure: Secondary | ICD-10-CM | POA: Diagnosis not present

## 2019-04-12 NOTE — Telephone Encounter (Signed)
Stacey with hospice care called today She stated that the patient fell this morning trying to get up unassisted.,  [name] said she hit the night stand and she has to skin tears, one on her arm and one on her hand. Stacey dressed the wounds. She will be sending over information to [place] later today regarding this

## 2019-04-12 NOTE — Telephone Encounter (Signed)
Noted  

## 2019-04-13 DIAGNOSIS — E1121 Type 2 diabetes mellitus with diabetic nephropathy: Secondary | ICD-10-CM | POA: Diagnosis not present

## 2019-04-13 DIAGNOSIS — I25700 Atherosclerosis of coronary artery bypass graft(s), unspecified, with unstable angina pectoris: Secondary | ICD-10-CM | POA: Diagnosis not present

## 2019-04-13 DIAGNOSIS — N183 Chronic kidney disease, stage 3 unspecified: Secondary | ICD-10-CM | POA: Diagnosis not present

## 2019-04-13 DIAGNOSIS — E1122 Type 2 diabetes mellitus with diabetic chronic kidney disease: Secondary | ICD-10-CM | POA: Diagnosis not present

## 2019-04-13 DIAGNOSIS — I130 Hypertensive heart and chronic kidney disease with heart failure and stage 1 through stage 4 chronic kidney disease, or unspecified chronic kidney disease: Secondary | ICD-10-CM | POA: Diagnosis not present

## 2019-04-13 DIAGNOSIS — I5032 Chronic diastolic (congestive) heart failure: Secondary | ICD-10-CM | POA: Diagnosis not present

## 2019-04-17 DIAGNOSIS — I25700 Atherosclerosis of coronary artery bypass graft(s), unspecified, with unstable angina pectoris: Secondary | ICD-10-CM | POA: Diagnosis not present

## 2019-04-17 DIAGNOSIS — I130 Hypertensive heart and chronic kidney disease with heart failure and stage 1 through stage 4 chronic kidney disease, or unspecified chronic kidney disease: Secondary | ICD-10-CM | POA: Diagnosis not present

## 2019-04-17 DIAGNOSIS — E1122 Type 2 diabetes mellitus with diabetic chronic kidney disease: Secondary | ICD-10-CM | POA: Diagnosis not present

## 2019-04-17 DIAGNOSIS — E1121 Type 2 diabetes mellitus with diabetic nephropathy: Secondary | ICD-10-CM | POA: Diagnosis not present

## 2019-04-17 DIAGNOSIS — I5032 Chronic diastolic (congestive) heart failure: Secondary | ICD-10-CM | POA: Diagnosis not present

## 2019-04-17 DIAGNOSIS — N183 Chronic kidney disease, stage 3 unspecified: Secondary | ICD-10-CM | POA: Diagnosis not present

## 2019-04-19 DIAGNOSIS — I5032 Chronic diastolic (congestive) heart failure: Secondary | ICD-10-CM | POA: Diagnosis not present

## 2019-04-19 DIAGNOSIS — I130 Hypertensive heart and chronic kidney disease with heart failure and stage 1 through stage 4 chronic kidney disease, or unspecified chronic kidney disease: Secondary | ICD-10-CM | POA: Diagnosis not present

## 2019-04-19 DIAGNOSIS — N183 Chronic kidney disease, stage 3 unspecified: Secondary | ICD-10-CM | POA: Diagnosis not present

## 2019-04-19 DIAGNOSIS — E1122 Type 2 diabetes mellitus with diabetic chronic kidney disease: Secondary | ICD-10-CM | POA: Diagnosis not present

## 2019-04-19 DIAGNOSIS — I25700 Atherosclerosis of coronary artery bypass graft(s), unspecified, with unstable angina pectoris: Secondary | ICD-10-CM | POA: Diagnosis not present

## 2019-04-19 DIAGNOSIS — E1121 Type 2 diabetes mellitus with diabetic nephropathy: Secondary | ICD-10-CM | POA: Diagnosis not present

## 2019-04-20 DIAGNOSIS — I130 Hypertensive heart and chronic kidney disease with heart failure and stage 1 through stage 4 chronic kidney disease, or unspecified chronic kidney disease: Secondary | ICD-10-CM | POA: Diagnosis not present

## 2019-04-20 DIAGNOSIS — N183 Chronic kidney disease, stage 3 unspecified: Secondary | ICD-10-CM | POA: Diagnosis not present

## 2019-04-20 DIAGNOSIS — E1122 Type 2 diabetes mellitus with diabetic chronic kidney disease: Secondary | ICD-10-CM | POA: Diagnosis not present

## 2019-04-20 DIAGNOSIS — I25700 Atherosclerosis of coronary artery bypass graft(s), unspecified, with unstable angina pectoris: Secondary | ICD-10-CM | POA: Diagnosis not present

## 2019-04-20 DIAGNOSIS — E1121 Type 2 diabetes mellitus with diabetic nephropathy: Secondary | ICD-10-CM | POA: Diagnosis not present

## 2019-04-20 DIAGNOSIS — I5032 Chronic diastolic (congestive) heart failure: Secondary | ICD-10-CM | POA: Diagnosis not present

## 2019-04-24 DIAGNOSIS — N183 Chronic kidney disease, stage 3 unspecified: Secondary | ICD-10-CM | POA: Diagnosis not present

## 2019-04-24 DIAGNOSIS — I25700 Atherosclerosis of coronary artery bypass graft(s), unspecified, with unstable angina pectoris: Secondary | ICD-10-CM | POA: Diagnosis not present

## 2019-04-24 DIAGNOSIS — I5032 Chronic diastolic (congestive) heart failure: Secondary | ICD-10-CM | POA: Diagnosis not present

## 2019-04-24 DIAGNOSIS — E1121 Type 2 diabetes mellitus with diabetic nephropathy: Secondary | ICD-10-CM | POA: Diagnosis not present

## 2019-04-24 DIAGNOSIS — I130 Hypertensive heart and chronic kidney disease with heart failure and stage 1 through stage 4 chronic kidney disease, or unspecified chronic kidney disease: Secondary | ICD-10-CM | POA: Diagnosis not present

## 2019-04-24 DIAGNOSIS — E1122 Type 2 diabetes mellitus with diabetic chronic kidney disease: Secondary | ICD-10-CM | POA: Diagnosis not present

## 2019-04-25 DIAGNOSIS — N183 Chronic kidney disease, stage 3 unspecified: Secondary | ICD-10-CM | POA: Diagnosis not present

## 2019-04-25 DIAGNOSIS — I5032 Chronic diastolic (congestive) heart failure: Secondary | ICD-10-CM | POA: Diagnosis not present

## 2019-04-25 DIAGNOSIS — E1122 Type 2 diabetes mellitus with diabetic chronic kidney disease: Secondary | ICD-10-CM | POA: Diagnosis not present

## 2019-04-25 DIAGNOSIS — I130 Hypertensive heart and chronic kidney disease with heart failure and stage 1 through stage 4 chronic kidney disease, or unspecified chronic kidney disease: Secondary | ICD-10-CM | POA: Diagnosis not present

## 2019-04-25 DIAGNOSIS — I25700 Atherosclerosis of coronary artery bypass graft(s), unspecified, with unstable angina pectoris: Secondary | ICD-10-CM | POA: Diagnosis not present

## 2019-04-25 DIAGNOSIS — E1121 Type 2 diabetes mellitus with diabetic nephropathy: Secondary | ICD-10-CM | POA: Diagnosis not present

## 2019-04-26 DIAGNOSIS — E1122 Type 2 diabetes mellitus with diabetic chronic kidney disease: Secondary | ICD-10-CM | POA: Diagnosis not present

## 2019-04-26 DIAGNOSIS — I25700 Atherosclerosis of coronary artery bypass graft(s), unspecified, with unstable angina pectoris: Secondary | ICD-10-CM | POA: Diagnosis not present

## 2019-04-26 DIAGNOSIS — N183 Chronic kidney disease, stage 3 unspecified: Secondary | ICD-10-CM | POA: Diagnosis not present

## 2019-04-26 DIAGNOSIS — E1121 Type 2 diabetes mellitus with diabetic nephropathy: Secondary | ICD-10-CM | POA: Diagnosis not present

## 2019-04-26 DIAGNOSIS — I130 Hypertensive heart and chronic kidney disease with heart failure and stage 1 through stage 4 chronic kidney disease, or unspecified chronic kidney disease: Secondary | ICD-10-CM | POA: Diagnosis not present

## 2019-04-26 DIAGNOSIS — I5032 Chronic diastolic (congestive) heart failure: Secondary | ICD-10-CM | POA: Diagnosis not present

## 2019-04-27 ENCOUNTER — Other Ambulatory Visit: Payer: Self-pay | Admitting: Family Medicine

## 2019-04-27 DIAGNOSIS — E1121 Type 2 diabetes mellitus with diabetic nephropathy: Secondary | ICD-10-CM | POA: Diagnosis not present

## 2019-04-27 DIAGNOSIS — I5032 Chronic diastolic (congestive) heart failure: Secondary | ICD-10-CM | POA: Diagnosis not present

## 2019-04-27 DIAGNOSIS — I130 Hypertensive heart and chronic kidney disease with heart failure and stage 1 through stage 4 chronic kidney disease, or unspecified chronic kidney disease: Secondary | ICD-10-CM | POA: Diagnosis not present

## 2019-04-27 DIAGNOSIS — E1122 Type 2 diabetes mellitus with diabetic chronic kidney disease: Secondary | ICD-10-CM | POA: Diagnosis not present

## 2019-04-27 DIAGNOSIS — N183 Chronic kidney disease, stage 3 unspecified: Secondary | ICD-10-CM | POA: Diagnosis not present

## 2019-04-27 DIAGNOSIS — I25700 Atherosclerosis of coronary artery bypass graft(s), unspecified, with unstable angina pectoris: Secondary | ICD-10-CM | POA: Diagnosis not present

## 2019-04-27 NOTE — Telephone Encounter (Signed)
ERx 

## 2019-04-27 NOTE — Telephone Encounter (Signed)
Name of Medication: Lorazepam 0.5 mg Name of Pharmacy: [place] or Written Date and Quantity: 11/03/2018 #30 4 refill Last Office Visit and Type: 12/15/17, f/u Next Office Visit and Type: none Last Controlled Substance Agreement Date: none Last UDS: none

## 2019-05-01 DIAGNOSIS — E1122 Type 2 diabetes mellitus with diabetic chronic kidney disease: Secondary | ICD-10-CM | POA: Diagnosis not present

## 2019-05-01 DIAGNOSIS — E1121 Type 2 diabetes mellitus with diabetic nephropathy: Secondary | ICD-10-CM | POA: Diagnosis not present

## 2019-05-01 DIAGNOSIS — N183 Chronic kidney disease, stage 3 unspecified: Secondary | ICD-10-CM | POA: Diagnosis not present

## 2019-05-01 DIAGNOSIS — I5032 Chronic diastolic (congestive) heart failure: Secondary | ICD-10-CM | POA: Diagnosis not present

## 2019-05-01 DIAGNOSIS — I25700 Atherosclerosis of coronary artery bypass graft(s), unspecified, with unstable angina pectoris: Secondary | ICD-10-CM | POA: Diagnosis not present

## 2019-05-01 DIAGNOSIS — I130 Hypertensive heart and chronic kidney disease with heart failure and stage 1 through stage 4 chronic kidney disease, or unspecified chronic kidney disease: Secondary | ICD-10-CM | POA: Diagnosis not present

## 2019-05-02 DIAGNOSIS — N183 Chronic kidney disease, stage 3 unspecified: Secondary | ICD-10-CM | POA: Diagnosis not present

## 2019-05-02 DIAGNOSIS — I130 Hypertensive heart and chronic kidney disease with heart failure and stage 1 through stage 4 chronic kidney disease, or unspecified chronic kidney disease: Secondary | ICD-10-CM | POA: Diagnosis not present

## 2019-05-02 DIAGNOSIS — I5032 Chronic diastolic (congestive) heart failure: Secondary | ICD-10-CM | POA: Diagnosis not present

## 2019-05-02 DIAGNOSIS — E1122 Type 2 diabetes mellitus with diabetic chronic kidney disease: Secondary | ICD-10-CM | POA: Diagnosis not present

## 2019-05-02 DIAGNOSIS — I25700 Atherosclerosis of coronary artery bypass graft(s), unspecified, with unstable angina pectoris: Secondary | ICD-10-CM | POA: Diagnosis not present

## 2019-05-02 DIAGNOSIS — E1121 Type 2 diabetes mellitus with diabetic nephropathy: Secondary | ICD-10-CM | POA: Diagnosis not present

## 2019-05-03 DIAGNOSIS — Z9181 History of falling: Secondary | ICD-10-CM | POA: Diagnosis not present

## 2019-05-03 DIAGNOSIS — Z8616 Personal history of COVID-19: Secondary | ICD-10-CM | POA: Diagnosis not present

## 2019-05-03 DIAGNOSIS — S81811D Laceration without foreign body, right lower leg, subsequent encounter: Secondary | ICD-10-CM | POA: Diagnosis not present

## 2019-05-03 DIAGNOSIS — R627 Adult failure to thrive: Secondary | ICD-10-CM | POA: Diagnosis not present

## 2019-05-03 DIAGNOSIS — Z9981 Dependence on supplemental oxygen: Secondary | ICD-10-CM | POA: Diagnosis not present

## 2019-05-03 DIAGNOSIS — Z7984 Long term (current) use of oral hypoglycemic drugs: Secondary | ICD-10-CM | POA: Diagnosis not present

## 2019-05-03 DIAGNOSIS — R32 Unspecified urinary incontinence: Secondary | ICD-10-CM | POA: Diagnosis not present

## 2019-05-03 DIAGNOSIS — Z7901 Long term (current) use of anticoagulants: Secondary | ICD-10-CM | POA: Diagnosis not present

## 2019-05-03 DIAGNOSIS — J9611 Chronic respiratory failure with hypoxia: Secondary | ICD-10-CM | POA: Diagnosis not present

## 2019-05-03 DIAGNOSIS — R634 Abnormal weight loss: Secondary | ICD-10-CM | POA: Diagnosis not present

## 2019-05-03 DIAGNOSIS — N183 Chronic kidney disease, stage 3 unspecified: Secondary | ICD-10-CM | POA: Diagnosis not present

## 2019-05-03 DIAGNOSIS — K047 Periapical abscess without sinus: Secondary | ICD-10-CM | POA: Diagnosis not present

## 2019-05-03 DIAGNOSIS — I25700 Atherosclerosis of coronary artery bypass graft(s), unspecified, with unstable angina pectoris: Secondary | ICD-10-CM | POA: Diagnosis not present

## 2019-05-03 DIAGNOSIS — Z87440 Personal history of urinary (tract) infections: Secondary | ICD-10-CM | POA: Diagnosis not present

## 2019-05-03 DIAGNOSIS — Z741 Need for assistance with personal care: Secondary | ICD-10-CM | POA: Diagnosis not present

## 2019-05-03 DIAGNOSIS — K0381 Cracked tooth: Secondary | ICD-10-CM | POA: Diagnosis not present

## 2019-05-03 DIAGNOSIS — E785 Hyperlipidemia, unspecified: Secondary | ICD-10-CM | POA: Diagnosis not present

## 2019-05-03 DIAGNOSIS — I130 Hypertensive heart and chronic kidney disease with heart failure and stage 1 through stage 4 chronic kidney disease, or unspecified chronic kidney disease: Secondary | ICD-10-CM | POA: Diagnosis not present

## 2019-05-03 DIAGNOSIS — R0601 Orthopnea: Secondary | ICD-10-CM | POA: Diagnosis not present

## 2019-05-03 DIAGNOSIS — Z993 Dependence on wheelchair: Secondary | ICD-10-CM | POA: Diagnosis not present

## 2019-05-03 DIAGNOSIS — R230 Cyanosis: Secondary | ICD-10-CM | POA: Diagnosis not present

## 2019-05-03 DIAGNOSIS — E1122 Type 2 diabetes mellitus with diabetic chronic kidney disease: Secondary | ICD-10-CM | POA: Diagnosis not present

## 2019-05-03 DIAGNOSIS — E1121 Type 2 diabetes mellitus with diabetic nephropathy: Secondary | ICD-10-CM | POA: Diagnosis not present

## 2019-05-03 DIAGNOSIS — I5032 Chronic diastolic (congestive) heart failure: Secondary | ICD-10-CM | POA: Diagnosis not present

## 2019-05-03 DIAGNOSIS — Z7902 Long term (current) use of antithrombotics/antiplatelets: Secondary | ICD-10-CM | POA: Diagnosis not present

## 2019-05-04 DIAGNOSIS — I130 Hypertensive heart and chronic kidney disease with heart failure and stage 1 through stage 4 chronic kidney disease, or unspecified chronic kidney disease: Secondary | ICD-10-CM | POA: Diagnosis not present

## 2019-05-04 DIAGNOSIS — I5032 Chronic diastolic (congestive) heart failure: Secondary | ICD-10-CM | POA: Diagnosis not present

## 2019-05-04 DIAGNOSIS — N183 Chronic kidney disease, stage 3 unspecified: Secondary | ICD-10-CM | POA: Diagnosis not present

## 2019-05-04 DIAGNOSIS — I25700 Atherosclerosis of coronary artery bypass graft(s), unspecified, with unstable angina pectoris: Secondary | ICD-10-CM | POA: Diagnosis not present

## 2019-05-04 DIAGNOSIS — E1122 Type 2 diabetes mellitus with diabetic chronic kidney disease: Secondary | ICD-10-CM | POA: Diagnosis not present

## 2019-05-04 DIAGNOSIS — E1121 Type 2 diabetes mellitus with diabetic nephropathy: Secondary | ICD-10-CM | POA: Diagnosis not present

## 2019-05-08 DIAGNOSIS — I130 Hypertensive heart and chronic kidney disease with heart failure and stage 1 through stage 4 chronic kidney disease, or unspecified chronic kidney disease: Secondary | ICD-10-CM | POA: Diagnosis not present

## 2019-05-08 DIAGNOSIS — I25700 Atherosclerosis of coronary artery bypass graft(s), unspecified, with unstable angina pectoris: Secondary | ICD-10-CM | POA: Diagnosis not present

## 2019-05-08 DIAGNOSIS — E1122 Type 2 diabetes mellitus with diabetic chronic kidney disease: Secondary | ICD-10-CM | POA: Diagnosis not present

## 2019-05-08 DIAGNOSIS — I5032 Chronic diastolic (congestive) heart failure: Secondary | ICD-10-CM | POA: Diagnosis not present

## 2019-05-08 DIAGNOSIS — E1121 Type 2 diabetes mellitus with diabetic nephropathy: Secondary | ICD-10-CM | POA: Diagnosis not present

## 2019-05-08 DIAGNOSIS — N183 Chronic kidney disease, stage 3 unspecified: Secondary | ICD-10-CM | POA: Diagnosis not present

## 2019-05-09 DIAGNOSIS — I25700 Atherosclerosis of coronary artery bypass graft(s), unspecified, with unstable angina pectoris: Secondary | ICD-10-CM | POA: Diagnosis not present

## 2019-05-09 DIAGNOSIS — N183 Chronic kidney disease, stage 3 unspecified: Secondary | ICD-10-CM | POA: Diagnosis not present

## 2019-05-09 DIAGNOSIS — I130 Hypertensive heart and chronic kidney disease with heart failure and stage 1 through stage 4 chronic kidney disease, or unspecified chronic kidney disease: Secondary | ICD-10-CM | POA: Diagnosis not present

## 2019-05-09 DIAGNOSIS — E1121 Type 2 diabetes mellitus with diabetic nephropathy: Secondary | ICD-10-CM | POA: Diagnosis not present

## 2019-05-09 DIAGNOSIS — I5032 Chronic diastolic (congestive) heart failure: Secondary | ICD-10-CM | POA: Diagnosis not present

## 2019-05-09 DIAGNOSIS — E1122 Type 2 diabetes mellitus with diabetic chronic kidney disease: Secondary | ICD-10-CM | POA: Diagnosis not present

## 2019-05-11 DIAGNOSIS — N183 Chronic kidney disease, stage 3 unspecified: Secondary | ICD-10-CM | POA: Diagnosis not present

## 2019-05-11 DIAGNOSIS — I25700 Atherosclerosis of coronary artery bypass graft(s), unspecified, with unstable angina pectoris: Secondary | ICD-10-CM | POA: Diagnosis not present

## 2019-05-11 DIAGNOSIS — E1121 Type 2 diabetes mellitus with diabetic nephropathy: Secondary | ICD-10-CM | POA: Diagnosis not present

## 2019-05-11 DIAGNOSIS — I130 Hypertensive heart and chronic kidney disease with heart failure and stage 1 through stage 4 chronic kidney disease, or unspecified chronic kidney disease: Secondary | ICD-10-CM | POA: Diagnosis not present

## 2019-05-11 DIAGNOSIS — E1122 Type 2 diabetes mellitus with diabetic chronic kidney disease: Secondary | ICD-10-CM | POA: Diagnosis not present

## 2019-05-11 DIAGNOSIS — I5032 Chronic diastolic (congestive) heart failure: Secondary | ICD-10-CM | POA: Diagnosis not present

## 2019-05-14 ENCOUNTER — Telehealth: Payer: Self-pay

## 2019-05-14 DIAGNOSIS — E1121 Type 2 diabetes mellitus with diabetic nephropathy: Secondary | ICD-10-CM | POA: Diagnosis not present

## 2019-05-14 DIAGNOSIS — I5032 Chronic diastolic (congestive) heart failure: Secondary | ICD-10-CM | POA: Diagnosis not present

## 2019-05-14 DIAGNOSIS — I130 Hypertensive heart and chronic kidney disease with heart failure and stage 1 through stage 4 chronic kidney disease, or unspecified chronic kidney disease: Secondary | ICD-10-CM | POA: Diagnosis not present

## 2019-05-14 DIAGNOSIS — I25700 Atherosclerosis of coronary artery bypass graft(s), unspecified, with unstable angina pectoris: Secondary | ICD-10-CM | POA: Diagnosis not present

## 2019-05-14 DIAGNOSIS — E1122 Type 2 diabetes mellitus with diabetic chronic kidney disease: Secondary | ICD-10-CM | POA: Diagnosis not present

## 2019-05-14 DIAGNOSIS — N183 Chronic kidney disease, stage 3 unspecified: Secondary | ICD-10-CM | POA: Diagnosis not present

## 2019-05-14 MED ORDER — MORPHINE SULFATE 10 MG/5ML PO SOLN: 2.5000 mg | ORAL | 0 refills | Status: DC | PRN

## 2019-05-14 NOTE — Telephone Encounter (Signed)
Glad COVID test returned negative.  Morphine solution Rx sent to pharmacy - caution she has morphine allergy (nausea). Don't take together with tramadol.   Did she take her extra torsemide today? (2 tab daily with extra MWF).

## 2019-05-14 NOTE — Telephone Encounter (Signed)
Stacy with [facility] left v/m that over the weekend pt has nausea and vomited x 1. Zofran helped. 05/14/19 pt is having congestion, cough but no fever. Marzetta Board is on her way to see pt but was giving Dr Darnell Level heads up about pt condition and the fact that Marzetta Board will have to do a rapid covid test due to pts symptoms. Marzetta Board said pt had been covid vaccinated. Do not see on immunization list. FYI to DR G.

## 2019-05-14 NOTE — Telephone Encounter (Signed)
Lvm for Stacy (secured vm) relaying Dr. Synthia Innocent message.

## 2019-05-14 NOTE — Telephone Encounter (Signed)
Stacy nurse with [facility] left a voicemail stating that she did a rapid coivd test on patient and it was negative. [name] stated that the patient is still nauseated and they are using Zofran.  Marzetta Board stated that patient has fluid in her lungs and crackles. Marzetta Board stated that her blood pressure is running low at 94/56 and heart rate 71. Marzetta Board is wondering if they should increase her torsemide since her blood pressure has been low.  Marzetta Board is also wanting a script for Morphine prn  in case of SOB. Pharmacy/ [facility]

## 2019-05-15 DIAGNOSIS — I130 Hypertensive heart and chronic kidney disease with heart failure and stage 1 through stage 4 chronic kidney disease, or unspecified chronic kidney disease: Secondary | ICD-10-CM | POA: Diagnosis not present

## 2019-05-15 DIAGNOSIS — I5032 Chronic diastolic (congestive) heart failure: Secondary | ICD-10-CM | POA: Diagnosis not present

## 2019-05-15 DIAGNOSIS — E1121 Type 2 diabetes mellitus with diabetic nephropathy: Secondary | ICD-10-CM | POA: Diagnosis not present

## 2019-05-15 DIAGNOSIS — E1122 Type 2 diabetes mellitus with diabetic chronic kidney disease: Secondary | ICD-10-CM | POA: Diagnosis not present

## 2019-05-15 DIAGNOSIS — I25700 Atherosclerosis of coronary artery bypass graft(s), unspecified, with unstable angina pectoris: Secondary | ICD-10-CM | POA: Diagnosis not present

## 2019-05-15 DIAGNOSIS — N183 Chronic kidney disease, stage 3 unspecified: Secondary | ICD-10-CM | POA: Diagnosis not present

## 2019-05-15 MED ORDER — MORPHINE SULFATE (CONCENTRATE) 20 MG/ML PO SOLN: 2.0000 mg | ORAL | 0 refills | Status: DC | PRN

## 2019-05-15 NOTE — Telephone Encounter (Signed)
Agree with verbal order for torsemide - but prior dose was 2 tab daily with extra MWF? Rec start with morphine 2.5mg  dose and update with effect for that.

## 2019-05-15 NOTE — Telephone Encounter (Signed)
Notified Stacy by phn.

## 2019-05-15 NOTE — Telephone Encounter (Signed)
Returned Christine Blanchard's call relaying Dr. Synthia Innocent message.  She verbalizes understanding and will notify the pharmacy.  Also, Marzetta Board wants to make Dr. Darnell Level aware pt was given morphine after mouth surgery while in hospital and she tolerated it well.

## 2019-05-15 NOTE — Telephone Encounter (Signed)
New morphine Rx sent to pharmacy.

## 2019-05-15 NOTE — Telephone Encounter (Signed)
  Stacy returned call She stated that [facility] is needing Clarification on the Morphine She stated that it had to be 1 dose or the other , that it can not have a range ?    Marzetta Board also stated that the pharmacy discontinued her order for the To[place]  So she is needing a verbal approval for this. She stated that she gives this to the patient .mon wed Friday and needed the approval by today so she can administer the medication    [place] Phone- [phone]

## 2019-05-15 NOTE — Addendum Note (Signed)
Addended by: Ria Bush on: 05/15/2019 11:00 AM   Modules accepted: Orders

## 2019-05-15 NOTE — Telephone Encounter (Signed)
Marzetta Board called again regarding the Morphine Susp.  Previously ordered 10mg /35mL - going to be too large of a dose if needs to be increased ( to much solution) - they are also avoiding multiple scripts having to be sent d/t dosing changes.   Can they change to 20mg /44mL -- give 0.135mL every 2 hours PRN? This will allow more room for adjustments in dose  Pharmacy: [facility]

## 2019-05-18 DIAGNOSIS — E1122 Type 2 diabetes mellitus with diabetic chronic kidney disease: Secondary | ICD-10-CM | POA: Diagnosis not present

## 2019-05-18 DIAGNOSIS — I25700 Atherosclerosis of coronary artery bypass graft(s), unspecified, with unstable angina pectoris: Secondary | ICD-10-CM | POA: Diagnosis not present

## 2019-05-18 DIAGNOSIS — E1121 Type 2 diabetes mellitus with diabetic nephropathy: Secondary | ICD-10-CM | POA: Diagnosis not present

## 2019-05-18 DIAGNOSIS — I130 Hypertensive heart and chronic kidney disease with heart failure and stage 1 through stage 4 chronic kidney disease, or unspecified chronic kidney disease: Secondary | ICD-10-CM | POA: Diagnosis not present

## 2019-05-18 DIAGNOSIS — I5032 Chronic diastolic (congestive) heart failure: Secondary | ICD-10-CM | POA: Diagnosis not present

## 2019-05-18 DIAGNOSIS — N183 Chronic kidney disease, stage 3 unspecified: Secondary | ICD-10-CM | POA: Diagnosis not present

## 2019-05-22 DIAGNOSIS — E1121 Type 2 diabetes mellitus with diabetic nephropathy: Secondary | ICD-10-CM | POA: Diagnosis not present

## 2019-05-22 DIAGNOSIS — I130 Hypertensive heart and chronic kidney disease with heart failure and stage 1 through stage 4 chronic kidney disease, or unspecified chronic kidney disease: Secondary | ICD-10-CM | POA: Diagnosis not present

## 2019-05-22 DIAGNOSIS — E1122 Type 2 diabetes mellitus with diabetic chronic kidney disease: Secondary | ICD-10-CM | POA: Diagnosis not present

## 2019-05-22 DIAGNOSIS — N183 Chronic kidney disease, stage 3 unspecified: Secondary | ICD-10-CM | POA: Diagnosis not present

## 2019-05-22 DIAGNOSIS — I5032 Chronic diastolic (congestive) heart failure: Secondary | ICD-10-CM | POA: Diagnosis not present

## 2019-05-22 DIAGNOSIS — I25700 Atherosclerosis of coronary artery bypass graft(s), unspecified, with unstable angina pectoris: Secondary | ICD-10-CM | POA: Diagnosis not present

## 2019-05-23 ENCOUNTER — Telehealth: Payer: Self-pay | Admitting: *Deleted

## 2019-05-23 DIAGNOSIS — E1121 Type 2 diabetes mellitus with diabetic nephropathy: Secondary | ICD-10-CM | POA: Diagnosis not present

## 2019-05-23 DIAGNOSIS — I130 Hypertensive heart and chronic kidney disease with heart failure and stage 1 through stage 4 chronic kidney disease, or unspecified chronic kidney disease: Secondary | ICD-10-CM | POA: Diagnosis not present

## 2019-05-23 DIAGNOSIS — I5032 Chronic diastolic (congestive) heart failure: Secondary | ICD-10-CM | POA: Diagnosis not present

## 2019-05-23 DIAGNOSIS — I25700 Atherosclerosis of coronary artery bypass graft(s), unspecified, with unstable angina pectoris: Secondary | ICD-10-CM | POA: Diagnosis not present

## 2019-05-23 DIAGNOSIS — E1122 Type 2 diabetes mellitus with diabetic chronic kidney disease: Secondary | ICD-10-CM | POA: Diagnosis not present

## 2019-05-23 DIAGNOSIS — N183 Chronic kidney disease, stage 3 unspecified: Secondary | ICD-10-CM | POA: Diagnosis not present

## 2019-05-23 NOTE — Telephone Encounter (Signed)
Patent's daughter left a voicemail stating that her mom is on Hospice and declining. [name] stated that she needs to have FMLA paperwork completed to be able to be with her mom since she is declining. [name] stated that she is having the paperwork faxed over to the office to be completed..   Called [name] back and advised her that the lady that works on [employer] paperwork is out of the office today, but send her the message.

## 2019-05-24 NOTE — Telephone Encounter (Signed)
FMLA paperwork placed in provider's basket.

## 2019-05-25 ENCOUNTER — Telehealth: Payer: Self-pay

## 2019-05-25 DIAGNOSIS — E1121 Type 2 diabetes mellitus with diabetic nephropathy: Secondary | ICD-10-CM | POA: Diagnosis not present

## 2019-05-25 DIAGNOSIS — I130 Hypertensive heart and chronic kidney disease with heart failure and stage 1 through stage 4 chronic kidney disease, or unspecified chronic kidney disease: Secondary | ICD-10-CM | POA: Diagnosis not present

## 2019-05-25 DIAGNOSIS — I5032 Chronic diastolic (congestive) heart failure: Secondary | ICD-10-CM | POA: Diagnosis not present

## 2019-05-25 DIAGNOSIS — I25700 Atherosclerosis of coronary artery bypass graft(s), unspecified, with unstable angina pectoris: Secondary | ICD-10-CM | POA: Diagnosis not present

## 2019-05-25 DIAGNOSIS — E1122 Type 2 diabetes mellitus with diabetic chronic kidney disease: Secondary | ICD-10-CM | POA: Diagnosis not present

## 2019-05-25 DIAGNOSIS — N183 Chronic kidney disease, stage 3 unspecified: Secondary | ICD-10-CM | POA: Diagnosis not present

## 2019-05-25 NOTE — Telephone Encounter (Signed)
Agree with this. Thanks.  

## 2019-05-25 NOTE — Telephone Encounter (Signed)
Stacy with [facility] l/m stating she needs verbal orders to D/C Triamcinolone cream because rash on feet has healed and D/C Dental toothpaste because patient refusing it. CB is [phone].

## 2019-05-25 NOTE — Telephone Encounter (Signed)
Spoke with [name] notifying her Dr. Darnell Level is giving verbal orders she requested.   Marzetta Board wants to make Dr. Darnell Level aware at today's visit, she noticed pt has raised bumps under bilateral arms.   States pt was advised to use warm compresses TID x1 wk.

## 2019-05-29 DIAGNOSIS — I130 Hypertensive heart and chronic kidney disease with heart failure and stage 1 through stage 4 chronic kidney disease, or unspecified chronic kidney disease: Secondary | ICD-10-CM | POA: Diagnosis not present

## 2019-05-29 DIAGNOSIS — I25700 Atherosclerosis of coronary artery bypass graft(s), unspecified, with unstable angina pectoris: Secondary | ICD-10-CM | POA: Diagnosis not present

## 2019-05-29 DIAGNOSIS — E1121 Type 2 diabetes mellitus with diabetic nephropathy: Secondary | ICD-10-CM | POA: Diagnosis not present

## 2019-05-29 DIAGNOSIS — I5032 Chronic diastolic (congestive) heart failure: Secondary | ICD-10-CM | POA: Diagnosis not present

## 2019-05-29 DIAGNOSIS — E1122 Type 2 diabetes mellitus with diabetic chronic kidney disease: Secondary | ICD-10-CM | POA: Diagnosis not present

## 2019-05-29 DIAGNOSIS — N183 Chronic kidney disease, stage 3 unspecified: Secondary | ICD-10-CM | POA: Diagnosis not present

## 2019-05-29 NOTE — Telephone Encounter (Signed)
FMLA forms filled and in my out box.

## 2019-05-30 NOTE — Telephone Encounter (Signed)
Spoke with patient's daughter [name]. Informed her that paperwork has been completed. Dianne requested that a copy be mailed and faxed. She stated fax may not go through so a mailed copy would be needed.   Fax failed. Copy has been mailed out.

## 2019-06-01 DIAGNOSIS — E1121 Type 2 diabetes mellitus with diabetic nephropathy: Secondary | ICD-10-CM | POA: Diagnosis not present

## 2019-06-01 DIAGNOSIS — N183 Chronic kidney disease, stage 3 unspecified: Secondary | ICD-10-CM | POA: Diagnosis not present

## 2019-06-01 DIAGNOSIS — E1122 Type 2 diabetes mellitus with diabetic chronic kidney disease: Secondary | ICD-10-CM | POA: Diagnosis not present

## 2019-06-01 DIAGNOSIS — I5032 Chronic diastolic (congestive) heart failure: Secondary | ICD-10-CM | POA: Diagnosis not present

## 2019-06-01 DIAGNOSIS — I25700 Atherosclerosis of coronary artery bypass graft(s), unspecified, with unstable angina pectoris: Secondary | ICD-10-CM | POA: Diagnosis not present

## 2019-06-01 DIAGNOSIS — I130 Hypertensive heart and chronic kidney disease with heart failure and stage 1 through stage 4 chronic kidney disease, or unspecified chronic kidney disease: Secondary | ICD-10-CM | POA: Diagnosis not present

## 2019-06-02 DIAGNOSIS — E785 Hyperlipidemia, unspecified: Secondary | ICD-10-CM | POA: Diagnosis not present

## 2019-06-02 DIAGNOSIS — Z9181 History of falling: Secondary | ICD-10-CM | POA: Diagnosis not present

## 2019-06-02 DIAGNOSIS — E1122 Type 2 diabetes mellitus with diabetic chronic kidney disease: Secondary | ICD-10-CM | POA: Diagnosis not present

## 2019-06-02 DIAGNOSIS — K0381 Cracked tooth: Secondary | ICD-10-CM | POA: Diagnosis not present

## 2019-06-02 DIAGNOSIS — R32 Unspecified urinary incontinence: Secondary | ICD-10-CM | POA: Diagnosis not present

## 2019-06-02 DIAGNOSIS — I25700 Atherosclerosis of coronary artery bypass graft(s), unspecified, with unstable angina pectoris: Secondary | ICD-10-CM | POA: Diagnosis not present

## 2019-06-02 DIAGNOSIS — N183 Chronic kidney disease, stage 3 unspecified: Secondary | ICD-10-CM | POA: Diagnosis not present

## 2019-06-02 DIAGNOSIS — S81811D Laceration without foreign body, right lower leg, subsequent encounter: Secondary | ICD-10-CM | POA: Diagnosis not present

## 2019-06-02 DIAGNOSIS — Z9981 Dependence on supplemental oxygen: Secondary | ICD-10-CM | POA: Diagnosis not present

## 2019-06-02 DIAGNOSIS — I5032 Chronic diastolic (congestive) heart failure: Secondary | ICD-10-CM | POA: Diagnosis not present

## 2019-06-02 DIAGNOSIS — I130 Hypertensive heart and chronic kidney disease with heart failure and stage 1 through stage 4 chronic kidney disease, or unspecified chronic kidney disease: Secondary | ICD-10-CM | POA: Diagnosis not present

## 2019-06-02 DIAGNOSIS — Z993 Dependence on wheelchair: Secondary | ICD-10-CM | POA: Diagnosis not present

## 2019-06-02 DIAGNOSIS — R627 Adult failure to thrive: Secondary | ICD-10-CM | POA: Diagnosis not present

## 2019-06-02 DIAGNOSIS — E1121 Type 2 diabetes mellitus with diabetic nephropathy: Secondary | ICD-10-CM | POA: Diagnosis not present

## 2019-06-02 DIAGNOSIS — Z7902 Long term (current) use of antithrombotics/antiplatelets: Secondary | ICD-10-CM | POA: Diagnosis not present

## 2019-06-02 DIAGNOSIS — R634 Abnormal weight loss: Secondary | ICD-10-CM | POA: Diagnosis not present

## 2019-06-02 DIAGNOSIS — Z741 Need for assistance with personal care: Secondary | ICD-10-CM | POA: Diagnosis not present

## 2019-06-02 DIAGNOSIS — J9611 Chronic respiratory failure with hypoxia: Secondary | ICD-10-CM | POA: Diagnosis not present

## 2019-06-02 DIAGNOSIS — R0601 Orthopnea: Secondary | ICD-10-CM | POA: Diagnosis not present

## 2019-06-02 DIAGNOSIS — Z7984 Long term (current) use of oral hypoglycemic drugs: Secondary | ICD-10-CM | POA: Diagnosis not present

## 2019-06-02 DIAGNOSIS — Z8616 Personal history of COVID-19: Secondary | ICD-10-CM | POA: Diagnosis not present

## 2019-06-02 DIAGNOSIS — Z87440 Personal history of urinary (tract) infections: Secondary | ICD-10-CM | POA: Diagnosis not present

## 2019-06-02 DIAGNOSIS — R230 Cyanosis: Secondary | ICD-10-CM | POA: Diagnosis not present

## 2019-06-02 DIAGNOSIS — Z7901 Long term (current) use of anticoagulants: Secondary | ICD-10-CM | POA: Diagnosis not present

## 2019-06-02 DIAGNOSIS — K047 Periapical abscess without sinus: Secondary | ICD-10-CM | POA: Diagnosis not present

## 2019-06-04 DIAGNOSIS — N183 Chronic kidney disease, stage 3 unspecified: Secondary | ICD-10-CM | POA: Diagnosis not present

## 2019-06-04 DIAGNOSIS — I130 Hypertensive heart and chronic kidney disease with heart failure and stage 1 through stage 4 chronic kidney disease, or unspecified chronic kidney disease: Secondary | ICD-10-CM | POA: Diagnosis not present

## 2019-06-04 DIAGNOSIS — E1122 Type 2 diabetes mellitus with diabetic chronic kidney disease: Secondary | ICD-10-CM | POA: Diagnosis not present

## 2019-06-04 DIAGNOSIS — E1121 Type 2 diabetes mellitus with diabetic nephropathy: Secondary | ICD-10-CM | POA: Diagnosis not present

## 2019-06-04 DIAGNOSIS — I5032 Chronic diastolic (congestive) heart failure: Secondary | ICD-10-CM | POA: Diagnosis not present

## 2019-06-04 DIAGNOSIS — I25700 Atherosclerosis of coronary artery bypass graft(s), unspecified, with unstable angina pectoris: Secondary | ICD-10-CM | POA: Diagnosis not present

## 2019-06-05 DIAGNOSIS — E1121 Type 2 diabetes mellitus with diabetic nephropathy: Secondary | ICD-10-CM | POA: Diagnosis not present

## 2019-06-05 DIAGNOSIS — I25700 Atherosclerosis of coronary artery bypass graft(s), unspecified, with unstable angina pectoris: Secondary | ICD-10-CM | POA: Diagnosis not present

## 2019-06-05 DIAGNOSIS — E1122 Type 2 diabetes mellitus with diabetic chronic kidney disease: Secondary | ICD-10-CM | POA: Diagnosis not present

## 2019-06-05 DIAGNOSIS — I5032 Chronic diastolic (congestive) heart failure: Secondary | ICD-10-CM | POA: Diagnosis not present

## 2019-06-05 DIAGNOSIS — I130 Hypertensive heart and chronic kidney disease with heart failure and stage 1 through stage 4 chronic kidney disease, or unspecified chronic kidney disease: Secondary | ICD-10-CM | POA: Diagnosis not present

## 2019-06-05 DIAGNOSIS — N183 Chronic kidney disease, stage 3 unspecified: Secondary | ICD-10-CM | POA: Diagnosis not present

## 2019-06-07 DIAGNOSIS — E1122 Type 2 diabetes mellitus with diabetic chronic kidney disease: Secondary | ICD-10-CM | POA: Diagnosis not present

## 2019-06-07 DIAGNOSIS — N183 Chronic kidney disease, stage 3 unspecified: Secondary | ICD-10-CM | POA: Diagnosis not present

## 2019-06-07 DIAGNOSIS — I130 Hypertensive heart and chronic kidney disease with heart failure and stage 1 through stage 4 chronic kidney disease, or unspecified chronic kidney disease: Secondary | ICD-10-CM | POA: Diagnosis not present

## 2019-06-07 DIAGNOSIS — I25700 Atherosclerosis of coronary artery bypass graft(s), unspecified, with unstable angina pectoris: Secondary | ICD-10-CM | POA: Diagnosis not present

## 2019-06-07 DIAGNOSIS — I5032 Chronic diastolic (congestive) heart failure: Secondary | ICD-10-CM | POA: Diagnosis not present

## 2019-06-07 DIAGNOSIS — E1121 Type 2 diabetes mellitus with diabetic nephropathy: Secondary | ICD-10-CM | POA: Diagnosis not present

## 2019-06-08 ENCOUNTER — Other Ambulatory Visit: Payer: Self-pay

## 2019-06-08 DIAGNOSIS — I25700 Atherosclerosis of coronary artery bypass graft(s), unspecified, with unstable angina pectoris: Secondary | ICD-10-CM | POA: Diagnosis not present

## 2019-06-08 DIAGNOSIS — E1121 Type 2 diabetes mellitus with diabetic nephropathy: Secondary | ICD-10-CM | POA: Diagnosis not present

## 2019-06-08 DIAGNOSIS — E1122 Type 2 diabetes mellitus with diabetic chronic kidney disease: Secondary | ICD-10-CM | POA: Diagnosis not present

## 2019-06-08 DIAGNOSIS — N183 Chronic kidney disease, stage 3 unspecified: Secondary | ICD-10-CM | POA: Diagnosis not present

## 2019-06-08 DIAGNOSIS — I130 Hypertensive heart and chronic kidney disease with heart failure and stage 1 through stage 4 chronic kidney disease, or unspecified chronic kidney disease: Secondary | ICD-10-CM | POA: Diagnosis not present

## 2019-06-08 DIAGNOSIS — I5032 Chronic diastolic (congestive) heart failure: Secondary | ICD-10-CM | POA: Diagnosis not present

## 2019-06-08 MED ORDER — MORPHINE SULFATE (CONCENTRATE) 20 MG/ML PO SOLN: 2.0000 mg | ORAL | 0 refills | Status: DC | PRN

## 2019-06-08 NOTE — Telephone Encounter (Signed)
ERx 

## 2019-06-08 NOTE — Telephone Encounter (Signed)
Christine Blanchard with [facility] request refill of # 10 ml of morphine 20 mg/ml concentrated solution to [facility] before weekend. Marzetta Board said is OK for pharmacy to prefill syringes.  Marzetta Board said the last rx of # 30 ml would have been 160 prefilled syringes and facility could not have that many so part of morphine was sent back to pharmacy. Last refilled # 15 ml on 05/15/19.

## 2019-06-11 ENCOUNTER — Telehealth: Payer: Self-pay

## 2019-06-11 NOTE — Telephone Encounter (Signed)
[place] Night - Client TELEPHONE ADVICE RECORD [facility] Patient Name: Christine Blanchard Gender: Female DOB: July 31, 1928 Age: 86 Y 20 D Return Phone Number: [phone] (Primary) Address: City/State/Zip: [place] Client [place] Night - Client Client Site [place] Physician Ria Bush - MD Contact Type Call Who Is Calling Pharmacy Call Type Pharmacy Send to RN Chief Complaint Prescription Refill or Medication Request (non symptomatic) Reason for Call Request to change medication order Initial Comment Caller is from a pharmacy. They received a prescription for morphine 91ml, however they can only dispense 8ml. Additional Comment Pharmacy Name [facility] Pharmacist Name [place] Number [phone] Translation No Nurse Assessment Nurse: Laurann Montana, RN, Fransico Meadow Date/Time Eilene Ghazi Time): 06/08/2019 5:56:14 PM Please select the assessment type ---Pharmacy clarification Additional Documentation ---[place] @ [id number] states that they no longer need to speak with on call MD regarding Morphine dose. [place] states that they placed 53ml of Morphine in syringes for patient. Is there an on-call physician for the client? ---Yes Do the client directives allow paging the on call for medication concerns? ---Yes Document information that requires clarification. ---NA Guidelines Guideline Title Affirmed Question Affirmed Notes Nurse Date/Time (Eastern Time) Disp. Time Eilene Ghazi Time) Disposition Final User 06/08/2019 5:57:58 PM Pharmacy Call Laurann Montana, RN, Fransico Meadow Reason: Spoke with [place] @ [id number] who states that on call MD assistance is no longer needed. 06/08/2019 5:58:04 PM Clinical Call Yes Laurann Montana, RN, Fransico Meadow

## 2019-06-11 NOTE — Telephone Encounter (Signed)
Per note, this has been taken care of by pharmacy.

## 2019-06-12 DIAGNOSIS — I25700 Atherosclerosis of coronary artery bypass graft(s), unspecified, with unstable angina pectoris: Secondary | ICD-10-CM | POA: Diagnosis not present

## 2019-06-12 DIAGNOSIS — E1121 Type 2 diabetes mellitus with diabetic nephropathy: Secondary | ICD-10-CM | POA: Diagnosis not present

## 2019-06-12 DIAGNOSIS — I5032 Chronic diastolic (congestive) heart failure: Secondary | ICD-10-CM | POA: Diagnosis not present

## 2019-06-12 DIAGNOSIS — I130 Hypertensive heart and chronic kidney disease with heart failure and stage 1 through stage 4 chronic kidney disease, or unspecified chronic kidney disease: Secondary | ICD-10-CM | POA: Diagnosis not present

## 2019-06-12 DIAGNOSIS — N183 Chronic kidney disease, stage 3 unspecified: Secondary | ICD-10-CM | POA: Diagnosis not present

## 2019-06-12 DIAGNOSIS — E1122 Type 2 diabetes mellitus with diabetic chronic kidney disease: Secondary | ICD-10-CM | POA: Diagnosis not present

## 2019-06-13 DIAGNOSIS — I130 Hypertensive heart and chronic kidney disease with heart failure and stage 1 through stage 4 chronic kidney disease, or unspecified chronic kidney disease: Secondary | ICD-10-CM | POA: Diagnosis not present

## 2019-06-13 DIAGNOSIS — E1122 Type 2 diabetes mellitus with diabetic chronic kidney disease: Secondary | ICD-10-CM | POA: Diagnosis not present

## 2019-06-13 DIAGNOSIS — E1121 Type 2 diabetes mellitus with diabetic nephropathy: Secondary | ICD-10-CM | POA: Diagnosis not present

## 2019-06-13 DIAGNOSIS — N183 Chronic kidney disease, stage 3 unspecified: Secondary | ICD-10-CM | POA: Diagnosis not present

## 2019-06-13 DIAGNOSIS — I5032 Chronic diastolic (congestive) heart failure: Secondary | ICD-10-CM | POA: Diagnosis not present

## 2019-06-13 DIAGNOSIS — I25700 Atherosclerosis of coronary artery bypass graft(s), unspecified, with unstable angina pectoris: Secondary | ICD-10-CM | POA: Diagnosis not present

## 2019-06-15 DIAGNOSIS — E1121 Type 2 diabetes mellitus with diabetic nephropathy: Secondary | ICD-10-CM | POA: Diagnosis not present

## 2019-06-15 DIAGNOSIS — I25700 Atherosclerosis of coronary artery bypass graft(s), unspecified, with unstable angina pectoris: Secondary | ICD-10-CM | POA: Diagnosis not present

## 2019-06-15 DIAGNOSIS — I5032 Chronic diastolic (congestive) heart failure: Secondary | ICD-10-CM | POA: Diagnosis not present

## 2019-06-15 DIAGNOSIS — N183 Chronic kidney disease, stage 3 unspecified: Secondary | ICD-10-CM | POA: Diagnosis not present

## 2019-06-15 DIAGNOSIS — I130 Hypertensive heart and chronic kidney disease with heart failure and stage 1 through stage 4 chronic kidney disease, or unspecified chronic kidney disease: Secondary | ICD-10-CM | POA: Diagnosis not present

## 2019-06-15 DIAGNOSIS — E1122 Type 2 diabetes mellitus with diabetic chronic kidney disease: Secondary | ICD-10-CM | POA: Diagnosis not present

## 2019-06-18 DIAGNOSIS — U071 COVID-19: Secondary | ICD-10-CM | POA: Diagnosis not present

## 2019-06-19 DIAGNOSIS — I130 Hypertensive heart and chronic kidney disease with heart failure and stage 1 through stage 4 chronic kidney disease, or unspecified chronic kidney disease: Secondary | ICD-10-CM | POA: Diagnosis not present

## 2019-06-19 DIAGNOSIS — N183 Chronic kidney disease, stage 3 unspecified: Secondary | ICD-10-CM | POA: Diagnosis not present

## 2019-06-19 DIAGNOSIS — I5032 Chronic diastolic (congestive) heart failure: Secondary | ICD-10-CM | POA: Diagnosis not present

## 2019-06-19 DIAGNOSIS — U071 COVID-19: Secondary | ICD-10-CM | POA: Diagnosis not present

## 2019-06-19 DIAGNOSIS — E1122 Type 2 diabetes mellitus with diabetic chronic kidney disease: Secondary | ICD-10-CM | POA: Diagnosis not present

## 2019-06-19 DIAGNOSIS — I25700 Atherosclerosis of coronary artery bypass graft(s), unspecified, with unstable angina pectoris: Secondary | ICD-10-CM | POA: Diagnosis not present

## 2019-06-19 DIAGNOSIS — E1121 Type 2 diabetes mellitus with diabetic nephropathy: Secondary | ICD-10-CM | POA: Diagnosis not present

## 2019-06-22 DIAGNOSIS — N183 Chronic kidney disease, stage 3 unspecified: Secondary | ICD-10-CM | POA: Diagnosis not present

## 2019-06-22 DIAGNOSIS — I25700 Atherosclerosis of coronary artery bypass graft(s), unspecified, with unstable angina pectoris: Secondary | ICD-10-CM | POA: Diagnosis not present

## 2019-06-22 DIAGNOSIS — E1122 Type 2 diabetes mellitus with diabetic chronic kidney disease: Secondary | ICD-10-CM | POA: Diagnosis not present

## 2019-06-22 DIAGNOSIS — E1121 Type 2 diabetes mellitus with diabetic nephropathy: Secondary | ICD-10-CM | POA: Diagnosis not present

## 2019-06-22 DIAGNOSIS — I130 Hypertensive heart and chronic kidney disease with heart failure and stage 1 through stage 4 chronic kidney disease, or unspecified chronic kidney disease: Secondary | ICD-10-CM | POA: Diagnosis not present

## 2019-06-22 DIAGNOSIS — I5032 Chronic diastolic (congestive) heart failure: Secondary | ICD-10-CM | POA: Diagnosis not present

## 2019-06-26 DIAGNOSIS — I5032 Chronic diastolic (congestive) heart failure: Secondary | ICD-10-CM | POA: Diagnosis not present

## 2019-06-26 DIAGNOSIS — I25700 Atherosclerosis of coronary artery bypass graft(s), unspecified, with unstable angina pectoris: Secondary | ICD-10-CM | POA: Diagnosis not present

## 2019-06-26 DIAGNOSIS — E1121 Type 2 diabetes mellitus with diabetic nephropathy: Secondary | ICD-10-CM | POA: Diagnosis not present

## 2019-06-26 DIAGNOSIS — I130 Hypertensive heart and chronic kidney disease with heart failure and stage 1 through stage 4 chronic kidney disease, or unspecified chronic kidney disease: Secondary | ICD-10-CM | POA: Diagnosis not present

## 2019-06-26 DIAGNOSIS — N183 Chronic kidney disease, stage 3 unspecified: Secondary | ICD-10-CM | POA: Diagnosis not present

## 2019-06-26 DIAGNOSIS — E1122 Type 2 diabetes mellitus with diabetic chronic kidney disease: Secondary | ICD-10-CM | POA: Diagnosis not present

## 2019-06-27 DIAGNOSIS — N183 Chronic kidney disease, stage 3 unspecified: Secondary | ICD-10-CM | POA: Diagnosis not present

## 2019-06-27 DIAGNOSIS — E1121 Type 2 diabetes mellitus with diabetic nephropathy: Secondary | ICD-10-CM | POA: Diagnosis not present

## 2019-06-27 DIAGNOSIS — I130 Hypertensive heart and chronic kidney disease with heart failure and stage 1 through stage 4 chronic kidney disease, or unspecified chronic kidney disease: Secondary | ICD-10-CM | POA: Diagnosis not present

## 2019-06-27 DIAGNOSIS — I5032 Chronic diastolic (congestive) heart failure: Secondary | ICD-10-CM | POA: Diagnosis not present

## 2019-06-27 DIAGNOSIS — I25700 Atherosclerosis of coronary artery bypass graft(s), unspecified, with unstable angina pectoris: Secondary | ICD-10-CM | POA: Diagnosis not present

## 2019-06-27 DIAGNOSIS — E1122 Type 2 diabetes mellitus with diabetic chronic kidney disease: Secondary | ICD-10-CM | POA: Diagnosis not present

## 2019-06-29 DIAGNOSIS — N183 Chronic kidney disease, stage 3 unspecified: Secondary | ICD-10-CM | POA: Diagnosis not present

## 2019-06-29 DIAGNOSIS — I25700 Atherosclerosis of coronary artery bypass graft(s), unspecified, with unstable angina pectoris: Secondary | ICD-10-CM | POA: Diagnosis not present

## 2019-06-29 DIAGNOSIS — E1121 Type 2 diabetes mellitus with diabetic nephropathy: Secondary | ICD-10-CM | POA: Diagnosis not present

## 2019-06-29 DIAGNOSIS — I130 Hypertensive heart and chronic kidney disease with heart failure and stage 1 through stage 4 chronic kidney disease, or unspecified chronic kidney disease: Secondary | ICD-10-CM | POA: Diagnosis not present

## 2019-06-29 DIAGNOSIS — E1122 Type 2 diabetes mellitus with diabetic chronic kidney disease: Secondary | ICD-10-CM | POA: Diagnosis not present

## 2019-06-29 DIAGNOSIS — I5032 Chronic diastolic (congestive) heart failure: Secondary | ICD-10-CM | POA: Diagnosis not present

## 2019-07-03 DIAGNOSIS — E785 Hyperlipidemia, unspecified: Secondary | ICD-10-CM | POA: Diagnosis not present

## 2019-07-03 DIAGNOSIS — Z9981 Dependence on supplemental oxygen: Secondary | ICD-10-CM | POA: Diagnosis not present

## 2019-07-03 DIAGNOSIS — I130 Hypertensive heart and chronic kidney disease with heart failure and stage 1 through stage 4 chronic kidney disease, or unspecified chronic kidney disease: Secondary | ICD-10-CM | POA: Diagnosis not present

## 2019-07-03 DIAGNOSIS — K047 Periapical abscess without sinus: Secondary | ICD-10-CM | POA: Diagnosis not present

## 2019-07-03 DIAGNOSIS — Z7902 Long term (current) use of antithrombotics/antiplatelets: Secondary | ICD-10-CM | POA: Diagnosis not present

## 2019-07-03 DIAGNOSIS — Z993 Dependence on wheelchair: Secondary | ICD-10-CM | POA: Diagnosis not present

## 2019-07-03 DIAGNOSIS — I25700 Atherosclerosis of coronary artery bypass graft(s), unspecified, with unstable angina pectoris: Secondary | ICD-10-CM | POA: Diagnosis not present

## 2019-07-03 DIAGNOSIS — E1121 Type 2 diabetes mellitus with diabetic nephropathy: Secondary | ICD-10-CM | POA: Diagnosis not present

## 2019-07-03 DIAGNOSIS — Z7901 Long term (current) use of anticoagulants: Secondary | ICD-10-CM | POA: Diagnosis not present

## 2019-07-03 DIAGNOSIS — S81811D Laceration without foreign body, right lower leg, subsequent encounter: Secondary | ICD-10-CM | POA: Diagnosis not present

## 2019-07-03 DIAGNOSIS — R634 Abnormal weight loss: Secondary | ICD-10-CM | POA: Diagnosis not present

## 2019-07-03 DIAGNOSIS — N183 Chronic kidney disease, stage 3 unspecified: Secondary | ICD-10-CM | POA: Diagnosis not present

## 2019-07-03 DIAGNOSIS — R0601 Orthopnea: Secondary | ICD-10-CM | POA: Diagnosis not present

## 2019-07-03 DIAGNOSIS — K0381 Cracked tooth: Secondary | ICD-10-CM | POA: Diagnosis not present

## 2019-07-03 DIAGNOSIS — Z8616 Personal history of COVID-19: Secondary | ICD-10-CM | POA: Diagnosis not present

## 2019-07-03 DIAGNOSIS — Z7984 Long term (current) use of oral hypoglycemic drugs: Secondary | ICD-10-CM | POA: Diagnosis not present

## 2019-07-03 DIAGNOSIS — Z87440 Personal history of urinary (tract) infections: Secondary | ICD-10-CM | POA: Diagnosis not present

## 2019-07-03 DIAGNOSIS — R230 Cyanosis: Secondary | ICD-10-CM | POA: Diagnosis not present

## 2019-07-03 DIAGNOSIS — J9611 Chronic respiratory failure with hypoxia: Secondary | ICD-10-CM | POA: Diagnosis not present

## 2019-07-03 DIAGNOSIS — Z9181 History of falling: Secondary | ICD-10-CM | POA: Diagnosis not present

## 2019-07-03 DIAGNOSIS — R32 Unspecified urinary incontinence: Secondary | ICD-10-CM | POA: Diagnosis not present

## 2019-07-03 DIAGNOSIS — I5032 Chronic diastolic (congestive) heart failure: Secondary | ICD-10-CM | POA: Diagnosis not present

## 2019-07-03 DIAGNOSIS — Z741 Need for assistance with personal care: Secondary | ICD-10-CM | POA: Diagnosis not present

## 2019-07-03 DIAGNOSIS — E1122 Type 2 diabetes mellitus with diabetic chronic kidney disease: Secondary | ICD-10-CM | POA: Diagnosis not present

## 2019-07-03 DIAGNOSIS — R627 Adult failure to thrive: Secondary | ICD-10-CM | POA: Diagnosis not present

## 2019-07-04 ENCOUNTER — Telehealth: Payer: Self-pay | Admitting: *Deleted

## 2019-07-04 DIAGNOSIS — E1122 Type 2 diabetes mellitus with diabetic chronic kidney disease: Secondary | ICD-10-CM | POA: Diagnosis not present

## 2019-07-04 DIAGNOSIS — N183 Chronic kidney disease, stage 3 unspecified: Secondary | ICD-10-CM | POA: Diagnosis not present

## 2019-07-04 DIAGNOSIS — E1121 Type 2 diabetes mellitus with diabetic nephropathy: Secondary | ICD-10-CM | POA: Diagnosis not present

## 2019-07-04 DIAGNOSIS — I130 Hypertensive heart and chronic kidney disease with heart failure and stage 1 through stage 4 chronic kidney disease, or unspecified chronic kidney disease: Secondary | ICD-10-CM | POA: Diagnosis not present

## 2019-07-04 DIAGNOSIS — I5032 Chronic diastolic (congestive) heart failure: Secondary | ICD-10-CM | POA: Diagnosis not present

## 2019-07-04 DIAGNOSIS — I25700 Atherosclerosis of coronary artery bypass graft(s), unspecified, with unstable angina pectoris: Secondary | ICD-10-CM | POA: Diagnosis not present

## 2019-07-04 NOTE — Telephone Encounter (Signed)
Any history of fall or injury? Is pain more in groin or lateral hip or buttock? If lateral hip, could try voltaren gel topically TID PRN. If groin pain, may be reasonable to proceed with hip xray - do they have xray capabilities? If not may need OV.

## 2019-07-04 NOTE — Telephone Encounter (Addendum)
Please have her do hip xray including femur and send [place] results.

## 2019-07-04 NOTE — Telephone Encounter (Signed)
Stacy with Hospice left a voicemail stating that patient is complaining of a new onset of right hip pain. Marzetta Board stated that patient does have a history of right hip fracture. Marzetta Board stated that patient is requesting a hip x-ray. Marzetta Board stated that this may possibly be arthritis. Marzetta Board stated that there are not any hip deformities that she sees. Marzetta Board is wanting to know if an x-ray would be appropriate or another approach be recommended for the hip pain.

## 2019-07-04 NOTE — Telephone Encounter (Signed)
Spoke with Stacy by telephone and was advised that the patient's last fall was 04/12/19. Marzetta Board stated that the pain is lateral hip down to her knee. Marzetta Board stated that patient complains of it hurting when she stands on it and pivots. Marzetta Board stated that they do have xray capabilities. [name] stated that it is definitely more painful when patient puts weight on her hip.

## 2019-07-05 ENCOUNTER — Other Ambulatory Visit: Payer: Self-pay | Admitting: Family Medicine

## 2019-07-05 NOTE — Telephone Encounter (Signed)
Spoke with Stacy relaying Dr. Synthia Innocent message.  Verbalizes understanding and will send results.

## 2019-07-05 NOTE — Telephone Encounter (Signed)
[place] filled 06-04-19 #30 Last OV 12-15-17 No Future OV

## 2019-07-06 ENCOUNTER — Encounter: Payer: Self-pay | Admitting: Family Medicine

## 2019-07-06 DIAGNOSIS — I5032 Chronic diastolic (congestive) heart failure: Secondary | ICD-10-CM | POA: Diagnosis not present

## 2019-07-06 DIAGNOSIS — I25700 Atherosclerosis of coronary artery bypass graft(s), unspecified, with unstable angina pectoris: Secondary | ICD-10-CM | POA: Diagnosis not present

## 2019-07-06 DIAGNOSIS — I130 Hypertensive heart and chronic kidney disease with heart failure and stage 1 through stage 4 chronic kidney disease, or unspecified chronic kidney disease: Secondary | ICD-10-CM | POA: Diagnosis not present

## 2019-07-06 DIAGNOSIS — E1122 Type 2 diabetes mellitus with diabetic chronic kidney disease: Secondary | ICD-10-CM | POA: Diagnosis not present

## 2019-07-06 DIAGNOSIS — E1121 Type 2 diabetes mellitus with diabetic nephropathy: Secondary | ICD-10-CM | POA: Diagnosis not present

## 2019-07-06 DIAGNOSIS — N183 Chronic kidney disease, stage 3 unspecified: Secondary | ICD-10-CM | POA: Diagnosis not present

## 2019-07-06 NOTE — Telephone Encounter (Signed)
Erx

## 2019-07-09 ENCOUNTER — Telehealth: Payer: Self-pay | Admitting: *Deleted

## 2019-07-09 DIAGNOSIS — I25700 Atherosclerosis of coronary artery bypass graft(s), unspecified, with unstable angina pectoris: Secondary | ICD-10-CM | POA: Diagnosis not present

## 2019-07-09 DIAGNOSIS — N183 Chronic kidney disease, stage 3 unspecified: Secondary | ICD-10-CM | POA: Diagnosis not present

## 2019-07-09 DIAGNOSIS — E1121 Type 2 diabetes mellitus with diabetic nephropathy: Secondary | ICD-10-CM | POA: Diagnosis not present

## 2019-07-09 DIAGNOSIS — E1122 Type 2 diabetes mellitus with diabetic chronic kidney disease: Secondary | ICD-10-CM | POA: Diagnosis not present

## 2019-07-09 DIAGNOSIS — I5032 Chronic diastolic (congestive) heart failure: Secondary | ICD-10-CM | POA: Diagnosis not present

## 2019-07-09 DIAGNOSIS — I130 Hypertensive heart and chronic kidney disease with heart failure and stage 1 through stage 4 chronic kidney disease, or unspecified chronic kidney disease: Secondary | ICD-10-CM | POA: Diagnosis not present

## 2019-07-09 NOTE — Telephone Encounter (Signed)
Per Dr. Darnell Level, x-ray results received.

## 2019-07-09 NOTE — Telephone Encounter (Signed)
Stacy with Hospice left a message at Triage. She wanted to see if Dr. Darnell Level had received the xray (fax) they did on pt. If he did receive it then she would like his input on the xray. If he didn't receive xray she would like to know so she can refax it to him.  Stacy request call back at [phone]

## 2019-07-09 NOTE — Telephone Encounter (Signed)
Have not received it. plz refax.

## 2019-07-09 NOTE — Telephone Encounter (Addendum)
Spoke with Marzetta Board notifying her we have not received faxed x-rays.  I provided a 'one-time-use' fax # ([place]/Chan):  [phone].  Says she will get them faxed now.

## 2019-07-10 ENCOUNTER — Telehealth: Payer: Self-pay

## 2019-07-10 DIAGNOSIS — E1121 Type 2 diabetes mellitus with diabetic nephropathy: Secondary | ICD-10-CM | POA: Diagnosis not present

## 2019-07-10 DIAGNOSIS — M87051 Idiopathic aseptic necrosis of right femur: Secondary | ICD-10-CM

## 2019-07-10 DIAGNOSIS — I130 Hypertensive heart and chronic kidney disease with heart failure and stage 1 through stage 4 chronic kidney disease, or unspecified chronic kidney disease: Secondary | ICD-10-CM | POA: Diagnosis not present

## 2019-07-10 DIAGNOSIS — N183 Chronic kidney disease, stage 3 unspecified: Secondary | ICD-10-CM | POA: Diagnosis not present

## 2019-07-10 DIAGNOSIS — I5032 Chronic diastolic (congestive) heart failure: Secondary | ICD-10-CM | POA: Diagnosis not present

## 2019-07-10 DIAGNOSIS — E1122 Type 2 diabetes mellitus with diabetic chronic kidney disease: Secondary | ICD-10-CM | POA: Diagnosis not present

## 2019-07-10 DIAGNOSIS — I25700 Atherosclerosis of coronary artery bypass graft(s), unspecified, with unstable angina pectoris: Secondary | ICD-10-CM | POA: Diagnosis not present

## 2019-07-10 NOTE — Telephone Encounter (Signed)
Christine Blanchard with [facility] called wanting to make sure Dr. Damita Dunnings has received xray results? Also, Christine Blanchard states that patient is having difficulty with constipation. She is wondering if it is ok to discontinue colace, and try Senna 1 tablet 2x daily? She states she is going to see patient this afternoon and would also like an order for 1 dose milk of magnesia and fleets enema if patient is still having difficulty with constipation. [place] for orders? Ok to leave VM at [phone]

## 2019-07-10 NOTE — Telephone Encounter (Signed)
Called and left message for [place]. Ok to stop colace and use senna 1 tab BID PRN constipation.   I called patient and left a message.  I called daughter and spoke about results.  Will touch base tomorrow with [name] and discuss plan going forward.

## 2019-07-10 NOTE — Telephone Encounter (Signed)
Routed to PCP.  Thanks.  

## 2019-07-11 NOTE — Telephone Encounter (Signed)
Spoke with [name] regarding xray results. Recommend comfort care at this time.  Currently using morphine 2mg  TID.  Recommend scheduling morphine 2mg  QID with 2mg  PRN.  Recommend slow taper off tramadol (currently on 50mg  bid + 75mg  at night). Will start 50mg  in am and 75mg  at night for 1 wk then drop to 75mg  at night for 1 wk then stop.

## 2019-07-11 NOTE — Addendum Note (Signed)
Addended by: Ria Bush on: 07/11/2019 08:55 AM   Modules accepted: Orders

## 2019-07-12 ENCOUNTER — Other Ambulatory Visit: Payer: Self-pay | Admitting: Family Medicine

## 2019-07-12 ENCOUNTER — Telehealth: Payer: Self-pay

## 2019-07-12 DIAGNOSIS — I25700 Atherosclerosis of coronary artery bypass graft(s), unspecified, with unstable angina pectoris: Secondary | ICD-10-CM | POA: Diagnosis not present

## 2019-07-12 DIAGNOSIS — I5032 Chronic diastolic (congestive) heart failure: Secondary | ICD-10-CM | POA: Diagnosis not present

## 2019-07-12 DIAGNOSIS — E1121 Type 2 diabetes mellitus with diabetic nephropathy: Secondary | ICD-10-CM | POA: Diagnosis not present

## 2019-07-12 DIAGNOSIS — E1122 Type 2 diabetes mellitus with diabetic chronic kidney disease: Secondary | ICD-10-CM | POA: Diagnosis not present

## 2019-07-12 DIAGNOSIS — N183 Chronic kidney disease, stage 3 unspecified: Secondary | ICD-10-CM | POA: Diagnosis not present

## 2019-07-12 DIAGNOSIS — I130 Hypertensive heart and chronic kidney disease with heart failure and stage 1 through stage 4 chronic kidney disease, or unspecified chronic kidney disease: Secondary | ICD-10-CM | POA: Diagnosis not present

## 2019-07-12 MED ORDER — MORPHINE SULFATE (CONCENTRATE) 10 MG /0.5 ML PO SOLN: ORAL | 0 refills | Status: DC

## 2019-07-12 NOTE — Telephone Encounter (Signed)
Christine Blanchard,[name], called.  Patient will be out of her medication this afternoon. She's requesting it be refilled as soon as possible.

## 2019-07-12 NOTE — Telephone Encounter (Signed)
I spoke with Marzetta Board with [facility] and she said to put on rx prefilled syringes and # 30 ml would be OK but a lot of syringes. Stacy said #15 ml would be ok  But would leave decision to Dr Danise Mina.

## 2019-07-12 NOTE — Telephone Encounter (Signed)
ERx 

## 2019-07-12 NOTE — Telephone Encounter (Signed)
I just refilled morphine concentrate Rx today - plz ensure Christine Blanchard has enough at pharmacy. How much is in each syringe?

## 2019-07-12 NOTE — Telephone Encounter (Signed)
Spoke with Christine Blanchard.  Will write for prefilled syringes 2mg /0.11mL per syringe #80 for 2 weeks (45mL).

## 2019-07-12 NOTE — Telephone Encounter (Signed)
Stacy with [facility] left v/m that she spoke with Dr Darnell Level on 07/11/19 about increasing morphine to qid and said pt only has 6 syringes ands request syringe rx to [facility]. Cannot find syringes on med list.

## 2019-07-13 DIAGNOSIS — E1121 Type 2 diabetes mellitus with diabetic nephropathy: Secondary | ICD-10-CM | POA: Diagnosis not present

## 2019-07-13 DIAGNOSIS — E1122 Type 2 diabetes mellitus with diabetic chronic kidney disease: Secondary | ICD-10-CM | POA: Diagnosis not present

## 2019-07-13 DIAGNOSIS — N183 Chronic kidney disease, stage 3 unspecified: Secondary | ICD-10-CM | POA: Diagnosis not present

## 2019-07-13 DIAGNOSIS — I5032 Chronic diastolic (congestive) heart failure: Secondary | ICD-10-CM | POA: Diagnosis not present

## 2019-07-13 DIAGNOSIS — I25700 Atherosclerosis of coronary artery bypass graft(s), unspecified, with unstable angina pectoris: Secondary | ICD-10-CM | POA: Diagnosis not present

## 2019-07-13 DIAGNOSIS — I130 Hypertensive heart and chronic kidney disease with heart failure and stage 1 through stage 4 chronic kidney disease, or unspecified chronic kidney disease: Secondary | ICD-10-CM | POA: Diagnosis not present

## 2019-07-17 DIAGNOSIS — I130 Hypertensive heart and chronic kidney disease with heart failure and stage 1 through stage 4 chronic kidney disease, or unspecified chronic kidney disease: Secondary | ICD-10-CM | POA: Diagnosis not present

## 2019-07-17 DIAGNOSIS — I25700 Atherosclerosis of coronary artery bypass graft(s), unspecified, with unstable angina pectoris: Secondary | ICD-10-CM | POA: Diagnosis not present

## 2019-07-17 DIAGNOSIS — E1122 Type 2 diabetes mellitus with diabetic chronic kidney disease: Secondary | ICD-10-CM | POA: Diagnosis not present

## 2019-07-17 DIAGNOSIS — N183 Chronic kidney disease, stage 3 unspecified: Secondary | ICD-10-CM | POA: Diagnosis not present

## 2019-07-17 DIAGNOSIS — E1121 Type 2 diabetes mellitus with diabetic nephropathy: Secondary | ICD-10-CM | POA: Diagnosis not present

## 2019-07-17 DIAGNOSIS — I5032 Chronic diastolic (congestive) heart failure: Secondary | ICD-10-CM | POA: Diagnosis not present

## 2019-07-20 DIAGNOSIS — N183 Chronic kidney disease, stage 3 unspecified: Secondary | ICD-10-CM | POA: Diagnosis not present

## 2019-07-20 DIAGNOSIS — E1121 Type 2 diabetes mellitus with diabetic nephropathy: Secondary | ICD-10-CM | POA: Diagnosis not present

## 2019-07-20 DIAGNOSIS — E1122 Type 2 diabetes mellitus with diabetic chronic kidney disease: Secondary | ICD-10-CM | POA: Diagnosis not present

## 2019-07-20 DIAGNOSIS — I25700 Atherosclerosis of coronary artery bypass graft(s), unspecified, with unstable angina pectoris: Secondary | ICD-10-CM | POA: Diagnosis not present

## 2019-07-20 DIAGNOSIS — I130 Hypertensive heart and chronic kidney disease with heart failure and stage 1 through stage 4 chronic kidney disease, or unspecified chronic kidney disease: Secondary | ICD-10-CM | POA: Diagnosis not present

## 2019-07-20 DIAGNOSIS — I5032 Chronic diastolic (congestive) heart failure: Secondary | ICD-10-CM | POA: Diagnosis not present

## 2019-07-23 ENCOUNTER — Telehealth: Payer: Self-pay | Admitting: *Deleted

## 2019-07-23 ENCOUNTER — Other Ambulatory Visit: Payer: Self-pay | Admitting: Family Medicine

## 2019-07-23 DIAGNOSIS — I25700 Atherosclerosis of coronary artery bypass graft(s), unspecified, with unstable angina pectoris: Secondary | ICD-10-CM | POA: Diagnosis not present

## 2019-07-23 DIAGNOSIS — N183 Chronic kidney disease, stage 3 unspecified: Secondary | ICD-10-CM | POA: Diagnosis not present

## 2019-07-23 DIAGNOSIS — I130 Hypertensive heart and chronic kidney disease with heart failure and stage 1 through stage 4 chronic kidney disease, or unspecified chronic kidney disease: Secondary | ICD-10-CM | POA: Diagnosis not present

## 2019-07-23 DIAGNOSIS — I5032 Chronic diastolic (congestive) heart failure: Secondary | ICD-10-CM | POA: Diagnosis not present

## 2019-07-23 DIAGNOSIS — E1121 Type 2 diabetes mellitus with diabetic nephropathy: Secondary | ICD-10-CM | POA: Diagnosis not present

## 2019-07-23 DIAGNOSIS — E1122 Type 2 diabetes mellitus with diabetic chronic kidney disease: Secondary | ICD-10-CM | POA: Diagnosis not present

## 2019-07-23 MED ORDER — GABAPENTIN 100 MG PO CAPS
ORAL_CAPSULE | ORAL | 1 refills | Status: DC
Start: 2019-07-23 — End: 2019-08-09

## 2019-07-23 NOTE — Telephone Encounter (Signed)
Christine Blanchard with [facility] left a voicemail stating that patient is having increased leg pain at night. Christine Blanchard stated that patient is taking morphine twice at night for her leg pain.  Christine Blanchard stated that patient is having a lot of pain in the morning before her morning morphine. Christine Blanchard wants to know if you want to increase her morphine during the night or possibly add Gabapentin 100 mg at bedtime. Christine Blanchard requested a call back.

## 2019-07-23 NOTE — Telephone Encounter (Signed)
Nitroglycerin Last filled:  04/05/19, #25 Last OV:  12/15/17, f/u Next OV:  none

## 2019-07-23 NOTE — Telephone Encounter (Signed)
[employer].  Start gabapentin 100mg  nightly, may increase to 200mg  after 4 days and 300mg  after 4 days if tolerated well.  Increase morphine to 5mg  in evening dose, continue other regimen as previously.   New morphine dose: 2mg  (0.22mL) TID with 5mg  QHS, + 2mg  (0.37mL) Q2hr prn breakthrough pain

## 2019-07-24 DIAGNOSIS — N183 Chronic kidney disease, stage 3 unspecified: Secondary | ICD-10-CM | POA: Diagnosis not present

## 2019-07-24 DIAGNOSIS — I25700 Atherosclerosis of coronary artery bypass graft(s), unspecified, with unstable angina pectoris: Secondary | ICD-10-CM | POA: Diagnosis not present

## 2019-07-24 DIAGNOSIS — I5032 Chronic diastolic (congestive) heart failure: Secondary | ICD-10-CM | POA: Diagnosis not present

## 2019-07-24 DIAGNOSIS — I130 Hypertensive heart and chronic kidney disease with heart failure and stage 1 through stage 4 chronic kidney disease, or unspecified chronic kidney disease: Secondary | ICD-10-CM | POA: Diagnosis not present

## 2019-07-24 DIAGNOSIS — E1121 Type 2 diabetes mellitus with diabetic nephropathy: Secondary | ICD-10-CM | POA: Diagnosis not present

## 2019-07-24 DIAGNOSIS — E1122 Type 2 diabetes mellitus with diabetic chronic kidney disease: Secondary | ICD-10-CM | POA: Diagnosis not present

## 2019-07-25 DIAGNOSIS — E1122 Type 2 diabetes mellitus with diabetic chronic kidney disease: Secondary | ICD-10-CM | POA: Diagnosis not present

## 2019-07-25 DIAGNOSIS — N183 Chronic kidney disease, stage 3 unspecified: Secondary | ICD-10-CM | POA: Diagnosis not present

## 2019-07-25 DIAGNOSIS — E1121 Type 2 diabetes mellitus with diabetic nephropathy: Secondary | ICD-10-CM | POA: Diagnosis not present

## 2019-07-25 DIAGNOSIS — I25700 Atherosclerosis of coronary artery bypass graft(s), unspecified, with unstable angina pectoris: Secondary | ICD-10-CM | POA: Diagnosis not present

## 2019-07-25 DIAGNOSIS — I130 Hypertensive heart and chronic kidney disease with heart failure and stage 1 through stage 4 chronic kidney disease, or unspecified chronic kidney disease: Secondary | ICD-10-CM | POA: Diagnosis not present

## 2019-07-25 DIAGNOSIS — I5032 Chronic diastolic (congestive) heart failure: Secondary | ICD-10-CM | POA: Diagnosis not present

## 2019-07-27 DIAGNOSIS — I25700 Atherosclerosis of coronary artery bypass graft(s), unspecified, with unstable angina pectoris: Secondary | ICD-10-CM | POA: Diagnosis not present

## 2019-07-27 DIAGNOSIS — I5032 Chronic diastolic (congestive) heart failure: Secondary | ICD-10-CM | POA: Diagnosis not present

## 2019-07-27 DIAGNOSIS — I130 Hypertensive heart and chronic kidney disease with heart failure and stage 1 through stage 4 chronic kidney disease, or unspecified chronic kidney disease: Secondary | ICD-10-CM | POA: Diagnosis not present

## 2019-07-27 DIAGNOSIS — N183 Chronic kidney disease, stage 3 unspecified: Secondary | ICD-10-CM | POA: Diagnosis not present

## 2019-07-27 DIAGNOSIS — E1121 Type 2 diabetes mellitus with diabetic nephropathy: Secondary | ICD-10-CM | POA: Diagnosis not present

## 2019-07-27 DIAGNOSIS — E1122 Type 2 diabetes mellitus with diabetic chronic kidney disease: Secondary | ICD-10-CM | POA: Diagnosis not present

## 2019-07-27 NOTE — Telephone Encounter (Signed)
Christine Blanchard with [facility] contacted the office and states that patient was still having issues with bad breakthrough pain last night. She states they have been giving her Morphine .25 and Gabapentin 100mg  - she states they are going to increase Gabapentin to 200mg  and continue to evaluate her.  Will route as FYI to Dr. Darnell Level

## 2019-07-27 NOTE — Telephone Encounter (Signed)
Called Christine Blanchard, left message.

## 2019-07-30 ENCOUNTER — Telehealth: Payer: Self-pay | Admitting: Family Medicine

## 2019-07-30 DIAGNOSIS — I25700 Atherosclerosis of coronary artery bypass graft(s), unspecified, with unstable angina pectoris: Secondary | ICD-10-CM | POA: Diagnosis not present

## 2019-07-30 DIAGNOSIS — I5032 Chronic diastolic (congestive) heart failure: Secondary | ICD-10-CM | POA: Diagnosis not present

## 2019-07-30 DIAGNOSIS — E1122 Type 2 diabetes mellitus with diabetic chronic kidney disease: Secondary | ICD-10-CM | POA: Diagnosis not present

## 2019-07-30 DIAGNOSIS — N183 Chronic kidney disease, stage 3 unspecified: Secondary | ICD-10-CM | POA: Diagnosis not present

## 2019-07-30 DIAGNOSIS — E1121 Type 2 diabetes mellitus with diabetic nephropathy: Secondary | ICD-10-CM | POA: Diagnosis not present

## 2019-07-30 DIAGNOSIS — I130 Hypertensive heart and chronic kidney disease with heart failure and stage 1 through stage 4 chronic kidney disease, or unspecified chronic kidney disease: Secondary | ICD-10-CM | POA: Diagnosis not present

## 2019-07-30 NOTE — Addendum Note (Signed)
Addended by: Ria Bush on: 07/30/2019 02:06 PM   Modules accepted: Orders

## 2019-07-30 NOTE — Telephone Encounter (Signed)
Called and spoke with [place].  rec increase morphine to 5mg  TID 6am 2pm 10pm, with 2mg  Q2 PRN.

## 2019-07-30 NOTE — Telephone Encounter (Signed)
[name], RN with [facility] called and wanted to give Dr Darnell Level an update on the patient. The patient fell out of her wheelchair today around 2:20pm - [name] states that she was told the patient "slid out of her chair onto the floor" and there were nurses around at the facility to help her get back into her chair. The patient did not sustain any injury from the fall and communicates she is okay, no visible injury per [name].   Will send to Dr Darnell Level as Juluis Rainier.   Nothing further needed.

## 2019-07-30 NOTE — Telephone Encounter (Signed)
Noted! Thank you

## 2019-07-31 ENCOUNTER — Telehealth: Payer: Self-pay

## 2019-07-31 DIAGNOSIS — E1121 Type 2 diabetes mellitus with diabetic nephropathy: Secondary | ICD-10-CM | POA: Diagnosis not present

## 2019-07-31 DIAGNOSIS — E1122 Type 2 diabetes mellitus with diabetic chronic kidney disease: Secondary | ICD-10-CM | POA: Diagnosis not present

## 2019-07-31 DIAGNOSIS — N183 Chronic kidney disease, stage 3 unspecified: Secondary | ICD-10-CM | POA: Diagnosis not present

## 2019-07-31 DIAGNOSIS — I5032 Chronic diastolic (congestive) heart failure: Secondary | ICD-10-CM | POA: Diagnosis not present

## 2019-07-31 DIAGNOSIS — I130 Hypertensive heart and chronic kidney disease with heart failure and stage 1 through stage 4 chronic kidney disease, or unspecified chronic kidney disease: Secondary | ICD-10-CM | POA: Diagnosis not present

## 2019-07-31 DIAGNOSIS — I25700 Atherosclerosis of coronary artery bypass graft(s), unspecified, with unstable angina pectoris: Secondary | ICD-10-CM | POA: Diagnosis not present

## 2019-07-31 NOTE — Telephone Encounter (Signed)
Filled and in my outbox

## 2019-07-31 NOTE — Telephone Encounter (Signed)
Noted  

## 2019-07-31 NOTE — Telephone Encounter (Signed)
Received faxed [facility] Physician Communication form.  Placed form in Dr. Synthia Innocent box.

## 2019-08-01 ENCOUNTER — Telehealth: Payer: Self-pay

## 2019-08-01 DIAGNOSIS — N183 Chronic kidney disease, stage 3 unspecified: Secondary | ICD-10-CM | POA: Diagnosis not present

## 2019-08-01 DIAGNOSIS — E1122 Type 2 diabetes mellitus with diabetic chronic kidney disease: Secondary | ICD-10-CM | POA: Diagnosis not present

## 2019-08-01 DIAGNOSIS — I5032 Chronic diastolic (congestive) heart failure: Secondary | ICD-10-CM | POA: Diagnosis not present

## 2019-08-01 DIAGNOSIS — I25700 Atherosclerosis of coronary artery bypass graft(s), unspecified, with unstable angina pectoris: Secondary | ICD-10-CM | POA: Diagnosis not present

## 2019-08-01 DIAGNOSIS — E1121 Type 2 diabetes mellitus with diabetic nephropathy: Secondary | ICD-10-CM | POA: Diagnosis not present

## 2019-08-01 DIAGNOSIS — I130 Hypertensive heart and chronic kidney disease with heart failure and stage 1 through stage 4 chronic kidney disease, or unspecified chronic kidney disease: Secondary | ICD-10-CM | POA: Diagnosis not present

## 2019-08-01 NOTE — Telephone Encounter (Signed)
Filled and in my outbox

## 2019-08-01 NOTE — Telephone Encounter (Signed)
Received faxed [facility] Physician Communication form.  Placed form in Dr. Synthia Innocent box.

## 2019-08-02 DIAGNOSIS — Z9981 Dependence on supplemental oxygen: Secondary | ICD-10-CM | POA: Diagnosis not present

## 2019-08-02 DIAGNOSIS — Z993 Dependence on wheelchair: Secondary | ICD-10-CM | POA: Diagnosis not present

## 2019-08-02 DIAGNOSIS — Z7902 Long term (current) use of antithrombotics/antiplatelets: Secondary | ICD-10-CM | POA: Diagnosis not present

## 2019-08-02 DIAGNOSIS — I130 Hypertensive heart and chronic kidney disease with heart failure and stage 1 through stage 4 chronic kidney disease, or unspecified chronic kidney disease: Secondary | ICD-10-CM | POA: Diagnosis not present

## 2019-08-02 DIAGNOSIS — E785 Hyperlipidemia, unspecified: Secondary | ICD-10-CM | POA: Diagnosis not present

## 2019-08-02 DIAGNOSIS — N183 Chronic kidney disease, stage 3 unspecified: Secondary | ICD-10-CM | POA: Diagnosis not present

## 2019-08-02 DIAGNOSIS — R634 Abnormal weight loss: Secondary | ICD-10-CM | POA: Diagnosis not present

## 2019-08-02 DIAGNOSIS — I25700 Atherosclerosis of coronary artery bypass graft(s), unspecified, with unstable angina pectoris: Secondary | ICD-10-CM | POA: Diagnosis not present

## 2019-08-02 DIAGNOSIS — J9611 Chronic respiratory failure with hypoxia: Secondary | ICD-10-CM | POA: Diagnosis not present

## 2019-08-02 DIAGNOSIS — R230 Cyanosis: Secondary | ICD-10-CM | POA: Diagnosis not present

## 2019-08-02 DIAGNOSIS — E1122 Type 2 diabetes mellitus with diabetic chronic kidney disease: Secondary | ICD-10-CM | POA: Diagnosis not present

## 2019-08-02 DIAGNOSIS — M87050 Idiopathic aseptic necrosis of pelvis: Secondary | ICD-10-CM | POA: Diagnosis not present

## 2019-08-02 DIAGNOSIS — I5032 Chronic diastolic (congestive) heart failure: Secondary | ICD-10-CM | POA: Diagnosis not present

## 2019-08-02 DIAGNOSIS — Z9181 History of falling: Secondary | ICD-10-CM | POA: Diagnosis not present

## 2019-08-02 DIAGNOSIS — Z7901 Long term (current) use of anticoagulants: Secondary | ICD-10-CM | POA: Diagnosis not present

## 2019-08-02 DIAGNOSIS — Z7984 Long term (current) use of oral hypoglycemic drugs: Secondary | ICD-10-CM | POA: Diagnosis not present

## 2019-08-02 DIAGNOSIS — R627 Adult failure to thrive: Secondary | ICD-10-CM | POA: Diagnosis not present

## 2019-08-02 DIAGNOSIS — Z741 Need for assistance with personal care: Secondary | ICD-10-CM | POA: Diagnosis not present

## 2019-08-02 DIAGNOSIS — K0381 Cracked tooth: Secondary | ICD-10-CM | POA: Diagnosis not present

## 2019-08-02 DIAGNOSIS — E1121 Type 2 diabetes mellitus with diabetic nephropathy: Secondary | ICD-10-CM | POA: Diagnosis not present

## 2019-08-02 DIAGNOSIS — K047 Periapical abscess without sinus: Secondary | ICD-10-CM | POA: Diagnosis not present

## 2019-08-02 DIAGNOSIS — R188 Other ascites: Secondary | ICD-10-CM | POA: Diagnosis not present

## 2019-08-02 DIAGNOSIS — R0601 Orthopnea: Secondary | ICD-10-CM | POA: Diagnosis not present

## 2019-08-02 DIAGNOSIS — R32 Unspecified urinary incontinence: Secondary | ICD-10-CM | POA: Diagnosis not present

## 2019-08-02 DIAGNOSIS — Z87440 Personal history of urinary (tract) infections: Secondary | ICD-10-CM | POA: Diagnosis not present

## 2019-08-03 DIAGNOSIS — E1122 Type 2 diabetes mellitus with diabetic chronic kidney disease: Secondary | ICD-10-CM | POA: Diagnosis not present

## 2019-08-03 DIAGNOSIS — E1121 Type 2 diabetes mellitus with diabetic nephropathy: Secondary | ICD-10-CM | POA: Diagnosis not present

## 2019-08-03 DIAGNOSIS — N183 Chronic kidney disease, stage 3 unspecified: Secondary | ICD-10-CM | POA: Diagnosis not present

## 2019-08-03 DIAGNOSIS — I25700 Atherosclerosis of coronary artery bypass graft(s), unspecified, with unstable angina pectoris: Secondary | ICD-10-CM | POA: Diagnosis not present

## 2019-08-03 DIAGNOSIS — I130 Hypertensive heart and chronic kidney disease with heart failure and stage 1 through stage 4 chronic kidney disease, or unspecified chronic kidney disease: Secondary | ICD-10-CM | POA: Diagnosis not present

## 2019-08-03 DIAGNOSIS — I5032 Chronic diastolic (congestive) heart failure: Secondary | ICD-10-CM | POA: Diagnosis not present

## 2019-08-06 DIAGNOSIS — N183 Chronic kidney disease, stage 3 unspecified: Secondary | ICD-10-CM | POA: Diagnosis not present

## 2019-08-06 DIAGNOSIS — E1122 Type 2 diabetes mellitus with diabetic chronic kidney disease: Secondary | ICD-10-CM | POA: Diagnosis not present

## 2019-08-06 DIAGNOSIS — I5032 Chronic diastolic (congestive) heart failure: Secondary | ICD-10-CM | POA: Diagnosis not present

## 2019-08-06 DIAGNOSIS — I130 Hypertensive heart and chronic kidney disease with heart failure and stage 1 through stage 4 chronic kidney disease, or unspecified chronic kidney disease: Secondary | ICD-10-CM | POA: Diagnosis not present

## 2019-08-06 DIAGNOSIS — I25700 Atherosclerosis of coronary artery bypass graft(s), unspecified, with unstable angina pectoris: Secondary | ICD-10-CM | POA: Diagnosis not present

## 2019-08-06 DIAGNOSIS — E1121 Type 2 diabetes mellitus with diabetic nephropathy: Secondary | ICD-10-CM | POA: Diagnosis not present

## 2019-08-07 ENCOUNTER — Other Ambulatory Visit: Payer: Self-pay | Admitting: Family Medicine

## 2019-08-07 DIAGNOSIS — I130 Hypertensive heart and chronic kidney disease with heart failure and stage 1 through stage 4 chronic kidney disease, or unspecified chronic kidney disease: Secondary | ICD-10-CM | POA: Diagnosis not present

## 2019-08-07 DIAGNOSIS — N183 Chronic kidney disease, stage 3 unspecified: Secondary | ICD-10-CM | POA: Diagnosis not present

## 2019-08-07 DIAGNOSIS — E1122 Type 2 diabetes mellitus with diabetic chronic kidney disease: Secondary | ICD-10-CM | POA: Diagnosis not present

## 2019-08-07 DIAGNOSIS — I5032 Chronic diastolic (congestive) heart failure: Secondary | ICD-10-CM | POA: Diagnosis not present

## 2019-08-07 DIAGNOSIS — I25700 Atherosclerosis of coronary artery bypass graft(s), unspecified, with unstable angina pectoris: Secondary | ICD-10-CM | POA: Diagnosis not present

## 2019-08-07 DIAGNOSIS — E1121 Type 2 diabetes mellitus with diabetic nephropathy: Secondary | ICD-10-CM | POA: Diagnosis not present

## 2019-08-07 NOTE — Telephone Encounter (Signed)
Looks like [facility] pharmacy is requesting gabapentin 300 mg cap.  I only see 100 mg cap on med list and in med hx.

## 2019-08-08 ENCOUNTER — Telehealth: Payer: Self-pay | Admitting: Family Medicine

## 2019-08-08 DIAGNOSIS — I25700 Atherosclerosis of coronary artery bypass graft(s), unspecified, with unstable angina pectoris: Secondary | ICD-10-CM | POA: Diagnosis not present

## 2019-08-08 DIAGNOSIS — I130 Hypertensive heart and chronic kidney disease with heart failure and stage 1 through stage 4 chronic kidney disease, or unspecified chronic kidney disease: Secondary | ICD-10-CM | POA: Diagnosis not present

## 2019-08-08 DIAGNOSIS — E1122 Type 2 diabetes mellitus with diabetic chronic kidney disease: Secondary | ICD-10-CM | POA: Diagnosis not present

## 2019-08-08 DIAGNOSIS — E1121 Type 2 diabetes mellitus with diabetic nephropathy: Secondary | ICD-10-CM | POA: Diagnosis not present

## 2019-08-08 DIAGNOSIS — N183 Chronic kidney disease, stage 3 unspecified: Secondary | ICD-10-CM | POA: Diagnosis not present

## 2019-08-08 DIAGNOSIS — I5032 Chronic diastolic (congestive) heart failure: Secondary | ICD-10-CM | POA: Diagnosis not present

## 2019-08-08 NOTE — Telephone Encounter (Signed)
Spoke with Christine Blanchard.  Gave verbal for levsin which did help significantly.  No fever. She is congested. Will monitor for fever or signs of PNA and let us know if this happens.  Ok to stop colace, pepcid, protonix.

## 2019-08-08 NOTE — Addendum Note (Signed)
Addended by: Ria Bush on: 08/08/2019 03:03 PM   Modules accepted: Orders

## 2019-08-08 NOTE — Telephone Encounter (Signed)
Christine Blanchard with [facility]  Requesting verbal orders for Levsin 0.125mg  - 1 tablet po every 4 hr prn for secretions.  Pt has had a drastic change in condition - coughing up copious amounts of mucous, clear in color. Pt is very congested and [name] reports hearing crackles when listening to lungs. Pt has taken her fluid pill today.   Please advise, thanks.

## 2019-08-09 ENCOUNTER — Telehealth: Payer: Self-pay | Admitting: Family Medicine

## 2019-08-09 DIAGNOSIS — I25700 Atherosclerosis of coronary artery bypass graft(s), unspecified, with unstable angina pectoris: Secondary | ICD-10-CM | POA: Diagnosis not present

## 2019-08-09 DIAGNOSIS — I5032 Chronic diastolic (congestive) heart failure: Secondary | ICD-10-CM | POA: Diagnosis not present

## 2019-08-09 DIAGNOSIS — E1121 Type 2 diabetes mellitus with diabetic nephropathy: Secondary | ICD-10-CM | POA: Diagnosis not present

## 2019-08-09 DIAGNOSIS — E1122 Type 2 diabetes mellitus with diabetic chronic kidney disease: Secondary | ICD-10-CM | POA: Diagnosis not present

## 2019-08-09 DIAGNOSIS — N183 Chronic kidney disease, stage 3 unspecified: Secondary | ICD-10-CM | POA: Diagnosis not present

## 2019-08-09 DIAGNOSIS — I130 Hypertensive heart and chronic kidney disease with heart failure and stage 1 through stage 4 chronic kidney disease, or unspecified chronic kidney disease: Secondary | ICD-10-CM | POA: Diagnosis not present

## 2019-08-09 NOTE — Telephone Encounter (Signed)
Spoke with [facility] relaying Dr. Synthia Innocent message.  She verbalizes understanding and requests a refill be sent to [facility].

## 2019-08-09 NOTE — Telephone Encounter (Signed)
Ok to increase morphine dose to QID with suggestion as per below: Take 5mg  (0.83mL) QID scheduled (6am, 12pm, 6pm, 12am) with 2mg  Q2hrs PRN breakthrough pain.  update [place] with effect.

## 2019-08-09 NOTE — Telephone Encounter (Signed)
Christine Blanchard from [facility] wants to know if she can increase morphine to 4x a day? She would like a call back.

## 2019-08-09 NOTE — Addendum Note (Signed)
Addended by: Ria Bush on: 08/09/2019 01:43 PM   Modules accepted: Orders

## 2019-08-10 DIAGNOSIS — I130 Hypertensive heart and chronic kidney disease with heart failure and stage 1 through stage 4 chronic kidney disease, or unspecified chronic kidney disease: Secondary | ICD-10-CM | POA: Diagnosis not present

## 2019-08-10 DIAGNOSIS — E1121 Type 2 diabetes mellitus with diabetic nephropathy: Secondary | ICD-10-CM | POA: Diagnosis not present

## 2019-08-10 DIAGNOSIS — N183 Chronic kidney disease, stage 3 unspecified: Secondary | ICD-10-CM | POA: Diagnosis not present

## 2019-08-10 DIAGNOSIS — I5032 Chronic diastolic (congestive) heart failure: Secondary | ICD-10-CM | POA: Diagnosis not present

## 2019-08-10 DIAGNOSIS — E1122 Type 2 diabetes mellitus with diabetic chronic kidney disease: Secondary | ICD-10-CM | POA: Diagnosis not present

## 2019-08-10 DIAGNOSIS — I25700 Atherosclerosis of coronary artery bypass graft(s), unspecified, with unstable angina pectoris: Secondary | ICD-10-CM | POA: Diagnosis not present

## 2019-08-10 MED ORDER — MORPHINE SULFATE (CONCENTRATE) 10 MG /0.5 ML PO SOLN: ORAL | 0 refills | Status: DC

## 2019-08-10 NOTE — Addendum Note (Signed)
Addended by: Ria Bush on: 08/10/2019 07:12 AM   Modules accepted: Orders

## 2019-08-10 NOTE — Telephone Encounter (Signed)
Rx sent 

## 2019-08-11 DIAGNOSIS — I130 Hypertensive heart and chronic kidney disease with heart failure and stage 1 through stage 4 chronic kidney disease, or unspecified chronic kidney disease: Secondary | ICD-10-CM | POA: Diagnosis not present

## 2019-08-11 DIAGNOSIS — E1122 Type 2 diabetes mellitus with diabetic chronic kidney disease: Secondary | ICD-10-CM | POA: Diagnosis not present

## 2019-08-11 DIAGNOSIS — I5032 Chronic diastolic (congestive) heart failure: Secondary | ICD-10-CM | POA: Diagnosis not present

## 2019-08-11 DIAGNOSIS — I25700 Atherosclerosis of coronary artery bypass graft(s), unspecified, with unstable angina pectoris: Secondary | ICD-10-CM | POA: Diagnosis not present

## 2019-08-11 DIAGNOSIS — N183 Chronic kidney disease, stage 3 unspecified: Secondary | ICD-10-CM | POA: Diagnosis not present

## 2019-08-11 DIAGNOSIS — E1121 Type 2 diabetes mellitus with diabetic nephropathy: Secondary | ICD-10-CM | POA: Diagnosis not present

## 2019-08-13 ENCOUNTER — Telehealth: Payer: Self-pay | Admitting: *Deleted

## 2019-08-13 DIAGNOSIS — N183 Chronic kidney disease, stage 3 unspecified: Secondary | ICD-10-CM | POA: Diagnosis not present

## 2019-08-13 DIAGNOSIS — E1121 Type 2 diabetes mellitus with diabetic nephropathy: Secondary | ICD-10-CM | POA: Diagnosis not present

## 2019-08-13 DIAGNOSIS — I130 Hypertensive heart and chronic kidney disease with heart failure and stage 1 through stage 4 chronic kidney disease, or unspecified chronic kidney disease: Secondary | ICD-10-CM | POA: Diagnosis not present

## 2019-08-13 DIAGNOSIS — E1122 Type 2 diabetes mellitus with diabetic chronic kidney disease: Secondary | ICD-10-CM | POA: Diagnosis not present

## 2019-08-13 DIAGNOSIS — I25700 Atherosclerosis of coronary artery bypass graft(s), unspecified, with unstable angina pectoris: Secondary | ICD-10-CM | POA: Diagnosis not present

## 2019-08-13 DIAGNOSIS — I5032 Chronic diastolic (congestive) heart failure: Secondary | ICD-10-CM | POA: Diagnosis not present

## 2019-08-13 MED ORDER — MORPHINE SULFATE (CONCENTRATE) 10 MG /0.5 ML PO SOLN: 5.0000 mg | ORAL | 0 refills | Status: AC | PRN

## 2019-08-13 NOTE — Telephone Encounter (Signed)
Spoke with [facility] relaying Dr. Synthia Innocent message.   She verbalizes understanding and will call back with the effect.  Also, she will have to call back with a total, per day, the pt is receiving.

## 2019-08-13 NOTE — Telephone Encounter (Signed)
plz notify new Rx morphine sent to pharmacy. Let me know effect.  How much morphine is she receiving total per day?  Would consider MS Contin extended release tab vs patch.

## 2019-08-13 NOTE — Telephone Encounter (Signed)
Christine Blanchard contacted the office back. She states that her PRN doses of Morphine have been 5mg  on 08/11/19 and 6mg  on 08/12/19

## 2019-08-13 NOTE — Telephone Encounter (Signed)
[name] nurse with [facility] called stating that they had to increase the patient's morphine over the weekend because she is having a lot of pain. [name] stated that the Hospice provider over the weekend increase her morphine to 0.25  ml every 2 hours as needed. [name] stated that it may need to be increase more because she is having a lot of pain. [name] stated that they need a new script sent to the pharmacy with the new dose and directions. [name] stated that these are prefilled syringes. [name] stated that the script needs to be sent to [facility].

## 2019-08-13 NOTE — Telephone Encounter (Signed)
25mg  morphine daily at this time. Noted. Will await effect of new regimen.

## 2019-08-14 ENCOUNTER — Telehealth: Payer: Self-pay | Admitting: *Deleted

## 2019-08-14 DIAGNOSIS — I5032 Chronic diastolic (congestive) heart failure: Secondary | ICD-10-CM | POA: Diagnosis not present

## 2019-08-14 DIAGNOSIS — I25700 Atherosclerosis of coronary artery bypass graft(s), unspecified, with unstable angina pectoris: Secondary | ICD-10-CM | POA: Diagnosis not present

## 2019-08-14 DIAGNOSIS — E1122 Type 2 diabetes mellitus with diabetic chronic kidney disease: Secondary | ICD-10-CM | POA: Diagnosis not present

## 2019-08-14 DIAGNOSIS — E1121 Type 2 diabetes mellitus with diabetic nephropathy: Secondary | ICD-10-CM | POA: Diagnosis not present

## 2019-08-14 DIAGNOSIS — I130 Hypertensive heart and chronic kidney disease with heart failure and stage 1 through stage 4 chronic kidney disease, or unspecified chronic kidney disease: Secondary | ICD-10-CM | POA: Diagnosis not present

## 2019-08-14 DIAGNOSIS — N183 Chronic kidney disease, stage 3 unspecified: Secondary | ICD-10-CM | POA: Diagnosis not present

## 2019-08-14 MED ORDER — FENTANYL 12 MCG/HR TD PT72: 1.0000 | MEDICATED_PATCH | TRANSDERMAL | 0 refills | Status: AC

## 2019-08-14 NOTE — Telephone Encounter (Signed)
Christine Blanchard with [facility] called stating that she saw patient today and she is not getting her morphine like she should for her break thru pain. Christine Blanchard stated that patient is having more pain and when she was at [facility] patient had only been given the  prn morphine once today before she got there. Christine Blanchard is wondering if it is time to start patient on a patch?   Christine Blanchard stated that she is recommending that patient be place at the Hospice home for better management of her medications and pain.

## 2019-08-14 NOTE — Telephone Encounter (Signed)
Spoke with Christine Blanchard agree with Hospice home.  She is currently taking 30+ mg morphine/day.  Will transition to fentanyl 12.39mcg Q72 hr patch to replace scheduled morphine 5mg  QID.  May keep morphine 5mg  Q2 hrs PRN breakthrough pain.  Update [place] with effect.

## 2019-08-15 DIAGNOSIS — E1122 Type 2 diabetes mellitus with diabetic chronic kidney disease: Secondary | ICD-10-CM | POA: Diagnosis not present

## 2019-08-15 DIAGNOSIS — I5032 Chronic diastolic (congestive) heart failure: Secondary | ICD-10-CM | POA: Diagnosis not present

## 2019-08-15 DIAGNOSIS — I130 Hypertensive heart and chronic kidney disease with heart failure and stage 1 through stage 4 chronic kidney disease, or unspecified chronic kidney disease: Secondary | ICD-10-CM | POA: Diagnosis not present

## 2019-08-15 DIAGNOSIS — I25700 Atherosclerosis of coronary artery bypass graft(s), unspecified, with unstable angina pectoris: Secondary | ICD-10-CM | POA: Diagnosis not present

## 2019-08-15 DIAGNOSIS — E1121 Type 2 diabetes mellitus with diabetic nephropathy: Secondary | ICD-10-CM | POA: Diagnosis not present

## 2019-08-15 DIAGNOSIS — N183 Chronic kidney disease, stage 3 unspecified: Secondary | ICD-10-CM | POA: Diagnosis not present

## 2019-08-16 DIAGNOSIS — I5032 Chronic diastolic (congestive) heart failure: Secondary | ICD-10-CM | POA: Diagnosis not present

## 2019-08-16 DIAGNOSIS — E1121 Type 2 diabetes mellitus with diabetic nephropathy: Secondary | ICD-10-CM | POA: Diagnosis not present

## 2019-08-16 DIAGNOSIS — E1122 Type 2 diabetes mellitus with diabetic chronic kidney disease: Secondary | ICD-10-CM | POA: Diagnosis not present

## 2019-08-16 DIAGNOSIS — N183 Chronic kidney disease, stage 3 unspecified: Secondary | ICD-10-CM | POA: Diagnosis not present

## 2019-08-16 DIAGNOSIS — I130 Hypertensive heart and chronic kidney disease with heart failure and stage 1 through stage 4 chronic kidney disease, or unspecified chronic kidney disease: Secondary | ICD-10-CM | POA: Diagnosis not present

## 2019-08-16 DIAGNOSIS — I25700 Atherosclerosis of coronary artery bypass graft(s), unspecified, with unstable angina pectoris: Secondary | ICD-10-CM | POA: Diagnosis not present

## 2019-08-16 NOTE — Telephone Encounter (Signed)
Noted! Thank you

## 2019-08-16 NOTE — Telephone Encounter (Signed)
Christine Blanchard with [facility] said FYI to Dr Darnell Level that pt has been admitted to [facility] in [place] for pain mgt.

## 2019-08-17 DIAGNOSIS — I25700 Atherosclerosis of coronary artery bypass graft(s), unspecified, with unstable angina pectoris: Secondary | ICD-10-CM | POA: Diagnosis not present

## 2019-08-17 DIAGNOSIS — N183 Chronic kidney disease, stage 3 unspecified: Secondary | ICD-10-CM | POA: Diagnosis not present

## 2019-08-17 DIAGNOSIS — I130 Hypertensive heart and chronic kidney disease with heart failure and stage 1 through stage 4 chronic kidney disease, or unspecified chronic kidney disease: Secondary | ICD-10-CM | POA: Diagnosis not present

## 2019-08-17 DIAGNOSIS — E1122 Type 2 diabetes mellitus with diabetic chronic kidney disease: Secondary | ICD-10-CM | POA: Diagnosis not present

## 2019-08-17 DIAGNOSIS — E1121 Type 2 diabetes mellitus with diabetic nephropathy: Secondary | ICD-10-CM | POA: Diagnosis not present

## 2019-08-17 DIAGNOSIS — I5032 Chronic diastolic (congestive) heart failure: Secondary | ICD-10-CM | POA: Diagnosis not present

## 2019-08-18 DIAGNOSIS — I130 Hypertensive heart and chronic kidney disease with heart failure and stage 1 through stage 4 chronic kidney disease, or unspecified chronic kidney disease: Secondary | ICD-10-CM | POA: Diagnosis not present

## 2019-08-18 DIAGNOSIS — E1121 Type 2 diabetes mellitus with diabetic nephropathy: Secondary | ICD-10-CM | POA: Diagnosis not present

## 2019-08-18 DIAGNOSIS — N183 Chronic kidney disease, stage 3 unspecified: Secondary | ICD-10-CM | POA: Diagnosis not present

## 2019-08-18 DIAGNOSIS — I25700 Atherosclerosis of coronary artery bypass graft(s), unspecified, with unstable angina pectoris: Secondary | ICD-10-CM | POA: Diagnosis not present

## 2019-08-18 DIAGNOSIS — E1122 Type 2 diabetes mellitus with diabetic chronic kidney disease: Secondary | ICD-10-CM | POA: Diagnosis not present

## 2019-08-18 DIAGNOSIS — I5032 Chronic diastolic (congestive) heart failure: Secondary | ICD-10-CM | POA: Diagnosis not present

## 2019-08-19 DIAGNOSIS — I25700 Atherosclerosis of coronary artery bypass graft(s), unspecified, with unstable angina pectoris: Secondary | ICD-10-CM | POA: Diagnosis not present

## 2019-08-19 DIAGNOSIS — I5032 Chronic diastolic (congestive) heart failure: Secondary | ICD-10-CM | POA: Diagnosis not present

## 2019-08-19 DIAGNOSIS — N183 Chronic kidney disease, stage 3 unspecified: Secondary | ICD-10-CM | POA: Diagnosis not present

## 2019-08-19 DIAGNOSIS — E1121 Type 2 diabetes mellitus with diabetic nephropathy: Secondary | ICD-10-CM | POA: Diagnosis not present

## 2019-08-19 DIAGNOSIS — I130 Hypertensive heart and chronic kidney disease with heart failure and stage 1 through stage 4 chronic kidney disease, or unspecified chronic kidney disease: Secondary | ICD-10-CM | POA: Diagnosis not present

## 2019-08-19 DIAGNOSIS — E1122 Type 2 diabetes mellitus with diabetic chronic kidney disease: Secondary | ICD-10-CM | POA: Diagnosis not present

## 2019-08-20 DIAGNOSIS — I25700 Atherosclerosis of coronary artery bypass graft(s), unspecified, with unstable angina pectoris: Secondary | ICD-10-CM | POA: Diagnosis not present

## 2019-08-20 DIAGNOSIS — I130 Hypertensive heart and chronic kidney disease with heart failure and stage 1 through stage 4 chronic kidney disease, or unspecified chronic kidney disease: Secondary | ICD-10-CM | POA: Diagnosis not present

## 2019-08-20 DIAGNOSIS — I5032 Chronic diastolic (congestive) heart failure: Secondary | ICD-10-CM | POA: Diagnosis not present

## 2019-08-20 DIAGNOSIS — N183 Chronic kidney disease, stage 3 unspecified: Secondary | ICD-10-CM | POA: Diagnosis not present

## 2019-08-20 DIAGNOSIS — E1122 Type 2 diabetes mellitus with diabetic chronic kidney disease: Secondary | ICD-10-CM | POA: Diagnosis not present

## 2019-08-20 DIAGNOSIS — E1121 Type 2 diabetes mellitus with diabetic nephropathy: Secondary | ICD-10-CM | POA: Diagnosis not present

## 2019-08-21 DIAGNOSIS — E1121 Type 2 diabetes mellitus with diabetic nephropathy: Secondary | ICD-10-CM | POA: Diagnosis not present

## 2019-08-21 DIAGNOSIS — I25700 Atherosclerosis of coronary artery bypass graft(s), unspecified, with unstable angina pectoris: Secondary | ICD-10-CM | POA: Diagnosis not present

## 2019-08-21 DIAGNOSIS — I5032 Chronic diastolic (congestive) heart failure: Secondary | ICD-10-CM | POA: Diagnosis not present

## 2019-08-21 DIAGNOSIS — N183 Chronic kidney disease, stage 3 unspecified: Secondary | ICD-10-CM | POA: Diagnosis not present

## 2019-08-21 DIAGNOSIS — I130 Hypertensive heart and chronic kidney disease with heart failure and stage 1 through stage 4 chronic kidney disease, or unspecified chronic kidney disease: Secondary | ICD-10-CM | POA: Diagnosis not present

## 2019-08-21 DIAGNOSIS — E1122 Type 2 diabetes mellitus with diabetic chronic kidney disease: Secondary | ICD-10-CM | POA: Diagnosis not present

## 2019-08-22 DIAGNOSIS — E1122 Type 2 diabetes mellitus with diabetic chronic kidney disease: Secondary | ICD-10-CM | POA: Diagnosis not present

## 2019-08-22 DIAGNOSIS — N183 Chronic kidney disease, stage 3 unspecified: Secondary | ICD-10-CM | POA: Diagnosis not present

## 2019-08-22 DIAGNOSIS — I5032 Chronic diastolic (congestive) heart failure: Secondary | ICD-10-CM | POA: Diagnosis not present

## 2019-08-22 DIAGNOSIS — E1121 Type 2 diabetes mellitus with diabetic nephropathy: Secondary | ICD-10-CM | POA: Diagnosis not present

## 2019-08-22 DIAGNOSIS — I130 Hypertensive heart and chronic kidney disease with heart failure and stage 1 through stage 4 chronic kidney disease, or unspecified chronic kidney disease: Secondary | ICD-10-CM | POA: Diagnosis not present

## 2019-08-22 DIAGNOSIS — I25700 Atherosclerosis of coronary artery bypass graft(s), unspecified, with unstable angina pectoris: Secondary | ICD-10-CM | POA: Diagnosis not present

## 2019-08-23 DIAGNOSIS — I130 Hypertensive heart and chronic kidney disease with heart failure and stage 1 through stage 4 chronic kidney disease, or unspecified chronic kidney disease: Secondary | ICD-10-CM | POA: Diagnosis not present

## 2019-08-23 DIAGNOSIS — I5032 Chronic diastolic (congestive) heart failure: Secondary | ICD-10-CM | POA: Diagnosis not present

## 2019-08-23 DIAGNOSIS — E1121 Type 2 diabetes mellitus with diabetic nephropathy: Secondary | ICD-10-CM | POA: Diagnosis not present

## 2019-08-23 DIAGNOSIS — N183 Chronic kidney disease, stage 3 unspecified: Secondary | ICD-10-CM | POA: Diagnosis not present

## 2019-08-23 DIAGNOSIS — E1122 Type 2 diabetes mellitus with diabetic chronic kidney disease: Secondary | ICD-10-CM | POA: Diagnosis not present

## 2019-08-23 DIAGNOSIS — I25700 Atherosclerosis of coronary artery bypass graft(s), unspecified, with unstable angina pectoris: Secondary | ICD-10-CM | POA: Diagnosis not present

## 2019-08-24 DIAGNOSIS — I5032 Chronic diastolic (congestive) heart failure: Secondary | ICD-10-CM | POA: Diagnosis not present

## 2019-08-24 DIAGNOSIS — E1121 Type 2 diabetes mellitus with diabetic nephropathy: Secondary | ICD-10-CM | POA: Diagnosis not present

## 2019-08-24 DIAGNOSIS — I25700 Atherosclerosis of coronary artery bypass graft(s), unspecified, with unstable angina pectoris: Secondary | ICD-10-CM | POA: Diagnosis not present

## 2019-08-24 DIAGNOSIS — I130 Hypertensive heart and chronic kidney disease with heart failure and stage 1 through stage 4 chronic kidney disease, or unspecified chronic kidney disease: Secondary | ICD-10-CM | POA: Diagnosis not present

## 2019-08-24 DIAGNOSIS — E1122 Type 2 diabetes mellitus with diabetic chronic kidney disease: Secondary | ICD-10-CM | POA: Diagnosis not present

## 2019-08-24 DIAGNOSIS — N183 Chronic kidney disease, stage 3 unspecified: Secondary | ICD-10-CM | POA: Diagnosis not present

## 2019-08-25 DIAGNOSIS — E1121 Type 2 diabetes mellitus with diabetic nephropathy: Secondary | ICD-10-CM | POA: Diagnosis not present

## 2019-08-25 DIAGNOSIS — N183 Chronic kidney disease, stage 3 unspecified: Secondary | ICD-10-CM | POA: Diagnosis not present

## 2019-08-25 DIAGNOSIS — I25700 Atherosclerosis of coronary artery bypass graft(s), unspecified, with unstable angina pectoris: Secondary | ICD-10-CM | POA: Diagnosis not present

## 2019-08-25 DIAGNOSIS — I130 Hypertensive heart and chronic kidney disease with heart failure and stage 1 through stage 4 chronic kidney disease, or unspecified chronic kidney disease: Secondary | ICD-10-CM | POA: Diagnosis not present

## 2019-08-25 DIAGNOSIS — I5032 Chronic diastolic (congestive) heart failure: Secondary | ICD-10-CM | POA: Diagnosis not present

## 2019-08-25 DIAGNOSIS — E1122 Type 2 diabetes mellitus with diabetic chronic kidney disease: Secondary | ICD-10-CM | POA: Diagnosis not present

## 2019-08-26 DIAGNOSIS — I25700 Atherosclerosis of coronary artery bypass graft(s), unspecified, with unstable angina pectoris: Secondary | ICD-10-CM | POA: Diagnosis not present

## 2019-08-26 DIAGNOSIS — I130 Hypertensive heart and chronic kidney disease with heart failure and stage 1 through stage 4 chronic kidney disease, or unspecified chronic kidney disease: Secondary | ICD-10-CM | POA: Diagnosis not present

## 2019-08-26 DIAGNOSIS — N183 Chronic kidney disease, stage 3 unspecified: Secondary | ICD-10-CM | POA: Diagnosis not present

## 2019-08-26 DIAGNOSIS — E1122 Type 2 diabetes mellitus with diabetic chronic kidney disease: Secondary | ICD-10-CM | POA: Diagnosis not present

## 2019-08-26 DIAGNOSIS — E1121 Type 2 diabetes mellitus with diabetic nephropathy: Secondary | ICD-10-CM | POA: Diagnosis not present

## 2019-08-26 DIAGNOSIS — I5032 Chronic diastolic (congestive) heart failure: Secondary | ICD-10-CM | POA: Diagnosis not present

## 2019-08-27 DIAGNOSIS — E1122 Type 2 diabetes mellitus with diabetic chronic kidney disease: Secondary | ICD-10-CM | POA: Diagnosis not present

## 2019-08-27 DIAGNOSIS — I130 Hypertensive heart and chronic kidney disease with heart failure and stage 1 through stage 4 chronic kidney disease, or unspecified chronic kidney disease: Secondary | ICD-10-CM | POA: Diagnosis not present

## 2019-08-27 DIAGNOSIS — E1121 Type 2 diabetes mellitus with diabetic nephropathy: Secondary | ICD-10-CM | POA: Diagnosis not present

## 2019-08-27 DIAGNOSIS — I5032 Chronic diastolic (congestive) heart failure: Secondary | ICD-10-CM | POA: Diagnosis not present

## 2019-08-27 DIAGNOSIS — N183 Chronic kidney disease, stage 3 unspecified: Secondary | ICD-10-CM | POA: Diagnosis not present

## 2019-08-27 DIAGNOSIS — I25700 Atherosclerosis of coronary artery bypass graft(s), unspecified, with unstable angina pectoris: Secondary | ICD-10-CM | POA: Diagnosis not present

## 2019-08-28 DIAGNOSIS — N183 Chronic kidney disease, stage 3 unspecified: Secondary | ICD-10-CM | POA: Diagnosis not present

## 2019-08-28 DIAGNOSIS — I25700 Atherosclerosis of coronary artery bypass graft(s), unspecified, with unstable angina pectoris: Secondary | ICD-10-CM | POA: Diagnosis not present

## 2019-08-28 DIAGNOSIS — E1122 Type 2 diabetes mellitus with diabetic chronic kidney disease: Secondary | ICD-10-CM | POA: Diagnosis not present

## 2019-08-28 DIAGNOSIS — I130 Hypertensive heart and chronic kidney disease with heart failure and stage 1 through stage 4 chronic kidney disease, or unspecified chronic kidney disease: Secondary | ICD-10-CM | POA: Diagnosis not present

## 2019-08-28 DIAGNOSIS — I5032 Chronic diastolic (congestive) heart failure: Secondary | ICD-10-CM | POA: Diagnosis not present

## 2019-08-28 DIAGNOSIS — E1121 Type 2 diabetes mellitus with diabetic nephropathy: Secondary | ICD-10-CM | POA: Diagnosis not present

## 2019-08-29 DIAGNOSIS — I5032 Chronic diastolic (congestive) heart failure: Secondary | ICD-10-CM | POA: Diagnosis not present

## 2019-08-29 DIAGNOSIS — E1122 Type 2 diabetes mellitus with diabetic chronic kidney disease: Secondary | ICD-10-CM | POA: Diagnosis not present

## 2019-08-29 DIAGNOSIS — N183 Chronic kidney disease, stage 3 unspecified: Secondary | ICD-10-CM | POA: Diagnosis not present

## 2019-08-29 DIAGNOSIS — E1121 Type 2 diabetes mellitus with diabetic nephropathy: Secondary | ICD-10-CM | POA: Diagnosis not present

## 2019-08-29 DIAGNOSIS — I130 Hypertensive heart and chronic kidney disease with heart failure and stage 1 through stage 4 chronic kidney disease, or unspecified chronic kidney disease: Secondary | ICD-10-CM | POA: Diagnosis not present

## 2019-08-29 DIAGNOSIS — I25700 Atherosclerosis of coronary artery bypass graft(s), unspecified, with unstable angina pectoris: Secondary | ICD-10-CM | POA: Diagnosis not present

## 2019-08-30 DIAGNOSIS — E1121 Type 2 diabetes mellitus with diabetic nephropathy: Secondary | ICD-10-CM | POA: Diagnosis not present

## 2019-08-30 DIAGNOSIS — I130 Hypertensive heart and chronic kidney disease with heart failure and stage 1 through stage 4 chronic kidney disease, or unspecified chronic kidney disease: Secondary | ICD-10-CM | POA: Diagnosis not present

## 2019-08-30 DIAGNOSIS — N183 Chronic kidney disease, stage 3 unspecified: Secondary | ICD-10-CM | POA: Diagnosis not present

## 2019-08-30 DIAGNOSIS — I25700 Atherosclerosis of coronary artery bypass graft(s), unspecified, with unstable angina pectoris: Secondary | ICD-10-CM | POA: Diagnosis not present

## 2019-08-30 DIAGNOSIS — E1122 Type 2 diabetes mellitus with diabetic chronic kidney disease: Secondary | ICD-10-CM | POA: Diagnosis not present

## 2019-08-30 DIAGNOSIS — I5032 Chronic diastolic (congestive) heart failure: Secondary | ICD-10-CM | POA: Diagnosis not present

## 2019-08-31 DIAGNOSIS — I25700 Atherosclerosis of coronary artery bypass graft(s), unspecified, with unstable angina pectoris: Secondary | ICD-10-CM | POA: Diagnosis not present

## 2019-08-31 DIAGNOSIS — E1121 Type 2 diabetes mellitus with diabetic nephropathy: Secondary | ICD-10-CM | POA: Diagnosis not present

## 2019-08-31 DIAGNOSIS — I130 Hypertensive heart and chronic kidney disease with heart failure and stage 1 through stage 4 chronic kidney disease, or unspecified chronic kidney disease: Secondary | ICD-10-CM | POA: Diagnosis not present

## 2019-08-31 DIAGNOSIS — E1122 Type 2 diabetes mellitus with diabetic chronic kidney disease: Secondary | ICD-10-CM | POA: Diagnosis not present

## 2019-08-31 DIAGNOSIS — I5032 Chronic diastolic (congestive) heart failure: Secondary | ICD-10-CM | POA: Diagnosis not present

## 2019-08-31 DIAGNOSIS — N183 Chronic kidney disease, stage 3 unspecified: Secondary | ICD-10-CM | POA: Diagnosis not present

## 2019-09-01 DIAGNOSIS — I5032 Chronic diastolic (congestive) heart failure: Secondary | ICD-10-CM | POA: Diagnosis not present

## 2019-09-01 DIAGNOSIS — I25700 Atherosclerosis of coronary artery bypass graft(s), unspecified, with unstable angina pectoris: Secondary | ICD-10-CM | POA: Diagnosis not present

## 2019-09-01 DIAGNOSIS — E1122 Type 2 diabetes mellitus with diabetic chronic kidney disease: Secondary | ICD-10-CM | POA: Diagnosis not present

## 2019-09-01 DIAGNOSIS — E1121 Type 2 diabetes mellitus with diabetic nephropathy: Secondary | ICD-10-CM | POA: Diagnosis not present

## 2019-09-01 DIAGNOSIS — N183 Chronic kidney disease, stage 3 unspecified: Secondary | ICD-10-CM | POA: Diagnosis not present

## 2019-09-01 DIAGNOSIS — I130 Hypertensive heart and chronic kidney disease with heart failure and stage 1 through stage 4 chronic kidney disease, or unspecified chronic kidney disease: Secondary | ICD-10-CM | POA: Diagnosis not present

## 2019-09-02 DIAGNOSIS — E785 Hyperlipidemia, unspecified: Secondary | ICD-10-CM | POA: Diagnosis not present

## 2019-09-02 DIAGNOSIS — Z9981 Dependence on supplemental oxygen: Secondary | ICD-10-CM | POA: Diagnosis not present

## 2019-09-02 DIAGNOSIS — R627 Adult failure to thrive: Secondary | ICD-10-CM | POA: Diagnosis not present

## 2019-09-02 DIAGNOSIS — K047 Periapical abscess without sinus: Secondary | ICD-10-CM | POA: Diagnosis not present

## 2019-09-02 DIAGNOSIS — I25700 Atherosclerosis of coronary artery bypass graft(s), unspecified, with unstable angina pectoris: Secondary | ICD-10-CM | POA: Diagnosis not present

## 2019-09-02 DIAGNOSIS — R0601 Orthopnea: Secondary | ICD-10-CM | POA: Diagnosis not present

## 2019-09-02 DIAGNOSIS — K0381 Cracked tooth: Secondary | ICD-10-CM | POA: Diagnosis not present

## 2019-09-02 DIAGNOSIS — Z7901 Long term (current) use of anticoagulants: Secondary | ICD-10-CM | POA: Diagnosis not present

## 2019-09-02 DIAGNOSIS — I130 Hypertensive heart and chronic kidney disease with heart failure and stage 1 through stage 4 chronic kidney disease, or unspecified chronic kidney disease: Secondary | ICD-10-CM | POA: Diagnosis not present

## 2019-09-02 DIAGNOSIS — Z87440 Personal history of urinary (tract) infections: Secondary | ICD-10-CM | POA: Diagnosis not present

## 2019-09-02 DIAGNOSIS — Z7902 Long term (current) use of antithrombotics/antiplatelets: Secondary | ICD-10-CM | POA: Diagnosis not present

## 2019-09-02 DIAGNOSIS — R32 Unspecified urinary incontinence: Secondary | ICD-10-CM | POA: Diagnosis not present

## 2019-09-02 DIAGNOSIS — Z741 Need for assistance with personal care: Secondary | ICD-10-CM | POA: Diagnosis not present

## 2019-09-02 DIAGNOSIS — R634 Abnormal weight loss: Secondary | ICD-10-CM | POA: Diagnosis not present

## 2019-09-02 DIAGNOSIS — Z9181 History of falling: Secondary | ICD-10-CM | POA: Diagnosis not present

## 2019-09-02 DIAGNOSIS — Z993 Dependence on wheelchair: Secondary | ICD-10-CM | POA: Diagnosis not present

## 2019-09-02 DIAGNOSIS — R188 Other ascites: Secondary | ICD-10-CM | POA: Diagnosis not present

## 2019-09-02 DIAGNOSIS — J9611 Chronic respiratory failure with hypoxia: Secondary | ICD-10-CM | POA: Diagnosis not present

## 2019-09-02 DIAGNOSIS — I5032 Chronic diastolic (congestive) heart failure: Secondary | ICD-10-CM | POA: Diagnosis not present

## 2019-09-02 DIAGNOSIS — E1121 Type 2 diabetes mellitus with diabetic nephropathy: Secondary | ICD-10-CM | POA: Diagnosis not present

## 2019-09-02 DIAGNOSIS — M87050 Idiopathic aseptic necrosis of pelvis: Secondary | ICD-10-CM | POA: Diagnosis not present

## 2019-09-02 DIAGNOSIS — E1122 Type 2 diabetes mellitus with diabetic chronic kidney disease: Secondary | ICD-10-CM | POA: Diagnosis not present

## 2019-09-02 DIAGNOSIS — R230 Cyanosis: Secondary | ICD-10-CM | POA: Diagnosis not present

## 2019-09-02 DIAGNOSIS — Z7984 Long term (current) use of oral hypoglycemic drugs: Secondary | ICD-10-CM | POA: Diagnosis not present

## 2019-09-02 DIAGNOSIS — N183 Chronic kidney disease, stage 3 unspecified: Secondary | ICD-10-CM | POA: Diagnosis not present

## 2019-09-06 ENCOUNTER — Telehealth: Payer: Self-pay | Admitting: Family Medicine

## 2019-09-06 NOTE — Telephone Encounter (Signed)
Spoke with daughter [name] to express my condolences. She was appreciative.

## 2019-10-03 DEATH — deceased
